# Patient Record
Sex: Male | Born: 1940
Health system: Southern US, Community
[De-identification: ages and names within clinical notes are randomized; demographics above are authoritative.]

## PROBLEM LIST (undated history)

## (undated) DIAGNOSIS — I4891 Unspecified atrial fibrillation: Secondary | ICD-10-CM

## (undated) DIAGNOSIS — I779 Disorder of arteries and arterioles, unspecified: Secondary | ICD-10-CM

## (undated) DIAGNOSIS — E785 Hyperlipidemia, unspecified: Secondary | ICD-10-CM

## (undated) DIAGNOSIS — I255 Ischemic cardiomyopathy: Secondary | ICD-10-CM

## (undated) DIAGNOSIS — I2510 Atherosclerotic heart disease of native coronary artery without angina pectoris: Secondary | ICD-10-CM

## (undated) DIAGNOSIS — Z87442 Personal history of urinary calculi: Secondary | ICD-10-CM

## (undated) DIAGNOSIS — I10 Essential (primary) hypertension: Secondary | ICD-10-CM

## (undated) DIAGNOSIS — I351 Nonrheumatic aortic (valve) insufficiency: Secondary | ICD-10-CM

## (undated) DIAGNOSIS — E119 Type 2 diabetes mellitus without complications: Secondary | ICD-10-CM

## (undated) DIAGNOSIS — I9789 Other postprocedural complications and disorders of the circulatory system, not elsewhere classified: Secondary | ICD-10-CM

## (undated) DIAGNOSIS — I493 Ventricular premature depolarization: Secondary | ICD-10-CM

## (undated) DIAGNOSIS — K219 Gastro-esophageal reflux disease without esophagitis: Secondary | ICD-10-CM

## (undated) DIAGNOSIS — IMO0001 Reserved for inherently not codable concepts without codable children: Secondary | ICD-10-CM

## (undated) DIAGNOSIS — K589 Irritable bowel syndrome without diarrhea: Secondary | ICD-10-CM

## (undated) DIAGNOSIS — I340 Nonrheumatic mitral (valve) insufficiency: Secondary | ICD-10-CM

## (undated) DIAGNOSIS — N183 Chronic kidney disease, stage 3 unspecified: Secondary | ICD-10-CM

## (undated) DIAGNOSIS — Z9289 Personal history of other medical treatment: Secondary | ICD-10-CM

## (undated) DIAGNOSIS — C801 Malignant (primary) neoplasm, unspecified: Secondary | ICD-10-CM

## (undated) DIAGNOSIS — I350 Nonrheumatic aortic (valve) stenosis: Secondary | ICD-10-CM

## (undated) DIAGNOSIS — I5032 Chronic diastolic (congestive) heart failure: Secondary | ICD-10-CM

## (undated) HISTORY — DX: Unspecified atrial fibrillation: I97.89

## (undated) HISTORY — DX: Ischemic cardiomyopathy: I25.5

## (undated) HISTORY — DX: Personal history of other medical treatment: Z92.89

## (undated) HISTORY — DX: Nonrheumatic mitral (valve) insufficiency: I34.0

## (undated) HISTORY — DX: Irritable bowel syndrome, unspecified: K58.9

## (undated) HISTORY — DX: Disorder of arteries and arterioles, unspecified: I77.9

## (undated) HISTORY — DX: Unspecified atrial fibrillation: I48.91

## (undated) HISTORY — DX: Ventricular premature depolarization: I49.3

## (undated) HISTORY — DX: Nonrheumatic aortic (valve) insufficiency: I35.1

## (undated) HISTORY — DX: Essential (primary) hypertension: I10

## (undated) HISTORY — PX: NASAL FRACTURE SURGERY: SHX718

## (undated) HISTORY — PX: CARDIOVERSION: SHX1299

## (undated) HISTORY — DX: Reserved for inherently not codable concepts without codable children: IMO0001

## (undated) HISTORY — DX: Gastro-esophageal reflux disease without esophagitis: K21.9

---

## 2001-03-22 ENCOUNTER — Ambulatory Visit (HOSPITAL_COMMUNITY): Admission: RE | Admit: 2001-03-22 | Discharge: 2001-03-22 | Payer: Self-pay | Admitting: Internal Medicine

## 2001-03-22 ENCOUNTER — Encounter: Payer: Self-pay | Admitting: Internal Medicine

## 2001-09-03 ENCOUNTER — Encounter: Payer: Self-pay | Admitting: Family Medicine

## 2001-09-03 ENCOUNTER — Ambulatory Visit (HOSPITAL_COMMUNITY): Admission: RE | Admit: 2001-09-03 | Discharge: 2001-09-03 | Payer: Self-pay | Admitting: Family Medicine

## 2002-03-24 ENCOUNTER — Encounter: Payer: Self-pay | Admitting: Family Medicine

## 2002-03-24 ENCOUNTER — Ambulatory Visit (HOSPITAL_COMMUNITY): Admission: RE | Admit: 2002-03-24 | Discharge: 2002-03-24 | Payer: Self-pay | Admitting: Family Medicine

## 2002-10-26 ENCOUNTER — Encounter: Payer: Self-pay | Admitting: Family Medicine

## 2002-10-26 ENCOUNTER — Ambulatory Visit (HOSPITAL_COMMUNITY): Admission: RE | Admit: 2002-10-26 | Discharge: 2002-10-26 | Payer: Self-pay | Admitting: Family Medicine

## 2002-12-01 DIAGNOSIS — Z87442 Personal history of urinary calculi: Secondary | ICD-10-CM

## 2002-12-01 HISTORY — DX: Personal history of urinary calculi: Z87.442

## 2004-03-25 ENCOUNTER — Ambulatory Visit (HOSPITAL_COMMUNITY): Admission: RE | Admit: 2004-03-25 | Discharge: 2004-03-25 | Payer: Self-pay | Admitting: Family Medicine

## 2004-03-28 ENCOUNTER — Ambulatory Visit (HOSPITAL_COMMUNITY): Admission: RE | Admit: 2004-03-28 | Discharge: 2004-03-28 | Payer: Self-pay | Admitting: Family Medicine

## 2007-12-28 ENCOUNTER — Ambulatory Visit (HOSPITAL_COMMUNITY): Admission: RE | Admit: 2007-12-28 | Discharge: 2007-12-28 | Payer: Self-pay | Admitting: Family Medicine

## 2008-04-10 ENCOUNTER — Ambulatory Visit (HOSPITAL_COMMUNITY): Admission: RE | Admit: 2008-04-10 | Discharge: 2008-04-10 | Payer: Self-pay | Admitting: Family Medicine

## 2008-07-05 ENCOUNTER — Ambulatory Visit (HOSPITAL_COMMUNITY): Admission: RE | Admit: 2008-07-05 | Discharge: 2008-07-05 | Payer: Self-pay | Admitting: Family Medicine

## 2010-12-22 ENCOUNTER — Encounter: Payer: Self-pay | Admitting: Family Medicine

## 2011-02-06 ENCOUNTER — Ambulatory Visit (INDEPENDENT_AMBULATORY_CARE_PROVIDER_SITE_OTHER): Payer: Medicare Other | Admitting: Internal Medicine

## 2011-02-06 DIAGNOSIS — R1032 Left lower quadrant pain: Secondary | ICD-10-CM

## 2011-02-06 DIAGNOSIS — R1031 Right lower quadrant pain: Secondary | ICD-10-CM

## 2011-02-06 DIAGNOSIS — Z7982 Long term (current) use of aspirin: Secondary | ICD-10-CM

## 2011-02-26 ENCOUNTER — Other Ambulatory Visit (INDEPENDENT_AMBULATORY_CARE_PROVIDER_SITE_OTHER): Payer: Self-pay | Admitting: Internal Medicine

## 2011-02-26 ENCOUNTER — Encounter (HOSPITAL_BASED_OUTPATIENT_CLINIC_OR_DEPARTMENT_OTHER): Payer: Medicare Other | Admitting: Internal Medicine

## 2011-02-26 ENCOUNTER — Ambulatory Visit (HOSPITAL_COMMUNITY)
Admission: RE | Admit: 2011-02-26 | Discharge: 2011-02-26 | Disposition: A | Discharge status: Home / Self Care | Payer: Medicare Other | Source: Ambulatory Visit | Attending: Internal Medicine | Admitting: Internal Medicine

## 2011-02-26 DIAGNOSIS — Z1211 Encounter for screening for malignant neoplasm of colon: Secondary | ICD-10-CM

## 2011-02-26 DIAGNOSIS — Z7982 Long term (current) use of aspirin: Secondary | ICD-10-CM | POA: Insufficient documentation

## 2011-02-26 DIAGNOSIS — D126 Benign neoplasm of colon, unspecified: Secondary | ICD-10-CM | POA: Insufficient documentation

## 2011-02-26 DIAGNOSIS — K5730 Diverticulosis of large intestine without perforation or abscess without bleeding: Secondary | ICD-10-CM

## 2011-02-26 DIAGNOSIS — E785 Hyperlipidemia, unspecified: Secondary | ICD-10-CM | POA: Insufficient documentation

## 2011-02-26 DIAGNOSIS — R109 Unspecified abdominal pain: Secondary | ICD-10-CM

## 2011-02-26 DIAGNOSIS — Z79899 Other long term (current) drug therapy: Secondary | ICD-10-CM | POA: Insufficient documentation

## 2011-02-26 DIAGNOSIS — R1031 Right lower quadrant pain: Secondary | ICD-10-CM | POA: Insufficient documentation

## 2011-02-26 DIAGNOSIS — I10 Essential (primary) hypertension: Secondary | ICD-10-CM | POA: Insufficient documentation

## 2011-02-26 DIAGNOSIS — E119 Type 2 diabetes mellitus without complications: Secondary | ICD-10-CM | POA: Insufficient documentation

## 2011-03-09 NOTE — Op Note (Signed)
  [name], [name]                ACCOUNT NO.:  [id number]  MEDICAL RECORD NO.:  [id number]           PATIENT TYPE:  O  LOCATION:  DAYP                          FACILITY:  [facility]  PHYSICIAN:  Lionel December, M.D.    DATE OF BIRTH:  24-Jan-1941  DATE OF PROCEDURE:  02/26/2011 DATE OF DISCHARGE:                              OPERATIVE REPORT   PROCEDURE:  Colonoscopy.  INDICATION:  Alejandro [name] is a 71 year old Caucasian male who has been experiencing lower abdominal pain over the last 3 months.  He also gives history of tarry stools but recently when he was seen in the office, stool was guaiac negative.  He also had stool studies which were negative except for a positive lactoferrin.  The patient's last colonoscopy was over 10 years ago.  He is undergoing colonoscopy for diagnostic and screening purposes.  Procedures were reviewed with the patient and informed consent was obtained.  MEDS FOR CONSCIOUS SEDATION:  Demerol 50 mg IV, Versed 4 mg IV.  FINDINGS:  Procedure performed in endoscopy suite.  The patient's vital signs and O2 sat were monitored during the procedure and remained stable.  The patient was placed in left lateral recumbent position and rectal examination performed.  No abnormality noted on external or digital exam.  Pentax videoscope was placed through the rectum and advanced under vision into sigmoid colon beyond.  Preparation was excellent.  He had two tiny diverticula at sigmoid colon.  Scope was passed into the cecum which was identified by appendiceal orifice and ileocecal valve.  Pictures taken for the record.  There were three small polyps at cecum, 2 were ablated via cold biopsy and submitted one container.  The other polyp was somewhat larger sessile ablated via cold biopsy and submitted in separate container.  As the scope was withdrawn, colonic mucosa was once again carefully examined.  There two more polyps at hepatic flexure, which was ablated via cold biopsy  and submitted in one container.  Mucosa, rest of the colon was normal.  Rectal mucosa similarly was normal.  Scope was retroflexed to examine, anorectal junction which was unremarkable.  Endoscope was then withdrawn. Withdrawal time was 7 minutes.  FINAL DIAGNOSIS: 1. Examination performed to cecum. 2. Five small polyps ablated via cold biopsy, three of these were at     cecum, rest at hepatic flexure.  Two small cecal polyps were     submitted one container and similarly polyps at hepatic flexure     submitted in one container.  RECOMMENDATIONS:  A standard instructions given.  Dicyclomine 10 mg before breakfast and lunch.  Prescription given for 60 with one refill.  I will be contacting the patient's results of biopsy and further recommendations.     Lionel December, M.D.     NR/MEDQ  D:  02/26/2011  T:  02/26/2011  Job:  [id number]  cc:   Catalina Pizza, M.D. Fax: [phone]  Electronically Signed by Lionel December M.D. on 03/09/2011 02:03:48 PM

## 2011-03-09 NOTE — Consult Note (Signed)
NAMEBRAXSTON, Alejandro [name]                ACCOUNT NO.:  [id number]  MEDICAL RECORD NO.:  [id number]           PATIENT TYPE:  LOCATION:                                 FACILITY:  PHYSICIAN:  Lionel December, M.D.    DATE OF BIRTH:  [birth year]  DATE OF CONSULTATION: DATE OF DISCHARGE:                                CONSULTATION   REASON FOR CONSULT:  Abdominal pain.  HISTORY OF PRESENT ILLNESS:  Alejandro [name] is a 70 year old white male presenting today with complaints that in December, he ate fish in [place], and began to have lower abdominal pain.  He denied having any diarrhea in December.  He states his stools 2 weeks ago were black.  He says he has been having black stools for about 2 weeks.  He also complained of burping which actually relieved some of his lower abdominal pain.  He says if he eats chicken noodle soup, he has no problems.  He does complain of left lower abdominal pain and pain at the umbilicus.  He feels better when he takes Metamucil.  His appetite has been good.  He has lost weight, and this has been intentional.  He usually has a bowel movement one a day or every 2 days.  His stools are normal, normal size. His lower abdominal pain is also relieved when he has a bowel movement. His acid reflux is controlled with Nexium.  He says he can eat anything. Alejandro [name] denied diarrhea. He did have stool studies in December, his O's and P's, C and S, and C. difficile were all negative.  He did have a positive lactoferritin.  There are no known allergies.  SURGERY:  He had a nasal fracture repaired years ago.  He has a history of IBS, claustrophobia, hypertension, high cholesterol, he has been a diabetic for 3 years.  FAMILY HISTORY:  His mother is deceased from complications of diabetes. His father is deceased from MI at age 62, two sisters, their health is unknown, but they are alive; two brothers both has had heart attacks. He is married.  He is self-employed in a [employer].  He does not smoke, drink, or do drugs.  He has one child who has had an MI twice.  HOME MEDICATIONS: 1. Diovan 321 a day. 2. Trilipix 135 mg a day. 3. Bystolic 5 mg a day. 4. Aspirin 81 mg a day. 5. Janumet 1 twice a day. 6. Nexium 40 mg a day. 7. Diazepam 5 mg as needed. 8. Aleve as needed.  OBJECTIVE:  VITAL SIGNS:  His weight is 284, his height is 5 feet 11 inches, his temp is 97.8, his blood pressure is 182/100, and his pulse is 80. HEENT:  His conjunctivae is pink.  His sclerae anicteric.  His thyroid is normal.  There is no cervical lymphadenopathy. LUNGS:  Clear. HEART:  Regular rate and rhythm. ABDOMEN:  Obese, soft.  Bowel sounds are positive.  No tenderness at this time.  No mass is felt.  His stool is very dark, but it was guaiac negative.  ASSESSMENT:  Alejandro [name] is a 70 year old male  presenting today with lower abdominal pain.  He states he has had black stools for the past 2 weeks, but he actually has had no upper GI symptoms.  He says his acid reflux is controlled with Nexium and he can eat anything he wants.  He does have lower abdominal pain particularly on the left.  An inflammatory disease needs to be ruled out.  There is no diarrhea at this time that he has told me.  RECOMMENDATIONS:  We will schedule a colonoscopy with Dr. Karilyn Cota in the near future and the risk and benefits were reviewed with the patient and he is agreeable and he says his last colonoscopy was over 10 years ago and I will try to find that report.    ______________________________ Alejandro Ar, NP   ______________________________ Lionel December, M.D.    TS/MEDQ  D:  02/07/2011  T:  02/08/2011  Job:  [id number]  Electronically Signed by Alejandro Ar PA on 03/04/2011 08:39:04 AM Electronically Signed by Lionel December M.D. on 03/09/2011 02:03:43 PM

## 2011-06-18 ENCOUNTER — Ambulatory Visit (INDEPENDENT_AMBULATORY_CARE_PROVIDER_SITE_OTHER): Payer: Medicare Other | Admitting: Internal Medicine

## 2011-07-03 ENCOUNTER — Ambulatory Visit (INDEPENDENT_AMBULATORY_CARE_PROVIDER_SITE_OTHER): Payer: Medicare Other | Admitting: Internal Medicine

## 2011-07-03 VITALS — BP 162/80 | HR 68 | Temp 97.1°F | Ht 67.0 in | Wt 279.0 lb

## 2011-07-03 DIAGNOSIS — K219 Gastro-esophageal reflux disease without esophagitis: Secondary | ICD-10-CM

## 2011-07-03 NOTE — Progress Notes (Signed)
Subjective:     Patient ID: Alejandro [name], male   DOB: [birth year], 70 y.o.   MRN: [id number]  HPIHe presents today with c/o. He says he cannot any red meat.  He will become nauseated and he will cramps in his stomach.  If he eats a salad his mouth will break out and will cause nausea.  Symptoms x 1 month. If he eats breakfast in about 2 hrs he will become nauseated.  15 pound weight loss since his last visit. He does tell me he is trying to loose weight. No acid reflux.  BM x  4 a day. No melena or bright red bleeding.    Colonoscopy 02/26/2011 Dr. Karilyn Cota lower abdominal pain: Biopsy 5 small tubular adenomas. TCS in 5 yrs. Review of Systems     Objective:   Physical Exam Alert and oriented. Skin warm and dry. Oral mucosa is moist. Natural teeth in good condition. Sclera anicteric, conjunctivae is pink. Thyroid not enlarged. No cervical lymphadenopathy. Lungs clear. Heart regular rate and rhythm.  Abdomen is soft. Bowel sounds are positive Tenderness to epigastric region.. No hepatomegaly Very obese.. No abdominal masses felt. Patient is alert and oriented.     Assessment:    GERD.  Symptoms may also suggest gastroparesis given hx of diabetes    Plan:     Will give samples of Dexilant and GERD diet.  Progress report in 2 weeks. If not better will proceed with an EGD.

## 2012-01-08 ENCOUNTER — Encounter (INDEPENDENT_AMBULATORY_CARE_PROVIDER_SITE_OTHER): Payer: Self-pay | Admitting: Internal Medicine

## 2012-01-08 ENCOUNTER — Encounter (INDEPENDENT_AMBULATORY_CARE_PROVIDER_SITE_OTHER): Payer: Self-pay | Admitting: *Deleted

## 2012-01-08 ENCOUNTER — Ambulatory Visit (INDEPENDENT_AMBULATORY_CARE_PROVIDER_SITE_OTHER): Payer: Medicare Other | Admitting: Internal Medicine

## 2012-01-08 ENCOUNTER — Other Ambulatory Visit (INDEPENDENT_AMBULATORY_CARE_PROVIDER_SITE_OTHER): Payer: Self-pay | Admitting: *Deleted

## 2012-01-08 VITALS — BP 154/86 | HR 80 | Temp 97.9°F | Ht 73.0 in | Wt 282.0 lb

## 2012-01-08 DIAGNOSIS — K219 Gastro-esophageal reflux disease without esophagitis: Secondary | ICD-10-CM | POA: Diagnosis not present

## 2012-01-08 DIAGNOSIS — E119 Type 2 diabetes mellitus without complications: Secondary | ICD-10-CM | POA: Insufficient documentation

## 2012-01-08 DIAGNOSIS — R1013 Epigastric pain: Secondary | ICD-10-CM | POA: Diagnosis not present

## 2012-01-08 DIAGNOSIS — I10 Essential (primary) hypertension: Secondary | ICD-10-CM | POA: Insufficient documentation

## 2012-01-08 DIAGNOSIS — K921 Melena: Secondary | ICD-10-CM | POA: Diagnosis not present

## 2012-01-08 DIAGNOSIS — E7800 Pure hypercholesterolemia, unspecified: Secondary | ICD-10-CM | POA: Insufficient documentation

## 2012-01-08 NOTE — Patient Instructions (Signed)
Will schedule an EGD with Dr Rehman.  

## 2012-01-08 NOTE — Progress Notes (Signed)
Subjective:     Patient ID: Alejandro [name], male   DOB: 12-18-1940, 71 y.o.   MRN: [id number]  HPI  Alejandro [name]  Is here today for f/u. He tells me today his stools are black. He says his stools were black x 2 days about 2 weeks ago.  He said he ate fried apple pies and that upset his stomach. He says he had epigastric pain and pain left upper abdomen. His mid abdomen feels like it is burning.  He is taking Dexilant which helps. Appetite good. No weight loss. Usually has a BM about once a day. Stools are green now in color. He does have epigastric and left mid abdominal pain.   He denies dysphagia  Colonosocpy 01/2011: Tubular adenoma.  Negative for high agrade dysplasia. ( For hx of lower abdominal pain. He gave a hx of tarry stools but this resolved)  Review of Systems See hpi Current Outpatient Prescriptions  Medication Sig Dispense Refill  . aspirin 81 MG tablet Take 81 mg by mouth daily.        Marland Kitchen dexlansoprazole (DEXILANT) 60 MG capsule Take 60 mg by mouth daily.      . naproxen sodium (ANAPROX) 220 MG tablet Take 220 mg by mouth as needed.        . nebivolol (BYSTOLIC) 5 MG tablet Take 5 mg by mouth daily.        . sitaGLIPtan-metformin (JANUMET) 50-1000 MG per tablet Take 1 tablet by mouth 2 (two) times daily with a meal.        . valsartan (DIOVAN) 320 MG tablet Take 320 mg by mouth daily.        . diazepam (VALIUM) 5 MG tablet Take 5 mg by mouth every 6 (six) hours as needed.        Marland Kitchen esomeprazole (NEXIUM) 40 MG capsule Take 40 mg by mouth daily before breakfast.         No family status information on file.       Objective:   Physical Exam Filed Vitals:   01/08/12 1132  Height: 6\' 1"  (1.854 m)  Weight: 282 lb (127.914 kg)    Alert and oriented. Skin warm and dry. Oral mucosa is moist.   . Sclera anicteric, conjunctivae is pink. Thyroid not enlarged. No cervical lymphadenopathy. Lungs clear. Heart regular rate and rhythm.  Abdomen is soft. Bowel sounds are positive. Abdomen is obese.  Unable to palpate liver. No abdominal masses felt. No tenderness.  No edema to lower extremities. Patient is alert and oriented.      Assessment:   PUD needs to be ruled out.     Plan:    Will schedule and EGD with Dr. Karilyn Cota. Gerd diet reviewed with patient.

## 2012-02-02 ENCOUNTER — Encounter (HOSPITAL_COMMUNITY): Payer: Self-pay | Admitting: Pharmacy Technician

## 2012-02-03 MED ORDER — SODIUM CHLORIDE 0.45 % IV SOLN
Freq: Once | INTRAVENOUS | Status: AC
  Administered 2012-02-04: 1000 mL via INTRAVENOUS

## 2012-02-04 ENCOUNTER — Encounter (HOSPITAL_COMMUNITY): Payer: Self-pay | Admitting: *Deleted

## 2012-02-04 ENCOUNTER — Encounter (HOSPITAL_COMMUNITY)
Admission: RE | Disposition: A | Discharge status: Home / Self Care | Payer: Self-pay | Source: Ambulatory Visit | Attending: Internal Medicine

## 2012-02-04 ENCOUNTER — Ambulatory Visit (HOSPITAL_COMMUNITY)
Admission: RE | Admit: 2012-02-04 | Discharge: 2012-02-04 | Disposition: A | Discharge status: Home / Self Care | Payer: Medicare Other | Source: Ambulatory Visit | Attending: Internal Medicine | Admitting: Internal Medicine

## 2012-02-04 DIAGNOSIS — E7800 Pure hypercholesterolemia, unspecified: Secondary | ICD-10-CM | POA: Diagnosis not present

## 2012-02-04 DIAGNOSIS — K219 Gastro-esophageal reflux disease without esophagitis: Secondary | ICD-10-CM | POA: Diagnosis not present

## 2012-02-04 DIAGNOSIS — E119 Type 2 diabetes mellitus without complications: Secondary | ICD-10-CM | POA: Insufficient documentation

## 2012-02-04 DIAGNOSIS — K228 Other specified diseases of esophagus: Secondary | ICD-10-CM

## 2012-02-04 DIAGNOSIS — R1013 Epigastric pain: Secondary | ICD-10-CM

## 2012-02-04 DIAGNOSIS — K208 Other esophagitis: Secondary | ICD-10-CM

## 2012-02-04 DIAGNOSIS — Z01812 Encounter for preprocedural laboratory examination: Secondary | ICD-10-CM | POA: Insufficient documentation

## 2012-02-04 DIAGNOSIS — K921 Melena: Secondary | ICD-10-CM

## 2012-02-04 DIAGNOSIS — I10 Essential (primary) hypertension: Secondary | ICD-10-CM | POA: Insufficient documentation

## 2012-02-04 DIAGNOSIS — K6389 Other specified diseases of intestine: Secondary | ICD-10-CM

## 2012-02-04 DIAGNOSIS — K2940 Chronic atrophic gastritis without bleeding: Secondary | ICD-10-CM | POA: Diagnosis not present

## 2012-02-04 HISTORY — PX: ESOPHAGOGASTRODUODENOSCOPY: SHX5428

## 2012-02-04 LAB — GLUCOSE, CAPILLARY: Glucose-Capillary: 110 mg/dL — ABNORMAL HIGH (ref 70–99)

## 2012-02-04 SURGERY — EGD (ESOPHAGOGASTRODUODENOSCOPY)
Anesthesia: Moderate Sedation

## 2012-02-04 MED ORDER — MIDAZOLAM HCL 5 MG/5ML IJ SOLN
INTRAMUSCULAR | Status: AC
  Filled 2012-02-04: qty 10

## 2012-02-04 MED ORDER — MIDAZOLAM HCL 5 MG/5ML IJ SOLN
INTRAMUSCULAR | Status: DC | PRN
  Administered 2012-02-04: 1 mg via INTRAVENOUS
  Administered 2012-02-04 (×3): 2 mg via INTRAVENOUS

## 2012-02-04 MED ORDER — MEPERIDINE HCL 50 MG/ML IJ SOLN
INTRAMUSCULAR | Status: AC
  Filled 2012-02-04: qty 1

## 2012-02-04 MED ORDER — BUTAMBEN-TETRACAINE-BENZOCAINE 2-2-14 % EX AERO
INHALATION_SPRAY | CUTANEOUS | Status: DC | PRN
  Administered 2012-02-04: 1 via TOPICAL

## 2012-02-04 MED ORDER — MEPERIDINE HCL 25 MG/ML IJ SOLN
INTRAMUSCULAR | Status: DC | PRN
  Administered 2012-02-04 (×2): 25 mg via INTRAVENOUS

## 2012-02-04 MED ORDER — STERILE WATER FOR IRRIGATION IR SOLN
Status: DC | PRN
  Administered 2012-02-04: 13:00:00

## 2012-02-04 NOTE — Discharge Instructions (Signed)
Resume usual medications and diet. No over-the-counter Arthritis medications. No driving for [id number]. Physician will contact you with results of biopsy and blood work. Office will arrange for upper abdominal ultrasound.

## 2012-02-04 NOTE — Op Note (Signed)
EGD PROCEDURE REPORT  PATIENT:  Alejandro [name]  MR#:  [id number] Birthdate:  Jul 16, 1941, 71 y.o., male Endoscopist:  Dr. Malissa Hippo, MD Referred By:  Dr. Dwana Melena, MD. Procedure Date: 02/04/2012  Procedure:   EGD.  Indications:  Patient is 71 year old Caucasian male to experience 2 episodes of melena recently and also has intermittent epigastric pain. He is undergoing diagnostic EGD.            Informed Consent:  Procedure and risks were reviewed with the patient and informed consent was obtained.  Medications:  Demerol 50 mg IV Versed 7 mg IV Cetacaine spray topically for oropharyngeal anesthesia  Description of procedure:  The endoscope was introduced through the mouth and advanced to the second portion of the duodenum without difficulty or limitations. The mucosal surfaces were surveyed very carefully during advancement of the scope and upon withdrawal.  Findings:  Esophagus:  Mucosa of the esophagus was normal. Wavy GE junction but no erosions or stricture noted. GEJ:  42 cm Stomach:  Stomach was empty and distended very well with insufflation. Folds in the proximal stomach were normal. Examination of the mucosa revealed patchy area edema and was a pattern in gastric body. Multiple antral erosions were noted in the background of raised,edematous mucosa but no ulcer crater identified. Angularis, fundus and cardia examined by retroflexing the scope and were unremarkable. No gastric varices identified. Duodenum:  Bulbar mucosa was normal. There appeared to be areas with flat mucosa involving second part of the duodenum. Biopsy was taken from these areas for routine histology.  Therapeutic/Diagnostic Maneuvers Performed:  See above  Complications:  None  Impression: Mucosal changes involving gastric body suspicious for portal gastropathy. Erosive antral gastritis. Patchy flattening to post bulbar mucosa. See taken for routine histology.  Recommendations:  Continue PPI. No OTC  NSAIDs. H. pylori serology. Hepatobiliary ultrasound.  Niemah Schwebke U  02/04/2012  1:16 PM  CC: Dr. Dwana Melena, MD, MD & Dr. Bonnetta Barry ref. provider found

## 2012-02-04 NOTE — H&P (Signed)
This is an update to history and physical from 01/08/2012. Patient has not experienced any more episodes of melena. He does complain of intermittent soreness in both iliac fossae as well as midabdomen when he has this symptom he does not have any nausea vomiting diarrhea fever or chills. His last colonoscopy was in March 2012 with removal of multiple small tubular adenomas(5). He is undergoing diagnostic EGD.

## 2012-02-05 LAB — H. PYLORI ANTIBODY, IGG: H Pylori IgG: >8 {ISR} — ABNORMAL HIGH

## 2012-02-06 ENCOUNTER — Other Ambulatory Visit (INDEPENDENT_AMBULATORY_CARE_PROVIDER_SITE_OTHER): Payer: Self-pay | Admitting: *Deleted

## 2012-02-06 DIAGNOSIS — R1013 Epigastric pain: Secondary | ICD-10-CM

## 2012-02-09 ENCOUNTER — Ambulatory Visit (HOSPITAL_COMMUNITY): Payer: Medicare Other

## 2012-02-09 ENCOUNTER — Encounter (HOSPITAL_COMMUNITY): Payer: Self-pay | Admitting: Internal Medicine

## 2012-02-09 ENCOUNTER — Other Ambulatory Visit (INDEPENDENT_AMBULATORY_CARE_PROVIDER_SITE_OTHER): Payer: Self-pay | Admitting: Internal Medicine

## 2012-02-09 DIAGNOSIS — B9681 Helicobacter pylori [H. pylori] as the cause of diseases classified elsewhere: Secondary | ICD-10-CM

## 2012-02-09 MED ORDER — BIS SUBCIT-METRONID-TETRACYC 140-125-125 MG PO CAPS: 3.0000 | ORAL_CAPSULE | Freq: Three times a day (TID) | ORAL | Status: AC

## 2012-02-12 ENCOUNTER — Ambulatory Visit (HOSPITAL_COMMUNITY)
Admission: RE | Admit: 2012-02-12 | Discharge: 2012-02-12 | Disposition: A | Discharge status: Home / Self Care | Payer: Medicare Other | Source: Ambulatory Visit | Attending: Internal Medicine | Admitting: Internal Medicine

## 2012-02-12 DIAGNOSIS — R1013 Epigastric pain: Secondary | ICD-10-CM | POA: Diagnosis not present

## 2012-02-12 DIAGNOSIS — K7689 Other specified diseases of liver: Secondary | ICD-10-CM | POA: Insufficient documentation

## 2012-02-26 DIAGNOSIS — J069 Acute upper respiratory infection, unspecified: Secondary | ICD-10-CM | POA: Diagnosis not present

## 2012-04-02 DIAGNOSIS — IMO0001 Reserved for inherently not codable concepts without codable children: Secondary | ICD-10-CM | POA: Diagnosis not present

## 2012-04-02 DIAGNOSIS — E291 Testicular hypofunction: Secondary | ICD-10-CM | POA: Diagnosis not present

## 2012-04-27 DIAGNOSIS — E291 Testicular hypofunction: Secondary | ICD-10-CM | POA: Diagnosis not present

## 2012-05-18 DIAGNOSIS — E291 Testicular hypofunction: Secondary | ICD-10-CM | POA: Diagnosis not present

## 2012-06-08 DIAGNOSIS — E291 Testicular hypofunction: Secondary | ICD-10-CM | POA: Diagnosis not present

## 2012-06-21 DIAGNOSIS — M549 Dorsalgia, unspecified: Secondary | ICD-10-CM | POA: Diagnosis not present

## 2012-06-21 DIAGNOSIS — N419 Inflammatory disease of prostate, unspecified: Secondary | ICD-10-CM | POA: Diagnosis not present

## 2012-06-22 DIAGNOSIS — M549 Dorsalgia, unspecified: Secondary | ICD-10-CM | POA: Diagnosis not present

## 2012-06-29 DIAGNOSIS — E291 Testicular hypofunction: Secondary | ICD-10-CM | POA: Diagnosis not present

## 2012-07-20 DIAGNOSIS — E291 Testicular hypofunction: Secondary | ICD-10-CM | POA: Diagnosis not present

## 2012-08-03 DIAGNOSIS — L259 Unspecified contact dermatitis, unspecified cause: Secondary | ICD-10-CM | POA: Diagnosis not present

## 2012-08-03 DIAGNOSIS — IMO0001 Reserved for inherently not codable concepts without codable children: Secondary | ICD-10-CM | POA: Diagnosis not present

## 2012-08-12 DIAGNOSIS — E291 Testicular hypofunction: Secondary | ICD-10-CM | POA: Diagnosis not present

## 2012-08-17 DIAGNOSIS — Z23 Encounter for immunization: Secondary | ICD-10-CM | POA: Diagnosis not present

## 2012-08-30 DIAGNOSIS — M779 Enthesopathy, unspecified: Secondary | ICD-10-CM | POA: Diagnosis not present

## 2012-08-30 DIAGNOSIS — I10 Essential (primary) hypertension: Secondary | ICD-10-CM | POA: Diagnosis not present

## 2012-09-02 ENCOUNTER — Encounter (INDEPENDENT_AMBULATORY_CARE_PROVIDER_SITE_OTHER): Payer: Self-pay | Admitting: Internal Medicine

## 2012-09-02 ENCOUNTER — Ambulatory Visit (INDEPENDENT_AMBULATORY_CARE_PROVIDER_SITE_OTHER): Payer: Medicare Other | Admitting: Internal Medicine

## 2012-09-02 VITALS — BP 160/80 | HR 64 | Temp 98.0°F | Ht 71.0 in | Wt 275.6 lb

## 2012-09-02 DIAGNOSIS — A048 Other specified bacterial intestinal infections: Secondary | ICD-10-CM | POA: Insufficient documentation

## 2012-09-02 DIAGNOSIS — K219 Gastro-esophageal reflux disease without esophagitis: Secondary | ICD-10-CM | POA: Diagnosis not present

## 2012-09-02 NOTE — Patient Instructions (Addendum)
OV in 1 yr;Marland Kitchen Continue Dexilant. If any problems, please call our office.

## 2012-09-02 NOTE — Progress Notes (Signed)
Subjective:     Patient ID: Alejandro [name], male   DOB: March 19, 1941, 71 y.o.   MRN: [id number]  HPIHere today for f/u of GERD.Feels pretty good. Appetite is good. No weight loss. Hx of H. Pylori positive. No epigastric pain. Usually has a BM about 2-3 a day. No melena or bright red rectal bleeding. Sometimes his stools are green.  02/04/2012 [id number]:[id number]:  Mucosal changes involving gastric body suspicious for portal gastropathy.  Erosive antral gastritis.  Patchy flattening to post bulbar mucosa. See taken for routine histology.  Biopsy:  Normal small bowel mucosa. H. pylori serology positive. Ultrasound is pending. Will start patient Pylera 3 capsules 4 times a day for 10 days.  US abdomen 02/06/2012 for epigastric pain: Fatty infiltration of the liver (hepatic steatosis).  Normal gallbladder. Negative for gallstones or biliary dilatation.     Review of Systems see hpi Current Outpatient Prescriptions  Medication Sig Dispense Refill  . aspirin EC 81 MG tablet Take 81 mg by mouth daily.      . cloNIDine (CATAPRES) 0.1 MG tablet Take 0.1 mg by mouth daily.      Marland Kitchen dexlansoprazole (DEXILANT) 60 MG capsule Take 60 mg by mouth daily.      . diazepam (VALIUM) 5 MG tablet Take 5 mg by mouth every 6 (six) hours as needed. For anxiety      . hydrALAZINE (APRESOLINE) 25 MG tablet Take 25 mg by mouth daily.      . nebivolol (BYSTOLIC) 5 MG tablet Take 5 mg by mouth daily.        . sitaGLIPtan-metformin (JANUMET) 50-1000 MG per tablet Take 1 tablet by mouth 2 (two) times daily with a meal.        . valsartan (DIOVAN) 320 MG tablet Take 320 mg by mouth daily.         Past Medical History  Diagnosis Date  . Diabetes mellitus   . Hypertension   . IBS (irritable colon syndrome)   . High cholesterol   . Hypertension   . Chronic kidney disease 2004    kidney stones   Past Surgical History  Procedure Date  . Nasal fx   . Esophagogastroduodenoscopy 02/04/2012    Procedure:  ESOPHAGOGASTRODUODENOSCOPY (EGD);  Surgeon: Malissa Hippo, MD;  Location: [facility];  Service: Endoscopy;  Laterality: N/A;  100   History   Social History  . Marital Status: Married    Spouse Name: N/A    Number of Children: N/A  . Years of Education: N/A   Occupational History  . Not on file.   Social History Main Topics  . Smoking status: Never Smoker   . Smokeless tobacco: Not on file  . Alcohol Use: No  . Drug Use: No  . Sexually Active: Yes   Other Topics Concern  . Not on file   Social History Narrative  . No narrative on file   Family Status  Relation Status Death Age  . Mother Deceased     Diabetes  . Father Deceased     MI  . Sister Alive     health unknown  . Brother Alive     Both have had MIs   No Known Allergies      Objective:   Physical Exam Filed Vitals:   09/02/12 0952  BP: 160/80  Pulse: 64  Temp: 98 F (36.7 C)  Height: 5\' 11"  (1.803 m)  Weight: 275 lb 9.6 oz (125.011 kg)   Alert and oriented.  Skin warm and dry. Oral mucosa is moist.   . Sclera anicteric, conjunctivae is pink. Thyroid not enlarged. No cervical lymphadenopathy. Lungs clear. Heart regular rate and rhythm.  Abdomen is soft. Bowel sounds are positive. No hepatomegaly. No abdominal masses felt. No tenderness.  No edema to lower extremities.       Assessment:    GERD. H pylori positive. Patient is asymptomatic . Feels good.    Plan:    f/u one year

## 2012-09-17 DIAGNOSIS — J069 Acute upper respiratory infection, unspecified: Secondary | ICD-10-CM | POA: Diagnosis not present

## 2012-09-22 DIAGNOSIS — E291 Testicular hypofunction: Secondary | ICD-10-CM | POA: Diagnosis not present

## 2012-09-22 DIAGNOSIS — J069 Acute upper respiratory infection, unspecified: Secondary | ICD-10-CM | POA: Diagnosis not present

## 2012-10-15 DIAGNOSIS — M62838 Other muscle spasm: Secondary | ICD-10-CM | POA: Diagnosis not present

## 2012-10-15 DIAGNOSIS — R51 Headache: Secondary | ICD-10-CM | POA: Diagnosis not present

## 2012-10-21 DIAGNOSIS — E291 Testicular hypofunction: Secondary | ICD-10-CM | POA: Diagnosis not present

## 2012-11-15 DIAGNOSIS — E291 Testicular hypofunction: Secondary | ICD-10-CM | POA: Diagnosis not present

## 2012-12-02 DIAGNOSIS — H9209 Otalgia, unspecified ear: Secondary | ICD-10-CM | POA: Diagnosis not present

## 2012-12-02 DIAGNOSIS — J019 Acute sinusitis, unspecified: Secondary | ICD-10-CM | POA: Diagnosis not present

## 2012-12-10 ENCOUNTER — Telehealth (INDEPENDENT_AMBULATORY_CARE_PROVIDER_SITE_OTHER): Payer: Self-pay | Admitting: Internal Medicine

## 2012-12-10 DIAGNOSIS — K219 Gastro-esophageal reflux disease without esophagitis: Secondary | ICD-10-CM

## 2012-12-10 MED ORDER — DEXLANSOPRAZOLE 60 MG PO CPDR: 60.0000 mg | DELAYED_RELEASE_CAPSULE | Freq: Every day | ORAL | Status: DC

## 2012-12-10 NOTE — Telephone Encounter (Signed)
Rx for dexilant eprescribed

## 2012-12-23 DIAGNOSIS — E291 Testicular hypofunction: Secondary | ICD-10-CM | POA: Diagnosis not present

## 2013-01-12 DIAGNOSIS — E291 Testicular hypofunction: Secondary | ICD-10-CM | POA: Diagnosis not present

## 2013-01-20 ENCOUNTER — Telehealth (INDEPENDENT_AMBULATORY_CARE_PROVIDER_SITE_OTHER): Payer: Self-pay | Admitting: *Deleted

## 2013-01-20 NOTE — Telephone Encounter (Signed)
Patient needed Dexilant to be approved. [employer] [facility] and they approved Dexilant 60 mg - Take 1 by mouth daily #30  From today, date until 11-30-13. [facility] called and made aware/Linda.

## 2013-02-01 DIAGNOSIS — E291 Testicular hypofunction: Secondary | ICD-10-CM | POA: Diagnosis not present

## 2013-02-02 ENCOUNTER — Telehealth (INDEPENDENT_AMBULATORY_CARE_PROVIDER_SITE_OTHER): Payer: Self-pay | Admitting: Internal Medicine

## 2013-02-02 DIAGNOSIS — K625 Hemorrhage of anus and rectum: Secondary | ICD-10-CM

## 2013-02-02 NOTE — Telephone Encounter (Signed)
Having a small amt of rectal bleeding per wife. I did not speak with patient. Last colonoscopy in 2012. Will check a CBC on him today

## 2013-02-07 DIAGNOSIS — K625 Hemorrhage of anus and rectum: Secondary | ICD-10-CM | POA: Diagnosis not present

## 2013-02-07 LAB — CBC WITH DIFFERENTIAL/PLATELET
Basophils Absolute: 0 10*3/uL (ref 0.0–0.1)
Basophils Relative: 0 % (ref 0–1)
HCT: 45.5 % (ref 39.0–52.0)
Lymphocytes Relative: 29 % (ref 12–46)
MCHC: 35.2 g/dL (ref 30.0–36.0)
Monocytes Absolute: 1 10*3/uL (ref 0.1–1.0)
Neutro Abs: 6 10*3/uL (ref 1.7–7.7)
Neutrophils Relative %: 60 % (ref 43–77)
Platelets: 248 10*3/uL (ref 150–400)
RDW: 13.8 % (ref 11.5–15.5)
WBC: 9.9 10*3/uL (ref 4.0–10.5)

## 2013-02-08 ENCOUNTER — Telehealth (INDEPENDENT_AMBULATORY_CARE_PROVIDER_SITE_OTHER): Payer: Self-pay | Admitting: Internal Medicine

## 2013-02-08 NOTE — Telephone Encounter (Signed)
Results of lab given to patient. No further bleeding. If any problems call our office.

## 2013-02-11 DIAGNOSIS — J019 Acute sinusitis, unspecified: Secondary | ICD-10-CM | POA: Diagnosis not present

## 2013-02-14 DIAGNOSIS — R070 Pain in throat: Secondary | ICD-10-CM | POA: Diagnosis not present

## 2013-02-14 DIAGNOSIS — N419 Inflammatory disease of prostate, unspecified: Secondary | ICD-10-CM | POA: Diagnosis not present

## 2013-02-23 DIAGNOSIS — E291 Testicular hypofunction: Secondary | ICD-10-CM | POA: Diagnosis not present

## 2013-02-25 DIAGNOSIS — R300 Dysuria: Secondary | ICD-10-CM | POA: Diagnosis not present

## 2013-03-01 ENCOUNTER — Ambulatory Visit (INDEPENDENT_AMBULATORY_CARE_PROVIDER_SITE_OTHER): Payer: Medicare Other | Admitting: Orthopedic Surgery

## 2013-03-01 ENCOUNTER — Encounter: Payer: Self-pay | Admitting: Orthopedic Surgery

## 2013-03-01 ENCOUNTER — Ambulatory Visit (INDEPENDENT_AMBULATORY_CARE_PROVIDER_SITE_OTHER): Payer: Medicare Other

## 2013-03-01 VITALS — BP 160/92 | Ht 71.0 in | Wt 280.0 lb

## 2013-03-01 DIAGNOSIS — M79609 Pain in unspecified limb: Secondary | ICD-10-CM

## 2013-03-01 DIAGNOSIS — M79604 Pain in right leg: Secondary | ICD-10-CM

## 2013-03-01 DIAGNOSIS — M48062 Spinal stenosis, lumbar region with neurogenic claudication: Secondary | ICD-10-CM | POA: Diagnosis not present

## 2013-03-01 DIAGNOSIS — M79605 Pain in left leg: Secondary | ICD-10-CM

## 2013-03-01 MED ORDER — GABAPENTIN 100 MG PO CAPS: 100.0000 mg | ORAL_CAPSULE | Freq: Three times a day (TID) | ORAL | Status: DC

## 2013-03-01 NOTE — Progress Notes (Signed)
Patient ID: Alejandro [name], male   DOB: 08/29/1941, 72 y.o.   MRN: [id number] Chief Complaint  Patient presents with  . Leg Pain    Bilateral leg pain, no injury.    History  This is a 72 year old male with a history of diabetes and hypertension mild acid reflux who presents with bilateral leg and knee pain behind his legs gradual onset started after he did some shoveling no previous history of back pain describes the pain as throbbing 9/10 constant getting better since it initially started and it is associated with some numbness worse with walking improved with recumbency pain occurs all the time  Only surgery was for broken nose  Pharmacy is [facility]  Primary care physician Dr. 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 BP 160/92  Ht 5\' 11"  (1.803 m)  Wt 280 lb (127.007 kg)  BMI 39.07 kg/m2  Mild obesity  Ambulates normally  His legs look normal other than some mild varus has a slight flexion contracture on the left full range of motion of that hip and knee bilaterally no instability in the knee joints motor exam normal skin intact good pulses normal sensation reflexes intact mild pain and tenderness right lower lumbar area  XRAYS: ORDERED AND I HAVE READ THEM AS FOLLOWS:   X-rays show spondylolisthesis grade 2  Degenerative disease  Spinal stenosis  Recommend Neurontin 100 mg 3 times a day followup one month

## 2013-03-02 ENCOUNTER — Encounter (INDEPENDENT_AMBULATORY_CARE_PROVIDER_SITE_OTHER): Payer: Self-pay | Admitting: Internal Medicine

## 2013-03-02 ENCOUNTER — Ambulatory Visit (INDEPENDENT_AMBULATORY_CARE_PROVIDER_SITE_OTHER): Payer: Medicare Other | Admitting: Internal Medicine

## 2013-03-02 VITALS — BP 140/86 | HR 64 | Ht 71.0 in | Wt 284.2 lb

## 2013-03-02 DIAGNOSIS — K921 Melena: Secondary | ICD-10-CM | POA: Diagnosis not present

## 2013-03-02 NOTE — Patient Instructions (Addendum)
F/u as needed. If any further problems, call our office. Will check a CBC today.

## 2013-03-02 NOTE — Progress Notes (Signed)
Subjective:     Patient ID: Alejandro [name], male   DOB: 24-Jun-1941, 72 y.o.   MRN: [id number]  HPI Says he took a cold. Treated for sinusitis. Started on an antibiotic by his PCP Dr. Margo Aye.  He says it turned his stool green, then black after the antibiotic. This occurred 3 weeks ago. Stools are brown now.  Thinks the Rx was Levaquin. Appetite is good. No problems now. Soreness in lower abdomen.  Colonoscopy 02/26/2011;  Five small polyps ablated via cold biopsy. Biopsy: Colon, hepatic flexure: tubular adenomas x 2 . Negative for high grade dysplasia or malignancy. Colon, cecal: tubular adenoma. Negative for high grade dysplasia or malignancy. Colon, cecal: tubular adenoma. Negative for high grade dysplasia or malignancy.   Hx of H. Pylori positive.  Usually has a BM about 2-3 a day. No melena or bright red rectal bleeding. Sometimes his stools are green.  02/04/2012 JXB:[id number]:  Mucosal changes involving gastric body suspicious for portal gastropathy.  Erosive antral gastritis.  Patchy flattening to post bulbar mucosa. See taken for routine histology.  Biopsy:  Normal small bowel mucosa. H. pylori serology positive. Ultrasound is pending. Will start patient Pylera 3 capsules 4 times a day for 10 days.  US abdomen 02/06/2012 for epigastric pain: Fatty infiltration of the liver (hepatic steatosis).  Normal gallbladder. Negative for gallstones or biliary dilatation.   CBC    Component Value Date/Time   WBC 9.9 02/07/2013 0716   RBC 5.32 02/07/2013 0716   HGB 16.0 02/07/2013 0716   HCT 45.5 02/07/2013 0716   PLT 248 02/07/2013 0716   MCV 85.5 02/07/2013 0716   MCH 30.1 02/07/2013 0716   MCHC 35.2 02/07/2013 0716   RDW 13.8 02/07/2013 0716   LYMPHSABS 2.9 02/07/2013 0716   MONOABS 1.0 02/07/2013 0716   EOSABS 0.1 02/07/2013 0716   BASOSABS 0.0 02/07/2013 0716       Review of Systems     Current Outpatient Prescriptions  Medication Sig Dispense Refill  . aspirin EC 81 MG tablet Take  81 mg by mouth daily.      . cloNIDine (CATAPRES) 0.1 MG tablet Take 0.1 mg by mouth daily.      Marland Kitchen dexlansoprazole (DEXILANT) 60 MG capsule Take 1 capsule (60 mg total) by mouth daily.  30 capsule  4  . diazepam (VALIUM) 5 MG tablet Take 5 mg by mouth every 6 (six) hours as needed. For anxiety      . gabapentin (NEURONTIN) 100 MG capsule Take 1 capsule (100 mg total) by mouth 3 (three) times daily.  90 capsule  2  . hydrALAZINE (APRESOLINE) 25 MG tablet Take 25 mg by mouth daily.      . nebivolol (BYSTOLIC) 5 MG tablet Take 5 mg by mouth daily.        . sitaGLIPtan-metformin (JANUMET) 50-1000 MG per tablet Take 1 tablet by mouth 2 (two) times daily with a meal.        . valsartan (DIOVAN) 320 MG tablet Take 320 mg by mouth daily.         No current facility-administered medications for this visit.   Past Medical History  Diagnosis Date  . Diabetes mellitus   . Hypertension   . IBS (irritable colon syndrome)   . High cholesterol   . Hypertension   . Chronic kidney disease 2004    kidney stones  . Reflux    Past Surgical History  Procedure Laterality Date  . Nasal fx    .  Esophagogastroduodenoscopy  02/04/2012    Procedure: ESOPHAGOGASTRODUODENOSCOPY (EGD);  Surgeon: Malissa Hippo, MD;  Location: [facility];  Service: Endoscopy;  Laterality: N/A;  100   No Known Allergies  Objective:   Physical Exam There were no vitals filed for this visit. Filed Vitals:   03/02/13 1602  BP: 140/86  Pulse: 64  Height: 5\' 11"  (1.803 m)  Weight: 284 lb 3.2 oz (128.912 kg)  Alert and oriented. Skin warm and dry. Oral mucosa is moist.   . Sclera anicteric, conjunctivae is pink. Thyroid not enlarged. No cervical lymphadenopathy. Lungs clear. Heart regular rate and rhythm.  Abdomen is soft. Bowel sounds are positive. No hepatomegaly. No abdominal masses felt. No tenderness.  No edema to lower extremities.   Stool brown and guaiac negative today.     Assessment:    Possible melena. Stools are  normal now. Occurred after taking antibiotics.    Plan:     CBC today. Continue the Dexilant. Will call tomorrow with results.

## 2013-03-15 DIAGNOSIS — M5430 Sciatica, unspecified side: Secondary | ICD-10-CM | POA: Diagnosis not present

## 2013-03-15 DIAGNOSIS — M999 Biomechanical lesion, unspecified: Secondary | ICD-10-CM | POA: Diagnosis not present

## 2013-03-15 DIAGNOSIS — M545 Low back pain: Secondary | ICD-10-CM | POA: Diagnosis not present

## 2013-03-16 DIAGNOSIS — M5430 Sciatica, unspecified side: Secondary | ICD-10-CM | POA: Diagnosis not present

## 2013-03-16 DIAGNOSIS — M9981 Other biomechanical lesions of cervical region: Secondary | ICD-10-CM | POA: Diagnosis not present

## 2013-03-16 DIAGNOSIS — E291 Testicular hypofunction: Secondary | ICD-10-CM | POA: Diagnosis not present

## 2013-03-16 DIAGNOSIS — M999 Biomechanical lesion, unspecified: Secondary | ICD-10-CM | POA: Diagnosis not present

## 2013-03-17 DIAGNOSIS — M9981 Other biomechanical lesions of cervical region: Secondary | ICD-10-CM | POA: Diagnosis not present

## 2013-03-17 DIAGNOSIS — M999 Biomechanical lesion, unspecified: Secondary | ICD-10-CM | POA: Diagnosis not present

## 2013-03-17 DIAGNOSIS — M5430 Sciatica, unspecified side: Secondary | ICD-10-CM | POA: Diagnosis not present

## 2013-03-21 DIAGNOSIS — M9981 Other biomechanical lesions of cervical region: Secondary | ICD-10-CM | POA: Diagnosis not present

## 2013-03-21 DIAGNOSIS — M999 Biomechanical lesion, unspecified: Secondary | ICD-10-CM | POA: Diagnosis not present

## 2013-03-21 DIAGNOSIS — M5430 Sciatica, unspecified side: Secondary | ICD-10-CM | POA: Diagnosis not present

## 2013-03-23 DIAGNOSIS — M5430 Sciatica, unspecified side: Secondary | ICD-10-CM | POA: Diagnosis not present

## 2013-03-23 DIAGNOSIS — M9981 Other biomechanical lesions of cervical region: Secondary | ICD-10-CM | POA: Diagnosis not present

## 2013-03-23 DIAGNOSIS — M999 Biomechanical lesion, unspecified: Secondary | ICD-10-CM | POA: Diagnosis not present

## 2013-03-25 DIAGNOSIS — M9981 Other biomechanical lesions of cervical region: Secondary | ICD-10-CM | POA: Diagnosis not present

## 2013-03-25 DIAGNOSIS — M999 Biomechanical lesion, unspecified: Secondary | ICD-10-CM | POA: Diagnosis not present

## 2013-03-25 DIAGNOSIS — M5430 Sciatica, unspecified side: Secondary | ICD-10-CM | POA: Diagnosis not present

## 2013-03-28 DIAGNOSIS — M9981 Other biomechanical lesions of cervical region: Secondary | ICD-10-CM | POA: Diagnosis not present

## 2013-03-28 DIAGNOSIS — M5430 Sciatica, unspecified side: Secondary | ICD-10-CM | POA: Diagnosis not present

## 2013-03-28 DIAGNOSIS — M999 Biomechanical lesion, unspecified: Secondary | ICD-10-CM | POA: Diagnosis not present

## 2013-03-29 ENCOUNTER — Ambulatory Visit (INDEPENDENT_AMBULATORY_CARE_PROVIDER_SITE_OTHER): Payer: Medicare Other | Admitting: Orthopedic Surgery

## 2013-03-29 ENCOUNTER — Encounter: Payer: Self-pay | Admitting: Orthopedic Surgery

## 2013-03-29 VITALS — Ht 71.0 in | Wt 280.0 lb

## 2013-03-29 DIAGNOSIS — E1149 Type 2 diabetes mellitus with other diabetic neurological complication: Secondary | ICD-10-CM

## 2013-03-29 DIAGNOSIS — M48061 Spinal stenosis, lumbar region without neurogenic claudication: Secondary | ICD-10-CM | POA: Diagnosis not present

## 2013-03-29 DIAGNOSIS — E1142 Type 2 diabetes mellitus with diabetic polyneuropathy: Secondary | ICD-10-CM

## 2013-03-29 DIAGNOSIS — E1140 Type 2 diabetes mellitus with diabetic neuropathy, unspecified: Secondary | ICD-10-CM | POA: Insufficient documentation

## 2013-03-29 MED ORDER — AMITRIPTYLINE HCL 150 MG PO TABS: 75.0000 mg | ORAL_TABLET | Freq: Every day | ORAL | Status: DC

## 2013-03-29 NOTE — Patient Instructions (Addendum)
Start amitriptyline 1 at night  If any side effects let us know

## 2013-03-29 NOTE — Progress Notes (Signed)
Patient ID: Alejandro [name], male   DOB: [birth year], 72 y.o.   MRN: [id number] Paraspinal stenosis  Recheck status post initiation of gabapentin therapy  Patient could not tolerate medication  It made him feel funny he couldn't sleep  Continues to complain of bilateral foot pain and bilateral leg pain  Chiropractor manipulation seems to help his back pain with Dr. Ladona Ridgel as the chiropractor  He has some peripheral edema bilaterally with tenderness he also has some anterior compartment tenderness on the left which I believe is neurogenic  He is awake alert and oriented x3 his mood and affect is normal exam is deferred with no assistive devices overall appearance is normal  His skin on his legs exhibits changes of venous stasis he does have peripheral edema with intact pulses  Recommend chiropractic manipulation as needed  Try amitriptyline 75 mg at night for one month and followup for reevaluation

## 2013-03-30 DIAGNOSIS — M5430 Sciatica, unspecified side: Secondary | ICD-10-CM | POA: Diagnosis not present

## 2013-03-30 DIAGNOSIS — M9981 Other biomechanical lesions of cervical region: Secondary | ICD-10-CM | POA: Diagnosis not present

## 2013-03-30 DIAGNOSIS — M999 Biomechanical lesion, unspecified: Secondary | ICD-10-CM | POA: Diagnosis not present

## 2013-04-01 DIAGNOSIS — M5430 Sciatica, unspecified side: Secondary | ICD-10-CM | POA: Diagnosis not present

## 2013-04-01 DIAGNOSIS — M9981 Other biomechanical lesions of cervical region: Secondary | ICD-10-CM | POA: Diagnosis not present

## 2013-04-01 DIAGNOSIS — M999 Biomechanical lesion, unspecified: Secondary | ICD-10-CM | POA: Diagnosis not present

## 2013-04-06 DIAGNOSIS — E291 Testicular hypofunction: Secondary | ICD-10-CM | POA: Diagnosis not present

## 2013-04-11 DIAGNOSIS — M9981 Other biomechanical lesions of cervical region: Secondary | ICD-10-CM | POA: Diagnosis not present

## 2013-04-11 DIAGNOSIS — M5430 Sciatica, unspecified side: Secondary | ICD-10-CM | POA: Diagnosis not present

## 2013-04-11 DIAGNOSIS — M999 Biomechanical lesion, unspecified: Secondary | ICD-10-CM | POA: Diagnosis not present

## 2013-04-15 DIAGNOSIS — M9981 Other biomechanical lesions of cervical region: Secondary | ICD-10-CM | POA: Diagnosis not present

## 2013-04-15 DIAGNOSIS — M999 Biomechanical lesion, unspecified: Secondary | ICD-10-CM | POA: Diagnosis not present

## 2013-04-15 DIAGNOSIS — M5430 Sciatica, unspecified side: Secondary | ICD-10-CM | POA: Diagnosis not present

## 2013-04-22 DIAGNOSIS — M9981 Other biomechanical lesions of cervical region: Secondary | ICD-10-CM | POA: Diagnosis not present

## 2013-04-22 DIAGNOSIS — M999 Biomechanical lesion, unspecified: Secondary | ICD-10-CM | POA: Diagnosis not present

## 2013-04-22 DIAGNOSIS — M5430 Sciatica, unspecified side: Secondary | ICD-10-CM | POA: Diagnosis not present

## 2013-04-27 DIAGNOSIS — E291 Testicular hypofunction: Secondary | ICD-10-CM | POA: Diagnosis not present

## 2013-04-28 ENCOUNTER — Encounter: Payer: Self-pay | Admitting: Orthopedic Surgery

## 2013-04-28 ENCOUNTER — Ambulatory Visit: Payer: Medicare Other | Admitting: Orthopedic Surgery

## 2013-04-29 DIAGNOSIS — M9981 Other biomechanical lesions of cervical region: Secondary | ICD-10-CM | POA: Diagnosis not present

## 2013-04-29 DIAGNOSIS — M5430 Sciatica, unspecified side: Secondary | ICD-10-CM | POA: Diagnosis not present

## 2013-04-29 DIAGNOSIS — M999 Biomechanical lesion, unspecified: Secondary | ICD-10-CM | POA: Diagnosis not present

## 2013-05-11 DIAGNOSIS — E291 Testicular hypofunction: Secondary | ICD-10-CM | POA: Diagnosis not present

## 2013-05-18 ENCOUNTER — Other Ambulatory Visit (INDEPENDENT_AMBULATORY_CARE_PROVIDER_SITE_OTHER): Payer: Self-pay | Admitting: Internal Medicine

## 2013-05-25 DIAGNOSIS — M5430 Sciatica, unspecified side: Secondary | ICD-10-CM | POA: Diagnosis not present

## 2013-05-25 DIAGNOSIS — M9981 Other biomechanical lesions of cervical region: Secondary | ICD-10-CM | POA: Diagnosis not present

## 2013-05-25 DIAGNOSIS — M999 Biomechanical lesion, unspecified: Secondary | ICD-10-CM | POA: Diagnosis not present

## 2013-06-01 DIAGNOSIS — E291 Testicular hypofunction: Secondary | ICD-10-CM | POA: Diagnosis not present

## 2013-06-14 ENCOUNTER — Encounter: Payer: Self-pay | Admitting: Orthopedic Surgery

## 2013-06-14 ENCOUNTER — Other Ambulatory Visit: Payer: Self-pay | Admitting: *Deleted

## 2013-06-14 ENCOUNTER — Ambulatory Visit (INDEPENDENT_AMBULATORY_CARE_PROVIDER_SITE_OTHER): Payer: Medicare Other | Admitting: Orthopedic Surgery

## 2013-06-14 VITALS — BP 146/100 | Ht 71.0 in | Wt 280.0 lb

## 2013-06-14 DIAGNOSIS — E1142 Type 2 diabetes mellitus with diabetic polyneuropathy: Secondary | ICD-10-CM | POA: Diagnosis not present

## 2013-06-14 DIAGNOSIS — M48061 Spinal stenosis, lumbar region without neurogenic claudication: Secondary | ICD-10-CM | POA: Diagnosis not present

## 2013-06-14 DIAGNOSIS — E1149 Type 2 diabetes mellitus with other diabetic neurological complication: Secondary | ICD-10-CM | POA: Diagnosis not present

## 2013-06-14 DIAGNOSIS — E1140 Type 2 diabetes mellitus with diabetic neuropathy, unspecified: Secondary | ICD-10-CM

## 2013-06-14 MED ORDER — AMITRIPTYLINE HCL 150 MG PO TABS: 75.0000 mg | ORAL_TABLET | Freq: Every day | ORAL | Status: DC

## 2013-06-14 NOTE — Patient Instructions (Addendum)
Nerve conduction study   Start amytriptiline 150 mg at night

## 2013-06-14 NOTE — Progress Notes (Signed)
Patient ID: LUCIEN BUDNEY, male   DOB: 04-14-1941, 72 y.o.   MRN: [id number] Chief Complaint  Patient presents with  . Follow-up    Recheck legs and feet    History: Mr. Korol has had a course of Neurontin and has not improved continues with foot pain numbness and tingling from his knee down to his foot, his back pain is essentially gone. The numbness and tingling seem to occur after he is sitting with his knee flexed. Is also having difficulty with shoe wear and pain on the bottom or plantar aspect of his feet despite multiple changes in shoes.  Review of systems negative  A recommend amitriptyline 150 mg each bedtime. I will like to get I nerve study done to assess his neurologic function looking for diabetic peripheral neuropathy demyelinating neuropathy B12 B6 deficiencies

## 2013-06-15 ENCOUNTER — Telehealth: Payer: Self-pay | Admitting: *Deleted

## 2013-06-15 ENCOUNTER — Other Ambulatory Visit: Payer: Self-pay | Admitting: *Deleted

## 2013-06-15 DIAGNOSIS — E1140 Type 2 diabetes mellitus with diabetic neuropathy, unspecified: Secondary | ICD-10-CM

## 2013-06-15 NOTE — Telephone Encounter (Signed)
Faxed referral and office notes to Dr. Ronal Fear office for Nerve Conduction Study. Awaiting appointment.

## 2013-06-20 DIAGNOSIS — M999 Biomechanical lesion, unspecified: Secondary | ICD-10-CM | POA: Diagnosis not present

## 2013-06-20 DIAGNOSIS — M5430 Sciatica, unspecified side: Secondary | ICD-10-CM | POA: Diagnosis not present

## 2013-06-20 DIAGNOSIS — M9981 Other biomechanical lesions of cervical region: Secondary | ICD-10-CM | POA: Diagnosis not present

## 2013-06-20 NOTE — Telephone Encounter (Signed)
Appointment with Dr.[place]oonquah 06/21/13 at 12:00 pm. Patient aware.

## 2013-06-21 DIAGNOSIS — E1149 Type 2 diabetes mellitus with other diabetic neurological complication: Secondary | ICD-10-CM | POA: Diagnosis not present

## 2013-06-21 DIAGNOSIS — E1142 Type 2 diabetes mellitus with diabetic polyneuropathy: Secondary | ICD-10-CM | POA: Diagnosis not present

## 2013-06-21 DIAGNOSIS — M79609 Pain in unspecified limb: Secondary | ICD-10-CM | POA: Diagnosis not present

## 2013-06-22 DIAGNOSIS — E291 Testicular hypofunction: Secondary | ICD-10-CM | POA: Diagnosis not present

## 2013-06-24 DIAGNOSIS — M999 Biomechanical lesion, unspecified: Secondary | ICD-10-CM | POA: Diagnosis not present

## 2013-06-24 DIAGNOSIS — M5430 Sciatica, unspecified side: Secondary | ICD-10-CM | POA: Diagnosis not present

## 2013-06-24 DIAGNOSIS — M9981 Other biomechanical lesions of cervical region: Secondary | ICD-10-CM | POA: Diagnosis not present

## 2013-07-01 DIAGNOSIS — M9981 Other biomechanical lesions of cervical region: Secondary | ICD-10-CM | POA: Diagnosis not present

## 2013-07-01 DIAGNOSIS — M999 Biomechanical lesion, unspecified: Secondary | ICD-10-CM | POA: Diagnosis not present

## 2013-07-01 DIAGNOSIS — M5430 Sciatica, unspecified side: Secondary | ICD-10-CM | POA: Diagnosis not present

## 2013-07-08 DIAGNOSIS — M999 Biomechanical lesion, unspecified: Secondary | ICD-10-CM | POA: Diagnosis not present

## 2013-07-08 DIAGNOSIS — M9981 Other biomechanical lesions of cervical region: Secondary | ICD-10-CM | POA: Diagnosis not present

## 2013-07-08 DIAGNOSIS — M5430 Sciatica, unspecified side: Secondary | ICD-10-CM | POA: Diagnosis not present

## 2013-07-12 DIAGNOSIS — E291 Testicular hypofunction: Secondary | ICD-10-CM | POA: Diagnosis not present

## 2013-07-14 ENCOUNTER — Encounter: Payer: Self-pay | Admitting: Orthopedic Surgery

## 2013-07-14 ENCOUNTER — Ambulatory Visit: Payer: Medicare Other | Admitting: Orthopedic Surgery

## 2013-07-15 DIAGNOSIS — M9981 Other biomechanical lesions of cervical region: Secondary | ICD-10-CM | POA: Diagnosis not present

## 2013-07-15 DIAGNOSIS — M5430 Sciatica, unspecified side: Secondary | ICD-10-CM | POA: Diagnosis not present

## 2013-07-15 DIAGNOSIS — M999 Biomechanical lesion, unspecified: Secondary | ICD-10-CM | POA: Diagnosis not present

## 2013-07-29 DIAGNOSIS — M5430 Sciatica, unspecified side: Secondary | ICD-10-CM | POA: Diagnosis not present

## 2013-07-29 DIAGNOSIS — M999 Biomechanical lesion, unspecified: Secondary | ICD-10-CM | POA: Diagnosis not present

## 2013-07-29 DIAGNOSIS — M545 Low back pain: Secondary | ICD-10-CM | POA: Diagnosis not present

## 2013-08-02 DIAGNOSIS — Z23 Encounter for immunization: Secondary | ICD-10-CM | POA: Diagnosis not present

## 2013-08-02 DIAGNOSIS — E785 Hyperlipidemia, unspecified: Secondary | ICD-10-CM | POA: Diagnosis not present

## 2013-08-02 DIAGNOSIS — E119 Type 2 diabetes mellitus without complications: Secondary | ICD-10-CM | POA: Diagnosis not present

## 2013-08-02 DIAGNOSIS — I10 Essential (primary) hypertension: Secondary | ICD-10-CM | POA: Diagnosis not present

## 2013-08-02 DIAGNOSIS — E291 Testicular hypofunction: Secondary | ICD-10-CM | POA: Diagnosis not present

## 2013-08-02 DIAGNOSIS — Z125 Encounter for screening for malignant neoplasm of prostate: Secondary | ICD-10-CM | POA: Diagnosis not present

## 2013-08-12 DIAGNOSIS — M9981 Other biomechanical lesions of cervical region: Secondary | ICD-10-CM | POA: Diagnosis not present

## 2013-08-12 DIAGNOSIS — M5430 Sciatica, unspecified side: Secondary | ICD-10-CM | POA: Diagnosis not present

## 2013-08-12 DIAGNOSIS — M999 Biomechanical lesion, unspecified: Secondary | ICD-10-CM | POA: Diagnosis not present

## 2013-08-22 DIAGNOSIS — E291 Testicular hypofunction: Secondary | ICD-10-CM | POA: Diagnosis not present

## 2013-09-12 DIAGNOSIS — E291 Testicular hypofunction: Secondary | ICD-10-CM | POA: Diagnosis not present

## 2013-09-13 ENCOUNTER — Encounter (INDEPENDENT_AMBULATORY_CARE_PROVIDER_SITE_OTHER): Payer: Self-pay | Admitting: *Deleted

## 2013-10-03 DIAGNOSIS — E291 Testicular hypofunction: Secondary | ICD-10-CM | POA: Diagnosis not present

## 2013-11-11 DIAGNOSIS — E291 Testicular hypofunction: Secondary | ICD-10-CM | POA: Diagnosis not present

## 2013-11-21 ENCOUNTER — Other Ambulatory Visit (INDEPENDENT_AMBULATORY_CARE_PROVIDER_SITE_OTHER): Payer: Self-pay | Admitting: Internal Medicine

## 2013-11-21 DIAGNOSIS — M5430 Sciatica, unspecified side: Secondary | ICD-10-CM | POA: Diagnosis not present

## 2013-11-21 DIAGNOSIS — M999 Biomechanical lesion, unspecified: Secondary | ICD-10-CM | POA: Diagnosis not present

## 2013-11-21 DIAGNOSIS — M545 Low back pain: Secondary | ICD-10-CM | POA: Diagnosis not present

## 2013-11-23 DIAGNOSIS — M9981 Other biomechanical lesions of cervical region: Secondary | ICD-10-CM | POA: Diagnosis not present

## 2013-11-23 DIAGNOSIS — M5430 Sciatica, unspecified side: Secondary | ICD-10-CM | POA: Diagnosis not present

## 2013-11-23 DIAGNOSIS — M999 Biomechanical lesion, unspecified: Secondary | ICD-10-CM | POA: Diagnosis not present

## 2013-12-09 DIAGNOSIS — E291 Testicular hypofunction: Secondary | ICD-10-CM | POA: Diagnosis not present

## 2013-12-13 DIAGNOSIS — M999 Biomechanical lesion, unspecified: Secondary | ICD-10-CM | POA: Diagnosis not present

## 2013-12-13 DIAGNOSIS — M9981 Other biomechanical lesions of cervical region: Secondary | ICD-10-CM | POA: Diagnosis not present

## 2013-12-13 DIAGNOSIS — M5430 Sciatica, unspecified side: Secondary | ICD-10-CM | POA: Diagnosis not present

## 2013-12-16 DIAGNOSIS — M9981 Other biomechanical lesions of cervical region: Secondary | ICD-10-CM | POA: Diagnosis not present

## 2013-12-16 DIAGNOSIS — M999 Biomechanical lesion, unspecified: Secondary | ICD-10-CM | POA: Diagnosis not present

## 2013-12-16 DIAGNOSIS — M5430 Sciatica, unspecified side: Secondary | ICD-10-CM | POA: Diagnosis not present

## 2013-12-20 ENCOUNTER — Encounter (INDEPENDENT_AMBULATORY_CARE_PROVIDER_SITE_OTHER): Payer: Self-pay | Admitting: Internal Medicine

## 2013-12-20 ENCOUNTER — Ambulatory Visit (INDEPENDENT_AMBULATORY_CARE_PROVIDER_SITE_OTHER): Payer: Medicare Other | Admitting: Internal Medicine

## 2013-12-20 VITALS — BP 158/90 | HR 72 | Temp 97.8°F | Ht 71.0 in | Wt 287.8 lb

## 2013-12-20 DIAGNOSIS — K219 Gastro-esophageal reflux disease without esophagitis: Secondary | ICD-10-CM | POA: Diagnosis not present
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
## 2013-12-20 NOTE — Patient Instructions (Addendum)
carafate samples. OV 1 yr with Dr. Gracy Racer. PR in a couple of weeks.

## 2013-12-26 DIAGNOSIS — M5430 Sciatica, unspecified side: Secondary | ICD-10-CM | POA: Diagnosis not present

## 2013-12-26 DIAGNOSIS — M999 Biomechanical lesion, unspecified: Secondary | ICD-10-CM | POA: Diagnosis not present

## 2013-12-26 DIAGNOSIS — M9981 Other biomechanical lesions of cervical region: Secondary | ICD-10-CM | POA: Diagnosis not present

## 2014-01-02 DIAGNOSIS — E291 Testicular hypofunction: Secondary | ICD-10-CM | POA: Diagnosis not present

## 2014-01-23 DIAGNOSIS — M999 Biomechanical lesion, unspecified: Secondary | ICD-10-CM | POA: Diagnosis not present

## 2014-01-23 DIAGNOSIS — E291 Testicular hypofunction: Secondary | ICD-10-CM | POA: Diagnosis not present

## 2014-01-23 DIAGNOSIS — M9981 Other biomechanical lesions of cervical region: Secondary | ICD-10-CM | POA: Diagnosis not present

## 2014-01-23 DIAGNOSIS — M5430 Sciatica, unspecified side: Secondary | ICD-10-CM | POA: Diagnosis not present

## 2014-02-07 DIAGNOSIS — J069 Acute upper respiratory infection, unspecified: Secondary | ICD-10-CM | POA: Diagnosis not present

## 2014-02-13 DIAGNOSIS — E291 Testicular hypofunction: Secondary | ICD-10-CM | POA: Diagnosis not present

## 2014-03-01 DIAGNOSIS — J209 Acute bronchitis, unspecified: Secondary | ICD-10-CM | POA: Diagnosis not present

## 2014-03-06 ENCOUNTER — Ambulatory Visit (INDEPENDENT_AMBULATORY_CARE_PROVIDER_SITE_OTHER): Payer: Medicare Other | Admitting: Internal Medicine

## 2014-03-07 DIAGNOSIS — E291 Testicular hypofunction: Secondary | ICD-10-CM | POA: Diagnosis not present

## 2014-03-10 DIAGNOSIS — IMO0001 Reserved for inherently not codable concepts without codable children: Secondary | ICD-10-CM | POA: Diagnosis not present

## 2014-03-24 DIAGNOSIS — B372 Candidiasis of skin and nail: Secondary | ICD-10-CM | POA: Diagnosis not present

## 2014-03-30 DIAGNOSIS — E291 Testicular hypofunction: Secondary | ICD-10-CM | POA: Diagnosis not present

## 2014-04-03 DIAGNOSIS — M545 Low back pain, unspecified: Secondary | ICD-10-CM | POA: Diagnosis not present

## 2014-04-03 DIAGNOSIS — M5430 Sciatica, unspecified side: Secondary | ICD-10-CM | POA: Diagnosis not present

## 2014-04-03 DIAGNOSIS — M999 Biomechanical lesion, unspecified: Secondary | ICD-10-CM | POA: Diagnosis not present

## 2014-04-07 DIAGNOSIS — M9981 Other biomechanical lesions of cervical region: Secondary | ICD-10-CM | POA: Diagnosis not present

## 2014-04-07 DIAGNOSIS — M5430 Sciatica, unspecified side: Secondary | ICD-10-CM | POA: Diagnosis not present

## 2014-04-07 DIAGNOSIS — M999 Biomechanical lesion, unspecified: Secondary | ICD-10-CM | POA: Diagnosis not present

## 2014-04-10 DIAGNOSIS — M999 Biomechanical lesion, unspecified: Secondary | ICD-10-CM | POA: Diagnosis not present

## 2014-04-10 DIAGNOSIS — M5430 Sciatica, unspecified side: Secondary | ICD-10-CM | POA: Diagnosis not present

## 2014-04-10 DIAGNOSIS — M9981 Other biomechanical lesions of cervical region: Secondary | ICD-10-CM | POA: Diagnosis not present

## 2014-04-20 DIAGNOSIS — E291 Testicular hypofunction: Secondary | ICD-10-CM | POA: Diagnosis not present

## 2014-05-01 DIAGNOSIS — M9981 Other biomechanical lesions of cervical region: Secondary | ICD-10-CM | POA: Diagnosis not present

## 2014-05-01 DIAGNOSIS — M5430 Sciatica, unspecified side: Secondary | ICD-10-CM | POA: Diagnosis not present

## 2014-05-01 DIAGNOSIS — M999 Biomechanical lesion, unspecified: Secondary | ICD-10-CM | POA: Diagnosis not present

## 2014-05-11 DIAGNOSIS — E291 Testicular hypofunction: Secondary | ICD-10-CM | POA: Diagnosis not present

## 2014-05-13 ENCOUNTER — Encounter (HOSPITAL_COMMUNITY): Payer: Self-pay | Admitting: Emergency Medicine

## 2014-05-13 ENCOUNTER — Emergency Department (HOSPITAL_COMMUNITY)
Admission: EM | Admit: 2014-05-13 | Discharge: 2014-05-13 | Disposition: A | Discharge status: Home / Self Care | Payer: Medicare Other | Attending: Emergency Medicine | Admitting: Emergency Medicine

## 2014-05-13 ENCOUNTER — Emergency Department (HOSPITAL_COMMUNITY): Payer: Medicare Other

## 2014-05-13 DIAGNOSIS — S2249XA Multiple fractures of ribs, unspecified side, initial encounter for closed fracture: Secondary | ICD-10-CM | POA: Insufficient documentation

## 2014-05-13 DIAGNOSIS — S0003XA Contusion of scalp, initial encounter: Secondary | ICD-10-CM | POA: Insufficient documentation

## 2014-05-13 DIAGNOSIS — I10 Essential (primary) hypertension: Secondary | ICD-10-CM | POA: Insufficient documentation

## 2014-05-13 DIAGNOSIS — S0100XA Unspecified open wound of scalp, initial encounter: Secondary | ICD-10-CM | POA: Diagnosis not present

## 2014-05-13 DIAGNOSIS — S2239XA Fracture of one rib, unspecified side, initial encounter for closed fracture: Secondary | ICD-10-CM | POA: Diagnosis not present

## 2014-05-13 DIAGNOSIS — J9383 Other pneumothorax: Secondary | ICD-10-CM | POA: Diagnosis not present

## 2014-05-13 DIAGNOSIS — IMO0002 Reserved for concepts with insufficient information to code with codable children: Secondary | ICD-10-CM | POA: Insufficient documentation

## 2014-05-13 DIAGNOSIS — Z79899 Other long term (current) drug therapy: Secondary | ICD-10-CM | POA: Diagnosis not present

## 2014-05-13 DIAGNOSIS — Z7982 Long term (current) use of aspirin: Secondary | ICD-10-CM | POA: Insufficient documentation

## 2014-05-13 DIAGNOSIS — T148XXA Other injury of unspecified body region, initial encounter: Secondary | ICD-10-CM | POA: Insufficient documentation

## 2014-05-13 DIAGNOSIS — E119 Type 2 diabetes mellitus without complications: Secondary | ICD-10-CM | POA: Diagnosis not present

## 2014-05-13 DIAGNOSIS — Z87442 Personal history of urinary calculi: Secondary | ICD-10-CM | POA: Insufficient documentation

## 2014-05-13 DIAGNOSIS — S298XXA Other specified injuries of thorax, initial encounter: Secondary | ICD-10-CM | POA: Diagnosis not present

## 2014-05-13 DIAGNOSIS — S0083XA Contusion of other part of head, initial encounter: Secondary | ICD-10-CM | POA: Insufficient documentation

## 2014-05-13 DIAGNOSIS — N189 Chronic kidney disease, unspecified: Secondary | ICD-10-CM | POA: Insufficient documentation

## 2014-05-13 DIAGNOSIS — S1093XA Contusion of unspecified part of neck, initial encounter: Secondary | ICD-10-CM | POA: Diagnosis not present

## 2014-05-13 MED ORDER — DOUBLE ANTIBIOTIC 500-10000 UNIT/GM EX OINT
TOPICAL_OINTMENT | Freq: Once | CUTANEOUS | Status: AC
  Administered 2014-05-13: 15:00:00 via TOPICAL
  Filled 2014-05-13: qty 1

## 2014-05-13 MED ORDER — OXYCODONE-ACETAMINOPHEN 5-325 MG PO TABS: 1.0000 | ORAL_TABLET | Freq: Four times a day (QID) | ORAL | Status: DC | PRN

## 2014-05-13 MED ORDER — OXYCODONE-ACETAMINOPHEN 5-325 MG PO TABS
2.0000 | ORAL_TABLET | Freq: Once | ORAL | Status: AC
  Administered 2014-05-13: 2 via ORAL
  Filled 2014-05-13: qty 2
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
## 2014-05-13 NOTE — ED Notes (Signed)
[facility] in to talk with the Pt, as requested by the EDP.

## 2014-05-13 NOTE — ED Notes (Signed)
Wound care given to abrasion to forehead, arm and knee. Bandage applied. Pt tolerated without difficulty.
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
## 2014-05-15 DIAGNOSIS — J9383 Other pneumothorax: Secondary | ICD-10-CM | POA: Diagnosis not present

## 2014-05-15 DIAGNOSIS — L0390 Cellulitis, unspecified: Secondary | ICD-10-CM | POA: Diagnosis not present

## 2014-05-15 DIAGNOSIS — L0291 Cutaneous abscess, unspecified: Secondary | ICD-10-CM | POA: Diagnosis not present

## 2014-05-15 DIAGNOSIS — S2239XA Fracture of one rib, unspecified side, initial encounter for closed fracture: Secondary | ICD-10-CM | POA: Diagnosis not present

## 2014-05-18 ENCOUNTER — Ambulatory Visit (HOSPITAL_COMMUNITY)
Admission: RE | Admit: 2014-05-18 | Discharge: 2014-05-18 | Disposition: A | Discharge status: Home / Self Care | Payer: Medicare Other | Source: Ambulatory Visit | Attending: Internal Medicine | Admitting: Internal Medicine

## 2014-05-18 ENCOUNTER — Other Ambulatory Visit (HOSPITAL_COMMUNITY): Payer: Self-pay | Admitting: Internal Medicine

## 2014-05-18 DIAGNOSIS — Y929 Unspecified place or not applicable: Secondary | ICD-10-CM | POA: Insufficient documentation

## 2014-05-18 DIAGNOSIS — S298XXA Other specified injuries of thorax, initial encounter: Secondary | ICD-10-CM | POA: Diagnosis not present

## 2014-05-18 DIAGNOSIS — S2239XA Fracture of one rib, unspecified side, initial encounter for closed fracture: Secondary | ICD-10-CM | POA: Insufficient documentation

## 2014-05-18 DIAGNOSIS — J939 Pneumothorax, unspecified: Secondary | ICD-10-CM

## 2014-05-18 DIAGNOSIS — X58XXXA Exposure to other specified factors, initial encounter: Secondary | ICD-10-CM | POA: Insufficient documentation

## 2014-05-25 ENCOUNTER — Other Ambulatory Visit (INDEPENDENT_AMBULATORY_CARE_PROVIDER_SITE_OTHER): Payer: Self-pay | Admitting: Internal Medicine

## 2014-05-25 NOTE — Telephone Encounter (Signed)
Per Dr.Rehman may fill with 5 refills 

## 2014-06-01 DIAGNOSIS — E291 Testicular hypofunction: Secondary | ICD-10-CM | POA: Diagnosis not present

## 2014-06-15 DIAGNOSIS — E291 Testicular hypofunction: Secondary | ICD-10-CM | POA: Diagnosis not present

## 2014-07-06 DIAGNOSIS — E291 Testicular hypofunction: Secondary | ICD-10-CM | POA: Diagnosis not present

## 2014-07-12 DIAGNOSIS — Z125 Encounter for screening for malignant neoplasm of prostate: Secondary | ICD-10-CM | POA: Diagnosis not present

## 2014-07-12 DIAGNOSIS — I10 Essential (primary) hypertension: Secondary | ICD-10-CM | POA: Diagnosis not present

## 2014-07-12 DIAGNOSIS — E119 Type 2 diabetes mellitus without complications: Secondary | ICD-10-CM | POA: Diagnosis not present

## 2014-07-12 DIAGNOSIS — E291 Testicular hypofunction: Secondary | ICD-10-CM | POA: Diagnosis not present

## 2014-07-12 DIAGNOSIS — E785 Hyperlipidemia, unspecified: Secondary | ICD-10-CM | POA: Diagnosis not present

## 2014-07-27 DIAGNOSIS — R7301 Impaired fasting glucose: Secondary | ICD-10-CM | POA: Diagnosis not present

## 2014-07-27 DIAGNOSIS — E291 Testicular hypofunction: Secondary | ICD-10-CM | POA: Diagnosis not present

## 2014-08-17 DIAGNOSIS — Z23 Encounter for immunization: Secondary | ICD-10-CM | POA: Diagnosis not present

## 2014-08-17 DIAGNOSIS — E291 Testicular hypofunction: Secondary | ICD-10-CM | POA: Diagnosis not present

## 2014-08-25 DIAGNOSIS — M545 Low back pain, unspecified: Secondary | ICD-10-CM | POA: Diagnosis not present

## 2014-08-25 DIAGNOSIS — M999 Biomechanical lesion, unspecified: Secondary | ICD-10-CM | POA: Diagnosis not present

## 2014-08-25 DIAGNOSIS — M5430 Sciatica, unspecified side: Secondary | ICD-10-CM | POA: Diagnosis not present

## 2014-09-07 DIAGNOSIS — E291 Testicular hypofunction: Secondary | ICD-10-CM | POA: Diagnosis not present

## 2014-09-29 DIAGNOSIS — H9209 Otalgia, unspecified ear: Secondary | ICD-10-CM | POA: Diagnosis not present

## 2014-09-29 DIAGNOSIS — E291 Testicular hypofunction: Secondary | ICD-10-CM | POA: Diagnosis not present

## 2014-10-19 ENCOUNTER — Encounter (INDEPENDENT_AMBULATORY_CARE_PROVIDER_SITE_OTHER): Payer: Self-pay | Admitting: *Deleted

## 2014-10-25 DIAGNOSIS — E1165 Type 2 diabetes mellitus with hyperglycemia: Secondary | ICD-10-CM | POA: Diagnosis not present

## 2014-10-25 DIAGNOSIS — I10 Essential (primary) hypertension: Secondary | ICD-10-CM | POA: Diagnosis not present

## 2014-10-25 DIAGNOSIS — E119 Type 2 diabetes mellitus without complications: Secondary | ICD-10-CM | POA: Diagnosis not present

## 2014-10-25 DIAGNOSIS — Z125 Encounter for screening for malignant neoplasm of prostate: Secondary | ICD-10-CM | POA: Diagnosis not present

## 2014-10-25 DIAGNOSIS — R350 Frequency of micturition: Secondary | ICD-10-CM | POA: Diagnosis not present

## 2014-10-30 DIAGNOSIS — M9905 Segmental and somatic dysfunction of pelvic region: Secondary | ICD-10-CM | POA: Diagnosis not present

## 2014-10-30 DIAGNOSIS — M9903 Segmental and somatic dysfunction of lumbar region: Secondary | ICD-10-CM | POA: Diagnosis not present

## 2014-10-30 DIAGNOSIS — M5441 Lumbago with sciatica, right side: Secondary | ICD-10-CM | POA: Diagnosis not present

## 2014-10-30 DIAGNOSIS — M5136 Other intervertebral disc degeneration, lumbar region: Secondary | ICD-10-CM | POA: Diagnosis not present

## 2014-11-15 DIAGNOSIS — E291 Testicular hypofunction: Secondary | ICD-10-CM | POA: Diagnosis not present

## 2014-11-21 DIAGNOSIS — M5136 Other intervertebral disc degeneration, lumbar region: Secondary | ICD-10-CM | POA: Diagnosis not present

## 2014-11-21 DIAGNOSIS — M9905 Segmental and somatic dysfunction of pelvic region: Secondary | ICD-10-CM | POA: Diagnosis not present

## 2014-11-21 DIAGNOSIS — M9903 Segmental and somatic dysfunction of lumbar region: Secondary | ICD-10-CM | POA: Diagnosis not present

## 2014-11-21 DIAGNOSIS — M5441 Lumbago with sciatica, right side: Secondary | ICD-10-CM | POA: Diagnosis not present

## 2014-11-22 DIAGNOSIS — M9905 Segmental and somatic dysfunction of pelvic region: Secondary | ICD-10-CM | POA: Diagnosis not present

## 2014-11-22 DIAGNOSIS — M9903 Segmental and somatic dysfunction of lumbar region: Secondary | ICD-10-CM | POA: Diagnosis not present

## 2014-11-22 DIAGNOSIS — M5136 Other intervertebral disc degeneration, lumbar region: Secondary | ICD-10-CM | POA: Diagnosis not present

## 2014-11-22 DIAGNOSIS — M5441 Lumbago with sciatica, right side: Secondary | ICD-10-CM | POA: Diagnosis not present

## 2014-12-06 DIAGNOSIS — E291 Testicular hypofunction: Secondary | ICD-10-CM | POA: Diagnosis not present

## 2014-12-25 ENCOUNTER — Other Ambulatory Visit (INDEPENDENT_AMBULATORY_CARE_PROVIDER_SITE_OTHER): Payer: Self-pay | Admitting: Internal Medicine

## 2014-12-27 DIAGNOSIS — E291 Testicular hypofunction: Secondary | ICD-10-CM | POA: Diagnosis not present

## 2015-01-04 DIAGNOSIS — M9903 Segmental and somatic dysfunction of lumbar region: Secondary | ICD-10-CM | POA: Diagnosis not present

## 2015-01-04 DIAGNOSIS — M5441 Lumbago with sciatica, right side: Secondary | ICD-10-CM | POA: Diagnosis not present

## 2015-01-04 DIAGNOSIS — M9905 Segmental and somatic dysfunction of pelvic region: Secondary | ICD-10-CM | POA: Diagnosis not present

## 2015-01-04 DIAGNOSIS — M5136 Other intervertebral disc degeneration, lumbar region: Secondary | ICD-10-CM | POA: Diagnosis not present

## 2015-01-16 DIAGNOSIS — E291 Testicular hypofunction: Secondary | ICD-10-CM | POA: Diagnosis not present

## 2015-01-23 ENCOUNTER — Ambulatory Visit (INDEPENDENT_AMBULATORY_CARE_PROVIDER_SITE_OTHER): Payer: Medicare Other | Admitting: Internal Medicine

## 2015-01-23 ENCOUNTER — Encounter (INDEPENDENT_AMBULATORY_CARE_PROVIDER_SITE_OTHER): Payer: Self-pay | Admitting: Internal Medicine

## 2015-01-23 VITALS — BP 140/92 | HR 78 | Temp 96.6°F | Resp 18 | Ht 71.0 in | Wt 273.6 lb

## 2015-01-23 DIAGNOSIS — K219 Gastro-esophageal reflux disease without esophagitis: Secondary | ICD-10-CM

## 2015-01-23 DIAGNOSIS — Z86010 Personal history of colonic polyps: Secondary | ICD-10-CM

## 2015-01-23 NOTE — Progress Notes (Signed)
Presenting complaint;  Follow-up for GERD. History of colonic polyps.  Subjective:  Patient is 74 year old Caucasian male who presents for yearly visit. He states he is doing well. He states this he's been treated for H. pylori gastritis which was about 3 years ago he has not been able to eat hamburgers. He develops abdominal pain and diarrhea. He states heartburn is well controlled with therapy but he did wake up in the middle of night one week ago with heartburn which he believes was secondary to food that he eats and the night before. He denies dysphagia hoarseness cough or sore throat. He is not having any side effects with PPI. He states he change his eating habits. He eats much less bread than he used to. He denies melena or rectal bleeding. His bowels move daily. He has lost 14 pounds since his last visit of 12/20/2013. He states he has lost more than 50 pounds over the last 3 years. He has chronic low back pain. He still working at least 45 hours a week but he does not do Vertell Limber is physical work.  Current Medications: Outpatient Encounter Prescriptions as of 01/23/2015  Medication Sig  . ALPRAZolam (XANAX) 0.5 MG tablet Take 0.5 mg by mouth at bedtime as needed for sleep.  Marland Kitchen aspirin EC 81 MG tablet Take 81 mg by mouth daily.  . cloNIDine (CATAPRES) 0.1 MG tablet Take 0.1 mg by mouth 2 (two) times daily.   Marland Kitchen DEXILANT 60 MG capsule TAKE ONE CAPSULE BY MOUTH EVERY DAY.  . hydrALAZINE (APRESOLINE) 25 MG tablet Take 25 mg by mouth daily.  . nebivolol (BYSTOLIC) 5 MG tablet Take 5 mg by mouth daily.   Marland Kitchen oxyCODONE-acetaminophen (PERCOCET) 5-325 MG per tablet Take 1 tablet by mouth every 6 (six) hours as needed for severe pain.  . sitaGLIPtan-metformin (JANUMET) 50-1000 MG per tablet Take 1 tablet by mouth 2 (two) times daily with a meal.    . telmisartan (MICARDIS) 80 MG tablet Take 80 mg by mouth daily.   . valsartan (DIOVAN) 320 MG tablet Take 320 mg by mouth daily.    . [DISCONTINUED]  diazepam (VALIUM) 5 MG tablet Take 5 mg by mouth 2 (two) times daily. For anxiety     Objective: Blood pressure 140/92, pulse 78, temperature 96.6 F (35.9 C), temperature source Oral, resp. rate 18, height 5\' 11"  (1.803 m), weight 273 lb 9.6 oz (124.104 kg). Patient is alert and in no acute distress. Conjunctiva is pink. Sclera is nonicteric Oropharyngeal mucosa is normal. No neck masses or thyromegaly noted. Cardiac exam with regular rhythm normal S1 and S2. Grade 2/6 systolic ejection murmur best heard at aortic area. Lungs are clear to auscultation. Abdomen is protuberant but soft and nontender without organomegaly or masses.  No LE edema or clubbing noted.    Assessment:  #1. GERD. Symptoms well controlled with therapy. We'll consider dropping dose next year if he continues to lose weight. #2. History of colonic adenomas. Last colonoscopy was in March 2012. #3. H pylori treated in March 2013. #4. New finding of systolic murmur.   Plan:  Continue anti-reflux measures. Continue Dexilant at 60 mg by mouth every morning. If he continues to lose weight will consider dropping dose next year. Further workup regarding systolic murmur per Dr. Wende Neighbors. Surveillance colonoscopy in March 2017. Office visit in 1 year.

## 2015-01-23 NOTE — Patient Instructions (Addendum)
Notify if Dexilant quits working. Check with Dr. Delphina Cahill if you need further evaluation for heart murmur

## 2015-01-24 DIAGNOSIS — Z6841 Body Mass Index (BMI) 40.0 and over, adult: Secondary | ICD-10-CM | POA: Diagnosis not present

## 2015-01-24 DIAGNOSIS — I10 Essential (primary) hypertension: Secondary | ICD-10-CM | POA: Diagnosis not present

## 2015-01-24 DIAGNOSIS — R011 Cardiac murmur, unspecified: Secondary | ICD-10-CM | POA: Diagnosis not present

## 2015-01-30 ENCOUNTER — Ambulatory Visit (HOSPITAL_COMMUNITY)
Admission: RE | Admit: 2015-01-30 | Discharge: 2015-01-30 | Disposition: A | Discharge status: Home / Self Care | Payer: Medicare Other | Source: Ambulatory Visit | Attending: Pulmonary Disease | Admitting: Pulmonary Disease

## 2015-01-30 DIAGNOSIS — E785 Hyperlipidemia, unspecified: Secondary | ICD-10-CM | POA: Diagnosis not present

## 2015-01-30 DIAGNOSIS — R011 Cardiac murmur, unspecified: Secondary | ICD-10-CM | POA: Insufficient documentation

## 2015-01-30 DIAGNOSIS — E119 Type 2 diabetes mellitus without complications: Secondary | ICD-10-CM | POA: Insufficient documentation

## 2015-01-30 DIAGNOSIS — I10 Essential (primary) hypertension: Secondary | ICD-10-CM | POA: Diagnosis not present

## 2015-01-30 NOTE — Progress Notes (Signed)
*  PRELIMINARY RESULTS* Echocardiogram 2D Echocardiogram has been performed.  Leavy Cella 01/30/2015, 12:35 PM

## 2015-02-12 DIAGNOSIS — E291 Testicular hypofunction: Secondary | ICD-10-CM | POA: Diagnosis not present

## 2015-02-26 DIAGNOSIS — I10 Essential (primary) hypertension: Secondary | ICD-10-CM | POA: Diagnosis not present

## 2015-02-26 DIAGNOSIS — E1165 Type 2 diabetes mellitus with hyperglycemia: Secondary | ICD-10-CM | POA: Diagnosis not present

## 2015-03-02 DIAGNOSIS — I10 Essential (primary) hypertension: Secondary | ICD-10-CM | POA: Diagnosis not present

## 2015-03-02 DIAGNOSIS — E782 Mixed hyperlipidemia: Secondary | ICD-10-CM | POA: Diagnosis not present

## 2015-03-02 DIAGNOSIS — F419 Anxiety disorder, unspecified: Secondary | ICD-10-CM | POA: Diagnosis not present

## 2015-03-02 DIAGNOSIS — E1165 Type 2 diabetes mellitus with hyperglycemia: Secondary | ICD-10-CM | POA: Diagnosis not present

## 2015-03-26 DIAGNOSIS — E291 Testicular hypofunction: Secondary | ICD-10-CM | POA: Diagnosis not present

## 2015-04-03 DIAGNOSIS — M5136 Other intervertebral disc degeneration, lumbar region: Secondary | ICD-10-CM | POA: Diagnosis not present

## 2015-04-03 DIAGNOSIS — M9903 Segmental and somatic dysfunction of lumbar region: Secondary | ICD-10-CM | POA: Diagnosis not present

## 2015-04-03 DIAGNOSIS — M9905 Segmental and somatic dysfunction of pelvic region: Secondary | ICD-10-CM | POA: Diagnosis not present

## 2015-04-03 DIAGNOSIS — M5441 Lumbago with sciatica, right side: Secondary | ICD-10-CM | POA: Diagnosis not present

## 2015-04-16 DIAGNOSIS — E290 Testicular hyperfunction: Secondary | ICD-10-CM | POA: Diagnosis not present

## 2015-04-17 DIAGNOSIS — J069 Acute upper respiratory infection, unspecified: Secondary | ICD-10-CM | POA: Diagnosis not present

## 2015-05-04 DIAGNOSIS — E290 Testicular hyperfunction: Secondary | ICD-10-CM | POA: Diagnosis not present

## 2015-05-18 DIAGNOSIS — E291 Testicular hypofunction: Secondary | ICD-10-CM | POA: Diagnosis not present

## 2015-06-06 DIAGNOSIS — I10 Essential (primary) hypertension: Secondary | ICD-10-CM | POA: Diagnosis not present

## 2015-06-06 DIAGNOSIS — E1165 Type 2 diabetes mellitus with hyperglycemia: Secondary | ICD-10-CM | POA: Diagnosis not present

## 2015-06-06 DIAGNOSIS — E782 Mixed hyperlipidemia: Secondary | ICD-10-CM | POA: Diagnosis not present

## 2015-06-08 DIAGNOSIS — E291 Testicular hypofunction: Secondary | ICD-10-CM | POA: Diagnosis not present

## 2015-07-06 DIAGNOSIS — M9903 Segmental and somatic dysfunction of lumbar region: Secondary | ICD-10-CM | POA: Diagnosis not present

## 2015-07-06 DIAGNOSIS — M9905 Segmental and somatic dysfunction of pelvic region: Secondary | ICD-10-CM | POA: Diagnosis not present

## 2015-07-06 DIAGNOSIS — M5441 Lumbago with sciatica, right side: Secondary | ICD-10-CM | POA: Diagnosis not present

## 2015-07-06 DIAGNOSIS — M5136 Other intervertebral disc degeneration, lumbar region: Secondary | ICD-10-CM | POA: Diagnosis not present

## 2015-07-11 DIAGNOSIS — J019 Acute sinusitis, unspecified: Secondary | ICD-10-CM | POA: Diagnosis not present

## 2015-07-11 DIAGNOSIS — H9209 Otalgia, unspecified ear: Secondary | ICD-10-CM | POA: Diagnosis not present

## 2015-07-18 DIAGNOSIS — M5136 Other intervertebral disc degeneration, lumbar region: Secondary | ICD-10-CM | POA: Diagnosis not present

## 2015-07-18 DIAGNOSIS — M9903 Segmental and somatic dysfunction of lumbar region: Secondary | ICD-10-CM | POA: Diagnosis not present

## 2015-07-18 DIAGNOSIS — M9905 Segmental and somatic dysfunction of pelvic region: Secondary | ICD-10-CM | POA: Diagnosis not present

## 2015-07-18 DIAGNOSIS — M5441 Lumbago with sciatica, right side: Secondary | ICD-10-CM | POA: Diagnosis not present

## 2015-07-19 DIAGNOSIS — E291 Testicular hypofunction: Secondary | ICD-10-CM | POA: Diagnosis not present

## 2015-08-09 DIAGNOSIS — Z23 Encounter for immunization: Secondary | ICD-10-CM | POA: Diagnosis not present

## 2015-08-09 DIAGNOSIS — E291 Testicular hypofunction: Secondary | ICD-10-CM | POA: Diagnosis not present

## 2015-08-28 DIAGNOSIS — M9905 Segmental and somatic dysfunction of pelvic region: Secondary | ICD-10-CM | POA: Diagnosis not present

## 2015-08-28 DIAGNOSIS — M5441 Lumbago with sciatica, right side: Secondary | ICD-10-CM | POA: Diagnosis not present

## 2015-08-28 DIAGNOSIS — M5136 Other intervertebral disc degeneration, lumbar region: Secondary | ICD-10-CM | POA: Diagnosis not present

## 2015-08-28 DIAGNOSIS — M9903 Segmental and somatic dysfunction of lumbar region: Secondary | ICD-10-CM | POA: Diagnosis not present

## 2015-09-25 DIAGNOSIS — E291 Testicular hypofunction: Secondary | ICD-10-CM | POA: Diagnosis not present

## 2015-10-10 ENCOUNTER — Encounter (INDEPENDENT_AMBULATORY_CARE_PROVIDER_SITE_OTHER): Payer: Self-pay | Admitting: *Deleted

## 2015-10-11 DIAGNOSIS — E291 Testicular hypofunction: Secondary | ICD-10-CM | POA: Diagnosis not present

## 2015-10-30 DIAGNOSIS — M9905 Segmental and somatic dysfunction of pelvic region: Secondary | ICD-10-CM | POA: Diagnosis not present

## 2015-10-30 DIAGNOSIS — M5136 Other intervertebral disc degeneration, lumbar region: Secondary | ICD-10-CM | POA: Diagnosis not present

## 2015-10-30 DIAGNOSIS — M9903 Segmental and somatic dysfunction of lumbar region: Secondary | ICD-10-CM | POA: Diagnosis not present

## 2015-10-30 DIAGNOSIS — M5441 Lumbago with sciatica, right side: Secondary | ICD-10-CM | POA: Diagnosis not present

## 2015-11-01 DIAGNOSIS — E291 Testicular hypofunction: Secondary | ICD-10-CM | POA: Diagnosis not present

## 2015-11-14 DIAGNOSIS — K589 Irritable bowel syndrome without diarrhea: Secondary | ICD-10-CM | POA: Diagnosis not present

## 2015-11-15 DIAGNOSIS — E782 Mixed hyperlipidemia: Secondary | ICD-10-CM | POA: Diagnosis not present

## 2015-11-15 DIAGNOSIS — E291 Testicular hypofunction: Secondary | ICD-10-CM | POA: Diagnosis not present

## 2015-11-15 DIAGNOSIS — K219 Gastro-esophageal reflux disease without esophagitis: Secondary | ICD-10-CM | POA: Diagnosis not present

## 2015-11-15 DIAGNOSIS — E1165 Type 2 diabetes mellitus with hyperglycemia: Secondary | ICD-10-CM | POA: Diagnosis not present

## 2015-11-20 DIAGNOSIS — E782 Mixed hyperlipidemia: Secondary | ICD-10-CM | POA: Diagnosis not present

## 2015-11-20 DIAGNOSIS — E291 Testicular hypofunction: Secondary | ICD-10-CM | POA: Diagnosis not present

## 2015-11-20 DIAGNOSIS — I10 Essential (primary) hypertension: Secondary | ICD-10-CM | POA: Diagnosis not present

## 2015-11-20 DIAGNOSIS — K589 Irritable bowel syndrome without diarrhea: Secondary | ICD-10-CM | POA: Diagnosis not present

## 2015-11-20 DIAGNOSIS — M5432 Sciatica, left side: Secondary | ICD-10-CM | POA: Diagnosis not present

## 2015-11-20 DIAGNOSIS — E119 Type 2 diabetes mellitus without complications: Secondary | ICD-10-CM | POA: Diagnosis not present

## 2015-11-20 DIAGNOSIS — F411 Generalized anxiety disorder: Secondary | ICD-10-CM | POA: Diagnosis not present

## 2015-11-22 DIAGNOSIS — E291 Testicular hypofunction: Secondary | ICD-10-CM | POA: Diagnosis not present

## 2015-11-27 DIAGNOSIS — L0291 Cutaneous abscess, unspecified: Secondary | ICD-10-CM | POA: Diagnosis not present

## 2015-12-13 DIAGNOSIS — E291 Testicular hypofunction: Secondary | ICD-10-CM | POA: Diagnosis not present

## 2015-12-20 DIAGNOSIS — M5441 Lumbago with sciatica, right side: Secondary | ICD-10-CM | POA: Diagnosis not present

## 2015-12-20 DIAGNOSIS — M9905 Segmental and somatic dysfunction of pelvic region: Secondary | ICD-10-CM | POA: Diagnosis not present

## 2015-12-20 DIAGNOSIS — M9903 Segmental and somatic dysfunction of lumbar region: Secondary | ICD-10-CM | POA: Diagnosis not present

## 2015-12-20 DIAGNOSIS — M9902 Segmental and somatic dysfunction of thoracic region: Secondary | ICD-10-CM | POA: Diagnosis not present

## 2015-12-24 DIAGNOSIS — M9903 Segmental and somatic dysfunction of lumbar region: Secondary | ICD-10-CM | POA: Diagnosis not present

## 2015-12-24 DIAGNOSIS — M9902 Segmental and somatic dysfunction of thoracic region: Secondary | ICD-10-CM | POA: Diagnosis not present

## 2015-12-24 DIAGNOSIS — M9905 Segmental and somatic dysfunction of pelvic region: Secondary | ICD-10-CM | POA: Diagnosis not present

## 2015-12-24 DIAGNOSIS — M5441 Lumbago with sciatica, right side: Secondary | ICD-10-CM | POA: Diagnosis not present

## 2015-12-26 DIAGNOSIS — M9905 Segmental and somatic dysfunction of pelvic region: Secondary | ICD-10-CM | POA: Diagnosis not present

## 2015-12-26 DIAGNOSIS — M9902 Segmental and somatic dysfunction of thoracic region: Secondary | ICD-10-CM | POA: Diagnosis not present

## 2015-12-26 DIAGNOSIS — M5441 Lumbago with sciatica, right side: Secondary | ICD-10-CM | POA: Diagnosis not present

## 2015-12-26 DIAGNOSIS — M9903 Segmental and somatic dysfunction of lumbar region: Secondary | ICD-10-CM | POA: Diagnosis not present

## 2015-12-28 DIAGNOSIS — M9903 Segmental and somatic dysfunction of lumbar region: Secondary | ICD-10-CM | POA: Diagnosis not present

## 2015-12-28 DIAGNOSIS — M9902 Segmental and somatic dysfunction of thoracic region: Secondary | ICD-10-CM | POA: Diagnosis not present

## 2015-12-28 DIAGNOSIS — M5441 Lumbago with sciatica, right side: Secondary | ICD-10-CM | POA: Diagnosis not present

## 2015-12-28 DIAGNOSIS — M9905 Segmental and somatic dysfunction of pelvic region: Secondary | ICD-10-CM | POA: Diagnosis not present

## 2016-01-02 DIAGNOSIS — E291 Testicular hypofunction: Secondary | ICD-10-CM | POA: Diagnosis not present

## 2016-01-07 DIAGNOSIS — M9905 Segmental and somatic dysfunction of pelvic region: Secondary | ICD-10-CM | POA: Diagnosis not present

## 2016-01-07 DIAGNOSIS — M9903 Segmental and somatic dysfunction of lumbar region: Secondary | ICD-10-CM | POA: Diagnosis not present

## 2016-01-07 DIAGNOSIS — M5441 Lumbago with sciatica, right side: Secondary | ICD-10-CM | POA: Diagnosis not present

## 2016-01-07 DIAGNOSIS — M9902 Segmental and somatic dysfunction of thoracic region: Secondary | ICD-10-CM | POA: Diagnosis not present

## 2016-01-09 DIAGNOSIS — M9905 Segmental and somatic dysfunction of pelvic region: Secondary | ICD-10-CM | POA: Diagnosis not present

## 2016-01-09 DIAGNOSIS — M5441 Lumbago with sciatica, right side: Secondary | ICD-10-CM | POA: Diagnosis not present

## 2016-01-09 DIAGNOSIS — M9902 Segmental and somatic dysfunction of thoracic region: Secondary | ICD-10-CM | POA: Diagnosis not present

## 2016-01-09 DIAGNOSIS — M9903 Segmental and somatic dysfunction of lumbar region: Secondary | ICD-10-CM | POA: Diagnosis not present

## 2016-01-21 DIAGNOSIS — M9903 Segmental and somatic dysfunction of lumbar region: Secondary | ICD-10-CM | POA: Diagnosis not present

## 2016-01-21 DIAGNOSIS — M5441 Lumbago with sciatica, right side: Secondary | ICD-10-CM | POA: Diagnosis not present

## 2016-01-21 DIAGNOSIS — M9905 Segmental and somatic dysfunction of pelvic region: Secondary | ICD-10-CM | POA: Diagnosis not present

## 2016-01-21 DIAGNOSIS — M9902 Segmental and somatic dysfunction of thoracic region: Secondary | ICD-10-CM | POA: Diagnosis not present

## 2016-01-23 DIAGNOSIS — E291 Testicular hypofunction: Secondary | ICD-10-CM | POA: Diagnosis not present

## 2016-01-25 DIAGNOSIS — M5441 Lumbago with sciatica, right side: Secondary | ICD-10-CM | POA: Diagnosis not present

## 2016-01-25 DIAGNOSIS — M9905 Segmental and somatic dysfunction of pelvic region: Secondary | ICD-10-CM | POA: Diagnosis not present

## 2016-01-25 DIAGNOSIS — M9903 Segmental and somatic dysfunction of lumbar region: Secondary | ICD-10-CM | POA: Diagnosis not present

## 2016-01-25 DIAGNOSIS — M9902 Segmental and somatic dysfunction of thoracic region: Secondary | ICD-10-CM | POA: Diagnosis not present

## 2016-01-28 ENCOUNTER — Ambulatory Visit (INDEPENDENT_AMBULATORY_CARE_PROVIDER_SITE_OTHER): Payer: Medicare Other | Admitting: Internal Medicine

## 2016-02-07 DIAGNOSIS — Z125 Encounter for screening for malignant neoplasm of prostate: Secondary | ICD-10-CM | POA: Diagnosis not present

## 2016-02-07 DIAGNOSIS — E291 Testicular hypofunction: Secondary | ICD-10-CM | POA: Diagnosis not present

## 2016-02-07 DIAGNOSIS — E1165 Type 2 diabetes mellitus with hyperglycemia: Secondary | ICD-10-CM | POA: Diagnosis not present

## 2016-02-07 DIAGNOSIS — E782 Mixed hyperlipidemia: Secondary | ICD-10-CM | POA: Diagnosis not present

## 2016-02-08 DIAGNOSIS — M5441 Lumbago with sciatica, right side: Secondary | ICD-10-CM | POA: Diagnosis not present

## 2016-02-08 DIAGNOSIS — M9905 Segmental and somatic dysfunction of pelvic region: Secondary | ICD-10-CM | POA: Diagnosis not present

## 2016-02-08 DIAGNOSIS — M9903 Segmental and somatic dysfunction of lumbar region: Secondary | ICD-10-CM | POA: Diagnosis not present

## 2016-02-08 DIAGNOSIS — M9902 Segmental and somatic dysfunction of thoracic region: Secondary | ICD-10-CM | POA: Diagnosis not present

## 2016-02-27 DIAGNOSIS — E291 Testicular hypofunction: Secondary | ICD-10-CM | POA: Diagnosis not present

## 2016-02-29 DIAGNOSIS — M9903 Segmental and somatic dysfunction of lumbar region: Secondary | ICD-10-CM | POA: Diagnosis not present

## 2016-02-29 DIAGNOSIS — M9905 Segmental and somatic dysfunction of pelvic region: Secondary | ICD-10-CM | POA: Diagnosis not present

## 2016-02-29 DIAGNOSIS — M5441 Lumbago with sciatica, right side: Secondary | ICD-10-CM | POA: Diagnosis not present

## 2016-02-29 DIAGNOSIS — M9902 Segmental and somatic dysfunction of thoracic region: Secondary | ICD-10-CM | POA: Diagnosis not present

## 2016-03-10 DIAGNOSIS — M9905 Segmental and somatic dysfunction of pelvic region: Secondary | ICD-10-CM | POA: Diagnosis not present

## 2016-03-10 DIAGNOSIS — M9902 Segmental and somatic dysfunction of thoracic region: Secondary | ICD-10-CM | POA: Diagnosis not present

## 2016-03-10 DIAGNOSIS — M9903 Segmental and somatic dysfunction of lumbar region: Secondary | ICD-10-CM | POA: Diagnosis not present

## 2016-03-10 DIAGNOSIS — M5441 Lumbago with sciatica, right side: Secondary | ICD-10-CM | POA: Diagnosis not present

## 2016-03-12 ENCOUNTER — Encounter (INDEPENDENT_AMBULATORY_CARE_PROVIDER_SITE_OTHER): Payer: Self-pay | Admitting: *Deleted

## 2016-03-13 DIAGNOSIS — K589 Irritable bowel syndrome without diarrhea: Secondary | ICD-10-CM | POA: Diagnosis not present

## 2016-03-13 DIAGNOSIS — E782 Mixed hyperlipidemia: Secondary | ICD-10-CM | POA: Diagnosis not present

## 2016-03-13 DIAGNOSIS — M9905 Segmental and somatic dysfunction of pelvic region: Secondary | ICD-10-CM | POA: Diagnosis not present

## 2016-03-13 DIAGNOSIS — M5441 Lumbago with sciatica, right side: Secondary | ICD-10-CM | POA: Diagnosis not present

## 2016-03-13 DIAGNOSIS — M9903 Segmental and somatic dysfunction of lumbar region: Secondary | ICD-10-CM | POA: Diagnosis not present

## 2016-03-13 DIAGNOSIS — E291 Testicular hypofunction: Secondary | ICD-10-CM | POA: Diagnosis not present

## 2016-03-13 DIAGNOSIS — M9902 Segmental and somatic dysfunction of thoracic region: Secondary | ICD-10-CM | POA: Diagnosis not present

## 2016-03-13 DIAGNOSIS — E1165 Type 2 diabetes mellitus with hyperglycemia: Secondary | ICD-10-CM | POA: Diagnosis not present

## 2016-03-20 DIAGNOSIS — E291 Testicular hypofunction: Secondary | ICD-10-CM | POA: Diagnosis not present

## 2016-03-24 DIAGNOSIS — J00 Acute nasopharyngitis [common cold]: Secondary | ICD-10-CM | POA: Diagnosis not present

## 2016-03-24 DIAGNOSIS — S0500XA Injury of conjunctiva and corneal abrasion without foreign body, unspecified eye, initial encounter: Secondary | ICD-10-CM | POA: Diagnosis not present

## 2016-03-24 DIAGNOSIS — M9902 Segmental and somatic dysfunction of thoracic region: Secondary | ICD-10-CM | POA: Diagnosis not present

## 2016-03-24 DIAGNOSIS — M5441 Lumbago with sciatica, right side: Secondary | ICD-10-CM | POA: Diagnosis not present

## 2016-03-24 DIAGNOSIS — M9903 Segmental and somatic dysfunction of lumbar region: Secondary | ICD-10-CM | POA: Diagnosis not present

## 2016-03-24 DIAGNOSIS — M9905 Segmental and somatic dysfunction of pelvic region: Secondary | ICD-10-CM | POA: Diagnosis not present

## 2016-04-03 DIAGNOSIS — M9905 Segmental and somatic dysfunction of pelvic region: Secondary | ICD-10-CM | POA: Diagnosis not present

## 2016-04-03 DIAGNOSIS — M5441 Lumbago with sciatica, right side: Secondary | ICD-10-CM | POA: Diagnosis not present

## 2016-04-03 DIAGNOSIS — M9902 Segmental and somatic dysfunction of thoracic region: Secondary | ICD-10-CM | POA: Diagnosis not present

## 2016-04-03 DIAGNOSIS — M9903 Segmental and somatic dysfunction of lumbar region: Secondary | ICD-10-CM | POA: Diagnosis not present

## 2016-04-04 DIAGNOSIS — M9905 Segmental and somatic dysfunction of pelvic region: Secondary | ICD-10-CM | POA: Diagnosis not present

## 2016-04-04 DIAGNOSIS — M5441 Lumbago with sciatica, right side: Secondary | ICD-10-CM | POA: Diagnosis not present

## 2016-04-04 DIAGNOSIS — M9903 Segmental and somatic dysfunction of lumbar region: Secondary | ICD-10-CM | POA: Diagnosis not present

## 2016-04-04 DIAGNOSIS — M9902 Segmental and somatic dysfunction of thoracic region: Secondary | ICD-10-CM | POA: Diagnosis not present

## 2016-04-09 DIAGNOSIS — H25013 Cortical age-related cataract, bilateral: Secondary | ICD-10-CM | POA: Diagnosis not present

## 2016-04-09 DIAGNOSIS — T1501XA Foreign body in cornea, right eye, initial encounter: Secondary | ICD-10-CM | POA: Diagnosis not present

## 2016-04-09 DIAGNOSIS — H01003 Unspecified blepharitis right eye, unspecified eyelid: Secondary | ICD-10-CM | POA: Diagnosis not present

## 2016-04-09 DIAGNOSIS — T1502XA Foreign body in cornea, left eye, initial encounter: Secondary | ICD-10-CM | POA: Diagnosis not present

## 2016-04-09 DIAGNOSIS — H2513 Age-related nuclear cataract, bilateral: Secondary | ICD-10-CM | POA: Diagnosis not present

## 2016-04-21 DIAGNOSIS — M9905 Segmental and somatic dysfunction of pelvic region: Secondary | ICD-10-CM | POA: Diagnosis not present

## 2016-04-21 DIAGNOSIS — M9902 Segmental and somatic dysfunction of thoracic region: Secondary | ICD-10-CM | POA: Diagnosis not present

## 2016-04-21 DIAGNOSIS — M9903 Segmental and somatic dysfunction of lumbar region: Secondary | ICD-10-CM | POA: Diagnosis not present

## 2016-04-21 DIAGNOSIS — M5441 Lumbago with sciatica, right side: Secondary | ICD-10-CM | POA: Diagnosis not present

## 2016-05-23 DIAGNOSIS — E291 Testicular hypofunction: Secondary | ICD-10-CM | POA: Diagnosis not present

## 2016-06-02 DIAGNOSIS — M9905 Segmental and somatic dysfunction of pelvic region: Secondary | ICD-10-CM | POA: Diagnosis not present

## 2016-06-02 DIAGNOSIS — M5441 Lumbago with sciatica, right side: Secondary | ICD-10-CM | POA: Diagnosis not present

## 2016-06-02 DIAGNOSIS — M9903 Segmental and somatic dysfunction of lumbar region: Secondary | ICD-10-CM | POA: Diagnosis not present

## 2016-06-02 DIAGNOSIS — M9902 Segmental and somatic dysfunction of thoracic region: Secondary | ICD-10-CM | POA: Diagnosis not present

## 2016-06-17 DIAGNOSIS — E291 Testicular hypofunction: Secondary | ICD-10-CM | POA: Diagnosis not present

## 2016-06-27 DIAGNOSIS — E291 Testicular hypofunction: Secondary | ICD-10-CM | POA: Diagnosis not present

## 2016-06-30 DIAGNOSIS — M9905 Segmental and somatic dysfunction of pelvic region: Secondary | ICD-10-CM | POA: Diagnosis not present

## 2016-06-30 DIAGNOSIS — M546 Pain in thoracic spine: Secondary | ICD-10-CM | POA: Diagnosis not present

## 2016-06-30 DIAGNOSIS — M9903 Segmental and somatic dysfunction of lumbar region: Secondary | ICD-10-CM | POA: Diagnosis not present

## 2016-06-30 DIAGNOSIS — M5441 Lumbago with sciatica, right side: Secondary | ICD-10-CM | POA: Diagnosis not present

## 2016-06-30 DIAGNOSIS — M9902 Segmental and somatic dysfunction of thoracic region: Secondary | ICD-10-CM | POA: Diagnosis not present

## 2016-07-04 DIAGNOSIS — H34211 Partial retinal artery occlusion, right eye: Secondary | ICD-10-CM | POA: Diagnosis not present

## 2016-07-04 DIAGNOSIS — H35033 Hypertensive retinopathy, bilateral: Secondary | ICD-10-CM | POA: Diagnosis not present

## 2016-07-04 DIAGNOSIS — E119 Type 2 diabetes mellitus without complications: Secondary | ICD-10-CM | POA: Diagnosis not present

## 2016-07-04 DIAGNOSIS — H2513 Age-related nuclear cataract, bilateral: Secondary | ICD-10-CM | POA: Diagnosis not present

## 2016-07-04 DIAGNOSIS — H25013 Cortical age-related cataract, bilateral: Secondary | ICD-10-CM | POA: Diagnosis not present

## 2016-07-07 DIAGNOSIS — M9903 Segmental and somatic dysfunction of lumbar region: Secondary | ICD-10-CM | POA: Diagnosis not present

## 2016-07-07 DIAGNOSIS — M5441 Lumbago with sciatica, right side: Secondary | ICD-10-CM | POA: Diagnosis not present

## 2016-07-07 DIAGNOSIS — M546 Pain in thoracic spine: Secondary | ICD-10-CM | POA: Diagnosis not present

## 2016-07-07 DIAGNOSIS — M9905 Segmental and somatic dysfunction of pelvic region: Secondary | ICD-10-CM | POA: Diagnosis not present

## 2016-07-07 DIAGNOSIS — M9902 Segmental and somatic dysfunction of thoracic region: Secondary | ICD-10-CM | POA: Diagnosis not present

## 2016-07-14 DIAGNOSIS — M9905 Segmental and somatic dysfunction of pelvic region: Secondary | ICD-10-CM | POA: Diagnosis not present

## 2016-07-14 DIAGNOSIS — M546 Pain in thoracic spine: Secondary | ICD-10-CM | POA: Diagnosis not present

## 2016-07-14 DIAGNOSIS — M9903 Segmental and somatic dysfunction of lumbar region: Secondary | ICD-10-CM | POA: Diagnosis not present

## 2016-07-14 DIAGNOSIS — M5441 Lumbago with sciatica, right side: Secondary | ICD-10-CM | POA: Diagnosis not present

## 2016-07-14 DIAGNOSIS — M9902 Segmental and somatic dysfunction of thoracic region: Secondary | ICD-10-CM | POA: Diagnosis not present

## 2016-07-17 DIAGNOSIS — E291 Testicular hypofunction: Secondary | ICD-10-CM | POA: Diagnosis not present

## 2016-07-21 DIAGNOSIS — E1165 Type 2 diabetes mellitus with hyperglycemia: Secondary | ICD-10-CM | POA: Diagnosis not present

## 2016-07-21 DIAGNOSIS — E291 Testicular hypofunction: Secondary | ICD-10-CM | POA: Diagnosis not present

## 2016-07-23 DIAGNOSIS — E1165 Type 2 diabetes mellitus with hyperglycemia: Secondary | ICD-10-CM | POA: Diagnosis not present

## 2016-07-23 DIAGNOSIS — E782 Mixed hyperlipidemia: Secondary | ICD-10-CM | POA: Diagnosis not present

## 2016-07-23 DIAGNOSIS — I10 Essential (primary) hypertension: Secondary | ICD-10-CM | POA: Diagnosis not present

## 2016-07-23 DIAGNOSIS — K219 Gastro-esophageal reflux disease without esophagitis: Secondary | ICD-10-CM | POA: Diagnosis not present

## 2016-07-23 DIAGNOSIS — F411 Generalized anxiety disorder: Secondary | ICD-10-CM | POA: Diagnosis not present

## 2016-07-31 DIAGNOSIS — H11002 Unspecified pterygium of left eye: Secondary | ICD-10-CM | POA: Diagnosis not present

## 2016-08-22 DIAGNOSIS — E291 Testicular hypofunction: Secondary | ICD-10-CM | POA: Diagnosis not present

## 2016-09-08 DIAGNOSIS — M9905 Segmental and somatic dysfunction of pelvic region: Secondary | ICD-10-CM | POA: Diagnosis not present

## 2016-09-08 DIAGNOSIS — M5441 Lumbago with sciatica, right side: Secondary | ICD-10-CM | POA: Diagnosis not present

## 2016-09-08 DIAGNOSIS — M546 Pain in thoracic spine: Secondary | ICD-10-CM | POA: Diagnosis not present

## 2016-09-08 DIAGNOSIS — M9902 Segmental and somatic dysfunction of thoracic region: Secondary | ICD-10-CM | POA: Diagnosis not present

## 2016-09-08 DIAGNOSIS — M9903 Segmental and somatic dysfunction of lumbar region: Secondary | ICD-10-CM | POA: Diagnosis not present

## 2016-09-10 DIAGNOSIS — M9902 Segmental and somatic dysfunction of thoracic region: Secondary | ICD-10-CM | POA: Diagnosis not present

## 2016-09-10 DIAGNOSIS — M9903 Segmental and somatic dysfunction of lumbar region: Secondary | ICD-10-CM | POA: Diagnosis not present

## 2016-09-10 DIAGNOSIS — M5441 Lumbago with sciatica, right side: Secondary | ICD-10-CM | POA: Diagnosis not present

## 2016-09-10 DIAGNOSIS — M9905 Segmental and somatic dysfunction of pelvic region: Secondary | ICD-10-CM | POA: Diagnosis not present

## 2016-09-10 DIAGNOSIS — M546 Pain in thoracic spine: Secondary | ICD-10-CM | POA: Diagnosis not present

## 2016-09-12 DIAGNOSIS — E291 Testicular hypofunction: Secondary | ICD-10-CM | POA: Diagnosis not present

## 2016-10-06 DIAGNOSIS — M79669 Pain in unspecified lower leg: Secondary | ICD-10-CM | POA: Diagnosis not present

## 2016-10-06 DIAGNOSIS — F5101 Primary insomnia: Secondary | ICD-10-CM | POA: Diagnosis not present

## 2016-10-06 DIAGNOSIS — L918 Other hypertrophic disorders of the skin: Secondary | ICD-10-CM | POA: Diagnosis not present

## 2016-10-06 DIAGNOSIS — I10 Essential (primary) hypertension: Secondary | ICD-10-CM | POA: Diagnosis not present

## 2016-10-20 DIAGNOSIS — M546 Pain in thoracic spine: Secondary | ICD-10-CM | POA: Diagnosis not present

## 2016-10-20 DIAGNOSIS — M9902 Segmental and somatic dysfunction of thoracic region: Secondary | ICD-10-CM | POA: Diagnosis not present

## 2016-10-20 DIAGNOSIS — M9905 Segmental and somatic dysfunction of pelvic region: Secondary | ICD-10-CM | POA: Diagnosis not present

## 2016-10-20 DIAGNOSIS — M9903 Segmental and somatic dysfunction of lumbar region: Secondary | ICD-10-CM | POA: Diagnosis not present

## 2016-10-20 DIAGNOSIS — M5441 Lumbago with sciatica, right side: Secondary | ICD-10-CM | POA: Diagnosis not present

## 2016-10-21 DIAGNOSIS — M9903 Segmental and somatic dysfunction of lumbar region: Secondary | ICD-10-CM | POA: Diagnosis not present

## 2016-10-21 DIAGNOSIS — M5441 Lumbago with sciatica, right side: Secondary | ICD-10-CM | POA: Diagnosis not present

## 2016-10-21 DIAGNOSIS — M546 Pain in thoracic spine: Secondary | ICD-10-CM | POA: Diagnosis not present

## 2016-10-21 DIAGNOSIS — M9905 Segmental and somatic dysfunction of pelvic region: Secondary | ICD-10-CM | POA: Diagnosis not present

## 2016-10-21 DIAGNOSIS — M9902 Segmental and somatic dysfunction of thoracic region: Secondary | ICD-10-CM | POA: Diagnosis not present

## 2016-10-22 DIAGNOSIS — M546 Pain in thoracic spine: Secondary | ICD-10-CM | POA: Diagnosis not present

## 2016-10-22 DIAGNOSIS — M9905 Segmental and somatic dysfunction of pelvic region: Secondary | ICD-10-CM | POA: Diagnosis not present

## 2016-10-22 DIAGNOSIS — M9902 Segmental and somatic dysfunction of thoracic region: Secondary | ICD-10-CM | POA: Diagnosis not present

## 2016-10-22 DIAGNOSIS — M5441 Lumbago with sciatica, right side: Secondary | ICD-10-CM | POA: Diagnosis not present

## 2016-10-22 DIAGNOSIS — M9903 Segmental and somatic dysfunction of lumbar region: Secondary | ICD-10-CM | POA: Diagnosis not present

## 2016-10-29 DIAGNOSIS — E119 Type 2 diabetes mellitus without complications: Secondary | ICD-10-CM | POA: Diagnosis not present

## 2016-10-29 DIAGNOSIS — I10 Essential (primary) hypertension: Secondary | ICD-10-CM | POA: Diagnosis not present

## 2016-10-29 DIAGNOSIS — E291 Testicular hypofunction: Secondary | ICD-10-CM | POA: Diagnosis not present

## 2016-10-29 DIAGNOSIS — E1165 Type 2 diabetes mellitus with hyperglycemia: Secondary | ICD-10-CM | POA: Diagnosis not present

## 2016-10-29 DIAGNOSIS — R7301 Impaired fasting glucose: Secondary | ICD-10-CM | POA: Diagnosis not present

## 2016-10-29 DIAGNOSIS — E782 Mixed hyperlipidemia: Secondary | ICD-10-CM | POA: Diagnosis not present

## 2016-10-29 DIAGNOSIS — E039 Hypothyroidism, unspecified: Secondary | ICD-10-CM | POA: Diagnosis not present

## 2016-10-29 DIAGNOSIS — E785 Hyperlipidemia, unspecified: Secondary | ICD-10-CM | POA: Diagnosis not present

## 2016-10-29 DIAGNOSIS — N529 Male erectile dysfunction, unspecified: Secondary | ICD-10-CM | POA: Diagnosis not present

## 2016-11-05 DIAGNOSIS — M9905 Segmental and somatic dysfunction of pelvic region: Secondary | ICD-10-CM | POA: Diagnosis not present

## 2016-11-05 DIAGNOSIS — M9902 Segmental and somatic dysfunction of thoracic region: Secondary | ICD-10-CM | POA: Diagnosis not present

## 2016-11-05 DIAGNOSIS — M546 Pain in thoracic spine: Secondary | ICD-10-CM | POA: Diagnosis not present

## 2016-11-05 DIAGNOSIS — E782 Mixed hyperlipidemia: Secondary | ICD-10-CM | POA: Diagnosis not present

## 2016-11-05 DIAGNOSIS — K219 Gastro-esophageal reflux disease without esophagitis: Secondary | ICD-10-CM | POA: Diagnosis not present

## 2016-11-05 DIAGNOSIS — I10 Essential (primary) hypertension: Secondary | ICD-10-CM | POA: Diagnosis not present

## 2016-11-05 DIAGNOSIS — M5441 Lumbago with sciatica, right side: Secondary | ICD-10-CM | POA: Diagnosis not present

## 2016-11-05 DIAGNOSIS — F411 Generalized anxiety disorder: Secondary | ICD-10-CM | POA: Diagnosis not present

## 2016-11-05 DIAGNOSIS — E1165 Type 2 diabetes mellitus with hyperglycemia: Secondary | ICD-10-CM | POA: Diagnosis not present

## 2016-11-05 DIAGNOSIS — M9903 Segmental and somatic dysfunction of lumbar region: Secondary | ICD-10-CM | POA: Diagnosis not present

## 2016-11-28 DIAGNOSIS — M9902 Segmental and somatic dysfunction of thoracic region: Secondary | ICD-10-CM | POA: Diagnosis not present

## 2016-11-28 DIAGNOSIS — M9905 Segmental and somatic dysfunction of pelvic region: Secondary | ICD-10-CM | POA: Diagnosis not present

## 2016-11-28 DIAGNOSIS — M546 Pain in thoracic spine: Secondary | ICD-10-CM | POA: Diagnosis not present

## 2016-11-28 DIAGNOSIS — M9903 Segmental and somatic dysfunction of lumbar region: Secondary | ICD-10-CM | POA: Diagnosis not present

## 2016-11-28 DIAGNOSIS — M5441 Lumbago with sciatica, right side: Secondary | ICD-10-CM | POA: Diagnosis not present

## 2016-12-09 DIAGNOSIS — M546 Pain in thoracic spine: Secondary | ICD-10-CM | POA: Diagnosis not present

## 2016-12-09 DIAGNOSIS — M5441 Lumbago with sciatica, right side: Secondary | ICD-10-CM | POA: Diagnosis not present

## 2016-12-09 DIAGNOSIS — M9905 Segmental and somatic dysfunction of pelvic region: Secondary | ICD-10-CM | POA: Diagnosis not present

## 2016-12-09 DIAGNOSIS — M9903 Segmental and somatic dysfunction of lumbar region: Secondary | ICD-10-CM | POA: Diagnosis not present

## 2016-12-09 DIAGNOSIS — M9902 Segmental and somatic dysfunction of thoracic region: Secondary | ICD-10-CM | POA: Diagnosis not present

## 2016-12-10 DIAGNOSIS — E291 Testicular hypofunction: Secondary | ICD-10-CM | POA: Diagnosis not present

## 2016-12-29 DIAGNOSIS — M546 Pain in thoracic spine: Secondary | ICD-10-CM | POA: Diagnosis not present

## 2016-12-29 DIAGNOSIS — M9902 Segmental and somatic dysfunction of thoracic region: Secondary | ICD-10-CM | POA: Diagnosis not present

## 2016-12-29 DIAGNOSIS — M9905 Segmental and somatic dysfunction of pelvic region: Secondary | ICD-10-CM | POA: Diagnosis not present

## 2016-12-29 DIAGNOSIS — M9903 Segmental and somatic dysfunction of lumbar region: Secondary | ICD-10-CM | POA: Diagnosis not present

## 2016-12-29 DIAGNOSIS — M5441 Lumbago with sciatica, right side: Secondary | ICD-10-CM | POA: Diagnosis not present

## 2016-12-31 DIAGNOSIS — M9902 Segmental and somatic dysfunction of thoracic region: Secondary | ICD-10-CM | POA: Diagnosis not present

## 2016-12-31 DIAGNOSIS — E291 Testicular hypofunction: Secondary | ICD-10-CM | POA: Diagnosis not present

## 2016-12-31 DIAGNOSIS — M5441 Lumbago with sciatica, right side: Secondary | ICD-10-CM | POA: Diagnosis not present

## 2016-12-31 DIAGNOSIS — M9903 Segmental and somatic dysfunction of lumbar region: Secondary | ICD-10-CM | POA: Diagnosis not present

## 2016-12-31 DIAGNOSIS — M9905 Segmental and somatic dysfunction of pelvic region: Secondary | ICD-10-CM | POA: Diagnosis not present

## 2016-12-31 DIAGNOSIS — M546 Pain in thoracic spine: Secondary | ICD-10-CM | POA: Diagnosis not present

## 2017-01-07 DIAGNOSIS — M9905 Segmental and somatic dysfunction of pelvic region: Secondary | ICD-10-CM | POA: Diagnosis not present

## 2017-01-07 DIAGNOSIS — M9903 Segmental and somatic dysfunction of lumbar region: Secondary | ICD-10-CM | POA: Diagnosis not present

## 2017-01-07 DIAGNOSIS — M5441 Lumbago with sciatica, right side: Secondary | ICD-10-CM | POA: Diagnosis not present

## 2017-01-07 DIAGNOSIS — M9902 Segmental and somatic dysfunction of thoracic region: Secondary | ICD-10-CM | POA: Diagnosis not present

## 2017-01-07 DIAGNOSIS — M546 Pain in thoracic spine: Secondary | ICD-10-CM | POA: Diagnosis not present

## 2017-01-21 DIAGNOSIS — K219 Gastro-esophageal reflux disease without esophagitis: Secondary | ICD-10-CM | POA: Diagnosis not present

## 2017-01-21 DIAGNOSIS — E6609 Other obesity due to excess calories: Secondary | ICD-10-CM | POA: Diagnosis not present

## 2017-01-21 DIAGNOSIS — E291 Testicular hypofunction: Secondary | ICD-10-CM | POA: Diagnosis not present

## 2017-01-21 DIAGNOSIS — R011 Cardiac murmur, unspecified: Secondary | ICD-10-CM | POA: Diagnosis not present

## 2017-01-21 DIAGNOSIS — E782 Mixed hyperlipidemia: Secondary | ICD-10-CM | POA: Diagnosis not present

## 2017-01-21 DIAGNOSIS — E1165 Type 2 diabetes mellitus with hyperglycemia: Secondary | ICD-10-CM | POA: Diagnosis not present

## 2017-01-21 DIAGNOSIS — K589 Irritable bowel syndrome without diarrhea: Secondary | ICD-10-CM | POA: Diagnosis not present

## 2017-01-21 DIAGNOSIS — F411 Generalized anxiety disorder: Secondary | ICD-10-CM | POA: Diagnosis not present

## 2017-01-21 DIAGNOSIS — F5101 Primary insomnia: Secondary | ICD-10-CM | POA: Diagnosis not present

## 2017-01-21 DIAGNOSIS — M5432 Sciatica, left side: Secondary | ICD-10-CM | POA: Diagnosis not present

## 2017-01-21 DIAGNOSIS — I10 Essential (primary) hypertension: Secondary | ICD-10-CM | POA: Diagnosis not present

## 2017-01-23 DIAGNOSIS — E1165 Type 2 diabetes mellitus with hyperglycemia: Secondary | ICD-10-CM | POA: Diagnosis not present

## 2017-01-23 DIAGNOSIS — E299 Testicular dysfunction, unspecified: Secondary | ICD-10-CM | POA: Diagnosis not present

## 2017-01-23 DIAGNOSIS — E782 Mixed hyperlipidemia: Secondary | ICD-10-CM | POA: Diagnosis not present

## 2017-01-28 DIAGNOSIS — Z6841 Body Mass Index (BMI) 40.0 and over, adult: Secondary | ICD-10-CM | POA: Diagnosis not present

## 2017-01-28 DIAGNOSIS — E1165 Type 2 diabetes mellitus with hyperglycemia: Secondary | ICD-10-CM | POA: Diagnosis not present

## 2017-01-28 DIAGNOSIS — I10 Essential (primary) hypertension: Secondary | ICD-10-CM | POA: Diagnosis not present

## 2017-01-28 DIAGNOSIS — K219 Gastro-esophageal reflux disease without esophagitis: Secondary | ICD-10-CM | POA: Diagnosis not present

## 2017-01-28 DIAGNOSIS — E782 Mixed hyperlipidemia: Secondary | ICD-10-CM | POA: Diagnosis not present

## 2017-01-28 DIAGNOSIS — E291 Testicular hypofunction: Secondary | ICD-10-CM | POA: Diagnosis not present

## 2017-01-28 DIAGNOSIS — F411 Generalized anxiety disorder: Secondary | ICD-10-CM | POA: Diagnosis not present

## 2017-02-11 ENCOUNTER — Ambulatory Visit (HOSPITAL_COMMUNITY)
Admission: RE | Admit: 2017-02-11 | Discharge: 2017-02-11 | Disposition: A | Discharge status: Home / Self Care | Payer: Medicare Other | Source: Ambulatory Visit | Attending: Internal Medicine | Admitting: Internal Medicine

## 2017-02-11 ENCOUNTER — Other Ambulatory Visit (HOSPITAL_COMMUNITY): Payer: Self-pay | Admitting: Internal Medicine

## 2017-02-11 DIAGNOSIS — M79604 Pain in right leg: Secondary | ICD-10-CM

## 2017-02-11 DIAGNOSIS — Z6839 Body mass index (BMI) 39.0-39.9, adult: Secondary | ICD-10-CM | POA: Diagnosis not present

## 2017-02-11 DIAGNOSIS — M79661 Pain in right lower leg: Secondary | ICD-10-CM | POA: Diagnosis not present

## 2017-02-26 DIAGNOSIS — E291 Testicular hypofunction: Secondary | ICD-10-CM | POA: Diagnosis not present

## 2017-03-13 DIAGNOSIS — M9902 Segmental and somatic dysfunction of thoracic region: Secondary | ICD-10-CM | POA: Diagnosis not present

## 2017-03-13 DIAGNOSIS — M5441 Lumbago with sciatica, right side: Secondary | ICD-10-CM | POA: Diagnosis not present

## 2017-03-13 DIAGNOSIS — M9905 Segmental and somatic dysfunction of pelvic region: Secondary | ICD-10-CM | POA: Diagnosis not present

## 2017-03-13 DIAGNOSIS — M546 Pain in thoracic spine: Secondary | ICD-10-CM | POA: Diagnosis not present

## 2017-03-13 DIAGNOSIS — M9903 Segmental and somatic dysfunction of lumbar region: Secondary | ICD-10-CM | POA: Diagnosis not present

## 2017-03-19 DIAGNOSIS — E291 Testicular hypofunction: Secondary | ICD-10-CM | POA: Diagnosis not present

## 2017-04-16 DIAGNOSIS — E291 Testicular hypofunction: Secondary | ICD-10-CM | POA: Diagnosis not present

## 2017-05-07 DIAGNOSIS — E291 Testicular hypofunction: Secondary | ICD-10-CM | POA: Diagnosis not present

## 2017-05-28 DIAGNOSIS — E291 Testicular hypofunction: Secondary | ICD-10-CM | POA: Diagnosis not present

## 2017-06-18 DIAGNOSIS — E291 Testicular hypofunction: Secondary | ICD-10-CM | POA: Diagnosis not present

## 2017-07-10 DIAGNOSIS — H2513 Age-related nuclear cataract, bilateral: Secondary | ICD-10-CM | POA: Diagnosis not present

## 2017-07-10 DIAGNOSIS — H34211 Partial retinal artery occlusion, right eye: Secondary | ICD-10-CM | POA: Diagnosis not present

## 2017-07-10 DIAGNOSIS — H35033 Hypertensive retinopathy, bilateral: Secondary | ICD-10-CM | POA: Diagnosis not present

## 2017-07-10 DIAGNOSIS — E119 Type 2 diabetes mellitus without complications: Secondary | ICD-10-CM | POA: Diagnosis not present

## 2017-07-10 DIAGNOSIS — H25013 Cortical age-related cataract, bilateral: Secondary | ICD-10-CM | POA: Diagnosis not present

## 2017-07-18 DIAGNOSIS — E291 Testicular hypofunction: Secondary | ICD-10-CM | POA: Diagnosis not present

## 2017-08-04 DIAGNOSIS — E1165 Type 2 diabetes mellitus with hyperglycemia: Secondary | ICD-10-CM | POA: Diagnosis not present

## 2017-08-04 DIAGNOSIS — E291 Testicular hypofunction: Secondary | ICD-10-CM | POA: Diagnosis not present

## 2017-08-04 DIAGNOSIS — I10 Essential (primary) hypertension: Secondary | ICD-10-CM | POA: Diagnosis not present

## 2017-08-14 DIAGNOSIS — E291 Testicular hypofunction: Secondary | ICD-10-CM | POA: Diagnosis not present

## 2017-08-14 DIAGNOSIS — F411 Generalized anxiety disorder: Secondary | ICD-10-CM | POA: Diagnosis not present

## 2017-08-14 DIAGNOSIS — Z6836 Body mass index (BMI) 36.0-36.9, adult: Secondary | ICD-10-CM | POA: Diagnosis not present

## 2017-08-14 DIAGNOSIS — I10 Essential (primary) hypertension: Secondary | ICD-10-CM | POA: Diagnosis not present

## 2017-08-14 DIAGNOSIS — E1165 Type 2 diabetes mellitus with hyperglycemia: Secondary | ICD-10-CM | POA: Diagnosis not present

## 2017-08-14 DIAGNOSIS — E782 Mixed hyperlipidemia: Secondary | ICD-10-CM | POA: Diagnosis not present

## 2017-08-14 DIAGNOSIS — K219 Gastro-esophageal reflux disease without esophagitis: Secondary | ICD-10-CM | POA: Diagnosis not present

## 2017-08-17 DIAGNOSIS — L918 Other hypertrophic disorders of the skin: Secondary | ICD-10-CM | POA: Diagnosis not present

## 2017-08-17 DIAGNOSIS — Z6836 Body mass index (BMI) 36.0-36.9, adult: Secondary | ICD-10-CM | POA: Diagnosis not present

## 2017-08-28 DIAGNOSIS — Z23 Encounter for immunization: Secondary | ICD-10-CM | POA: Diagnosis not present

## 2017-08-28 DIAGNOSIS — L918 Other hypertrophic disorders of the skin: Secondary | ICD-10-CM | POA: Diagnosis not present

## 2017-08-28 DIAGNOSIS — Z6836 Body mass index (BMI) 36.0-36.9, adult: Secondary | ICD-10-CM | POA: Diagnosis not present

## 2017-09-18 DIAGNOSIS — E291 Testicular hypofunction: Secondary | ICD-10-CM | POA: Diagnosis not present

## 2017-10-02 DIAGNOSIS — E291 Testicular hypofunction: Secondary | ICD-10-CM | POA: Diagnosis not present

## 2017-10-13 DIAGNOSIS — M9903 Segmental and somatic dysfunction of lumbar region: Secondary | ICD-10-CM | POA: Diagnosis not present

## 2017-10-13 DIAGNOSIS — M9902 Segmental and somatic dysfunction of thoracic region: Secondary | ICD-10-CM | POA: Diagnosis not present

## 2017-10-13 DIAGNOSIS — M5441 Lumbago with sciatica, right side: Secondary | ICD-10-CM | POA: Diagnosis not present

## 2017-10-13 DIAGNOSIS — M9905 Segmental and somatic dysfunction of pelvic region: Secondary | ICD-10-CM | POA: Diagnosis not present

## 2017-10-13 DIAGNOSIS — M546 Pain in thoracic spine: Secondary | ICD-10-CM | POA: Diagnosis not present

## 2017-10-26 DIAGNOSIS — E291 Testicular hypofunction: Secondary | ICD-10-CM | POA: Diagnosis not present

## 2017-11-13 DIAGNOSIS — S50311A Abrasion of right elbow, initial encounter: Secondary | ICD-10-CM | POA: Diagnosis not present

## 2017-11-13 DIAGNOSIS — E291 Testicular hypofunction: Secondary | ICD-10-CM | POA: Diagnosis not present

## 2017-12-04 DIAGNOSIS — E291 Testicular hypofunction: Secondary | ICD-10-CM | POA: Diagnosis not present

## 2017-12-25 DIAGNOSIS — E291 Testicular hypofunction: Secondary | ICD-10-CM | POA: Diagnosis not present

## 2018-01-15 DIAGNOSIS — E291 Testicular hypofunction: Secondary | ICD-10-CM | POA: Diagnosis not present

## 2018-02-05 DIAGNOSIS — E291 Testicular hypofunction: Secondary | ICD-10-CM | POA: Diagnosis not present

## 2018-02-09 DIAGNOSIS — Z125 Encounter for screening for malignant neoplasm of prostate: Secondary | ICD-10-CM | POA: Diagnosis not present

## 2018-02-09 DIAGNOSIS — E291 Testicular hypofunction: Secondary | ICD-10-CM | POA: Diagnosis not present

## 2018-02-09 DIAGNOSIS — E1165 Type 2 diabetes mellitus with hyperglycemia: Secondary | ICD-10-CM | POA: Diagnosis not present

## 2018-02-09 DIAGNOSIS — Z23 Encounter for immunization: Secondary | ICD-10-CM | POA: Diagnosis not present

## 2018-02-09 DIAGNOSIS — L918 Other hypertrophic disorders of the skin: Secondary | ICD-10-CM | POA: Diagnosis not present

## 2018-02-09 DIAGNOSIS — E782 Mixed hyperlipidemia: Secondary | ICD-10-CM | POA: Diagnosis not present

## 2018-02-09 DIAGNOSIS — I10 Essential (primary) hypertension: Secondary | ICD-10-CM | POA: Diagnosis not present

## 2018-02-09 DIAGNOSIS — S50311A Abrasion of right elbow, initial encounter: Secondary | ICD-10-CM | POA: Diagnosis not present

## 2018-02-12 DIAGNOSIS — I10 Essential (primary) hypertension: Secondary | ICD-10-CM | POA: Diagnosis not present

## 2018-02-12 DIAGNOSIS — E782 Mixed hyperlipidemia: Secondary | ICD-10-CM | POA: Diagnosis not present

## 2018-02-12 DIAGNOSIS — Z0000 Encounter for general adult medical examination without abnormal findings: Secondary | ICD-10-CM | POA: Diagnosis not present

## 2018-02-12 DIAGNOSIS — K219 Gastro-esophageal reflux disease without esophagitis: Secondary | ICD-10-CM | POA: Diagnosis not present

## 2018-02-12 DIAGNOSIS — E291 Testicular hypofunction: Secondary | ICD-10-CM | POA: Diagnosis not present

## 2018-02-12 DIAGNOSIS — E119 Type 2 diabetes mellitus without complications: Secondary | ICD-10-CM | POA: Diagnosis not present

## 2018-02-12 DIAGNOSIS — G4700 Insomnia, unspecified: Secondary | ICD-10-CM | POA: Diagnosis not present

## 2018-02-12 DIAGNOSIS — F419 Anxiety disorder, unspecified: Secondary | ICD-10-CM | POA: Diagnosis not present

## 2018-03-01 DIAGNOSIS — E291 Testicular hypofunction: Secondary | ICD-10-CM | POA: Diagnosis not present

## 2018-03-15 DIAGNOSIS — M9903 Segmental and somatic dysfunction of lumbar region: Secondary | ICD-10-CM | POA: Diagnosis not present

## 2018-03-15 DIAGNOSIS — M546 Pain in thoracic spine: Secondary | ICD-10-CM | POA: Diagnosis not present

## 2018-03-15 DIAGNOSIS — M5441 Lumbago with sciatica, right side: Secondary | ICD-10-CM | POA: Diagnosis not present

## 2018-03-15 DIAGNOSIS — M9905 Segmental and somatic dysfunction of pelvic region: Secondary | ICD-10-CM | POA: Diagnosis not present

## 2018-03-15 DIAGNOSIS — M9902 Segmental and somatic dysfunction of thoracic region: Secondary | ICD-10-CM | POA: Diagnosis not present

## 2018-03-25 DIAGNOSIS — H119 Unspecified disorder of conjunctiva: Secondary | ICD-10-CM | POA: Diagnosis not present

## 2018-03-25 DIAGNOSIS — E119 Type 2 diabetes mellitus without complications: Secondary | ICD-10-CM | POA: Diagnosis not present

## 2018-03-26 DIAGNOSIS — E782 Mixed hyperlipidemia: Secondary | ICD-10-CM | POA: Diagnosis not present

## 2018-03-26 DIAGNOSIS — Z0000 Encounter for general adult medical examination without abnormal findings: Secondary | ICD-10-CM | POA: Diagnosis not present

## 2018-03-26 DIAGNOSIS — Z23 Encounter for immunization: Secondary | ICD-10-CM | POA: Diagnosis not present

## 2018-03-26 DIAGNOSIS — E119 Type 2 diabetes mellitus without complications: Secondary | ICD-10-CM | POA: Diagnosis not present

## 2018-03-26 DIAGNOSIS — S50311A Abrasion of right elbow, initial encounter: Secondary | ICD-10-CM | POA: Diagnosis not present

## 2018-03-26 DIAGNOSIS — G4700 Insomnia, unspecified: Secondary | ICD-10-CM | POA: Diagnosis not present

## 2018-03-26 DIAGNOSIS — E291 Testicular hypofunction: Secondary | ICD-10-CM | POA: Diagnosis not present

## 2018-03-26 DIAGNOSIS — K219 Gastro-esophageal reflux disease without esophagitis: Secondary | ICD-10-CM | POA: Diagnosis not present

## 2018-03-26 DIAGNOSIS — L918 Other hypertrophic disorders of the skin: Secondary | ICD-10-CM | POA: Diagnosis not present

## 2018-03-26 DIAGNOSIS — I10 Essential (primary) hypertension: Secondary | ICD-10-CM | POA: Diagnosis not present

## 2018-03-26 DIAGNOSIS — F419 Anxiety disorder, unspecified: Secondary | ICD-10-CM | POA: Diagnosis not present

## 2018-04-05 DIAGNOSIS — M9902 Segmental and somatic dysfunction of thoracic region: Secondary | ICD-10-CM | POA: Diagnosis not present

## 2018-04-05 DIAGNOSIS — M5441 Lumbago with sciatica, right side: Secondary | ICD-10-CM | POA: Diagnosis not present

## 2018-04-05 DIAGNOSIS — M9903 Segmental and somatic dysfunction of lumbar region: Secondary | ICD-10-CM | POA: Diagnosis not present

## 2018-04-05 DIAGNOSIS — M546 Pain in thoracic spine: Secondary | ICD-10-CM | POA: Diagnosis not present

## 2018-04-05 DIAGNOSIS — M9905 Segmental and somatic dysfunction of pelvic region: Secondary | ICD-10-CM | POA: Diagnosis not present

## 2018-04-16 DIAGNOSIS — Z0000 Encounter for general adult medical examination without abnormal findings: Secondary | ICD-10-CM | POA: Diagnosis not present

## 2018-04-16 DIAGNOSIS — E782 Mixed hyperlipidemia: Secondary | ICD-10-CM | POA: Diagnosis not present

## 2018-04-16 DIAGNOSIS — F419 Anxiety disorder, unspecified: Secondary | ICD-10-CM | POA: Diagnosis not present

## 2018-04-16 DIAGNOSIS — K219 Gastro-esophageal reflux disease without esophagitis: Secondary | ICD-10-CM | POA: Diagnosis not present

## 2018-04-16 DIAGNOSIS — L918 Other hypertrophic disorders of the skin: Secondary | ICD-10-CM | POA: Diagnosis not present

## 2018-04-16 DIAGNOSIS — E119 Type 2 diabetes mellitus without complications: Secondary | ICD-10-CM | POA: Diagnosis not present

## 2018-04-16 DIAGNOSIS — G4700 Insomnia, unspecified: Secondary | ICD-10-CM | POA: Diagnosis not present

## 2018-04-16 DIAGNOSIS — S50311A Abrasion of right elbow, initial encounter: Secondary | ICD-10-CM | POA: Diagnosis not present

## 2018-04-16 DIAGNOSIS — I10 Essential (primary) hypertension: Secondary | ICD-10-CM | POA: Diagnosis not present

## 2018-04-16 DIAGNOSIS — E291 Testicular hypofunction: Secondary | ICD-10-CM | POA: Diagnosis not present

## 2018-04-16 DIAGNOSIS — Z23 Encounter for immunization: Secondary | ICD-10-CM | POA: Diagnosis not present

## 2018-04-27 DIAGNOSIS — S30860A Insect bite (nonvenomous) of lower back and pelvis, initial encounter: Secondary | ICD-10-CM | POA: Diagnosis not present

## 2018-04-27 DIAGNOSIS — E119 Type 2 diabetes mellitus without complications: Secondary | ICD-10-CM | POA: Diagnosis not present

## 2018-04-27 DIAGNOSIS — S50311A Abrasion of right elbow, initial encounter: Secondary | ICD-10-CM | POA: Diagnosis not present

## 2018-04-27 DIAGNOSIS — Z0000 Encounter for general adult medical examination without abnormal findings: Secondary | ICD-10-CM | POA: Diagnosis not present

## 2018-04-27 DIAGNOSIS — L918 Other hypertrophic disorders of the skin: Secondary | ICD-10-CM | POA: Diagnosis not present

## 2018-04-27 DIAGNOSIS — F419 Anxiety disorder, unspecified: Secondary | ICD-10-CM | POA: Diagnosis not present

## 2018-04-27 DIAGNOSIS — E291 Testicular hypofunction: Secondary | ICD-10-CM | POA: Diagnosis not present

## 2018-04-27 DIAGNOSIS — I10 Essential (primary) hypertension: Secondary | ICD-10-CM | POA: Diagnosis not present

## 2018-04-27 DIAGNOSIS — Z23 Encounter for immunization: Secondary | ICD-10-CM | POA: Diagnosis not present

## 2018-04-27 DIAGNOSIS — K219 Gastro-esophageal reflux disease without esophagitis: Secondary | ICD-10-CM | POA: Diagnosis not present

## 2018-04-27 DIAGNOSIS — G4700 Insomnia, unspecified: Secondary | ICD-10-CM | POA: Diagnosis not present

## 2018-04-27 DIAGNOSIS — E782 Mixed hyperlipidemia: Secondary | ICD-10-CM | POA: Diagnosis not present

## 2018-05-02 ENCOUNTER — Inpatient Hospital Stay (HOSPITAL_COMMUNITY)
Admission: EM | Admit: 2018-05-02 | Discharge: 2018-05-17 | DRG: 233 | Disposition: A | Discharge status: Home / Self Care | Payer: Medicare Other | Attending: Cardiothoracic Surgery | Admitting: Cardiothoracic Surgery

## 2018-05-02 ENCOUNTER — Emergency Department (HOSPITAL_COMMUNITY): Payer: Medicare Other

## 2018-05-02 ENCOUNTER — Encounter (HOSPITAL_COMMUNITY): Payer: Self-pay | Admitting: *Deleted

## 2018-05-02 ENCOUNTER — Other Ambulatory Visit: Payer: Self-pay

## 2018-05-02 DIAGNOSIS — Z6835 Body mass index (BMI) 35.0-35.9, adult: Secondary | ICD-10-CM | POA: Diagnosis not present

## 2018-05-02 DIAGNOSIS — I340 Nonrheumatic mitral (valve) insufficiency: Secondary | ICD-10-CM | POA: Diagnosis not present

## 2018-05-02 DIAGNOSIS — E7800 Pure hypercholesterolemia, unspecified: Secondary | ICD-10-CM | POA: Diagnosis present

## 2018-05-02 DIAGNOSIS — I10 Essential (primary) hypertension: Secondary | ICD-10-CM | POA: Diagnosis present

## 2018-05-02 DIAGNOSIS — I517 Cardiomegaly: Secondary | ICD-10-CM | POA: Diagnosis not present

## 2018-05-02 DIAGNOSIS — E119 Type 2 diabetes mellitus without complications: Secondary | ICD-10-CM

## 2018-05-02 DIAGNOSIS — I493 Ventricular premature depolarization: Secondary | ICD-10-CM | POA: Diagnosis not present

## 2018-05-02 DIAGNOSIS — I080 Rheumatic disorders of both mitral and aortic valves: Secondary | ICD-10-CM | POA: Diagnosis not present

## 2018-05-02 DIAGNOSIS — J810 Acute pulmonary edema: Secondary | ICD-10-CM | POA: Diagnosis not present

## 2018-05-02 DIAGNOSIS — I214 Non-ST elevation (NSTEMI) myocardial infarction: Secondary | ICD-10-CM | POA: Diagnosis not present

## 2018-05-02 DIAGNOSIS — I350 Nonrheumatic aortic (valve) stenosis: Secondary | ICD-10-CM | POA: Diagnosis not present

## 2018-05-02 DIAGNOSIS — I700 Atherosclerosis of aorta: Secondary | ICD-10-CM | POA: Diagnosis not present

## 2018-05-02 DIAGNOSIS — E785 Hyperlipidemia, unspecified: Secondary | ICD-10-CM | POA: Diagnosis not present

## 2018-05-02 DIAGNOSIS — J90 Pleural effusion, not elsewhere classified: Secondary | ICD-10-CM | POA: Diagnosis not present

## 2018-05-02 DIAGNOSIS — I5031 Acute diastolic (congestive) heart failure: Secondary | ICD-10-CM | POA: Diagnosis not present

## 2018-05-02 DIAGNOSIS — Z87442 Personal history of urinary calculi: Secondary | ICD-10-CM | POA: Diagnosis not present

## 2018-05-02 DIAGNOSIS — A770 Spotted fever due to Rickettsia rickettsii: Secondary | ICD-10-CM | POA: Diagnosis not present

## 2018-05-02 DIAGNOSIS — K219 Gastro-esophageal reflux disease without esophagitis: Secondary | ICD-10-CM | POA: Diagnosis not present

## 2018-05-02 DIAGNOSIS — Z7982 Long term (current) use of aspirin: Secondary | ICD-10-CM | POA: Diagnosis not present

## 2018-05-02 DIAGNOSIS — N183 Chronic kidney disease, stage 3 unspecified: Secondary | ICD-10-CM

## 2018-05-02 DIAGNOSIS — J9811 Atelectasis: Secondary | ICD-10-CM | POA: Diagnosis not present

## 2018-05-02 DIAGNOSIS — E1122 Type 2 diabetes mellitus with diabetic chronic kidney disease: Secondary | ICD-10-CM | POA: Diagnosis not present

## 2018-05-02 DIAGNOSIS — I2510 Atherosclerotic heart disease of native coronary artery without angina pectoris: Secondary | ICD-10-CM | POA: Diagnosis not present

## 2018-05-02 DIAGNOSIS — I110 Hypertensive heart disease with heart failure: Secondary | ICD-10-CM | POA: Diagnosis not present

## 2018-05-02 DIAGNOSIS — Z8249 Family history of ischemic heart disease and other diseases of the circulatory system: Secondary | ICD-10-CM

## 2018-05-02 DIAGNOSIS — I130 Hypertensive heart and chronic kidney disease with heart failure and stage 1 through stage 4 chronic kidney disease, or unspecified chronic kidney disease: Secondary | ICD-10-CM | POA: Diagnosis present

## 2018-05-02 DIAGNOSIS — E1165 Type 2 diabetes mellitus with hyperglycemia: Secondary | ICD-10-CM | POA: Diagnosis not present

## 2018-05-02 DIAGNOSIS — N289 Disorder of kidney and ureter, unspecified: Secondary | ICD-10-CM | POA: Diagnosis not present

## 2018-05-02 DIAGNOSIS — E1140 Type 2 diabetes mellitus with diabetic neuropathy, unspecified: Secondary | ICD-10-CM | POA: Diagnosis not present

## 2018-05-02 DIAGNOSIS — Z01810 Encounter for preprocedural cardiovascular examination: Secondary | ICD-10-CM | POA: Diagnosis not present

## 2018-05-02 DIAGNOSIS — Z7984 Long term (current) use of oral hypoglycemic drugs: Secondary | ICD-10-CM

## 2018-05-02 DIAGNOSIS — Z833 Family history of diabetes mellitus: Secondary | ICD-10-CM

## 2018-05-02 DIAGNOSIS — I509 Heart failure, unspecified: Secondary | ICD-10-CM | POA: Diagnosis not present

## 2018-05-02 DIAGNOSIS — Z951 Presence of aortocoronary bypass graft: Secondary | ICD-10-CM

## 2018-05-02 DIAGNOSIS — E1149 Type 2 diabetes mellitus with other diabetic neurological complication: Secondary | ICD-10-CM

## 2018-05-02 DIAGNOSIS — Q245 Malformation of coronary vessels: Secondary | ICD-10-CM | POA: Diagnosis not present

## 2018-05-02 DIAGNOSIS — R0602 Shortness of breath: Secondary | ICD-10-CM | POA: Diagnosis not present

## 2018-05-02 DIAGNOSIS — E669 Obesity, unspecified: Secondary | ICD-10-CM | POA: Diagnosis not present

## 2018-05-02 DIAGNOSIS — R05 Cough: Secondary | ICD-10-CM | POA: Diagnosis not present

## 2018-05-02 DIAGNOSIS — I4891 Unspecified atrial fibrillation: Secondary | ICD-10-CM | POA: Diagnosis not present

## 2018-05-02 DIAGNOSIS — R0609 Other forms of dyspnea: Secondary | ICD-10-CM

## 2018-05-02 DIAGNOSIS — I5033 Acute on chronic diastolic (congestive) heart failure: Secondary | ICD-10-CM | POA: Diagnosis not present

## 2018-05-02 DIAGNOSIS — D72829 Elevated white blood cell count, unspecified: Secondary | ICD-10-CM | POA: Diagnosis not present

## 2018-05-02 DIAGNOSIS — Z4589 Encounter for adjustment and management of other implanted devices: Secondary | ICD-10-CM | POA: Diagnosis not present

## 2018-05-02 DIAGNOSIS — Z794 Long term (current) use of insulin: Secondary | ICD-10-CM | POA: Diagnosis not present

## 2018-05-02 DIAGNOSIS — I5021 Acute systolic (congestive) heart failure: Secondary | ICD-10-CM | POA: Diagnosis not present

## 2018-05-02 DIAGNOSIS — E1159 Type 2 diabetes mellitus with other circulatory complications: Secondary | ICD-10-CM | POA: Diagnosis not present

## 2018-05-02 HISTORY — DX: Atherosclerotic heart disease of native coronary artery without angina pectoris: I25.10

## 2018-05-02 HISTORY — DX: Chronic kidney disease, stage 3 unspecified: N18.30

## 2018-05-02 HISTORY — DX: Nonrheumatic aortic (valve) stenosis: I35.0

## 2018-05-02 HISTORY — DX: Hyperlipidemia, unspecified: E78.5

## 2018-05-02 HISTORY — DX: Type 2 diabetes mellitus without complications: E11.9

## 2018-05-02 HISTORY — DX: Personal history of urinary calculi: Z87.442

## 2018-05-02 HISTORY — DX: Chronic kidney disease, stage 3 (moderate): N18.3

## 2018-05-02 HISTORY — DX: Chronic diastolic (congestive) heart failure: I50.32

## 2018-05-02 LAB — COMPREHENSIVE METABOLIC PANEL
ALK PHOS: 62 U/L (ref 38–126)
ALT: 16 U/L — AB (ref 17–63)
AST: 25 U/L (ref 15–41)
Albumin: 3.8 g/dL (ref 3.5–5.0)
Anion gap: 19 — ABNORMAL HIGH (ref 5–15)
BUN: 20 mg/dL (ref 6–20)
CHLORIDE: 103 mmol/L (ref 101–111)
CO2: 16 mmol/L — AB (ref 22–32)
Calcium: 8.6 mg/dL — ABNORMAL LOW (ref 8.9–10.3)
Creatinine, Ser: 1.51 mg/dL — ABNORMAL HIGH (ref 0.61–1.24)
GFR calc Af Amer: 50 mL/min — ABNORMAL LOW (ref >60)
GFR, EST NON AFRICAN AMERICAN: 43 mL/min — AB (ref >60)
Glucose, Bld: 297 mg/dL — ABNORMAL HIGH (ref 65–99)
Potassium: 3.3 mmol/L — ABNORMAL LOW (ref 3.5–5.1)
Sodium: 138 mmol/L (ref 135–145)
Total Bilirubin: 0.6 mg/dL (ref 0.3–1.2)
Total Protein: 7.3 g/dL (ref 6.5–8.1)

## 2018-05-02 LAB — BRAIN NATRIURETIC PEPTIDE: B Natriuretic Peptide: 632 pg/mL — ABNORMAL HIGH (ref 0.0–100.0)

## 2018-05-02 LAB — CBC WITH DIFFERENTIAL/PLATELET
Basophils Absolute: 0 10*3/uL (ref 0.0–0.1)
Basophils Relative: 0 %
EOS ABS: 0.1 10*3/uL (ref 0.0–0.7)
Eosinophils Relative: 1 %
HCT: 44.8 % (ref 39.0–52.0)
HEMOGLOBIN: 14.3 g/dL (ref 13.0–17.0)
LYMPHS ABS: 7.8 10*3/uL — AB (ref 0.7–4.0)
Lymphocytes Relative: 40 %
MCH: 29.5 pg (ref 26.0–34.0)
MCHC: 31.9 g/dL (ref 30.0–36.0)
MCV: 92.4 fL (ref 78.0–100.0)
Monocytes Absolute: 2.5 10*3/uL — ABNORMAL HIGH (ref 0.1–1.0)
Monocytes Relative: 13 %
NEUTROS ABS: 9.2 10*3/uL — AB (ref 1.7–7.7)
NEUTROS PCT: 46 %
PLATELETS: 325 10*3/uL (ref 150–400)
RBC: 4.85 MIL/uL (ref 4.22–5.81)
RDW: 16 % — ABNORMAL HIGH (ref 11.5–15.5)
WBC: 19.6 10*3/uL — AB (ref 4.0–10.5)

## 2018-05-02 LAB — TROPONIN I
TROPONIN I: 0.03 ng/mL — AB (ref <0.03)
Troponin I: 0.41 ng/mL (ref <0.03)

## 2018-05-02 LAB — D-DIMER, QUANTITATIVE: D-Dimer, Quant: 1.13 ug/mL-FEU — ABNORMAL HIGH (ref 0.00–0.50)

## 2018-05-02 LAB — GLUCOSE, CAPILLARY: Glucose-Capillary: 244 mg/dL — ABNORMAL HIGH (ref 65–99)

## 2018-05-02 MED ORDER — ALBUTEROL (5 MG/ML) CONTINUOUS INHALATION SOLN
10.0000 mg/h | INHALATION_SOLUTION | RESPIRATORY_TRACT | Status: DC
Start: 2018-05-02 — End: 2018-05-11
  Administered 2018-05-02: 10 mg/h via RESPIRATORY_TRACT
  Filled 2018-05-02: qty 20

## 2018-05-02 MED ORDER — CLONIDINE HCL 0.1 MG PO TABS
0.1000 mg | ORAL_TABLET | Freq: Once | ORAL | Status: AC
  Administered 2018-05-02: 0.1 mg via ORAL
  Filled 2018-05-02: qty 1

## 2018-05-02 MED ORDER — IRBESARTAN 300 MG PO TABS
300.0000 mg | ORAL_TABLET | Freq: Once | ORAL | Status: AC
  Administered 2018-05-02: 300 mg via ORAL
  Filled 2018-05-02: qty 1

## 2018-05-02 MED ORDER — NEBIVOLOL HCL 2.5 MG PO TABS
ORAL_TABLET | ORAL | Status: AC
  Filled 2018-05-02: qty 2

## 2018-05-02 MED ORDER — NITROGLYCERIN IN D5W 200-5 MCG/ML-% IV SOLN
5.0000 ug/min | Freq: Once | INTRAVENOUS | Status: AC
  Administered 2018-05-02: 10 ug/min via INTRAVENOUS

## 2018-05-02 MED ORDER — ASPIRIN EC 81 MG PO TBEC
81.0000 mg | DELAYED_RELEASE_TABLET | Freq: Every day | ORAL | Status: DC
  Administered 2018-05-03: 81 mg via ORAL
  Filled 2018-05-02: qty 1

## 2018-05-02 MED ORDER — INSULIN ASPART 100 UNIT/ML ~~LOC~~ SOLN
0.0000 [IU] | Freq: Three times a day (TID) | SUBCUTANEOUS | Status: DC
  Administered 2018-05-03: 3 [IU] via SUBCUTANEOUS
  Administered 2018-05-03: 5 [IU] via SUBCUTANEOUS
  Administered 2018-05-03: 2 [IU] via SUBCUTANEOUS
  Administered 2018-05-04: 5 [IU] via SUBCUTANEOUS
  Administered 2018-05-05 – 2018-05-06 (×4): 2 [IU] via SUBCUTANEOUS
  Administered 2018-05-06 – 2018-05-07 (×2): 3 [IU] via SUBCUTANEOUS
  Administered 2018-05-07: 2 [IU] via SUBCUTANEOUS
  Administered 2018-05-07: 3 [IU] via SUBCUTANEOUS
  Administered 2018-05-08: 5 [IU] via SUBCUTANEOUS
  Administered 2018-05-08: 2 [IU] via SUBCUTANEOUS
  Administered 2018-05-09 – 2018-05-10 (×5): 3 [IU] via SUBCUTANEOUS

## 2018-05-02 MED ORDER — HYDRALAZINE HCL 25 MG PO TABS
25.0000 mg | ORAL_TABLET | Freq: Once | ORAL | Status: AC
  Administered 2018-05-02: 25 mg via ORAL
  Filled 2018-05-02: qty 1

## 2018-05-02 MED ORDER — ALBUTEROL SULFATE (2.5 MG/3ML) 0.083% IN NEBU: 5.0000 mg | INHALATION_SOLUTION | Freq: Once | RESPIRATORY_TRACT | Status: DC

## 2018-05-02 MED ORDER — FUROSEMIDE 40 MG PO TABS
40.0000 mg | ORAL_TABLET | Freq: Two times a day (BID) | ORAL | Status: DC
  Administered 2018-05-03 – 2018-05-10 (×16): 40 mg via ORAL
  Filled 2018-05-02 (×16): qty 1

## 2018-05-02 MED ORDER — HYDRALAZINE HCL 25 MG PO TABS
25.0000 mg | ORAL_TABLET | Freq: Every day | ORAL | Status: DC
  Filled 2018-05-02: qty 1

## 2018-05-02 MED ORDER — PANTOPRAZOLE SODIUM 40 MG PO TBEC
40.0000 mg | DELAYED_RELEASE_TABLET | Freq: Every day | ORAL | Status: DC
  Administered 2018-05-03 – 2018-05-10 (×8): 40 mg via ORAL
  Filled 2018-05-02 (×8): qty 1

## 2018-05-02 MED ORDER — ONDANSETRON HCL 4 MG/2ML IJ SOLN: 4.0000 mg | Freq: Four times a day (QID) | INTRAMUSCULAR | Status: DC | PRN

## 2018-05-02 MED ORDER — ALPRAZOLAM 0.5 MG PO TABS
0.5000 mg | ORAL_TABLET | Freq: Every evening | ORAL | Status: DC | PRN
  Administered 2018-05-02 – 2018-05-04 (×3): 0.5 mg via ORAL
  Filled 2018-05-02 (×3): qty 1

## 2018-05-02 MED ORDER — IOPAMIDOL (ISOVUE-370) INJECTION 76%
80.0000 mL | Freq: Once | INTRAVENOUS | Status: AC | PRN
  Administered 2018-05-02: 80 mL via INTRAVENOUS

## 2018-05-02 MED ORDER — SODIUM CHLORIDE 0.9% FLUSH: 3.0000 mL | INTRAVENOUS | Status: DC | PRN

## 2018-05-02 MED ORDER — FUROSEMIDE 10 MG/ML IJ SOLN
80.0000 mg | Freq: Once | INTRAMUSCULAR | Status: AC
  Administered 2018-05-02: 80 mg via INTRAVENOUS
  Filled 2018-05-02: qty 8

## 2018-05-02 MED ORDER — CLONIDINE HCL 0.1 MG PO TABS
0.1000 mg | ORAL_TABLET | Freq: Two times a day (BID) | ORAL | Status: DC
  Administered 2018-05-03 – 2018-05-05 (×6): 0.1 mg via ORAL
  Filled 2018-05-02 (×7): qty 1

## 2018-05-02 MED ORDER — SODIUM CHLORIDE 0.9 % IV SOLN: 250.0000 mL | INTRAVENOUS | Status: DC | PRN

## 2018-05-02 MED ORDER — SODIUM CHLORIDE 0.9% FLUSH
3.0000 mL | Freq: Two times a day (BID) | INTRAVENOUS | Status: DC
  Administered 2018-05-02 – 2018-05-03 (×2): 3 mL via INTRAVENOUS

## 2018-05-02 MED ORDER — ACETAMINOPHEN 325 MG PO TABS: 650.0000 mg | ORAL_TABLET | ORAL | Status: DC | PRN

## 2018-05-02 MED ORDER — INSULIN ASPART 100 UNIT/ML ~~LOC~~ SOLN
0.0000 [IU] | Freq: Every day | SUBCUTANEOUS | Status: DC
  Administered 2018-05-02: 2 [IU] via SUBCUTANEOUS

## 2018-05-02 MED ORDER — METHYLPREDNISOLONE SODIUM SUCC 125 MG IJ SOLR
125.0000 mg | Freq: Once | INTRAMUSCULAR | Status: AC
  Administered 2018-05-02: 125 mg via INTRAVENOUS
  Filled 2018-05-02: qty 2

## 2018-05-02 MED ORDER — NITROGLYCERIN IN D5W 200-5 MCG/ML-% IV SOLN
5.0000 ug/min | Freq: Once | INTRAVENOUS | Status: AC
  Administered 2018-05-02: 5 ug/min via INTRAVENOUS
  Filled 2018-05-02: qty 250

## 2018-05-02 MED ORDER — NEBIVOLOL HCL 5 MG PO TABS
5.0000 mg | ORAL_TABLET | Freq: Every day | ORAL | Status: DC
  Administered 2018-05-03 – 2018-05-05 (×3): 5 mg via ORAL
  Filled 2018-05-02 (×4): qty 1

## 2018-05-02 MED ORDER — ENOXAPARIN SODIUM 40 MG/0.4ML ~~LOC~~ SOLN
40.0000 mg | SUBCUTANEOUS | Status: DC
  Administered 2018-05-02 – 2018-05-04 (×3): 40 mg via SUBCUTANEOUS
  Filled 2018-05-02 (×3): qty 0.4

## 2018-05-02 MED ORDER — POTASSIUM CHLORIDE CRYS ER 20 MEQ PO TBCR
20.0000 meq | EXTENDED_RELEASE_TABLET | Freq: Two times a day (BID) | ORAL | Status: DC
  Administered 2018-05-02 – 2018-05-10 (×17): 20 meq via ORAL
  Filled 2018-05-02 (×17): qty 1

## 2018-05-02 MED ORDER — NEBIVOLOL HCL 5 MG PO TABS
5.0000 mg | ORAL_TABLET | Freq: Once | ORAL | Status: AC
  Administered 2018-05-02: 5 mg via ORAL
  Filled 2018-05-02: qty 1

## 2018-05-02 NOTE — ED Triage Notes (Signed)
Pt with sob at rest since yesterday, pt denies pain. Pt drove self here in this state.

## 2018-05-02 NOTE — ED Provider Notes (Addendum)
[facility] EMERGENCY DEPARTMENT Provider Note   CSN: [id number] Arrival date & time: 05/02/18  1803     History   Chief Complaint Chief Complaint  Patient presents with  . Shortness of Breath    HPI Alejandro [name] is a 77 y.o. male.  Complaint is sudden onset shortness of breath  HPI: 77 year old male.  No reported heart or lung disease.  History of CKD, DM-2, high cholesterol, HTN, IBS, GERD.  He attributes his symptoms to a "I got tick bit".  She states he was bitten by a tick on his left side.  Was seen at urgent care.  Apparently had lead testing.  He went back today for results and was told "everything was fine".  He states his been little short of breath for a week.  He attributed this to the tick bite.  He was sitting at home proximal 3:00's afternoon had a sudden onset of a feeling of severe shortness of breath.  No chest pain.  No fever.  No recent cough.  No leg swelling or edema.  Is not had rash since he developed the tick bite.  He drove himself here.  He states he "did not think it was good to make it".  He was near syncopal and lightheaded.  He arrives with a saturation of 60%, pallor, diaphoresis.  Past Medical History:  Diagnosis Date  . Chronic kidney disease 2004   kidney stones  . Diabetes mellitus   . High cholesterol   . Hypertension   . Hypertension   . IBS (irritable colon syndrome)   . Reflux     Patient Active Problem List   Diagnosis Date Noted  . Diabetic neuropathy with neurologic complication ([place]) 43/32/9518  . Spinal stenosis of lumbar region 03/29/2013  . Melena 03/02/2013  . GERD (gastroesophageal reflux disease) 09/02/2012  . Helicobacter pylori (H. pylori) 09/02/2012  . Hypertension 01/08/2012  . Diabetes mellitus 01/08/2012  . High cholesterol     Past Surgical History:  Procedure Laterality Date  . ESOPHAGOGASTRODUODENOSCOPY  02/04/2012   Procedure: ESOPHAGOGASTRODUODENOSCOPY (EGD);  Surgeon: Rogene Houston, MD;  Location: [facility]  ENDO SUITE;  Service: Endoscopy;  Laterality: N/A;  100  . nasal fx          Home Medications    Prior to Admission medications   Medication Sig Start Date End Date Taking? Authorizing Provider  ALPRAZolam Duanne Moron) 0.5 MG tablet Take 0.5 mg by mouth at bedtime as needed for sleep.   Yes [provider]  aspirin EC 81 MG tablet Take 81 mg by mouth daily.   Yes [provider]  azithromycin (ZITHROMAX) 250 MG tablet Take 250 mg by mouth daily. Take 2 tablets on 1st day and 1 tablet daily until finished   Yes [provider]  cloNIDine (CATAPRES) 0.1 MG tablet Take 0.1 mg by mouth 2 (two) times daily.    Yes [provider]  DEXILANT 60 MG capsule TAKE ONE CAPSULE BY MOUTH EVERY DAY. 12/25/14  Yes Rehman, Mechele Dawley, MD  hydrALAZINE (APRESOLINE) 25 MG tablet Take 25 mg by mouth daily.   Yes [provider]  nebivolol (BYSTOLIC) 5 MG tablet Take 5 mg by mouth daily.    Yes [provider]  sitaGLIPtan-metformin (JANUMET) 50-1000 MG per tablet Take 1 tablet by mouth 2 (two) times daily with a meal.     Yes [provider]    Family History Family History  Problem Relation Age of Onset  .  Heart disease Unknown   . Arthritis Unknown   . Asthma Unknown   . Diabetes Unknown     Social History Social History   Tobacco Use  . Smoking status: Never Smoker  . Smokeless tobacco: Never Used  Substance Use Topics  . Alcohol use: No    Alcohol/week: 0.0 oz  . Drug use: No     Allergies   Patient has no known allergies.   Review of Systems Review of Systems  Constitutional: Negative for appetite change, chills, diaphoresis, fatigue and fever.  HENT: Negative for mouth sores, sore throat and trouble swallowing.   Eyes: Negative for visual disturbance.  Respiratory: Positive for cough and shortness of breath. Negative for chest tightness and wheezing.   Cardiovascular: Negative for chest pain.  Gastrointestinal: Negative for  abdominal distention, abdominal pain, diarrhea, nausea and vomiting.  Endocrine: Negative for polydipsia, polyphagia and polyuria.  Genitourinary: Negative for dysuria, frequency and hematuria.  Musculoskeletal: Negative for gait problem.  Skin: Negative for color change, pallor and rash.  Neurological: Negative for dizziness, syncope, light-headedness and headaches.  Hematological: Does not bruise/bleed easily.  Psychiatric/Behavioral: Negative for behavioral problems and confusion.     Physical Exam Updated Vital Signs BP 135/74   Pulse (!) 106   Resp (!) 24   Ht 5\' 11"  (1.803 m)   Wt 120.7 kg (266 lb)   SpO2 94%   BMI 37.10 kg/m   Physical Exam  Constitutional: He is oriented to person, place, and time. No distress.  77 year old male.  Dyspneic.  Has been on O2 mask for approximately 5 minutes when I evaluate him.  His saturations are 90 on 15 L.  He is diaphoretic.  HENT:  Head: Normocephalic.  Conjunctive are not pale.  Oropharynx pink and moist.  No JVD  Eyes: Pupils are equal, round, and reactive to light. Conjunctivae are normal. No scleral icterus.  Neck: Normal range of motion. Neck supple. No thyromegaly present.  Cardiovascular: Regular rhythm. Exam reveals no gallop and no friction rub.  No murmur heard. Tachycardic.  Regular.  Sinus tach on the monitor  Pulmonary/Chest: Effort normal and breath sounds normal. No respiratory distress. He has no wheezes. He has no rales.  Diffuse rhonchi.  Basilar crackles.  Tachypneic.  Abdominal: Soft. Bowel sounds are normal. He exhibits no distension. There is no tenderness. There is no rebound.  Musculoskeletal: Normal range of motion.  Neurological: He is alert and oriented to person, place, and time.  Skin: Skin is warm and dry. No rash noted.  No lower extremity edema  Psychiatric: He has a normal mood and affect. His behavior is normal.     ED Treatments / Results  Labs (all labs ordered are listed, but only abnormal  results are displayed) Labs Reviewed  CBC WITH DIFFERENTIAL/PLATELET - Abnormal; Notable for the following components:      Result Value   WBC 19.6 (*)    RDW 16.0 (*)    Neutro Abs 9.2 (*)    Lymphs Abs 7.8 (*)    Monocytes Absolute 2.5 (*)    All other components within normal limits  COMPREHENSIVE METABOLIC PANEL - Abnormal; Notable for the following components:   Potassium 3.3 (*)    CO2 16 (*)    Glucose, Bld 297 (*)    Creatinine, Ser 1.51 (*)    Calcium 8.6 (*)    ALT 16 (*)    GFR calc non Af Amer 43 (*)    GFR calc Af  Amer 50 (*)    Anion gap 19 (*)    All other components within normal limits  TROPONIN I - Abnormal; Notable for the following components:   Troponin I 0.03 (*)    All other components within normal limits  D-DIMER, QUANTITATIVE (NOT AT [facility]) - Abnormal; Notable for the following components:   D-Dimer, Quant 1.13 (*)    All other components within normal limits  BRAIN NATRIURETIC PEPTIDE - Abnormal; Notable for the following components:   B Natriuretic Peptide 632.0 (*)    All other components within normal limits    EKG EKG Interpretation  Date/Time:  Sunday May 02 2018 20:08:11 EDT Ventricular Rate:  112 PR Interval:    QRS Duration: 110 QT Interval:  345 QTC Calculation: 471 R Axis:   47 Text Interpretation:  Sinus tachycardia Minimal ST depression, lateral leads IMproved vs Earlier tracing. Confirmed by Tanna Furry [phone]) on 05/02/2018 8:12:01 PM   Radiology Ct Angio Chest Pe W And/or Wo Contrast  Result Date: 05/02/2018 CLINICAL DATA:  Shortness of breath. PE suspected, intermediate prob, positive D-dimer EXAM: CT ANGIOGRAPHY CHEST WITH CONTRAST TECHNIQUE: Multidetector CT imaging of the chest was performed using the standard protocol during bolus administration of intravenous contrast. Multiplanar CT image reconstructions and MIPs were obtained to evaluate the vascular anatomy. CONTRAST:  31mL ISOVUE-370 IOPAMIDOL (ISOVUE-370) INJECTION 76%  COMPARISON:  Chest radiograph earlier this day. FINDINGS: Cardiovascular: Suboptimal contrast bolus for evaluation of the pulmonary arteries distal to the lobar level, no filling defects within the main or lobar pulmonary arteries. Respiratory motion further degrades evaluation. Mild cardiomegaly. There are coronary artery calcifications. Normal caliber thoracic aorta without dissection. Mild atherosclerosis and tortuosity. No pericardial effusion. Mediastinum/Nodes: Prominent mediastinal nodes at multiple stations, including 14 mm left paratracheal, 15 mm lower right paratracheal, 11 mm prevascular and 13 mm subcarinal nodes. Few prominent bilateral hilar nodes. No thyroid nodule. The esophagus is decompressed. Lungs/Pleura: Small to moderate bilateral pleural effusions. Multifocal patchy ground-glass opacities with smooth septal thickening, likely pulmonary edema. Compressive atelectasis adjacent to pleural effusions. No evident pulmonary mass. Trachea mainstem bronchi are patent. Upper Abdomen: No acute findings. Musculoskeletal: There are no acute or suspicious osseous abnormalities. Remote right anterior rib fractures. Review of the MIP images confirms the above findings. IMPRESSION: 1. No central pulmonary embolus, evaluation distal to the lobar level is limited due to contrast bolus timing and soft tissue attenuation from habitus. 2. Small to moderate bilateral pleural effusions. Ground-glass opacities and smooth septal thickening likely pulmonary edema. Findings consistent with CHF. 3. Shoddy mediastinal adenopathy is likely secondary to CHF. 4. Coronary artery calcifications. Aortic Atherosclerosis (ICD10-I70.0). Electronically Signed   By: Jeb Levering M.D.   On: 05/02/2018 20:59   Dg Chest Portable 1 View  Result Date: 05/02/2018 CLINICAL DATA:  Shortness of breath EXAM: PORTABLE CHEST 1 VIEW COMPARISON:  05/18/2014 FINDINGS: Cardiac shadow is enlarged. Vascular congestion is noted consistent with  mild CHF. No significant edema is seen. Old rib fractures are noted on the right with healing. No other focal abnormality is seen. IMPRESSION: Changes of CHF. Electronically Signed   By: Inez Catalina M.D.   On: 05/02/2018 18:39    Procedures Procedures (including critical care time)  Medications Ordered in ED Medications  albuterol (PROVENTIL) (2.5 MG/3ML) 0.083% nebulizer solution 5 mg (5 mg Nebulization Not Given 05/02/18 1830)  albuterol (PROVENTIL,VENTOLIN) solution continuous neb (10 mg/hr Nebulization New Bag/Given 05/02/18 1837)  methylPREDNISolone sodium succinate (SOLU-MEDROL) 125 mg/2 mL injection 125 mg (  125 mg Intravenous Given 05/02/18 1834)  furosemide (LASIX) injection 80 mg (80 mg Intravenous Given 05/02/18 1935)  nitroGLYCERIN 50 mg in dextrose 5 % 250 mL (0.2 mg/mL) infusion (5 mcg/min Intravenous New Bag/Given 05/02/18 1941)  irbesartan (AVAPRO) tablet 300 mg (300 mg Oral Given 05/02/18 1934)  nebivolol (BYSTOLIC) tablet 5 mg (5 mg Oral Given 05/02/18 1935)  hydrALAZINE (APRESOLINE) tablet 25 mg (25 mg Oral Given 05/02/18 1935)  cloNIDine (CATAPRES) tablet 0.1 mg (0.1 mg Oral Given 05/02/18 1935)  iopamidol (ISOVUE-370) 76 % injection 80 mL (80 mLs Intravenous Contrast Given 05/02/18 2020)     Initial Impression / Assessment and Plan / ED Course  I have reviewed the triage vital signs and the nursing notes.  Pertinent labs & imaging results that were available during my care of the patient were reviewed by me and considered in my medical decision making (see chart for details).   EKG shows sinus tach.  No ST elevations.  He does have some diffuse dural ST depression.  No ST elevation.  PVCs-multifocal.  Tachycardia, sudden onset hypoxemia.  Hypoxemia out of proportion to pulmonary exam.  Would have to consider PE.  May be CHF, pneumonia.  EKG changes ACS will have to be ruled out.  No new murmur.  No rhythm change.  Hypertensive but not markedly so to suggest malignant hypertension.  On  Lasix.  Was given nebulized albuterol upon arrival.  Had minimal relief from this.  Was given his p.o. evening blood pressure medications.  He typically takes them at night.  Nitroglycerin drip started for blood pressure.  Will treat for CHF his chest x-ray does suggest CHF.  Elevated d-dimer.  Plan CT rule out PE.  Diuresis, blood pressure control.  Probable admission for CHF exacerbation.  2100: CTA negative for PE.  Blood pressure on 5 mics nitro plus p.o. meds down to 196 systolic.  Has urinated over 1 L.  Feels "much better".  Requiring 2 L nasal cannula.  Repeat EKG shows normalization of ST segments.  Will need admission for diuresis, rule out, ultimate cardiac evaluation.  CRITICAL CARE Performed by: Lolita Patella   Total critical care time: 30 minutes  Critical care time was exclusive of separately billable procedures and treating other patients.  Critical care was necessary to treat or prevent imminent or life-threatening deterioration.  Critical care was time spent personally by me on the following activities: development of treatment plan with patient and/or surrogate as well as nursing, discussions with consultants, evaluation of patient's response to treatment, examination of patient, obtaining history from patient or surrogate, ordering and performing treatments and interventions, ordering and review of laboratory studies, ordering and review of radiographic studies, pulse oximetry and re-evaluation of patient's condition.  Final Clinical Impressions(s) / ED Diagnoses   Final diagnoses:  SOB (shortness of breath)  Acute congestive heart failure, unspecified heart failure type [facility])    ED Discharge Orders    None       Tanna Furry, MD 05/02/18 2009    Tanna Furry, MD 05/02/18 2107

## 2018-05-02 NOTE — ED Notes (Signed)
Pt initially had O2 sat in 60s per nurse first, Alejandro [name].  Obviously labored resp with cyanosis and mottling to torso and extremities.  Pt placed on NRB at 15l on arrival to room.  After sats improved to 100%, transitioned to [place] at 4L.  sats now 92%.

## 2018-05-02 NOTE — ED Notes (Signed)
Date and time results received: 05/02/18 7:10 PM  (use smartphrase ".now" to insert current time)  Test: Troponin Critical Value: 0.03  Name of Provider Notified: Jeneen Rinks  Orders Received? Or Actions Taken?: Orders Received - See Orders for details

## 2018-05-02 NOTE — H&P (Addendum)
History and Physical  Alejandro [name] NIO:[id number] DOB: 27-May-1941 DOA: 05/02/2018  Referring physician: Dr Jeneen Rinks, ED physician PCP: Celene Squibb, MD  Outpatient Specialists:   Patient Coming From: home  Chief Complaint: SOB  HPI: Alejandro [name] is a 77 y.o. male with a history of hypertension, diabetes, chronic kidney disease IBS patient seen for acute onset of shortness of breath started 3 days ago but acutely worse today.  Patient has had some increased dyspnea on exertion that improved with rest.  Earlier today, the patient was walking back to the truck with his grandson when he became very short of breath where he felt exhausted and drowsy.  He was brought to the emergency department for evaluation.  He denies increased orthopnea, wheezing, fevers.  He did get seen earlier today at urgent care and was given a prescription for Zithromax, which he started.  He did not get his dose of steroids there.  History of 3 tick bites or other this week.  Emergency Department Course: Labs show BMP to 632 and minimally elevated troponin at 0.03.  Creatinine 1.15.  D-dimer was also elevated 1.13, however CT angios showed pleural effusion with pulmonary edema negative for PE.  Patient got 80 mg of Lasix IV and has diuresed 1.8 L.  He was also started on nitro drip for elevated blood pressure   Review of Systems:   Pt denies any fevers, chills, nausea, vomiting, diarrhea, constipation, abdominal pain, palpitations, headache, vision changes, lightheadedness, dizziness, melena, rectal bleeding.  Review of systems are otherwise negative  Past Medical History:  Diagnosis Date  . Chronic kidney disease 2004   kidney stones  . Diabetes mellitus   . High cholesterol   . Hypertension   . Hypertension   . IBS (irritable colon syndrome)   . Reflux    Past Surgical History:  Procedure Laterality Date  . ESOPHAGOGASTRODUODENOSCOPY  02/04/2012   Procedure: ESOPHAGOGASTRODUODENOSCOPY (EGD);  Surgeon:  Rogene Houston, MD;  Location: [facility];  Service: Endoscopy;  Laterality: N/A;  100  . nasal fx     Social History:  reports that he has never smoked. He has never used smokeless tobacco. He reports that he does not drink alcohol or use drugs. Patient lives at home  No Known Allergies  Family History  Problem Relation Age of Onset  . Heart disease Unknown   . Arthritis Unknown   . Asthma Unknown   . Diabetes Unknown       Prior to Admission medications   Medication Sig Start Date End Date Taking? Authorizing Provider  ALPRAZolam Duanne Moron) 0.5 MG tablet Take 0.5 mg by mouth at bedtime as needed for sleep.   Yes [provider]  aspirin EC 81 MG tablet Take 81 mg by mouth daily.   Yes [provider]  azithromycin (ZITHROMAX) 250 MG tablet Take 250 mg by mouth daily. Take 2 tablets on 1st day and 1 tablet daily until finished   Yes [provider]  cloNIDine (CATAPRES) 0.1 MG tablet Take 0.1 mg by mouth 2 (two) times daily.    Yes [provider]  DEXILANT 60 MG capsule TAKE ONE CAPSULE BY MOUTH EVERY DAY. 12/25/14  Yes Rehman, Mechele Dawley, MD  hydrALAZINE (APRESOLINE) 25 MG tablet Take 25 mg by mouth daily.   Yes [provider]  nebivolol (BYSTOLIC) 5 MG tablet Take 5 mg by mouth daily.    Yes [provider]  sitaGLIPtan-metformin (JANUMET) 50-1000 MG per tablet Take  1 tablet by mouth 2 (two) times daily with a meal.     Yes [provider]    Physical Exam: BP 135/80   Pulse (!) 101   Resp (!) 23   Ht 5\' 11"  (1.803 m)   Wt 120.7 kg (266 lb)   SpO2 95%   BMI 37.10 kg/m   . General: Elderly Caucasian male. Awake and alert and oriented x3. No acute cardiopulmonary distress.  Marland Kitchen HEENT: Normocephalic atraumatic.  Right and left ears normal in appearance.  Pupils equal, round, reactive to light. Extraocular muscles are intact. Sclerae anicteric and noninjected.  Moist mucosal membranes. No mucosal lesions.  . Neck:  Neck supple without lymphadenopathy. No carotid bruits. No masses palpated.  . Cardiovascular: Regular rate with normal S1-S2 sounds. No murmurs, rubs, gallops auscultated.  Increased JVD to 8 cm.  Marland Kitchen Respiratory: Good respiratory effort without wheezes.  Mild rales in the bases bilaterally.  No accessory muscle use. . Abdomen: Soft, nontender, nondistended. Active bowel sounds. No masses or hepatosplenomegaly  . Skin: No rashes, lesions, or ulcerations.  Dry, warm to touch. 2+ dorsalis pedis and radial pulses. . Musculoskeletal: No calf or leg pain. All major joints not erythematous nontender.  No upper or lower joint deformation.  Good ROM.  No contractures  . Psychiatric: Intact judgment and insight. Pleasant and cooperative. . Neurologic: No focal neurological deficits. Strength is 5/5 and symmetric in upper and lower extremities.  Cranial nerves II through XII are grossly intact.           Labs on Admission: I have personally reviewed following labs and imaging studies  CBC: Recent Labs  Lab 05/02/18 1825  WBC 19.6*  NEUTROABS 9.2*  HGB 14.3  HCT 44.8  MCV 92.4  PLT 701   Basic Metabolic Panel: Recent Labs  Lab 05/02/18 1825  NA 138  K 3.3*  CL 103  CO2 16*  GLUCOSE 297*  BUN 20  CREATININE 1.51*  CALCIUM 8.6*   GFR: Estimated Creatinine Clearance: 55.9 mL/min (A) (by C-G formula based on SCr of 1.51 mg/dL (H)). Liver Function Tests: Recent Labs  Lab 05/02/18 1825  AST 25  ALT 16*  ALKPHOS 62  BILITOT 0.6  PROT 7.3  ALBUMIN 3.8   No results for input(s): LIPASE, AMYLASE in the last 168 hours. No results for input(s): AMMONIA in the last 168 hours. Coagulation Profile: No results for input(s): INR, PROTIME in the last 168 hours. Cardiac Enzymes: Recent Labs  Lab 05/02/18 1825  TROPONINI 0.03*   BNP (last 3 results) No results for input(s): PROBNP in the last 8760 hours. HbA1C: No results for input(s): HGBA1C in the last 72 hours. CBG: No results  for input(s): GLUCAP in the last 168 hours. Lipid Profile: No results for input(s): CHOL, HDL, LDLCALC, TRIG, CHOLHDL, LDLDIRECT in the last 72 hours. Thyroid Function Tests: No results for input(s): TSH, T4TOTAL, FREET4, T3FREE, THYROIDAB in the last 72 hours. Anemia Panel: No results for input(s): VITAMINB12, FOLATE, FERRITIN, TIBC, IRON, RETICCTPCT in the last 72 hours. Urine analysis: No results found for: COLORURINE, APPEARANCEUR, LABSPEC, PHURINE, GLUCOSEU, HGBUR, BILIRUBINUR, KETONESUR, PROTEINUR, UROBILINOGEN, NITRITE, LEUKOCYTESUR Sepsis Labs: @LABRCNTIP (procalcitonin:4,lacticidven:4) )No results found for this or any previous visit (from the past 240 hour(s)).   Radiological Exams on Admission: Ct Angio Chest Pe W And/or Wo Contrast  Result Date: 05/02/2018 CLINICAL DATA:  Shortness of breath. PE suspected, intermediate prob, positive D-dimer EXAM: CT ANGIOGRAPHY CHEST WITH CONTRAST TECHNIQUE: Multidetector CT imaging of the  chest was performed using the standard protocol during bolus administration of intravenous contrast. Multiplanar CT image reconstructions and MIPs were obtained to evaluate the vascular anatomy. CONTRAST:  25mL ISOVUE-370 IOPAMIDOL (ISOVUE-370) INJECTION 76% COMPARISON:  Chest radiograph earlier this day. FINDINGS: Cardiovascular: Suboptimal contrast bolus for evaluation of the pulmonary arteries distal to the lobar level, no filling defects within the main or lobar pulmonary arteries. Respiratory motion further degrades evaluation. Mild cardiomegaly. There are coronary artery calcifications. Normal caliber thoracic aorta without dissection. Mild atherosclerosis and tortuosity. No pericardial effusion. Mediastinum/Nodes: Prominent mediastinal nodes at multiple stations, including 14 mm left paratracheal, 15 mm lower right paratracheal, 11 mm prevascular and 13 mm subcarinal nodes. Few prominent bilateral hilar nodes. No thyroid nodule. The esophagus is decompressed.  Lungs/Pleura: Small to moderate bilateral pleural effusions. Multifocal patchy ground-glass opacities with smooth septal thickening, likely pulmonary edema. Compressive atelectasis adjacent to pleural effusions. No evident pulmonary mass. Trachea mainstem bronchi are patent. Upper Abdomen: No acute findings. Musculoskeletal: There are no acute or suspicious osseous abnormalities. Remote right anterior rib fractures. Review of the MIP images confirms the above findings. IMPRESSION: 1. No central pulmonary embolus, evaluation distal to the lobar level is limited due to contrast bolus timing and soft tissue attenuation from habitus. 2. Small to moderate bilateral pleural effusions. Ground-glass opacities and smooth septal thickening likely pulmonary edema. Findings consistent with CHF. 3. Shoddy mediastinal adenopathy is likely secondary to CHF. 4. Coronary artery calcifications. Aortic Atherosclerosis (ICD10-I70.0). Electronically Signed   By: Jeb Levering M.D.   On: 05/02/2018 20:59   Dg Chest Portable 1 View  Result Date: 05/02/2018 CLINICAL DATA:  Shortness of breath EXAM: PORTABLE CHEST 1 VIEW COMPARISON:  05/18/2014 FINDINGS: Cardiac shadow is enlarged. Vascular congestion is noted consistent with mild CHF. No significant edema is seen. Old rib fractures are noted on the right with healing. No other focal abnormality is seen. IMPRESSION: Changes of CHF. Electronically Signed   By: Inez Catalina M.D.   On: 05/02/2018 18:39    EKG: Independently reviewed.  Sinus tachycardia.  Ischemic changes in the lateral leads.  This was improved on second EKG after patient began diuresing.  Assessment/Plan: Principal Problem:   Acute systolic heart failure (HCC) Active Problems:   Hypertension   GERD (gastroesophageal reflux disease)   Diabetic neuropathy with neurologic complication (HCC)   Leukocytosis   Flash pulmonary edema ([place])    This patient was discussed with the ED physician, including pertinent  vitals, physical exam findings, labs, and imaging.  We also discussed care given by the ED provider.  1. Acute systolic heart failure Telemetry monitoring Strict I/O Daily Weights Diuresis: Lasix 40 mg twice daily Potassium: 20 mEq twice a day by mouth Echo cardiac exam tomorrow Repeat BMP tomorrow 2. Flash pulmonary edema a. Improved 3. Leukocytosis a. Question etiology - ? stress response b. No obvious signs of infection.  No fevers currently.  I do not feel that the patient needs blood cultures. c. Will repeat CBC tomorrow.  If patient spikes a fever or becomes unstable, will start antibiotics d. Patient did have Lyme titers done at PCPs office.  We may need to call his PCPs office for results 4. Diabetes a. Hold metformin b. Sliding scale insulin c. CBGs before meals and nightly 5. GERD a. Continue PPI  DVT prophylaxis: Lovenox Consultants: None Code Status: Full code Family Communication: Grandson present during interview and exam Disposition Plan: Patient should be able to return home following improvement in work-up  Truett Mainland, DO [facility] Pager [phone]  If 7PM-7AM, please contact night-coverage [email or web address] Password [email or web address]

## 2018-05-02 NOTE — ED Notes (Signed)
Pt's color and O2 sat much improved.  Pt continues to be diaphoretic.

## 2018-05-03 ENCOUNTER — Encounter (HOSPITAL_COMMUNITY): Payer: Self-pay | Admitting: Cardiology

## 2018-05-03 ENCOUNTER — Inpatient Hospital Stay (HOSPITAL_COMMUNITY): Payer: Medicare Other

## 2018-05-03 DIAGNOSIS — E1165 Type 2 diabetes mellitus with hyperglycemia: Secondary | ICD-10-CM

## 2018-05-03 DIAGNOSIS — I350 Nonrheumatic aortic (valve) stenosis: Secondary | ICD-10-CM

## 2018-05-03 DIAGNOSIS — I509 Heart failure, unspecified: Secondary | ICD-10-CM

## 2018-05-03 DIAGNOSIS — E1140 Type 2 diabetes mellitus with diabetic neuropathy, unspecified: Secondary | ICD-10-CM

## 2018-05-03 DIAGNOSIS — I214 Non-ST elevation (NSTEMI) myocardial infarction: Principal | ICD-10-CM

## 2018-05-03 DIAGNOSIS — E1149 Type 2 diabetes mellitus with other diabetic neurological complication: Secondary | ICD-10-CM

## 2018-05-03 DIAGNOSIS — I10 Essential (primary) hypertension: Secondary | ICD-10-CM

## 2018-05-03 DIAGNOSIS — K219 Gastro-esophageal reflux disease without esophagitis: Secondary | ICD-10-CM

## 2018-05-03 DIAGNOSIS — A770 Spotted fever due to Rickettsia rickettsii: Secondary | ICD-10-CM

## 2018-05-03 DIAGNOSIS — D72829 Elevated white blood cell count, unspecified: Secondary | ICD-10-CM

## 2018-05-03 DIAGNOSIS — I340 Nonrheumatic mitral (valve) insufficiency: Secondary | ICD-10-CM

## 2018-05-03 DIAGNOSIS — I5033 Acute on chronic diastolic (congestive) heart failure: Secondary | ICD-10-CM

## 2018-05-03 DIAGNOSIS — N289 Disorder of kidney and ureter, unspecified: Secondary | ICD-10-CM

## 2018-05-03 DIAGNOSIS — E669 Obesity, unspecified: Secondary | ICD-10-CM

## 2018-05-03 LAB — CBC WITH DIFFERENTIAL/PLATELET
Basophils Absolute: 0 10*3/uL (ref 0.0–0.1)
Basophils Relative: 0 %
Eosinophils Absolute: 0 10*3/uL (ref 0.0–0.7)
Eosinophils Relative: 0 %
HEMATOCRIT: 41.8 % (ref 39.0–52.0)
HEMOGLOBIN: 13.5 g/dL (ref 13.0–17.0)
LYMPHS ABS: 0.9 10*3/uL (ref 0.7–4.0)
LYMPHS PCT: 8 %
MCH: 28.8 pg (ref 26.0–34.0)
MCHC: 32.3 g/dL (ref 30.0–36.0)
MCV: 89.1 fL (ref 78.0–100.0)
Monocytes Absolute: 0.3 10*3/uL (ref 0.1–1.0)
Monocytes Relative: 3 %
NEUTROS ABS: 9.2 10*3/uL — AB (ref 1.7–7.7)
NEUTROS PCT: 89 %
Platelets: 263 10*3/uL (ref 150–400)
RBC: 4.69 MIL/uL (ref 4.22–5.81)
RDW: 16.1 % — ABNORMAL HIGH (ref 11.5–15.5)
WBC: 10.4 10*3/uL (ref 4.0–10.5)

## 2018-05-03 LAB — BASIC METABOLIC PANEL
Anion gap: 14 (ref 5–15)
BUN: 23 mg/dL — AB (ref 6–20)
CHLORIDE: 105 mmol/L (ref 101–111)
CO2: 23 mmol/L (ref 22–32)
CREATININE: 1.32 mg/dL — AB (ref 0.61–1.24)
Calcium: 8.9 mg/dL (ref 8.9–10.3)
GFR calc Af Amer: 59 mL/min — ABNORMAL LOW (ref >60)
GFR calc non Af Amer: 51 mL/min — ABNORMAL LOW (ref >60)
GLUCOSE: 219 mg/dL — AB (ref 65–99)
Potassium: 4.3 mmol/L (ref 3.5–5.1)
SODIUM: 142 mmol/L (ref 135–145)

## 2018-05-03 LAB — GLUCOSE, CAPILLARY
GLUCOSE-CAPILLARY: 197 mg/dL — AB (ref 65–99)
GLUCOSE-CAPILLARY: 131 mg/dL — AB (ref 65–99)
GLUCOSE-CAPILLARY: 234 mg/dL — AB (ref 65–99)
GLUCOSE-CAPILLARY: 123 mg/dL — AB (ref 65–99)
Glucose-Capillary: 147 mg/dL — ABNORMAL HIGH (ref 65–99)

## 2018-05-03 LAB — ECHOCARDIOGRAM COMPLETE
Height: 71 in
Weight: 4063.52 oz

## 2018-05-03 LAB — TROPONIN I
Troponin I: 1.04 ng/mL (ref <0.03)
Troponin I: 1.19 ng/mL (ref <0.03)

## 2018-05-03 LAB — HEMOGLOBIN A1C
Hgb A1c MFr Bld: 6 % — ABNORMAL HIGH (ref 4.8–5.6)
Mean Plasma Glucose: 125.5 mg/dL

## 2018-05-03 LAB — MRSA PCR SCREENING: MRSA BY PCR: NEGATIVE

## 2018-05-03 MED ORDER — SODIUM CHLORIDE 0.9% FLUSH: 3.0000 mL | INTRAVENOUS | Status: DC | PRN

## 2018-05-03 MED ORDER — ISOSORB DINITRATE-HYDRALAZINE 20-37.5 MG PO TABS
1.0000 | ORAL_TABLET | Freq: Two times a day (BID) | ORAL | Status: DC
  Administered 2018-05-03 – 2018-05-10 (×16): 1 via ORAL
  Filled 2018-05-03 (×20): qty 1

## 2018-05-03 MED ORDER — NITROGLYCERIN IN D5W 200-5 MCG/ML-% IV SOLN
0.0000 ug/min | INTRAVENOUS | Status: DC
Start: 2018-05-03 — End: 2018-05-03

## 2018-05-03 MED ORDER — DOXYCYCLINE HYCLATE 100 MG PO TABS
100.0000 mg | ORAL_TABLET | Freq: Two times a day (BID) | ORAL | Status: DC
  Administered 2018-05-03 – 2018-05-07 (×9): 100 mg via ORAL
  Filled 2018-05-03 (×9): qty 1

## 2018-05-03 MED ORDER — SODIUM CHLORIDE 0.9 % IV SOLN
INTRAVENOUS | Status: DC
  Administered 2018-05-04: 06:00:00 via INTRAVENOUS

## 2018-05-03 MED ORDER — ASPIRIN 81 MG PO CHEW
81.0000 mg | CHEWABLE_TABLET | ORAL | Status: AC
  Administered 2018-05-04: 81 mg via ORAL
  Filled 2018-05-03: qty 1

## 2018-05-03 MED ORDER — ATORVASTATIN CALCIUM 80 MG PO TABS
80.0000 mg | ORAL_TABLET | Freq: Every day | ORAL | Status: DC
  Administered 2018-05-03 – 2018-05-10 (×8): 80 mg via ORAL
  Filled 2018-05-03 (×5): qty 1
  Filled 2018-05-03: qty 2
  Filled 2018-05-03 (×2): qty 1

## 2018-05-03 MED ORDER — SODIUM CHLORIDE 0.9% FLUSH: 3.0000 mL | Freq: Two times a day (BID) | INTRAVENOUS | Status: DC

## 2018-05-03 MED ORDER — SODIUM CHLORIDE 0.9 % IV SOLN: 250.0000 mL | INTRAVENOUS | Status: DC | PRN

## 2018-05-03 NOTE — Progress Notes (Signed)
CRITICAL VALUE ALERT  Critical Value:  Troponin 1.04  Date & Time Notied:  05/03/2018 0700  Provider Notified: [place]  Orders Received/Actions taken: No new orders at this time. Will continue to monitor

## 2018-05-03 NOTE — Progress Notes (Signed)
*  PRELIMINARY RESULTS* Echocardiogram 2D Echocardiogram has been performed.  Alejandro [name] 05/03/2018, 9:48 AM

## 2018-05-03 NOTE — Progress Notes (Signed)
At 1600: Nurse to nurse report called and given to [facility] at [facility] on [address]. Care link was called and made aware of transportation needed and patient is awaiting transport. Care link was called at 1800 to verify that they still had patient on list to be transported to [facility] to which writer was told he was next on their list. Patient and family aware of room ready at [facility] and updated on transport. Patient currently has no complaints of pain, shortness of breath, chest pain, dizziness, nausea or vomiting.

## 2018-05-03 NOTE — Consult Note (Signed)
Cardiology Consultation:   Patient ID: Alejandro [name]; [id number]; 1941/02/08   Admit date: 05/02/2018 Date of Consult: 05/03/2018  Primary Care Provider: Celene Squibb, MD Consulting Cardiologist: Dr. Satira Sark   Patient Profile:   Alejandro [name] is a 77 y.o. male with a history of hypertension, hyperlipidemia, type 2 diabetes mellitus, and family history of premature CAD in first-degree male relatives who is being seen today for the evaluation of recent onset shortness of breath and abnormal troponin levels at the request of Dr. Dyann Kief.  History of Present Illness:   Mr. [name] presents to the hospital with 3-day history of recent onset dyspnea on exertion culminating and more severe shortness of breath at rest, diaphoresis, and fatigue.  Reports no other recent major change in health, did have some recent tick bites.  No fever, cough, orthopnea or PND.  On evaluation in the hospital he was found to have bilateral pleural effusions with evidence of pulmonary edema as well, climbing troponin I now up to 1.04.  ECG shows sinus rhythm with nonspecific ST-T wave abnormalities, possibly ischemic although no old tracings are available for comparison.  D-dimer also elevated but chest CTA negative for pulmonary embolus.  BNP 632.  He follows with Dr. Nevada Crane for PCP.  Reports compliance with his medications.  Outpatient regimen includes aspirin, Dexilant, hydralazine, Bystolic, clonidine, and Janumet.  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  Reports no other family members currently living with history of premature CAD in his father and brothers.  Past Medical History:  Diagnosis Date  . History of nephrolithiasis 2004  . Hyperlipidemia   . Hypertension   . IBS (irritable colon syndrome)   . Reflux   . Type 2 diabetes mellitus ([place])     Past Surgical History:  Procedure Laterality Date  . ESOPHAGOGASTRODUODENOSCOPY  02/04/2012   Procedure: ESOPHAGOGASTRODUODENOSCOPY (EGD);   Surgeon: Rogene Houston, MD;  Location: [facility];  Service: Endoscopy;  Laterality: N/A;  100  . NASAL FRACTURE SURGERY       Inpatient Medications: Scheduled Meds: . albuterol  5 mg Nebulization Once  . aspirin EC  81 mg Oral Daily  . cloNIDine  0.1 mg Oral BID  . enoxaparin (LOVENOX) injection  40 mg Subcutaneous Q24H  . furosemide  40 mg Oral BID  . hydrALAZINE  25 mg Oral Daily  . insulin aspart  0-15 Units Subcutaneous TID WC  . insulin aspart  0-5 Units Subcutaneous QHS  . nebivolol  5 mg Oral Daily  . pantoprazole  40 mg Oral Daily  . potassium chloride  20 mEq Oral BID  . sodium chloride flush  3 mL Intravenous Q12H   Continuous Infusions: . sodium chloride    . albuterol 10 mg/hr (05/02/18 1837)  . nitroGLYCERIN 20 mcg/min (05/03/18 0104)   PRN Meds: sodium chloride, acetaminophen, ALPRAZolam, ondansetron (ZOFRAN) IV, sodium chloride flush  Allergies:   No Known Allergies  Social History:   Social History   Socioeconomic History  . Marital status: Married    Spouse name: Not on file  . Number of children: Not on file  . Years of education: Not on file  . Highest education level: Not on file  Occupational History  . Not on file  Social Needs  . Financial resource strain: Not on file  . Food insecurity:    Worry: Not on file    Inability: Not on file  . Transportation needs:    Medical: Not  on file    Non-medical: Not on file  Tobacco Use  . Smoking status: Never Smoker  . Smokeless tobacco: Never Used  Substance and Sexual Activity  . Alcohol use: No    Alcohol/week: 0.0 oz  . Drug use: No  . Sexual activity: Yes  Lifestyle  . Physical activity:    Days per week: Not on file    Minutes per session: Not on file  . Stress: Not on file  Relationships  . Social connections:    Talks on phone: Not on file    Gets together: Not on file    Attends religious service: Not on file    Active member of club or organization: Not on file    Attends  meetings of clubs or organizations: Not on file    Relationship status: Not on file  . Intimate partner violence:    Fear of current or ex partner: Not on file    Emotionally abused: Not on file    Physically abused: Not on file    Forced sexual activity: Not on file  Other Topics Concern  . Not on file  Social History Narrative  . Not on file    Family History:   The patient's family history includes Arthritis in his unknown relative; Asthma in his unknown relative; Diabetes in his unknown relative; Diabetes Mellitus II in his mother; Heart attack in his brother and father; Heart disease in his unknown relative.  ROS:  Please see the history of present illness.  Hearing loss, reflux.  All other ROS reviewed and negative.     Physical Exam/Data:   Vitals:   05/03/18 0545 05/03/18 0700 05/03/18 0757 05/03/18 0810  BP: (!) 111/50 113/62  (!) 142/76  Pulse: 75 70    Resp: (!) 21 18  18   Temp:   98 F (36.7 C)   TempSrc:      SpO2: 92% 93%    Weight:      Height:        Intake/Output Summary (Last 24 hours) at 05/03/2018 0904 Last data filed at 05/03/2018 0815 Gross per 24 hour  Intake 247.13 ml  Output 3550 ml  Net -3302.87 ml   Filed Weights   05/02/18 1813 05/02/18 2239 05/03/18 0500  Weight: 266 lb (120.7 kg) 253 lb 15.5 oz (115.2 kg) 253 lb 15.5 oz (115.2 kg)   Body mass index is 35.42 kg/m.   Gen: Obese male, no acute distress. HEENT: Conjunctiva and lids normal, oropharynx clear. Neck: Supple, no elevated JVP or carotid bruits, no thyromegaly. Lungs: Decreased breath sounds at the bases with few scattered crackles, no wheezing, nonlabored breathing at rest. Cardiac: Regular rate and rhythm, no S3, 3/6 systolic murmur in aortic position, no pericardial rub. Abdomen: Soft, nontender, bowel sounds present, no guarding or rebound. Extremities: No pitting edema, distal pulses 2+. Skin: Warm and dry. Musculoskeletal: No kyphosis. Neuropsychiatric: Alert and oriented  x3, affect grossly appropriate.  EKG:  I personally reviewed the tracing from 05/02/2018 which showed sinus tachycardia with nonspecific ST-T changes.  Telemetry:  I personally reviewed telemetry which shows sinus rhythm, brief NSVT, 3-5 beats.  Relevant CV Studies:  Echocardiogram 01/30/2015: Study Conclusions  - Left ventricle: The cavity size was normal. There was moderate concentric hypertrophy. Systolic function was low normal. The estimated ejection fraction was approximately 50%. Regional wall motion abnormalities cannot be excluded. Doppler parameters are consistent with abnormal left ventricular relaxation (grade 1 diastolic dysfunction). Doppler parameters are consistent with  high ventricular filling pressure. - Aortic valve: Mildly calcified annulus. Trileaflet; mildly thickened, mildly calcified leaflets. Mild stenosis. Peak velocity 2.08 m/s. Mean gradient (S): 10 mm Hg. - Aorta: Mild dilatation at the level of the sinotubular junction. Maximal diameter 3.8 cm. - Mitral valve: Mildly thickened leaflets . There was trivial regurgitation. - Left atrium: The atrium was mildly dilated. Volume/bsa, ES, (1-plane Simpson&'s, A2C): 34.2 ml/m^2.  Laboratory Data:  Chemistry Recent Labs  Lab 05/02/18 1825 05/03/18 0444  NA 138 142  K 3.3* 4.3  CL 103 105  CO2 16* 23  GLUCOSE 297* 219*  BUN 20 23*  CREATININE 1.51* 1.32*  CALCIUM 8.6* 8.9  GFRNONAA 43* 51*  GFRAA 50* 59*  ANIONGAP 19* 14    Recent Labs  Lab 05/02/18 1825  PROT 7.3  ALBUMIN 3.8  AST 25  ALT 16*  ALKPHOS 62  BILITOT 0.6   Hematology Recent Labs  Lab 05/02/18 1825 05/03/18 0444  WBC 19.6* 10.4  RBC 4.85 4.69  HGB 14.3 13.5  HCT 44.8 41.8  MCV 92.4 89.1  MCH 29.5 28.8  MCHC 31.9 32.3  RDW 16.0* 16.1*  PLT 325 263   Cardiac Enzymes Recent Labs  Lab 05/02/18 1825 05/02/18 2302 05/03/18 0444  TROPONINI 0.03* 0.41* 1.04*   No results for input(s): TROPIPOC in  the last 168 hours.  BNP Recent Labs  Lab 05/02/18 1825  BNP 632.0*    DDimer  Recent Labs  Lab 05/02/18 1825  DDIMER 1.13*    Radiology/Studies:  Ct Angio Chest Pe W And/or Wo Contrast  Result Date: 05/02/2018 CLINICAL DATA:  Shortness of breath. PE suspected, intermediate prob, positive D-dimer EXAM: CT ANGIOGRAPHY CHEST WITH CONTRAST TECHNIQUE: Multidetector CT imaging of the chest was performed using the standard protocol during bolus administration of intravenous contrast. Multiplanar CT image reconstructions and MIPs were obtained to evaluate the vascular anatomy. CONTRAST:  45mL ISOVUE-370 IOPAMIDOL (ISOVUE-370) INJECTION 76% COMPARISON:  Chest radiograph earlier this day. FINDINGS: Cardiovascular: Suboptimal contrast bolus for evaluation of the pulmonary arteries distal to the lobar level, no filling defects within the main or lobar pulmonary arteries. Respiratory motion further degrades evaluation. Mild cardiomegaly. There are coronary artery calcifications. Normal caliber thoracic aorta without dissection. Mild atherosclerosis and tortuosity. No pericardial effusion. Mediastinum/Nodes: Prominent mediastinal nodes at multiple stations, including 14 mm left paratracheal, 15 mm lower right paratracheal, 11 mm prevascular and 13 mm subcarinal nodes. Few prominent bilateral hilar nodes. No thyroid nodule. The esophagus is decompressed. Lungs/Pleura: Small to moderate bilateral pleural effusions. Multifocal patchy ground-glass opacities with smooth septal thickening, likely pulmonary edema. Compressive atelectasis adjacent to pleural effusions. No evident pulmonary mass. Trachea mainstem bronchi are patent. Upper Abdomen: No acute findings. Musculoskeletal: There are no acute or suspicious osseous abnormalities. Remote right anterior rib fractures. Review of the MIP images confirms the above findings. IMPRESSION: 1. No central pulmonary embolus, evaluation distal to the lobar level is limited due  to contrast bolus timing and soft tissue attenuation from habitus. 2. Small to moderate bilateral pleural effusions. Ground-glass opacities and smooth septal thickening likely pulmonary edema. Findings consistent with CHF. 3. Shoddy mediastinal adenopathy is likely secondary to CHF. 4. Coronary artery calcifications. Aortic Atherosclerosis (ICD10-I70.0). Electronically Signed   By: Jeb Levering M.D.   On: 05/02/2018 20:59   Dg Chest Portable 1 View  Result Date: 05/02/2018 CLINICAL DATA:  Shortness of breath EXAM: PORTABLE CHEST 1 VIEW COMPARISON:  05/18/2014 FINDINGS: Cardiac shadow is enlarged. Vascular congestion is noted consistent with  mild CHF. No significant edema is seen. Old rib fractures are noted on the right with healing. No other focal abnormality is seen. IMPRESSION: Changes of CHF. Electronically Signed   By: Inez Catalina M.D.   On: 05/02/2018 18:39    Assessment and Plan:   1. NSTEMI, patient presenting with worsening shortness of breath and congestive heart failure, systolic versus diastolic not yet determined.  Troponin I up to 1.04 and ECG with nonspecific ST-T wave abnormalities.  He has no prior documented history of ischemic heart disease but multiple cardiac risk factors including age and gender, hypertension, type 2 diabetes mellitus, hyperlipidemia, and family history of premature CAD in first-degree male relatives.  2.  Mild aortic stenosis as of 2016 echocardiogram, cardiac murmur suggests progression.  Follow-up echocardiogram is pending.  3.  Renal insufficiency, acute versus chronic not yet determined, do not have access to outpatient lab work at this time.  Creatinine 1.5 down to 1.3.  4.  Type 2 diabetes mellitus on Janumet as an outpatient, held at this time.  Random glucose 200-300 range.  Currently on insulin per primary team.  5.  Reported history of hyperlipidemia.  Not on statin as outpatient.  6.  Essential hypertension, on hydralazine, Bystolic, and  clonidine as an outpatient.  Situation discussed with the patient.  Recommendation is for him to be transferred to our cardiology service at [facility] in anticipation of a diagnostic cardiac catheterization.  He needs to have an echocardiogram first to quantify LVEF and degree of aortic stenosis.  Continue aspirin and Lovenox as well as Lasix with potassium supplements.  Depending on further quantification of LVEF and coronary anatomy, would anticipate changes in his medical regimen in terms of beta-blocker type and also potentially addition of ARB (after angiography) instead of hydralazine and clonidine. Starting empiric statin at this time, check FLP.  Signed, Rozann Lesches, MD  05/03/2018 9:04 AM

## 2018-05-03 NOTE — Progress Notes (Signed)
[facility] arrived at 2015 to transport patient to [facility]. Report given to [facility] prior to arrival. Patient taken with all belongings with no complaints.

## 2018-05-03 NOTE — H&P (View-Only) (Signed)
Cardiology Consultation:   Patient ID: Alejandro [name]; [id number]; 1941/01/20   Admit date: 05/02/2018 Date of Consult: 05/03/2018  Primary Care Provider: Celene Squibb, MD Consulting Cardiologist: Dr. Satira Sark   Patient Profile:   [name] is a 77 y.o. male with a history of hypertension, hyperlipidemia, type 2 diabetes mellitus, and family history of premature CAD in first-degree male relatives who is being seen today for the evaluation of recent onset shortness of breath and abnormal troponin levels at the request of Dr. Dyann Kief.  History of Present Illness:   [name] presents to the hospital with 3-day history of recent onset dyspnea on exertion culminating and more severe shortness of breath at rest, diaphoresis, and fatigue.  Reports no other recent major change in health, did have some recent tick bites.  No fever, cough, orthopnea or PND.  On evaluation in the hospital he was found to have bilateral pleural effusions with evidence of pulmonary edema as well, climbing troponin I now up to 1.04.  ECG shows sinus rhythm with nonspecific ST-T wave abnormalities, possibly ischemic although no old tracings are available for comparison.  D-dimer also elevated but chest CTA negative for pulmonary embolus.  BNP 632.  He follows with Dr. Nevada Crane for PCP.  Reports compliance with his medications.  Outpatient regimen includes aspirin, Dexilant, hydralazine, Bystolic, clonidine, and Janumet.  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  Reports no other family members currently living with history of premature CAD in his father and brothers.  Past Medical History:  Diagnosis Date  . History of nephrolithiasis 2004  . Hyperlipidemia   . Hypertension   . IBS (irritable colon syndrome)   . Reflux   . Type 2 diabetes mellitus ([place])     Past Surgical History:  Procedure Laterality Date  . ESOPHAGOGASTRODUODENOSCOPY  02/04/2012   Procedure: ESOPHAGOGASTRODUODENOSCOPY (EGD);   Surgeon: Rogene Houston, MD;  Location: [facility];  Service: Endoscopy;  Laterality: N/A;  100  . NASAL FRACTURE SURGERY       Inpatient Medications: Scheduled Meds: . albuterol  5 mg Nebulization Once  . aspirin EC  81 mg Oral Daily  . cloNIDine  0.1 mg Oral BID  . enoxaparin (LOVENOX) injection  40 mg Subcutaneous Q24H  . furosemide  40 mg Oral BID  . hydrALAZINE  25 mg Oral Daily  . insulin aspart  0-15 Units Subcutaneous TID WC  . insulin aspart  0-5 Units Subcutaneous QHS  . nebivolol  5 mg Oral Daily  . pantoprazole  40 mg Oral Daily  . potassium chloride  20 mEq Oral BID  . sodium chloride flush  3 mL Intravenous Q12H   Continuous Infusions: . sodium chloride    . albuterol 10 mg/hr (05/02/18 1837)  . nitroGLYCERIN 20 mcg/min (05/03/18 0104)   PRN Meds: sodium chloride, acetaminophen, ALPRAZolam, ondansetron (ZOFRAN) IV, sodium chloride flush  Allergies:   No Known Allergies  Social History:   Social History   Socioeconomic History  . Marital status: Married    Spouse name: Not on file  . Number of children: Not on file  . Years of education: Not on file  . Highest education level: Not on file  Occupational History  . Not on file  Social Needs  . Financial resource strain: Not on file  . Food insecurity:    Worry: Not on file    Inability: Not on file  . Transportation needs:    Medical: Not  on file    Non-medical: Not on file  Tobacco Use  . Smoking status: Never Smoker  . Smokeless tobacco: Never Used  Substance and Sexual Activity  . Alcohol use: No    Alcohol/week: 0.0 oz  . Drug use: No  . Sexual activity: Yes  Lifestyle  . Physical activity:    Days per week: Not on file    Minutes per session: Not on file  . Stress: Not on file  Relationships  . Social connections:    Talks on phone: Not on file    Gets together: Not on file    Attends religious service: Not on file    Active member of club or organization: Not on file    Attends  meetings of clubs or organizations: Not on file    Relationship status: Not on file  . Intimate partner violence:    Fear of current or ex partner: Not on file    Emotionally abused: Not on file    Physically abused: Not on file    Forced sexual activity: Not on file  Other Topics Concern  . Not on file  Social History Narrative  . Not on file    Family History:   The patient's family history includes Arthritis in his unknown relative; Asthma in his unknown relative; Diabetes in his unknown relative; Diabetes Mellitus II in his mother; Heart attack in his brother and father; Heart disease in his unknown relative.  ROS:  Please see the history of present illness.  Hearing loss, reflux.  All other ROS reviewed and negative.     Physical Exam/Data:   Vitals:   05/03/18 0545 05/03/18 0700 05/03/18 0757 05/03/18 0810  BP: (!) 111/50 113/62  (!) 142/76  Pulse: 75 70    Resp: (!) 21 18  18   Temp:   98 F (36.7 C)   TempSrc:      SpO2: 92% 93%    Weight:      Height:        Intake/Output Summary (Last 24 hours) at 05/03/2018 0904 Last data filed at 05/03/2018 0815 Gross per 24 hour  Intake 247.13 ml  Output 3550 ml  Net -3302.87 ml   Filed Weights   05/02/18 1813 05/02/18 2239 05/03/18 0500  Weight: 266 lb (120.7 kg) 253 lb 15.5 oz (115.2 kg) 253 lb 15.5 oz (115.2 kg)   Body mass index is 35.42 kg/m.   Gen: Obese male, no acute distress. HEENT: Conjunctiva and lids normal, oropharynx clear. Neck: Supple, no elevated JVP or carotid bruits, no thyromegaly. Lungs: Decreased breath sounds at the bases with few scattered crackles, no wheezing, nonlabored breathing at rest. Cardiac: Regular rate and rhythm, no S3, 3/6 systolic murmur in aortic position, no pericardial rub. Abdomen: Soft, nontender, bowel sounds present, no guarding or rebound. Extremities: No pitting edema, distal pulses 2+. Skin: Warm and dry. Musculoskeletal: No kyphosis. Neuropsychiatric: Alert and oriented  x3, affect grossly appropriate.  EKG:  I personally reviewed the tracing from 05/02/2018 which showed sinus tachycardia with nonspecific ST-T changes.  Telemetry:  I personally reviewed telemetry which shows sinus rhythm, brief NSVT, 3-5 beats.  Relevant CV Studies:  Echocardiogram 01/30/2015: Study Conclusions  - Left ventricle: The cavity size was normal. There was moderate concentric hypertrophy. Systolic function was low normal. The estimated ejection fraction was approximately 50%. Regional wall motion abnormalities cannot be excluded. Doppler parameters are consistent with abnormal left ventricular relaxation (grade 1 diastolic dysfunction). Doppler parameters are consistent with  high ventricular filling pressure. - Aortic valve: Mildly calcified annulus. Trileaflet; mildly thickened, mildly calcified leaflets. Mild stenosis. Peak velocity 2.08 m/s. Mean gradient (S): 10 mm Hg. - Aorta: Mild dilatation at the level of the sinotubular junction. Maximal diameter 3.8 cm. - Mitral valve: Mildly thickened leaflets . There was trivial regurgitation. - Left atrium: The atrium was mildly dilated. Volume/bsa, ES, (1-plane Simpson&'s, A2C): 34.2 ml/m^2.  Laboratory Data:  Chemistry Recent Labs  Lab 05/02/18 1825 05/03/18 0444  NA 138 142  K 3.3* 4.3  CL 103 105  CO2 16* 23  GLUCOSE 297* 219*  BUN 20 23*  CREATININE 1.51* 1.32*  CALCIUM 8.6* 8.9  GFRNONAA 43* 51*  GFRAA 50* 59*  ANIONGAP 19* 14    Recent Labs  Lab 05/02/18 1825  PROT 7.3  ALBUMIN 3.8  AST 25  ALT 16*  ALKPHOS 62  BILITOT 0.6   Hematology Recent Labs  Lab 05/02/18 1825 05/03/18 0444  WBC 19.6* 10.4  RBC 4.85 4.69  HGB 14.3 13.5  HCT 44.8 41.8  MCV 92.4 89.1  MCH 29.5 28.8  MCHC 31.9 32.3  RDW 16.0* 16.1*  PLT 325 263   Cardiac Enzymes Recent Labs  Lab 05/02/18 1825 05/02/18 2302 05/03/18 0444  TROPONINI 0.03* 0.41* 1.04*   No results for input(s): TROPIPOC in  the last 168 hours.  BNP Recent Labs  Lab 05/02/18 1825  BNP 632.0*    DDimer  Recent Labs  Lab 05/02/18 1825  DDIMER 1.13*    Radiology/Studies:  Ct Angio Chest Pe W And/or Wo Contrast  Result Date: 05/02/2018 CLINICAL DATA:  Shortness of breath. PE suspected, intermediate prob, positive D-dimer EXAM: CT ANGIOGRAPHY CHEST WITH CONTRAST TECHNIQUE: Multidetector CT imaging of the chest was performed using the standard protocol during bolus administration of intravenous contrast. Multiplanar CT image reconstructions and MIPs were obtained to evaluate the vascular anatomy. CONTRAST:  34mL ISOVUE-370 IOPAMIDOL (ISOVUE-370) INJECTION 76% COMPARISON:  Chest radiograph earlier this day. FINDINGS: Cardiovascular: Suboptimal contrast bolus for evaluation of the pulmonary arteries distal to the lobar level, no filling defects within the main or lobar pulmonary arteries. Respiratory motion further degrades evaluation. Mild cardiomegaly. There are coronary artery calcifications. Normal caliber thoracic aorta without dissection. Mild atherosclerosis and tortuosity. No pericardial effusion. Mediastinum/Nodes: Prominent mediastinal nodes at multiple stations, including 14 mm left paratracheal, 15 mm lower right paratracheal, 11 mm prevascular and 13 mm subcarinal nodes. Few prominent bilateral hilar nodes. No thyroid nodule. The esophagus is decompressed. Lungs/Pleura: Small to moderate bilateral pleural effusions. Multifocal patchy ground-glass opacities with smooth septal thickening, likely pulmonary edema. Compressive atelectasis adjacent to pleural effusions. No evident pulmonary mass. Trachea mainstem bronchi are patent. Upper Abdomen: No acute findings. Musculoskeletal: There are no acute or suspicious osseous abnormalities. Remote right anterior rib fractures. Review of the MIP images confirms the above findings. IMPRESSION: 1. No central pulmonary embolus, evaluation distal to the lobar level is limited due  to contrast bolus timing and soft tissue attenuation from habitus. 2. Small to moderate bilateral pleural effusions. Ground-glass opacities and smooth septal thickening likely pulmonary edema. Findings consistent with CHF. 3. Shoddy mediastinal adenopathy is likely secondary to CHF. 4. Coronary artery calcifications. Aortic Atherosclerosis (ICD10-I70.0). Electronically Signed   By: Jeb Levering M.D.   On: 05/02/2018 20:59   Dg Chest Portable 1 View  Result Date: 05/02/2018 CLINICAL DATA:  Shortness of breath EXAM: PORTABLE CHEST 1 VIEW COMPARISON:  05/18/2014 FINDINGS: Cardiac shadow is enlarged. Vascular congestion is noted consistent with  mild CHF. No significant edema is seen. Old rib fractures are noted on the right with healing. No other focal abnormality is seen. IMPRESSION: Changes of CHF. Electronically Signed   By: Inez Catalina M.D.   On: 05/02/2018 18:39    Assessment and Plan:   1. NSTEMI, patient presenting with worsening shortness of breath and congestive heart failure, systolic versus diastolic not yet determined.  Troponin I up to 1.04 and ECG with nonspecific ST-T wave abnormalities.  He has no prior documented history of ischemic heart disease but multiple cardiac risk factors including age and gender, hypertension, type 2 diabetes mellitus, hyperlipidemia, and family history of premature CAD in first-degree male relatives.  2.  Mild aortic stenosis as of 2016 echocardiogram, cardiac murmur suggests progression.  Follow-up echocardiogram is pending.  3.  Renal insufficiency, acute versus chronic not yet determined, do not have access to outpatient lab work at this time.  Creatinine 1.5 down to 1.3.  4.  Type 2 diabetes mellitus on Janumet as an outpatient, held at this time.  Random glucose 200-300 range.  Currently on insulin per primary team.  5.  Reported history of hyperlipidemia.  Not on statin as outpatient.  6.  Essential hypertension, on hydralazine, Bystolic, and  clonidine as an outpatient.  Situation discussed with the patient.  Recommendation is for him to be transferred to our cardiology service at [facility] in anticipation of a diagnostic cardiac catheterization.  He needs to have an echocardiogram first to quantify LVEF and degree of aortic stenosis.  Continue aspirin and Lovenox as well as Lasix with potassium supplements.  Depending on further quantification of LVEF and coronary anatomy, would anticipate changes in his medical regimen in terms of beta-blocker type and also potentially addition of ARB (after angiography) instead of hydralazine and clonidine. Starting empiric statin at this time, check FLP.  Signed, Rozann Lesches, MD  05/03/2018 9:04 AM

## 2018-05-03 NOTE — Progress Notes (Signed)
Critical lab result of Troponin 1.19. Dr Dyann Kief made aware via text page. Patient currently has no complaints of chest pain or discomfort or any complaints of shortness of breath or pain.

## 2018-05-03 NOTE — Progress Notes (Addendum)
Alejandro [name]  SEG:[id number] DOB: [birth year] DOA: 05/02/2018 PCP: Celene Squibb, MD   Brief Narrative:  77 year old male with past medical history significant for hypertension, hyperlipidemia, GERD and type 2 diabetes mellitus; who presented to the ED with SOB and dyspnea on exertion. Patient symptoms with concerns for angina equivalent. His troponin became elevated and rising, suggesting NSTEMI. Plan is to transfer him to [facility] for heart cath.    Assessment & Plan: 1-shortness of breath and dyspnea on exertion: Most likely in the setting of NSTEMI and acute on chronic diastolic heart failure. -Troponin has continue trending up in comparison to initial admission level -Cardiology has been consulted and is looking to have patient transferred to [facility] for heart cath -Continue beta-blocker, statins, nitrates/hydralazine -Will also continue IV Lasix -Overall symptoms of shortness of breath significantly improved with initial diuresis -2D echo with mild hypokinesis and preserved ejection fraction; grade 2 diastolic dysfunction. -No acute ischemic changes appreciated on telemetry or EKG. -continue ASA -Follow daily weights, low-sodium diet and a strict intake and output.  2-Hypertension -Initially elevated but improved with the use of nitroglycerin -Resumption of his antihypertensive regimen with the addition of BiDil has paid off on blood pressure is fairly well-controlled currently. -Follow vital signs and further adjust his medication regimen as needed -Cardiology on board and is looking to make some changes long-term ones catheterization is completed.  3-GERD (gastroesophageal reflux disease) -Continue PPI  4-class II obesity: -Low calorie diet, portion control and increase physical activity discussed with patient -Body mass index is 35.42 kg/m.  5-type 2 diabetes mellitus with neuropathy  -continue SSI and continue holding oral hypoglycemic regimen while  inpatient -will check A1C  6-Leukocytosis: -resolved on repeat CBC -most likely demargination -no signs of infection appreciated.   7-tick bite illness -tested at PCP office for RMS and results came back positive -will treat with doxycycline  -needs 14 days of treatment   DVT prophylaxis: lovenox. Code Status: Full code Family Communication: Sister-in-law at bedside Disposition Plan: After discussing with cardiology service the plan is to transfer patient to [facility] on the third service to pursued heart catheterization and provide further treatment.  Patient has a heart score of 5 and his presentation of dyspnea on exertion with elevated troponin may be an equivalent of angina with NSTEMI.  Consultants:   Cardiology service.  Procedures:   See below for x-ray reports  2D echo: With preserved ejection fraction (50-55%), mild hypokinesis of the basal-mid inferior myocardium.  Grade 2 diastolic dysfunction.    Antimicrobials:  Anti-infectives (From admission, onward)   None     Subjective: Feeling better, denying chest pain, no nausea, no vomiting, no abdominal pain.  Still slightly short of breath while laying down flat.  Objective: Vitals:   05/03/18 0700 05/03/18 0757 05/03/18 0810 05/03/18 1033  BP: 113/62  (!) 142/76 133/86  Pulse: 70     Resp: 18  18   Temp:  98 F (36.7 C)    TempSrc:      SpO2: 93%     Weight:      Height:        Intake/Output Summary (Last 24 hours) at 05/03/2018 1107 Last data filed at 05/03/2018 1035 Gross per 24 hour  Intake 250.13 ml  Output 3550 ml  Net -3299.87 ml   Filed Weights   05/02/18 1813 05/02/18 2239 05/03/18 0500  Weight: 120.7 kg (266 lb) 115.2 kg (253 lb 15.5 oz) 115.2  kg (253 lb 15.5 oz)    Examination:  General exam: Alert, awake, oriented x 3; patient currently denies chest pain and reports improvement in his breathing.  No nausea, no vomiting, no fever.  Vital signs stable. Respiratory system: Fine  crackles at the bases, positive rhonchi, no wheezing, no using accessory muscles, no tachypnea.  Cardiovascular system: Rate controlled, positive systolic murmur, no rubs, no gallops.  Mild JVD appreciated on exam. Gastrointestinal system: Obese, soft, nontender and w/o guarding. No organomegaly or masses felt. Normal bowel sounds heard. Central nervous system: Alert and oriented. No focal neurological deficits. Extremities: Trace edema bilaterally, no cyanosis, no clubbing, no erythematous changes appreciated. Skin: No rashes, lesions or ulcers Psychiatry: Judgement and insight appear normal. Mood & affect appropriate.   Data Reviewed: I have personally reviewed following labs and imaging studies  CBC: Recent Labs  Lab 05/02/18 1825 05/03/18 0444  WBC 19.6* 10.4  NEUTROABS 9.2* 9.2*  HGB 14.3 13.5  HCT 44.8 41.8  MCV 92.4 89.1  PLT 325 376   Basic Metabolic Panel: Recent Labs  Lab 05/02/18 1825 05/03/18 0444  NA 138 142  K 3.3* 4.3  CL 103 105  CO2 16* 23  GLUCOSE 297* 219*  BUN 20 23*  CREATININE 1.51* 1.32*  CALCIUM 8.6* 8.9   GFR: Estimated Creatinine Clearance: 62.4 mL/min (A) (by C-G formula based on SCr of 1.32 mg/dL (H)).   Liver Function Tests: Recent Labs  Lab 05/02/18 1825  AST 25  ALT 16*  ALKPHOS 62  BILITOT 0.6  PROT 7.3  ALBUMIN 3.8   Cardiac Enzymes: Recent Labs  Lab 05/02/18 1825 05/02/18 2302 05/03/18 0444 05/03/18 1014  TROPONINI 0.03* 0.41* 1.04* 1.19*   CBG: Recent Labs  Lab 05/02/18 2254 05/03/18 0737  GLUCAP 244* 197*    Recent Results (from the past 240 hour(s))  MRSA PCR Screening     Status: None   Collection Time: 05/02/18 10:34 PM  Result Value Ref Range Status   MRSA by PCR NEGATIVE NEGATIVE Final    Comment:        The GeneXpert MRSA Assay (FDA approved for NASAL specimens only), is one component of a comprehensive MRSA colonization surveillance program. It is not intended to diagnose MRSA infection nor to  guide or monitor treatment for MRSA infections. Performed at [facility], [address] [place], [place] [address]      Radiology Studies: Ct Angio Chest Pe W And/or Wo Contrast  Result Date: 05/02/2018 CLINICAL DATA:  Shortness of breath. PE suspected, intermediate prob, positive D-dimer EXAM: CT ANGIOGRAPHY CHEST WITH CONTRAST TECHNIQUE: Multidetector CT imaging of the chest was performed using the standard protocol during bolus administration of intravenous contrast. Multiplanar CT image reconstructions and MIPs were obtained to evaluate the vascular anatomy. CONTRAST:  38mL ISOVUE-370 IOPAMIDOL (ISOVUE-370) INJECTION 76% COMPARISON:  Chest radiograph earlier this day. FINDINGS: Cardiovascular: Suboptimal contrast bolus for evaluation of the pulmonary arteries distal to the lobar level, no filling defects within the main or lobar pulmonary arteries. Respiratory motion further degrades evaluation. Mild cardiomegaly. There are coronary artery calcifications. Normal caliber thoracic aorta without dissection. Mild atherosclerosis and tortuosity. No pericardial effusion. Mediastinum/Nodes: Prominent mediastinal nodes at multiple stations, including 14 mm left paratracheal, 15 mm lower right paratracheal, 11 mm prevascular and 13 mm subcarinal nodes. Few prominent bilateral hilar nodes. No thyroid nodule. The esophagus is decompressed. Lungs/Pleura: Small to moderate bilateral pleural effusions. Multifocal patchy ground-glass opacities with smooth septal thickening, likely pulmonary edema. Compressive atelectasis  adjacent to pleural effusions. No evident pulmonary mass. Trachea mainstem bronchi are patent. Upper Abdomen: No acute findings. Musculoskeletal: There are no acute or suspicious osseous abnormalities. Remote right anterior rib fractures. Review of the MIP images confirms the above findings. IMPRESSION: 1. No central pulmonary embolus, evaluation distal to the lobar level is limited due to contrast  bolus timing and soft tissue attenuation from habitus. 2. Small to moderate bilateral pleural effusions. Ground-glass opacities and smooth septal thickening likely pulmonary edema. Findings consistent with CHF. 3. Shoddy mediastinal adenopathy is likely secondary to CHF. 4. Coronary artery calcifications. Aortic Atherosclerosis (ICD10-I70.0). Electronically Signed   By: Jeb Levering M.D.   On: 05/02/2018 20:59   Dg Chest Portable 1 View  Result Date: 05/02/2018 CLINICAL DATA:  Shortness of breath EXAM: PORTABLE CHEST 1 VIEW COMPARISON:  05/18/2014 FINDINGS: Cardiac shadow is enlarged. Vascular congestion is noted consistent with mild CHF. No significant edema is seen. Old rib fractures are noted on the right with healing. No other focal abnormality is seen. IMPRESSION: Changes of CHF. Electronically Signed   By: Inez Catalina M.D.   On: 05/02/2018 18:39    Scheduled Meds: . albuterol  5 mg Nebulization Once  . aspirin EC  81 mg Oral Daily  . atorvastatin  80 mg Oral q1800  . cloNIDine  0.1 mg Oral BID  . enoxaparin (LOVENOX) injection  40 mg Subcutaneous Q24H  . furosemide  40 mg Oral BID  . insulin aspart  0-15 Units Subcutaneous TID WC  . insulin aspart  0-5 Units Subcutaneous QHS  . isosorbide-hydrALAZINE  1 tablet Oral BID  . nebivolol  5 mg Oral Daily  . pantoprazole  40 mg Oral Daily  . potassium chloride  20 mEq Oral BID  . sodium chloride flush  3 mL Intravenous Q12H   Continuous Infusions: . sodium chloride    . albuterol 10 mg/hr (05/02/18 1837)     LOS: 1 day    Time spent: 30 minutes   Barton Dubois, MD [facility] Pager [phone]  If 7PM-7AM, please contact night-coverage [email or web address] Password [email or web address] 05/03/2018, 11:07 AM

## 2018-05-03 NOTE — Care Management Note (Signed)
Case Management Note  Patient Details  Name: Alejandro [name] MRN: [id number] Date of Birth: 1941/03/28  Subjective/Objective:   Adm with CHF. From home, independent with ADL's. Recently widowed in January. 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 No home health or DME pta. Has PCP-Dr. Nevada Crane.                Action/Plan: Patient to transfer to [facility] for heart cath.   Expected Discharge Date:  05/04/18               Expected Discharge Plan:  Home/Self Care  In-House Referral:     Discharge planning Services  CM Consult  Post Acute Care Choice:  NA Choice offered to:  NA  DME Arranged:    DME Agency:     HH Arranged:    HH Agency:     Status of Service:  Completed, signed off  If discussed at [employer] of Stay Meetings, dates discussed:    Additional Comments:  [name], [name], RN 05/03/2018, 1:56 PM

## 2018-05-03 NOTE — Progress Notes (Signed)
CRITICAL VALUE ALERT  Critical Value:  Troponin 0.41  Date & Time Notied:  05/02/2018 @1155   Provider Notified: Hal Hope  Orders Received/Actions taken: per Dr Hal Hope, give patient his usual Aspirin dose if he has not already taken it. Patient reports he did take his Aspirin at home. Pt denies chest pain or dyspnea at this time.  Will continue to monitor.

## 2018-05-03 NOTE — Progress Notes (Signed)
Dr. Juel Burrow office called to inform us of patient's positive [facility] Spotted Fever results that were recently done in their office. Dr. Dyann Kief notified.

## 2018-05-04 ENCOUNTER — Encounter (HOSPITAL_COMMUNITY)
Admission: EM | Disposition: A | Discharge status: Home / Self Care | Payer: Self-pay | Source: Home / Self Care | Attending: Cardiovascular Disease

## 2018-05-04 ENCOUNTER — Encounter (HOSPITAL_COMMUNITY): Payer: Self-pay | Admitting: Interventional Cardiology

## 2018-05-04 DIAGNOSIS — J810 Acute pulmonary edema: Secondary | ICD-10-CM

## 2018-05-04 DIAGNOSIS — R0609 Other forms of dyspnea: Secondary | ICD-10-CM

## 2018-05-04 DIAGNOSIS — R0602 Shortness of breath: Secondary | ICD-10-CM

## 2018-05-04 DIAGNOSIS — I5021 Acute systolic (congestive) heart failure: Secondary | ICD-10-CM

## 2018-05-04 HISTORY — PX: LEFT HEART CATH AND CORONARY ANGIOGRAPHY: CATH118249

## 2018-05-04 LAB — PROTIME-INR
INR: 1.06
PROTHROMBIN TIME: 13.7 s (ref 11.4–15.2)

## 2018-05-04 LAB — BASIC METABOLIC PANEL
ANION GAP: 9 (ref 5–15)
BUN: 22 mg/dL — ABNORMAL HIGH (ref 6–20)
CALCIUM: 8.4 mg/dL — AB (ref 8.9–10.3)
CO2: 26 mmol/L (ref 22–32)
Chloride: 102 mmol/L (ref 101–111)
Creatinine, Ser: 1.21 mg/dL (ref 0.61–1.24)
GFR, EST NON AFRICAN AMERICAN: 57 mL/min — AB (ref >60)
GLUCOSE: 151 mg/dL — AB (ref 65–99)
Potassium: 4.4 mmol/L (ref 3.5–5.1)
SODIUM: 137 mmol/L (ref 135–145)

## 2018-05-04 LAB — GLUCOSE, CAPILLARY
GLUCOSE-CAPILLARY: 146 mg/dL — AB (ref 65–99)
GLUCOSE-CAPILLARY: 214 mg/dL — AB (ref 65–99)
GLUCOSE-CAPILLARY: 102 mg/dL — AB (ref 65–99)
Glucose-Capillary: 164 mg/dL — ABNORMAL HIGH (ref 65–99)

## 2018-05-04 SURGERY — LEFT HEART CATH AND CORONARY ANGIOGRAPHY
Anesthesia: LOCAL

## 2018-05-04 MED ORDER — NITROGLYCERIN 1 MG/10 ML FOR IR/CATH LAB
INTRA_ARTERIAL | Status: AC
  Filled 2018-05-04: qty 10

## 2018-05-04 MED ORDER — IOHEXOL 350 MG/ML SOLN
INTRAVENOUS | Status: DC | PRN
  Administered 2018-05-04: 170 mL via INTRA_ARTERIAL

## 2018-05-04 MED ORDER — HEPARIN (PORCINE) IN NACL 1000-0.9 UT/500ML-% IV SOLN
INTRAVENOUS | Status: AC
  Filled 2018-05-04: qty 1000

## 2018-05-04 MED ORDER — LIDOCAINE HCL (PF) 1 % IJ SOLN
INTRAMUSCULAR | Status: AC
  Filled 2018-05-04: qty 30

## 2018-05-04 MED ORDER — VERAPAMIL HCL 2.5 MG/ML IV SOLN
INTRAVENOUS | Status: DC | PRN
  Administered 2018-05-04: 10 mL via INTRA_ARTERIAL

## 2018-05-04 MED ORDER — ALPRAZOLAM 0.5 MG PO TABS
0.5000 mg | ORAL_TABLET | Freq: Two times a day (BID) | ORAL | Status: DC | PRN
  Administered 2018-05-05 – 2018-05-10 (×7): 0.5 mg via ORAL
  Filled 2018-05-04 (×7): qty 1

## 2018-05-04 MED ORDER — SODIUM CHLORIDE 0.9 % IV SOLN: 250.0000 mL | INTRAVENOUS | Status: DC | PRN

## 2018-05-04 MED ORDER — FENTANYL CITRATE (PF) 100 MCG/2ML IJ SOLN
INTRAMUSCULAR | Status: DC | PRN
  Administered 2018-05-04: 50 ug via INTRAVENOUS

## 2018-05-04 MED ORDER — NITROGLYCERIN 1 MG/10 ML FOR IR/CATH LAB
INTRA_ARTERIAL | Status: DC | PRN
  Administered 2018-05-04: 200 ug via INTRACORONARY

## 2018-05-04 MED ORDER — VERAPAMIL HCL 2.5 MG/ML IV SOLN
INTRAVENOUS | Status: AC
  Filled 2018-05-04: qty 2

## 2018-05-04 MED ORDER — SODIUM CHLORIDE 0.9% FLUSH: 3.0000 mL | INTRAVENOUS | Status: DC | PRN

## 2018-05-04 MED ORDER — HEPARIN (PORCINE) IN NACL 100-0.45 UNIT/ML-% IJ SOLN
1800.0000 [IU]/h | INTRAMUSCULAR | Status: DC
  Administered 2018-05-04: 1200 [IU]/h via INTRAVENOUS
  Administered 2018-05-05 – 2018-05-09 (×6): 1800 [IU]/h via INTRAVENOUS
  Filled 2018-05-04 (×12): qty 250

## 2018-05-04 MED ORDER — OXYCODONE HCL 5 MG PO TABS: 5.0000 mg | ORAL_TABLET | ORAL | Status: DC | PRN

## 2018-05-04 MED ORDER — SODIUM CHLORIDE 0.9 % IV SOLN
INTRAVENOUS | Status: AC
  Administered 2018-05-04: 11:00:00 via INTRAVENOUS

## 2018-05-04 MED ORDER — LIDOCAINE HCL (PF) 1 % IJ SOLN
INTRAMUSCULAR | Status: DC | PRN
  Administered 2018-05-04: 2 mL

## 2018-05-04 MED ORDER — ACETAMINOPHEN 325 MG PO TABS
650.0000 mg | ORAL_TABLET | ORAL | Status: DC | PRN
  Administered 2018-05-04 – 2018-05-08 (×7): 650 mg via ORAL
  Filled 2018-05-04 (×7): qty 2

## 2018-05-04 MED ORDER — FENTANYL CITRATE (PF) 100 MCG/2ML IJ SOLN
INTRAMUSCULAR | Status: AC
Start: 2018-05-04 — End: ?
  Filled 2018-05-04: qty 2

## 2018-05-04 MED ORDER — MIDAZOLAM HCL 2 MG/2ML IJ SOLN
INTRAMUSCULAR | Status: AC
  Filled 2018-05-04: qty 2

## 2018-05-04 MED ORDER — ASPIRIN 81 MG PO CHEW
81.0000 mg | CHEWABLE_TABLET | Freq: Every day | ORAL | Status: DC
  Administered 2018-05-05 – 2018-05-10 (×6): 81 mg via ORAL
  Filled 2018-05-04 (×6): qty 1

## 2018-05-04 MED ORDER — MIDAZOLAM HCL 2 MG/2ML IJ SOLN
INTRAMUSCULAR | Status: DC | PRN
  Administered 2018-05-04: 1 mg via INTRAVENOUS

## 2018-05-04 MED ORDER — ONDANSETRON HCL 4 MG/2ML IJ SOLN: 4.0000 mg | Freq: Four times a day (QID) | INTRAMUSCULAR | Status: DC | PRN

## 2018-05-04 MED ORDER — HEPARIN SODIUM (PORCINE) 1000 UNIT/ML IJ SOLN
INTRAMUSCULAR | Status: AC
  Filled 2018-05-04: qty 1

## 2018-05-04 MED ORDER — SODIUM CHLORIDE 0.9% FLUSH
3.0000 mL | Freq: Two times a day (BID) | INTRAVENOUS | Status: DC
  Administered 2018-05-04 – 2018-05-10 (×12): 3 mL via INTRAVENOUS

## 2018-05-04 MED ORDER — HEPARIN (PORCINE) IN NACL 2-0.9 UNITS/ML
INTRAMUSCULAR | Status: AC | PRN
  Administered 2018-05-04 (×2): 500 mL

## 2018-05-04 MED ORDER — HEPARIN SODIUM (PORCINE) 1000 UNIT/ML IJ SOLN
INTRAMUSCULAR | Status: DC | PRN
  Administered 2018-05-04: 5000 [IU] via INTRAVENOUS

## 2018-05-04 SURGICAL SUPPLY — 16 items
CATH INFINITI 5 FR JL3.5 (CATHETERS) ×1 IMPLANT
CATH INFINITI 5FR AL1 (CATHETERS) ×1 IMPLANT
CATH INFINITI 5FR JL4 (CATHETERS) ×1 IMPLANT
CATH INFINITI JR4 5F (CATHETERS) ×1 IMPLANT
CATH LAUNCHER 5F RADR (CATHETERS) IMPLANT
CATH LAUNCHER 6FR IMA (CATHETERS) ×1 IMPLANT
CATHETER LAUNCHER 5F RADR (CATHETERS) ×2
DEVICE RAD COMP TR BAND LRG (VASCULAR PRODUCTS) ×1 IMPLANT
GLIDESHEATH SLEND A-KIT 6F 22G (SHEATH) ×1 IMPLANT
GUIDEWIRE INQWIRE 1.5J.035X260 (WIRE) IMPLANT
INQWIRE 1.5J .035X260CM (WIRE) ×2
KIT HEART LEFT (KITS) ×2 IMPLANT
PACK CARDIAC CATHETERIZATION (CUSTOM PROCEDURE TRAY) ×2 IMPLANT
TRANSDUCER W/STOPCOCK (MISCELLANEOUS) ×2 IMPLANT
TUBING CIL FLEX 10 FLL-RA (TUBING) ×2 IMPLANT
WIRE EMERALD ST .035X150CM (WIRE) ×1 IMPLANT

## 2018-05-04 NOTE — Care Management Note (Signed)
Case Management Note Previous Note Created by [facility]  Patient Details  Name: Alejandro [name] MRN: [id number] Date of Birth: [birth year]  Subjective/Objective:   Adm with CHF. From home, independent with ADL's. Recently widowed in January. 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 No home health or DME pta. Has PCP-Dr. Nevada Crane.                Action/Plan: Patient to transfer to [facility] for heart cath.   Expected Discharge Date:  05/04/18               Expected Discharge Plan:  Home/Self Care  In-House Referral:     Discharge planning Services  CM Consult  Post Acute Care Choice:  NA Choice offered to:  NA  DME Arranged:    DME Agency:     HH Arranged:    HH Agency:     Status of Service:  Completed, signed off  If discussed at [employer] of Stay Meetings, dates discussed:    Additional Comments:  05/04/2018 Pt independent prior to discharge.  Pt informed CM that he has independently ambulated in room while hospitalized - PT/OT order not deemed neccessary. Pts family will be available to pt post discharge if a need arises.  Pt educated on daily weights and low sodium diet Maryclare Labrador, RN 05/04/2018, 2:16 PM

## 2018-05-04 NOTE — Interval H&P Note (Signed)
Cath Lab Visit (complete for each Cath Lab visit)  Clinical Evaluation Leading to the Procedure:   ACS: Yes.    Non-ACS:    Anginal Classification: CCS III  Anti-ischemic medical therapy: Maximal Therapy (2 or more classes of medications)  Non-Invasive Test Results: High-risk stress test findings: cardiac mortality >3%/year  Prior CABG: No previous CABG      History and Physical Interval Note:  05/04/2018 8:53 AM  Alejandro [name]  has presented today for surgery, with the diagnosis of n stemi  The various methods of treatment have been discussed with the patient and family. After consideration of risks, benefits and other options for treatment, the patient has consented to  Procedure(s): LEFT HEART CATH AND CORONARY ANGIOGRAPHY (N/A) as a surgical intervention .  The patient's history has been reviewed, patient examined, no change in status, stable for surgery.  I have reviewed the patient's chart and labs.  Questions were answered to the patient's satisfaction.     Belva Crome III

## 2018-05-04 NOTE — Progress Notes (Signed)
ANTICOAGULATION CONSULT NOTE - Initial Consult  Pharmacy Consult for Heparin Indication: chest pain/ACS  No Known Allergies  Patient Measurements: Height: 5\' 11"  (180.3 cm) Weight: 251 lb 5.2 oz (114 kg) IBW/kg (Calculated) : 75.3 Heparin Dosing Weight: 100.4 kg  Vital Signs: Temp: 97.6 F (36.4 C) (06/04 0725) Temp Source: Oral (06/04 0725) BP: 143/83 (06/04 1026) Pulse Rate: 64 (06/04 1026)  Labs: Recent Labs    05/02/18 1825 05/02/18 2302 05/03/18 0444 05/03/18 1014 05/04/18 0246  HGB 14.3  --  13.5  --   --   HCT 44.8  --  41.8  --   --   PLT 325  --  263  --   --   LABPROT  --   --   --   --  13.7  INR  --   --   --   --  1.06  CREATININE 1.51*  --  1.32*  --  1.21  TROPONINI 0.03* 0.41* 1.04* 1.19*  --     Estimated Creatinine Clearance: 67.7 mL/min (by C-G formula based on SCr of 1.21 mg/dL).   Medical History: Past Medical History:  Diagnosis Date  . History of nephrolithiasis 2004  . Hyperlipidemia   . Hypertension   . IBS (irritable colon syndrome)   . Reflux   . Type 2 diabetes mellitus [facility])     Assessment: 77 yo male here for SOB and abnormal troponins, now s/p cath. Starting Heparin drip 8 hourts post sheath removal  Goal of Therapy:  Heparin level 0.3-0.7 units/ml Monitor platelets by anticoagulation protocol: Yes   Plan:  At 1830, Start heparin infusion at 1200 units/hr Check anti-Xa level in 6 hours and daily while on heparin Continue to monitor H&H and platelets  Corinda Gubler, PharmD, [facility] Clinical Pharmacist 05/04/2018,11:42 AM

## 2018-05-04 NOTE — Progress Notes (Signed)
Pt. transferred via [facility] from [facility] to [facility]; alert and oriented x4; ambulatory; oriented to call button and room; no c/o pain.

## 2018-05-04 NOTE — Progress Notes (Addendum)
Progress Note  Patient Name: Alejandro [name] Date of Encounter: 05/04/2018  Primary Cardiologist: No primary care provider on file.   Subjective   Feeling much better.  Thinks his breathing is back to baseline after diuresis.  Denies chest pain.   Inpatient Medications    Scheduled Meds: . albuterol  5 mg Nebulization Once  . aspirin EC  81 mg Oral Daily  . atorvastatin  80 mg Oral q1800  . cloNIDine  0.1 mg Oral BID  . doxycycline  100 mg Oral Q12H  . enoxaparin (LOVENOX) injection  40 mg Subcutaneous Q24H  . furosemide  40 mg Oral BID  . insulin aspart  0-15 Units Subcutaneous TID WC  . insulin aspart  0-5 Units Subcutaneous QHS  . isosorbide-hydrALAZINE  1 tablet Oral BID  . nebivolol  5 mg Oral Daily  . pantoprazole  40 mg Oral Daily  . potassium chloride  20 mEq Oral BID  . sodium chloride flush  3 mL Intravenous Q12H  . sodium chloride flush  3 mL Intravenous Q12H   Continuous Infusions: . sodium chloride    . sodium chloride    . sodium chloride 10 mL/hr at 05/04/18 0558  . albuterol 10 mg/hr (05/02/18 1837)   PRN Meds: sodium chloride, sodium chloride, acetaminophen, ALPRAZolam, ondansetron (ZOFRAN) IV, sodium chloride flush, sodium chloride flush   Vital Signs    Vitals:   05/03/18 2015 05/04/18 0010 05/04/18 0343 05/04/18 0725  BP: (!) 173/92 (!) 111/53 (!) 146/87 (!) 178/109  Pulse: 79 74 65 68  Resp: 17 18 (!) 21 16  Temp:  97.7 F (36.5 C) 97.6 F (36.4 C)   TempSrc:  Oral Oral   SpO2: 97% 97% 98% 98%  Weight:   251 lb 5.2 oz (114 kg)   Height:        Intake/Output Summary (Last 24 hours) at 05/04/2018 0831 Last data filed at 05/03/2018 1600 Gross per 24 hour  Intake 3 ml  Output 400 ml  Net -397 ml   Filed Weights   05/02/18 2239 05/03/18 0500 05/04/18 0343  Weight: 253 lb 15.5 oz (115.2 kg) 253 lb 15.5 oz (115.2 kg) 251 lb 5.2 oz (114 kg)    Telemetry    Sinus rhythm.  No events.  - Personally Reviewed  ECG    Sinus tachycardia.   Rate 111 bpm.  Frequent PVCs.  Inferolateral ST depression.  - Personally Reviewed  Physical Exam   GEN: No acute distress.   Neck: No JVD Cardiac: RRR, no murmurs, rubs, or gallops.  Respiratory: Clear to auscultation bilaterally. GI: Soft, nontender, non-distended  MS: No edema; No deformity. Neuro:  Nonfocal  Psych: Normal affect   Labs    Chemistry Recent Labs  Lab 05/02/18 1825 05/03/18 0444 05/04/18 0246  NA 138 142 137  K 3.3* 4.3 4.4  CL 103 105 102  CO2 16* 23 26  GLUCOSE 297* 219* 151*  BUN 20 23* 22*  CREATININE 1.51* 1.32* 1.21  CALCIUM 8.6* 8.9 8.4*  PROT 7.3  --   --   ALBUMIN 3.8  --   --   AST 25  --   --   ALT 16*  --   --   ALKPHOS 62  --   --   BILITOT 0.6  --   --   GFRNONAA 43* 51* 57*  GFRAA 50* 59* >60  ANIONGAP 19* 14 9     Hematology Recent Labs  Lab 05/02/18 1825  05/03/18 0444  WBC 19.6* 10.4  RBC 4.85 4.69  HGB 14.3 13.5  HCT 44.8 41.8  MCV 92.4 89.1  MCH 29.5 28.8  MCHC 31.9 32.3  RDW 16.0* 16.1*  PLT 325 263    Cardiac Enzymes Recent Labs  Lab 05/02/18 1825 05/02/18 2302 05/03/18 0444 05/03/18 1014  TROPONINI 0.03* 0.41* 1.04* 1.19*   No results for input(s): TROPIPOC in the last 168 hours.   BNP Recent Labs  Lab 05/02/18 1825  BNP 632.0*     DDimer  Recent Labs  Lab 05/02/18 1825  DDIMER 1.13*     Radiology    Ct Angio Chest Pe W And/or Wo Contrast  Result Date: 05/02/2018 CLINICAL DATA:  Shortness of breath. PE suspected, intermediate prob, positive D-dimer EXAM: CT ANGIOGRAPHY CHEST WITH CONTRAST TECHNIQUE: Multidetector CT imaging of the chest was performed using the standard protocol during bolus administration of intravenous contrast. Multiplanar CT image reconstructions and MIPs were obtained to evaluate the vascular anatomy. CONTRAST:  68mL ISOVUE-370 IOPAMIDOL (ISOVUE-370) INJECTION 76% COMPARISON:  Chest radiograph earlier this day. FINDINGS: Cardiovascular: Suboptimal contrast bolus for  evaluation of the pulmonary arteries distal to the lobar level, no filling defects within the main or lobar pulmonary arteries. Respiratory motion further degrades evaluation. Mild cardiomegaly. There are coronary artery calcifications. Normal caliber thoracic aorta without dissection. Mild atherosclerosis and tortuosity. No pericardial effusion. Mediastinum/Nodes: Prominent mediastinal nodes at multiple stations, including 14 mm left paratracheal, 15 mm lower right paratracheal, 11 mm prevascular and 13 mm subcarinal nodes. Few prominent bilateral hilar nodes. No thyroid nodule. The esophagus is decompressed. Lungs/Pleura: Small to moderate bilateral pleural effusions. Multifocal patchy ground-glass opacities with smooth septal thickening, likely pulmonary edema. Compressive atelectasis adjacent to pleural effusions. No evident pulmonary mass. Trachea mainstem bronchi are patent. Upper Abdomen: No acute findings. Musculoskeletal: There are no acute or suspicious osseous abnormalities. Remote right anterior rib fractures. Review of the MIP images confirms the above findings. IMPRESSION: 1. No central pulmonary embolus, evaluation distal to the lobar level is limited due to contrast bolus timing and soft tissue attenuation from habitus. 2. Small to moderate bilateral pleural effusions. Ground-glass opacities and smooth septal thickening likely pulmonary edema. Findings consistent with CHF. 3. Shoddy mediastinal adenopathy is likely secondary to CHF. 4. Coronary artery calcifications. Aortic Atherosclerosis (ICD10-I70.0). Electronically Signed   By: Jeb Levering M.D.   On: 05/02/2018 20:59   Dg Chest Portable 1 View  Result Date: 05/02/2018 CLINICAL DATA:  Shortness of breath EXAM: PORTABLE CHEST 1 VIEW COMPARISON:  05/18/2014 FINDINGS: Cardiac shadow is enlarged. Vascular congestion is noted consistent with mild CHF. No significant edema is seen. Old rib fractures are noted on the right with healing. No other  focal abnormality is seen. IMPRESSION: Changes of CHF. Electronically Signed   By: Inez Catalina M.D.   On: 05/02/2018 18:39    Cardiac Studies   Echo 05/03/18: Study Conclusions  - Left ventricle: The cavity size was moderately dilated. Wall   thickness was increased in a pattern of mild LVH. Systolic   function was normal. The estimated ejection fraction was in the   range of 50% to 55%. There is mild hypokinesis of the   basal-midinferior myocardium. Features are consistent with a   pseudonormal left ventricular filling pattern, with concomitant   abnormal relaxation and increased filling pressure (grade 2   diastolic dysfunction). - Aortic valve: Mildly to moderately calcified annulus. Trileaflet;   moderately calcified leaflets. Noncoronary cusp mobility was   restricted.  There was mild stenosis. There was trivial   regurgitation. Mean gradient (S): 22 mm Hg. Peak gradient (S): 39   mm Hg. VTI ratio of LVOT to aortic valve: 0.42. Valve area (VTI):   2.08 cm^2. Valve area (Vmax): 1.9 cm^2. Valve area (Vmean): 1.86   cm^2. - Mitral valve: Moderately calcified annulus. There was mild   regurgitation. - Left atrium: The atrium was moderately dilated. - Right atrium: Central venous pressure (est): 8 mm Hg. - Atrial septum: No defect or patent foramen ovale was identified. - Tricuspid valve: There was trivial regurgitation. - Pulmonary arteries: Systolic pressure was moderately increased.   PA peak pressure: 50 mm Hg (S). - Coronary sinus: The vessel was dilated. - Pericardium, extracardiac: There was no pericardial effusion.  Patient Profile     77 y.o. male with hypertension, hyperlipidemia, diabetes, and mild aortic stenosis here with NSTEMI and acute diastolic heart failure.    Assessment & Plan    # NSTEMI: Troponin mildly elevated to 1.19.  He has not had any chest pain or pressure but has had progressive exertional dyspnea.  Echo revealed focal wall motion abnormalities in  the basal inferior myocardium.  EKG also had ST depressions.  He will go for left heart catheterization today.  He has an extensive family history of CAD.  Continue aspirin, atorvastatin, and nebivolol.  # Moderate aortic stenosis: Mean gradient 22 mmHg on echo this admission.  # Acute diastolic heart failure: Feeling better with diuresis.  Continue lasix 40mg  po bid. RA pressure 8 mmHg on echo.    # Hypertension: On nebivolol, Bidil and clonidine.  Given his diabetes he should be on an ACE-I or ARB.  Renal function improved today.  Will start tomorrow given that he is going for cath today.  Reduce clonidine to 0.5mg  bid then titrate off.  # Hyperlipidemia:  Atorvastatin started this admission.  Check lipids/CMP in 6-8 weeks.     For questions or updates, please contact [facility] Please consult [email or web address] for contact info under Cardiology/STEMI.      Signed, Skeet Latch, MD  05/04/2018, 8:31 AM

## 2018-05-04 NOTE — Plan of Care (Signed)
  Problem: Education: Goal: Knowledge of General Education information will improve Outcome: Progressing Note:  POC and orders reviewed with pt.; informed that he's NPO at 59mn tonight for cardiac cath in am.

## 2018-05-05 ENCOUNTER — Inpatient Hospital Stay (HOSPITAL_COMMUNITY): Payer: Medicare Other

## 2018-05-05 DIAGNOSIS — Q245 Malformation of coronary vessels: Secondary | ICD-10-CM

## 2018-05-05 DIAGNOSIS — I2510 Atherosclerotic heart disease of native coronary artery without angina pectoris: Secondary | ICD-10-CM | POA: Diagnosis not present

## 2018-05-05 DIAGNOSIS — I700 Atherosclerosis of aorta: Secondary | ICD-10-CM | POA: Diagnosis not present

## 2018-05-05 DIAGNOSIS — I214 Non-ST elevation (NSTEMI) myocardial infarction: Secondary | ICD-10-CM | POA: Diagnosis not present

## 2018-05-05 LAB — BASIC METABOLIC PANEL
Anion gap: 9 (ref 5–15)
BUN: 18 mg/dL (ref 6–20)
CHLORIDE: 103 mmol/L (ref 101–111)
CO2: 23 mmol/L (ref 22–32)
CREATININE: 1.23 mg/dL (ref 0.61–1.24)
Calcium: 8.8 mg/dL — ABNORMAL LOW (ref 8.9–10.3)
GFR calc Af Amer: >60 mL/min (ref >60)
GFR calc non Af Amer: 56 mL/min — ABNORMAL LOW (ref >60)
Glucose, Bld: 228 mg/dL — ABNORMAL HIGH (ref 65–99)
Potassium: 4.1 mmol/L (ref 3.5–5.1)
SODIUM: 135 mmol/L (ref 135–145)

## 2018-05-05 LAB — GLUCOSE, CAPILLARY
GLUCOSE-CAPILLARY: 130 mg/dL — AB (ref 65–99)
Glucose-Capillary: 133 mg/dL — ABNORMAL HIGH (ref 65–99)
Glucose-Capillary: 125 mg/dL — ABNORMAL HIGH (ref 65–99)
Glucose-Capillary: 133 mg/dL — ABNORMAL HIGH (ref 65–99)

## 2018-05-05 LAB — HEPARIN LEVEL (UNFRACTIONATED)
HEPARIN UNFRACTIONATED: 0.18 [IU]/mL — AB (ref 0.30–0.70)
HEPARIN UNFRACTIONATED: 0.34 [IU]/mL (ref 0.30–0.70)
Heparin Unfractionated: 0.11 IU/mL — ABNORMAL LOW (ref 0.30–0.70)

## 2018-05-05 MED ORDER — IOPAMIDOL (ISOVUE-370) INJECTION 76%
INTRAVENOUS | Status: AC
  Filled 2018-05-05: qty 100

## 2018-05-05 MED ORDER — IOPAMIDOL (ISOVUE-370) INJECTION 76%
100.0000 mL | Freq: Once | INTRAVENOUS | Status: AC | PRN
  Administered 2018-05-05: 80 mL via INTRAVENOUS

## 2018-05-05 MED ORDER — NITROGLYCERIN 0.4 MG SL SUBL
0.8000 mg | SUBLINGUAL_TABLET | Freq: Once | SUBLINGUAL | Status: AC
  Administered 2018-05-05: 0.8 mg via SUBLINGUAL

## 2018-05-05 MED ORDER — NITROGLYCERIN 0.4 MG SL SUBL
SUBLINGUAL_TABLET | SUBLINGUAL | Status: AC
  Filled 2018-05-05: qty 2

## 2018-05-05 MED FILL — Heparin Sod (Porcine)-NaCl IV Soln 1000 Unit/500ML-0.9%: INTRAVENOUS | Qty: 1000 | Status: AC

## 2018-05-05 NOTE — Progress Notes (Signed)
ANTICOAGULATION CONSULT NOTE - Follow Up Consult  Pharmacy Consult for heparin Indication: CAD awaiting surgical consult  Labs: Recent Labs    05/02/18 1825 05/02/18 2302 05/03/18 0444 05/03/18 1014 05/04/18 0246 05/05/18 0048  HGB 14.3  --  13.5  --   --   --   HCT 44.8  --  41.8  --   --   --   PLT 325  --  263  --   --   --   LABPROT  --   --   --   --  13.7  --   INR  --   --   --   --  1.06  --   HEPARINUNFRC  --   --   --   --   --  0.11*  CREATININE 1.51*  --  1.32*  --  1.21  --   TROPONINI 0.03* 0.41* 1.04* 1.19*  --   --     Assessment: 77yo male subtherapeutic on heparin with initial dosing post-cath; RN reports no gtt issues or signs of bleeding.  Goal of Therapy:  Heparin level 0.3-0.7 units/ml   Plan:  Will increase heparin gtt by 3 units/kg/hr to 1500 units/hr and check level in 8 hours.    Wynona Neat, PharmD, BCPS  05/05/2018,1:29 AM

## 2018-05-05 NOTE — Progress Notes (Addendum)
ANTICOAGULATION CONSULT NOTE - Follow Up Consult  Pharmacy Consult for heparin Indication: CAD awaiting surgical consult  Labs: Recent Labs    05/02/18 1825 05/02/18 2302 05/03/18 0444 05/03/18 1014 05/04/18 0246 05/05/18 0048  HGB 14.3  --  13.5  --   --   --   HCT 44.8  --  41.8  --   --   --   PLT 325  --  263  --   --   --   LABPROT  --   --   --   --  13.7  --   INR  --   --   --   --  1.06  --   HEPARINUNFRC  --   --   --   --   --  0.11*  CREATININE 1.51*  --  1.32*  --  1.21  --   TROPONINI 0.03* 0.41* 1.04* 1.19*  --   --     Assessment: 76yo male s/p cath. Difficult procedure, no intervention performed. Heparin restarted post cath, CTA pending to determine possible cath vs surgery.   Goal of Therapy:  Heparin level 0.3-0.7 units/ml   Plan:  Heparin currently running at 1500 units/hr Heparin level this morning  Alejandro [name] PharmD., BCPS Clinical Pharmacist 05/05/2018 10:10 AM  Addendum: Heparin level low this morning on repeat. Increase rate to 1800 units/hr and recheck this evening.

## 2018-05-05 NOTE — Progress Notes (Signed)
Progress Note  Patient Name: Alejandro [name] Date of Encounter: 05/05/2018  Primary Cardiologist: Rozann Lesches, MD   Subjective   Feeling well.  No chest pain or shortness of breath.  Wants to go home.   Inpatient Medications    Scheduled Meds: . albuterol  5 mg Nebulization Once  . aspirin  81 mg Oral Daily  . atorvastatin  80 mg Oral q1800  . cloNIDine  0.1 mg Oral BID  . doxycycline  100 mg Oral Q12H  . furosemide  40 mg Oral BID  . insulin aspart  0-15 Units Subcutaneous TID WC  . insulin aspart  0-5 Units Subcutaneous QHS  . isosorbide-hydrALAZINE  1 tablet Oral BID  . nebivolol  5 mg Oral Daily  . pantoprazole  40 mg Oral Daily  . potassium chloride  20 mEq Oral BID  . sodium chloride flush  3 mL Intravenous Q12H   Continuous Infusions: . sodium chloride    . albuterol 10 mg/hr (05/02/18 1837)  . heparin 1,500 Units/hr (05/05/18 0131)   PRN Meds: sodium chloride, acetaminophen, ALPRAZolam, ondansetron (ZOFRAN) IV, oxyCODONE, sodium chloride flush   Vital Signs    Vitals:   05/04/18 1900 05/04/18 2300 05/05/18 0300 05/05/18 0756  BP: (!) 166/82 137/76 (!) 150/89 (!) 155/81  Pulse: 70 74 65 65  Resp: 16 19 20 17   Temp: 98.1 F (36.7 C) 97.9 F (36.6 C) 98.2 F (36.8 C) 98 F (36.7 C)  TempSrc: Oral Oral Oral Oral  SpO2: 99% 92% 97% 96%  Weight:   243 lb 9.7 oz (110.5 kg)   Height:   5\' 11"  (1.803 m)     Intake/Output Summary (Last 24 hours) at 05/05/2018 0841 Last data filed at 05/05/2018 0811 Gross per 24 hour  Intake 1497 ml  Output 3100 ml  Net -1603 ml   Filed Weights   05/03/18 0500 05/04/18 0343 05/05/18 0300  Weight: 253 lb 15.5 oz (115.2 kg) 251 lb 5.2 oz (114 kg) 243 lb 9.7 oz (110.5 kg)    Telemetry    Sinus rhythm, sinus bradycardia.  No events.  - Personally Reviewed  ECG    Sinus tachycardia.  Rate 111 bpm.  Frequent PVCs.  Inferolateral ST depression.  - Personally Reviewed  Physical Exam   VS:  BP (!) 155/81 (BP Location:  Left Arm)   Pulse 65   Temp 98 F (36.7 C) (Oral)   Resp 17   Ht 5\' 11"  (1.803 m)   Wt 243 lb 9.7 oz (110.5 kg)   SpO2 96%   BMI 33.98 kg/m  , BMI Body mass index is 33.98 kg/m. GENERAL:  Well appearing HEENT: Pupils equal round and reactive, fundi not visualized, oral mucosa unremarkable NECK:  No jugular venous distention, waveform within normal limits, carotid upstroke brisk and symmetric, no bruits LUNGS:  Clear to auscultation bilaterally HEART:  RRR.  PMI not displaced or sustained,S1 and S2 within normal limits, no S3, no S4, no clicks, no rubs, no murmurs ABD:  Flat, positive bowel sounds normal in frequency in pitch, no bruits, no rebound, no guarding, no midline pulsatile mass, no hepatomegaly, no splenomegaly EXT:  2 plus pulses throughout, no edema, no cyanosis no clubbing SKIN:  No rashes no nodules NEURO:  Cranial nerves II through XII grossly intact, motor grossly intact throughout [facility]:  Cognitively intact, oriented to person place and time   Labs    Chemistry Recent Labs  Lab 05/02/18 1825 05/03/18 0444 05/04/18 0246  NA 138 142 137  K 3.3* 4.3 4.4  CL 103 105 102  CO2 16* 23 26  GLUCOSE 297* 219* 151*  BUN 20 23* 22*  CREATININE 1.51* 1.32* 1.21  CALCIUM 8.6* 8.9 8.4*  PROT 7.3  --   --   ALBUMIN 3.8  --   --   AST 25  --   --   ALT 16*  --   --   ALKPHOS 62  --   --   BILITOT 0.6  --   --   GFRNONAA 43* 51* 57*  GFRAA 50* 59* >60  ANIONGAP 19* 14 9     Hematology Recent Labs  Lab 05/02/18 1825 05/03/18 0444  WBC 19.6* 10.4  RBC 4.85 4.69  HGB 14.3 13.5  HCT 44.8 41.8  MCV 92.4 89.1  MCH 29.5 28.8  MCHC 31.9 32.3  RDW 16.0* 16.1*  PLT 325 263    Cardiac Enzymes Recent Labs  Lab 05/02/18 1825 05/02/18 2302 05/03/18 0444 05/03/18 1014  TROPONINI 0.03* 0.41* 1.04* 1.19*   No results for input(s): TROPIPOC in the last 168 hours.   BNP Recent Labs  Lab 05/02/18 1825  BNP 632.0*     DDimer  Recent Labs  Lab  05/02/18 1825  DDIMER 1.13*     Radiology    No results found.  Cardiac Studies   Echo 05/03/18: Study Conclusions  - Left ventricle: The cavity size was moderately dilated. Wall   thickness was increased in a pattern of mild LVH. Systolic   function was normal. The estimated ejection fraction was in the   range of 50% to 55%. There is mild hypokinesis of the   basal-midinferior myocardium. Features are consistent with a   pseudonormal left ventricular filling pattern, with concomitant   abnormal relaxation and increased filling pressure (grade 2   diastolic dysfunction). - Aortic valve: Mildly to moderately calcified annulus. Trileaflet;   moderately calcified leaflets. Noncoronary cusp mobility was   restricted. There was mild stenosis. There was trivial   regurgitation. Mean gradient (S): 22 mm Hg. Peak gradient (S): 39   mm Hg. VTI ratio of LVOT to aortic valve: 0.42. Valve area (VTI):   2.08 cm^2. Valve area (Vmax): 1.9 cm^2. Valve area (Vmean): 1.86   cm^2. - Mitral valve: Moderately calcified annulus. There was mild   regurgitation. - Left atrium: The atrium was moderately dilated. - Right atrium: Central venous pressure (est): 8 mm Hg. - Atrial septum: No defect or patent foramen ovale was identified. - Tricuspid valve: There was trivial regurgitation. - Pulmonary arteries: Systolic pressure was moderately increased.   PA peak pressure: 50 mm Hg (S). - Coronary sinus: The vessel was dilated. - Pericardium, extracardiac: There was no pericardial effusion.  LHC 05/04/18:   Technically difficult procedure to complete due to subclavian and innominate artery tortuosity.  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  The vessel contains diffuse luminal irregularities from the proximal to the distal third of the vessel.  No focal high-grade obstruction is noted.  Need to exclude  course between pulmonary artery and aorta as well as slitlike orifice/intramural course.  First diagonal, which arises from the left main, contains calcified segmental 95% stenosis beyond the septal perforator.  There is also a large septal perforator that arises from the diagonal.  Circumflex coronary artery gives origin to a trifurcating obtuse marginal #1 with the first  branch of the trifurcation containing 95 to 99% stenosis.  This branch is small to moderate in size.  Continuation of the circumflex beyond the first obtuse marginal contains 70% stenosis before ending on a very tiny second obtuse marginal branch.  The left main is widely patent.  The right coronary artery is dominant and contains 50 to 60% distal stenosis, PDA contains 75% ostial stenosis, first LV branch contains segmental 80% stenosis, and large second left ventricular branch contains segmental 60 to 70% proximal narrowing.  Overall LV function could not be assessed by the study.  EDP is markedly elevated at 26 mmHg suggesting an acute on chronic diastolic heart failure process given echo EF of 55%.  RECOMMENDATIONS:   Coronary CT angio to evaluate the course and ostium of the LAD.  If there are high risk features, should consider surgical consult and heart team approach concerning revascularization options. The AAOCA-LAD supplies the mid and distal third of the anterior wall.  If LAD is not potentially ischemia inducing, diagonal could be intervened upon.  The branch of the obtuse marginal would be more difficult.  For the time being, medical management of heart failure, monitor kidney function, risk factor modification, and and decision concerning revascularization after CT.   Coronary CT-A 05/05/18: pending  Patient Profile     77 y.o. male with obstructive CAD, anomalous LAD, hypertension, hyperlipidemia, diabetes, and mild aortic stenosis here with NSTEMI and acute diastolic heart failure.    Assessment & Plan     # NSTEMI:  # Anomalous coronary: Troponin mildly elevated to 1.19.  He has not had any chest pain or pressure but has had progressive exertional dyspnea.  Echo revealed focal wall motion abnormalities in the basal inferior myocardium.  EKG also had ST depressions.  He  underwent left heart catheterization 05/04/2018 and was found to have an anomalous LAD arising from the right sinus of Valsalva superior to the origin of the RCA.  No high-grade obstruction was noted in this vessel, but it was unclear if the course went between the pulmonary artery and the aorta.  He will have a coronary CT-a today to better assess.  He also had high-grade stenosis of first diagonal which arises from the left main.  There was 95 to 99% stenosis of a trifurcating obtuse marginal that was small to moderate in size.  There is also 70% left circumflex stenosis and 50 to 60% RCA stenosis with 75% ostial PDA stenosis.  The diagonal could potentially be intervened upon if a malignant course of the LAD is not the cause of his symptoms.  Continue aspirin, atorvastatin, heparin, and nebivolol.  # Moderate aortic stenosis: Mean gradient 22 mmHg on echo this admission.  # Acute diastolic heart failure: Feeling better with diuresis.  LVEDP was 26 mmHg on cath.  He was net -1.6L yesterday and -5.3L for the hospitalization.  He is feeling better no longer has crackles on exam.  BNP pending.  Continue lasix 40mg  po bid. RA pressure 8 mmHg on echo.     # Hypertension: On nebivolol, Bidil and clonidine.  Given his diabetes he should be on an ACE-I or ARB.  Renal function improved since admission but BMP is pending for today.  Given that he received contrast dye yesterday and will receive more for his CTA today, we will not start an ARB  or ACE inhibitor today.  He may also need PCI tomorrow.  Will start prior to discharge and wean clonidine if renal function is  stable.   # Hyperlipidemia:  Atorvastatin started this admission.  Check  lipids/CMP in 6-8 weeks.     For questions or updates, please contact [employer] Please consult [email or web address] for contact info under Cardiology/STEMI.      Signed, Skeet Latch, MD  05/05/2018, 8:41 AM

## 2018-05-05 NOTE — Progress Notes (Signed)
ANTICOAGULATION CONSULT NOTE - Follow Up Consult  Pharmacy Consult for heparin Indication: CAD awaiting surgical consult  Labs: Recent Labs    05/02/18 2302 05/03/18 0444 05/03/18 1014 05/04/18 0246 05/05/18 0048 05/05/18 0956 05/05/18 1956  HGB  --  13.5  --   --   --   --   --   HCT  --  41.8  --   --   --   --   --   PLT  --  263  --   --   --   --   --   LABPROT  --   --   --  13.7  --   --   --   INR  --   --   --  1.06  --   --   --   HEPARINUNFRC  --   --   --   --  0.11* 0.18* 0.34  CREATININE  --  1.32*  --  1.21  --  1.23  --   TROPONINI 0.41* 1.04* 1.19*  --   --   --   --     Assessment: 77 yo male s/p cath. Difficult procedure, no intervention performed. Heparin restarted post cath, pending decision of PCI vs surgery.   Heparin level is therapeutic on 1800 units/hr.  Goal of Therapy:  Heparin level 0.3-0.7 units/ml   Plan:  Continue heparin at 1800 units/hr. Next heparin level with AM labs.  [employer], Pharm.D., BCPS Clinical Pharmacist 05/05/2018 9:05 PM

## 2018-05-06 ENCOUNTER — Other Ambulatory Visit: Payer: Self-pay | Admitting: *Deleted

## 2018-05-06 DIAGNOSIS — I2510 Atherosclerotic heart disease of native coronary artery without angina pectoris: Secondary | ICD-10-CM

## 2018-05-06 DIAGNOSIS — I110 Hypertensive heart disease with heart failure: Secondary | ICD-10-CM

## 2018-05-06 DIAGNOSIS — E119 Type 2 diabetes mellitus without complications: Secondary | ICD-10-CM

## 2018-05-06 DIAGNOSIS — I10 Essential (primary) hypertension: Secondary | ICD-10-CM

## 2018-05-06 LAB — CBC
HCT: 42.5 % (ref 39.0–52.0)
Hemoglobin: 14 g/dL (ref 13.0–17.0)
MCH: 28.7 pg (ref 26.0–34.0)
MCHC: 32.9 g/dL (ref 30.0–36.0)
MCV: 87.3 fL (ref 78.0–100.0)
PLATELETS: 266 10*3/uL (ref 150–400)
RBC: 4.87 MIL/uL (ref 4.22–5.81)
RDW: 15.2 % (ref 11.5–15.5)
WBC: 10.4 10*3/uL (ref 4.0–10.5)

## 2018-05-06 LAB — BASIC METABOLIC PANEL
Anion gap: 9 (ref 5–15)
BUN: 17 mg/dL (ref 6–20)
CALCIUM: 9.1 mg/dL (ref 8.9–10.3)
CO2: 27 mmol/L (ref 22–32)
CREATININE: 1.29 mg/dL — AB (ref 0.61–1.24)
Chloride: 101 mmol/L (ref 101–111)
GFR, EST NON AFRICAN AMERICAN: 53 mL/min — AB (ref >60)
Glucose, Bld: 148 mg/dL — ABNORMAL HIGH (ref 65–99)
Potassium: 4.3 mmol/L (ref 3.5–5.1)
SODIUM: 137 mmol/L (ref 135–145)

## 2018-05-06 LAB — GLUCOSE, CAPILLARY
GLUCOSE-CAPILLARY: 130 mg/dL — AB (ref 65–99)
GLUCOSE-CAPILLARY: 117 mg/dL — AB (ref 65–99)
GLUCOSE-CAPILLARY: 123 mg/dL — AB (ref 65–99)
Glucose-Capillary: 166 mg/dL — ABNORMAL HIGH (ref 65–99)

## 2018-05-06 LAB — HEPARIN LEVEL (UNFRACTIONATED): HEPARIN UNFRACTIONATED: 0.44 [IU]/mL (ref 0.30–0.70)

## 2018-05-06 MED ORDER — LOSARTAN POTASSIUM 25 MG PO TABS
25.0000 mg | ORAL_TABLET | Freq: Every day | ORAL | Status: DC
  Administered 2018-05-06: 25 mg via ORAL
  Filled 2018-05-06 (×2): qty 1

## 2018-05-06 MED ORDER — CLONIDINE HCL 0.1 MG PO TABS
0.0500 mg | ORAL_TABLET | Freq: Every day | ORAL | Status: AC
  Administered 2018-05-06 – 2018-05-07 (×2): 0.05 mg via ORAL
  Filled 2018-05-06: qty 1

## 2018-05-06 MED ORDER — CLONIDINE HCL 0.1 MG PO TABS: 0.1000 mg | ORAL_TABLET | Freq: Every day | ORAL | Status: DC

## 2018-05-06 MED ORDER — NEBIVOLOL HCL 5 MG PO TABS: 10.0000 mg | ORAL_TABLET | Freq: Every day | ORAL | Status: DC

## 2018-05-06 MED ORDER — NEBIVOLOL HCL 5 MG PO TABS
5.0000 mg | ORAL_TABLET | Freq: Every day | ORAL | Status: DC
  Administered 2018-05-06 – 2018-05-10 (×5): 5 mg via ORAL
  Filled 2018-05-06 (×4): qty 1

## 2018-05-06 NOTE — Progress Notes (Signed)
Progress Note  Patient Name: Alejandro [name] Date of Encounter: 05/06/2018  Primary Cardiologist: Rozann Lesches, MD   Subjective   Feeling well.  No chest pain or shortness of breath.    Inpatient Medications    Scheduled Meds: . albuterol  5 mg Nebulization Once  . aspirin  81 mg Oral Daily  . atorvastatin  80 mg Oral q1800  . cloNIDine  0.1 mg Oral Daily  . doxycycline  100 mg Oral Q12H  . furosemide  40 mg Oral BID  . insulin aspart  0-15 Units Subcutaneous TID WC  . insulin aspart  0-5 Units Subcutaneous QHS  . isosorbide-hydrALAZINE  1 tablet Oral BID  . losartan  25 mg Oral Daily  . [START ON 05/07/2018] nebivolol  5 mg Oral Daily  . pantoprazole  40 mg Oral Daily  . potassium chloride  20 mEq Oral BID  . sodium chloride flush  3 mL Intravenous Q12H   Continuous Infusions: . sodium chloride    . albuterol 10 mg/hr (05/02/18 1837)  . heparin 1,800 Units/hr (05/06/18 0300)   PRN Meds: sodium chloride, acetaminophen, ALPRAZolam, ondansetron (ZOFRAN) IV, oxyCODONE, sodium chloride flush   Vital Signs    Vitals:   05/05/18 2030 05/05/18 2300 05/06/18 0300 05/06/18 0700  BP: (!) 148/89 118/65 (!) 146/78 (!) 155/83  Pulse:  (!) 55 65 69  Resp: (!) 25 18 16  (!) 23  Temp:  97.7 F (36.5 C) 98 F (36.7 C) 97.8 F (36.6 C)  TempSrc:  Oral Oral Oral  SpO2:  100% 98% 95%  Weight:   242 lb 1 oz (109.8 kg)   Height:   5\' 11"  (1.803 m)     Intake/Output Summary (Last 24 hours) at 05/06/2018 1036 Last data filed at 05/06/2018 1015 Gross per 24 hour  Intake 2103.7 ml  Output 1000 ml  Net 1103.7 ml   Filed Weights   05/04/18 0343 05/05/18 0300 05/06/18 0300  Weight: 251 lb 5.2 oz (114 kg) 243 lb 9.7 oz (110.5 kg) 242 lb 1 oz (109.8 kg)    Telemetry    Sinus rhythm, sinus bradycardia.  No events.  - Personally Reviewed  ECG    Sinus tachycardia.  Rate 111 bpm.  Frequent PVCs.  Inferolateral ST depression.  - Personally Reviewed  Physical Exam   VS:  BP (!)  155/83 (BP Location: Right Arm)   Pulse 69   Temp 97.8 F (36.6 C) (Oral)   Resp (!) 23   Ht 5\' 11"  (1.803 m)   Wt 242 lb 1 oz (109.8 kg)   SpO2 95%   BMI 33.76 kg/m  , BMI Body mass index is 33.76 kg/m. GENERAL:  Well appearing HEENT: Pupils equal round and reactive, fundi not visualized, oral mucosa unremarkable NECK:  No jugular venous distention, waveform within normal limits, carotid upstroke brisk and symmetric, no bruits LUNGS:  Clear to auscultation bilaterally HEART:  RRR.  PMI not displaced or sustained,S1 and S2 within normal limits, no S3, no S4, no clicks, no rubs, II/VI systolic murmur at the LUSB ABD:  Flat, positive bowel sounds normal in frequency in pitch, no bruits, no rebound, no guarding, no midline pulsatile mass, no hepatomegaly, no splenomegaly EXT:  2 plus pulses throughout, no edema, no cyanosis no clubbing SKIN:  No rashes no nodules NEURO:  Cranial nerves II through XII grossly intact, motor grossly intact throughout PSYCH:  Cognitively intact, oriented to person place and time   Labs  Chemistry Recent Labs  Lab 05/02/18 1825  05/04/18 0246 05/05/18 0956 05/06/18 0242  NA 138   < > 137 135 137  K 3.3*   < > 4.4 4.1 4.3  CL 103   < > 102 103 101  CO2 16*   < > 26 23 27   GLUCOSE 297*   < > 151* 228* 148*  BUN 20   < > 22* 18 17  CREATININE 1.51*   < > 1.21 1.23 1.29*  CALCIUM 8.6*   < > 8.4* 8.8* 9.1  PROT 7.3  --   --   --   --   ALBUMIN 3.8  --   --   --   --   AST 25  --   --   --   --   ALT 16*  --   --   --   --   ALKPHOS 62  --   --   --   --   BILITOT 0.6  --   --   --   --   GFRNONAA 43*   < > 57* 56* 53*  GFRAA 50*   < > >60 >60 >60  ANIONGAP 19*   < > 9 9 9    < > = values in this interval not displayed.     Hematology Recent Labs  Lab 05/02/18 1825 05/03/18 0444 05/06/18 0242  WBC 19.6* 10.4 10.4  RBC 4.85 4.69 4.87  HGB 14.3 13.5 14.0  HCT 44.8 41.8 42.5  MCV 92.4 89.1 87.3  MCH 29.5 28.8 28.7  MCHC 31.9 32.3 32.9    RDW 16.0* 16.1* 15.2  PLT 325 263 266    Cardiac Enzymes Recent Labs  Lab 05/02/18 1825 05/02/18 2302 05/03/18 0444 05/03/18 1014  TROPONINI 0.03* 0.41* 1.04* 1.19*   No results for input(s): TROPIPOC in the last 168 hours.   BNP Recent Labs  Lab 05/02/18 1825  BNP 632.0*     DDimer  Recent Labs  Lab 05/02/18 1825  DDIMER 1.13*     Radiology    Ct Coronary Morph W/cta Cor W/score W/ca W/cm &/or Wo/cm  Addendum Date: 05/06/2018   ADDENDUM REPORT: 05/06/2018 08:02 EXAM: OVER-READ INTERPRETATION  CT CHEST The following report is an over-read performed by radiologist Dr. Collene Leyden [facility] on 05/06/2018. This over-read does not include interpretation of cardiac or coronary anatomy or pathology. The coronary CTA interpretation by the cardiologist is attached. COMPARISON:  05/02/2018 FINDINGS: Mild cardiomegaly. Visualized aorta is normal caliber. Scattered calcifications in the visualized descending thoracic aorta. Scattered borderline sized mediastinal lymph nodes are stable since prior study, likely reactive. No confluent airspace opacities or effusions. No acute abnormality in the upper abdomen. Chest wall soft tissues are unremarkable. No acute bony abnormality. IMPRESSION: Mild cardiomegaly. Shotty mediastinal lymph nodes are stable, likely reactive. No acute findings. Electronically Signed   By: Rolm Baptise M.D.   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 CTA to exclude course between pulmonary artery and aorta as well as slit-like orifice/intramural course. EXAM: Cardiac/Coronary  CT TECHNIQUE: The patient was scanned on a [employer]. FINDINGS: A 120 kV prospective scan was triggered in the descending thoracic aorta at 111 HU's. Axial  non-contrast 3 mm slices were carried out through the  heart. The data set was analyzed on a dedicated work station and scored using the [place]. Gantry rotation speed was 250 msecs and collimation was .6 mm. No beta blockade and 0.8 mg of sl NTG was given. The 3D data set was reconstructed in 5% intervals of the 67-82 % of the R-R cycle. Diastolic phases were analyzed on a dedicated work station using MPR, MIP and VRT modes. The patient received 80 cc of contrast. Aorta:  Normal size.  Mild diffuse calcifications.  No dissection. Aortic Valve:  Trileaflet.  Thickened with moderate calcifications. Coronary Arteries:  Normal coronary origin.  Right dominance. RCA is a large dominant artery that gives rise to PDA and PLVB. There severe diffuse mixed plaque with > 70% stenosis in the distal segment. PDA has severe diffuse plaque with > 70% stenosis. Adjacent to RCA origin is a high RV marginal origin. This RV marginal branch is medium size artery and has minimal plaque. Left main has a very high origin just above the left coronary sinus, then continues in the normal course and gives rise to LAD and LCX arteries. There is minimal plaque. LAD is a medium size vessel that has severe diffuse predominantly calcified plaque. There is > 70% stenosis in its mid segment. LCX is a non-dominant artery that gives rise to one small OM1 branch. There is > 70% stenosis in the mid LCx artery post OM1 takeoff. OM1 is small. Other findings: Normal pulmonary vein drainage into the left atrium. Normal let atrial appendage without a thrombus. Normal size of the pulmonary artery. IMPRESSION: 1. Coronary calcium score of 3133. This was 47 percentile for age and sex matched control. 2. Normal coronary origin with left main originating above the left coronary sinus with normal course and no interarterial course. Right coronary sinus gives rise to RCA and a medium size high RV marginal branch (considered LAD on cardiac catheterization).  3. Severe three vessel CAD with diffuse plaque and > 70% in all three coronary arteries, surgical consultation is recommended. Electronically Signed: By: Ena Dawley On: 05/05/2018 16:55    Cardiac Studies   Echo 05/03/18: Study Conclusions  - Left ventricle: The cavity size was moderately dilated. Wall   thickness was increased in a pattern of mild LVH. Systolic   function was normal. The estimated ejection fraction was in the   range of 50% to 55%. There is mild hypokinesis of the   basal-midinferior myocardium. Features are consistent with a   pseudonormal left ventricular filling pattern, with concomitant   abnormal relaxation and increased filling pressure (grade 2   diastolic dysfunction). - Aortic valve: Mildly to moderately calcified annulus. Trileaflet;   moderately calcified leaflets. Noncoronary cusp mobility was   restricted. There was mild stenosis. There was trivial   regurgitation. Mean gradient (S): 22 mm Hg. Peak gradient (S): 39   mm Hg. VTI ratio of LVOT to aortic valve: 0.42. Valve area (VTI):   2.08 cm^2. Valve area (Vmax): 1.9 cm^2. Valve area (Vmean): 1.86   cm^2. - Mitral valve: Moderately calcified annulus. There was mild   regurgitation. - Left atrium: The atrium was moderately dilated. - Right atrium: Central venous pressure (est): 8 mm Hg. - Atrial septum: No defect or patent foramen ovale was identified. - Tricuspid valve: There was trivial regurgitation. - Pulmonary arteries: Systolic pressure was moderately increased.   PA peak pressure: 50 mm Hg (S). - Coronary sinus: The vessel was dilated. - Pericardium, extracardiac: There was no pericardial effusion.  LHC 05/04/18:   Technically difficult procedure to complete due to subclavian and innominate artery tortuosity.  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  The vessel contains diffuse luminal  irregularities from the proximal to the distal third of the vessel.  No focal high-grade obstruction is noted.  Need to exclude course between pulmonary artery and aorta as well as slitlike orifice/intramural course.  First diagonal, which arises from the left main, contains calcified segmental 95% stenosis beyond the septal perforator.  There is also a large septal perforator that arises from the diagonal.  Circumflex coronary artery gives origin to a trifurcating obtuse marginal #1 with the first branch of the trifurcation containing 95 to 99% stenosis.  This branch is small to moderate in size.  Continuation of the circumflex beyond the first obtuse marginal contains 70% stenosis before ending on a very tiny second obtuse marginal branch.  The left main is widely patent.  The right coronary artery is dominant and contains 50 to 60% distal stenosis, PDA contains 75% ostial stenosis, first LV branch contains segmental 80% stenosis, and large second left ventricular branch contains segmental 60 to 70% proximal narrowing.  Overall LV function could not be assessed by the study.  EDP is markedly elevated at 26 mmHg suggesting an acute on chronic diastolic heart failure process given echo EF of 55%.  RECOMMENDATIONS:   Coronary CT angio to evaluate the course and ostium of the LAD.  If there are high risk features, should consider surgical consult and heart team approach concerning revascularization options. The AAOCA-LAD supplies the mid and distal third of the anterior wall.  If LAD is not potentially ischemia inducing, diagonal could be intervened upon.  The branch of the obtuse marginal would be more difficult.  For the time being, medical management of heart failure, monitor kidney function, risk factor modification, and and decision concerning revascularization after CT.   Coronary CT-A 05/05/18:  IMPRESSION: 1. Coronary calcium score of 3133. This was 1 percentile for age and sex  matched control.  2. Normal coronary origin with left main originating above the left coronary sinus with normal course and no interarterial course. Right coronary sinus gives rise to RCA and a medium size high RV marginal branch (considered LAD on cardiac catheterization).  3. Severe three vessel CAD with diffuse plaque and > 70% in all three coronary arteries, surgical consultation is recommended.  Patient Profile     77 y.o. male with obstructive CAD, anomalous LAD, hypertension, hyperlipidemia, diabetes, and mild aortic stenosis here with NSTEMI and acute diastolic heart failure.    Assessment & Plan    # NSTEMI:  # Anomalous coronary: Troponin mildly elevated to 1.19.  He has not had any chest pain or pressure but has had progressive exertional dyspnea.  Echo revealed focal wall motion abnormalities in the basal inferior myocardium.  EKG also had ST depressions.  He  underwent left heart catheterization 05/04/2018 and there was concern for an anomalous LAD arising from the right sinus of Valsalva superior to the origin of the RCA.  No high-grade obstruction was noted in this vessel, but it was unclear if the course went between the pulmonary artery and the aorta.  He had a coronary CT-A that revealed a normal coronary origin with the left main originating above the left coronary sinus.  There is no intra-arterial course.  He has severe three-vessel disease with diffuse  greater than 70% stenosis.  Surgery has been contacted and will see him today.  Continue aspirin, atorvastatin, heparin, and nebivolol.  # Moderate aortic stenosis: Mean gradient 22 mmHg on echo this admission.  # Acute diastolic heart failure: # Hypertension:  Feeling better with diuresis.  LVEDP was 26 mmHg on cath.  He is -4 L this admission.  By ends and outs he was reportedly +1 L yesterday.  However his weight continues to go down to 242 pounds and he appears euvolemic.  Continue Lasix 40 mg twice daily.  Blood  pressure was poorly controlled.  We cannot increase his nebivolol due to bradycardia.  We will reduce clonidine to 0.05 mg twice daily with plans to stop it.  Start losartan 25 mg daily.  Continue Bidil.   # Hyperlipidemia:  Atorvastatin started this admission.  Check lipids/CMP in 6-8 weeks.     For questions or updates, please contact [facility] Please consult [email or web address] for contact info under Cardiology/STEMI.      Signed, Skeet Latch, MD  05/06/2018, 10:36 AM

## 2018-05-06 NOTE — Consult Note (Addendum)
[name]  Medical Record #[id number] Date of Birth: 07-26-1941  Referring: [place] Primary Care: Celene Squibb, MD Primary Cardiologist:Samuel [name], MD  Chief Complaint:    Chief Complaint  Patient presents with  . Shortness of Breath   History of Present Illness:       Alejandro [name] is a 77 yo obese white male with known history of Hypertension, diabetes, CKD, IBS, and Lyme Disease.  The patient developed acute onset shortness of breath.  It started 3 days prior to admission but became acutely worse on day of presentation.  The patient was walking back to his truck when he became very short of breath, exhausted, and felt lousy overall.  He was transported to the ED for evaluation.  On evaluation he denied orthopnea, wheezing, and fevers.  Workup in the ED showed a mildly elevated Troponin level.  EKG was unremarkable.  CT scan of the chest showed pleural effusions, but no evidence of PE.  He was treated with IV lasix in the ED with 1.8L of fluid output.  He was ruled in for NSTEMI and transported to [facility] for further care.  He continued to get diuretics for acute diastolic CHF.  He underwent cardiac catheterization which showed multivessel CAD.  It was felt coronary bypass grafting would be indicated and TCTS consult was requested.  The patient is currently chest pain free.  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  He thought his shortness of breath was related to his new diagnosis of lyme disease.  He is agreeable to having surgery, but he would like to go home prior to surgery.  Current Activity/ Functional Status: Patient is independent with mobility/ambulation, transfers, ADL's, IADL's.   Zubrod Score: At the time of surgery this patient's most appropriate activity status/level should be described as: []     0    Normal activity, no  symptoms [x]     1    Restricted in physical strenuous activity but ambulatory, able to do out light work []     2    Ambulatory and capable of self care, unable to do work activities, up and about                 more than 50%  Of the time                            []     3    Only limited self care, in bed greater than 50% of waking hours []     4    Completely disabled, no self care, confined to bed or chair []     5    Moribund  Past Medical History:  Diagnosis Date  . History of nephrolithiasis 2004  . Hyperlipidemia   . Hypertension   . IBS (irritable colon syndrome)   . Reflux   . Type 2 diabetes mellitus ([place])     Past Surgical History:  Procedure Laterality Date  . ESOPHAGOGASTRODUODENOSCOPY  02/04/2012   Procedure: ESOPHAGOGASTRODUODENOSCOPY (EGD);  Surgeon: Rogene Houston, MD;  Location: [facility];  Service: Endoscopy;  Laterality: N/A;  100  . LEFT HEART CATH AND CORONARY ANGIOGRAPHY N/A 05/04/2018   Procedure: LEFT HEART  CATH AND CORONARY ANGIOGRAPHY;  Surgeon: Belva Crome, MD;  Location: [facility];  Service: Cardiovascular;  Laterality: N/A;  . NASAL FRACTURE SURGERY      Social History   Tobacco Use  Smoking Status Never Smoker  Smokeless Tobacco Never Used    Social History   Substance and Sexual Activity  Alcohol Use No  . Alcohol/week: 0.0 oz     No Known Allergies  Current Facility-Administered Medications  Medication Dose Route Frequency Provider Last Rate Last Dose  . 0.9 %  sodium chloride infusion  250 mL Intravenous PRN Belva Crome, MD      . acetaminophen (TYLENOL) tablet 650 mg  650 mg Oral Q4H PRN Belva Crome, MD   650 mg at 05/06/18 1023  . albuterol (PROVENTIL) (2.5 MG/3ML) 0.083% nebulizer solution 5 mg  5 mg Nebulization Once Tanna Furry, MD      . albuterol (PROVENTIL,VENTOLIN) solution continuous neb  10 mg/hr Nebulization Continuous Tanna Furry, MD 2 mL/hr at 05/02/18 1837 10 mg/hr at 05/02/18 1837  . ALPRAZolam Duanne Moron)  tablet 0.5 mg  0.5 mg Oral BID PRN Skeet Latch, MD   0.5 mg at 05/05/18 1901  . aspirin chewable tablet 81 mg  81 mg Oral Daily Belva Crome, MD   81 mg at 05/06/18 1000  . atorvastatin (LIPITOR) tablet 80 mg  80 mg Oral q1800 Satira Sark, MD   80 mg at 05/05/18 1755  . cloNIDine (CATAPRES) tablet 0.05 mg  0.05 mg Oral Daily Skeet Latch, MD   0.05 mg at 05/06/18 1109  . doxycycline (VIBRA-TABS) tablet 100 mg  100 mg Oral Q12H Barton Dubois, MD   100 mg at 05/06/18 1001  . furosemide (LASIX) tablet 40 mg  40 mg Oral BID Truett Mainland, DO   40 mg at 05/06/18 7253  . heparin ADULT infusion 100 units/mL (25000 units/2100mL sodium chloride 0.45%)  1,800 Units/hr Intravenous Continuous Lyndee Leo, RPH 18 mL/hr at 05/06/18 0300 1,800 Units/hr at 05/06/18 0300  . insulin aspart (novoLOG) injection 0-15 Units  0-15 Units Subcutaneous TID WC Truett Mainland, DO   2 Units at 05/06/18 1322  . insulin aspart (novoLOG) injection 0-5 Units  0-5 Units Subcutaneous QHS Truett Mainland, DO   2 Units at 05/02/18 2309  . isosorbide-hydrALAZINE (BIDIL) 20-37.5 MG per tablet 1 tablet  1 tablet Oral BID Barton Dubois, MD   1 tablet at 05/06/18 1000  . losartan (COZAAR) tablet 25 mg  25 mg Oral Daily Velna Ochs, MD   25 mg at 05/06/18 1109  . [START ON 05/07/2018] nebivolol (BYSTOLIC) tablet 5 mg  5 mg Oral Daily Velna Ochs, MD   5 mg at 05/06/18 1020  . ondansetron (ZOFRAN) injection 4 mg  4 mg Intravenous Q6H PRN Truett Mainland, DO      . oxyCODONE (Oxy IR/ROXICODONE) immediate release tablet 5-10 mg  5-10 mg Oral Q4H PRN Belva Crome, MD      . pantoprazole (PROTONIX) EC tablet 40 mg  40 mg Oral Daily Truett Mainland, DO   40 mg at 05/06/18 1001  . potassium chloride SA (K-DUR,KLOR-CON) CR tablet 20 mEq  20 mEq Oral BID Truett Mainland, DO   20 mEq at 05/06/18 1001  . sodium chloride flush (NS) 0.9 % injection 3 mL  3 mL Intravenous Q12H Belva Crome, MD   3 mL at  05/06/18 0830  . sodium chloride flush (NS)  0.9 % injection 3 mL  3 mL Intravenous PRN Belva Crome, MD        Medications Prior to Admission  Medication Sig Dispense Refill Last Dose  . ALPRAZolam (XANAX) 0.5 MG tablet Take 0.5 mg by mouth at bedtime as needed for sleep.   05/01/2018 at Unknown time  . aspirin EC 81 MG tablet Take 81 mg by mouth daily.   05/02/2018 at Unknown time  . azithromycin (ZITHROMAX) 250 MG tablet Take 250 mg by mouth daily. Take 2 tablets on 1st day and 1 tablet daily until finished   05/02/2018 at Unknown time  . cloNIDine (CATAPRES) 0.1 MG tablet Take 0.1 mg by mouth 2 (two) times daily.    05/02/2018 at Unknown time  . DEXILANT 60 MG capsule TAKE ONE CAPSULE BY MOUTH EVERY DAY. 30 capsule 5 05/02/2018 at Unknown time  . hydrALAZINE (APRESOLINE) 25 MG tablet Take 25 mg by mouth daily.   05/02/2018 at Unknown time  . nebivolol (BYSTOLIC) 5 MG tablet Take 5 mg by mouth daily.    05/02/2018 at Unknown time  . sitaGLIPtan-metformin (JANUMET) 50-1000 MG per tablet Take 1 tablet by mouth 2 (two) times daily with a meal.     05/02/2018 at Unknown time    Family History  Problem Relation Age of Onset  . Diabetes Mellitus II Mother   . Heart attack Father   . Heart attack Brother   . Heart disease Unknown   . Arthritis Unknown   . Asthma Unknown   . Diabetes Unknown    Review of Systems:   ROS pertinent items are below     Cardiac Review of Systems: Y or  [    ]= no  Chest Pain [ N   ]  Resting SOB [   ] Exertional SOB  [Y ]  Orthopnea [  ]   Pedal Edema [   ]    Palpitations [  ] Syncope  [  ]   Presyncope [   ]  General Review of Systems: [Y] = yes [  ]=no Constitional: recent weight change [  ]; anorexia [  ]; fatigue [ Y ]; nausea [  ]; night sweats [  ]; fever [  ]; or chills [  ]                                                               Dental: Last Dentist visit:   Eye : blurred vision [  ]; diplopia [   ]; vision changes [  ];  Amaurosis fugax[  ]; Resp: cough  [  ];  wheezing[  ];  hemoptysis[  ]; shortness of breath[Y  ]; paroxysmal nocturnal dyspnea[  ]; dyspnea on exertion[ Y ]; or orthopnea[  ];  GI:  gallstones[  ], vomiting[  ];  dysphagia[  ]; melena[  ];  hematochezia [  ]; heartburn[  ];   Hx of  Colonoscopy[  ]; GU: kidney stones [ Y ]; hematuria[  ];   dysuria [  ];  nocturia[  ];  history of     obstruction [  ]; urinary frequency [  ]             Skin: rash, swelling[  ];, hair  loss[  ];  peripheral edema[  ];  or itching[  ]; varicose veins/spider veins BLE Musculosketetal: myalgias[  ];  joint swelling[  ];  joint erythema[  ];  joint pain[  ];  back pain[  ];  Heme/Lymph: bruising[  ];  bleeding[  ];  anemia[  ];  Neuro: TIA[  ];  headaches[  ];  stroke[  ];  vertigo[  ];  seizures[  ];   paresthesias[  ];  difficulty walking[  ];  Psych:depression[  ]; anxiety[  ];  Endocrine: diabetes[Y  ];  thyroid dysfunction[  ];   Physical Exam: BP 123/78 (BP Location: Right Arm)   Pulse 80   Temp 98 F (36.7 C) (Oral)   Resp (!) 24   Ht 5\' 11"  (1.803 m)   Wt 242 lb 1 oz (109.8 kg)   SpO2 95%   BMI 33.76 kg/m    General appearance: alert and cooperative Head: Normocephalic, without obvious abnormality, atraumatic Neck: no adenopathy, no carotid bruit, no JVD, supple, symmetrical, trachea midline and thyroid not enlarged, symmetric, no tenderness/mass/nodules Resp: clear to auscultation bilaterally Cardio: regular rate and rhythm GI: soft, non-tender; bowel sounds normal; no masses,  no organomegaly Extremities: extremities normal, atraumatic, no cyanosis or edema and varicose veins noted Neurologic: Grossly normal  Diagnostic Studies & Laboratory data:     Recent Radiology Findings:   Ct Coronary Morph W/cta Cor W/score W/ca W/cm &/or Wo/cm  Addendum Date: 05/06/2018   ADDENDUM REPORT: 05/06/2018 08:02 EXAM: OVER-READ INTERPRETATION  CT CHEST The following report is an over-read performed by radiologist Dr. Collene Leyden  [facility] on 05/06/2018. This over-read does not include interpretation of cardiac or coronary anatomy or pathology. The coronary CTA interpretation by the cardiologist is attached. COMPARISON:  05/02/2018 FINDINGS: Mild cardiomegaly. Visualized aorta is normal caliber. Scattered calcifications in the visualized descending thoracic aorta. Scattered borderline sized mediastinal lymph nodes are stable since prior study, likely reactive. No confluent airspace opacities or effusions. No acute abnormality in the upper abdomen. Chest wall soft tissues are unremarkable. No acute bony abnormality. IMPRESSION: Mild cardiomegaly. Shotty mediastinal lymph nodes are stable, likely reactive. No acute findings. Electronically Signed   By: Rolm Baptise M.D.   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 CTA to exclude course between pulmonary artery and aorta as well as slit-like orifice/intramural course. EXAM: Cardiac/Coronary  CT TECHNIQUE: The patient was scanned on a [employer]. FINDINGS: A 120 kV prospective scan was triggered in the descending thoracic aorta at 111 HU's. Axial non-contrast 3 mm slices were carried out through the heart. The data set was analyzed on a dedicated work station and scored using the [place]. Gantry rotation speed was 250 msecs and collimation was .6 mm. No beta blockade and 0.8 mg of sl NTG was given. The 3D data set was reconstructed in 5% intervals of the 67-82 % of the R-R cycle. Diastolic phases were analyzed on a dedicated work station using MPR, MIP and VRT modes. The patient received 80 cc of contrast. Aorta:  Normal size.  Mild diffuse calcifications.  No dissection. Aortic Valve:  Trileaflet.  Thickened with moderate calcifications. Coronary  Arteries:  Normal coronary origin.  Right dominance. RCA is a large dominant artery that gives rise to PDA  and PLVB. There severe diffuse mixed plaque with > 70% stenosis in the distal segment. PDA has severe diffuse plaque with > 70% stenosis. Adjacent to RCA origin is a high RV marginal origin. This RV marginal branch is medium size artery and has minimal plaque. Left main has a very high origin just above the left coronary sinus, then continues in the normal course and gives rise to LAD and LCX arteries. There is minimal plaque. LAD is a medium size vessel that has severe diffuse predominantly calcified plaque. There is > 70% stenosis in its mid segment. LCX is a non-dominant artery that gives rise to one small OM1 branch. There is > 70% stenosis in the mid LCx artery post OM1 takeoff. OM1 is small. Other findings: Normal pulmonary vein drainage into the left atrium. Normal let atrial appendage without a thrombus. Normal size of the pulmonary artery. IMPRESSION: 1. Coronary calcium score of 3133. This was 50 percentile for age and sex matched control. 2. Normal coronary origin with left main originating above the left coronary sinus with normal course and no interarterial course. Right coronary sinus gives rise to RCA and a medium size high RV marginal branch (considered LAD on cardiac catheterization). 3. Severe three vessel CAD with diffuse plaque and > 70% in all three coronary arteries, surgical consultation is recommended. Electronically Signed: By: Ena Dawley On: 05/05/2018 16:55     I have independently reviewed the above radiologic studies and discussed with the patient   Recent Lab Findings: Lab Results  Component Value Date   WBC 10.4 05/06/2018   HGB 14.0 05/06/2018   HCT 42.5 05/06/2018   PLT 266 05/06/2018   GLUCOSE 148 (H) 05/06/2018   ALT 16 (L) 05/02/2018   AST 25 05/02/2018   NA 137 05/06/2018   K 4.3 05/06/2018   CL 101 05/06/2018   CREATININE 1.29 (H) 05/06/2018   BUN  17 05/06/2018   CO2 27 05/06/2018   INR 1.06 05/04/2018   HGBA1C 6.0 (H) 05/03/2018    Assessment / Plan:      1. CAD- needs CABG 2. HTN- continue current antihypertensive agents 3. Hyperlipidemia- continue statin 4. DM- well controlled, A1c is 6.0 5. Dispo- patient stable, chest pain free, for coronary bypass.. Patient wishes to be discharged prior to surgery.. Will defer to Cardiology, Dr. Prescott Gum to follow up with further recommendations  I  spent 55 minutes counseling the patient face to face.   Junie Panning Barrett PA-C 05/06/2018 1:29 PM  Patient examined and cath, echocardiogram images personally reviewed 77 yo obese non smoker diabetic admitted with NONSTEMI and CHF Cath shows EF .35 with 3 v CAD. LVEDP 24 No valve disease on Echo He has had 3 iv contrast loads with cath, cardiac CT and CTA R/O PE He has varicose veins in lower legs but prob adequqte conduit in thighs The Diag., OM and distal RCA are adequate targets,  Plan CABG next avail OR date- Tuesday 6-11 Cont with diuresis and monitor renal fx  patient examined and medical record reviewed,agree with above note. Tharon Aquas Trigt III 05/07/2018

## 2018-05-06 NOTE — Progress Notes (Signed)
ANTICOAGULATION CONSULT NOTE - Follow Up Consult  Pharmacy Consult for heparin Indication: CAD awaiting surgical consult  Labs: Recent Labs    05/04/18 0246  05/05/18 0956 05/05/18 1956 05/06/18 0242  HGB  --   --   --   --  14.0  HCT  --   --   --   --  42.5  PLT  --   --   --   --  266  LABPROT 13.7  --   --   --   --   INR 1.06  --   --   --   --   HEPARINUNFRC  --    < > 0.18* 0.34 0.44  CREATININE 1.21  --  1.23  --  1.29*   < > = values in this interval not displayed.    Assessment: 77 yo male s/p cath. Difficult procedure, no intervention performed. Heparin restarted post cath, pending decision of PCI vs surgery.   Heparin level is therapeutic (HL= 0.44) on 1800 units/hr.  Goal of Therapy:  Heparin level 0.3-0.7 units/ml   Plan:  Continue heparin at 1800 units/hr. Next heparin level with AM labs.  Hildred Laser, PharmD Clinical Pharmacist Clinical phone from 8:30-4:00 is [phone] After 4pm, please call Main Rx [phone]) for assistance. 05/06/2018 10:33 AM

## 2018-05-06 NOTE — Care Management Important Message (Signed)
Important Message  Patient Details  Name: Alejandro [name] MRN: [id number] Date of Birth: Feb 09, 1941   Medicare Important Message Given:  Yes    Jeimy Bickert P [place] 05/06/2018, 3:13 PM

## 2018-05-07 ENCOUNTER — Encounter (HOSPITAL_COMMUNITY): Payer: Self-pay | Admitting: Physician Assistant

## 2018-05-07 ENCOUNTER — Inpatient Hospital Stay (HOSPITAL_COMMUNITY): Payer: Medicare Other

## 2018-05-07 DIAGNOSIS — Z01810 Encounter for preprocedural cardiovascular examination: Secondary | ICD-10-CM

## 2018-05-07 DIAGNOSIS — I350 Nonrheumatic aortic (valve) stenosis: Secondary | ICD-10-CM

## 2018-05-07 DIAGNOSIS — N183 Chronic kidney disease, stage 3 unspecified: Secondary | ICD-10-CM

## 2018-05-07 DIAGNOSIS — E669 Obesity, unspecified: Secondary | ICD-10-CM

## 2018-05-07 DIAGNOSIS — E1159 Type 2 diabetes mellitus with other circulatory complications: Secondary | ICD-10-CM

## 2018-05-07 DIAGNOSIS — I2510 Atherosclerotic heart disease of native coronary artery without angina pectoris: Secondary | ICD-10-CM

## 2018-05-07 DIAGNOSIS — I5031 Acute diastolic (congestive) heart failure: Secondary | ICD-10-CM | POA: Insufficient documentation

## 2018-05-07 DIAGNOSIS — I214 Non-ST elevation (NSTEMI) myocardial infarction: Secondary | ICD-10-CM

## 2018-05-07 DIAGNOSIS — Z794 Long term (current) use of insulin: Secondary | ICD-10-CM

## 2018-05-07 LAB — PULMONARY FUNCTION TEST
DL/VA % pred: 86 %
DL/VA: 4 ml/min/mmHg/L
DLCO unc % pred: 67 %
DLCO unc: 22.78 ml/min/mmHg
FEF 25-75 Post: 2.76 L/sec
FEF 25-75 Pre: 3.69 L/sec
FEF2575-%Change-Post: -25 %
FEF2575-%Pred-Post: 119 %
FEF2575-%Pred-Pre: 159 %
FEV1-%Change-Post: -5 %
FEV1-%Pred-Post: 80 %
FEV1-%Pred-Pre: 85 %
FEV1-Post: 2.58 L
FEV1-Pre: 2.72 L
FEV1FVC-%Change-Post: 2 %
FEV1FVC-%Pred-Pre: 116 %
FEV6-%Change-Post: -7 %
FEV6-%Pred-Post: 71 %
FEV6-%Pred-Pre: 77 %
FEV6-Post: 2.95 L
FEV6-Pre: 3.2 L
FEV6FVC-%Pred-Post: 106 %
FEV6FVC-%Pred-Pre: 106 %
FVC-%Change-Post: -7 %
FVC-%Pred-Post: 67 %
FVC-%Pred-Pre: 72 %
FVC-Post: 2.95 L
FVC-Pre: 3.2 L
Post FEV1/FVC ratio: 87 %
Post FEV6/FVC ratio: 100 %
Pre FEV1/FVC ratio: 85 %
Pre FEV6/FVC Ratio: 100 %
RV % pred: 60 %
RV: 1.58 L
TLC % pred: 62 %
TLC: 4.56 L

## 2018-05-07 LAB — CBC
HCT: 44.1 % (ref 39.0–52.0)
Hemoglobin: 14.6 g/dL (ref 13.0–17.0)
MCH: 29 pg (ref 26.0–34.0)
MCHC: 33.1 g/dL (ref 30.0–36.0)
MCV: 87.5 fL (ref 78.0–100.0)
PLATELETS: 315 10*3/uL (ref 150–400)
RBC: 5.04 MIL/uL (ref 4.22–5.81)
RDW: 15.3 % (ref 11.5–15.5)
WBC: 11.6 10*3/uL — ABNORMAL HIGH (ref 4.0–10.5)

## 2018-05-07 LAB — BASIC METABOLIC PANEL
ANION GAP: 9 (ref 5–15)
BUN: 20 mg/dL (ref 6–20)
CO2: 23 mmol/L (ref 22–32)
Calcium: 9 mg/dL (ref 8.9–10.3)
Chloride: 103 mmol/L (ref 101–111)
Creatinine, Ser: 1.28 mg/dL — ABNORMAL HIGH (ref 0.61–1.24)
GFR calc Af Amer: >60 mL/min (ref >60)
GFR calc non Af Amer: 53 mL/min — ABNORMAL LOW (ref >60)
GLUCOSE: 160 mg/dL — AB (ref 65–99)
Potassium: 4 mmol/L (ref 3.5–5.1)
Sodium: 135 mmol/L (ref 135–145)

## 2018-05-07 LAB — GLUCOSE, CAPILLARY
GLUCOSE-CAPILLARY: 155 mg/dL — AB (ref 65–99)
GLUCOSE-CAPILLARY: 133 mg/dL — AB (ref 65–99)
GLUCOSE-CAPILLARY: 140 mg/dL — AB (ref 65–99)
Glucose-Capillary: 157 mg/dL — ABNORMAL HIGH (ref 65–99)

## 2018-05-07 LAB — HEPARIN LEVEL (UNFRACTIONATED): Heparin Unfractionated: 0.47 IU/mL (ref 0.30–0.70)

## 2018-05-07 MED ORDER — LOSARTAN POTASSIUM 50 MG PO TABS
50.0000 mg | ORAL_TABLET | Freq: Every day | ORAL | Status: DC
  Administered 2018-05-07 – 2018-05-10 (×4): 50 mg via ORAL
  Filled 2018-05-07 (×4): qty 1

## 2018-05-07 MED ORDER — DOXYCYCLINE HYCLATE 100 MG PO TABS
100.0000 mg | ORAL_TABLET | Freq: Two times a day (BID) | ORAL | Status: DC
  Administered 2018-05-07 – 2018-05-10 (×8): 100 mg via ORAL
  Filled 2018-05-07 (×8): qty 1

## 2018-05-07 MED ORDER — ALBUTEROL SULFATE (2.5 MG/3ML) 0.083% IN NEBU
2.5000 mg | INHALATION_SOLUTION | Freq: Once | RESPIRATORY_TRACT | Status: AC
  Administered 2018-05-07: 2.5 mg via RESPIRATORY_TRACT

## 2018-05-07 NOTE — Progress Notes (Signed)
Progress Note  Patient Name: Alejandro [name] Date of Encounter: 05/07/2018  Primary Cardiologist: Rozann Lesches, MD   Subjective   Feeling well.  No chest pain or shortness of breath.    Inpatient Medications    Scheduled Meds: . albuterol  5 mg Nebulization Once  . aspirin  81 mg Oral Daily  . atorvastatin  80 mg Oral q1800  . cloNIDine  0.05 mg Oral Daily  . doxycycline  100 mg Oral Q12H  . furosemide  40 mg Oral BID  . insulin aspart  0-15 Units Subcutaneous TID WC  . insulin aspart  0-5 Units Subcutaneous QHS  . isosorbide-hydrALAZINE  1 tablet Oral BID  . losartan  50 mg Oral Daily  . nebivolol  5 mg Oral Daily  . pantoprazole  40 mg Oral Daily  . potassium chloride  20 mEq Oral BID  . sodium chloride flush  3 mL Intravenous Q12H   Continuous Infusions: . sodium chloride    . albuterol 10 mg/hr (05/02/18 1837)  . heparin 1,800 Units/hr (05/07/18 0728)   PRN Meds: sodium chloride, acetaminophen, ALPRAZolam, ondansetron (ZOFRAN) IV, oxyCODONE, sodium chloride flush   Vital Signs    Vitals:   05/06/18 1933 05/06/18 2300 05/07/18 0300 05/07/18 0759  BP: (!) 158/86 (!) 126/42 130/86 (!) 151/90  Pulse:  62 76 74  Resp: 16 20 17 17   Temp: 98 F (36.7 C) 97.9 F (36.6 C) 98.2 F (36.8 C) 97.7 F (36.5 C)  TempSrc: Oral Oral Oral Oral  SpO2:  96% 98% 98%  Weight:   241 lb 2.9 oz (109.4 kg)   Height:   5\' 11"  (1.803 m)     Intake/Output Summary (Last 24 hours) at 05/07/2018 0856 Last data filed at 05/07/2018 0800 Gross per 24 hour  Intake 1248 ml  Output 400 ml  Net 848 ml   Filed Weights   05/05/18 0300 05/06/18 0300 05/07/18 0300  Weight: 243 lb 9.7 oz (110.5 kg) 242 lb 1 oz (109.8 kg) 241 lb 2.9 oz (109.4 kg)    Telemetry    Sinus rhythm, PVCs.  No events.  - Personally Reviewed  ECG    05/03/18: Sinus tachycardia.  Rate 111 bpm.  Frequent PVCs.  Inferolateral ST depression.  - Personally Reviewed  Physical Exam   VS:  BP (!) 151/90 (BP  Location: Right Arm)   Pulse 74   Temp 97.7 F (36.5 C) (Oral)   Resp 17   Ht 5\' 11"  (1.803 m)   Wt 241 lb 2.9 oz (109.4 kg)   SpO2 98%   BMI 33.64 kg/m  , BMI Body mass index is 33.64 kg/m. GENERAL:  Well appearing HEENT: Pupils equal round and reactive, fundi not visualized, oral mucosa unremarkable NECK:  No jugular venous distention, waveform within normal limits, carotid upstroke brisk and symmetric, no bruits LUNGS:  Clear to auscultation bilaterally HEART:  RRR.  PMI not displaced or sustained,S1 and S2 within normal limits, no S3, no S4, no clicks, no rubs, II/VI systolic murmur at the LUSB ABD:  Flat, positive bowel sounds normal in frequency in pitch, no bruits, no rebound, no guarding, no midline pulsatile mass, no hepatomegaly, no splenomegaly EXT:  2 plus pulses throughout, no edema, no cyanosis no clubbing SKIN:  No rashes no nodules NEURO:  Cranial nerves II through XII grossly intact, motor grossly intact throughout PSYCH:  Cognitively intact, oriented to person place and time   [employer]  Lab 05/02/18 1825  05/05/18 0956 05/06/18 0242 05/07/18 0314  NA 138   < > 135 137 135  K 3.3*   < > 4.1 4.3 4.0  CL 103   < > 103 101 103  CO2 16*   < > 23 27 23   GLUCOSE 297*   < > 228* 148* 160*  BUN 20   < > 18 17 20   CREATININE 1.51*   < > 1.23 1.29* 1.28*  CALCIUM 8.6*   < > 8.8* 9.1 9.0  PROT 7.3  --   --   --   --   ALBUMIN 3.8  --   --   --   --   AST 25  --   --   --   --   ALT 16*  --   --   --   --   ALKPHOS 62  --   --   --   --   BILITOT 0.6  --   --   --   --   GFRNONAA 43*   < > 56* 53* 53*  GFRAA 50*   < > >60 >60 >60  ANIONGAP 19*   < > 9 9 9    < > = values in this interval not displayed.     Hematology Recent Labs  Lab 05/03/18 0444 05/06/18 0242 05/07/18 0314  WBC 10.4 10.4 11.6*  RBC 4.69 4.87 5.04  HGB 13.5 14.0 14.6  HCT 41.8 42.5 44.1  MCV 89.1 87.3 87.5  MCH 28.8 28.7 29.0  MCHC 32.3 32.9 33.1  RDW 16.1*  15.2 15.3  PLT 263 266 315    Cardiac Enzymes Recent Labs  Lab 05/02/18 1825 05/02/18 2302 05/03/18 0444 05/03/18 1014  TROPONINI 0.03* 0.41* 1.04* 1.19*   No results for input(s): TROPIPOC in the last 168 hours.   BNP Recent Labs  Lab 05/02/18 1825  BNP 632.0*     DDimer  Recent Labs  Lab 05/02/18 1825  DDIMER 1.13*     Radiology    Ct Coronary Morph W/cta Cor W/score W/ca W/cm &/or Wo/cm  Addendum Date: 05/06/2018   ADDENDUM REPORT: 05/06/2018 08:02 EXAM: OVER-READ INTERPRETATION  CT CHEST The following report is an over-read performed by radiologist Dr. Collene Leyden [facility] on 05/06/2018. This over-read does not include interpretation of cardiac or coronary anatomy or pathology. The coronary CTA interpretation by the cardiologist is attached. COMPARISON:  05/02/2018 FINDINGS: Mild cardiomegaly. Visualized aorta is normal caliber. Scattered calcifications in the visualized descending thoracic aorta. Scattered borderline sized mediastinal lymph nodes are stable since prior study, likely reactive. No confluent airspace opacities or effusions. No acute abnormality in the upper abdomen. Chest wall soft tissues are unremarkable. No acute bony abnormality. IMPRESSION: Mild cardiomegaly. Shotty mediastinal lymph nodes are stable, likely reactive. No acute findings. Electronically Signed   By: Rolm Baptise M.D.   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 CTA to exclude course between pulmonary artery and aorta as well as slit-like orifice/intramural course. EXAM: Cardiac/Coronary  CT TECHNIQUE: The patient was scanned on a [employer]. FINDINGS: A 120 kV prospective scan was triggered in the descending thoracic aorta at 111 HU's. Axial non-contrast 3  mm slices were carried out through the heart. The data set was  analyzed on a dedicated work station and scored using the [employer]. Gantry rotation speed was 250 msecs and collimation was .6 mm. No beta blockade and 0.8 mg of sl NTG was given. The 3D data set was reconstructed in 5% intervals of the 67-82 % of the R-R cycle. Diastolic phases were analyzed on a dedicated work station using MPR, MIP and VRT modes. The patient received 80 cc of contrast. Aorta:  Normal size.  Mild diffuse calcifications.  No dissection. Aortic Valve:  Trileaflet.  Thickened with moderate calcifications. Coronary Arteries:  Normal coronary origin.  Right dominance. RCA is a large dominant artery that gives rise to PDA and PLVB. There severe diffuse mixed plaque with > 70% stenosis in the distal segment. PDA has severe diffuse plaque with > 70% stenosis. Adjacent to RCA origin is a high RV marginal origin. This RV marginal branch is medium size artery and has minimal plaque. Left main has a very high origin just above the left coronary sinus, then continues in the normal course and gives rise to LAD and LCX arteries. There is minimal plaque. LAD is a medium size vessel that has severe diffuse predominantly calcified plaque. There is > 70% stenosis in its mid segment. LCX is a non-dominant artery that gives rise to one small OM1 branch. There is > 70% stenosis in the mid LCx artery post OM1 takeoff. OM1 is small. Other findings: Normal pulmonary vein drainage into the left atrium. Normal let atrial appendage without a thrombus. Normal size of the pulmonary artery. IMPRESSION: 1. Coronary calcium score of 3133. This was 21 percentile for age and sex matched control. 2. Normal coronary origin with left main originating above the left coronary sinus with normal course and no interarterial course. Right coronary sinus gives rise to RCA and a medium size high RV marginal branch (considered LAD on cardiac catheterization). 3. Severe three  vessel CAD with diffuse plaque and > 70% in all three coronary arteries, surgical consultation is recommended. Electronically Signed: By: Ena Dawley On: 05/05/2018 16:55    Cardiac Studies   Echo 05/03/18: Study Conclusions  - Left ventricle: The cavity size was moderately dilated. Wall   thickness was increased in a pattern of mild LVH. Systolic   function was normal. The estimated ejection fraction was in the   range of 50% to 55%. There is mild hypokinesis of the   basal-midinferior myocardium. Features are consistent with a   pseudonormal left ventricular filling pattern, with concomitant   abnormal relaxation and increased filling pressure (grade 2   diastolic dysfunction). - Aortic valve: Mildly to moderately calcified annulus. Trileaflet;   moderately calcified leaflets. Noncoronary cusp mobility was   restricted. There was mild stenosis. There was trivial   regurgitation. Mean gradient (S): 22 mm Hg. Peak gradient (S): 39   mm Hg. VTI ratio of LVOT to aortic valve: 0.42. Valve area (VTI):   2.08 cm^2. Valve area (Vmax): 1.9 cm^2. Valve area (Vmean): 1.86   cm^2. - Mitral valve: Moderately calcified annulus. There was mild   regurgitation. - Left atrium: The atrium was moderately dilated. - Right atrium: Central venous pressure (est): 8 mm Hg. - Atrial septum: No defect or patent foramen ovale was identified. - Tricuspid valve: There was trivial regurgitation. - Pulmonary arteries: Systolic pressure was moderately increased.   PA peak pressure: 50 mm Hg (S). - Coronary sinus: The vessel was dilated. - Pericardium, extracardiac: There was no pericardial effusion.  LHC 05/04/18:   Technically  difficult procedure to complete due to subclavian and innominate artery tortuosity.  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  The vessel contains diffuse luminal irregularities from the  proximal to the distal third of the vessel.  No focal high-grade obstruction is noted.  Need to exclude course between pulmonary artery and aorta as well as slitlike orifice/intramural course.  First diagonal, which arises from the left main, contains calcified segmental 95% stenosis beyond the septal perforator.  There is also a large septal perforator that arises from the diagonal.  Circumflex coronary artery gives origin to a trifurcating obtuse marginal #1 with the first branch of the trifurcation containing 95 to 99% stenosis.  This branch is small to moderate in size.  Continuation of the circumflex beyond the first obtuse marginal contains 70% stenosis before ending on a very tiny second obtuse marginal branch.  The left main is widely patent.  The right coronary artery is dominant and contains 50 to 60% distal stenosis, PDA contains 75% ostial stenosis, first LV branch contains segmental 80% stenosis, and large second left ventricular branch contains segmental 60 to 70% proximal narrowing.  Overall LV function could not be assessed by the study.  EDP is markedly elevated at 26 mmHg suggesting an acute on chronic diastolic heart failure process given echo EF of 55%.  RECOMMENDATIONS:   Coronary CT angio to evaluate the course and ostium of the LAD.  If there are high risk features, should consider surgical consult and heart team approach concerning revascularization options. The AAOCA-LAD supplies the mid and distal third of the anterior wall.  If LAD is not potentially ischemia inducing, diagonal could be intervened upon.  The branch of the obtuse marginal would be more difficult.  For the time being, medical management of heart failure, monitor kidney function, risk factor modification, and and decision concerning revascularization after CT.   Coronary CT-A 05/05/18:  IMPRESSION: 1. Coronary calcium score of 3133. This was 69 percentile for age and sex matched control.  2. Normal  coronary origin with left main originating above the left coronary sinus with normal course and no interarterial course. Right coronary sinus gives rise to RCA and a medium size high RV marginal branch (considered LAD on cardiac catheterization).  3. Severe three vessel CAD with diffuse plaque and > 70% in all three coronary arteries, surgical consultation is recommended.  Patient Profile     77 y.o. male with obstructive CAD, anomalous LAD, hypertension, hyperlipidemia, diabetes, and mild aortic stenosis here with NSTEMI and acute diastolic heart failure.    Assessment & Plan    # NSTEMI:  # Anomalous coronary: Troponin mildly elevated to 1.19.  He has not had any chest pain or pressure but has had progressive exertional dyspnea.  Echo revealed focal wall motion abnormalities in the basal inferior myocardium.  EKG also had ST depressions.  He  underwent left heart catheterization 05/04/2018 and there was concern for an anomalous LAD arising from the right sinus of Valsalva superior to the origin of the RCA.  No high-grade obstruction was noted in this vessel, but it was unclear if the course went between the pulmonary artery and the aorta.  He had a coronary CT-A that revealed a normal coronary origin with the left main originating above the left coronary sinus.  There is no intra-arterial course.  He has severe three-vessel disease with diffuse greater than 70% stenosis.  Dr. Prescott Gum will see him today with tentative plans for CABG Tuesday.  Mr. Wettstein wants to go home first and this is reasonable.  He is chest pain free with ambulation.  He will need to wait for his vascular studies today.  Continue aspirin, atorvastatin, heparin, and nebivolol.  Increase losartan.  # Moderate aortic stenosis: Mean gradient 22 mmHg on echo this admission.  # Acute diastolic heart failure: # Hypertension: Feeling better with diuresis.  I/O not accurate.  Weight continues to go down and renal function is  stable.  Continue Lasix 40 mg twice daily.  Blood pressure was poorly controlled.  We cannot increase his nebivolol due to bradycardia. Clonidine has been weaned.  He will get his last dose this AM. Increase losartan to 50mg  (new this admission).  Continue home Bidil.   # Hyperlipidemia:  Atorvastatin started this admission.  Check lipids/CMP in 6-8 weeks.     For questions or updates, please contact [place] Please consult [email or web address] for contact info under Cardiology/STEMI.      Signed, Skeet Latch, MD  05/07/2018, 8:56 AM

## 2018-05-07 NOTE — Plan of Care (Signed)
Continue current care plan 

## 2018-05-07 NOTE — Progress Notes (Signed)
        Landry Mellow, RDMS, RVT

## 2018-05-07 NOTE — Progress Notes (Signed)
Dr. Prescott Gum feels patient would be best served staying, patient amenable, Dr. Oval Linsey made aware. I changed doxy RX to reflect the recommended 14 day course intended by IM when they admitted him (last day 6/16). Srinidhi Landers PA-C

## 2018-05-07 NOTE — Progress Notes (Signed)
CARDIAC REHAB PHASE I   PRE:  Rate/Rhythm: 86 SR  BP:  Supine:   Sitting: 109/68  Standing:    SaO2: 97%RA  MODE:  Ambulation: 340 ft   POST:  Rate/Rhythm: 90 SR  BP:  Supine:   Sitting: 118/61  Standing:    SaO2: 93%RA 0920-1000 Pt was glad to walk. Walked 340 ft on RA with steady gait. No CP. Gave pt OHS booklet and care guide. Discussed sternal precautions and gave in the tube handout. Discussed importance of IS and walking after surgery. Put on pre op video for him to view. Gave IS and he could demonstrate [phone] ml correctly. Pt lives alone. Stated he would get his sister to stay with him after discharge or hire someone. Not interested in Rehab. Encouraged walks with staff.   Graylon Good, RN BSN  05/07/2018 9:54 AM

## 2018-05-07 NOTE — Progress Notes (Signed)
PRELIM   Right Carotid:Velocities in the right ICA are consistent with a 1-39% stenosis.  Left Carotid: Velocities in the left ICA are consistent with a 1-39% stenosis.    Right ABI: Resting right ankle-brachial index is within normal range, based on PT pressure. AT pressure indicates moderate disease.  Left ABI: Resting left ankle-brachial index is within normal range, based on PT pressure. AT pressure indicates moderate disease.     Right Upper Extremity: Radial Doppler waveform obliterate with compression. Ulnar Doppler waveforms remain within normal limits with compression.  Left Upper Extremity: Radial and Ulnar Doppler waveforms decrease >50% with compression.   Landry Mellow, RDMS, RVT

## 2018-05-07 NOTE — Progress Notes (Signed)
ANTICOAGULATION CONSULT NOTE - Follow Up Consult  Pharmacy Consult for heparin Indication: CAD awaiting surgical consult  Labs: Recent Labs    05/05/18 0956 05/05/18 1956 05/06/18 0242 05/07/18 0314  HGB  --   --  14.0 14.6  HCT  --   --  42.5 44.1  PLT  --   --  266 315  HEPARINUNFRC 0.18* 0.34 0.44 0.47  CREATININE 1.23  --  1.29* 1.28*    Assessment: 77 yo male s/p cath. Difficult procedure, no intervention performed. Heparin restarted post cath, pending decision of PCI vs surgery.   Heparin level is therapeutic (HL= 0.47) on 1800 units/hr.  Goal of Therapy:  Heparin level 0.3-0.7 units/ml   Plan:  Continue heparin at 1800 units/hr. Next heparin level with AM labs.  Erin Hearing PharmD., BCPS Clinical Pharmacist 05/07/2018 8:44 AM

## 2018-05-08 ENCOUNTER — Inpatient Hospital Stay (HOSPITAL_COMMUNITY): Payer: Medicare Other

## 2018-05-08 LAB — GLUCOSE, CAPILLARY
GLUCOSE-CAPILLARY: 131 mg/dL — AB (ref 65–99)
GLUCOSE-CAPILLARY: 167 mg/dL — AB (ref 65–99)
GLUCOSE-CAPILLARY: 109 mg/dL — AB (ref 65–99)
Glucose-Capillary: 159 mg/dL — ABNORMAL HIGH (ref 65–99)

## 2018-05-08 LAB — CBC
HCT: 42 % (ref 39.0–52.0)
Hemoglobin: 13.7 g/dL (ref 13.0–17.0)
MCH: 28.9 pg (ref 26.0–34.0)
MCHC: 32.6 g/dL (ref 30.0–36.0)
MCV: 88.6 fL (ref 78.0–100.0)
Platelets: 283 10*3/uL (ref 150–400)
RBC: 4.74 MIL/uL (ref 4.22–5.81)
RDW: 15.4 % (ref 11.5–15.5)
WBC: 12.2 10*3/uL — ABNORMAL HIGH (ref 4.0–10.5)

## 2018-05-08 LAB — COMPREHENSIVE METABOLIC PANEL
ALT: 25 U/L (ref 17–63)
AST: 21 U/L (ref 15–41)
Albumin: 3.4 g/dL — ABNORMAL LOW (ref 3.5–5.0)
Alkaline Phosphatase: 80 U/L (ref 38–126)
Anion gap: 9 (ref 5–15)
BUN: 19 mg/dL (ref 6–20)
CO2: 21 mmol/L — ABNORMAL LOW (ref 22–32)
Calcium: 8.5 mg/dL — ABNORMAL LOW (ref 8.9–10.3)
Chloride: 107 mmol/L (ref 101–111)
Creatinine, Ser: 1.2 mg/dL (ref 0.61–1.24)
GFR calc Af Amer: >60 mL/min (ref >60)
GFR calc non Af Amer: 57 mL/min — ABNORMAL LOW (ref >60)
Glucose, Bld: 137 mg/dL — ABNORMAL HIGH (ref 65–99)
Potassium: 3.7 mmol/L (ref 3.5–5.1)
Sodium: 137 mmol/L (ref 135–145)
Total Bilirubin: 0.9 mg/dL (ref 0.3–1.2)
Total Protein: 6.6 g/dL (ref 6.5–8.1)

## 2018-05-08 LAB — HEPARIN LEVEL (UNFRACTIONATED): HEPARIN UNFRACTIONATED: 0.41 [IU]/mL (ref 0.30–0.70)

## 2018-05-08 NOTE — Progress Notes (Signed)
4 Days Post-Op Procedure(s) (LRB): LEFT HEART CATH AND CORONARY ANGIOGRAPHY (N/A) Subjective: Remains stable without recurrent CHF symptoms Creatinine remained stable after multiple IV contrast exposures Sinus rhythm Dopplers unremarkable except for bilateral positive Allen test Plan multivessel CABG Tuesday, June 11  Objective: Vital signs in last 24 hours: Temp:  [97.6 F (36.4 C)-98 F (36.7 C)] 97.6 F (36.4 C) (06/08 1126) Pulse Rate:  [66-78] 78 (06/08 1106) Cardiac Rhythm: Normal sinus rhythm (06/08 1106) Resp:  [18-20] 20 (06/08 1106) BP: (106-177)/(64-91) 106/76 (06/08 1106) SpO2:  [94 %-98 %] 98 % (06/08 1106) Weight:  [239 lb 11.2 oz (108.7 kg)] 239 lb 11.2 oz (108.7 kg) (06/08 0500)  Hemodynamic parameters for last 24 hours:    Intake/Output from previous day: 06/07 0701 - 06/08 0700 In: 1170 [P.O.:840; I.V.:330] Out: 850 [Urine:850] Intake/Output this shift: Total I/O In: 577.8 [P.O.:360; I.V.:217.8] Out: 575 [Urine:575]  Alert and comfortable Heart rate regular Lungs clear No hematoma at cath site  Lab Results: Recent Labs    05/07/18 0314 05/08/18 0309  WBC 11.6* 12.2*  HGB 14.6 13.7  HCT 44.1 42.0  PLT 315 283   BMET:  Recent Labs    05/07/18 0314 05/08/18 0309  NA 135 137  K 4.0 3.7  CL 103 107  CO2 23 21*  GLUCOSE 160* 137*  BUN 20 19  CREATININE 1.28* 1.20  CALCIUM 9.0 8.5*    PT/INR: No results for input(s): LABPROT, INR in the last 72 hours. ABG No results found for: PHART, HCO3, TCO2, ACIDBASEDEF, O2SAT CBG (last 3)  Recent Labs    05/07/18 2134 05/08/18 0730 05/08/18 1107  GLUCAP 140* 131* 167*    Assessment/Plan: S/P Procedure(s) (LRB): LEFT HEART CATH AND CORONARY ANGIOGRAPHY (N/A) CABG 6-11 am   LOS: 6 days    Alejandro [name] 05/08/2018

## 2018-05-08 NOTE — Progress Notes (Signed)
Progress Note  Patient Name: Alejandro [name] Date of Encounter: 05/08/2018  Primary Cardiologist: Rozann Lesches, MD   Subjective   Feeling well.  No chest pain or shortness of breath.    Inpatient Medications    Scheduled Meds: . albuterol  5 mg Nebulization Once  . aspirin  81 mg Oral Daily  . atorvastatin  80 mg Oral q1800  . doxycycline  100 mg Oral Q12H  . furosemide  40 mg Oral BID  . insulin aspart  0-15 Units Subcutaneous TID WC  . insulin aspart  0-5 Units Subcutaneous QHS  . isosorbide-hydrALAZINE  1 tablet Oral BID  . losartan  50 mg Oral Daily  . nebivolol  5 mg Oral Daily  . pantoprazole  40 mg Oral Daily  . potassium chloride  20 mEq Oral BID  . sodium chloride flush  3 mL Intravenous Q12H   Continuous Infusions: . sodium chloride    . albuterol 10 mg/hr (05/02/18 1837)  . heparin 1,800 Units/hr (05/07/18 2320)   PRN Meds: sodium chloride, acetaminophen, ALPRAZolam, ondansetron (ZOFRAN) IV, oxyCODONE, sodium chloride flush   Vital Signs    Vitals:   05/07/18 2014 05/07/18 2328 05/08/18 0500 05/08/18 0804  BP: (!) 177/91 131/64 137/79 (!) 156/88  Pulse: 66 67 71 76  Resp: 20 18 20 20   Temp: 97.6 F (36.4 C) 98 F (36.7 C) 97.9 F (36.6 C) 98 F (36.7 C)  TempSrc: Oral Oral Oral Oral  SpO2: 98% 94% 94% 98%  Weight:   108.7 kg (239 lb 11.2 oz)   Height:        Intake/Output Summary (Last 24 hours) at 05/08/2018 0955 Last data filed at 05/08/2018 0845 Gross per 24 hour  Intake 876 ml  Output 950 ml  Net -74 ml   Neg 3.17 L   Filed Weights   05/06/18 0300 05/07/18 0300 05/08/18 0500  Weight: 109.8 kg (242 lb 1 oz) 109.4 kg (241 lb 2.9 oz) 108.7 kg (239 lb 11.2 oz)    Telemetry    Sinus rhythm, PVCs.  No events.  - Personally Reviewed  ECG    05/03/18: Sinus tachycardia.  Rate 111 bpm.  Frequent PVCs.  Inferolateral ST depression.  - Personally Reviewed  Physical Exam   VS:  BP (!) 156/88 (BP Location: Right Arm)   Pulse 76   Temp  98 F (36.7 C) (Oral)   Resp 20   Ht 5\' 11"  (1.803 m)   Wt 108.7 kg (239 lb 11.2 oz)   SpO2 98%   BMI 33.43 kg/m  , BMI Body mass index is 33.43 kg/m. GENERAL:  Well appearing  NECK:  No jugular venous distention, LUNGS:  Clear to auscultation bilaterally HEART:  RRR.  PMI not displaced or sustained,S1 and S2 within normal limits, no S3, no S4, no clicks, no rubs ABD:  Flat, positive bowel sounds normal in frequency in pitch, no bruits, no rebound,  EXT:  2 plus pulses throughout, no edema, no cyanosis no clubbing  Labs    Chemistry Recent Labs  Lab 05/02/18 1825  05/06/18 0242 05/07/18 0314 05/08/18 0309  NA 138   < > 137 135 137  K 3.3*   < > 4.3 4.0 3.7  CL 103   < > 101 103 107  CO2 16*   < > 27 23 21*  GLUCOSE 297*   < > 148* 160* 137*  BUN 20   < > 17 20 19   CREATININE 1.51*   < >  1.29* 1.28* 1.20  CALCIUM 8.6*   < > 9.1 9.0 8.5*  PROT 7.3  --   --   --  6.6  ALBUMIN 3.8  --   --   --  3.4*  AST 25  --   --   --  21  ALT 16*  --   --   --  25  ALKPHOS 62  --   --   --  80  BILITOT 0.6  --   --   --  0.9  GFRNONAA 43*   < > 53* 53* 57*  GFRAA 50*   < > >60 >60 >60  ANIONGAP 19*   < > 9 9 9    < > = values in this interval not displayed.     Hematology Recent Labs  Lab 05/06/18 0242 05/07/18 0314 05/08/18 0309  WBC 10.4 11.6* 12.2*  RBC 4.87 5.04 4.74  HGB 14.0 14.6 13.7  HCT 42.5 44.1 42.0  MCV 87.3 87.5 88.6  MCH 28.7 29.0 28.9  MCHC 32.9 33.1 32.6  RDW 15.2 15.3 15.4  PLT 266 315 283    Cardiac Enzymes Recent Labs  Lab 05/02/18 1825 05/02/18 2302 05/03/18 0444 05/03/18 1014  TROPONINI 0.03* 0.41* 1.04* 1.19*   No results for input(s): TROPIPOC in the last 168 hours.   BNP Recent Labs  Lab 05/02/18 1825  BNP 632.0*     DDimer  Recent Labs  Lab 05/02/18 1825  DDIMER 1.13*     Radiology    Dg Chest 2 View  Result Date: 05/08/2018 CLINICAL DATA:  Congestive heart failure EXAM: CHEST - 2 VIEW COMPARISON:  05/05/2018 FINDINGS:  Cardiomegaly with stable aortic contours. There is no edema, consolidation, effusion, or pneumothorax. IMPRESSION: Cardiomegaly without failure. Electronically Signed   By: Monte Fantasia M.D.   On: 05/08/2018 09:32    Cardiac Studies   Echo 05/03/18: Study Conclusions  - Left ventricle: The cavity size was moderately dilated. Wall   thickness was increased in a pattern of mild LVH. Systolic   function was normal. The estimated ejection fraction was in the   range of 50% to 55%. There is mild hypokinesis of the   basal-midinferior myocardium. Features are consistent with a   pseudonormal left ventricular filling pattern, with concomitant   abnormal relaxation and increased filling pressure (grade 2   diastolic dysfunction). - Aortic valve: Mildly to moderately calcified annulus. Trileaflet;   moderately calcified leaflets. Noncoronary cusp mobility was   restricted. There was mild stenosis. There was trivial   regurgitation. Mean gradient (S): 22 mm Hg. Peak gradient (S): 39   mm Hg. VTI ratio of LVOT to aortic valve: 0.42. Valve area (VTI):   2.08 cm^2. Valve area (Vmax): 1.9 cm^2. Valve area (Vmean): 1.86   cm^2. - Mitral valve: Moderately calcified annulus. There was mild   regurgitation. - Left atrium: The atrium was moderately dilated. - Right atrium: Central venous pressure (est): 8 mm Hg. - Atrial septum: No defect or patent foramen ovale was identified. - Tricuspid valve: There was trivial regurgitation. - Pulmonary arteries: Systolic pressure was moderately increased.   PA peak pressure: 50 mm Hg (S). - Coronary sinus: The vessel was dilated. - Pericardium, extracardiac: There was no pericardial effusion.  LHC 05/04/18:   Technically difficult procedure to complete due to subclavian and innominate artery tortuosity.  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  The vessel contains  diffuse luminal irregularities from the proximal to the distal third of the vessel.  No focal high-grade obstruction is noted.  Need to exclude course between pulmonary artery and aorta as well as slitlike orifice/intramural course.  First diagonal, which arises from the left main, contains calcified segmental 95% stenosis beyond the septal perforator.  There is also a large septal perforator that arises from the diagonal.  Circumflex coronary artery gives origin to a trifurcating obtuse marginal #1 with the first branch of the trifurcation containing 95 to 99% stenosis.  This branch is small to moderate in size.  Continuation of the circumflex beyond the first obtuse marginal contains 70% stenosis before ending on a very tiny second obtuse marginal branch.  The left main is widely patent.  The right coronary artery is dominant and contains 50 to 60% distal stenosis, PDA contains 75% ostial stenosis, first LV branch contains segmental 80% stenosis, and large second left ventricular branch contains segmental 60 to 70% proximal narrowing.  Overall LV function could not be assessed by the study.  EDP is markedly elevated at 26 mmHg suggesting an acute on chronic diastolic heart failure process given echo EF of 55%.  RECOMMENDATIONS:   Coronary CT angio to evaluate the course and ostium of the LAD.  If there are high risk features, should consider surgical consult and heart team approach concerning revascularization options. The AAOCA-LAD supplies the mid and distal third of the anterior wall.  If LAD is not potentially ischemia inducing, diagonal could be intervened upon.  The branch of the obtuse marginal would be more difficult.  For the time being, medical management of heart failure, monitor kidney function, risk factor modification, and and decision concerning revascularization after CT.   Coronary CT-A 05/05/18:  IMPRESSION: 1. Coronary calcium score of 3133. This was 71 percentile for  age and sex matched control.  2. Normal coronary origin with left main originating above the left coronary sinus with normal course and no interarterial course. Right coronary sinus gives rise to RCA and a medium size high RV marginal branch (considered LAD on cardiac catheterization).  3. Severe three vessel CAD with diffuse plaque and > 70% in all three coronary arteries, surgical consultation is recommended.  Patient Profile     77 y.o. male with CAD, hypertension, hyperlipidemia, diabetes, and mild aortic stenosis here with NSTEMI and acute diastolic heart failure.    Assessment & Plan    # NSTEMI:  # Anomalous coronary: Troponin mildly elevated to 1.13.  left heart catheterization 05/04/2018 and there was concern for an anomalous LAD arising from the right sinus of Valsalva superior to the origin of the RCA.   He had a coronary CT-A that revealed a normal coronary origin with the left main originating above the left coronary sinus.  He has severe three-vessel disease with diffuse greater than 70% stenosis  Plan for CABG Tuesday   Continue aspirin, atorvastatin, heparin, and nebivolol.  Increase losartan.  # Moderate aortic stenosis: Mean gradient 22 mmHg on echo this admission.  # Acute diastolic heart failure:  Volume status is not bad  WIll continue to follow I/O  # Hypertension:  # Hyperlipidemia:  Atorvastatin started this admission.  Check lipids/CMP in 6-8 weeks.     For questions or updates, please contact [place] Please consult [email or web address] for contact info under Cardiology/STEMI.      Signed, Dorris Carnes, MD  05/08/2018, 9:55 AM

## 2018-05-08 NOTE — Progress Notes (Signed)
CARDIAC REHAB PHASE I   PRE:  Rate/Rhythm: 88 SR  BP:  Supine:   Sitting: 100/63  Standing:    SaO2: 96%RA  MODE:  Ambulation: 680 ft   POST:  Rate/Rhythm: 93 SR  BP:  Supine:   Sitting: 133/70  Standing:    SaO2: 97%RA 1518-3437 Pt ready to walk. Pt walked 680 ft on RA with steady gait and tolerated well. No CP. Pt encouraged to ask staff to walk with him later as he really likes to walk. Legs tired by end of walk.    Graylon Good, RN BSN  05/08/2018 9:44 AM

## 2018-05-08 NOTE — Progress Notes (Signed)
Visited with patient and 2 friends.  Went over AD form with patient and he hopes to fill it out and have completed by Monday before surgery on Tuesday early a.m. Shared he can have nurse request chaplain to come Monday and this can be done before 3. Conard Novak, Chaplain   05/08/18 1300  Clinical Encounter Type  Visited With Patient;Other (Comment) (family friend and coworker also)  Visit Type Initial;Spiritual support;Pre-op;Other (Comment) (AD material shared so he can fill it out by Monday)  Referral From Physician  Consult/Referral To Chaplain  Spiritual Encounters  Spiritual Needs Literature;Prayer  Stress Factors  Patient Stress Factors None identified  Family Stress Factors None identified

## 2018-05-08 NOTE — Plan of Care (Signed)
Continue current care plan 

## 2018-05-08 NOTE — Progress Notes (Signed)
ANTICOAGULATION CONSULT NOTE - Follow Up Consult  Pharmacy Consult for IV heparin Indication: CAD awaiting CABG  No Known Allergies  Patient Measurements: Height: 5\' 11"  (180.3 cm) Weight: 239 lb 11.2 oz (108.7 kg) IBW/kg (Calculated) : 75.3 Heparin Dosing Weight: 99  Vital Signs: Temp: 98 F (36.7 C) (06/08 0804) Temp Source: Oral (06/08 0804) BP: 156/88 (06/08 0804) Pulse Rate: 76 (06/08 0804)  Labs: Recent Labs    05/06/18 0242 05/07/18 0314 05/08/18 0309  HGB 14.0 14.6 13.7  HCT 42.5 44.1 42.0  PLT 266 315 283  HEPARINUNFRC 0.44 0.47 0.41  CREATININE 1.29* 1.28* 1.20    Estimated Creatinine Clearance: 66.7 mL/min (by C-G formula based on SCr of 1.2 mg/dL).   Medications:  Infusions:  . sodium chloride    . albuterol 10 mg/hr (05/02/18 1837)  . heparin 1,800 Units/hr (05/07/18 2320)    Assessment: 77 yo male s/p cath, awaiting CABG on Tuesday.  No overt bleeding or complications noted.  CBC stable.  Goal of Therapy:  Heparin level 0.3-0.7 units/ml Monitor platelets by anticoagulation protocol: Yes   Plan:  1. Continue IV Heparin at current rate. 2. Daily heparin level and CBC.  Marguerite Olea, [facility] Clinical Pharmacist Pager [phone]  05/08/2018 11:20 AM

## 2018-05-09 LAB — GLUCOSE, CAPILLARY
GLUCOSE-CAPILLARY: 163 mg/dL — AB (ref 65–99)
GLUCOSE-CAPILLARY: 112 mg/dL — AB (ref 65–99)
GLUCOSE-CAPILLARY: 126 mg/dL — AB (ref 65–99)
Glucose-Capillary: 182 mg/dL — ABNORMAL HIGH (ref 65–99)

## 2018-05-09 LAB — CBC
HCT: 44.5 % (ref 39.0–52.0)
Hemoglobin: 14.3 g/dL (ref 13.0–17.0)
MCH: 29.4 pg (ref 26.0–34.0)
MCHC: 32.1 g/dL (ref 30.0–36.0)
MCV: 91.4 fL (ref 78.0–100.0)
PLATELETS: 224 10*3/uL (ref 150–400)
RBC: 4.87 MIL/uL (ref 4.22–5.81)
RDW: 15.5 % (ref 11.5–15.5)
WBC: 13 10*3/uL — ABNORMAL HIGH (ref 4.0–10.5)

## 2018-05-09 LAB — HEPARIN LEVEL (UNFRACTIONATED): Heparin Unfractionated: 0.53 IU/mL (ref 0.30–0.70)

## 2018-05-09 MED ORDER — CHLORHEXIDINE GLUCONATE 4 % EX LIQD
Freq: Two times a day (BID) | CUTANEOUS | Status: DC
  Administered 2018-05-09 – 2018-05-10 (×4): via TOPICAL
  Filled 2018-05-09: qty 60
  Filled 2018-05-09: qty 15
  Filled 2018-05-09: qty 90

## 2018-05-09 MED ORDER — CHLORHEXIDINE GLUCONATE 4 % EX LIQD
CUTANEOUS | Status: AC
  Filled 2018-05-09: qty 15

## 2018-05-09 MED ORDER — NYSTATIN-TRIAMCINOLONE 100000-0.1 UNIT/GM-% EX CREA
TOPICAL_CREAM | Freq: Two times a day (BID) | CUTANEOUS | Status: DC
  Administered 2018-05-09 – 2018-05-10 (×4): via TOPICAL
  Filled 2018-05-09: qty 30
  Filled 2018-05-09: qty 15

## 2018-05-09 NOTE — Progress Notes (Signed)
Pt. Wants his toe nails cut and done. Informed patient that staff can not cut toe nails. Will inform MD.

## 2018-05-09 NOTE — Progress Notes (Signed)
Progress Note  Patient Name: Alejandro [name] Date of Encounter: 05/09/2018  Primary Cardiologist: Rozann Lesches, MD   Subjective   No chest pain or shortness of breath.    Inpatient Medications    Scheduled Meds: . albuterol  5 mg Nebulization Once  . aspirin  81 mg Oral Daily  . atorvastatin  80 mg Oral q1800  . doxycycline  100 mg Oral Q12H  . furosemide  40 mg Oral BID  . insulin aspart  0-15 Units Subcutaneous TID WC  . insulin aspart  0-5 Units Subcutaneous QHS  . isosorbide-hydrALAZINE  1 tablet Oral BID  . losartan  50 mg Oral Daily  . nebivolol  5 mg Oral Daily  . pantoprazole  40 mg Oral Daily  . potassium chloride  20 mEq Oral BID  . sodium chloride flush  3 mL Intravenous Q12H   Continuous Infusions: . sodium chloride    . albuterol 10 mg/hr (05/02/18 1837)  . heparin 1,800 Units/hr (05/09/18 0224)   PRN Meds: sodium chloride, acetaminophen, ALPRAZolam, ondansetron (ZOFRAN) IV, oxyCODONE, sodium chloride flush   Vital Signs    Vitals:   05/08/18 2100 05/08/18 2342 05/09/18 0700 05/09/18 0738  BP: (!) 170/82 118/72  (!) 154/89  Pulse:    77  Resp:  17  15  Temp:  97.6 F (36.4 C)  98 F (36.7 C)  TempSrc:  Oral  Oral  SpO2:    96%  Weight:   107.5 kg (236 lb 14.4 oz)   Height:        Intake/Output Summary (Last 24 hours) at 05/09/2018 1018 Last data filed at 05/09/2018 0800 Gross per 24 hour  Intake 1320 ml  Output 2125 ml  Net -805 ml   Neg 3.17 L   Filed Weights   05/07/18 0300 05/08/18 0500 05/09/18 0700  Weight: 109.4 kg (241 lb 2.9 oz) 108.7 kg (239 lb 11.2 oz) 107.5 kg (236 lb 14.4 oz)    Telemetry    Sinus rhythm,  - Personally Reviewed  ECG    05/03/18: Sinus tachycardia.  Rate 111 bpm.  Frequent PVCs.  Inferolateral ST depression.  - Personally Reviewed  Physical Exam   VS:  BP (!) 154/89 (BP Location: Right Arm)   Pulse 77   Temp 98 F (36.7 C) (Oral)   Resp 15   Ht 5\' 11"  (1.803 m)   Wt 107.5 kg (236 lb 14.4 oz)    SpO2 96%   BMI 33.04 kg/m  , BMI Body mass index is 33.04 kg/m. GENERAL:  Well appearing  In NAD  NECK:  JVP normal   LUNGS:  Clear to auscultation bilaterally HEART:  RRR.  PMI not displaced or sustained,S1 and S2 within normal limits, no S3, no S4, no clicks, no rubs ABD:  Flat, positive bowel sounds normal in frequency in pitch, no bruits, no rebound,  EXT:  2 plus pulses throughout, no edema, no cyanosis no clubbing  Labs    Chemistry Recent Labs  Lab 05/02/18 1825  05/06/18 0242 05/07/18 0314 05/08/18 0309  NA 138   < > 137 135 137  K 3.3*   < > 4.3 4.0 3.7  CL 103   < > 101 103 107  CO2 16*   < > 27 23 21*  GLUCOSE 297*   < > 148* 160* 137*  BUN 20   < > 17 20 19   CREATININE 1.51*   < > 1.29* 1.28* 1.20  CALCIUM 8.6*   < >  9.1 9.0 8.5*  PROT 7.3  --   --   --  6.6  ALBUMIN 3.8  --   --   --  3.4*  AST 25  --   --   --  21  ALT 16*  --   --   --  25  ALKPHOS 62  --   --   --  80  BILITOT 0.6  --   --   --  0.9  GFRNONAA 43*   < > 53* 53* 57*  GFRAA 50*   < > >60 >60 >60  ANIONGAP 19*   < > 9 9 9    < > = values in this interval not displayed.     Hematology Recent Labs  Lab 05/07/18 0314 05/08/18 0309 05/09/18 0148  WBC 11.6* 12.2* 13.0*  RBC 5.04 4.74 4.87  HGB 14.6 13.7 14.3  HCT 44.1 42.0 44.5  MCV 87.5 88.6 91.4  MCH 29.0 28.9 29.4  MCHC 33.1 32.6 32.1  RDW 15.3 15.4 15.5  PLT 315 283 224    Cardiac Enzymes Recent Labs  Lab 05/02/18 1825 05/02/18 2302 05/03/18 0444 05/03/18 1014  TROPONINI 0.03* 0.41* 1.04* 1.19*   No results for input(s): TROPIPOC in the last 168 hours.   BNP Recent Labs  Lab 05/02/18 1825  BNP 632.0*     DDimer  Recent Labs  Lab 05/02/18 1825  DDIMER 1.13*     Radiology    Dg Chest 2 View  Result Date: 05/08/2018 CLINICAL DATA:  Congestive heart failure EXAM: CHEST - 2 VIEW COMPARISON:  05/05/2018 FINDINGS: Cardiomegaly with stable aortic contours. There is no edema, consolidation, effusion, or  pneumothorax. IMPRESSION: Cardiomegaly without failure. Electronically Signed   By: Monte Fantasia M.D.   On: 05/08/2018 09:32    Cardiac Studies   Echo 05/03/18: Study Conclusions  - Left ventricle: The cavity size was moderately dilated. Wall   thickness was increased in a pattern of mild LVH. Systolic   function was normal. The estimated ejection fraction was in the   range of 50% to 55%. There is mild hypokinesis of the   basal-midinferior myocardium. Features are consistent with a   pseudonormal left ventricular filling pattern, with concomitant   abnormal relaxation and increased filling pressure (grade 2   diastolic dysfunction). - Aortic valve: Mildly to moderately calcified annulus. Trileaflet;   moderately calcified leaflets. Noncoronary cusp mobility was   restricted. There was mild stenosis. There was trivial   regurgitation. Mean gradient (S): 22 mm Hg. Peak gradient (S): 39   mm Hg. VTI ratio of LVOT to aortic valve: 0.42. Valve area (VTI):   2.08 cm^2. Valve area (Vmax): 1.9 cm^2. Valve area (Vmean): 1.86   cm^2. - Mitral valve: Moderately calcified annulus. There was mild   regurgitation. - Left atrium: The atrium was moderately dilated. - Right atrium: Central venous pressure (est): 8 mm Hg. - Atrial septum: No defect or patent foramen ovale was identified. - Tricuspid valve: There was trivial regurgitation. - Pulmonary arteries: Systolic pressure was moderately increased.   PA peak pressure: 50 mm Hg (S). - Coronary sinus: The vessel was dilated. - Pericardium, extracardiac: There was no pericardial effusion.  LHC 05/04/18:   Technically difficult procedure to complete due to subclavian and innominate artery tortuosity.  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  The vessel contains diffuse luminal irregularities from the  proximal to the distal third of the vessel.  No  focal high-grade obstruction is noted.  Need to exclude course between pulmonary artery and aorta as well as slitlike orifice/intramural course.  First diagonal, which arises from the left main, contains calcified segmental 95% stenosis beyond the septal perforator.  There is also a large septal perforator that arises from the diagonal.  Circumflex coronary artery gives origin to a trifurcating obtuse marginal #1 with the first branch of the trifurcation containing 95 to 99% stenosis.  This branch is small to moderate in size.  Continuation of the circumflex beyond the first obtuse marginal contains 70% stenosis before ending on a very tiny second obtuse marginal branch.  The left main is widely patent.  The right coronary artery is dominant and contains 50 to 60% distal stenosis, PDA contains 75% ostial stenosis, first LV branch contains segmental 80% stenosis, and large second left ventricular branch contains segmental 60 to 70% proximal narrowing.  Overall LV function could not be assessed by the study.  EDP is markedly elevated at 26 mmHg suggesting an acute on chronic diastolic heart failure process given echo EF of 55%.  RECOMMENDATIONS:   Coronary CT angio to evaluate the course and ostium of the LAD.  If there are high risk features, should consider surgical consult and heart team approach concerning revascularization options. The AAOCA-LAD supplies the mid and distal third of the anterior wall.  If LAD is not potentially ischemia inducing, diagonal could be intervened upon.  The branch of the obtuse marginal would be more difficult.  For the time being, medical management of heart failure, monitor kidney function, risk factor modification, and and decision concerning revascularization after CT.   Coronary CT-A 05/05/18:  IMPRESSION: 1. Coronary calcium score of 3133. This was 7 percentile for age and sex matched control.  2. Normal coronary origin with left main originating  above the left coronary sinus with normal course and no interarterial course. Right coronary sinus gives rise to RCA and a medium size high RV marginal branch (considered LAD on cardiac catheterization).  3. Severe three vessel CAD with diffuse plaque and > 70% in all three coronary arteries, surgical consultation is recommended.  Patient Profile     77 y.o. male with CAD, hypertension, hyperlipidemia, diabetes, and mild aortic stenosis here with NSTEMI and acute diastolic heart failure.    Assessment & Plan     NSTEMI:  Troponin mildly elevated to 1.19.  left heart catheterization 05/04/2018 as noted above.   He had a coronary CT-A that revealed a normal coronary origin with the left main originating above the left coronary sinus.   Plan for CABG Tuesday   COntinue meds incluiding heparin   # Moderate aortic stenosis: Mean gradient 22 mmHg on echo this admission.  # Acute diastolic heart failure:  Volume status is not bad  WIll continue to follow I/O  # Hypertension:  BP is labile   Follow post op   # Hyperlipidemia:  Atorvastatin started this admission.  Check lipids/CMP in 6-8 weeks.     For questions or updates, please contact [place] Please consult [email or web address] for contact info under Cardiology/STEMI.      Signed, Dorris Carnes, MD  05/09/2018, 10:18 AM

## 2018-05-09 NOTE — Progress Notes (Signed)
Pt. Feet soaked in solution.

## 2018-05-09 NOTE — Progress Notes (Signed)
5 Days Post-Op Procedure(s) (LRB): LEFT HEART CATH AND CORONARY ANGIOGRAPHY (N/A) Subjective: Stable on IV heparin pending CABG Tuesday, June 11 Creatinine stable after multiple closures to IV contrast Plan bypass graft to LAD diagonal OM and distal RCA Tuesday.  Patient has bilateral varicosities both lower leg vein but vein mapping shows probable adequate thigh vein.  Objective: Vital signs in last 24 hours: Temp:  [97.5 F (36.4 C)-98.4 F (36.9 C)] 97.6 F (36.4 C) (06/09 1504) Pulse Rate:  [60-102] 73 (06/09 1504) Cardiac Rhythm: Normal sinus rhythm (06/09 1130) Resp:  [15-21] 21 (06/09 1504) BP: (118-176)/(72-89) 120/73 (06/09 1504) SpO2:  [91 %-100 %] 100 % (06/09 1504) Weight:  [236 lb 14.4 oz (107.5 kg)] 236 lb 14.4 oz (107.5 kg) (06/09 0700)  Hemodynamic parameters for last 24 hours:  Stable  Intake/Output from previous day: 06/08 0701 - 06/09 0700 In: 1080 [P.O.:600; I.V.:480] Out: 1950 [Urine:1950] Intake/Output this shift: Total I/O In: 840 [P.O.:840] Out: 875 [Urine:875]  Alert and comfortable Poor pedal hygiene with fungal nail infections Varicosities of both lower legs Sinus rhythm  Lab Results: Recent Labs    05/08/18 0309 05/09/18 0148  WBC 12.2* 13.0*  HGB 13.7 14.3  HCT 42.0 44.5  PLT 283 224   BMET:  Recent Labs    05/07/18 0314 05/08/18 0309  NA 135 137  K 4.0 3.7  CL 103 107  CO2 23 21*  GLUCOSE 160* 137*  BUN 20 19  CREATININE 1.28* 1.20  CALCIUM 9.0 8.5*    PT/INR: No results for input(s): LABPROT, INR in the last 72 hours. ABG No results found for: PHART, HCO3, TCO2, ACIDBASEDEF, O2SAT CBG (last 3)  Recent Labs    05/08/18 2122 05/09/18 0838 05/09/18 1248  GLUCAP 159* 163* 182*    Assessment/Plan: S/P Procedure(s) (LRB): LEFT HEART CATH AND CORONARY ANGIOGRAPHY (N/A) CABG on June 11.   LOS: 7 days    Tharon Aquas Trigt III 05/09/2018

## 2018-05-09 NOTE — Progress Notes (Signed)
ANTICOAGULATION CONSULT NOTE - Follow Up Consult  Pharmacy Consult for IV heparin Indication: CAD awaiting CABG  No Known Allergies  Patient Measurements: Height: 5\' 11"  (180.3 cm) Weight: 236 lb 14.4 oz (107.5 kg) IBW/kg (Calculated) : 75.3 Heparin Dosing Weight: 99  Vital Signs: Temp: 98 F (36.7 C) (06/09 0738) Temp Source: Oral (06/09 0738) BP: 154/89 (06/09 0738) Pulse Rate: 77 (06/09 0738)  Labs: Recent Labs    05/07/18 0314 05/08/18 0309 05/09/18 0148  HGB 14.6 13.7 14.3  HCT 44.1 42.0 44.5  PLT 315 283 224  HEPARINUNFRC 0.47 0.41 0.53  CREATININE 1.28* 1.20  --     Estimated Creatinine Clearance: 66.4 mL/min (by C-G formula based on SCr of 1.2 mg/dL).   Medications:  Infusions:  . sodium chloride    . albuterol 10 mg/hr (05/02/18 1837)  . heparin 1,800 Units/hr (05/09/18 0224)    Assessment: 77 yo male s/p cath, awaiting CABG on Tuesday. No overt bleeding or complications noted. CBC stable.  Heparin levels continue to be at goal. No issues noted.  Goal of Therapy:  Heparin level 0.3-0.7 units/ml Monitor platelets by anticoagulation protocol: Yes   Plan:  1. Continue IV Heparin at current rate. 2. Daily heparin level and CBC.  Erin Hearing PharmD., BCPS Clinical Pharmacist 05/09/2018 10:18 AM

## 2018-05-10 LAB — BLOOD GAS, ARTERIAL
Acid-base deficit: 1.4 mmol/L (ref 0.0–2.0)
Bicarbonate: 22.3 mmol/L (ref 20.0–28.0)
Drawn by: 517021
FIO2: 0.21
O2 Saturation: 97.1 %
Patient temperature: 98.6
pCO2 arterial: 34.2 mmHg (ref 32.0–48.0)
pH, Arterial: 7.429 (ref 7.350–7.450)
pO2, Arterial: 95.6 mmHg (ref 83.0–108.0)

## 2018-05-10 LAB — HEPARIN LEVEL (UNFRACTIONATED): Heparin Unfractionated: 0.52 IU/mL (ref 0.30–0.70)

## 2018-05-10 LAB — CBC
HCT: 43.4 % (ref 39.0–52.0)
Hemoglobin: 14.2 g/dL (ref 13.0–17.0)
MCH: 28.5 pg (ref 26.0–34.0)
MCHC: 32.7 g/dL (ref 30.0–36.0)
MCV: 87.1 fL (ref 78.0–100.0)
PLATELETS: 272 10*3/uL (ref 150–400)
RBC: 4.98 MIL/uL (ref 4.22–5.81)
RDW: 15.1 % (ref 11.5–15.5)
WBC: 11.4 10*3/uL — ABNORMAL HIGH (ref 4.0–10.5)

## 2018-05-10 LAB — COMPREHENSIVE METABOLIC PANEL
ALT: 22 U/L (ref 17–63)
AST: 19 U/L (ref 15–41)
Albumin: 3.7 g/dL (ref 3.5–5.0)
Alkaline Phosphatase: 86 U/L (ref 38–126)
Anion gap: 9 (ref 5–15)
BUN: 19 mg/dL (ref 6–20)
CO2: 23 mmol/L (ref 22–32)
Calcium: 9.1 mg/dL (ref 8.9–10.3)
Chloride: 104 mmol/L (ref 101–111)
Creatinine, Ser: 1.22 mg/dL (ref 0.61–1.24)
GFR calc Af Amer: >60 mL/min (ref >60)
GFR calc non Af Amer: 56 mL/min — ABNORMAL LOW (ref >60)
Glucose, Bld: 130 mg/dL — ABNORMAL HIGH (ref 65–99)
Potassium: 4 mmol/L (ref 3.5–5.1)
Sodium: 136 mmol/L (ref 135–145)
Total Bilirubin: 1.1 mg/dL (ref 0.3–1.2)
Total Protein: 6.9 g/dL (ref 6.5–8.1)

## 2018-05-10 LAB — GLUCOSE, CAPILLARY
GLUCOSE-CAPILLARY: 156 mg/dL — AB (ref 65–99)
GLUCOSE-CAPILLARY: 157 mg/dL — AB (ref 65–99)
GLUCOSE-CAPILLARY: 125 mg/dL — AB (ref 65–99)
Glucose-Capillary: 167 mg/dL — ABNORMAL HIGH (ref 65–99)

## 2018-05-10 LAB — TYPE AND SCREEN
ABO/RH(D): A POS
Antibody Screen: NEGATIVE

## 2018-05-10 LAB — ABO/RH: ABO/RH(D): A POS

## 2018-05-10 MED ORDER — METOPROLOL TARTRATE 12.5 MG HALF TABLET
12.5000 mg | ORAL_TABLET | Freq: Once | ORAL | Status: AC
  Administered 2018-05-11: 12.5 mg via ORAL
  Filled 2018-05-10: qty 1

## 2018-05-10 MED ORDER — VANCOMYCIN HCL 10 G IV SOLR: 1500.0000 mg | INTRAVENOUS | Status: DC

## 2018-05-10 MED ORDER — TRANEXAMIC ACID (OHS) BOLUS VIA INFUSION
15.0000 mg/kg | INTRAVENOUS | Status: DC
  Filled 2018-05-10: qty 1613

## 2018-05-10 MED ORDER — SODIUM CHLORIDE 0.9 % IV SOLN: INTRAVENOUS | Status: DC

## 2018-05-10 MED ORDER — DEXTROSE 5 % IV SOLN
0.0000 ug/min | INTRAVENOUS | Status: DC
  Filled 2018-05-10: qty 4

## 2018-05-10 MED ORDER — CHLORHEXIDINE GLUCONATE 0.12 % MT SOLN
15.0000 mL | Freq: Once | OROMUCOSAL | Status: DC
  Filled 2018-05-10: qty 15

## 2018-05-10 MED ORDER — SODIUM CHLORIDE 0.9 % IV SOLN
1.5000 g | INTRAVENOUS | Status: AC
  Administered 2018-05-11: 1.5 g via INTRAVENOUS
  Filled 2018-05-10: qty 1.5

## 2018-05-10 MED ORDER — DOPAMINE-DEXTROSE 3.2-5 MG/ML-% IV SOLN: 0.0000 ug/kg/min | INTRAVENOUS | Status: DC

## 2018-05-10 MED ORDER — DEXMEDETOMIDINE HCL IN NACL 400 MCG/100ML IV SOLN: 0.1000 ug/kg/h | INTRAVENOUS | Status: DC

## 2018-05-10 MED ORDER — CHLORHEXIDINE GLUCONATE 4 % EX LIQD
60.0000 mL | Freq: Once | CUTANEOUS | Status: AC
  Administered 2018-05-11: 4 via TOPICAL

## 2018-05-10 MED ORDER — SODIUM CHLORIDE 0.9 % IV SOLN
INTRAVENOUS | Status: DC
  Filled 2018-05-10: qty 30

## 2018-05-10 MED ORDER — DEXMEDETOMIDINE HCL IN NACL 400 MCG/100ML IV SOLN
0.1000 ug/kg/h | INTRAVENOUS | Status: DC
  Filled 2018-05-10: qty 100

## 2018-05-10 MED ORDER — POTASSIUM CHLORIDE 2 MEQ/ML IV SOLN
80.0000 meq | INTRAVENOUS | Status: DC
  Filled 2018-05-10: qty 40

## 2018-05-10 MED ORDER — POTASSIUM CHLORIDE 2 MEQ/ML IV SOLN: 80.0000 meq | INTRAVENOUS | Status: DC

## 2018-05-10 MED ORDER — CHLORHEXIDINE GLUCONATE 4 % EX LIQD
60.0000 mL | Freq: Once | CUTANEOUS | Status: AC
  Administered 2018-05-10: 4 via TOPICAL

## 2018-05-10 MED ORDER — SODIUM CHLORIDE 0.9 % IV SOLN
30.0000 ug/min | INTRAVENOUS | Status: DC
  Filled 2018-05-10: qty 2

## 2018-05-10 MED ORDER — TRANEXAMIC ACID (OHS) PUMP PRIME SOLUTION: 2.0000 mg/kg | INTRAVENOUS | Status: DC

## 2018-05-10 MED ORDER — TRANEXAMIC ACID 1000 MG/10ML IV SOLN: 1.5000 mg/kg/h | INTRAVENOUS | Status: DC

## 2018-05-10 MED ORDER — DOCUSATE SODIUM 100 MG PO CAPS
100.0000 mg | ORAL_CAPSULE | Freq: Every day | ORAL | Status: DC
  Administered 2018-05-10: 100 mg via ORAL

## 2018-05-10 MED ORDER — MAGNESIUM SULFATE 50 % IJ SOLN
40.0000 meq | INTRAMUSCULAR | Status: DC
Start: 2018-05-11 — End: 2018-05-10

## 2018-05-10 MED ORDER — TEMAZEPAM 15 MG PO CAPS: 15.0000 mg | ORAL_CAPSULE | Freq: Once | ORAL | Status: DC | PRN

## 2018-05-10 MED ORDER — SODIUM CHLORIDE 0.9 % IV SOLN
750.0000 mg | INTRAVENOUS | Status: DC
  Filled 2018-05-10: qty 750

## 2018-05-10 MED ORDER — PLASMA-LYTE 148 IV SOLN: INTRAVENOUS | Status: DC

## 2018-05-10 MED ORDER — SODIUM CHLORIDE 0.9 % IV SOLN
INTRAVENOUS | Status: DC
  Filled 2018-05-10: qty 1

## 2018-05-10 MED ORDER — PAPAVERINE HCL 30 MG/ML IJ SOLN
INTRAMUSCULAR | Status: AC
  Administered 2018-05-11: 12:00:00
  Filled 2018-05-10 (×2): qty 2.5

## 2018-05-10 MED ORDER — SODIUM CHLORIDE 0.9 % IV SOLN: 30.0000 ug/min | INTRAVENOUS | Status: DC

## 2018-05-10 MED ORDER — SODIUM CHLORIDE 0.9 % IV SOLN: 1.5000 g | INTRAVENOUS | Status: DC

## 2018-05-10 MED ORDER — DOPAMINE-DEXTROSE 3.2-5 MG/ML-% IV SOLN
0.0000 ug/kg/min | INTRAVENOUS | Status: DC
  Filled 2018-05-10: qty 250

## 2018-05-10 MED ORDER — DIAZEPAM 5 MG PO TABS
5.0000 mg | ORAL_TABLET | Freq: Once | ORAL | Status: AC
  Administered 2018-05-11: 5 mg via ORAL
  Filled 2018-05-10: qty 1

## 2018-05-10 MED ORDER — EPINEPHRINE PF 1 MG/ML IJ SOLN: 0.0000 ug/min | INTRAVENOUS | Status: DC

## 2018-05-10 MED ORDER — MAGNESIUM SULFATE 50 % IJ SOLN
40.0000 meq | INTRAMUSCULAR | Status: DC
  Filled 2018-05-10: qty 9.85

## 2018-05-10 MED ORDER — SODIUM CHLORIDE 0.9 % IV SOLN: 750.0000 mg | INTRAVENOUS | Status: DC

## 2018-05-10 MED ORDER — VANCOMYCIN HCL 10 G IV SOLR
1500.0000 mg | INTRAVENOUS | Status: AC
  Administered 2018-05-11: 1500 mg via INTRAVENOUS
  Filled 2018-05-10: qty 1500

## 2018-05-10 MED ORDER — NITROGLYCERIN IN D5W 200-5 MCG/ML-% IV SOLN: 2.0000 ug/min | INTRAVENOUS | Status: DC

## 2018-05-10 MED ORDER — TRANEXAMIC ACID (OHS) PUMP PRIME SOLUTION
2.0000 mg/kg | INTRAVENOUS | Status: DC
  Filled 2018-05-10: qty 2.15

## 2018-05-10 MED ORDER — NITROGLYCERIN IN D5W 200-5 MCG/ML-% IV SOLN
2.0000 ug/min | INTRAVENOUS | Status: AC
  Administered 2018-05-11: 10 ug/min via INTRAVENOUS
  Filled 2018-05-10: qty 250

## 2018-05-10 MED ORDER — SODIUM CHLORIDE 0.9 % IV SOLN
1.5000 mg/kg/h | INTRAVENOUS | Status: DC
  Filled 2018-05-10: qty 25

## 2018-05-10 MED ORDER — MILRINONE LACTATE IN DEXTROSE 20-5 MG/100ML-% IV SOLN
0.1250 ug/kg/min | INTRAVENOUS | Status: DC
  Filled 2018-05-10: qty 100

## 2018-05-10 MED ORDER — BISACODYL 5 MG PO TBEC
5.0000 mg | DELAYED_RELEASE_TABLET | Freq: Once | ORAL | Status: AC
  Administered 2018-05-10: 5 mg via ORAL
  Filled 2018-05-10: qty 1

## 2018-05-10 NOTE — Progress Notes (Signed)
CARDIAC REHAB PHASE I   PRE:  Rate/Rhythm: 82 SR  BP:  Supine:   Sitting: 139/96  Standing:    SaO2: 97%RA  MODE:  Ambulation: 940 ft   POST:  Rate/Rhythm: 92 SR  BP:  Supine:   Sitting: 125/75  Standing:    SaO2: 98%RA 0925-0950 Pt walked once already. Pt walked 940 ft on RA with steady gait. He tolerated well. Demonstrated 1250 ml on IS. Encouraged to do IS ten times an hour.   Graylon Good, RN BSN  05/10/2018 9:46 AM

## 2018-05-10 NOTE — Progress Notes (Addendum)
Progress Note  Patient Name: Alejandro [name] Date of Encounter: 05/10/2018  Primary Cardiologist: Rozann Lesches, MD   Subjective    No chest pain or shortness of breath. Anticipating CABG tomorrow, 06/11/9.   Inpatient Medications    Scheduled Meds: . albuterol  5 mg Nebulization Once  . aspirin  81 mg Oral Daily  . atorvastatin  80 mg Oral q1800  . bisacodyl  5 mg Oral Once  . chlorhexidine  60 mL Topical Once   And  . [START ON 05/11/2018] chlorhexidine  60 mL Topical Once  . chlorhexidine   Topical BID  . [START ON 05/11/2018] chlorhexidine  15 mL Mouth/Throat Once  . [START ON 05/11/2018] diazepam  5 mg Oral Once  . doxycycline  100 mg Oral Q12H  . furosemide  40 mg Oral BID  . [START ON 05/11/2018] heparin-papaverine-plasmalyte irrigation   Irrigation To OR  . insulin aspart  0-15 Units Subcutaneous TID WC  . insulin aspart  0-5 Units Subcutaneous QHS  . isosorbide-hydrALAZINE  1 tablet Oral BID  . losartan  50 mg Oral Daily  . [START ON 05/11/2018] magnesium sulfate  40 mEq Other To OR  . [START ON 05/11/2018] metoprolol tartrate  12.5 mg Oral Once  . nebivolol  5 mg Oral Daily  . nystatin-triamcinolone   Topical BID  . pantoprazole  40 mg Oral Daily  . [START ON 05/11/2018] potassium chloride  80 mEq Other To OR  . potassium chloride  20 mEq Oral BID  . sodium chloride flush  3 mL Intravenous Q12H  . [START ON 05/11/2018] tranexamic acid  15 mg/kg Intravenous To OR  . [START ON 05/11/2018] tranexamic acid  2 mg/kg Intracatheter To OR   Continuous Infusions: . sodium chloride    . albuterol 10 mg/hr (05/02/18 1837)  . [START ON 05/11/2018] cefUROXime (ZINACEF)  IV    . [START ON 05/11/2018] dexmedetomidine    . [START ON 05/11/2018] DOPamine    . [START ON 05/11/2018] epinephrine    . [START ON 05/11/2018] heparin 30,000 units/NS 1000 mL solution for CELLSAVER    . heparin 1,800 Units/hr (05/09/18 0224)  . [START ON 05/11/2018] insulin (NOVOLIN-R) infusion    . [START  ON 05/11/2018] nitroGLYCERIN    . [START ON 05/11/2018] phenylephrine 20mg /288mL NS (0.08mg /ml) infusion    . [START ON 05/11/2018] tranexamic acid (CYKLOKAPRON) infusion ([facility])     PRN Meds: sodium chloride, acetaminophen, ALPRAZolam, ondansetron (ZOFRAN) IV, oxyCODONE, sodium chloride flush, temazepam   Vital Signs    Vitals:   05/09/18 1504 05/09/18 1938 05/09/18 2344 05/10/18 0722  BP: 120/73 (!) 149/73 126/60   Pulse: 73 76 63   Resp: (!) 21 (!) 21 18   Temp: 97.6 F (36.4 C) 97.8 F (36.6 C) (!) 97.5 F (36.4 C) 98.1 F (36.7 C)  TempSrc: Oral Oral Axillary Oral  SpO2: 100% 100% 99%   Weight:      Height:        Intake/Output Summary (Last 24 hours) at 05/10/2018 0842 Last data filed at 05/10/2018 0700 Gross per 24 hour  Intake 1320 ml  Output 1300 ml  Net 20 ml   Neg 3.17 L   Filed Weights   05/07/18 0300 05/08/18 0500 05/09/18 0700  Weight: 109.4 kg (241 lb 2.9 oz) 108.7 kg (239 lb 11.2 oz) 107.5 kg (236 lb 14.4 oz)    Telemetry    NSR HR 75 - Personally Reviewed  ECG    05/03/18: Sinus  tachycardia.  Rate 111 bpm.  Frequent PVCs.  Inferolateral ST depression - Personally Reviewed  Physical Exam   VS:  BP 126/60   Pulse 63   Temp 98.1 F (36.7 C) (Oral)   Resp 18   Ht 5\' 11"  (1.803 m)   Wt 107.5 kg (236 lb 14.4 oz)   SpO2 99%   BMI 33.04 kg/m  , BMI Body mass index is 33.04 kg/m.  General: Well developed, well nourished, NAD Head: Normocephalic, atraumatic, clear, moist mucus membranes. Neck: . No JVD Lungs:Clear to ausculation bilaterally. No wheezes, rales, or rhonchi. Breathing is unlabored. Cardiovascular: RRR with S1 S2. Gr II/VI systolic murmur  No, rubs, gallops, or LV heave appreciated. Abdomen: Soft, non-tender, non-distended with normoactive bowel sounds.  No obvious abdominal masses. MSK: Strength and tone appear normal for age. 5/5 in all extremities Extremities: No edema. No clubbing or cyanosis. DP/PT pulses 2+ bilaterally Neuro:  Alert and oriented. No focal deficits. No facial asymmetry. MAE spontaneously. Psych: Responds to questions appropriately with normal affect.    Labs    Chemistry Recent Labs  Lab 05/07/18 0314 05/08/18 0309 05/10/18 0217  NA 135 137 136  K 4.0 3.7 4.0  CL 103 107 104  CO2 23 21* 23  GLUCOSE 160* 137* 130*  BUN 20 19 19   CREATININE 1.28* 1.20 1.22  CALCIUM 9.0 8.5* 9.1  PROT  --  6.6 6.9  ALBUMIN  --  3.4* 3.7  AST  --  21 19  ALT  --  25 22  ALKPHOS  --  80 86  BILITOT  --  0.9 1.1  GFRNONAA 53* 57* 56*  GFRAA >60 >60 >60  ANIONGAP 9 9 9      Hematology Recent Labs  Lab 05/08/18 0309 05/09/18 0148 05/10/18 0217  WBC 12.2* 13.0* 11.4*  RBC 4.74 4.87 4.98  HGB 13.7 14.3 14.2  HCT 42.0 44.5 43.4  MCV 88.6 91.4 87.1  MCH 28.9 29.4 28.5  MCHC 32.6 32.1 32.7  RDW 15.4 15.5 15.1  PLT 283 224 272   Cardiac Enzymes Recent Labs  Lab 05/03/18 1014  TROPONINI 1.19*   No results for input(s): TROPIPOC in the last 168 hours.   BNP No results for input(s): BNP, PROBNP in the last 168 hours.   DDimer  No results for input(s): DDIMER in the last 168 hours.   Radiology    No results found.  Cardiac Studies   Echo 05/03/18: Study Conclusions  - Left ventricle: The cavity size was moderately dilated. Wall   thickness was increased in a pattern of mild LVH. Systolic   function was normal. The estimated ejection fraction was in the   range of 50% to 55%. There is mild hypokinesis of the   basal-midinferior myocardium. Features are consistent with a   pseudonormal left ventricular filling pattern, with concomitant   abnormal relaxation and increased filling pressure (grade 2   diastolic dysfunction). - Aortic valve: Mildly to moderately calcified annulus. Trileaflet;   moderately calcified leaflets. Noncoronary cusp mobility was   restricted. There was mild stenosis. There was trivial   regurgitation. Mean gradient (S): 22 mm Hg. Peak gradient (S): 39   mm Hg.  VTI ratio of LVOT to aortic valve: 0.42. Valve area (VTI):   2.08 cm^2. Valve area (Vmax): 1.9 cm^2. Valve area (Vmean): 1.86   cm^2. - Mitral valve: Moderately calcified annulus. There was mild   regurgitation. - Left atrium: The atrium was moderately dilated. - Right atrium:  Central venous pressure (est): 8 mm Hg. - Atrial septum: No defect or patent foramen ovale was identified. - Tricuspid valve: There was trivial regurgitation. - Pulmonary arteries: Systolic pressure was moderately increased.   PA peak pressure: 50 mm Hg (S). - Coronary sinus: The vessel was dilated. - Pericardium, extracardiac: There was no pericardial effusion.  LHC 05/04/18:   Technically difficult procedure to complete due to subclavian and innominate artery tortuosity.  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  The vessel contains diffuse luminal irregularities from the proximal to the distal third of the vessel.  No focal high-grade obstruction is noted.  Need to exclude course between pulmonary artery and aorta as well as slitlike orifice/intramural course.  First diagonal, which arises from the left main, contains calcified segmental 95% stenosis beyond the septal perforator.  There is also a large septal perforator that arises from the diagonal.  Circumflex coronary artery gives origin to a trifurcating obtuse marginal #1 with the first branch of the trifurcation containing 95 to 99% stenosis.  This branch is small to moderate in size.  Continuation of the circumflex beyond the first obtuse marginal contains 70% stenosis before ending on a very tiny second obtuse marginal branch.  The left main is widely patent.  The right coronary artery is dominant and contains 50 to 60% distal stenosis, PDA contains 75% ostial stenosis, first LV branch contains segmental 80% stenosis, and large second left ventricular branch  contains segmental 60 to 70% proximal narrowing.  Overall LV function could not be assessed by the study.  EDP is markedly elevated at 26 mmHg suggesting an acute on chronic diastolic heart failure process given echo EF of 55%.  RECOMMENDATIONS:   Coronary CT angio to evaluate the course and ostium of the LAD.  If there are high risk features, should consider surgical consult and heart team approach concerning revascularization options. The AAOCA-LAD supplies the mid and distal third of the anterior wall.  If LAD is not potentially ischemia inducing, diagonal could be intervened upon.  The branch of the obtuse marginal would be more difficult.  For the time being, medical management of heart failure, monitor kidney function, risk factor modification, and and decision concerning revascularization after CT.  Coronary CT-A 05/05/18:  IMPRESSION: 1. Coronary calcium score of 3133. This was 19 percentile for age and sex matched control.  2. Normal coronary origin with left main originating above the left coronary sinus with normal course and no interarterial course. Right coronary sinus gives rise to RCA and a medium size high RV marginal branch (considered LAD on cardiac catheterization).  3. Severe three vessel CAD with diffuse plaque and > 70% in all three coronary arteries, surgical consultation is recommended.  Patient Profile     77 y.o. male with CAD, hypertension, hyperlipidemia, diabetes, and mild aortic stenosis here with NSTEMI and acute diastolic heart failure.    Assessment & Plan     1. NSTEMI:  -Troponin mildly elevated to peak of 1.19.  LHC 05/04/2018 which revealed severe three vessel disease with subsequent TCTS consulted -He underwent a coronary CTA on 05/05/18 which revealed a normal coronary origin with the left main originating above the left coronary sinus.   -Plan for CABG Tuesday 05/11/18   -Denies chest pain  -Continue ASA, statin, BB,  heparin gtt  2.  Moderate aortic stenosis:  -Mean gradient 22 mmHg per echocardiogram -Will continue  to monitor    3. Acute diastolic heart failure:  -Per echocardiogram LVEF of 50% to 55% with mild hypokinesis of the basal-midinferior myocardium consistent with a pseudonormal left ventricular filling pattern, with concomitant abnormal relaxation and increased filling pressure (grade 2 diastolic dysfunction) -Weight, 236lb today, 266lb on admission  -I&O, net negative 4.7L since admission, 24H total output 1.6L -Continue PO Lasix 40, losartan   4. HTN: -Stable, 128/71>126/60>149/73>120/73 -Will follow post-operatively, continue current regimen >losartan, metoprolol   5. HLD: -Stable, continue atorvastatin>>started this admission   -Will need lipids and LFT's in 6 weeks   For questions or updates, please contact [place] Please consult [email or web address] for contact info under Cardiology/STEMI.    Pt seen and examined   Lungs are CTA  Cardiac RRR   II/VI systolic murmur   No S3  Ext are without edema  Plan as noted above for CABG in AM  Continue on heparin for now.      Signed, Dorris Carnes, MD  05/10/2018, 8:42 AM

## 2018-05-10 NOTE — Progress Notes (Signed)
ANTICOAGULATION CONSULT NOTE - Follow Up Consult  Pharmacy Consult for IV heparin Indication: CAD awaiting CABG  No Known Allergies  Patient Measurements: Height: 5\' 11"  (180.3 cm) Weight: 236 lb 14.4 oz (107.5 kg) IBW/kg (Calculated) : 75.3 Heparin Dosing Weight: 99  Vital Signs: Temp: 98.1 F (36.7 C) (06/10 0722) Temp Source: Oral (06/10 0722) BP: 126/60 (06/09 2344) Pulse Rate: 63 (06/09 2344)  Labs: Recent Labs    05/08/18 0309 05/09/18 0148 05/10/18 0217  HGB 13.7 14.3 14.2  HCT 42.0 44.5 43.4  PLT 283 224 272  HEPARINUNFRC 0.41 0.53 0.52  CREATININE 1.20  --  1.22    Estimated Creatinine Clearance: 65.3 mL/min (by C-G formula based on SCr of 1.22 mg/dL).  Assessment: 77 yo male s/p cath, awaiting CABG on Tuesday- heart pack orders have been entered. No overt bleeding or complications noted. CBC stable.  Heparin levels continue to be at goal. No issues noted.  Goal of Therapy:  Heparin level 0.3-0.7 units/ml Monitor platelets by anticoagulation protocol: Yes   Plan:  Continue IV Heparin at 1800 units/hr Daily heparin level and CBC. For CABG tomorrow  Ailin Rochford D. Ximenna Fonseca, PharmD, BCPS Clinical Pharmacist [phone] Please check AMION for all [place] numbers 05/10/2018 8:37 AM

## 2018-05-10 NOTE — Anesthesia Preprocedure Evaluation (Addendum)
Anesthesia Evaluation  Patient identified by MRN, date of birth, ID band Patient awake    Reviewed: Allergy & Precautions, H&P , NPO status , Patient's Chart, lab work & pertinent test results  Airway Mallampati: III  TM Distance: >3 FB Neck ROM: Full    Dental no notable dental hx. (+) Teeth Intact, Dental Advisory Given   Pulmonary neg pulmonary ROS,    Pulmonary exam normal breath sounds clear to auscultation       Cardiovascular Exercise Tolerance: Good hypertension, + CAD, + Past MI and +CHF  + Valvular Problems/Murmurs AS  Rhythm:Regular Rate:Normal     Neuro/Psych negative neurological ROS  negative psych ROS   GI/Hepatic Neg liver ROS, GERD  Controlled,  Endo/Other  negative endocrine ROSdiabetes, Type 2, Oral Hypoglycemic Agents  Renal/GU Renal disease  negative genitourinary   Musculoskeletal   Abdominal   Peds  Hematology negative hematology ROS (+)   Anesthesia Other Findings   Reproductive/Obstetrics negative OB ROS                            Anesthesia Physical Anesthesia Plan  ASA: IV  Anesthesia Plan: General   Post-op Pain Management:    Induction: Intravenous  PONV Risk Score and Plan: 2 and Midazolam and Treatment may vary due to age or medical condition  Airway Management Planned: Oral ETT  Additional Equipment: Arterial line, CVP, PA Cath, TEE and Ultrasound Guidance Line Placement  Intra-op Plan:   Post-operative Plan: Post-operative intubation/ventilation  Informed Consent: I have reviewed the patients History and Physical, chart, labs and discussed the procedure including the risks, benefits and alternatives for the proposed anesthesia with the patient or authorized representative who has indicated his/her understanding and acceptance.   Dental advisory given  Plan Discussed with: CRNA  Anesthesia Plan Comments:         Anesthesia Quick  Evaluation

## 2018-05-11 ENCOUNTER — Inpatient Hospital Stay (HOSPITAL_COMMUNITY): Payer: Medicare Other | Admitting: Certified Registered"

## 2018-05-11 ENCOUNTER — Inpatient Hospital Stay (HOSPITAL_COMMUNITY): Payer: Medicare Other

## 2018-05-11 ENCOUNTER — Encounter (HOSPITAL_COMMUNITY): Payer: Self-pay | Admitting: Urology

## 2018-05-11 ENCOUNTER — Encounter (HOSPITAL_COMMUNITY)
Admission: EM | Disposition: A | Discharge status: Home / Self Care | Payer: Self-pay | Source: Home / Self Care | Attending: Cardiovascular Disease

## 2018-05-11 DIAGNOSIS — Z951 Presence of aortocoronary bypass graft: Secondary | ICD-10-CM

## 2018-05-11 HISTORY — PX: TEE WITHOUT CARDIOVERSION: SHX5443

## 2018-05-11 HISTORY — PX: CORONARY ARTERY BYPASS GRAFT: SHX141

## 2018-05-11 LAB — POCT I-STAT, CHEM 8
BUN: 16 mg/dL (ref 6–20)
BUN: 14 mg/dL (ref 6–20)
BUN: 16 mg/dL (ref 6–20)
BUN: 15 mg/dL (ref 6–20)
BUN: 15 mg/dL (ref 6–20)
BUN: 16 mg/dL (ref 6–20)
CALCIUM ION: 1.15 mmol/L (ref 1.15–1.40)
CALCIUM ION: 1.22 mmol/L (ref 1.15–1.40)
CALCIUM ION: 1.21 mmol/L (ref 1.15–1.40)
CHLORIDE: 100 mmol/L — AB (ref 101–111)
CREATININE: 0.9 mg/dL (ref 0.61–1.24)
Calcium, Ion: 1.05 mmol/L — ABNORMAL LOW (ref 1.15–1.40)
Calcium, Ion: 1.14 mmol/L — ABNORMAL LOW (ref 1.15–1.40)
Calcium, Ion: 1.17 mmol/L (ref 1.15–1.40)
Chloride: 103 mmol/L (ref 101–111)
Chloride: 100 mmol/L — ABNORMAL LOW (ref 101–111)
Chloride: 103 mmol/L (ref 101–111)
Chloride: 100 mmol/L — ABNORMAL LOW (ref 101–111)
Chloride: 101 mmol/L (ref 101–111)
Creatinine, Ser: 0.9 mg/dL (ref 0.61–1.24)
Creatinine, Ser: 0.7 mg/dL (ref 0.61–1.24)
Creatinine, Ser: 0.9 mg/dL (ref 0.61–1.24)
Creatinine, Ser: 0.8 mg/dL (ref 0.61–1.24)
Creatinine, Ser: 0.8 mg/dL (ref 0.61–1.24)
GLUCOSE: 152 mg/dL — AB (ref 65–99)
GLUCOSE: 163 mg/dL — AB (ref 65–99)
Glucose, Bld: 119 mg/dL — ABNORMAL HIGH (ref 65–99)
Glucose, Bld: 135 mg/dL — ABNORMAL HIGH (ref 65–99)
Glucose, Bld: 203 mg/dL — ABNORMAL HIGH (ref 65–99)
Glucose, Bld: 200 mg/dL — ABNORMAL HIGH (ref 65–99)
HCT: 41 % (ref 39.0–52.0)
HCT: 37 % — ABNORMAL LOW (ref 39.0–52.0)
HEMATOCRIT: 33 % — AB (ref 39.0–52.0)
HEMATOCRIT: 34 % — AB (ref 39.0–52.0)
HEMATOCRIT: 35 % — AB (ref 39.0–52.0)
HEMATOCRIT: 34 % — AB (ref 39.0–52.0)
HEMOGLOBIN: 11.2 g/dL — AB (ref 13.0–17.0)
HEMOGLOBIN: 11.6 g/dL — AB (ref 13.0–17.0)
HEMOGLOBIN: 11.6 g/dL — AB (ref 13.0–17.0)
Hemoglobin: 13.9 g/dL (ref 13.0–17.0)
Hemoglobin: 12.6 g/dL — ABNORMAL LOW (ref 13.0–17.0)
Hemoglobin: 11.9 g/dL — ABNORMAL LOW (ref 13.0–17.0)
POTASSIUM: 4.5 mmol/L (ref 3.5–5.1)
Potassium: 4.2 mmol/L (ref 3.5–5.1)
Potassium: 4.3 mmol/L (ref 3.5–5.1)
Potassium: 4.3 mmol/L (ref 3.5–5.1)
Potassium: 4.7 mmol/L (ref 3.5–5.1)
Potassium: 4.3 mmol/L (ref 3.5–5.1)
SODIUM: 138 mmol/L (ref 135–145)
SODIUM: 137 mmol/L (ref 135–145)
SODIUM: 139 mmol/L (ref 135–145)
SODIUM: 137 mmol/L (ref 135–145)
Sodium: 137 mmol/L (ref 135–145)
Sodium: 137 mmol/L (ref 135–145)
TCO2: 23 mmol/L (ref 22–32)
TCO2: 25 mmol/L (ref 22–32)
TCO2: 26 mmol/L (ref 22–32)
TCO2: 25 mmol/L (ref 22–32)
TCO2: 22 mmol/L (ref 22–32)
TCO2: 24 mmol/L (ref 22–32)

## 2018-05-11 LAB — POCT I-STAT 3, ART BLOOD GAS (G3+)
Acid-base deficit: 3 mmol/L — ABNORMAL HIGH (ref 0.0–2.0)
Bicarbonate: 25.3 mmol/L (ref 20.0–28.0)
Bicarbonate: 24.1 mmol/L (ref 20.0–28.0)
O2 SAT: 100 %
O2 SAT: 100 %
PCO2 ART: 48.8 mmHg — AB (ref 32.0–48.0)
PO2 ART: 295 mmHg — AB (ref 83.0–108.0)
TCO2: 27 mmol/L (ref 22–32)
TCO2: 26 mmol/L (ref 22–32)
pCO2 arterial: 44.3 mmHg (ref 32.0–48.0)
pH, Arterial: 7.364 (ref 7.350–7.450)
pH, Arterial: 7.301 — ABNORMAL LOW (ref 7.350–7.450)
pO2, Arterial: 338 mmHg — ABNORMAL HIGH (ref 83.0–108.0)

## 2018-05-11 LAB — GLUCOSE, CAPILLARY
GLUCOSE-CAPILLARY: 140 mg/dL — AB (ref 65–99)
Glucose-Capillary: 152 mg/dL — ABNORMAL HIGH (ref 65–99)
Glucose-Capillary: 166 mg/dL — ABNORMAL HIGH (ref 65–99)
Glucose-Capillary: 166 mg/dL — ABNORMAL HIGH (ref 65–99)
Glucose-Capillary: 150 mg/dL — ABNORMAL HIGH (ref 65–99)

## 2018-05-11 LAB — CBC
HEMATOCRIT: 35 % — AB (ref 39.0–52.0)
Hemoglobin: 11.5 g/dL — ABNORMAL LOW (ref 13.0–17.0)
MCH: 28.8 pg (ref 26.0–34.0)
MCHC: 32.9 g/dL (ref 30.0–36.0)
MCV: 87.7 fL (ref 78.0–100.0)
Platelets: 176 10*3/uL (ref 150–400)
RBC: 3.99 MIL/uL — ABNORMAL LOW (ref 4.22–5.81)
RDW: 14.7 % (ref 11.5–15.5)
WBC: 23.6 10*3/uL — AB (ref 4.0–10.5)

## 2018-05-11 LAB — APTT: APTT: 31 s (ref 24–36)

## 2018-05-11 LAB — HEMOGLOBIN AND HEMATOCRIT, BLOOD
HCT: 33.9 % — ABNORMAL LOW (ref 39.0–52.0)
Hemoglobin: 11.3 g/dL — ABNORMAL LOW (ref 13.0–17.0)

## 2018-05-11 LAB — PLATELET COUNT: Platelets: 186 10*3/uL (ref 150–400)

## 2018-05-11 LAB — SURGICAL PCR SCREEN
MRSA, PCR: NEGATIVE
STAPHYLOCOCCUS AUREUS: NEGATIVE

## 2018-05-11 LAB — PROTIME-INR
INR: 1.31
Prothrombin Time: 16.2 seconds — ABNORMAL HIGH (ref 11.4–15.2)

## 2018-05-11 SURGERY — CORONARY ARTERY BYPASS GRAFTING (CABG)
Anesthesia: General | Site: Chest

## 2018-05-11 MED ORDER — ALBUMIN HUMAN 5 % IV SOLN: 250.0000 mL | INTRAVENOUS | Status: AC | PRN

## 2018-05-11 MED ORDER — SODIUM CHLORIDE 0.9 % IV SOLN
INTRAVENOUS | Status: DC | PRN
  Administered 2018-05-11: 750 mg via INTRAVENOUS

## 2018-05-11 MED ORDER — HEPARIN SODIUM (PORCINE) 1000 UNIT/ML IJ SOLN
INTRAMUSCULAR | Status: AC
  Filled 2018-05-11: qty 1

## 2018-05-11 MED ORDER — SODIUM BICARBONATE 8.4 % IV SOLN
25.0000 meq | Freq: Once | INTRAVENOUS | Status: AC
  Administered 2018-05-11: 25 meq via INTRAVENOUS

## 2018-05-11 MED ORDER — DOCUSATE SODIUM 100 MG PO CAPS
200.0000 mg | ORAL_CAPSULE | Freq: Every day | ORAL | Status: DC
  Administered 2018-05-12 – 2018-05-15 (×4): 200 mg via ORAL
  Filled 2018-05-11 (×5): qty 2

## 2018-05-11 MED ORDER — PROTAMINE SULFATE 10 MG/ML IV SOLN
INTRAVENOUS | Status: DC | PRN
  Administered 2018-05-11: 75 mg via INTRAVENOUS
  Administered 2018-05-11: 50 mg via INTRAVENOUS
  Administered 2018-05-11: 25 mg via INTRAVENOUS
  Administered 2018-05-11: 80 mg via INTRAVENOUS
  Administered 2018-05-11: 50 mg via INTRAVENOUS
  Administered 2018-05-11: 10 mg via INTRAVENOUS
  Administered 2018-05-11: 50 mg via INTRAVENOUS
  Administered 2018-05-11: 10 mg via INTRAVENOUS

## 2018-05-11 MED ORDER — LACTATED RINGERS IV SOLN: INTRAVENOUS | Status: DC

## 2018-05-11 MED ORDER — SODIUM CHLORIDE 0.9 % IV SOLN: INTRAVENOUS | Status: DC

## 2018-05-11 MED ORDER — PANTOPRAZOLE SODIUM 40 MG PO TBEC
40.0000 mg | DELAYED_RELEASE_TABLET | Freq: Every day | ORAL | Status: DC
  Administered 2018-05-13 – 2018-05-17 (×5): 40 mg via ORAL
  Filled 2018-05-11 (×5): qty 1

## 2018-05-11 MED ORDER — OXYCODONE HCL 5 MG PO TABS
5.0000 mg | ORAL_TABLET | ORAL | Status: DC | PRN
  Administered 2018-05-12: 5 mg via ORAL
  Administered 2018-05-12 – 2018-05-13 (×3): 10 mg via ORAL
  Filled 2018-05-11 (×2): qty 2
  Filled 2018-05-11: qty 1
  Filled 2018-05-11: qty 2

## 2018-05-11 MED ORDER — ASPIRIN 81 MG PO CHEW
324.0000 mg | CHEWABLE_TABLET | Freq: Every day | ORAL | Status: DC
  Administered 2018-05-15: 324 mg
  Filled 2018-05-11 (×2): qty 4

## 2018-05-11 MED ORDER — SUCCINYLCHOLINE CHLORIDE 200 MG/10ML IV SOSY
PREFILLED_SYRINGE | INTRAVENOUS | Status: AC
  Filled 2018-05-11: qty 10

## 2018-05-11 MED ORDER — VANCOMYCIN HCL IN DEXTROSE 1-5 GM/200ML-% IV SOLN
1000.0000 mg | Freq: Once | INTRAVENOUS | Status: DC
  Filled 2018-05-11: qty 200

## 2018-05-11 MED ORDER — METOPROLOL TARTRATE 25 MG/10 ML ORAL SUSPENSION: 12.5000 mg | Freq: Two times a day (BID) | ORAL | Status: DC

## 2018-05-11 MED ORDER — ACETAMINOPHEN 160 MG/5ML PO SOLN: 1000.0000 mg | Freq: Four times a day (QID) | ORAL | Status: AC

## 2018-05-11 MED ORDER — SODIUM CHLORIDE 0.9 % IV SOLN
0.0000 ug/min | INTRAVENOUS | Status: DC
  Filled 2018-05-11: qty 2

## 2018-05-11 MED ORDER — ACETAMINOPHEN 650 MG RE SUPP
650.0000 mg | Freq: Once | RECTAL | Status: AC
  Administered 2018-05-11: 650 mg via RECTAL

## 2018-05-11 MED ORDER — HEMOSTATIC AGENTS (NO CHARGE) OPTIME
TOPICAL | Status: DC | PRN
  Administered 2018-05-11: 1 via TOPICAL

## 2018-05-11 MED ORDER — PHENYLEPHRINE HCL 10 MG/ML IJ SOLN
INTRAVENOUS | Status: DC | PRN
  Administered 2018-05-11: 25 ug/min via INTRAVENOUS

## 2018-05-11 MED ORDER — ROCURONIUM BROMIDE 10 MG/ML (PF) SYRINGE
PREFILLED_SYRINGE | INTRAVENOUS | Status: AC
  Filled 2018-05-11: qty 10

## 2018-05-11 MED ORDER — MIDAZOLAM HCL 2 MG/2ML IJ SOLN: 2.0000 mg | INTRAMUSCULAR | Status: DC | PRN

## 2018-05-11 MED ORDER — FENTANYL CITRATE (PF) 100 MCG/2ML IJ SOLN: 50.0000 ug | INTRAMUSCULAR | Status: DC | PRN

## 2018-05-11 MED ORDER — ROCURONIUM BROMIDE 10 MG/ML (PF) SYRINGE
PREFILLED_SYRINGE | INTRAVENOUS | Status: AC
  Filled 2018-05-11: qty 5

## 2018-05-11 MED ORDER — LIDOCAINE 2% (20 MG/ML) 5 ML SYRINGE
INTRAMUSCULAR | Status: AC
  Filled 2018-05-11: qty 5

## 2018-05-11 MED ORDER — DEXMEDETOMIDINE HCL IN NACL 200 MCG/50ML IV SOLN
INTRAVENOUS | Status: AC
  Filled 2018-05-11: qty 50

## 2018-05-11 MED ORDER — MAGNESIUM SULFATE 4 GM/100ML IV SOLN
4.0000 g | Freq: Once | INTRAVENOUS | Status: AC
  Administered 2018-05-11: 4 g via INTRAVENOUS
  Filled 2018-05-11: qty 100

## 2018-05-11 MED ORDER — PHENYLEPHRINE 40 MCG/ML (10ML) SYRINGE FOR IV PUSH (FOR BLOOD PRESSURE SUPPORT)
PREFILLED_SYRINGE | INTRAVENOUS | Status: AC
  Filled 2018-05-11: qty 10

## 2018-05-11 MED ORDER — LACTATED RINGERS IV SOLN
INTRAVENOUS | Status: DC | PRN
  Administered 2018-05-11 (×3): via INTRAVENOUS

## 2018-05-11 MED ORDER — ONDANSETRON HCL 4 MG/2ML IJ SOLN: 4.0000 mg | Freq: Four times a day (QID) | INTRAMUSCULAR | Status: DC | PRN

## 2018-05-11 MED ORDER — HEMOSTATIC AGENTS (NO CHARGE) OPTIME
TOPICAL | Status: DC | PRN
Start: 2018-05-11 — End: 2018-05-11
  Administered 2018-05-11: 1 via TOPICAL

## 2018-05-11 MED ORDER — SODIUM CHLORIDE 0.45 % IV SOLN: INTRAVENOUS | Status: DC | PRN

## 2018-05-11 MED ORDER — FENTANYL CITRATE (PF) 250 MCG/5ML IJ SOLN
INTRAMUSCULAR | Status: AC
  Filled 2018-05-11: qty 20

## 2018-05-11 MED ORDER — ROCURONIUM BROMIDE 100 MG/10ML IV SOLN
INTRAVENOUS | Status: DC | PRN
  Administered 2018-05-11 (×2): 30 mg via INTRAVENOUS
  Administered 2018-05-11 (×3): 50 mg via INTRAVENOUS
  Administered 2018-05-11: 70 mg via INTRAVENOUS

## 2018-05-11 MED ORDER — METOCLOPRAMIDE HCL 5 MG/ML IJ SOLN
10.0000 mg | Freq: Four times a day (QID) | INTRAMUSCULAR | Status: AC
  Administered 2018-05-11 – 2018-05-16 (×17): 10 mg via INTRAVENOUS
  Filled 2018-05-11 (×15): qty 2

## 2018-05-11 MED ORDER — NITROGLYCERIN IN D5W 200-5 MCG/ML-% IV SOLN: 0.0000 ug/min | INTRAVENOUS | Status: DC

## 2018-05-11 MED ORDER — DEXMEDETOMIDINE HCL IN NACL 400 MCG/100ML IV SOLN
0.0000 ug/kg/h | INTRAVENOUS | Status: DC
  Administered 2018-05-12: 0.2 ug/kg/h via INTRAVENOUS
  Filled 2018-05-11: qty 100

## 2018-05-11 MED ORDER — ALBUMIN HUMAN 5 % IV SOLN
INTRAVENOUS | Status: DC | PRN
Start: 2018-05-11 — End: 2018-05-11
  Administered 2018-05-11 (×2): via INTRAVENOUS

## 2018-05-11 MED ORDER — ORAL CARE MOUTH RINSE
15.0000 mL | OROMUCOSAL | Status: DC
  Administered 2018-05-11 – 2018-05-12 (×2): 15 mL via OROMUCOSAL

## 2018-05-11 MED ORDER — MIDAZOLAM HCL 5 MG/5ML IJ SOLN
INTRAMUSCULAR | Status: DC | PRN
  Administered 2018-05-11: 3 mg via INTRAVENOUS
  Administered 2018-05-11: 2 mg via INTRAVENOUS
  Administered 2018-05-11 (×2): 1 mg via INTRAVENOUS
  Administered 2018-05-11: 5 mg via INTRAVENOUS

## 2018-05-11 MED ORDER — PROTAMINE SULFATE 10 MG/ML IV SOLN
INTRAVENOUS | Status: AC
  Filled 2018-05-11: qty 5

## 2018-05-11 MED ORDER — METOPROLOL TARTRATE 12.5 MG HALF TABLET
12.5000 mg | ORAL_TABLET | Freq: Two times a day (BID) | ORAL | Status: DC
  Administered 2018-05-12: 12.5 mg via ORAL
  Filled 2018-05-11: qty 1

## 2018-05-11 MED ORDER — MILRINONE LACTATE IN DEXTROSE 20-5 MG/100ML-% IV SOLN
0.2500 ug/kg/min | INTRAVENOUS | Status: DC
  Administered 2018-05-12: 0.25 ug/kg/min via INTRAVENOUS
  Administered 2018-05-12: 0.3 ug/kg/min via INTRAVENOUS
  Administered 2018-05-13: 0.25 ug/kg/min via INTRAVENOUS
  Filled 2018-05-11 (×3): qty 100

## 2018-05-11 MED ORDER — BISACODYL 10 MG RE SUPP: 10.0000 mg | Freq: Every day | RECTAL | Status: DC

## 2018-05-11 MED ORDER — SODIUM CHLORIDE 0.9 % IV SOLN
INTRAVENOUS | Status: DC
  Filled 2018-05-11: qty 1

## 2018-05-11 MED ORDER — TRANEXAMIC ACID 1000 MG/10ML IV SOLN
INTRAVENOUS | Status: DC | PRN
  Administered 2018-05-11: 1605 mg via INTRAVENOUS

## 2018-05-11 MED ORDER — LACTATED RINGERS IV SOLN: 500.0000 mL | Freq: Once | INTRAVENOUS | Status: DC | PRN

## 2018-05-11 MED ORDER — FAMOTIDINE IN NACL 20-0.9 MG/50ML-% IV SOLN
20.0000 mg | Freq: Two times a day (BID) | INTRAVENOUS | Status: AC
  Administered 2018-05-11 – 2018-05-12 (×2): 20 mg via INTRAVENOUS
  Filled 2018-05-11: qty 50

## 2018-05-11 MED ORDER — MIDAZOLAM HCL 2 MG/2ML IJ SOLN
INTRAMUSCULAR | Status: AC
  Filled 2018-05-11: qty 2

## 2018-05-11 MED ORDER — VANCOMYCIN HCL IN DEXTROSE 1-5 GM/200ML-% IV SOLN
1000.0000 mg | Freq: Two times a day (BID) | INTRAVENOUS | Status: AC
  Administered 2018-05-12 (×2): 1000 mg via INTRAVENOUS
  Filled 2018-05-11 (×2): qty 200

## 2018-05-11 MED ORDER — SODIUM CHLORIDE 0.9 % IV SOLN
INTRAVENOUS | Status: DC | PRN
  Administered 2018-05-11: 1 [IU]/h via INTRAVENOUS

## 2018-05-11 MED ORDER — TRAMADOL HCL 50 MG PO TABS: 50.0000 mg | ORAL_TABLET | ORAL | Status: DC | PRN

## 2018-05-11 MED ORDER — ARTIFICIAL TEARS OPHTHALMIC OINT
TOPICAL_OINTMENT | OPHTHALMIC | Status: DC | PRN
  Administered 2018-05-11: 1 via OPHTHALMIC

## 2018-05-11 MED ORDER — ASPIRIN EC 325 MG PO TBEC
325.0000 mg | DELAYED_RELEASE_TABLET | Freq: Every day | ORAL | Status: DC
  Administered 2018-05-12 – 2018-05-17 (×5): 325 mg via ORAL
  Filled 2018-05-11 (×6): qty 1

## 2018-05-11 MED ORDER — HEPARIN SODIUM (PORCINE) 1000 UNIT/ML IJ SOLN
INTRAMUSCULAR | Status: DC | PRN
  Administered 2018-05-11: 36000 [IU] via INTRAVENOUS
  Administered 2018-05-11: 2000 [IU] via INTRAVENOUS

## 2018-05-11 MED ORDER — PROPOFOL 10 MG/ML IV BOLUS
INTRAVENOUS | Status: AC
  Filled 2018-05-11: qty 20

## 2018-05-11 MED ORDER — POTASSIUM CHLORIDE 10 MEQ/50ML IV SOLN
10.0000 meq | INTRAVENOUS | Status: AC
  Administered 2018-05-11 (×3): 10 meq via INTRAVENOUS

## 2018-05-11 MED ORDER — HEMOSTATIC AGENTS (NO CHARGE) OPTIME
TOPICAL | Status: DC | PRN
  Administered 2018-05-11 (×2): 1 via TOPICAL

## 2018-05-11 MED ORDER — MORPHINE SULFATE (PF) 2 MG/ML IV SOLN: 1.0000 mg | INTRAVENOUS | Status: DC | PRN

## 2018-05-11 MED ORDER — SUCCINYLCHOLINE CHLORIDE 20 MG/ML IJ SOLN
INTRAMUSCULAR | Status: DC | PRN
  Administered 2018-05-11: 100 mg via INTRAVENOUS

## 2018-05-11 MED ORDER — 0.9 % SODIUM CHLORIDE (POUR BTL) OPTIME
TOPICAL | Status: DC | PRN
  Administered 2018-05-11: 1000 mL

## 2018-05-11 MED ORDER — SODIUM CHLORIDE 0.9 % IV SOLN: Freq: Once | INTRAVENOUS | Status: DC

## 2018-05-11 MED ORDER — EPHEDRINE SULFATE 50 MG/ML IJ SOLN
INTRAMUSCULAR | Status: AC
  Filled 2018-05-11: qty 1

## 2018-05-11 MED ORDER — BISACODYL 5 MG PO TBEC
10.0000 mg | DELAYED_RELEASE_TABLET | Freq: Every day | ORAL | Status: DC
  Administered 2018-05-12 – 2018-05-15 (×4): 10 mg via ORAL
  Filled 2018-05-11 (×5): qty 2

## 2018-05-11 MED ORDER — NITROGLYCERIN 0.2 MG/ML ON CALL CATH LAB
INTRAVENOUS | Status: DC | PRN
  Administered 2018-05-11 (×2): 40 ug via INTRAVENOUS

## 2018-05-11 MED ORDER — INSULIN REGULAR BOLUS VIA INFUSION
0.0000 [IU] | Freq: Three times a day (TID) | INTRAVENOUS | Status: DC
  Administered 2018-05-12: 2.9 [IU] via INTRAVENOUS
  Filled 2018-05-11: qty 10

## 2018-05-11 MED ORDER — PROPOFOL 10 MG/ML IV BOLUS
INTRAVENOUS | Status: DC | PRN
  Administered 2018-05-11: 50 mg via INTRAVENOUS

## 2018-05-11 MED ORDER — DOPAMINE-DEXTROSE 3.2-5 MG/ML-% IV SOLN: 3.0000 ug/kg/min | INTRAVENOUS | Status: DC

## 2018-05-11 MED ORDER — FENTANYL CITRATE (PF) 250 MCG/5ML IJ SOLN
INTRAMUSCULAR | Status: AC
  Filled 2018-05-11: qty 5

## 2018-05-11 MED ORDER — ACETAMINOPHEN 500 MG PO TABS
1000.0000 mg | ORAL_TABLET | Freq: Four times a day (QID) | ORAL | Status: AC
  Administered 2018-05-12 – 2018-05-16 (×19): 1000 mg via ORAL
  Filled 2018-05-11 (×19): qty 2

## 2018-05-11 MED ORDER — DOPAMINE-DEXTROSE 3.2-5 MG/ML-% IV SOLN
INTRAVENOUS | Status: DC | PRN
  Administered 2018-05-11: 2 ug/kg/min via INTRAVENOUS

## 2018-05-11 MED ORDER — ACETAMINOPHEN 160 MG/5ML PO SOLN: 650.0000 mg | Freq: Once | ORAL | Status: AC

## 2018-05-11 MED ORDER — DEXMEDETOMIDINE HCL IN NACL 200 MCG/50ML IV SOLN
0.0000 ug/kg/h | INTRAVENOUS | Status: DC
  Administered 2018-05-11: 0.6 ug/kg/h via INTRAVENOUS
  Filled 2018-05-11 (×2): qty 50

## 2018-05-11 MED ORDER — MILRINONE LACTATE IN DEXTROSE 20-5 MG/100ML-% IV SOLN
INTRAVENOUS | Status: DC | PRN
  Administered 2018-05-11: 0.25 ug/kg/min via INTRAVENOUS

## 2018-05-11 MED ORDER — SODIUM CHLORIDE 0.9% FLUSH: 3.0000 mL | INTRAVENOUS | Status: DC | PRN

## 2018-05-11 MED ORDER — DEXMEDETOMIDINE HCL IN NACL 400 MCG/100ML IV SOLN
INTRAVENOUS | Status: DC | PRN
  Administered 2018-05-11: .3 ug/kg/h via INTRAVENOUS

## 2018-05-11 MED ORDER — CEFUROXIME SODIUM 1.5 G IV SOLR
1.5000 g | Freq: Two times a day (BID) | INTRAVENOUS | Status: AC
  Administered 2018-05-12 – 2018-05-13 (×4): 1.5 g via INTRAVENOUS
  Filled 2018-05-11 (×4): qty 1.5

## 2018-05-11 MED ORDER — CHLORHEXIDINE GLUCONATE 0.12 % MT SOLN
15.0000 mL | OROMUCOSAL | Status: AC
  Administered 2018-05-11: 15 mL via OROMUCOSAL

## 2018-05-11 MED ORDER — MIDAZOLAM HCL 10 MG/2ML IJ SOLN
INTRAMUSCULAR | Status: AC
  Filled 2018-05-11: qty 2

## 2018-05-11 MED ORDER — SODIUM CHLORIDE 0.9 % IV SOLN: 250.0000 mL | INTRAVENOUS | Status: DC

## 2018-05-11 MED ORDER — ATORVASTATIN CALCIUM 80 MG PO TABS
80.0000 mg | ORAL_TABLET | Freq: Every day | ORAL | Status: DC
  Administered 2018-05-12 – 2018-05-16 (×5): 80 mg via ORAL
  Filled 2018-05-11 (×5): qty 1

## 2018-05-11 MED ORDER — METOPROLOL TARTRATE 5 MG/5ML IV SOLN
2.5000 mg | INTRAVENOUS | Status: DC | PRN
  Administered 2018-05-12: 2.5 mg via INTRAVENOUS
  Administered 2018-05-12: 5 mg via INTRAVENOUS
  Administered 2018-05-12: 2.5 mg via INTRAVENOUS
  Administered 2018-05-12: 5 mg via INTRAVENOUS
  Filled 2018-05-11 (×3): qty 5

## 2018-05-11 MED ORDER — FENTANYL CITRATE (PF) 100 MCG/2ML IJ SOLN
INTRAMUSCULAR | Status: DC | PRN
  Administered 2018-05-11: 50 ug via INTRAVENOUS
  Administered 2018-05-11: 100 ug via INTRAVENOUS
  Administered 2018-05-11 (×2): 150 ug via INTRAVENOUS
  Administered 2018-05-11 (×3): 100 ug via INTRAVENOUS
  Administered 2018-05-11: 450 ug via INTRAVENOUS

## 2018-05-11 MED ORDER — SODIUM CHLORIDE 0.9% FLUSH
3.0000 mL | Freq: Two times a day (BID) | INTRAVENOUS | Status: DC
  Administered 2018-05-12 – 2018-05-14 (×4): 3 mL via INTRAVENOUS
  Administered 2018-05-14: 09:00:00 via INTRAVENOUS
  Administered 2018-05-14 – 2018-05-16 (×3): 3 mL via INTRAVENOUS

## 2018-05-11 MED ORDER — PLASMA-LYTE 148 IV SOLN
INTRAVENOUS | Status: DC | PRN
  Administered 2018-05-11: 15:00:00 via INTRAVASCULAR

## 2018-05-11 MED ORDER — PROTAMINE SULFATE 10 MG/ML IV SOLN
INTRAVENOUS | Status: AC
  Filled 2018-05-11: qty 25

## 2018-05-11 SURGICAL SUPPLY — 117 items
ADAPTER CARDIO PERF ANTE/RETRO (ADAPTER) ×5 IMPLANT
ADPR PRFSN 84XANTGRD RTRGD (ADAPTER) ×3
APPLIER CLIP 9.375 SM OPEN (CLIP) ×5
APR CLP SM 9.3 20 MLT OPN (CLIP) ×3
BAG DECANTER FOR FLEXI CONT (MISCELLANEOUS) ×8 IMPLANT
BANDAGE ACE 4X5 VEL STRL LF (GAUZE/BANDAGES/DRESSINGS) ×5 IMPLANT
BANDAGE ACE 6X5 VEL STRL LF (GAUZE/BANDAGES/DRESSINGS) ×5 IMPLANT
BASKET HEART  (ORDER IN 25'S) (MISCELLANEOUS) ×1
BASKET HEART (ORDER IN 25'S) (MISCELLANEOUS) ×1
BASKET HEART (ORDER IN 25S) (MISCELLANEOUS) ×3 IMPLANT
BLADE CLIPPER SURG (BLADE) IMPLANT
BLADE STERNUM SYSTEM 6 (BLADE) ×5 IMPLANT
BLADE SURG 11 STRL SS (BLADE) ×3 IMPLANT
BLADE SURG 12 STRL SS (BLADE) ×5 IMPLANT
BLADE SURG 15 STRL LF DISP TIS (BLADE) ×3 IMPLANT
BLADE SURG 15 STRL SS (BLADE) ×5
BNDG GAUZE ELAST 4 BULKY (GAUZE/BANDAGES/DRESSINGS) ×5 IMPLANT
CANISTER SUCT 3000ML PPV (MISCELLANEOUS) ×5 IMPLANT
CANNULA ARTERIAL NVNT 3/8 22FR (MISCELLANEOUS) ×3 IMPLANT
CANNULA GUNDRY RCSP 15FR (MISCELLANEOUS) ×8 IMPLANT
CATH CPB KIT VANTRIGT (MISCELLANEOUS) ×5 IMPLANT
CATH ROBINSON RED A/P 18FR (CATHETERS) ×15 IMPLANT
CATH THORACIC 36FR RT ANG (CATHETERS) ×5 IMPLANT
CLIP APPLIE 9.375 SM OPEN (CLIP) ×3 IMPLANT
CLIP FOGARTY SPRING 6M (CLIP) ×3 IMPLANT
CLIP RETRACTION 3.0MM CORONARY (MISCELLANEOUS) ×3 IMPLANT
CLIP VESOCCLUDE MED 24/CT (CLIP) ×3 IMPLANT
CLIP VESOCCLUDE SM WIDE 24/CT (CLIP) ×3 IMPLANT
COVER MAYO STAND STRL (DRAPES) ×5 IMPLANT
CRADLE DONUT ADULT HEAD (MISCELLANEOUS) ×5 IMPLANT
DRAIN CHANNEL 32F RND 10.7 FF (WOUND CARE) ×5 IMPLANT
DRAPE CARDIOVASCULAR INCISE (DRAPES) ×5
DRAPE HALF SHEET 40X57 (DRAPES) ×5 IMPLANT
DRAPE SLUSH/WARMER DISC (DRAPES) ×5 IMPLANT
DRAPE SRG 135X102X78XABS (DRAPES) ×3 IMPLANT
DRSG AQUACEL AG ADV 3.5X14 (GAUZE/BANDAGES/DRESSINGS) ×5 IMPLANT
ELECT BLADE 4.0 EZ CLEAN MEGAD (MISCELLANEOUS) ×5
ELECT BLADE 6.5 EXT (BLADE) ×5 IMPLANT
ELECT CAUTERY BLADE 6.4 (BLADE) ×5 IMPLANT
ELECT REM PT RETURN 9FT ADLT (ELECTROSURGICAL) ×10
ELECTRODE BLDE 4.0 EZ CLN MEGD (MISCELLANEOUS) ×3 IMPLANT
ELECTRODE REM PT RTRN 9FT ADLT (ELECTROSURGICAL) ×6 IMPLANT
FELT TEFLON 1X6 (MISCELLANEOUS) ×10 IMPLANT
GAUZE SPONGE 4X4 12PLY STRL (GAUZE/BANDAGES/DRESSINGS) ×7 IMPLANT
GAUZE SPONGE 4X4 12PLY STRL LF (GAUZE/BANDAGES/DRESSINGS) ×3 IMPLANT
GEL ULTRASOUND 20GR AQUASONIC (MISCELLANEOUS) ×5 IMPLANT
GLOVE BIO SURGEON STRL SZ 6.5 (GLOVE) ×10 IMPLANT
GLOVE BIO SURGEON STRL SZ7.5 (GLOVE) ×21 IMPLANT
GLOVE BIO SURGEONS STRL SZ 6.5 (GLOVE) ×5
GLOVE BIOGEL PI IND STRL 6.5 (GLOVE) ×3 IMPLANT
GLOVE BIOGEL PI INDICATOR 6.5 (GLOVE) ×6
GLOVE SURG SS PI 6.0 STRL IVOR (GLOVE) ×6 IMPLANT
GOWN STRL REUS W/ TWL LRG LVL3 (GOWN DISPOSABLE) ×13 IMPLANT
GOWN STRL REUS W/TWL LRG LVL3 (GOWN DISPOSABLE) ×25
HARMONIC SHEARS 14CM COAG (MISCELLANEOUS) ×5 IMPLANT
HEMOSTAT POWDER SURGIFOAM 1G (HEMOSTASIS) ×15 IMPLANT
HEMOSTAT SURGICEL 2X14 (HEMOSTASIS) ×5 IMPLANT
INSERT FOGARTY XLG (MISCELLANEOUS) IMPLANT
KIT BASIN OR (CUSTOM PROCEDURE TRAY) ×5 IMPLANT
KIT SUCTION CATH 14FR (SUCTIONS) ×5 IMPLANT
KIT TURNOVER KIT B (KITS) ×5 IMPLANT
KIT VASOVIEW HEMOPRO VH 3000 (KITS) ×5 IMPLANT
LEAD PACING MYOCARDI (MISCELLANEOUS) ×5 IMPLANT
LOOP VESSEL MAXI BLUE (MISCELLANEOUS) ×3 IMPLANT
MARKER GRAFT CORONARY BYPASS (MISCELLANEOUS) ×15 IMPLANT
NS IRRIG 1000ML POUR BTL (IV SOLUTION) ×25 IMPLANT
PACK E OPEN HEART (SUTURE) ×5 IMPLANT
PACK OPEN HEART (CUSTOM PROCEDURE TRAY) ×5 IMPLANT
PAD ARMBOARD 7.5X6 YLW CONV (MISCELLANEOUS) ×10 IMPLANT
PAD ELECT DEFIB RADIOL ZOLL (MISCELLANEOUS) ×5 IMPLANT
PENCIL BUTTON HOLSTER BLD 10FT (ELECTRODE) ×5 IMPLANT
POWDER SURGICEL 3.0 GRAM (HEMOSTASIS) ×3 IMPLANT
PUNCH AORTIC ROTATE  4.5MM 8IN (MISCELLANEOUS) ×3 IMPLANT
PUNCH AORTIC ROTATE 4.0MM (MISCELLANEOUS) IMPLANT
PUNCH AORTIC ROTATE 4.5MM 8IN (MISCELLANEOUS) IMPLANT
PUNCH AORTIC ROTATE 5MM 8IN (MISCELLANEOUS) IMPLANT
SET CARDIOPLEGIA MPS 5001102 (MISCELLANEOUS) ×3 IMPLANT
SHEARS HARMONIC 9CM CVD (BLADE) ×5 IMPLANT
SOLUTION ANTI FOG 6CC (MISCELLANEOUS) ×3 IMPLANT
SPONGE LAP 18X18 X RAY DECT (DISPOSABLE) ×6 IMPLANT
SPONGE LAP 4X18 RFD (DISPOSABLE) ×3 IMPLANT
SURGIFLO W/THROMBIN 8M KIT (HEMOSTASIS) ×5 IMPLANT
SUT BONE WAX W31G (SUTURE) ×5 IMPLANT
SUT MNCRL AB 4-0 PS2 18 (SUTURE) ×3 IMPLANT
SUT PROLENE 3 0 SH DA (SUTURE) IMPLANT
SUT PROLENE 3 0 SH1 36 (SUTURE) IMPLANT
SUT PROLENE 4 0 RB 1 (SUTURE) ×15
SUT PROLENE 4 0 SH DA (SUTURE) ×17 IMPLANT
SUT PROLENE 4-0 RB1 .5 CRCL 36 (SUTURE) ×5 IMPLANT
SUT PROLENE 5 0 C 1 36 (SUTURE) IMPLANT
SUT PROLENE 6 0 C 1 30 (SUTURE) ×9 IMPLANT
SUT PROLENE 6 0 CC (SUTURE) ×15 IMPLANT
SUT PROLENE 8 0 BV175 6 (SUTURE) ×3 IMPLANT
SUT PROLENE BLUE 7 0 (SUTURE) ×8 IMPLANT
SUT SILK  1 MH (SUTURE)
SUT SILK 1 MH (SUTURE) IMPLANT
SUT SILK 2 0 SH CR/8 (SUTURE) IMPLANT
SUT SILK 3 0 SH CR/8 (SUTURE) ×3 IMPLANT
SUT SILK 4 0 TIE 10X30 (SUTURE) ×3 IMPLANT
SUT STEEL 6MS V (SUTURE) ×7 IMPLANT
SUT STEEL SZ 6 DBL 3X14 BALL (SUTURE) ×8 IMPLANT
SUT VIC AB 1 CTX 36 (SUTURE) ×20
SUT VIC AB 1 CTX36XBRD ANBCTR (SUTURE) ×8 IMPLANT
SUT VIC AB 2-0 CT1 27 (SUTURE) ×5
SUT VIC AB 2-0 CT1 TAPERPNT 27 (SUTURE) ×1 IMPLANT
SUT VIC AB 2-0 CTX 27 (SUTURE) IMPLANT
SUT VIC AB 3-0 SH 27 (SUTURE)
SUT VIC AB 3-0 SH 27X BRD (SUTURE) IMPLANT
SUT VIC AB 3-0 X1 27 (SUTURE) IMPLANT
SYR 50ML SLIP (SYRINGE) IMPLANT
SYSTEM SAHARA CHEST DRAIN ATS (WOUND CARE) ×5 IMPLANT
TOWEL GREEN STERILE (TOWEL DISPOSABLE) ×5 IMPLANT
TOWEL GREEN STERILE FF (TOWEL DISPOSABLE) ×5 IMPLANT
TRAY FOLEY SLVR 16FR TEMP STAT (SET/KITS/TRAYS/PACK) ×5 IMPLANT
TUBING INSUFFLATION (TUBING) ×5 IMPLANT
UNDERPAD 30X30 (UNDERPADS AND DIAPERS) ×5 IMPLANT
WATER STERILE IRR 1000ML POUR (IV SOLUTION) ×10 IMPLANT

## 2018-05-11 NOTE — Progress Notes (Signed)
Pre Procedure note for inpatients:   BAYLIN GAMBLIN has been scheduled for Procedure(s): CORONARY ARTERY BYPASS GRAFTING (CABG) (N/A) TRANSESOPHAGEAL ECHOCARDIOGRAM (TEE) (N/A) possible RADIAL ARTERY HARVEST (Left) today. The various methods of treatment have been discussed with the patient. After consideration of the risks, benefits and treatment options the patient has consented to the planned procedure.   The patient has been seen and labs reviewed. There are no changes in the patient's condition to prevent proceeding with the planned procedure today.  Recent labs:  Lab Results  Component Value Date   WBC 11.4 (H) 05/10/2018   HGB 14.2 05/10/2018   HCT 43.4 05/10/2018   PLT 272 05/10/2018   GLUCOSE 130 (H) 05/10/2018   ALT 22 05/10/2018   AST 19 05/10/2018   NA 136 05/10/2018   K 4.0 05/10/2018   CL 104 05/10/2018   CREATININE 1.22 05/10/2018   BUN 19 05/10/2018   CO2 23 05/10/2018   INR 1.06 05/04/2018   HGBA1C 6.0 (H) 05/03/2018    Len Childs, MD 05/11/2018 8:03 AM

## 2018-05-11 NOTE — Transfer of Care (Signed)
Immediate Anesthesia Transfer of Care Note  Patient: Alejandro [name]  Procedure(s) Performed: CORONARY ARTERY BYPASS GRAFTING (CABG), on pump, times four, using left internal mammary artery and endoscopically harvested right greater saphenous leg vein. (N/A Chest) TRANSESOPHAGEAL ECHOCARDIOGRAM (TEE) (N/A )  Patient Location: SICU  Anesthesia Type:General  Level of Consciousness: Patient remains intubated per anesthesia plan  Airway & Oxygen Therapy: Patient remains intubated per anesthesia plan and Patient placed on Ventilator (see vital sign flow sheet for setting)  Post-op Assessment: Post -op Vital signs reviewed and stable  Post vital signs: stable  Last Vitals:  Vitals Value Taken Time  BP    Temp    Pulse    Resp 12 05/11/2018  6:53 PM  SpO2 97 % 05/11/2018  6:53 PM  Vitals shown include unvalidated device data.  Last Pain:  Vitals:   05/11/18 0603  TempSrc: Oral  PainSc:       Patients Stated Pain Goal: 0 (91/91/66 0600)  Complications: No apparent anesthesia complications

## 2018-05-11 NOTE — Progress Notes (Signed)
  Echocardiogram Echocardiogram Transesophageal has been performed.  Allyce Bochicchio L Androw 05/11/2018, 3:20 PM

## 2018-05-11 NOTE — Brief Op Note (Signed)
05/02/2018 - 05/11/2018  8:04 AM  PATIENT:  Alejandro [name]  77 y.o. male  PRE-OPERATIVE DIAGNOSIS:  CAD  POST-OPERATIVE DIAGNOSIS:  CAD  PROCEDURE:  Procedure(s): CORONARY ARTERY BYPASS GRAFTING (CABG), on pump, times four, using left internal mammary artery and endoscopically harvested right greater saphenous leg vein. (N/A) TRANSESOPHAGEAL ECHOCARDIOGRAM (TEE) (N/A)  LIMA to LAD SVG to OM1 SVG to Diag1 SVG to PDA  SURGEON:  Surgeon(s) and Role:    Ivin Poot, MD - Primary  PHYSICIAN ASSISTANT:  Nicholes Rough, PA-C Jadene Pierini, PA-C   ANESTHESIA:   general  EBL:  900 mL   BLOOD ADMINISTERED:none  DRAINS: routine  LOCAL MEDICATIONS USED:  NONE  SPECIMEN:  No Specimen  DISPOSITION OF SPECIMEN:  N/A  COUNTS:  YES  DICTATION: .Dragon Dictation  PLAN OF CARE: Admit to inpatient   PATIENT DISPOSITION:  ICU - intubated and hemodynamically stable.   Delay start of Pharmacological VTE agent (>24hrs) due to surgical blood loss or risk of bleeding: yes

## 2018-05-11 NOTE — Anesthesia Procedure Notes (Signed)
Central Venous Catheter Insertion Performed by: Roderic Palau, MD, anesthesiologist Start/End6/10/2018 7:00 AM, 05/11/2018 7:15 AM Patient location: Pre-op. Preanesthetic checklist: patient identified, IV checked, site marked, risks and benefits discussed, surgical consent, monitors and equipment checked, pre-op evaluation, timeout performed and anesthesia consent Position: Trendelenburg Lidocaine 1% used for infiltration and patient sedated Hand hygiene performed , maximum sterile barriers used  and Seldinger technique used Catheter size: 9 Fr Total catheter length 10. Central line was placed.MAC introducer Procedure performed using ultrasound guided technique. Ultrasound Notes:anatomy identified, needle tip was noted to be adjacent to the nerve/plexus identified, no ultrasound evidence of intravascular and/or intraneural injection and image(s) printed for medical record Attempts: 1 Following insertion, line sutured, dressing applied and Biopatch. Post procedure assessment: blood return through all ports, free fluid flow and no air  Patient tolerated the procedure well with no immediate complications.

## 2018-05-11 NOTE — OR Nursing (Signed)
Forty-five minute call to CVICU at 1735. Spoke to [employer].

## 2018-05-11 NOTE — Anesthesia Procedure Notes (Signed)
Central Venous Catheter Insertion Performed by: Roderic Palau, MD, anesthesiologist Start/End6/10/2018 7:00 AM, 05/11/2018 7:15 AM Patient location: Pre-op. Preanesthetic checklist: patient identified, IV checked, site marked, risks and benefits discussed, surgical consent, monitors and equipment checked, pre-op evaluation, timeout performed and anesthesia consent Hand hygiene performed  and maximum sterile barriers used  PA cath was placed.Swan type:thermodilution PA Cath depth:50 Procedure performed without using ultrasound guided technique. Attempts: 1 Patient tolerated the procedure well with no immediate complications.

## 2018-05-11 NOTE — Anesthesia Procedure Notes (Signed)
Arterial Line Insertion Start/End6/10/2018 7:52 AM, 05/11/2018 7:52 AM Performed by: Lavell Luster, CRNA, CRNA  Patient location: Pre-op. Preanesthetic checklist: patient identified, IV checked, site marked, risks and benefits discussed, surgical consent, monitors and equipment checked, pre-op evaluation and timeout performed Lidocaine 1% used for infiltration and patient sedated Right, radial was placed Catheter size: 20 G Hand hygiene performed , maximum sterile barriers used  and Seldinger technique used Allen's test indicative of satisfactory collateral circulation Attempts: 1 Procedure performed without using ultrasound guided technique. Following insertion, dressing applied and Biopatch. Post procedure assessment: normal  Patient tolerated the procedure well with no immediate complications.

## 2018-05-11 NOTE — OR Nursing (Signed)
Twenty minute call to CVICU charge nurse at 1811. Spoke to [place].

## 2018-05-11 NOTE — Consult Note (Signed)
            [facility] CM Primary Care Navigator  05/11/2018  Alejandro [name] [birth year] [id number]   Attemptto see patient at the bedside to identify possible discharge needs but he is in surgery at the moment. (CORONARY ARTERY BYPASS GRAFTING- CABG).   Will attempt to see patient at another time when available in the room.    Addendum:  Patient was transferred from [facility] to [place] (cardiac ICU) after surgery.  Will attempt to see patient at another time when out of ICU.    For additional questions please contact:  Edwena Felty A. Lulamae Skorupski, BSN, RN-BC [facility] PRIMARY CARE Navigator Cell: [phone]

## 2018-05-11 NOTE — Anesthesia Postprocedure Evaluation (Signed)
Anesthesia Post Note  Patient: MELL GUIA  Procedure(s) Performed: CORONARY ARTERY BYPASS GRAFTING (CABG), on pump, times four, using left internal mammary artery and endoscopically harvested right greater saphenous leg vein. (N/A Chest) TRANSESOPHAGEAL ECHOCARDIOGRAM (TEE) (N/A )     Patient location during evaluation: SICU Anesthesia Type: General Level of consciousness: sedated Pain management: pain level controlled Vital Signs Assessment: post-procedure vital signs reviewed and stable Respiratory status: patient remains intubated per anesthesia plan Cardiovascular status: stable Postop Assessment: no apparent nausea or vomiting Anesthetic complications: no    Last Vitals:  Vitals:   05/11/18 1244 05/11/18 1854  BP:    Pulse: 73   Resp:    Temp:    SpO2: 99% 97%    Last Pain:  Vitals:   05/11/18 0603  TempSrc: Oral  PainSc:                  Tiajuana Amass

## 2018-05-11 NOTE — Anesthesia Procedure Notes (Signed)
Procedure Name: Intubation Date/Time: 05/11/2018 12:58 PM Performed by: Lavell Luster, CRNA Pre-anesthesia Checklist: Patient identified, Emergency Drugs available, Suction available, Patient being monitored and Timeout performed Patient Re-evaluated:Patient Re-evaluated prior to induction Oxygen Delivery Method: Circle system utilized Preoxygenation: Pre-oxygenation with 100% oxygen Induction Type: IV induction Ventilation: Mask ventilation without difficulty Laryngoscope Size: Mac, 4 and Glidescope Grade View: Grade I Tube type: Oral Tube size: 7.5 mm Number of attempts: 1 Airway Equipment and Method: Stylet and Video-laryngoscopy Placement Confirmation: ETT inserted through vocal cords under direct vision,  positive ETCO2 and breath sounds checked- equal and bilateral Secured at: 22 cm Tube secured with: Tape Dental Injury: Teeth and Oropharynx as per pre-operative assessment  Difficulty Due To: Difficulty was anticipated, Difficult Airway- due to limited oral opening, Difficult Airway- due to dentition and Difficult Airway- due to anterior larynx

## 2018-05-12 ENCOUNTER — Encounter (HOSPITAL_COMMUNITY): Payer: Self-pay | Admitting: Cardiothoracic Surgery

## 2018-05-12 ENCOUNTER — Inpatient Hospital Stay (HOSPITAL_COMMUNITY): Payer: Medicare Other

## 2018-05-12 ENCOUNTER — Ambulatory Visit: Payer: Medicare Other | Admitting: Cardiothoracic Surgery

## 2018-05-12 LAB — BASIC METABOLIC PANEL
Anion gap: 5 (ref 5–15)
BUN: 13 mg/dL (ref 6–20)
CO2: 24 mmol/L (ref 22–32)
Calcium: 7.8 mg/dL — ABNORMAL LOW (ref 8.9–10.3)
Chloride: 107 mmol/L (ref 101–111)
Creatinine, Ser: 1.07 mg/dL (ref 0.61–1.24)
GFR calc Af Amer: >60 mL/min (ref >60)
GFR calc non Af Amer: >60 mL/min (ref >60)
Glucose, Bld: 149 mg/dL — ABNORMAL HIGH (ref 65–99)
Potassium: 4 mmol/L (ref 3.5–5.1)
Sodium: 136 mmol/L (ref 135–145)

## 2018-05-12 LAB — CREATININE, SERUM
CREATININE: 1.11 mg/dL (ref 0.61–1.24)
GFR calc Af Amer: >60 mL/min (ref >60)

## 2018-05-12 LAB — POCT I-STAT 3, ART BLOOD GAS (G3+)
Acid-base deficit: 2 mmol/L (ref 0.0–2.0)
Acid-base deficit: 3 mmol/L — ABNORMAL HIGH (ref 0.0–2.0)
Acid-base deficit: 5 mmol/L — ABNORMAL HIGH (ref 0.0–2.0)
Bicarbonate: 21.6 mmol/L (ref 20.0–28.0)
Bicarbonate: 21.8 mmol/L (ref 20.0–28.0)
Bicarbonate: 23.1 mmol/L (ref 20.0–28.0)
O2 Saturation: 97 %
O2 Saturation: 98 %
O2 Saturation: 98 %
Patient temperature: 35.9
Patient temperature: 37.3
Patient temperature: 37.3
TCO2: 23 mmol/L (ref 22–32)
TCO2: 23 mmol/L (ref 22–32)
TCO2: 24 mmol/L (ref 22–32)
pCO2 arterial: 38.1 mmHg (ref 32.0–48.0)
pCO2 arterial: 39.5 mmHg (ref 32.0–48.0)
pCO2 arterial: 40.8 mmHg (ref 32.0–48.0)
pH, Arterial: 7.326 — ABNORMAL LOW (ref 7.350–7.450)
pH, Arterial: 7.368 (ref 7.350–7.450)
pH, Arterial: 7.376 (ref 7.350–7.450)
pO2, Arterial: 109 mmHg — ABNORMAL HIGH (ref 83.0–108.0)
pO2, Arterial: 116 mmHg — ABNORMAL HIGH (ref 83.0–108.0)
pO2, Arterial: 96 mmHg (ref 83.0–108.0)

## 2018-05-12 LAB — POCT I-STAT, CHEM 8
BUN: 14 mg/dL (ref 6–20)
BUN: 13 mg/dL (ref 6–20)
Calcium, Ion: 1.18 mmol/L (ref 1.15–1.40)
Calcium, Ion: 1.15 mmol/L (ref 1.15–1.40)
Chloride: 100 mmol/L — ABNORMAL LOW (ref 101–111)
Chloride: 98 mmol/L — ABNORMAL LOW (ref 101–111)
Creatinine, Ser: 0.9 mg/dL (ref 0.61–1.24)
Creatinine, Ser: 0.9 mg/dL (ref 0.61–1.24)
Glucose, Bld: 154 mg/dL — ABNORMAL HIGH (ref 65–99)
Glucose, Bld: 206 mg/dL — ABNORMAL HIGH (ref 65–99)
HCT: 36 % — ABNORMAL LOW (ref 39.0–52.0)
HCT: 34 % — ABNORMAL LOW (ref 39.0–52.0)
Hemoglobin: 12.2 g/dL — ABNORMAL LOW (ref 13.0–17.0)
Hemoglobin: 11.6 g/dL — ABNORMAL LOW (ref 13.0–17.0)
Potassium: 4 mmol/L (ref 3.5–5.1)
Potassium: 3.6 mmol/L (ref 3.5–5.1)
Sodium: 138 mmol/L (ref 135–145)
Sodium: 134 mmol/L — ABNORMAL LOW (ref 135–145)
TCO2: 22 mmol/L (ref 22–32)
TCO2: 21 mmol/L — ABNORMAL LOW (ref 22–32)

## 2018-05-12 LAB — GLUCOSE, CAPILLARY
Glucose-Capillary: 102 mg/dL — ABNORMAL HIGH (ref 65–99)
Glucose-Capillary: 119 mg/dL — ABNORMAL HIGH (ref 65–99)
Glucose-Capillary: 125 mg/dL — ABNORMAL HIGH (ref 65–99)
Glucose-Capillary: 127 mg/dL — ABNORMAL HIGH (ref 65–99)
Glucose-Capillary: 128 mg/dL — ABNORMAL HIGH (ref 65–99)
Glucose-Capillary: 129 mg/dL — ABNORMAL HIGH (ref 65–99)
Glucose-Capillary: 129 mg/dL — ABNORMAL HIGH (ref 65–99)
Glucose-Capillary: 132 mg/dL — ABNORMAL HIGH (ref 65–99)
Glucose-Capillary: 138 mg/dL — ABNORMAL HIGH (ref 65–99)
Glucose-Capillary: 141 mg/dL — ABNORMAL HIGH (ref 65–99)
Glucose-Capillary: 144 mg/dL — ABNORMAL HIGH (ref 65–99)
Glucose-Capillary: 152 mg/dL — ABNORMAL HIGH (ref 65–99)
Glucose-Capillary: 154 mg/dL — ABNORMAL HIGH (ref 65–99)
Glucose-Capillary: 158 mg/dL — ABNORMAL HIGH (ref 65–99)
Glucose-Capillary: 189 mg/dL — ABNORMAL HIGH (ref 65–99)

## 2018-05-12 LAB — MAGNESIUM
MAGNESIUM: 2.4 mg/dL (ref 1.7–2.4)
Magnesium: 2.2 mg/dL (ref 1.7–2.4)

## 2018-05-12 LAB — CBC
HCT: 35.8 % — ABNORMAL LOW (ref 39.0–52.0)
HEMATOCRIT: 35.3 % — AB (ref 39.0–52.0)
HEMOGLOBIN: 12 g/dL — AB (ref 13.0–17.0)
HEMOGLOBIN: 11.9 g/dL — AB (ref 13.0–17.0)
MCH: 29.1 pg (ref 26.0–34.0)
MCH: 29.6 pg (ref 26.0–34.0)
MCHC: 33.5 g/dL (ref 30.0–36.0)
MCHC: 33.7 g/dL (ref 30.0–36.0)
MCV: 86.9 fL (ref 78.0–100.0)
MCV: 87.8 fL (ref 78.0–100.0)
PLATELETS: 192 10*3/uL (ref 150–400)
Platelets: 169 10*3/uL (ref 150–400)
RBC: 4.12 MIL/uL — ABNORMAL LOW (ref 4.22–5.81)
RBC: 4.02 MIL/uL — AB (ref 4.22–5.81)
RDW: 14.7 % (ref 11.5–15.5)
RDW: 15.1 % (ref 11.5–15.5)
WBC: 14.6 10*3/uL — ABNORMAL HIGH (ref 4.0–10.5)
WBC: 18 10*3/uL — ABNORMAL HIGH (ref 4.0–10.5)

## 2018-05-12 LAB — PREPARE FRESH FROZEN PLASMA: UNIT DIVISION: 0

## 2018-05-12 LAB — BPAM FFP
BLOOD PRODUCT EXPIRATION DATE: 201906162359
Blood Product Expiration Date: 201906162359
ISSUE DATE / TIME: 201906111649
ISSUE DATE / TIME: 201906111649
UNIT TYPE AND RH: 6200
Unit Type and Rh: 6200

## 2018-05-12 LAB — POCT I-STAT 4, (NA,K, GLUC, HGB,HCT)
Glucose, Bld: 186 mg/dL — ABNORMAL HIGH (ref 65–99)
HCT: 34 % — ABNORMAL LOW (ref 39.0–52.0)
Hemoglobin: 11.6 g/dL — ABNORMAL LOW (ref 13.0–17.0)
Potassium: 3.9 mmol/L (ref 3.5–5.1)
Sodium: 138 mmol/L (ref 135–145)

## 2018-05-12 MED ORDER — LABETALOL HCL 5 MG/ML IV SOLN
20.0000 mg | INTRAVENOUS | Status: DC | PRN
  Administered 2018-05-13: 20 mg via INTRAVENOUS
  Filled 2018-05-12: qty 4

## 2018-05-12 MED ORDER — CLONIDINE HCL 0.2 MG PO TABS
0.2000 mg | ORAL_TABLET | Freq: Two times a day (BID) | ORAL | Status: DC
  Administered 2018-05-12 – 2018-05-17 (×10): 0.2 mg via ORAL
  Filled 2018-05-12 (×10): qty 1

## 2018-05-12 MED ORDER — METOPROLOL TARTRATE 50 MG PO TABS
50.0000 mg | ORAL_TABLET | Freq: Two times a day (BID) | ORAL | Status: DC
  Administered 2018-05-12 – 2018-05-15 (×6): 50 mg via ORAL
  Filled 2018-05-12 (×6): qty 1

## 2018-05-12 MED ORDER — POTASSIUM CHLORIDE 10 MEQ/50ML IV SOLN
10.0000 meq | INTRAVENOUS | Status: AC
  Administered 2018-05-12 (×2): 10 meq via INTRAVENOUS
  Filled 2018-05-12 (×2): qty 50

## 2018-05-12 MED ORDER — POTASSIUM CHLORIDE 10 MEQ/50ML IV SOLN
10.0000 meq | INTRAVENOUS | Status: AC | PRN
  Administered 2018-05-12 (×3): 10 meq via INTRAVENOUS
  Filled 2018-05-12 (×2): qty 50

## 2018-05-12 MED ORDER — POTASSIUM CHLORIDE 10 MEQ/50ML IV SOLN
INTRAVENOUS | Status: AC
  Administered 2018-05-12: 10 meq via INTRAVENOUS
  Filled 2018-05-12: qty 50

## 2018-05-12 MED ORDER — HYDRALAZINE HCL 25 MG PO TABS
25.0000 mg | ORAL_TABLET | Freq: Three times a day (TID) | ORAL | Status: DC
  Administered 2018-05-12 – 2018-05-17 (×14): 25 mg via ORAL
  Filled 2018-05-12 (×14): qty 1

## 2018-05-12 MED ORDER — FUROSEMIDE 10 MG/ML IJ SOLN
40.0000 mg | Freq: Two times a day (BID) | INTRAMUSCULAR | Status: DC
  Administered 2018-05-12 – 2018-05-13 (×3): 40 mg via INTRAVENOUS
  Filled 2018-05-12 (×3): qty 4

## 2018-05-12 MED ORDER — METOPROLOL TARTRATE 25 MG PO TABS
25.0000 mg | ORAL_TABLET | Freq: Two times a day (BID) | ORAL | Status: DC
  Administered 2018-05-12: 25 mg via ORAL
  Filled 2018-05-12: qty 1

## 2018-05-12 MED ORDER — METOPROLOL TARTRATE 25 MG/10 ML ORAL SUSPENSION: 25.0000 mg | Freq: Two times a day (BID) | ORAL | Status: DC

## 2018-05-12 MED ORDER — INSULIN DETEMIR 100 UNIT/ML ~~LOC~~ SOLN
14.0000 [IU] | Freq: Two times a day (BID) | SUBCUTANEOUS | Status: DC
  Administered 2018-05-12 – 2018-05-17 (×11): 14 [IU] via SUBCUTANEOUS
  Filled 2018-05-12 (×12): qty 0.14

## 2018-05-12 MED ORDER — HYDRALAZINE HCL 25 MG PO TABS
25.0000 mg | ORAL_TABLET | Freq: Two times a day (BID) | ORAL | Status: DC
  Administered 2018-05-12: 25 mg via ORAL
  Filled 2018-05-12: qty 1

## 2018-05-12 MED ORDER — CLONIDINE HCL 0.1 MG/24HR TD PTWK
0.1000 mg | MEDICATED_PATCH | TRANSDERMAL | Status: DC
  Administered 2018-05-12: 0.1 mg via TRANSDERMAL
  Filled 2018-05-12: qty 1

## 2018-05-12 MED ORDER — ORAL CARE MOUTH RINSE: 15.0000 mL | Freq: Two times a day (BID) | OROMUCOSAL | Status: DC

## 2018-05-12 MED ORDER — INSULIN ASPART 100 UNIT/ML ~~LOC~~ SOLN
0.0000 [IU] | SUBCUTANEOUS | Status: DC
  Administered 2018-05-12 (×2): 2 [IU] via SUBCUTANEOUS
  Administered 2018-05-12: 8 [IU] via SUBCUTANEOUS
  Administered 2018-05-13: 2 [IU] via SUBCUTANEOUS

## 2018-05-12 MED FILL — Mannitol IV Soln 20%: INTRAVENOUS | Qty: 500 | Status: AC

## 2018-05-12 MED FILL — Electrolyte-R (PH 7.4) Solution: INTRAVENOUS | Qty: 3000 | Status: AC

## 2018-05-12 MED FILL — Sodium Chloride IV Soln 0.9%: INTRAVENOUS | Qty: 2000 | Status: AC

## 2018-05-12 MED FILL — Heparin Sodium (Porcine) Inj 1000 Unit/ML: INTRAMUSCULAR | Qty: 90 | Status: AC

## 2018-05-12 MED FILL — Lidocaine HCl Local Soln Prefilled Syringe 100 MG/5ML (2%): INTRAMUSCULAR | Qty: 5 | Status: AC

## 2018-05-12 MED FILL — Sodium Bicarbonate IV Soln 8.4%: INTRAVENOUS | Qty: 50 | Status: AC

## 2018-05-12 NOTE — Progress Notes (Signed)
Patient ID: Alejandro [name], male   DOB: September 17, 1941, 77 y.o.   MRN: [id number] EVENING ROUNDS NOTE :     [address]       [place] [address]             [phone]                 1 Day Post-Op Procedure(s) (LRB): CORONARY ARTERY BYPASS GRAFTING (CABG), on pump, times four, using left internal mammary artery and endoscopically harvested right greater saphenous leg vein. (N/A) TRANSESOPHAGEAL ECHOCARDIOGRAM (TEE) (N/A)  Total Length of Stay:  LOS: 10 days  BP 128/70   Pulse (!) 105   Temp 97.8 F (36.6 C) (Oral)   Resp (!) 21   Ht 5\' 11"  (1.803 m)   Wt 247 lb 5.7 oz (112.2 kg)   SpO2 93%   BMI 34.50 kg/m   .Intake/Output      06/11 0701 - 06/12 0700 06/12 0701 - 06/13 0700   P.O. 120 960   I.V. (mL/kg) 4118 (36.7) 430.2 (3.8)   Blood 824    IV Piggyback 1426.7 510   Total Intake(mL/kg) 6488.6 (57.8) 1900.2 (16.9)   Urine (mL/kg/hr) 3125 (1.2) 1205 (0.9)   Blood 900    Chest Tube 266 110   Total Output 4291 1315   Net +2197.6 +585.2          . sodium chloride Stopped (05/12/18 0928)  . sodium chloride    . sodium chloride Stopped (05/12/18 0750)  . sodium chloride    . cefUROXime (ZINACEF)  IV Stopped (05/12/18 1307)  . insulin (NOVOLIN-R) infusion Stopped (05/12/18 1223)  . lactated ringers 10 mL/hr at 05/12/18 1800  . lactated ringers Stopped (05/12/18 0804)  . milrinone 0.25 mcg/kg/min (05/12/18 1800)  . nitroGLYCERIN Stopped (05/12/18 1239)  . potassium chloride 10 mEq (05/12/18 1841)     Lab Results  Component Value Date   WBC 18.0 (H) 05/12/2018   HGB 11.6 (L) 05/12/2018   HCT 34.0 (L) 05/12/2018   PLT 169 05/12/2018   GLUCOSE 206 (H) 05/12/2018   ALT 22 05/10/2018   AST 19 05/10/2018   NA 134 (L) 05/12/2018   K 3.6 05/12/2018   CL 98 (L) 05/12/2018   CREATININE 0.90 05/12/2018   BUN 13 05/12/2018   CO2 24 05/12/2018   INR 1.31 05/11/2018   HGBA1C 6.0 (H) 05/03/2018   cabg yesterday Walked around unit On  milrinone   Grace Isaac MD  Beeper [phone] Office [phone] 05/12/2018 6:43 PM

## 2018-05-12 NOTE — Procedures (Addendum)
Extubation Procedure Note  Patient Details:   Name: Alejandro [name] DOB: 02/25/1941 MRN: [id number]   Airway Documentation:  Airway 7.5 mm (Active)  Secured at (cm) 22 cm 05/11/2018 11:20 PM  Measured From Lips 05/11/2018 11:20 PM  Secured Location Right 05/11/2018 11:20 PM  Secured By Pink Tape 05/11/2018 11:20 PM  Site Condition Dry 05/11/2018  7:30 PM   Vent end date: 05/12/2018  Vent end time 00:05   Evaluation  O2 sats: stable throughout Complications: No apparent complications Patient did tolerate procedure well. Bilateral Breath Sounds: Clear   Patient extubated to 4 lpm [place]. Tolerated well. Able to vocalize post extubation. NIF -40   VC 1.2L  Airway/Oral suction performed prior to extubation. Good Effort on IS post extubation >529ml  Positive cuff leak noted.  Catha Brow 05/12/2018, 12:19 AM

## 2018-05-12 NOTE — Progress Notes (Signed)
1 Day Post-Op Procedure(s) (LRB): CORONARY ARTERY BYPASS GRAFTING (CABG), on pump, times four, using left internal mammary artery and endoscopically harvested right greater saphenous leg vein. (N/A) TRANSESOPHAGEAL ECHOCARDIOGRAM (TEE) (N/A) Subjective: Hypertensive after cABGx 4 nsr  Objective: Vital signs in last 24 hours: Temp:  [96.6 F (35.9 C)-99.3 F (37.4 C)] 98.6 F (37 C) (06/12 0815) Pulse Rate:  [60-107] 105 (06/12 0745) Cardiac Rhythm: Sinus tachycardia (06/12 0745) Resp:  [12-25] 21 (06/12 0815) BP: (96-151)/(52-93) 136/80 (06/12 0815) SpO2:  [92 %-100 %] 92 % (06/12 0815) Arterial Line BP: (100-192)/(53-81) 153/69 (06/12 0815) FiO2 (%):  [40 %-50 %] 40 % (06/12 0000) Weight:  [247 lb 5.7 oz (112.2 kg)] 247 lb 5.7 oz (112.2 kg) (06/12 0500)  Hemodynamic parameters for last 24 hours: PAP: (12-38)/(-8-24) 27/13 CO:  [6 L/min-8 L/min] 6 L/min CI:  [2.6 L/min/m2-3.5 L/min/m2] 2.6 L/min/m2  Intake/Output from previous day: 06/11 0701 - 06/12 0700 In: 6488.6 [P.O.:120; I.V.:4118; Blood:824; IV Piggyback:1426.7] Out: 4827 [Urine:3125; Blood:900; Chest Tube:266] Intake/Output this shift: Total I/O In: 106.9 [I.V.:106.9] Out: 70 [Urine:70]       Exam    General- alert and comfortable    Neck- no JVD, no cervical adenopathy palpable, no carotid bruit   Lungs- clear without rales, wheezes   Cor- regular rate and rhythm, no murmur , gallop   Abdomen- soft, non-tender   Extremities - warm, non-tender, minimal edema   Neuro- oriented, appropriate, no focal weakness   Lab Results: Recent Labs    05/11/18 1856  05/12/18 0409 05/12/18 0410  WBC 23.6*  --   --  14.6*  HGB 11.5*   < > 12.2* 12.0*  HCT 35.0*   < > 36.0* 35.8*  PLT 176  --   --  192   < > = values in this interval not displayed.   BMET:  Recent Labs    05/10/18 0217  05/12/18 0409 05/12/18 0410  NA 136   < > 138 136  K 4.0   < > 4.0 4.0  CL 104   < > 100* 107  CO2 23  --   --  24   GLUCOSE 130*   < > 154* 149*  BUN 19   < > 14 13  CREATININE 1.22   < > 0.90 1.07  CALCIUM 9.1  --   --  7.8*   < > = values in this interval not displayed.    PT/INR:  Recent Labs    05/11/18 1856  LABPROT 16.2*  INR 1.31   ABG    Component Value Date/Time   PHART 7.376 05/12/2018 0117   HCO3 23.1 05/12/2018 0117   TCO2 22 05/12/2018 0409   ACIDBASEDEF 2.0 05/12/2018 0117   O2SAT 98.0 05/12/2018 0117   CBG (last 3)  Recent Labs    05/12/18 0500 05/12/18 0601 05/12/18 0655  GLUCAP 141* 119* 102*    Assessment/Plan: S/P Procedure(s) (LRB): CORONARY ARTERY BYPASS GRAFTING (CABG), on pump, times four, using left internal mammary artery and endoscopically harvested right greater saphenous leg vein. (N/A) TRANSESOPHAGEAL ECHOCARDIOGRAM (TEE) (N/A) Mobilize See progression orders Diuresis DM control   LOS: 10 days    Tharon Aquas Trigt III 05/12/2018

## 2018-05-12 NOTE — Addendum Note (Signed)
Addendum  created 05/12/18 0941 by Roderic Palau, MD   Diagnosis association updated

## 2018-05-12 NOTE — Progress Notes (Signed)
Dr Prescott Gum called re: elevated BP. Adjustments made to medications.

## 2018-05-12 NOTE — Progress Notes (Signed)
Dr.Van Trigt called, gave update, pt extubated, Levo off, pt alert, urine output avg 122ml/hr, MD stated to cut Dopamine in half and d/c at 6am.

## 2018-05-12 NOTE — Progress Notes (Signed)
Initial Nutrition Assessment  Nutrition Brief Note  Patient LOS day 10.   Wt Readings from Last 15 Encounters:  05/12/18 247 lb 5.7 oz (112.2 kg)  01/23/15 273 lb 9.6 oz (124.1 kg)  12/20/13 287 lb 12.8 oz (130.5 kg)  06/14/13 280 lb (127 kg)  03/29/13 280 lb (127 kg)  03/02/13 284 lb 3.2 oz (128.9 kg)  03/01/13 280 lb (127 kg)  09/02/12 275 lb 9.6 oz (125 kg)  02/04/12 282 lb (127.9 kg)  01/08/12 282 lb (127.9 kg)  07/03/11 279 lb (126.6 kg)    Body mass index is 34.5 kg/m. Patient meets criteria for Obesity based on current BMI.   Current diet order is clear liquids, patient is consuming approximately 50% of meals at this time. Pt eating 75-100% prior to surgery. Pt reports no problems with PO intake PTA or during admission. Pt reports no recent weight loss. Pt reports "I'm really mad right now, don't mess with me". Pt reports wanting to eat. Did not give detailed nutrition or weight hx. Per chart pt weight stable since admission with some weight fluctuations likely due to fluid changes. No weight in chart since 2016.  Labs and medications reviewed.  No nutrition interventions warranted at this time. If nutrition issues arise, please consult RD.   Hope Budds, Dietetic Intern

## 2018-05-13 ENCOUNTER — Inpatient Hospital Stay: Payer: Self-pay

## 2018-05-13 ENCOUNTER — Inpatient Hospital Stay (HOSPITAL_COMMUNITY): Payer: Medicare Other

## 2018-05-13 LAB — BASIC METABOLIC PANEL
Anion gap: 10 (ref 5–15)
BUN: 13 mg/dL (ref 6–20)
CO2: 24 mmol/L (ref 22–32)
Calcium: 8.3 mg/dL — ABNORMAL LOW (ref 8.9–10.3)
Chloride: 102 mmol/L (ref 101–111)
Creatinine, Ser: 1.13 mg/dL (ref 0.61–1.24)
GFR calc Af Amer: >60 mL/min (ref >60)
GFR calc non Af Amer: >60 mL/min (ref >60)
Glucose, Bld: 152 mg/dL — ABNORMAL HIGH (ref 65–99)
Potassium: 3.9 mmol/L (ref 3.5–5.1)
Sodium: 136 mmol/L (ref 135–145)

## 2018-05-13 LAB — GLUCOSE, CAPILLARY
Glucose-Capillary: 119 mg/dL — ABNORMAL HIGH (ref 65–99)
Glucose-Capillary: 138 mg/dL — ABNORMAL HIGH (ref 65–99)
Glucose-Capillary: 153 mg/dL — ABNORMAL HIGH (ref 65–99)
Glucose-Capillary: 162 mg/dL — ABNORMAL HIGH (ref 65–99)
Glucose-Capillary: 165 mg/dL — ABNORMAL HIGH (ref 65–99)
Glucose-Capillary: 179 mg/dL — ABNORMAL HIGH (ref 65–99)

## 2018-05-13 LAB — CBC
HCT: 34.9 % — ABNORMAL LOW (ref 39.0–52.0)
Hemoglobin: 11.5 g/dL — ABNORMAL LOW (ref 13.0–17.0)
MCH: 29.4 pg (ref 26.0–34.0)
MCHC: 33 g/dL (ref 30.0–36.0)
MCV: 89.3 fL (ref 78.0–100.0)
Platelets: 166 10*3/uL (ref 150–400)
RBC: 3.91 MIL/uL — ABNORMAL LOW (ref 4.22–5.81)
RDW: 15.4 % (ref 11.5–15.5)
WBC: 16.6 10*3/uL — ABNORMAL HIGH (ref 4.0–10.5)

## 2018-05-13 MED ORDER — AMIODARONE HCL IN DEXTROSE 360-4.14 MG/200ML-% IV SOLN
60.0000 mg/h | INTRAVENOUS | Status: AC
  Administered 2018-05-13: 60 mg/h via INTRAVENOUS
  Filled 2018-05-13: qty 200

## 2018-05-13 MED ORDER — ALPRAZOLAM 0.5 MG PO TABS
0.5000 mg | ORAL_TABLET | Freq: Every day | ORAL | Status: DC
  Administered 2018-05-13 – 2018-05-16 (×4): 0.5 mg via ORAL
  Filled 2018-05-13 (×4): qty 1

## 2018-05-13 MED ORDER — INSULIN ASPART 100 UNIT/ML ~~LOC~~ SOLN
0.0000 [IU] | Freq: Three times a day (TID) | SUBCUTANEOUS | Status: DC
  Administered 2018-05-13: 3 [IU] via SUBCUTANEOUS
  Administered 2018-05-13: 2 [IU] via SUBCUTANEOUS
  Administered 2018-05-13 – 2018-05-14 (×3): 3 [IU] via SUBCUTANEOUS
  Administered 2018-05-14 – 2018-05-16 (×3): 2 [IU] via SUBCUTANEOUS

## 2018-05-13 MED ORDER — FUROSEMIDE 10 MG/ML IJ SOLN: 40.0000 mg | Freq: Every day | INTRAMUSCULAR | Status: DC

## 2018-05-13 MED ORDER — SODIUM CHLORIDE 0.9% FLUSH
3.0000 mL | Freq: Two times a day (BID) | INTRAVENOUS | Status: DC
  Administered 2018-05-13 – 2018-05-16 (×6): 3 mL via INTRAVENOUS

## 2018-05-13 MED ORDER — INSULIN ASPART 100 UNIT/ML ~~LOC~~ SOLN: 0.0000 [IU] | Freq: Every day | SUBCUTANEOUS | Status: DC

## 2018-05-13 MED ORDER — POTASSIUM CHLORIDE CRYS ER 20 MEQ PO TBCR
20.0000 meq | EXTENDED_RELEASE_TABLET | Freq: Two times a day (BID) | ORAL | Status: DC
  Administered 2018-05-13 – 2018-05-17 (×9): 20 meq via ORAL
  Filled 2018-05-13 (×9): qty 1

## 2018-05-13 MED ORDER — AMIODARONE HCL IN DEXTROSE 360-4.14 MG/200ML-% IV SOLN
30.0000 mg/h | INTRAVENOUS | Status: DC
  Administered 2018-05-13: 30 mg/h via INTRAVENOUS
  Filled 2018-05-13: qty 200

## 2018-05-13 MED ORDER — SODIUM CHLORIDE 0.9% FLUSH
3.0000 mL | INTRAVENOUS | Status: DC | PRN
  Administered 2018-05-15: 3 mL via INTRAVENOUS
  Filled 2018-05-13: qty 3

## 2018-05-13 MED ORDER — SODIUM CHLORIDE 0.9 % IV SOLN: 250.0000 mL | INTRAVENOUS | Status: DC | PRN

## 2018-05-13 MED ORDER — MOVING RIGHT ALONG BOOK
Freq: Once | Status: AC
  Administered 2018-05-13: 12:00:00
  Filled 2018-05-13: qty 1

## 2018-05-13 MED FILL — Potassium Chloride Inj 2 mEq/ML: INTRAVENOUS | Qty: 40 | Status: AC

## 2018-05-13 MED FILL — Heparin Sodium (Porcine) Inj 1000 Unit/ML: INTRAMUSCULAR | Qty: 30 | Status: AC

## 2018-05-13 MED FILL — Magnesium Sulfate Inj 50%: INTRAMUSCULAR | Qty: 10 | Status: AC

## 2018-05-13 NOTE — Progress Notes (Signed)
Patient transported to 4E08 via wheelchair with NT Amber. Esmeralda Links at bedside. During transfer noted that patient converted into Afib with rates 110-130. Call placed to receiving RN and nurse made aware.   Dr Prescott Gum called and made aware. New orders to follow.

## 2018-05-13 NOTE — Care Management Note (Signed)
Case Management Note Marvetta Gibbons RN,BSN Unit [facility] 1-22 Case Manager  [phone]   Patient Details  Name: GAILEN VENNE MRN: [id number] Date of Birth: 05/22/1941  Subjective/Objective:  Pt admitted with NTEMI, MVD found- s/p CABGx4 on 6/11                  Action/Plan: PTA Pt lived at home, CM to follow for transition of care needs  Expected Discharge Date:  05/04/18               Expected Discharge Plan:  Home/Self Care  In-House Referral:     Discharge planning Services  CM Consult  Post Acute Care Choice:  NA Choice offered to:  NA  DME Arranged:    DME Agency:     HH Arranged:    [place] Agency:     Status of Service:  Completed, signed off  If discussed at [place] of Stay Meetings, dates discussed:    Discharge Disposition:   Additional Comments:  Dawayne Patricia, RN 05/13/2018, 10:50 AM

## 2018-05-13 NOTE — Progress Notes (Addendum)
TCTS DAILY ICU PROGRESS NOTE                   [employer]            [place], [address]          [phone]   2 Days Post-Op Procedure(s) (LRB): CORONARY ARTERY BYPASS GRAFTING (CABG), on pump, times four, using left internal mammary artery and endoscopically harvested right greater saphenous leg vein. (N/A) TRANSESOPHAGEAL ECHOCARDIOGRAM (TEE) (N/A)  Total Length of Stay:  LOS: 11 days   Subjective: No issues overnight. He would like to get back into the bed since he didn't get much sleep last night.   Objective: Vital signs in last 24 hours: Temp:  [97.8 F (36.6 C)-99 F (37.2 C)] 98.5 F (36.9 C) (06/13 0736) Pulse Rate:  [96] 96 (06/12 2208) Cardiac Rhythm: Normal sinus rhythm (06/13 0730) Resp:  [7-27] 19 (06/13 0800) BP: (109-168)/(69-120) 144/77 (06/13 0800) SpO2:  [91 %-97 %] 95 % (06/13 0800) Arterial Line BP: (108-167)/(60-72) 167/65 (06/12 1230) FiO2 (%):  [40 %] 40 % (06/13 0000) Weight:  [111.7 kg (246 lb 4.1 oz)] 111.7 kg (246 lb 4.1 oz) (06/13 0432)  Filed Weights   05/11/18 0349 05/12/18 0500 05/13/18 0432  Weight: 107.4 kg (236 lb 12.8 oz) 112.2 kg (247 lb 5.7 oz) 111.7 kg (246 lb 4.1 oz)    Weight change: -0.5 kg (-1 lb 1.6 oz)   Hemodynamic parameters for last 24 hours: PAP: (19-22)/(7-12) 22/9  Intake/Output from previous day: 06/12 0701 - 06/13 0700 In: 2338.5 [P.O.:960; I.V.:668.5; IV Piggyback:710] Out: 2330 [Urine:2220; Chest Tube:110]  Intake/Output this shift: Total I/O In: 18.1 [I.V.:18.1] Out: -   Current Meds: Scheduled Meds: . acetaminophen  1,000 mg Oral Q6H   Or  . acetaminophen (TYLENOL) oral liquid 160 mg/5 mL  1,000 mg Per Tube Q6H  . aspirin EC  325 mg Oral Daily   Or  . aspirin  324 mg Per Tube Daily  . atorvastatin  80 mg Oral q1800  . bisacodyl  10 mg Oral Daily   Or  . bisacodyl  10 mg Rectal Daily  . cloNIDine  0.2 mg Oral BID  . docusate sodium  200 mg Oral Daily  . furosemide  40 mg  Intravenous BID  . hydrALAZINE  25 mg Oral Q8H  . insulin aspart  0-15 Units Subcutaneous TID WC  . insulin aspart  0-5 Units Subcutaneous QHS  . insulin detemir  14 Units Subcutaneous BID  . metoCLOPramide (REGLAN) injection  10 mg Intravenous Q6H  . metoprolol tartrate  50 mg Oral BID  . pantoprazole  40 mg Oral Daily  . sodium chloride flush  3 mL Intravenous Q12H   Continuous Infusions: . sodium chloride Stopped (05/12/18 0928)  . sodium chloride    . sodium chloride Stopped (05/12/18 0750)  . sodium chloride    . cefUROXime (ZINACEF)  IV Stopped (05/13/18 0053)  . lactated ringers 10 mL/hr at 05/13/18 0800  . lactated ringers Stopped (05/12/18 0804)   PRN Meds:.sodium chloride, fentaNYL (SUBLIMAZE) injection, labetalol, ondansetron (ZOFRAN) IV, oxyCODONE, sodium chloride flush, traMADol  General appearance: alert, cooperative and no distress Heart: regular rate and rhythm, S1, S2 normal, no murmur, click, rub or gallop Lungs: clear to auscultation bilaterally Abdomen: soft, non-tender; bowel sounds normal; no masses,  no organomegaly Extremities: extremities normal, atraumatic, no cyanosis or edema Wound: clean and dry covered with a sterile dressing  Lab Results: CBC:  Recent Labs    05/12/18 1637 05/12/18 1639 05/13/18 0426  WBC 18.0*  --  16.6*  HGB 11.9* 11.6* 11.5*  HCT 35.3* 34.0* 34.9*  PLT 169  --  166   BMET:  Recent Labs    05/12/18 0410  05/12/18 1639 05/13/18 0426  NA 136  --  134* 136  K 4.0  --  3.6 3.9  CL 107  --  98* 102  CO2 24  --   --  24  GLUCOSE 149*  --  206* 152*  BUN 13  --  13 13  CREATININE 1.07   < > 0.90 1.13  CALCIUM 7.8*  --   --  8.3*   < > = values in this interval not displayed.    CMET: Lab Results  Component Value Date   WBC 16.6 (H) 05/13/2018   HGB 11.5 (L) 05/13/2018   HCT 34.9 (L) 05/13/2018   PLT 166 05/13/2018   GLUCOSE 152 (H) 05/13/2018   ALT 22 05/10/2018   AST 19 05/10/2018   NA 136 05/13/2018   K 3.9  05/13/2018   CL 102 05/13/2018   CREATININE 1.13 05/13/2018   BUN 13 05/13/2018   CO2 24 05/13/2018   INR 1.31 05/11/2018   HGBA1C 6.0 (H) 05/03/2018      PT/INR:  Recent Labs    05/11/18 1856  LABPROT 16.2*  INR 1.31   Radiology: Dg Chest Port 1 View  Result Date: 05/13/2018 CLINICAL DATA:  Shortness of Breath EXAM: PORTABLE CHEST 1 VIEW COMPARISON:  05/12/2018 FINDINGS: Interval removal of left chest tube and Swan-Ganz catheter. Cardiomegaly with vascular congestion. Bibasilar atelectasis. Probable small left effusion. No pneumothorax. IMPRESSION: Interval removal of left chest tube without pneumothorax. Cardiomegaly, vascular congestion. Bibasilar atelectasis and small left effusion. Electronically Signed   By: Rolm Baptise M.D.   On: 05/13/2018 07:25     Assessment/Plan: S/P Procedure(s) (LRB): CORONARY ARTERY BYPASS GRAFTING (CABG), on pump, times four, using left internal mammary artery and endoscopically harvested right greater saphenous leg vein. (N/A) TRANSESOPHAGEAL ECHOCARDIOGRAM (TEE) (N/A)  1. CV-BP a little better this morning. Continue Clonidine, hydralazine, and metoprolol. PRN labetalol. HR is NSR in the 90s but is having frequent PVCs.  2. Pulm-CXR showed no pneumothorax. Bibasilar atelectasis and small left effusion.  3. Renal-creatinine 1.13, electrolytes are okay. Weight continues to trend down. Good urine output.  4. H and H-11.5/34.9 5. Platelets 166k 6. Endo-blood glucose well controlled on the current regimen.   Plan: Could increase metoprolol if ectopy continues. Discontinue foley catheter and central line. Ambulate in the halls TID. Not on any drips. Could potentially move to the floor today.     [name] 05/13/2018 8:20 AM   Progressing after CABG for NONSTEMI tx to stepdown patient examined and medical record reviewed,agree with above note. Tharon Aquas Trigt III 05/13/2018

## 2018-05-13 NOTE — Discharge Summary (Addendum)
[address]       [place] [address]             [phone]      Physician Discharge Summary  Patient ID: Alejandro [name] MRN: [id number] DOB/AGE: 1941/09/16 77 y.o.  Admit date: 05/02/2018 Discharge date: 05/17/2018  Admission Diagnoses: Patient Active Problem List   Diagnosis Date Noted  . CKD (chronic kidney disease), stage III ([place]) 05/07/2018  . Obesity 05/07/2018  . Aortic stenosis 05/07/2018  . Non-ST elevation (NSTEMI) myocardial infarction ([place]) 05/07/2018  . CAD (coronary artery disease) 05/07/2018  . Acute diastolic CHF (congestive heart failure) ([place])   . SOB (shortness of breath)   . Leukocytosis 05/02/2018  . Flash pulmonary edema ([place]) 05/02/2018  . Diabetic neuropathy with neurologic complication ([place]) 88/91/6945  . Spinal stenosis of lumbar region 03/29/2013  . Melena 03/02/2013  . GERD (gastroesophageal reflux disease) 09/02/2012  . Helicobacter pylori (H. pylori) 09/02/2012  . Hypertension 01/08/2012  . Diabetes mellitus ([place]) 01/08/2012  . High cholesterol     Discharge Diagnoses:  Principal Problem:   Non-ST elevation (NSTEMI) myocardial infarction [facility]) Active Problems:   Hypertension   Diabetes mellitus (HCC)   High cholesterol   GERD (gastroesophageal reflux disease)   Diabetic neuropathy with neurologic complication (HCC)   Leukocytosis   Flash pulmonary edema (HCC)   SOB (shortness of breath)   Acute diastolic CHF (congestive heart failure) (HCC)   CKD (chronic kidney disease), stage III (HCC)   Obesity   Aortic stenosis   CAD (coronary artery disease)   S/P CABG x 4   Discharged Condition: good  HPI:   Alejandro [name] is a 77 yo obese white male with known history of Hypertension, diabetes, CKD, IBS, and Lyme Disease.  The patient developed acute onset shortness of breath.  It started 3 days prior to admission but became acutely worse on day of presentation.  The patient was walking back to his truck when he became  very short of breath, exhausted, and felt lousy overall.  He was transported to the ED for evaluation.  On evaluation he denied orthopnea, wheezing, and fevers.  Workup in the ED showed a mildly elevated Troponin level.  EKG was unremarkable.  CT scan of the chest showed pleural effusions, but no evidence of PE.  He was treated with IV lasix in the ED with 1.8L of fluid output.  He was ruled in for NSTEMI and transported to [facility] for further care.  He continued to get diuretics for acute diastolic CHF.  He underwent cardiac catheterization which showed multivessel CAD.  It was felt coronary bypass grafting would be indicated and TCTS consult was requested.  The patient is currently chest pain free.  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  He thought his shortness of breath was related to his new diagnosis of lyme disease.  He is agreeable to having surgery, but he would like to go home prior to surgery.    Hospital Course:   Alejandro [name] underwent a coronary bypass grafting x4 on 05/11/2018.  He tolerated the procedure well and was transferred to the surgical ICU for continued care.  He was extubated timely manner.  Postop day 1 he was hypertensive.  We initiated his home hypertension medications.  We began to mobilize the patient.  We initiated a diuretic regimen for fluid overload.  Continue to strictly control the patient's blood glucose level.  Postop day  2 his blood pressure was improved on clonidine, hydralazine, metoprolol.  He remained in normal sinus rhythm but was having frequent PVCs.  His creatinine remained stable.  His weight continued to trend down.  His hemoglobin and hematocrit remained stable as well as his platelet count.  We discontinue his Foley catheter and central line today.  He was stable to transfer to the telemetry floor for continued care. The patient was weaned off supplemental oxygen and was ambulating on his own. He did have some post-op afib  and was started on Amio protocol. His beta blocker was increased. His epicardial wires were removed on 6/16 without issue. He remained stable and in NSR. He is stable for discharge today.   Consults: None  Significant Diagnostic Studies:   CLINICAL DATA:  Shortness of Breath  EXAM: PORTABLE CHEST 1 VIEW  COMPARISON:  05/12/2018  FINDINGS: Interval removal of left chest tube and Swan-Ganz catheter. Cardiomegaly with vascular congestion. Bibasilar atelectasis. Probable small left effusion. No pneumothorax.  IMPRESSION: Interval removal of left chest tube without pneumothorax.  Cardiomegaly, vascular congestion.  Bibasilar atelectasis and small left effusion.   Electronically Signed   By: Rolm Baptise M.D.   On: 05/13/2018 07:25   Treatments:  NAME: BLAYZE, HAEN MEDICAL RECORD WN:[id number] ACCOUNT [id number] DATE OF BIRTH:[birth year] FACILITY: [facility] LOCATION: [facility] PHYSICIAN:Czar Ysaguirre VAN TRIGT III, MD  OPERATIVE REPORT  DATE OF PROCEDURE:  05/11/2018  PROCEDURE PERFORMED: 1.  Coronary artery bypass grafting x4 (left internal mammary artery to left anterior descending, saphenous vein graft to diagonal, saphenous vein graft to the obtuse marginal, saphenous vein graft to posterior descending). 2.  Endoscopic harvest of right leg greater saphenous vein.  SURGEON:  Tharon Aquas Trigt III, MD  ASSISTANT:  Nicholes Rough, PA-C   ANESTHESIA:  General.  PREOPERATIVE DIAGNOSES:  Severe 3-vessel coronary artery disease, non-ST elevation with congestive heart failure, severe 3-vessel coronary artery disease and mild LV dysfunction, hypertension.  POSTOPERATIVE DIAGNOSES:  Severe 3-vessel coronary artery disease, non-ST elevation with congestive heart failure, severe 3-vessel coronary artery disease and mild LV dysfunction, hypertension.      Discharge Exam: Blood pressure (!) 149/79, pulse 72, temperature 98.2 F (36.8 C), temperature source Oral, resp.  rate 18, height 5\' 11"  (1.803 m), weight 110.3 kg (243 lb 1.6 oz), SpO2 96 %.   General appearance: alert, cooperative and no distress Heart: regular rate and rhythm, S1, S2 normal, no murmur, click, rub or gallop Lungs: clear to auscultation bilaterally Abdomen: soft, non-tender; bowel sounds normal; no masses,  no organomegaly Extremities: extremities normal, atraumatic, no cyanosis or edema Wound: clean and dry    Disposition: Discharge disposition: 01-Home or Self Care       Discharge Instructions    Amb Referral to Cardiac Rehabilitation   Complete by:  As directed    Diagnosis:   CABG NSTEMI     CABG X ___:  4     Allergies as of 05/17/2018   No Known Allergies     Medication List    STOP taking these medications   azithromycin 250 MG tablet Commonly known as:  ZITHROMAX   nebivolol 5 MG tablet Commonly known as:  BYSTOLIC     TAKE these medications   ALPRAZolam 0.5 MG tablet Commonly known as:  XANAX Take 0.5 mg by mouth at bedtime as needed for sleep.   amiodarone 200 MG tablet Commonly known as:  PACERONE Please take 2 tabs (400mg ) twice a day for three days then 1 tab (  200mg ) twice a day until we see you in the office.   aspirin 325 MG EC tablet Take 1 tablet (325 mg total) by mouth daily. What changed:    medication strength  how much to take   atorvastatin 80 MG tablet Commonly known as:  LIPITOR Take 1 tablet (80 mg total) by mouth daily at 6 PM.   cloNIDine 0.2 MG tablet Commonly known as:  CATAPRES Take 1 tablet (0.2 mg total) by mouth 2 (two) times daily. What changed:    medication strength  how much to take  when to take this   DEXILANT 60 MG capsule Generic drug:  dexlansoprazole TAKE ONE CAPSULE BY MOUTH EVERY DAY.   hydrALAZINE 25 MG tablet Commonly known as:  APRESOLINE Take 1 tablet (25 mg total) by mouth every 8 (eight) hours. What changed:  when to take this   metoprolol tartrate 50 MG tablet Commonly known as:   LOPRESSOR Take 1 tablet (50 mg total) by mouth 2 (two) times daily.   oxyCODONE 5 MG immediate release tablet Commonly known as:  Oxy IR/ROXICODONE Take 1 tablet (5 mg total) by mouth every 6 (six) hours as needed for severe pain.   sitaGLIPtin-metformin 50-1000 MG tablet Commonly known as:  JANUMET Take 1 tablet by mouth 2 (two) times daily with a meal.      Follow-up Information    Satira Sark, MD Follow up.   Specialty:  Cardiology Why:  After your bypass surgery you will need to follow up with Dr. [name]. The office should be in touch with you to arrange Contact information: [place] [address] [phone]        Ivin Poot, MD Follow up.   Specialty:  Cardiothoracic Surgery Why:  Your follow-up appointment is on 06/16/2018 at 12:30pm. Please arrive at 12:00pm for a follow-up chest xray located at [facility] which is located on the first floor of our building.  Contact information: [address] [place] [address] [phone]        Celene Squibb, MD. Call in 1 day(s).   Specialty:  Internal Medicine Contact information[place]arth[place] [id number]          The patient has been discharged on:   1.Beta Blocker:  Yes [ x  ]                              No   [   ]                              If No, reason:  2.Ace Inhibitor/ARB: Yes [   ]                                     No  [ x   ]                                     If No, reason: titration of other medication  3.Statin:   Yes [ x  ]                  No  [   ]  If No, reason:  4.Ecasa:  Yes  [ x  ]                  No   [   ]                  If No, reason:    Signed: [name] 05/17/2018, 10:07 AM   patient examined and medical record reviewed,agree with above note. Tharon Aquas Trigt III 06/04/2018

## 2018-05-13 NOTE — Discharge Instructions (Signed)

## 2018-05-13 NOTE — Op Note (Signed)
NAME: Alejandro [name], Alejandro [name] MEDICAL RECORD ZO:[id number] ACCOUNT [id number] DATE OF BIRTH:1941/06/05 FACILITY: [facility] LOCATION: [facility] PHYSICIAN:Gill Delrossi VAN TRIGT III, MD  OPERATIVE REPORT  DATE OF PROCEDURE:  05/11/2018  PROCEDURE PERFORMED: 1.  Coronary artery bypass grafting x4 (left internal mammary artery to left anterior descending, saphenous vein graft to diagonal, saphenous vein graft to the obtuse marginal, saphenous vein graft to posterior descending). 2.  Endoscopic harvest of right leg greater saphenous vein.  SURGEON:  Tharon Aquas Trigt III, MD  ASSISTANT:  Nicholes Rough, PA-C   ANESTHESIA:  General.  PREOPERATIVE DIAGNOSES:  Severe 3-vessel coronary artery disease, non-ST elevation with congestive heart failure, severe 3-vessel coronary artery disease and mild LV dysfunction, hypertension.  POSTOPERATIVE DIAGNOSES:  Severe 3-vessel coronary artery disease, non-ST elevation with congestive heart failure, severe 3-vessel coronary artery disease and mild LV dysfunction, hypertension.  CLINICAL NOTE:  The patient is a 77 year old nonsmoker who presented to the hospital with shortness of breath and pulmonary edema and positive cardiac enzymes.  He underwent cardiac catheterization which demonstrated severe 3-vessel coronary artery  disease with left ventricular hypertrophy.  Echo showed no significant valvular disease.  His cardiologist recommended surgical coronary revascularization, and I agreed with that recommendation.  I discussed the procedure of CABG in detail with the  patient including the expected benefits, alternatives, risks and expected recovery.  He understood the use of general anesthesia, the use of cardiopulmonary bypass, and the location of the surgical incisions.  He understood after our discussion of risks,  the potential risks of stroke, bleeding, blood transfusion requirement, infection, postoperative pulmonary problems including pleural effusion, postoperative organ  failure, and postoperative death.  After reviewing these issues, he demonstrated his  understanding and agreed to proceed with surgery and what I felt was an informed consent.  OPERATIVE FINDINGS: 1.  Significant LVH. 2.  Severe diffuse coronary artery disease; targets would be inadequate for redo surgery. 3.  Improved global LV function after coronary revascularization by TEE following separation from cardiopulmonary bypass.  DESCRIPTION OF PROCEDURE:  The patient was brought from preop holding to the operating room and placed supine on the operating table.  General anesthesia was induced under invasive hemodynamic monitoring.  The chest, abdomen and legs were prepped with  Betadine and draped as a sterile field.  A proper timeout was made.  A sternal incision was made.  The saphenous vein was harvested endoscopically from the right leg.  There were some superficial varicosities, but the deep vein in the thigh just below  the knee was adequate for use as conduit.  The left internal mammary artery was harvested as a pedicle graft from its origin at the subclavian vessels.  The sternal retractor was placed using the deep blades because of the patient's obese body habitus.   The pericardium was opened and suspended.  Pursestrings were placed in the ascending aorta and right atrium.  After the vein was harvested, the patient was heparinized, cannulated and placed on cardiopulmonary bypass.  The coronary arteries were then  identified for grafting, and the mammary artery and vein grafts were prepared for the distal anastomoses.  Cardioplegic cannulas were placed in both antegrade and retrograde cold blood cardioplegia.  The patient was cooled to 32 degrees, and the aortic  crossclamp was applied.  A liter of cold blood cardioplegia was delivered with good cardioplegic arrest and supple temperature dropped less than 14 degrees.  Cardioplegia was delivered every 20 minutes.  The distal coronary anastomoses  were performed.  The first  distal anastomosis was the posterior descending branch of the right coronary.  There is approximately 80% stenosis.  The reverse saphenous vein was sewn end-to-side with running 7-0 Prolene with  good flow through the graft.  Cardioplegia was redosed.  The second distal anastomosis was the OM branch of the left circumflex.  This was a 1.6 mm vessel, proximal 90% stenosis.  Reverse saphenous vein was sewn end-to-side with running 7-0 Prolene with good flow through the graft.  Cardioplegia was redosed.  The third distal anastomosis was to the diagonal branch of the LAD.  This was a 1.5 mm vessel, proximal 90% stenosis.  Reverse saphenous vein was sewn end-to-side with running 7-0 Prolene with good flow through the graft.  Cardioplegia was redosed.  The fourth distal anastomosis was the midportion of the LAD.  The LAD was somewhat small and had a short course.  It did have a proximal 90% stenosis.  The left IMA pedicle was brought through an opening in the left lateral pericardium.  It was brought  down onto the LAD and sewn end-to-side with a running 8-0 Prolene.  There was good flow through the anastomosis after briefly releasing the pedicle bulldog and the mammary artery.  The bulldog was reapplied, and the pedicle secured to the epicardium.   Cardioplegia was utilized.  While the crossclamp was in place, 3 proximal vein anastomoses were performed on the ascending aorta using a 4.5 mm punch and running 6-0 Prolene.  Prior to tying down the final proximal anastomosis, the air was vented from the coronaries with the usual  maneuvers on bypass.  The crossclamp was removed.  The vein grafts were deaired and opened and each had good flow, and hemostasis was documented at the proximal and distal sites.  The patient was rewarmed and reperfused, and temporary pacing wires were applied.  The lungs were expanded, and the  ventilator was resumed.  The patient was placed on low-dose  milrinone and weaned off cardiopulmonary bypass without difficulty.  Global LV function was improved by echo.  Vital signs were stable.  Protamine was administered without adverse reaction.   There was good coagulation function after protamine.  The superior pericardial fat was closed over the aorta.  Anterior mediastinal and left pleural chest tubes were placed and brought out through separate incisions.  The sternum was closed with a wire.   The patient remained stable.  The pectoralis fascia was closed with a running #1 Vicryl.  The leg incision was closed in a standard fashion.  The subcutaneous and skin layers were closed with running Vicryl as well.  Total cardiopulmonary bypass time  was 145 minutes, and the patient returned to ICU in stable condition.  LN/NUANCE  D:05/13/2018 T:05/13/2018 JOB:[id number]

## 2018-05-13 NOTE — Progress Notes (Signed)
Telemetry called to inform pt converted from A Fib to normal sinus rhythm around 1900. Heart rate at 77 and blood pressure at 144/78.  Dr. Prescott Gum was informed and ordered to continue the IV amiodarone drip tonight and pt may be transitioned to oral amio in the morning. Also, pt requested to have Xanax 0.5 mg tablet to help sleep like at home. Prescott Gum to place order.  Will continue to monitor pt.  Lupita Dawn, RN

## 2018-05-14 ENCOUNTER — Inpatient Hospital Stay (HOSPITAL_COMMUNITY): Payer: Medicare Other

## 2018-05-14 LAB — BASIC METABOLIC PANEL
Anion gap: 7 (ref 5–15)
BUN: 14 mg/dL (ref 6–20)
CO2: 25 mmol/L (ref 22–32)
Calcium: 8 mg/dL — ABNORMAL LOW (ref 8.9–10.3)
Chloride: 101 mmol/L (ref 101–111)
Creatinine, Ser: 1.09 mg/dL (ref 0.61–1.24)
GFR calc Af Amer: >60 mL/min (ref >60)
GFR calc non Af Amer: >60 mL/min (ref >60)
Glucose, Bld: 149 mg/dL — ABNORMAL HIGH (ref 65–99)
Potassium: 3.9 mmol/L (ref 3.5–5.1)
Sodium: 133 mmol/L — ABNORMAL LOW (ref 135–145)

## 2018-05-14 LAB — GLUCOSE, CAPILLARY
Glucose-Capillary: 144 mg/dL — ABNORMAL HIGH (ref 65–99)
Glucose-Capillary: 161 mg/dL — ABNORMAL HIGH (ref 65–99)
Glucose-Capillary: 170 mg/dL — ABNORMAL HIGH (ref 65–99)
Glucose-Capillary: 136 mg/dL — ABNORMAL HIGH (ref 65–99)
Glucose-Capillary: 134 mg/dL — ABNORMAL HIGH (ref 65–99)

## 2018-05-14 LAB — CBC
HCT: 33.2 % — ABNORMAL LOW (ref 39.0–52.0)
Hemoglobin: 11 g/dL — ABNORMAL LOW (ref 13.0–17.0)
MCH: 29.1 pg (ref 26.0–34.0)
MCHC: 33.1 g/dL (ref 30.0–36.0)
MCV: 87.8 fL (ref 78.0–100.0)
Platelets: 160 10*3/uL (ref 150–400)
RBC: 3.78 MIL/uL — ABNORMAL LOW (ref 4.22–5.81)
RDW: 15.6 % — ABNORMAL HIGH (ref 11.5–15.5)
WBC: 14.7 10*3/uL — ABNORMAL HIGH (ref 4.0–10.5)

## 2018-05-14 MED ORDER — SODIUM CHLORIDE 0.9% FLUSH
10.0000 mL | Freq: Two times a day (BID) | INTRAVENOUS | Status: DC
  Administered 2018-05-14 – 2018-05-15 (×3): 10 mL

## 2018-05-14 MED ORDER — POLYETHYLENE GLYCOL 3350 17 G PO PACK
17.0000 g | PACK | Freq: Every day | ORAL | Status: DC
  Administered 2018-05-14 – 2018-05-17 (×2): 17 g via ORAL
  Filled 2018-05-14 (×2): qty 1

## 2018-05-14 MED ORDER — FUROSEMIDE 40 MG PO TABS
40.0000 mg | ORAL_TABLET | Freq: Every day | ORAL | Status: DC
  Administered 2018-05-15 – 2018-05-17 (×3): 40 mg via ORAL
  Filled 2018-05-14 (×3): qty 1

## 2018-05-14 MED ORDER — ENOXAPARIN SODIUM 40 MG/0.4ML ~~LOC~~ SOLN
40.0000 mg | SUBCUTANEOUS | Status: DC
  Administered 2018-05-14 – 2018-05-17 (×4): 40 mg via SUBCUTANEOUS
  Filled 2018-05-14 (×4): qty 0.4

## 2018-05-14 MED ORDER — FUROSEMIDE 10 MG/ML IJ SOLN
40.0000 mg | Freq: Every day | INTRAMUSCULAR | Status: AC
  Administered 2018-05-14: 40 mg via INTRAVENOUS
  Filled 2018-05-14: qty 4

## 2018-05-14 MED ORDER — AMIODARONE HCL 200 MG PO TABS
400.0000 mg | ORAL_TABLET | Freq: Two times a day (BID) | ORAL | Status: DC
  Administered 2018-05-14 – 2018-05-17 (×7): 400 mg via ORAL
  Filled 2018-05-14 (×7): qty 2

## 2018-05-14 MED ORDER — SODIUM CHLORIDE 0.9% FLUSH: 10.0000 mL | INTRAVENOUS | Status: DC | PRN

## 2018-05-14 NOTE — Progress Notes (Signed)
      [address]       [place] [address]             [phone]      3 Days Post-Op Procedure(s) (LRB): CORONARY ARTERY BYPASS GRAFTING (CABG), on pump, times four, using left internal mammary artery and endoscopically harvested right greater saphenous leg vein. (N/A) TRANSESOPHAGEAL ECHOCARDIOGRAM (TEE) (N/A) Subjective: He feels okay this morning. His pain is well controlled.   Objective: Vital signs in last 24 hours: Temp:  [97.7 F (36.5 C)-98.6 F (37 C)] 97.9 F (36.6 C) (06/14 0751) Pulse Rate:  [72-110] 73 (06/14 0751) Cardiac Rhythm: Normal sinus rhythm (06/14 0700) Resp:  [15-28] 16 (06/14 0751) BP: (109-152)/(62-98) 134/79 (06/14 0751) SpO2:  [94 %-100 %] 95 % (06/14 0751) Weight:  [113.2 kg (249 lb 8 oz)] 113.2 kg (249 lb 8 oz) (06/14 0515)     Intake/Output from previous day: 06/13 0701 - 06/14 0700 In: 1117.1 [P.O.:830; I.V.:187.1; IV Piggyback:100] Out: 700 [Urine:700] Intake/Output this shift: No intake/output data recorded.  General appearance: alert, cooperative and no distress Heart: regular rate and rhythm, S1, S2 normal, no murmur, click, rub or gallop Lungs: clear to auscultation bilaterally Abdomen: soft, non-tender; bowel sounds normal; no masses,  no organomegaly Extremities: upper extremity edema Wound: clean and dry. Small amount of drainage on the dressing  Lab Results: Recent Labs    05/13/18 0426 05/14/18 0339  WBC 16.6* 14.7*  HGB 11.5* 11.0*  HCT 34.9* 33.2*  PLT 166 160   BMET:  Recent Labs    05/13/18 0426 05/14/18 0339  NA 136 133*  K 3.9 3.9  CL 102 101  CO2 24 25  GLUCOSE 152* 149*  BUN 13 14  CREATININE 1.13 1.09  CALCIUM 8.3* 8.0*    PT/INR:  Recent Labs    05/11/18 1856  LABPROT 16.2*  INR 1.31   ABG    Component Value Date/Time   PHART 7.376 05/12/2018 0117   HCO3 23.1 05/12/2018 0117   TCO2 21 (L) 05/12/2018 1639   ACIDBASEDEF 2.0 05/12/2018 0117   O2SAT 98.0 05/12/2018 0117    CBG (last 3)  Recent Labs    05/13/18 2117 05/14/18 0328 05/14/18 0627  GLUCAP 179* 144* 161*    Assessment/Plan: S/P Procedure(s) (LRB): CORONARY ARTERY BYPASS GRAFTING (CABG), on pump, times four, using left internal mammary artery and endoscopically harvested right greater saphenous leg vein. (N/A) TRANSESOPHAGEAL ECHOCARDIOGRAM (TEE) (N/A)  1. CV-Afib last night, now NSR in the 80s. IV Amio protocol. BP a little better this morning. Continue Clonidine, hydralazine, and metoprolol. PRN labetalol.  2. Pulm-CXR showed no pneumothorax. Small bilateral pleural effusions 3. Renal-creatinine 1.09, electrolytes are okay. Weight up 2 lbs today. Good urine output. IV lasix 40mg  today then switch to oral. 4. H and H-11.0/33.2 5. Platelets 160k 6. Endo-blood glucose well controlled on the current regimen.   Plan: Continue diuretics for fluid overload. Continue Amio protocol and change to oral when appropriate. Ambulate TID. Encouraged incentive spirometer. Likely home Sunday if he continued to progress.    LOS: 12 days    [name] 05/14/2018

## 2018-05-14 NOTE — Care Management Important Message (Signed)
Important Message  Patient Details  Name: Alejandro [name] MRN: [id number] Date of Birth: 04-21-1941   Medicare Important Message Given:  Yes    Barb Merino [name] 05/14/2018, 3:43 PM

## 2018-05-14 NOTE — Progress Notes (Signed)
CARDIAC REHAB PHASE I   PRE:  Rate/Rhythm: 81 SR    BP: sitting 136/67    SaO2: 97 RA  MODE:  Ambulation: 530 ft   POST:  Rate/Rhythm: 97 SR    BP: sitting 161/81     SaO2: 96 RA  Pt moving well. Able to stand and walk independently with supervision. Had one episode of SOB walking, rested and felt better. VSS, increased distance. He can walk independently once condom catheter off. Ed completed with pt, good reception. Will refer to [place]. D/c video not working right now, left instructions for later.  [place], ACSM 05/14/2018 9:44 AM

## 2018-05-14 NOTE — Progress Notes (Signed)
Peripherally Inserted Central Catheter/Midline Placement  The IV Nurse has discussed with the patient and/or persons authorized to consent for the patient, the purpose of this procedure and the potential benefits and risks involved with this procedure.  The benefits include less needle sticks, lab draws from the catheter, and the patient may be discharged home with the catheter. Risks include, but not limited to, infection, bleeding, blood clot (thrombus formation), and puncture of an artery; nerve damage and irregular heartbeat and possibility to perform a PICC exchange if needed/ordered by physician.  Alternatives to this procedure were also discussed.  Bard Power PICC patient education guide, fact sheet on infection prevention and patient information card has been provided to patient /or left at bedside.    PICC/Midline Placement Documentation  PICC Double Lumen 09/81/19 PICC Right Basilic 45 cm 0 cm (Active)  Indication for Insertion or Continuance of Line Prolonged intravenous therapies 05/14/2018 12:50 PM  Exposed Catheter (cm) 0 cm 05/14/2018 12:50 PM  Site Assessment Clean;Dry;Intact 05/14/2018 12:50 PM  Lumen #1 Status Flushed;Saline locked;Blood return noted 05/14/2018 12:50 PM  Lumen #2 Status Flushed;Saline locked;Blood return noted 05/14/2018 12:50 PM  Dressing Type Transparent;Securing device 05/14/2018 12:50 PM  Dressing Status Clean;Dry;Intact;Antimicrobial disc in place 05/14/2018 12:50 PM  Dressing Change Due 05/21/18 05/14/2018 12:50 PM       Alejandro [name] 05/14/2018, 1:03 PM

## 2018-05-15 LAB — BASIC METABOLIC PANEL
Anion gap: 8 (ref 5–15)
BUN: 18 mg/dL (ref 6–20)
CO2: 25 mmol/L (ref 22–32)
Calcium: 8.4 mg/dL — ABNORMAL LOW (ref 8.9–10.3)
Chloride: 103 mmol/L (ref 101–111)
Creatinine, Ser: 0.99 mg/dL (ref 0.61–1.24)
GFR calc Af Amer: >60 mL/min (ref >60)
GFR calc non Af Amer: >60 mL/min (ref >60)
Glucose, Bld: 143 mg/dL — ABNORMAL HIGH (ref 65–99)
Potassium: 3.9 mmol/L (ref 3.5–5.1)
Sodium: 136 mmol/L (ref 135–145)

## 2018-05-15 LAB — CBC
HCT: 33.5 % — ABNORMAL LOW (ref 39.0–52.0)
Hemoglobin: 11.1 g/dL — ABNORMAL LOW (ref 13.0–17.0)
MCH: 29.7 pg (ref 26.0–34.0)
MCHC: 33.1 g/dL (ref 30.0–36.0)
MCV: 89.6 fL (ref 78.0–100.0)
Platelets: 177 10*3/uL (ref 150–400)
RBC: 3.74 MIL/uL — ABNORMAL LOW (ref 4.22–5.81)
RDW: 15.5 % (ref 11.5–15.5)
WBC: 13.2 10*3/uL — ABNORMAL HIGH (ref 4.0–10.5)

## 2018-05-15 LAB — GLUCOSE, CAPILLARY
Glucose-Capillary: 127 mg/dL — ABNORMAL HIGH (ref 65–99)
Glucose-Capillary: 144 mg/dL — ABNORMAL HIGH (ref 65–99)
Glucose-Capillary: 96 mg/dL (ref 65–99)
Glucose-Capillary: 107 mg/dL — ABNORMAL HIGH (ref 65–99)

## 2018-05-15 MED ORDER — METOPROLOL TARTRATE 50 MG PO TABS
75.0000 mg | ORAL_TABLET | Freq: Two times a day (BID) | ORAL | Status: DC
  Administered 2018-05-15 – 2018-05-16 (×3): 75 mg via ORAL
  Filled 2018-05-15 (×3): qty 1

## 2018-05-15 MED ORDER — BENZONATATE 100 MG PO CAPS
100.0000 mg | ORAL_CAPSULE | Freq: Three times a day (TID) | ORAL | Status: DC | PRN
Start: 2018-05-15 — End: 2018-05-17
  Administered 2018-05-15 (×3): 100 mg via ORAL
  Filled 2018-05-15 (×4): qty 1

## 2018-05-15 NOTE — Progress Notes (Signed)
Pt reported waking up from a bad dream with "a room filed with water", he also reported having a bad cough with flame. Pt stated being scared to lye down on bed because of the "bad cough". MD notified new order received.

## 2018-05-15 NOTE — Progress Notes (Addendum)
      [address]       [place] [address]             [phone]      4 Days Post-Op Procedure(s) (LRB): CORONARY ARTERY BYPASS GRAFTING (CABG), on pump, times four, using left internal mammary artery and endoscopically harvested right greater saphenous leg vein. (N/A) TRANSESOPHAGEAL ECHOCARDIOGRAM (TEE) (N/A) Subjective: Doing well today.   Objective: Vital signs in last 24 hours: Temp:  [97.9 F (36.6 C)-98.4 F (36.9 C)] 98.1 F (36.7 C) (06/15 0916) Pulse Rate:  [68-94] 71 (06/15 0916) Cardiac Rhythm: Normal sinus rhythm (06/15 0834) Resp:  [17-23] 19 (06/15 0916) BP: (119-153)/(69-93) 130/69 (06/15 0916) SpO2:  [92 %-100 %] 96 % (06/15 0916) Weight:  [111.1 kg (245 lb)] 111.1 kg (245 lb) (06/15 0428)     Intake/Output from previous day: 06/14 0701 - 06/15 0700 In: 662 [P.O.:720; I.V.:23] Out: 700 [Urine:700] Intake/Output this shift: Total I/O In: 240 [P.O.:240] Out: -   General appearance: alert, cooperative and no distress Heart: regular rate and rhythm, S1, S2 normal, no murmur, click, rub or gallop Lungs: clear to auscultation bilaterally Abdomen: soft, non-tender; bowel sounds normal; no masses,  no organomegaly Extremities: extremities normal, atraumatic, no cyanosis or edema Wound: clean and dry  Lab Results: Recent Labs    05/14/18 0339 05/15/18 0309  WBC 14.7* 13.2*  HGB 11.0* 11.1*  HCT 33.2* 33.5*  PLT 160 177   BMET:  Recent Labs    05/14/18 0339 05/15/18 0309  NA 133* 136  K 3.9 3.9  CL 101 103  CO2 25 25  GLUCOSE 149* 143*  BUN 14 18  CREATININE 1.09 0.99  CALCIUM 8.0* 8.4*    PT/INR: No results for input(s): LABPROT, INR in the last 72 hours. ABG    Component Value Date/Time   PHART 7.376 05/12/2018 0117   HCO3 23.1 05/12/2018 0117   TCO2 21 (L) 05/12/2018 1639   ACIDBASEDEF 2.0 05/12/2018 0117   O2SAT 98.0 05/12/2018 0117   CBG (last 3)  Recent Labs    05/14/18 1619 05/14/18 2223  05/15/18 0643  GLUCAP 136* 134* 127*    Assessment/Plan: S/P Procedure(s) (LRB): CORONARY ARTERY BYPASS GRAFTING (CABG), on pump, times four, using left internal mammary artery and endoscopically harvested right greater saphenous leg vein. (N/A) TRANSESOPHAGEAL ECHOCARDIOGRAM (TEE) (N/A)   1. CV-Post-op afib, now NSR in the 70s however went into the 130s ST while I was in the room. PO Amio. BP a little better this morning. Continue Clonidine, hydralazine, and metoprolol. PRN labetalol.  2. Pulm-CXR showed no pneumothorax. Small bilateral pleural effusions. No new image to review.  3. Renal-creatinine 0.99, electrolytes are okay.Weight is down today. Good urine output. Continue oral lasix.  4. H and H-11.1/33.5 5. Platelets 177k 6. Endo-blood glucose well controlled on the current regimen.   Plan: Continues to have short bursts of afib. Continue PO Amio. Increase Lopressor. Will hold off on EPW removal until tomorrow.    LOS: 13 days    [name] 05/15/2018 Patient seen and examined, agree with above   Remo Lipps C. Roxan Hockey, MD [facility] [phone]

## 2018-05-15 NOTE — Progress Notes (Signed)
CARDIAC REHAB PHASE I   PRE:  Rate/Rhythm: 69  BP:  Sitting: 128/81     SaO2:   MODE:  Ambulation: 440 ft   POST:  Rate/Rhythm: 86  BP:  Sitting: 153/68     SaO2:  10:00a-10:20a Patient ambulated independently with no rest breaks. Mild SOB. This was his second walk of the day. Patient back in chair.  [place], MS 05/15/2018 10:17 AM

## 2018-05-16 LAB — GLUCOSE, CAPILLARY
Glucose-Capillary: 115 mg/dL — ABNORMAL HIGH (ref 65–99)
Glucose-Capillary: 120 mg/dL — ABNORMAL HIGH (ref 65–99)
Glucose-Capillary: 128 mg/dL — ABNORMAL HIGH (ref 65–99)
Glucose-Capillary: 83 mg/dL (ref 65–99)
Glucose-Capillary: 90 mg/dL (ref 65–99)

## 2018-05-16 NOTE — Progress Notes (Addendum)
      [address]       [place] [address]             [phone]      5 Days Post-Op Procedure(s) (LRB): CORONARY ARTERY BYPASS GRAFTING (CABG), on pump, times four, using left internal mammary artery and endoscopically harvested right greater saphenous leg vein. (N/A) TRANSESOPHAGEAL ECHOCARDIOGRAM (TEE) (N/A) Subjective: Feels okay this morning. Wants to go home. Did 4 walks in the halls yesterday.  Objective: Vital signs in last 24 hours: Temp:  [97.6 F (36.4 C)-98.2 F (36.8 C)] 98.2 F (36.8 C) (06/16 0516) Pulse Rate:  [60-73] 60 (06/16 0516) Cardiac Rhythm: Normal sinus rhythm (06/15 2000) Resp:  [19-24] 20 (06/16 0516) BP: (124-141)/(67-94) 127/67 (06/16 0516) SpO2:  [96 %] 96 % (06/16 0516) Weight:  [111.4 kg (245 lb 8 oz)] 111.4 kg (245 lb 8 oz) (06/16 0516)    Intake/Output from previous day: 06/15 0701 - 06/16 0700 In: 400 [P.O.:400] Out: -  Intake/Output this shift: No intake/output data recorded.  General appearance: alert, cooperative and no distress Heart: regular rate and rhythm, S1, S2 normal, no murmur, click, rub or gallop Lungs: clear to auscultation bilaterally Abdomen: soft, non-tender; bowel sounds normal; no masses,  no organomegaly Extremities: 1+ non pitting pedal edema R > L Wound: clean and dry  Lab Results: Recent Labs    05/14/18 0339 05/15/18 0309  WBC 14.7* 13.2*  HGB 11.0* 11.1*  HCT 33.2* 33.5*  PLT 160 177   BMET:  Recent Labs    05/14/18 0339 05/15/18 0309  NA 133* 136  K 3.9 3.9  CL 101 103  CO2 25 25  GLUCOSE 149* 143*  BUN 14 18  CREATININE 1.09 0.99  CALCIUM 8.0* 8.4*    PT/INR: No results for input(s): LABPROT, INR in the last 72 hours. ABG    Component Value Date/Time   PHART 7.376 05/12/2018 0117   HCO3 23.1 05/12/2018 0117   TCO2 21 (L) 05/12/2018 1639   ACIDBASEDEF 2.0 05/12/2018 0117   O2SAT 98.0 05/12/2018 0117   CBG (last 3)  Recent Labs    05/15/18 2055 05/16/18 0605  05/16/18 0803  GLUCAP 107* 115* 120*    Assessment/Plan: S/P Procedure(s) (LRB): CORONARY ARTERY BYPASS GRAFTING (CABG), on pump, times four, using left internal mammary artery and endoscopically harvested right greater saphenous leg vein. (N/A) TRANSESOPHAGEAL ECHOCARDIOGRAM (TEE) (N/A)  1. CV-Post-op afib, now NSR in the 60s however still has short burst of afib on the tele monitor. PO Amio 400mg  BID.BP is stable. Continue Clonidine, hydralazine, and metoprolol. PRN labetalol.  2. Pulm-CXR showed no pneumothorax.Small bilateral pleural effusions. No new image to review.  3. Renal-creatinine 0.99, electrolytes are okay.Weight is down today. Good urine output. Continue oral lasix.  4. H and H-11.1/33.5 5. Platelets 177k 6. Endo-blood glucose well controlled on the current regimen.  Plan: Will discuss removal of epicardial wires today with attending. Still having short episodes of afib.  Otherwise progressing nicely.    LOS: 14 days    [name] 05/16/2018 Patient seen and examined. He is still having brief episodes of AF. Will dc pacing wires. Probably home in AM if no sustained AF May need to go home on [place]. Roxan Hockey, MD [facility] [phone]

## 2018-05-16 NOTE — Progress Notes (Signed)
Pacing wires dc'ed pt tolerated well

## 2018-05-17 ENCOUNTER — Encounter (HOSPITAL_COMMUNITY): Payer: Self-pay

## 2018-05-17 LAB — GLUCOSE, CAPILLARY
Glucose-Capillary: 99 mg/dL (ref 65–99)
Glucose-Capillary: 127 mg/dL — ABNORMAL HIGH (ref 65–99)

## 2018-05-17 MED ORDER — CLONIDINE HCL 0.2 MG PO TABS: 0.2000 mg | ORAL_TABLET | Freq: Two times a day (BID) | ORAL | 1 refills | Status: DC

## 2018-05-17 MED ORDER — ATORVASTATIN CALCIUM 80 MG PO TABS: 80.0000 mg | ORAL_TABLET | Freq: Every day | ORAL | 1 refills | Status: DC

## 2018-05-17 MED ORDER — METOPROLOL TARTRATE 50 MG PO TABS: 50.0000 mg | ORAL_TABLET | Freq: Two times a day (BID) | ORAL | 1 refills | Status: DC

## 2018-05-17 MED ORDER — AMIODARONE HCL 200 MG PO TABS: ORAL_TABLET | ORAL | 0 refills | Status: DC

## 2018-05-17 MED ORDER — METOPROLOL TARTRATE 50 MG PO TABS
50.0000 mg | ORAL_TABLET | Freq: Two times a day (BID) | ORAL | Status: DC
  Administered 2018-05-17: 50 mg via ORAL
  Filled 2018-05-17: qty 1

## 2018-05-17 MED ORDER — OXYCODONE HCL 5 MG PO TABS: 5.0000 mg | ORAL_TABLET | Freq: Four times a day (QID) | ORAL | 0 refills | Status: DC | PRN

## 2018-05-17 MED ORDER — ASPIRIN 325 MG PO TBEC: 325.0000 mg | DELAYED_RELEASE_TABLET | Freq: Every day | ORAL | 0 refills | Status: DC

## 2018-05-17 MED ORDER — HYDRALAZINE HCL 25 MG PO TABS: 25.0000 mg | ORAL_TABLET | Freq: Three times a day (TID) | ORAL | 1 refills | Status: DC

## 2018-05-17 NOTE — Progress Notes (Signed)
CARDIAC REHAB PHASE I   PRE:  Rate/Rhythm: 72 SR  BP:  Supine:   Sitting: 153/76  Standing:    SaO2: 97%RA  MODE:  Ambulation: 800 ft   POST:  Rate/Rhythm: 86 SR  BP:  Supine:   Sitting: 157/67  Standing:    SaO2: 94%RA 0822-0900 Pt walked 800 ft on RA with hand held asst., gait steady. Stopped twice to catch his breath. Reviewed walking instructions with pt . He is concerned about bowels and would like laxative . Had BM yesterday. Will let RN know. No questions re ed done.   Graylon Good, RN BSN  05/17/2018 8:57 AM

## 2018-05-17 NOTE — Progress Notes (Signed)
Pt/family given discharge instructions, medication lists, follow up appointments, and when to call the doctor.  Pt/family verbalizes understanding. Payton Emerald, RN

## 2018-05-17 NOTE — Progress Notes (Addendum)
      [address]       [place], [address]             [phone]      6 Days Post-Op Procedure(s) (LRB): CORONARY ARTERY BYPASS GRAFTING (CABG), on pump, times four, using left internal mammary artery and endoscopically harvested right greater saphenous leg vein. (N/A) TRANSESOPHAGEAL ECHOCARDIOGRAM (TEE) (N/A) Subjective: No issues overnight. Ready to go home today.   Objective: Vital signs in last 24 hours: Temp:  [98 F (36.7 C)-98.2 F (36.8 C)] 98.2 F (36.8 C) (06/17 0502) Pulse Rate:  [61-69] 64 (06/17 0502) Cardiac Rhythm: Normal sinus rhythm (06/16 2000) Resp:  [16-23] 20 (06/17 0502) BP: (128-151)/(71-87) 132/83 (06/17 0502) SpO2:  [94 %-100 %] 100 % (06/17 0502) Weight:  [110.3 kg (243 lb 1.6 oz)] 110.3 kg (243 lb 1.6 oz) (06/17 0502)     Intake/Output from previous day: No intake/output data recorded. Intake/Output this shift: No intake/output data recorded.   General appearance: alert, cooperative and no distress Heart: regular rate and rhythm, S1, S2 normal, no murmur, click, rub or gallop Lungs: clear to auscultation bilaterally Abdomen: soft, non-tender; bowel sounds normal; no masses,  no organomegaly Extremities: extremities normal, atraumatic, no cyanosis or edema Wound: clean and dry  Lab Results: Recent Labs    05/15/18 0309  WBC 13.2*  HGB 11.1*  HCT 33.5*  PLT 177   BMET:  Recent Labs    05/15/18 0309  NA 136  K 3.9  CL 103  CO2 25  GLUCOSE 143*  BUN 18  CREATININE 0.99  CALCIUM 8.4*    PT/INR: No results for input(s): LABPROT, INR in the last 72 hours. ABG    Component Value Date/Time   PHART 7.376 05/12/2018 0117   HCO3 23.1 05/12/2018 0117   TCO2 21 (L) 05/12/2018 1639   ACIDBASEDEF 2.0 05/12/2018 0117   O2SAT 98.0 05/12/2018 0117   CBG (last 3)  Recent Labs    05/16/18 1648 05/16/18 2053 05/17/18 0557  GLUCAP 83 90 99    Assessment/Plan: S/P Procedure(s) (LRB): CORONARY ARTERY BYPASS  GRAFTING (CABG), on pump, times four, using left internal mammary artery and endoscopically harvested right greater saphenous leg vein. (N/A) TRANSESOPHAGEAL ECHOCARDIOGRAM (TEE) (N/A)  1. CV-Post-op afib, now NSR in the 60s. BP well controlled. Continue PO Amio BID, Clonidine, hydralazine, and metoprolol.  2. Pulm-No new CXR. On room air. 3. Renal-creatinine0.99, electrolytes are okay.Weightis down today. Good urine output. Continue oral lasix. 4. H and H-11.1/33.5 5. Platelets 177k 6. Endo-blood glucose well controlled on the current regimen.   Plan: home today. No afib in the last 24 hours. Will not start on anticoagulation.     LOS: 15 days    [name] 05/17/2018

## 2018-05-17 NOTE — Progress Notes (Signed)
Pt given signs and symptoms of infection. Payton Emerald, RN

## 2018-05-17 NOTE — Consult Note (Addendum)
[facility] CM Primary Care Navigator  05/17/2018  Alejandro [name] November 16, 1941 [id number]   Met with patient and grandson Alejandro [name]) at the bedside to identify possible discharge needs. Patient reports having "shortness of breath/ difficulty breathing"  thatresultedto this admission.  Patient endorsesDr.John Nevada Crane with Delphina Cahill, MD office as his primary care provider.   Patient reports using [facility] in [place] obtain medicationswithout any difficulty.   Patient mentioned that he manages his own medications at home with use of "pill box" system filled weekly.  Patient verbalized that he has been driving prior to admission but his grandson (lives nearby) will provide transportation to hisdoctors'appointments when needed after discharge.  Patientstates that he lives alone and physically independent with self care prior to admission. 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 Per patient, grandson will stay with him at home until the weekend. His grandson will be assisting with care needs and serve as his primary caregiver at home. He also has a friend (worked in Development worker, community) who will be coming to his house to help cook for him.  Anticipated plan for discharge is homeaccording to patient.   Patientand grandson expressedunderstanding to call primary care provider's officeafterdischargeto schedule a posthospitalfollow-up visit within 1- 2 weeksor sooner if needs arise.Patient letter(with PCP's contact number) wasgiven as a reminder.  Patienthas known history of Hypertension, diabetes, CKD, IBS, and Lyme Disease. Per MD note, patient was admitted for acute, worsening onset shortness of breath. His CT scan of the chest showed pleural effusions, but no evidence of PE. He was treated with IV lasix in the ED with 1.8L of fluid output. He was ruled in for NSTEMI and  transferred to [facility] for further care. He continued to get diuretics for acute diastolic congestive HF. 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  Patientmentioned havingaweighing scale at home but unsure if still functioning properly. His weight is not checked until he goes to his provider.  Patientadmits not knowing thesigns and symptomsofHFthatwill need medical assistance. Patient is non-compliant with diet restriction. He is unawareof reportable weight gains, HF zones/ tool and ways to manage heart failurebut aware to follow-up with provider when needed. He mentioned eating "hamburgers" for lunch which is the most convenient food he can get to eat at work.  Explained to patient and grandson regarding [facility] care management services/ resources available for himand was interested of it,buthestates preferring home visits than phone calls.He verbally agreed and [facility]coord[facility] after dischargeforfurther assessment of home needs, transition of careand provide ways to manage HF and other health conditions.  Referral made to [facility] managementcoordinator forfollow-up of home needs in managing acute HF, transition of care and provide further health management ofheart failure and chronic health needs at home.  THNcare managementinformation provided for future needs that he may have.   For additional questions please contact:  Edwena Felty A. Leone Putman, BSN, RN-BC [facility] PRIMARY CARE Navigator Cell: [phone]

## 2018-05-19 ENCOUNTER — Other Ambulatory Visit: Payer: Self-pay

## 2018-05-19 DIAGNOSIS — M7989 Other specified soft tissue disorders: Secondary | ICD-10-CM

## 2018-05-19 MED ORDER — POTASSIUM CHLORIDE CRYS ER 10 MEQ PO TBCR: 20.0000 meq | EXTENDED_RELEASE_TABLET | Freq: Every day | ORAL | 0 refills | Status: DC

## 2018-05-19 MED ORDER — FUROSEMIDE 40 MG PO TABS
40.0000 mg | ORAL_TABLET | Freq: Every day | ORAL | 0 refills | Status: DC
Start: 2018-05-19 — End: 2018-06-11

## 2018-05-20 ENCOUNTER — Other Ambulatory Visit: Payer: Self-pay

## 2018-05-20 NOTE — Patient Outreach (Signed)
[facility]) Care Management  05/20/2018  Alejandro [name] [birth year] [id number]   Mr. Treat is a 77 year old male.  [facility] from [facility]. Per chart, member's diagnosis include Non-ST elevated (NSTEMI) myocardial infarction, Hypertension, Diabetes, Chronic kidney disease and Lyme disease.  Mr. Lovelady reported that he felt well and denied complaints of shortness of breath or chest pain since discharge. RN CM discussed [facility] services. Mr. Gary is agreeable to initial home visit and weekly outreach.    PLAN RN CM will follow up next week for initial home visit.    [place] [phone]

## 2018-05-27 ENCOUNTER — Other Ambulatory Visit: Payer: Self-pay

## 2018-05-27 NOTE — Patient Outreach (Signed)
[facility]) Care Management   05/27/2018  CJ EDGELL 06-21-1941 [id number]  DERICK SEMINARA is an 77 y.o. male  Subjective:  Mr. Griep alert and oriented x 3.  Denied complaints of pain. In no acute distress at RNCM'S arrival.  Objective:   BP 120/78 (BP Location: Right Arm, Patient Position: Sitting, Cuff Size: Normal)   Pulse 64   Resp 20   Ht 1.803 m (5\' 11" )   Wt 223 lb (101.2 kg)   SpO2 98%   BMI 31.10 kg/m   Review of Systems  Constitutional: Positive for malaise/fatigue.  HENT: Positive for tinnitus.   Eyes: Negative.   Respiratory: Positive for cough.   Cardiovascular: Positive for leg swelling.       Occasional discomfort to surgical incision. Bilateral edema to lower extremities.  Gastrointestinal: Negative.   Genitourinary: Negative.   Musculoskeletal: Negative.     Physical Exam  Constitutional: He is oriented to person, place, and time. He appears well-developed and well-nourished.  HENT:  Head: Normocephalic.  Cardiovascular: Normal rate.  Respiratory: Effort normal and breath sounds normal.  GI: Soft. Bowel sounds are normal.  Neurological: He is alert and oriented to person, place, and time.  Skin: Skin is warm and dry.  Psychiatric: He has a normal mood and affect. His behavior is normal. Judgment and thought content normal.    Encounter Medications:   Outpatient Encounter Medications as of 05/27/2018  Medication Sig  . ALPRAZolam (XANAX) 0.5 MG tablet Take 0.5 mg by mouth at bedtime as needed for sleep.  Marland Kitchen amiodarone (PACERONE) 200 MG tablet Please take 2 tabs (400mg ) twice a day for three days then 1 tab (200mg ) twice a day until we see you in the office.  Marland Kitchen aspirin 325 MG EC tablet Take 1 tablet (325 mg total) by mouth daily.  Marland Kitchen atorvastatin (LIPITOR) 80 MG tablet Take 1 tablet (80 mg total) by mouth daily at 6 PM.  . cloNIDine (CATAPRES) 0.2 MG tablet Take 1 tablet (0.2 mg total) by mouth 2 (two) times daily.  Marland Kitchen DEXILANT  60 MG capsule TAKE ONE CAPSULE BY MOUTH EVERY DAY.  . furosemide (LASIX) 40 MG tablet Take 1 tablet (40 mg total) by mouth daily.  . hydrALAZINE (APRESOLINE) 25 MG tablet Take 1 tablet (25 mg total) by mouth every 8 (eight) hours.  . metoprolol tartrate (LOPRESSOR) 50 MG tablet Take 1 tablet (50 mg total) by mouth 2 (two) times daily.  Marland Kitchen oxyCODONE (OXY IR/ROXICODONE) 5 MG immediate release tablet Take 1 tablet (5 mg total) by mouth every 6 (six) hours as needed for severe pain.  . potassium chloride (K-DUR,KLOR-CON) 10 MEQ tablet Take 2 tablets (20 mEq total) by mouth daily for 10 days.  . sitaGLIPtan-metformin (JANUMET) 50-1000 MG per tablet Take 1 tablet by mouth 2 (two) times daily with a meal.     No facility-administered encounter medications on file as of 05/27/2018.     Functional Status:   In your present state of health, do you have any difficulty performing the following activities: 05/02/2018 05/02/2018  Hearing? - N  Vision? - N  Difficulty concentrating or making decisions? - N  Walking or climbing stairs? - N  Dressing or bathing? - N  Doing errands, shopping? Y -  Some recent data might be hidden     Assesessment:   Initial home visit complete. Mr. Creech reported "feeling very good" and doing well since hospital discharge. No complaints of shortness of breath or  chest pain. Reported occasional discomfort to surgical incision but otherwise doing well. Ambulating well and able to perform ADLs. Reported compliance with medications. Uses pill box to prepare meds. Denied concerns with medication management or affordability. He is not receiving home health but does not feel that services are required.  Denied need for transportation or assistance in the home. Reported that grandson Erlene Quan is available to assist as needed.   Discussed plan of care and [facility] services. Mr. Kroening was agreeable to ongoing outreach. Denied urgent concerns and agreed to contact RNCM with questions as  needed.  THN CM Care Plan Problem One     Most Recent Value  Care Plan Problem One  Risk for readmission related to heart failure complications.  Role Documenting the Problem One  Care Management Coordinator  Care Plan for Problem One  Active  THN Long Term Goal   Over the next 60 days patient will not have a hospitalization related to heart failure.  THN Long Term Goal Start Date  05/27/18  Interventions for Problem One Long Term Goal  Provided education regarding cardiac complications. Discussed s/sx that require MD follow up. Reviewed medications, weights and nutrition.  THN CM Short Term Goal #1   Over the next 30 days patient will attend all scheduled follow up appointments.  THN CM Short Term Goal #1 Start Date  05/27/18  Interventions for Short Term Goal #1  Reviewed pending MD follow ups and transportation needs. [reported grandson Erlene Quan is available if needed.]  THN CM Short Term Goal #2   Over the next 30 days patient will take medications as prescribed.  THN CM Short Term Goal #2 Start Date  05/27/18  Interventions for Short Term Goal #2  Reviewed medications and indications for use. Discussed medication management and affordability.      PLAN Will follow up on next week.   [place] [phone]

## 2018-05-28 ENCOUNTER — Encounter (HOSPITAL_COMMUNITY): Payer: Self-pay | Admitting: Cardiology

## 2018-05-28 DIAGNOSIS — K5900 Constipation, unspecified: Secondary | ICD-10-CM | POA: Diagnosis not present

## 2018-05-28 DIAGNOSIS — I2510 Atherosclerotic heart disease of native coronary artery without angina pectoris: Secondary | ICD-10-CM | POA: Diagnosis not present

## 2018-05-28 DIAGNOSIS — I10 Essential (primary) hypertension: Secondary | ICD-10-CM | POA: Diagnosis not present

## 2018-05-28 DIAGNOSIS — R600 Localized edema: Secondary | ICD-10-CM | POA: Diagnosis not present

## 2018-05-28 DIAGNOSIS — Z6832 Body mass index (BMI) 32.0-32.9, adult: Secondary | ICD-10-CM | POA: Diagnosis not present

## 2018-05-28 DIAGNOSIS — I480 Paroxysmal atrial fibrillation: Secondary | ICD-10-CM | POA: Diagnosis not present

## 2018-05-28 DIAGNOSIS — E782 Mixed hyperlipidemia: Secondary | ICD-10-CM | POA: Diagnosis not present

## 2018-05-28 DIAGNOSIS — E1169 Type 2 diabetes mellitus with other specified complication: Secondary | ICD-10-CM | POA: Diagnosis not present

## 2018-06-01 ENCOUNTER — Other Ambulatory Visit: Payer: Self-pay

## 2018-06-01 ENCOUNTER — Ambulatory Visit: Payer: Self-pay

## 2018-06-01 NOTE — Patient Outreach (Signed)
[facility] [name]) Care Management  06/01/2018  Alejandro [name] 05-10-1941 [id number]   Outreach complete. HIPAA verifiers obtained. Mr. Dejaynes reported doing well. No changes since home visit. Tolerates short walks around his home without fatigue or shortness of breath. No complaints of chest pain. Reported difficulty sleeping but felt it was due to the bed being uncomfortable. He is currently utilizing a rented hospital bed. No urgent concerns. Will follow up on next week.  [place] [phone]

## 2018-06-10 ENCOUNTER — Other Ambulatory Visit: Payer: Self-pay

## 2018-06-10 NOTE — Patient Outreach (Signed)
[facility]) Care Management   06/10/2018  Alejandro [name] 1941-09-21 [id number]  Alejandro [name] is an 77 y.o. male  Subjective:  Alejandro [name] alert and oriented x 3. No complaints of pain. Ambulating outdoors at RNCM's arrival.  Objective:   BP 118/68 (BP Location: Left Arm, Patient Position: Sitting, Cuff Size: Normal)   Pulse 64 Comment: Apical  Ht 1.803 m (5\' 11" )   Wt 229 lb (103.9 kg)   SpO2 97%   BMI 31.94 kg/m   Review of Systems  Constitutional: Negative.   HENT: Negative.   Eyes: Negative.   Respiratory: Negative.   Cardiovascular: Negative.   Gastrointestinal: Negative for abdominal pain, blood in stool, constipation, diarrhea, heartburn, nausea and vomiting.  Musculoskeletal: Negative.   Skin: Negative.   Neurological: Negative.   Psychiatric/Behavioral: Negative.     Physical Exam  Constitutional: He is oriented to person, place, and time. He appears well-developed and well-nourished.  Cardiovascular: Normal rate.  Heart Rate 64   Respiratory: Effort normal and breath sounds normal.  GI: Soft. Bowel sounds are normal.  Neurological: He is alert and oriented to person, place, and time.  Skin: Skin is warm and dry.  Psychiatric: He has a normal mood and affect. His behavior is normal. Judgment and thought content normal.    Encounter Medications:   Outpatient Encounter Medications as of 06/10/2018  Medication Sig  . ALPRAZolam (XANAX) 0.5 MG tablet Take 0.5 mg by mouth at bedtime as needed for sleep.  Marland Kitchen amiodarone (PACERONE) 200 MG tablet Please take 2 tabs (400mg ) twice a day for three days then 1 tab (200mg ) twice a day until we see you in the office.  Marland Kitchen aspirin 325 MG EC tablet Take 1 tablet (325 mg total) by mouth daily.  Marland Kitchen atorvastatin (LIPITOR) 80 MG tablet Take 1 tablet (80 mg total) by mouth daily at 6 PM.  . cloNIDine (CATAPRES) 0.2 MG tablet Take 1 tablet (0.2 mg total) by mouth 2 (two) times daily.  Marland Kitchen DEXILANT 60 MG capsule TAKE  ONE CAPSULE BY MOUTH EVERY DAY.  . furosemide (LASIX) 40 MG tablet Take 1 tablet (40 mg total) by mouth daily.  . hydrALAZINE (APRESOLINE) 25 MG tablet Take 1 tablet (25 mg total) by mouth every 8 (eight) hours.  . metoprolol tartrate (LOPRESSOR) 50 MG tablet Take 1 tablet (50 mg total) by mouth 2 (two) times daily.  Marland Kitchen oxyCODONE (OXY IR/ROXICODONE) 5 MG immediate release tablet Take 1 tablet (5 mg total) by mouth every 6 (six) hours as needed for severe pain.  . potassium chloride (K-DUR,KLOR-CON) 10 MEQ tablet Take 2 tablets (20 mEq total) by mouth daily for 10 days.  . sitaGLIPtan-metformin (JANUMET) 50-1000 MG per tablet Take 1 tablet by mouth 2 (two) times daily with a meal.     No facility-administered encounter medications on file as of 06/10/2018.     Functional Status:   In your present state of health, do you have any difficulty performing the following activities: 05/02/2018 05/02/2018  Hearing? - N  Vision? - N  Difficulty concentrating or making decisions? - N  Walking or climbing stairs? - N  Dressing or bathing? - N  Doing errands, shopping? Y -  Some recent data might be hidden    Fall/Depression Screening:    Fall Risk  05/27/2018  Falls in the past year? No   PHQ 2/9 Scores 05/27/2018  PHQ - 2 Score 1    Assessment:   Home visit complete.  Alejandro [name] remains compliant with medications and daily glucose monitoring. Reports attending MD appointments as scheduled. Will follow up with PCP and obtain medication refills this week. Ambulates well without use of assistive device. Tolerating 20-30-minute walks without shortness of breath or chest discomfort. He continues to progress well since discharge and reported no changes regarding care management needs.   THN CM Care Plan Problem One     Most Recent Value  Care Plan Problem One  Risk for readmission related to heart failure complications.  Role Documenting the Problem One  Care Management Coordinator  Care Plan for Problem  One  Active  THN Long Term Goal   Over the next 60 days patient will not have a hospitalization related to heart failure.  THN Long Term Goal Start Date  05/27/18  Interventions for Problem One Long Term Goal  Reviewed medications, nutrition and safety precautions. Reviewed worsening s/sx that require follow up. [Med refills needed.]  THN CM Short Term Goal #1   Over the next 30 days patient will attend all scheduled follow up appointments.  THN CM Short Term Goal #1 Start Date  05/27/18  Interventions for Short Term Goal #1  Reviewed pending appointments. Scheduled follow up with PCP.  THN CM Short Term Goal #2   Over the next 30 days patient will take medications as prescribed.  THN CM Short Term Goal #2 Start Date  05/27/18  Interventions for Short Term Goal #2  Discussed medication compliance. Medications to be refilled during PCP follow up. [Refills needed.]      PLAN Will continue routine outreach.    [place] [phone]

## 2018-06-10 NOTE — Progress Notes (Signed)
Cardiology Office Note    Date:  06/11/2018  ID:  Alejandro [name], DOB 1941-01-21, MRN [id number] PCP:  Celene Squibb, MD  Cardiologist:  Rozann Lesches, MD  Chief Complaint: f/u CABG  History of Present Illness:  Alejandro [name] is a 77 y.o. male with history of hypertension, diabetes, CKD stage III, IBS, Lyme Disease, and recently diagnosed CAD s/p CABG with diastolic CHF, mild-moderate AI who presents for post-hospital follow-up.  He recently was admitted 05/2018 with SOB. He thought it was due to recently diagnosed Lyme disease for which he was started on doxycycline. He was seen at urgent care earlier the day of admission and given a prescription for Zithromax for worsening SOB. This had begun 3 days prior to admission. Due to worsening symptoms, he was brought to the [facility] emergency department where workup showed pleural effusions and pulmonary edema with elevated troponin and BNP. D-dimer was elevated - chest CTA was negative for pulmonary embolism but did show coronary calcifications. He ruled in for NSTEMI with troponin peak 1.41. Cr was elevated suspected due to CKD but no prior to compare to. 2D echo 05/03/18 showed mild LVH, EF 50-55%, mild hypokinesis of thebasal-midinferior myocardium, grade 2 diastolic dysfunction, trileaflet AV with mild aortic stenosis (referenced as moderate in echo 05/2018), mild mitral regurgitation, moderately dilated left atrium, moderately increased PA systolic pressure. Cardiac cath 05/04/18 showed multivessel CAD. LVEDP was 63mmHg prompting continued diuresis with Lasix. Pre-op carotids showed 1-39% bilateral stenosis, resting R/L ABIs wnl. He underwent CABGx4 on 05/11/18 (left internal mammary artery to left anterior descending, saphenous vein graft to diagonal, saphenous vein graft to the obtuse marginal, saphenous vein graft to posterior descending). Intra-op TEE showed no aortic stenosis, but mild-moderate aortic insufficiency. Post-procedure course notable for  fluid overload with standard diuresis, frequent PVCs, and post-op afib requiring amiodarone initiation. He was not discharged on anticoagulation. Other pertinent labs this admission include A1C 6.0, normal LFTs, no recent lipids or TSH on file.  He returns for follow-up today with his grandson Alejandro [name]. In general he's been doing great since discharge. All in all since the initial presentation with volume overload he's down 36lb. They've noticed his abdomen does not protrude like it used to. He denies any orthopnea, PND, SOB, or lower extremity edema. He does note that when he saw primary care NP this AM, she poked at his right leg endoscopic vein harvest site and pus came out. There is mild circumferential erythema as well. They wonder if perhaps he had scratched it during sleep.  Past Medical History:  Diagnosis Date  . Aortic insufficiency    a. mild-mod by TEE 05/2018 (no aortic stenosis).  Marland Kitchen CAD (coronary artery disease)    a. NSTEMI 05/2018 with multivessel disease -> s/p CABGx4 05/11/18.  Marland Kitchen Chronic diastolic CHF (congestive heart failure) ([place])    a. dx 05/2018.  . CKD (chronic kidney disease), stage III ([place])   . History of nephrolithiasis 2004  . Hyperlipidemia   . Hypertension   . IBS (irritable colon syndrome)   . Postoperative atrial fibrillation ([place])   . PVC's (premature ventricular contractions)   . Reflux   . Type 2 diabetes mellitus ([place])     Past Surgical History:  Procedure Laterality Date  . CORONARY ARTERY BYPASS GRAFT N/A 05/11/2018   Procedure: CORONARY ARTERY BYPASS GRAFTING (CABG), on pump, times four, using left internal mammary artery and endoscopically harvested right greater saphenous leg vein.;  Surgeon: Ivin Poot, MD;  [facility];  Service: Open Heart Surgery;  Laterality: N/A;  . ESOPHAGOGASTRODUODENOSCOPY  02/04/2012   Procedure: ESOPHAGOGASTRODUODENOSCOPY (EGD);  Surgeon: Rogene Houston, MD;  [facility];  Service: Endoscopy;  Laterality:  N/A;  100  . LEFT HEART CATH AND CORONARY ANGIOGRAPHY N/A 05/04/2018   Procedure: LEFT HEART CATH AND CORONARY ANGIOGRAPHY;  Surgeon: Belva Crome, MD;  Location: [place] CV LAB;  Service: Cardiovascular;  Laterality: N/A;  . NASAL FRACTURE SURGERY    . TEE WITHOUT CARDIOVERSION N/A 05/11/2018   Procedure: TRANSESOPHAGEAL ECHOCARDIOGRAM (TEE);  Surgeon: Prescott Gum, Collier Salina, MD;  Location: [place];  Service: Open Heart Surgery;  Laterality: N/A;    Current Medications: Current Meds  Medication Sig  . ALPRAZolam (XANAX) 0.5 MG tablet Take 0.5 mg by mouth at bedtime as needed for sleep.  Marland Kitchen amiodarone (PACERONE) 200 MG tablet Please take 2 tabs (400mg ) twice a day for three days then 1 tab (200mg ) twice a day until we see you in the office.  Marland Kitchen aspirin 325 MG EC tablet Take 1 tablet (325 mg total) by mouth daily.  Marland Kitchen atorvastatin (LIPITOR) 80 MG tablet Take 1 tablet (80 mg total) by mouth daily at 6 PM.  . bisacodyl (DULCOLAX) 5 MG EC tablet Take 5 mg by mouth daily as needed for moderate constipation.  . cloNIDine (CATAPRES) 0.2 MG tablet Take 1 tablet (0.2 mg total) by mouth 2 (two) times daily.  Marland Kitchen DEXILANT 60 MG capsule TAKE ONE CAPSULE BY MOUTH EVERY DAY.  . furosemide (LASIX) 40 MG tablet Take 1 tablet (40 mg total) by mouth daily.  . hydrALAZINE (APRESOLINE) 25 MG tablet Take 1 tablet (25 mg total) by mouth every 8 (eight) hours.  . metoprolol tartrate (LOPRESSOR) 50 MG tablet Take 1 tablet (50 mg total) by mouth 2 (two) times daily.  Marland Kitchen oxyCODONE (OXY IR/ROXICODONE) 5 MG immediate release tablet Take 1 tablet (5 mg total) by mouth every 6 (six) hours as needed for severe pain.  . potassium chloride (K-DUR,KLOR-CON) 10 MEQ tablet Take 2 tablets (20 mEq total) by mouth daily for 10 days.  . sitaGLIPtan-metformin (JANUMET) 50-1000 MG per tablet Take 1 tablet by mouth 2 (two) times daily with a meal.     Allergies:   Patient has no known allergies.   Social History   Socioeconomic History  .  Marital status: Married    Spouse name: Not on file  . Number of children: Not on file  . Years of education: Not on file  . Highest education level: Not on file  Occupational History  . Not on file  Social Needs  . Financial resource strain: Not on file  . Food insecurity:    Worry: Not on file    Inability: Not on file  . Transportation needs:    Medical: Not on file    Non-medical: Not on file  Tobacco Use  . Smoking status: Never Smoker  . Smokeless tobacco: Never Used  Substance and Sexual Activity  . Alcohol use: No    Alcohol/week: 0.0 oz  . Drug use: No  . Sexual activity: Yes  Lifestyle  . Physical activity:    Days per week: Not on file    Minutes per session: Not on file  . Stress: Not on file  Relationships  . Social connections:    Talks on phone: Not on file    Gets together: Not on file    Attends religious service: Not on file  Active member of club or organization: Not on file    Attends meetings of clubs or organizations: Not on file    Relationship status: Not on file  Other Topics Concern  . Not on file  Social History Narrative  . Not on file     Family History:  The patient's family history includes Arthritis in his unknown relative; Asthma in his unknown relative; Diabetes in his unknown relative; Diabetes Mellitus II in his mother; Heart attack in his brother and father; Heart disease in his unknown relative.  ROS:   Please see the history of present illness.   All other systems are reviewed and otherwise negative.    PHYSICAL EXAM:   VS:  BP 126/76 (BP Location: Left Arm)   Pulse 68   Ht 5\' 11"  (1.803 m)   Wt 229 lb (103.9 kg)   SpO2 94%   BMI 31.94 kg/m   BMI: Body mass index is 31.94 kg/m. GEN: Well nourished, well developed obese WM, in no acute distress. Good color complexion  HEENT: normocephalic, atraumatic Neck: no JVD, carotid bruits, or masses Cardiac: RRR; no murmurs, rubs, or gallops, no edema  Respiratory:  clear to  auscultation bilaterally, normal work of breathing GI: soft, nontender, nondistended, + BS MS: no deformity or atrophy Skin: warm and dry, sternal scar well healing as well as abdominal endoscopy site well healing. His main R leg endovascular harvest site has 1/2 cm erythema below incision and 1cm erythema above site without active suppuration. Black eschar is present. Neuro:  Alert and Oriented x 3, Strength and sensation are intact, follows commands Psych: euthymic mood, full affect  Wt Readings from Last 3 Encounters:  06/11/18 229 lb (103.9 kg)  06/10/18 229 lb (103.9 kg)  05/17/18 243 lb 1.6 oz (110.3 kg)      Studies/Labs Reviewed:   EKG:  EKG was ordered today and personally reviewed by me and demonstrates sinus bradycardia 59bpm, nonspecific TW changes, QTc calculated at 436ms (NSIVCD QRS 133ms)   Recent Labs: 05/02/2018: B Natriuretic Peptide 632.0 05/10/2018: ALT 22 05/12/2018: Magnesium 2.2 05/15/2018: BUN 18; Creatinine, Ser 0.99; Hemoglobin 11.1; Platelets 177; Potassium 3.9; Sodium 136   Lipid Panel No results found for: CHOL, TRIG, HDL, CHOLHDL, VLDL, LDLCALC, LDLDIRECT  Additional studies/ records that were reviewed today include: Summarized above   ASSESSMENT & PLAN:   1. CAD - doing well s/p bypass. He was noted to have purulence from leg wound this AM at PCP who prescribed an oral antibiotic x10 days as well as antibiotic cream. It does not sound like they've reached out to the surgeon's office so I called over to the CVTS office and spoke with coordinator Levonne Spiller. I got permission from the patient to take a photo of his wound and send it to her so that Dr. Prescott Gum can review in clinic. She says they will also reach out to the patient to check on him. He was asked not to put any creams on it until he hears from their office. Will check CBC today for trending purposes. He is nontoxic appearing and denies fevers/chills but was advised to reach out if any systolic  signs of infection. Continue ASA, BB, statin. His PCP has planned f/u of cholesterol later this month. Would recommend goal LDL <70. Given NSTEMI during his admission I will reach out to his primary cardiologist to inquire whether he should be considered for addition of Plavix to his regimen. 2. Aortic insufficiency - follow clinically.  3. Post-op atrial fibrillation - maintaining NSR on amiodarone. Anticipate this will be continued 8-12 weeks post-operatively, so will arrange f/u in our office in about 6-8 weeks to review. His prescription for amiodarone currently states to continue 200mg  twice a day until he is seen by the surgeon's office - he follows up there on 7/17. I'll review dosing with Dr. [name] as well. 4. Chronic diastolic CHF - he was treated with 10 days of Lasix/KCl and appears euvolemic. Lungs are clear and legs are without any edema. He has no orthopnea, PND, or weight gain. OK to remain off for now, but reviewed 2g sodium restriction, 2L fluid restriction, daily weights with patient. Esmeralda Links is paying close attention to these things. 5. PVCs - quiescent. Recheck BMET with labs to ensure stable potassium following post-discharge diuresis, and also check baseline TSH.  Disposition: F/u with Dr. [name] in 6-8 weeks.  Medication Adjustments/Labs and Tests Ordered: Current medicines are reviewed at length with the patient today.  Concerns regarding medicines are outlined above. Medication changes, Labs and Tests ordered today are summarized above and listed in the Patient Instructions accessible in Encounters.   Signed, Charlie Pitter, PA-C  06/11/2018 1:41 PM    [facility] [place]. [place], [place] [address] Ph: [phone]; Fax [phone]

## 2018-06-11 ENCOUNTER — Encounter: Payer: Self-pay | Admitting: Physician Assistant

## 2018-06-11 ENCOUNTER — Ambulatory Visit (INDEPENDENT_AMBULATORY_CARE_PROVIDER_SITE_OTHER): Payer: Medicare Other | Admitting: Physician Assistant

## 2018-06-11 ENCOUNTER — Other Ambulatory Visit (HOSPITAL_COMMUNITY)
Admission: RE | Admit: 2018-06-11 | Discharge: 2018-06-11 | Disposition: A | Discharge status: Home / Self Care | Payer: Medicare Other | Source: Ambulatory Visit | Attending: Physician Assistant | Admitting: Physician Assistant

## 2018-06-11 VITALS — BP 126/76 | HR 68 | Ht 71.0 in | Wt 229.0 lb

## 2018-06-11 DIAGNOSIS — I493 Ventricular premature depolarization: Secondary | ICD-10-CM | POA: Diagnosis not present

## 2018-06-11 DIAGNOSIS — R600 Localized edema: Secondary | ICD-10-CM | POA: Diagnosis not present

## 2018-06-11 DIAGNOSIS — I5032 Chronic diastolic (congestive) heart failure: Secondary | ICD-10-CM

## 2018-06-11 DIAGNOSIS — E782 Mixed hyperlipidemia: Secondary | ICD-10-CM | POA: Diagnosis not present

## 2018-06-11 DIAGNOSIS — I4891 Unspecified atrial fibrillation: Secondary | ICD-10-CM | POA: Diagnosis not present

## 2018-06-11 DIAGNOSIS — G4700 Insomnia, unspecified: Secondary | ICD-10-CM | POA: Diagnosis not present

## 2018-06-11 DIAGNOSIS — I9789 Other postprocedural complications and disorders of the circulatory system, not elsewhere classified: Secondary | ICD-10-CM

## 2018-06-11 DIAGNOSIS — F419 Anxiety disorder, unspecified: Secondary | ICD-10-CM | POA: Diagnosis not present

## 2018-06-11 DIAGNOSIS — Z951 Presence of aortocoronary bypass graft: Secondary | ICD-10-CM | POA: Diagnosis not present

## 2018-06-11 DIAGNOSIS — E1169 Type 2 diabetes mellitus with other specified complication: Secondary | ICD-10-CM | POA: Diagnosis not present

## 2018-06-11 DIAGNOSIS — I480 Paroxysmal atrial fibrillation: Secondary | ICD-10-CM | POA: Diagnosis not present

## 2018-06-11 DIAGNOSIS — I2510 Atherosclerotic heart disease of native coronary artery without angina pectoris: Secondary | ICD-10-CM | POA: Diagnosis not present

## 2018-06-11 DIAGNOSIS — I351 Nonrheumatic aortic (valve) insufficiency: Secondary | ICD-10-CM

## 2018-06-11 DIAGNOSIS — E291 Testicular hypofunction: Secondary | ICD-10-CM | POA: Diagnosis not present

## 2018-06-11 DIAGNOSIS — K219 Gastro-esophageal reflux disease without esophagitis: Secondary | ICD-10-CM | POA: Diagnosis not present

## 2018-06-11 DIAGNOSIS — L97909 Non-pressure chronic ulcer of unspecified part of unspecified lower leg with unspecified severity: Secondary | ICD-10-CM | POA: Diagnosis not present

## 2018-06-11 DIAGNOSIS — I10 Essential (primary) hypertension: Secondary | ICD-10-CM | POA: Diagnosis not present

## 2018-06-11 DIAGNOSIS — R05 Cough: Secondary | ICD-10-CM | POA: Diagnosis not present

## 2018-06-11 DIAGNOSIS — E119 Type 2 diabetes mellitus without complications: Secondary | ICD-10-CM | POA: Diagnosis not present

## 2018-06-11 DIAGNOSIS — Z6831 Body mass index (BMI) 31.0-31.9, adult: Secondary | ICD-10-CM | POA: Diagnosis not present

## 2018-06-11 LAB — TSH: TSH: 4.103 u[IU]/mL (ref 0.350–4.500)

## 2018-06-11 LAB — CBC WITH DIFFERENTIAL/PLATELET
BASOS PCT: 0 %
Basophils Absolute: 0 10*3/uL (ref 0.0–0.1)
EOS ABS: 0.6 10*3/uL (ref 0.0–0.7)
Eosinophils Relative: 5 %
HCT: 38.7 % — ABNORMAL LOW (ref 39.0–52.0)
HEMOGLOBIN: 12.9 g/dL — AB (ref 13.0–17.0)
Lymphocytes Relative: 22 %
Lymphs Abs: 2.6 10*3/uL (ref 0.7–4.0)
MCH: 30 pg (ref 26.0–34.0)
MCHC: 33.3 g/dL (ref 30.0–36.0)
MCV: 90 fL (ref 78.0–100.0)
Monocytes Absolute: 1.3 10*3/uL — ABNORMAL HIGH (ref 0.1–1.0)
Monocytes Relative: 11 %
NEUTROS PCT: 62 %
Neutro Abs: 7.1 10*3/uL (ref 1.7–7.7)
Platelets: 254 10*3/uL (ref 150–400)
RBC: 4.3 MIL/uL (ref 4.22–5.81)
RDW: 15.3 % (ref 11.5–15.5)
WBC: 11.6 10*3/uL — AB (ref 4.0–10.5)

## 2018-06-11 LAB — BASIC METABOLIC PANEL
Anion gap: 9 (ref 5–15)
BUN: 24 mg/dL — ABNORMAL HIGH (ref 8–23)
CALCIUM: 9 mg/dL (ref 8.9–10.3)
CO2: 23 mmol/L (ref 22–32)
CREATININE: 1.49 mg/dL — AB (ref 0.61–1.24)
Chloride: 101 mmol/L (ref 98–111)
GFR calc non Af Amer: 44 mL/min — ABNORMAL LOW (ref >60)
GFR, EST AFRICAN AMERICAN: 51 mL/min — AB (ref >60)
Glucose, Bld: 110 mg/dL — ABNORMAL HIGH (ref 70–99)
Potassium: 4.7 mmol/L (ref 3.5–5.1)
SODIUM: 133 mmol/L — AB (ref 135–145)

## 2018-06-11 NOTE — Patient Instructions (Addendum)
  Medication Instructions:  DO NOT TAKE LASIX OR POTASSIUM   DO NOT USE THE CREAM ON THE PLACE ON YOUR LEG, UNTIL YOU SEE THE VASCULAR DOCTOR   Labwork: TODAY   Testing/Procedures: NONE  Follow-Up: Your physician recommends that you schedule a follow-up appointment in: 6-8 WEEKS    Any Other Special Instructions Will Be Listed Below (If Applicable).     If you need a refill on your cardiac medications before your next appointment, please call your pharmacy.   For patients with congestive heart failure, we give them these special instructions:  1. Follow a low-salt diet - you are allowed no more than 2,000mg  of sodium per day. Watch your fluid intake. In general, you should not be taking in more than 2 liters of fluid per day (no more than 8 glasses per day). This includes sources of water in foods like soup, coffee, tea, milk, etc. 2. Weigh yourself on the same scale at same time of day and keep a log. 3. Call your doctor: (Anytime you feel any of the following symptoms)  - 3lb weight gain overnight or 5lb within a few days - Shortness of breath, with or without a dry hacking cough  - Swelling in the hands, feet or stomach  - If you have to sleep on extra pillows at night in order to breathe   IT IS IMPORTANT TO LET YOUR DOCTOR KNOW EARLY ON IF YOU ARE HAVING SYMPTOMS SO WE CAN HELP YOU!

## 2018-06-15 ENCOUNTER — Telehealth: Payer: Self-pay

## 2018-06-15 ENCOUNTER — Other Ambulatory Visit: Payer: Self-pay | Admitting: Cardiothoracic Surgery

## 2018-06-15 DIAGNOSIS — Z79899 Other long term (current) drug therapy: Secondary | ICD-10-CM

## 2018-06-15 DIAGNOSIS — Z951 Presence of aortocoronary bypass graft: Secondary | ICD-10-CM

## 2018-06-15 NOTE — Telephone Encounter (Signed)
-----   Message from Charlie Pitter, PA-C sent at 06/11/2018  5:03 PM EDT ----- WBC decreasing from prior, Hgb stable. I called patient. Na a little low c/w prior but BUN/Cr appear increased from baseline. No longer on diuretics. I would suggest increasing fluid intake for the next few days (not going crazy but not as strict restriction as before). Spoke with patient, he hasn't been drinking a lot at all. Gave him permission to drink 64oz per day to build up stores Not on any other nephrotoxic meds. Discussed avoidance of NSAIDs. Will forward to nurse to try and get him in early next week for repeat BMET to trend Dayna Dunn PA-C

## 2018-06-15 NOTE — Telephone Encounter (Signed)
Called pt. Notified that he will need to come in early next week for repeat labs. Lab order placed. Mailed to pt.

## 2018-06-16 ENCOUNTER — Encounter: Payer: Self-pay | Admitting: Cardiothoracic Surgery

## 2018-06-16 ENCOUNTER — Ambulatory Visit (INDEPENDENT_AMBULATORY_CARE_PROVIDER_SITE_OTHER): Payer: Self-pay | Admitting: Cardiothoracic Surgery

## 2018-06-16 ENCOUNTER — Ambulatory Visit
Admission: RE | Admit: 2018-06-16 | Discharge: 2018-06-16 | Disposition: A | Discharge status: Home / Self Care | Payer: Medicare Other | Source: Ambulatory Visit | Attending: Cardiothoracic Surgery | Admitting: Cardiothoracic Surgery

## 2018-06-16 ENCOUNTER — Other Ambulatory Visit: Payer: Self-pay

## 2018-06-16 ENCOUNTER — Other Ambulatory Visit: Payer: Self-pay | Admitting: *Deleted

## 2018-06-16 VITALS — BP 120/71 | HR 61 | Resp 18 | Ht 71.0 in | Wt 224.0 lb

## 2018-06-16 DIAGNOSIS — I2510 Atherosclerotic heart disease of native coronary artery without angina pectoris: Secondary | ICD-10-CM

## 2018-06-16 DIAGNOSIS — Z951 Presence of aortocoronary bypass graft: Secondary | ICD-10-CM

## 2018-06-16 NOTE — Progress Notes (Signed)
PCP is Celene Squibb, MD Referring Provider is Belva Crome, MD  Chief Complaint  Patient presents with  . Routine Post Op    f/u from surgery with CXR s/p CABG x4, 05/11/18    HPI: 1 month postop office visit after urgent CABG x4 following non-STEMI Postop A. fib now sinus rhythm.  Amiodarone Postop superficial leg wound infection with drainage, culture positive for Enterobacter-now on oral Bactrim which covers organism Walking half mile a day. Sternal incision healing well. No recurrent angina.  Past Medical History:  Diagnosis Date  . Aortic insufficiency    a. mild-mod by TEE 05/2018 (no aortic stenosis).  Marland Kitchen CAD (coronary artery disease)    a. NSTEMI 05/2018 with multivessel disease -> s/p CABGx4 05/11/18.  Marland Kitchen Chronic diastolic CHF (congestive heart failure) ([place])    a. dx 05/2018.  . CKD (chronic kidney disease), stage III ([place])   . History of nephrolithiasis 2004  . Hyperlipidemia   . Hypertension   . IBS (irritable colon syndrome)   . Postoperative atrial fibrillation ([place])   . PVC's (premature ventricular contractions)   . Reflux   . Type 2 diabetes mellitus ([place])     Past Surgical History:  Procedure Laterality Date  . CORONARY ARTERY BYPASS GRAFT N/A 05/11/2018   Procedure: CORONARY ARTERY BYPASS GRAFTING (CABG), on pump, times four, using left internal mammary artery and endoscopically harvested right greater saphenous leg vein.;  Surgeon: Ivin Poot, MD;  Location: [place];  Service: Open Heart Surgery;  Laterality: N/A;  . ESOPHAGOGASTRODUODENOSCOPY  02/04/2012   Procedure: ESOPHAGOGASTRODUODENOSCOPY (EGD);  Surgeon: Rogene Houston, MD;  [facility];  Service: Endoscopy;  Laterality: N/A;  100  . LEFT HEART CATH AND CORONARY ANGIOGRAPHY N/A 05/04/2018   Procedure: LEFT HEART CATH AND CORONARY ANGIOGRAPHY;  Surgeon: Belva Crome, MD;  Location: [place] CV LAB;  Service: Cardiovascular;  Laterality: N/A;  . NASAL FRACTURE SURGERY    . TEE WITHOUT  CARDIOVERSION N/A 05/11/2018   Procedure: TRANSESOPHAGEAL ECHOCARDIOGRAM (TEE);  Surgeon: Prescott Gum, Collier Salina, MD;  Location: [facility] [facility];  Service: Open Heart Surgery;  Laterality: N/A;    Family History  Problem Relation Age of Onset  . Diabetes Mellitus II Mother   . Heart attack Father   . Heart attack Brother   . Heart disease Unknown   . Arthritis Unknown   . Asthma Unknown   . Diabetes Unknown     Social History Social History   Tobacco Use  . Smoking status: Never Smoker  . Smokeless tobacco: Never Used  Substance Use Topics  . Alcohol use: No    Alcohol/week: 0.0 oz  . Drug use: No    Current Outpatient Medications  Medication Sig Dispense Refill  . ALPRAZolam (XANAX) 0.5 MG tablet Take 0.5 mg by mouth at bedtime as needed for sleep.    Marland Kitchen amiodarone (PACERONE) 200 MG tablet Please take 2 tabs (400mg ) twice a day for three days then 1 tab (200mg ) twice a day until we see you in the office. 70 tablet 0  . aspirin 325 MG EC tablet Take 1 tablet (325 mg total) by mouth daily. 30 tablet 0  . atorvastatin (LIPITOR) 80 MG tablet Take 1 tablet (80 mg total) by mouth daily at 6 PM. 30 tablet 1  . bisacodyl (DULCOLAX) 5 MG EC tablet Take 5 mg by mouth daily as needed for moderate constipation.    . cloNIDine (CATAPRES) 0.2 MG tablet Take 1 tablet (  0.2 mg total) by mouth 2 (two) times daily. 60 tablet 1  . DEXILANT 60 MG capsule TAKE ONE CAPSULE BY MOUTH EVERY DAY. 30 capsule 5  . hydrALAZINE (APRESOLINE) 25 MG tablet Take 1 tablet (25 mg total) by mouth every 8 (eight) hours. 90 tablet 1  . metoprolol tartrate (LOPRESSOR) 50 MG tablet Take 1 tablet (50 mg total) by mouth 2 (two) times daily. 60 tablet 1  . oxyCODONE (OXY IR/ROXICODONE) 5 MG immediate release tablet Take 1 tablet (5 mg total) by mouth every 6 (six) hours as needed for severe pain. 30 tablet 0  . sitaGLIPtan-metformin (JANUMET) 50-1000 MG per tablet Take 1 tablet by mouth 2 (two) times daily with a meal.      .  sulfamethoxazole-trimethoprim (BACTRIM DS,SEPTRA DS) 800-160 MG tablet Take 1 tablet by mouth 2 (two) times daily.     No current facility-administered medications for this visit.     No Known Allergies  Review of Systems  No fever Sleeping and appetite improved Gaining strength  BP 120/71 (BP Location: Left Arm, Patient Position: Sitting, Cuff Size: Normal)   Pulse 61   Resp 18   Ht 5\' 11"  (1.803 m)   Wt 224 lb (101.6 kg)   SpO2 95% Comment: RA  BMI 31.24 kg/m  Physical Exam      Exam    General- alert and comfortable    Neck- no JVD, no cervical adenopathy palpable, no carotid bruit   Lungs- clear without rales, wheezes   Cor- regular rate and rhythm, no murmur , gallop   Abdomen- soft, non-tender   Extremities - warm, non-tender, minimal edema   Neuro- oriented, appropriate, no focal weakness     Diagnostic Tests: Sternal wires intact on chest x-ray, no pleural effusion  Impression: Excellent early recovery after urgent CABG x4 Ready to start driving and cardiac rehab.  Plan: Refer to  [facility] outpatient cardiac rehab.  Stop amiodarone.  Wound care with Neosporin dressing changes instructed patient and son.  Return in 10 days for wound check and in 4 to 6 weeks for final postop visit.  No lifting more than 20 pounds until return for final visit.   Len Childs, MD [facility] [phone]

## 2018-06-17 ENCOUNTER — Other Ambulatory Visit: Payer: Self-pay | Admitting: Physician Assistant

## 2018-06-18 ENCOUNTER — Other Ambulatory Visit: Payer: Self-pay | Admitting: Adult Health Nurse Practitioner

## 2018-06-18 ENCOUNTER — Other Ambulatory Visit: Payer: Self-pay

## 2018-06-18 NOTE — Patient Outreach (Signed)
[facility]) Care Management    06/18/2018    Alejandro [name] 04-04-1941 [id number]    Outreach call complete. Alejandro [name] reported that he experienced nausea and vomiting after eating out on yesterday. He reported decreased intake on today due to episodes of nausea. Alejandro [name] was certain that episodes were related to the meal and declined a need for acute visit with PCP.    Alejandro [name] reported he was able to tolerate fluids and took his medications as prescribed. He denied complaints of shortness of breath or chest discomfort. Also denied symptoms of hypoglycemia.   RN CM contacted PCP office to inform MD of member's symptoms and requested order for recommended antiemetic. MD was out of the office. Staf confirmed that medication was ordered by NP and available for pick at [facility]. RN CM informed Alejandro [name]. He confirmed that a family member was available to pick up medication. Member denied other urgent concerns and reported that he would notify RN CM if needed.     THN CM Care Plan Problem One     Most Recent Value  Care Plan Problem One  Risk for readmission related to heart failure complications.  Role Documenting the Problem One  Care Management Coordinator  Care Plan for Problem One  Active  THN Long Term Goal   Over the next 60 days patient will not have a hospitalization related to hear failure.  [facility]N Long Term Goal Start Date  05/27/18  Interventions for Problem One Long Term Goal  RN CM reviewed symptoms of heart failure complications.  THN CM Short Term Goal #1   Over the next 30 days patient will attend all scheduled follow up appointments.  THN CM Short Term Goal #1 Start Date  05/27/18  Interventions for Short Term Goal #1  Discussed and confirmed that member has transportation for scheduled follow up appointments.  THN CM Short Term Goal #2   Over the next 30 days patient will take medications as prescribed.  THN CM Short Term Goal #2 Start Date   05/27/18  Interventions for Short Term Goal #2  RN CM discussed importance of medication compliance. Contacted MD office for pending medication order.      PLAN RN CM will follow up for routine home visit on 06/22/18.   [place] [phone]

## 2018-06-19 DIAGNOSIS — A084 Viral intestinal infection, unspecified: Secondary | ICD-10-CM | POA: Diagnosis not present

## 2018-06-19 DIAGNOSIS — Z6831 Body mass index (BMI) 31.0-31.9, adult: Secondary | ICD-10-CM | POA: Diagnosis not present

## 2018-06-19 DIAGNOSIS — R112 Nausea with vomiting, unspecified: Secondary | ICD-10-CM | POA: Diagnosis not present

## 2018-06-20 DIAGNOSIS — Z6831 Body mass index (BMI) 31.0-31.9, adult: Secondary | ICD-10-CM | POA: Diagnosis not present

## 2018-06-20 DIAGNOSIS — K5900 Constipation, unspecified: Secondary | ICD-10-CM | POA: Diagnosis not present

## 2018-06-22 ENCOUNTER — Other Ambulatory Visit: Payer: Self-pay

## 2018-06-22 NOTE — Patient Outreach (Signed)
[facility] ([facility]) Care Management   06/22/2018  Alejandro [name] 03/15/1941 [id number]  Alejandro [name] is an 77 y.o. male  Subjective:  Home visit complete. Member alert and oriented x3. No complaints of discomfort at RNCM's arrival.  Objective:   BP 118/72 (BP Location: Left Arm, Patient Position: Sitting, Cuff Size: Normal)   Pulse 77   Resp 20   Ht 1.803 m (5' 11")   Wt 223 lb (101.2 kg)   SpO2 99%   BMI 31.10 kg/m   Review of Systems  Constitutional: Positive for malaise/fatigue.  HENT: Positive for tinnitus.        Patient reported occasional "ringing" in ears.  Eyes: Negative.   Respiratory: Positive for cough.   Cardiovascular: Negative.   Gastrointestinal: Positive for constipation and nausea.       Reported nausea and constipation over the weekend. No symptoms today.  Genitourinary: Negative.   Musculoskeletal: Negative.        Patient reported occasional pain in right foot over the weekend. No complaints of discomfort today.  Skin: Negative.   Neurological: Negative.   Psychiatric/Behavioral: Negative.     Physical Exam  Constitutional: He is oriented to person, place, and time. He appears well-developed.  Cardiovascular: Normal rate.  Respiratory: Effort normal.  GI: Soft. Bowel sounds are normal.  Neurological: He is alert and oriented to person, place, and time.  Skin: Skin is warm and dry.  Psychiatric: He has a normal mood and affect. His behavior is normal. Judgment and thought content normal.    Encounter Medications:   Outpatient Encounter Medications as of 06/22/2018  Medication Sig  . ondansetron (ZOFRAN) 4 MG tablet Take 4 mg by mouth every 8 (eight) hours as needed for nausea or vomiting. Take 1 Tablet by mouth every 6 hour as needed.  . ALPRAZolam (XANAX) 0.5 MG tablet Take 0.5 mg by mouth at bedtime as needed for sleep.  . amiodarone (PACERONE) 200 MG tablet Please take 2 tabs (400mg) twice a day for three days then 1 tab (200mg)  twice a day until we see you in the office.  . aspirin 325 MG EC tablet Take 1 tablet (325 mg total) by mouth daily.  . atorvastatin (LIPITOR) 80 MG tablet Take 1 tablet (80 mg total) by mouth daily at 6 PM.  . bisacodyl (DULCOLAX) 5 MG EC tablet Take 5 mg by mouth daily as needed for moderate constipation.  . cloNIDine (CATAPRES) 0.2 MG tablet Take 1 tablet (0.2 mg total) by mouth 2 (two) times daily.  . DEXILANT 60 MG capsule TAKE ONE CAPSULE BY MOUTH EVERY DAY.  . hydrALAZINE (APRESOLINE) 25 MG tablet Take 1 tablet (25 mg total) by mouth every 8 (eight) hours.  . metoprolol tartrate (LOPRESSOR) 50 MG tablet Take 1 tablet (50 mg total) by mouth 2 (two) times daily.  . oxyCODONE (OXY IR/ROXICODONE) 5 MG immediate release tablet Take 1 tablet (5 mg total) by mouth every 6 (six) hours as needed for severe pain.  . sitaGLIPtan-metformin (JANUMET) 50-1000 MG per tablet Take 1 tablet by mouth 2 (two) times daily with a meal.    . sulfamethoxazole-trimethoprim (BACTRIM DS,SEPTRA DS) 800-160 MG tablet Take 1 tablet by mouth 2 (two) times daily.   No facility-administered encounter medications on file as of 06/22/2018.     Functional Status:   In your present state of health, do you have any difficulty performing the following activities: 06/22/2018 05/02/2018  Hearing? N -  Vision? N -    Difficulty concentrating or making decisions? N -  Walking or climbing stairs? Y -  Comment Difficulty climbing stairs. -  Dressing or bathing? N -  Doing errands, shopping? Y Y  Preparing Food and eating ? N -  Using the Toilet? N -  In the past six months, have you accidently leaked urine? N -  Do you have problems with loss of bowel control? N -  Managing your Medications? Y -  Comment Patient reported that grandson  prepares management. -  Managing your Finances? N -  Housekeeping or managing your Housekeeping? Y -  Some recent data might be hidden    Fall/Depression Screening:    Fall Risk  06/22/2018  05/27/2018  Falls in the past year? No No  Risk for fall due to : Impaired balance/gait -   PHQ 2/9 Scores 06/22/2018 05/27/2018  PHQ - 2 Score 0 1    Assessment:   Home visit complete. Alejandro [name] reported episodes of nausea and constipation over the weekend. Symptoms resolved, and he reports feeling better today. No complaints of shortness of breath or chest discomfort. Reported medication compliance and attempting to follow recommended diet. Ambulating well and denied falls. Alejandro [name] is progressing well and reported no urgent needs or concerns. Agreed to contact RNCM if needed prior to next outreach.   [facility] CM Care Plan Problem One     Most Recent Value  Care Plan Problem One  Risk for readmission related to heart failure complications.  Role Documenting the Problem One  Care Management Coordinator  Care Plan for Problem One  Active  [facility] Long Term Goal   Over the next 60 days patient will not have a hospitalization related to heart failure.  [facility] Long Term Goal Start Date  05/27/18  Interventions for Problem One Long Term Goal  Discussed medications, nutrition and recommendations for daily activity. Reviewed s/sx of DM complications. Discussed s/sx of complications related to MI. Reviewed worsening symptoms that require urgent MD follow up.  [facility] CM Short Term Goal #1   Over the next 30 days patient will attend all scheduled follow up appointments.  [facility] CM Short Term Goal #1 Start Date  05/27/18  [facility] CM Short Term Goal #1 Met Date  06/22/18  [facility] CM Short Term Goal #2   Over the next 30 days patient will take medications as prescribed.  [facility] CM Short Term Goal #2 Start Date  05/27/18  [facility] CM Short Term Goal #2 Met Date  06/22/18      PLAN Will follow up on next week.   ,RN [facility] Community Care Manager [phone]     

## 2018-06-27 ENCOUNTER — Other Ambulatory Visit: Payer: Self-pay | Admitting: Physician Assistant

## 2018-06-28 ENCOUNTER — Other Ambulatory Visit: Payer: Self-pay

## 2018-06-28 ENCOUNTER — Ambulatory Visit: Payer: Self-pay | Admitting: Physician Assistant

## 2018-06-28 ENCOUNTER — Encounter: Payer: Self-pay | Admitting: Physician Assistant

## 2018-06-28 VITALS — BP 177/90 | HR 60 | Resp 18 | Ht 71.0 in | Wt 224.0 lb

## 2018-06-28 DIAGNOSIS — Z951 Presence of aortocoronary bypass graft: Secondary | ICD-10-CM

## 2018-06-28 NOTE — Progress Notes (Signed)
[address]       [place] [address]             [phone]      Alejandro [name] is a 77 y.o. male patient who underwent coronary bypass grafting x4 on 05/11/2018.  He has been followed in the office by Dr. Prescott Gum for his [facility] wound care.   1. S/P CABG x 4    Past Medical History:  Diagnosis Date  . Aortic insufficiency    a. mild-mod by TEE 05/2018 (no aortic stenosis).  Marland Kitchen CAD (coronary artery disease)    a. NSTEMI 05/2018 with multivessel disease -> s/p CABGx4 05/11/18.  Marland Kitchen Chronic diastolic CHF (congestive heart failure) ([place])    a. dx 05/2018.  . CKD (chronic kidney disease), stage III ([place])   . History of nephrolithiasis 2004  . Hyperlipidemia   . Hypertension   . IBS (irritable colon syndrome)   . Postoperative atrial fibrillation ([place])   . PVC's (premature ventricular contractions)   . Reflux   . Type 2 diabetes mellitus ([facility])    No past surgical history pertinent negatives on file.    Scheduled Meds: Current Outpatient Medications on File Prior to Visit  Medication Sig Dispense Refill  . ALPRAZolam (XANAX) 0.5 MG tablet Take 0.5 mg by mouth at bedtime as needed for sleep.    Marland Kitchen amiodarone (PACERONE) 200 MG tablet Please take 2 tabs (400mg ) twice a day for three days then 1 tab (200mg ) twice a day until we see you in the office. 70 tablet 0  . aspirin 325 MG EC tablet Take 1 tablet (325 mg total) by mouth daily. 30 tablet 0  . atorvastatin (LIPITOR) 80 MG tablet Take 1 tablet (80 mg total) by mouth daily at 6 PM. 30 tablet 1  . bisacodyl (DULCOLAX) 5 MG EC tablet Take 5 mg by mouth daily as needed for moderate constipation.    . cloNIDine (CATAPRES) 0.2 MG tablet Take 1 tablet (0.2 mg total) by mouth 2 (two) times daily. 60 tablet 1  . DEXILANT 60 MG capsule TAKE ONE CAPSULE BY MOUTH EVERY DAY. 30 capsule 5  . hydrALAZINE (APRESOLINE) 25 MG tablet Take 1 tablet (25 mg total) by mouth every 8 (eight) hours. 90 tablet 1  . metoprolol tartrate  (LOPRESSOR) 50 MG tablet Take 1 tablet (50 mg total) by mouth 2 (two) times daily. 60 tablet 1  . ondansetron (ZOFRAN) 4 MG tablet Take 4 mg by mouth every 8 (eight) hours as needed for nausea or vomiting. Take 1 Tablet by mouth every 6 hour as needed.    Marland Kitchen oxyCODONE (OXY IR/ROXICODONE) 5 MG immediate release tablet Take 1 tablet (5 mg total) by mouth every 6 (six) hours as needed for severe pain. 30 tablet 0  . sitaGLIPtan-metformin (JANUMET) 50-1000 MG per tablet Take 1 tablet by mouth 2 (two) times daily with a meal.       No current facility-administered medications on file prior to visit.     Blood pressure (!) 177/90, pulse 60, resp. rate 18, height 5\' 11"  (1.803 m), weight 101.6 kg (224 lb), SpO2 98 %.  Subjective   Over all he is doing well. Has a cold today. EVH incision is healing well.    Objective  Cor: RRR, holosystolic murmur Pulm: CTA bilaterally and in all fields Abd:no tenderness Wound: sternal wound is c/d/i, right EVH site is closed without drainage. Hardened tissue around the incision Ext:  no edema   Assessment & Plan   Wound has improved. No drainage. Continue to clean daily with soap and water. He is putting some alcohol on it at night which is fine. His BP was elevated today however, the patient admits to eating a lot of salt today and is salt sensitive. His BP 2 weeks ago was normal on the same medication regimen. I will not make any changes today. 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 He will return on 8/28 for his routine appointment unless the wound begins draining or become more red. He is welcome to call us with any questions or concerns. He does not need a chest xray at his next appointment since he has had one post-op.     [name] 06/28/2018

## 2018-06-29 ENCOUNTER — Other Ambulatory Visit: Payer: Self-pay

## 2018-06-29 DIAGNOSIS — E782 Mixed hyperlipidemia: Secondary | ICD-10-CM | POA: Diagnosis not present

## 2018-06-29 DIAGNOSIS — E1169 Type 2 diabetes mellitus with other specified complication: Secondary | ICD-10-CM | POA: Diagnosis not present

## 2018-06-29 DIAGNOSIS — Z0000 Encounter for general adult medical examination without abnormal findings: Secondary | ICD-10-CM | POA: Diagnosis not present

## 2018-06-29 DIAGNOSIS — I10 Essential (primary) hypertension: Secondary | ICD-10-CM | POA: Diagnosis not present

## 2018-06-29 NOTE — Patient Outreach (Signed)
[facility]) Care Management  06/29/2018  Alejandro [name] [birth year] [id number]    Routine outreach call placed to Alejandro [name]. RN CM spoke with Alejandro [name] and his grandson Alejandro [name]. HIPAA identifiers obtained. Member reported no urgent concerns and stated he was doing well today. He reported that previous symptoms of GI upset have resolved.  Alejandro [name] confirmed that he obtained all of the prescribed refills and member is compliant with medications.   PLAN RN CM will follow up in one week.   [place] [phone]

## 2018-07-02 ENCOUNTER — Other Ambulatory Visit: Payer: Self-pay

## 2018-07-04 ENCOUNTER — Other Ambulatory Visit: Payer: Self-pay | Admitting: Physician Assistant

## 2018-07-05 DIAGNOSIS — K219 Gastro-esophageal reflux disease without esophagitis: Secondary | ICD-10-CM | POA: Diagnosis not present

## 2018-07-05 DIAGNOSIS — R05 Cough: Secondary | ICD-10-CM | POA: Diagnosis not present

## 2018-07-05 DIAGNOSIS — F419 Anxiety disorder, unspecified: Secondary | ICD-10-CM | POA: Diagnosis not present

## 2018-07-05 DIAGNOSIS — E119 Type 2 diabetes mellitus without complications: Secondary | ICD-10-CM | POA: Diagnosis not present

## 2018-07-05 DIAGNOSIS — E782 Mixed hyperlipidemia: Secondary | ICD-10-CM | POA: Diagnosis not present

## 2018-07-05 DIAGNOSIS — I2510 Atherosclerotic heart disease of native coronary artery without angina pectoris: Secondary | ICD-10-CM | POA: Diagnosis not present

## 2018-07-05 DIAGNOSIS — E1169 Type 2 diabetes mellitus with other specified complication: Secondary | ICD-10-CM | POA: Diagnosis not present

## 2018-07-05 DIAGNOSIS — I10 Essential (primary) hypertension: Secondary | ICD-10-CM | POA: Diagnosis not present

## 2018-07-05 DIAGNOSIS — G4700 Insomnia, unspecified: Secondary | ICD-10-CM | POA: Diagnosis not present

## 2018-07-05 DIAGNOSIS — R109 Unspecified abdominal pain: Secondary | ICD-10-CM | POA: Diagnosis not present

## 2018-07-05 DIAGNOSIS — I480 Paroxysmal atrial fibrillation: Secondary | ICD-10-CM | POA: Diagnosis not present

## 2018-07-05 DIAGNOSIS — R600 Localized edema: Secondary | ICD-10-CM | POA: Diagnosis not present

## 2018-07-05 DIAGNOSIS — Z6831 Body mass index (BMI) 31.0-31.9, adult: Secondary | ICD-10-CM | POA: Diagnosis not present

## 2018-07-05 DIAGNOSIS — E291 Testicular hypofunction: Secondary | ICD-10-CM | POA: Diagnosis not present

## 2018-07-05 DIAGNOSIS — K921 Melena: Secondary | ICD-10-CM | POA: Diagnosis not present

## 2018-07-06 ENCOUNTER — Other Ambulatory Visit: Payer: Self-pay

## 2018-07-06 NOTE — Patient Outreach (Signed)
[facility]) Care Management  07/06/2018  Alejandro [name] Apr 27, 1941 [id number]   RN CM contacted [name] for routine follow up. Member unavailable. Unable to leave a voice message due to voice mail box being full.   PLAN RN CM will follow up next week.   [place] [phone]

## 2018-07-07 DIAGNOSIS — Z1211 Encounter for screening for malignant neoplasm of colon: Secondary | ICD-10-CM | POA: Diagnosis not present

## 2018-07-11 ENCOUNTER — Other Ambulatory Visit: Payer: Self-pay | Admitting: Physician Assistant

## 2018-07-12 ENCOUNTER — Other Ambulatory Visit: Payer: Self-pay | Admitting: Cardiology

## 2018-07-19 ENCOUNTER — Other Ambulatory Visit: Payer: Self-pay

## 2018-07-19 DIAGNOSIS — H25013 Cortical age-related cataract, bilateral: Secondary | ICD-10-CM | POA: Diagnosis not present

## 2018-07-19 DIAGNOSIS — H35033 Hypertensive retinopathy, bilateral: Secondary | ICD-10-CM | POA: Diagnosis not present

## 2018-07-19 DIAGNOSIS — H2513 Age-related nuclear cataract, bilateral: Secondary | ICD-10-CM | POA: Diagnosis not present

## 2018-07-19 DIAGNOSIS — E119 Type 2 diabetes mellitus without complications: Secondary | ICD-10-CM | POA: Diagnosis not present

## 2018-07-19 DIAGNOSIS — H34211 Partial retinal artery occlusion, right eye: Secondary | ICD-10-CM | POA: Diagnosis not present

## 2018-07-19 NOTE — Patient Outreach (Signed)
[facility]) Care Management  07/19/2018  KELSEY EDMAN 1941-08-15 [id number]    Routine outreach call placed. Outreach call unsuccessful. RN CM was unable to leave a voice message due to voice mailbox being full.   PLAN Will send unsuccessful outreach letter. Will attempt to contact Mr. Baer within 3-4 business days.   [place] [phone]

## 2018-07-22 ENCOUNTER — Other Ambulatory Visit: Payer: Self-pay

## 2018-07-22 NOTE — Patient Outreach (Signed)
[facility]) Care Management  07/22/2018  Alejandro [name] 06/13/1941 [id number]   Successful outreach with Alejandro [name]. Reports feeling "very good" today. Reported increased activity tolerance and going out more. Continues to ambulate well inside and outside of the home without shortness of breath or chest discomfort. Reported compliance with medications. His grandson Erlene Quan has been assisting with medication preparation and obtaining required refills.  Discussed goals and care management needs. Alejandro [name] reported that his grandson and company staff are available to provide transportation and assist as needed. Denied need for additional community services. No concerns regarding medication affordability. Agreeable to follow up assessment and possible community case closure on next month.  [facility] CM Care Plan Problem One     Most Recent Value  Care Plan Problem One  Risk for readmission related to heart failure complications.  Role Documenting the Problem One  Care Management Coordinator  Care Plan for Problem One  Active  [facility] Long Term Goal   Over the next 60 days patient will not have a hospitalization related to heart failure.  [facility] Long Term Goal Start Date  05/27/18  Interventions for Problem One Long Term Goal  Reviewed medications. Discussed worsening s/sx that require immediate medical attention.  [facility] CM Short Term Goal #1   Over the next 30 days patient will attend all scheduled follow up appointments.  [facility] CM Short Term Goal #1 Start Date  05/27/18  [facility] CM Short Term Goal #1 Met Date  06/22/18  [facility] CM Short Term Goal #2   Over the next 30 days patient will take medications as prescribed.  [facility] CM Short Term Goal #2 Start Date  05/27/18  [facility] CM Short Term Goal #2 Met Date  06/22/18     PLAN Will follow up on next month.   [place] [phone]

## 2018-07-28 ENCOUNTER — Ambulatory Visit (INDEPENDENT_AMBULATORY_CARE_PROVIDER_SITE_OTHER): Payer: Self-pay | Admitting: Cardiothoracic Surgery

## 2018-07-28 ENCOUNTER — Encounter: Payer: Self-pay | Admitting: Cardiothoracic Surgery

## 2018-07-28 ENCOUNTER — Other Ambulatory Visit: Payer: Self-pay

## 2018-07-28 VITALS — BP 146/83 | HR 59 | Resp 18 | Ht 71.0 in | Wt 233.8 lb

## 2018-07-28 DIAGNOSIS — Z951 Presence of aortocoronary bypass graft: Secondary | ICD-10-CM

## 2018-07-28 NOTE — Progress Notes (Signed)
PCP is Celene Squibb, MD Referring Provider is Skeet Latch, MD  Chief Complaint  Patient presents with  . Routine Post Op    f/u, s/p CABG x4 05/11/18  . Coronary Artery Disease    HPI: Final postop visit for this obese diabetic patient who had urgent multivessel CABG in mid June.  He is now 10 weeks postop.  He is scheduled to start cardiac rehab at [facility].  Surgical incisions are healed.  He has no recurrent angina.  I reviewed his medications and the importance of a heart healthy diet and heart healthy lifestyle.  Last postop chest x-ray reviewed and was clear.   Past Medical History:  Diagnosis Date  . Aortic insufficiency    a. mild-mod by TEE 05/2018 (no aortic stenosis).  Marland Kitchen CAD (coronary artery disease)    a. NSTEMI 05/2018 with multivessel disease -> s/p CABGx4 05/11/18.  Marland Kitchen Chronic diastolic CHF (congestive heart failure) ([place])    a. dx 05/2018.  . CKD (chronic kidney disease), stage III ([place])   . History of nephrolithiasis 2004  . Hyperlipidemia   . Hypertension   . IBS (irritable colon syndrome)   . Postoperative atrial fibrillation ([place])   . PVC's (premature ventricular contractions)   . Reflux   . Type 2 diabetes mellitus ([place])     Past Surgical History:  Procedure Laterality Date  . CORONARY ARTERY BYPASS GRAFT N/A 05/11/2018   Procedure: CORONARY ARTERY BYPASS GRAFTING (CABG), on pump, times four, using left internal mammary artery and endoscopically harvested right greater saphenous leg vein.;  Surgeon: Ivin Poot, MD;  Location: [facility];  Service: Open Heart Surgery;  Laterality: N/A;  . ESOPHAGOGASTRODUODENOSCOPY  02/04/2012   Procedure: ESOPHAGOGASTRODUODENOSCOPY (EGD);  Surgeon: Rogene Houston, MD;  [facility];  Service: Endoscopy;  Laterality: N/A;  100  . LEFT HEART CATH AND CORONARY ANGIOGRAPHY N/A 05/04/2018   Procedure: LEFT HEART CATH AND CORONARY ANGIOGRAPHY;  Surgeon: Belva Crome, MD;  Location: [facility];  Service:  Cardiovascular;  Laterality: N/A;  . NASAL FRACTURE SURGERY    . TEE WITHOUT CARDIOVERSION N/A 05/11/2018   Procedure: TRANSESOPHAGEAL ECHOCARDIOGRAM (TEE);  Surgeon: Prescott Gum, Collier Salina, MD;  Location: [facility];  Service: Open Heart Surgery;  Laterality: N/A;    Family History  Problem Relation Age of Onset  . Diabetes Mellitus II Mother   . Heart attack Father   . Heart attack Brother   . Heart disease Unknown   . Arthritis Unknown   . Asthma Unknown   . Diabetes Unknown     Social History Social History   Tobacco Use  . Smoking status: Never Smoker  . Smokeless tobacco: Never Used  Substance Use Topics  . Alcohol use: No    Alcohol/week: 0.0 standard drinks  . Drug use: No    Current Outpatient Medications  Medication Sig Dispense Refill  . ALPRAZolam (XANAX) 0.5 MG tablet Take 0.5 mg by mouth at bedtime as needed for sleep.    Marland Kitchen amiodarone (PACERONE) 200 MG tablet Please take 2 tabs (400mg ) twice a day for three days then 1 tab (200mg ) twice a day until we see you in the office. 70 tablet 0  . aspirin 325 MG EC tablet Take 1 tablet (325 mg total) by mouth daily. 30 tablet 0  . atorvastatin (LIPITOR) 80 MG tablet TAKE 1 TABLET BY MOUTH EVERY DAY AT 6 PM 30 tablet 6  . bisacodyl (DULCOLAX) 5 MG EC tablet  Take 5 mg by mouth daily as needed for moderate constipation.    . cloNIDine (CATAPRES) 0.2 MG tablet Take 1 tablet (0.2 mg total) by mouth 2 (two) times daily. 60 tablet 1  . DEXILANT 60 MG capsule TAKE ONE CAPSULE BY MOUTH EVERY DAY. 30 capsule 5  . hydrALAZINE (APRESOLINE) 25 MG tablet Take 1 tablet (25 mg total) by mouth every 8 (eight) hours. 90 tablet 1  . metoprolol tartrate (LOPRESSOR) 50 MG tablet TAKE 1 TABLET BY MOUTH TWICE DAILY 180 tablet 2  . ondansetron (ZOFRAN) 4 MG tablet Take 4 mg by mouth every 8 (eight) hours as needed for nausea or vomiting. Take 1 Tablet by mouth every 6 hour as needed.    Marland Kitchen oxyCODONE (OXY IR/ROXICODONE) 5 MG immediate release tablet Take 1  tablet (5 mg total) by mouth every 6 (six) hours as needed for severe pain. 30 tablet 0  . sitaGLIPtan-metformin (JANUMET) 50-1000 MG per tablet Take 1 tablet by mouth 2 (two) times daily with a meal.       No current facility-administered medications for this visit.     No Known Allergies  Review of Systems  Weight stable No edema No syncope  BP (!) 146/83 (BP Location: Right Arm, Patient Position: Sitting, Cuff Size: Normal)   Pulse (!) 59   Resp 18   Ht 5\' 11"  (1.803 m)   Wt 233 lb 12.8 oz (106.1 kg)   SpO2 98% Comment: RA  BMI 32.61 kg/m  Physical Exam      Exam    General- alert and comfortable    Neck- no JVD, no cervical adenopathy palpable, no carotid bruit   Lungs- clear without rales, wheezes   Cor- regular rate and rhythm, 2/6 systolic flow murmur , no gallop   Abdomen- soft, non-tender   Extremities - warm, non-tender, minimal edema   Neuro- oriented, appropriate, no focal weakness  Diagnostic Tests: None  Impression: Patient recovering well after urgent multivessel CABG  Plan: Continue current medications, do not lift more than 20 pounds until mid October, proceed with cardiac rehab to regain conditioning.  Return as needed.  Len Childs, MD [facility] [phone]

## 2018-07-30 ENCOUNTER — Ambulatory Visit (INDEPENDENT_AMBULATORY_CARE_PROVIDER_SITE_OTHER): Payer: Medicare Other | Admitting: Cardiology

## 2018-07-30 ENCOUNTER — Encounter: Payer: Self-pay | Admitting: Cardiology

## 2018-07-30 VITALS — BP 114/70 | HR 65 | Ht 71.0 in | Wt 230.0 lb

## 2018-07-30 DIAGNOSIS — I359 Nonrheumatic aortic valve disorder, unspecified: Secondary | ICD-10-CM | POA: Diagnosis not present

## 2018-07-30 DIAGNOSIS — I9789 Other postprocedural complications and disorders of the circulatory system, not elsewhere classified: Secondary | ICD-10-CM

## 2018-07-30 DIAGNOSIS — I25119 Atherosclerotic heart disease of native coronary artery with unspecified angina pectoris: Secondary | ICD-10-CM | POA: Diagnosis not present

## 2018-07-30 DIAGNOSIS — E782 Mixed hyperlipidemia: Secondary | ICD-10-CM

## 2018-07-30 DIAGNOSIS — I2510 Atherosclerotic heart disease of native coronary artery without angina pectoris: Secondary | ICD-10-CM | POA: Diagnosis not present

## 2018-07-30 DIAGNOSIS — I4891 Unspecified atrial fibrillation: Secondary | ICD-10-CM

## 2018-07-30 NOTE — Patient Instructions (Signed)
Your physician recommends that you schedule a follow-up appointment in:  3 months with [place]     Your physician recommends that you continue on your current medications as directed. Please refer to the Current Medication list given to you today.    If you need a refill on your cardiac medications before your next appointment, please call your pharmacy.     No labs or tests today       Thank you for choosing  !

## 2018-07-30 NOTE — Progress Notes (Signed)
Cardiology Office Note  Date: 07/30/2018   ID: Alejandro, [name] 1941/06/08, MRN [id number]  PCP: Celene Squibb, MD  Primary Cardiologist: Rozann Lesches, MD   Chief Complaint  Patient presents with  . Coronary Artery Disease    History of Present Illness: Alejandro [name] is a 77 y.o. male last seen by Ms. Dunn PA-C in July for a post hospital visit following NSTEMI and CABG.  I saw him in consultation back in June.  I reviewed extensive interval records.  He is here today with his grandson.  States that he has been doing very well overall, walks about 20 to 30 minutes each morning.  He plans to start cardiac rehabilitation within the next few weeks.  He does not report any angina or unusual shortness of breath with activity.  Patient did have postoperative atrial fibrillation which was managed with amiodarone, this has been discontinued in the interim by Dr. Prescott Gum.  Current lifting restriction is no more than 20 pounds until October.  He is to have a follow-up lipid panel with Dr. Nevada Crane.  Current cardiac medications are outlined below.  He reports no intolerances.  Blood pressure is well controlled today.  Past Medical History:  Diagnosis Date  . Aortic insufficiency    a. mild-mod by TEE 05/2018 (no aortic stenosis).  Marland Kitchen CAD (coronary artery disease)    a. NSTEMI 05/2018 with multivessel disease -> s/p CABGx4 05/11/18.  Marland Kitchen Chronic diastolic CHF (congestive heart failure) ([place])    a. dx 05/2018.  . CKD (chronic kidney disease), stage III ([place])   . History of nephrolithiasis 2004  . Hyperlipidemia   . Hypertension   . IBS (irritable colon syndrome)   . Postoperative atrial fibrillation ([place])   . PVC's (premature ventricular contractions)   . Reflux   . Type 2 diabetes mellitus ([place])     Past Surgical History:  Procedure Laterality Date  . CORONARY ARTERY BYPASS GRAFT N/A 05/11/2018   Procedure: CORONARY ARTERY BYPASS GRAFTING (CABG), on pump, times four, using left  internal mammary artery and endoscopically harvested right greater saphenous leg vein.;  Surgeon: Ivin Poot, MD;  Location: [place];  Service: Open Heart Surgery;  Laterality: N/A;  . ESOPHAGOGASTRODUODENOSCOPY  02/04/2012   Procedure: ESOPHAGOGASTRODUODENOSCOPY (EGD);  Surgeon: Rogene Houston, MD;  [facility];  Service: Endoscopy;  Laterality: N/A;  100  . LEFT HEART CATH AND CORONARY ANGIOGRAPHY N/A 05/04/2018   Procedure: LEFT HEART CATH AND CORONARY ANGIOGRAPHY;  Surgeon: Belva Crome, MD;  Location: [facility];  Service: Cardiovascular;  Laterality: N/A;  . NASAL FRACTURE SURGERY    . TEE WITHOUT CARDIOVERSION N/A 05/11/2018   Procedure: TRANSESOPHAGEAL ECHOCARDIOGRAM (TEE);  Surgeon: Prescott Gum, Collier Salina, MD;  Location: [place] [facility];  Service: Open Heart Surgery;  Laterality: N/A;    Current Outpatient Medications  Medication Sig Dispense Refill  . ALPRAZolam (XANAX) 0.5 MG tablet Take 0.5 mg by mouth at bedtime as needed for sleep.    Marland Kitchen aspirin 325 MG EC tablet Take 1 tablet (325 mg total) by mouth daily. 30 tablet 0  . atorvastatin (LIPITOR) 80 MG tablet TAKE 1 TABLET BY MOUTH EVERY DAY AT 6 PM 30 tablet 6  . DEXILANT 60 MG capsule TAKE ONE CAPSULE BY MOUTH EVERY DAY. 30 capsule 5  . hydrALAZINE (APRESOLINE) 25 MG tablet Take 1 tablet (25 mg total) by mouth every 8 (eight) hours. 90 tablet 1  . metoprolol tartrate (LOPRESSOR)  50 MG tablet TAKE 1 TABLET BY MOUTH TWICE DAILY 180 tablet 2  . sitaGLIPtan-metformin (JANUMET) 50-1000 MG per tablet Take 1 tablet by mouth 2 (two) times daily with a meal.       No current facility-administered medications for this visit.    Allergies:  Patient has no known allergies.   Social History: The patient  reports that he has never smoked. He has never used smokeless tobacco. He reports that he does not drink alcohol or use drugs.   ROS:  Please see the history of present illness. Otherwise, complete review of systems is positive for  none.  All other systems are reviewed and negative.   Physical Exam: VS:  BP 114/70   Pulse 65   Ht 5\' 11"  (1.803 m)   Wt 230 lb (104.3 kg)   SpO2 96%   BMI 32.08 kg/m , BMI Body mass index is 32.08 kg/m.  Wt Readings from Last 3 Encounters:  07/30/18 230 lb (104.3 kg)  07/28/18 233 lb 12.8 oz (106.1 kg)  06/28/18 224 lb (101.6 kg)    General: Overweight elderly male, appears comfortable at rest. HEENT: Conjunctiva and lids normal, oropharynx clear. Neck: Supple, no elevated JVP or carotid bruits, no thyromegaly. Lungs: Clear to auscultation, nonlabored breathing at rest. Thorax: Well-healed sternal incision. Cardiac: Regular rate and rhythm, no S3, soft systolic murmur. Abdomen: Soft, nontender, bowel sounds present. Extremities: Trace ankle edema, distal pulses 2+. Skin: Warm and dry. Musculoskeletal: No kyphosis. Neuropsychiatric: Alert and oriented x3, affect grossly appropriate.  ECG: I personally reviewed the tracing from 12/12/2017 which showed sinus bradycardia with nonspecific ST-T changes.  Recent Labwork: 05/02/2018: B Natriuretic Peptide 632.0 05/10/2018: ALT 22; AST 19 05/12/2018: Magnesium 2.2 06/11/2018: BUN 24; Creatinine, Ser 1.49; Hemoglobin 12.9; Platelets 254; Potassium 4.7; Sodium 133; TSH 4.103   Other Studies Reviewed Today:  Intraoperative TEE 05/11/2018:  Aortic valve: The valve is trileaflet. Mild valve thickening present. Mild valve calcification present. Mildly decreased leaflet separation. No stenosis. Mild to moderate regurgitation. No AV vegetation.  Mitral valve: No leaflet thickening and calcification present. Mild mitral annular calcification. Trace regurgitation.  Right ventricle: Normal cavity size, wall thickness and ejection fraction. No thrombus present. No mass present.  Echocardiogram 05/03/2018: Study Conclusions  - Left ventricle: The cavity size was moderately dilated. Wall   thickness was increased in a pattern of mild LVH.  Systolic   function was normal. The estimated ejection fraction was in the   range of 50% to 55%. There is mild hypokinesis of the   basal-midinferior myocardium. Features are consistent with a   pseudonormal left ventricular filling pattern, with concomitant   abnormal relaxation and increased filling pressure (grade 2   diastolic dysfunction). - Aortic valve: Mildly to moderately calcified annulus. Trileaflet;   moderately calcified leaflets. Noncoronary cusp mobility was   restricted. There was mild stenosis. There was trivial   regurgitation. Mean gradient (S): 22 mm Hg. Peak gradient (S): 39   mm Hg. VTI ratio of LVOT to aortic valve: 0.42. Valve area (VTI):   2.08 cm^2. Valve area (Vmax): 1.9 cm^2. Valve area (Vmean): 1.86   cm^2. - Mitral valve: Moderately calcified annulus. There was mild   regurgitation. - Left atrium: The atrium was moderately dilated. - Right atrium: Central venous pressure (est): 8 mm Hg. - Atrial septum: No defect or patent foramen ovale was identified. - Tricuspid valve: There was trivial regurgitation. - Pulmonary arteries: Systolic pressure was moderately increased.   PA peak  pressure: 50 mm Hg (S). - Coronary sinus: The vessel was dilated. - Pericardium, extracardiac: There was no pericardial effusion.  Chest x-ray 06/16/2018: FINDINGS: Sequelae of CABG are again identified. The cardiac silhouette is mildly enlarged. There is improved aeration of the lung bases, and the previously seen pleural effusions have resolved. No airspace consolidation, edema, or pneumothorax is identified. An old right fifth rib fracture is noted.  IMPRESSION: Improved lung aeration and resolved pleural effusions. No evidence of acute cardiopulmonary disease.  Assessment and Plan:  1.  Multivessel CAD status post CABG in June with LIMA to the LAD, SVG to the diagonal, SVG to the obtuse marginal, and SVG to the posterior descending.  LVEF low normal range.  He is doing  well postoperatively without angina symptoms and plans to start cardiac rehabilitation within the next few weeks.  2.  Mild aortic stenosis by echocardiogram in June.  Asymptomatic.  Reportedly had mild to moderate aortic regurgitation by intraoperative TEE.  We will continue to follow.  3.  Mixed hyperlipidemia, tolerating Lipitor.  He will follow-up with a fasting lipid panel with Dr. Nevada Crane.  4.  Postoperative atrial fibrillation, now off amiodarone.  No obvious recurrences.  Current medicines were reviewed with the patient today.  Disposition: Follow-up in 3 months.  Signed, Satira Sark, MD, [facility] 07/30/2018 2:15 PM    [facility] [address], [place], [place] [address] Phone: [phone]; Fax: [phone]

## 2018-08-04 ENCOUNTER — Other Ambulatory Visit: Payer: Self-pay

## 2018-08-04 MED ORDER — ATORVASTATIN CALCIUM 80 MG PO TABS: ORAL_TABLET | ORAL | 6 refills | Status: DC

## 2018-08-07 ENCOUNTER — Other Ambulatory Visit: Payer: Self-pay | Admitting: Physician Assistant

## 2018-08-13 ENCOUNTER — Encounter (HOSPITAL_COMMUNITY)
Admission: RE | Admit: 2018-08-13 | Discharge: 2018-08-13 | Disposition: A | Discharge status: Home / Self Care | Payer: Medicare Other | Source: Ambulatory Visit | Attending: Cardiology | Admitting: Cardiology

## 2018-08-13 ENCOUNTER — Encounter (HOSPITAL_COMMUNITY): Payer: Self-pay

## 2018-08-13 VITALS — BP 156/80 | HR 66 | Ht 71.0 in | Wt 230.9 lb

## 2018-08-13 DIAGNOSIS — Z951 Presence of aortocoronary bypass graft: Secondary | ICD-10-CM | POA: Diagnosis not present

## 2018-08-13 DIAGNOSIS — I214 Non-ST elevation (NSTEMI) myocardial infarction: Secondary | ICD-10-CM | POA: Insufficient documentation

## 2018-08-13 NOTE — Progress Notes (Signed)
Cardiac Individual Treatment Plan  Patient Details  Name: Alejandro [name] MRN: [id number] Date of Birth: 1941/01/29 Referring Provider:     [place] from 08/13/2018 in [place]  Referring Provider  [name]      Initial Encounter Date:    CARDIAC REHAB PHASE II ORIENTATION from 08/13/2018 in [place]  Date  08/13/18      Visit Diagnosis: NSTEMI (non-ST elevated myocardial infarction) ([place])  S/P CABG x 4  Patient's Home Medications on Admission:  Current Outpatient Medications:  .  ALPRAZolam (XANAX) 0.5 MG tablet, Take 0.5 mg by mouth at bedtime as needed for sleep., Disp: , Rfl:  .  aspirin 325 MG EC tablet, Take 1 tablet (325 mg total) by mouth daily., Disp: 30 tablet, Rfl: 0 .  atorvastatin (LIPITOR) 80 MG tablet, TAKE 1 TABLET BY MOUTH EVERY DAY AT 6 PM, Disp: 30 tablet, Rfl: 6 .  DEXILANT 60 MG capsule, TAKE ONE CAPSULE BY MOUTH EVERY DAY., Disp: 30 capsule, Rfl: 5 .  hydrALAZINE (APRESOLINE) 25 MG tablet, Take 1 tablet (25 mg total) by mouth every 8 (eight) hours., Disp: 90 tablet, Rfl: 1 .  metoprolol tartrate (LOPRESSOR) 50 MG tablet, TAKE 1 TABLET BY MOUTH TWICE DAILY, Disp: 180 tablet, Rfl: 2 .  sitaGLIPtan-metformin (JANUMET) 50-1000 MG per tablet, Take 1 tablet by mouth 2 (two) times daily with a meal.  , Disp: , Rfl:   Past Medical History: Past Medical History:  Diagnosis Date  . Aortic insufficiency    a. mild-mod by TEE 05/2018 (no aortic stenosis).  Marland Kitchen CAD (coronary artery disease)    a. NSTEMI 05/2018 with multivessel disease -> s/p CABGx4 05/11/18.  Marland Kitchen Chronic diastolic CHF (congestive heart failure) ([place])    a. dx 05/2018.  . CKD (chronic kidney disease), stage III ([place])   . History of nephrolithiasis 2004  . Hyperlipidemia   . Hypertension   . IBS (irritable colon syndrome)   . Postoperative atrial fibrillation ([place])   . PVC's (premature ventricular contractions)   . Reflux   . Type  2 diabetes mellitus ([facility])     Tobacco Use: Social History   Tobacco Use  Smoking Status Never Smoker  Smokeless Tobacco Never Used    Labs: Recent Review Flowsheet Data    Labs for ITP Cardiac and Pulmonary Rehab Latest Ref Rng & Units 05/11/2018 05/11/2018 05/12/2018 05/12/2018 05/12/2018   Hemoglobin A1c 4.8 - 5.6 % - - - - -   PHART 7.350 - 7.450 7.326(L) 7.368 7.376 - -   PCO2ART 32.0 - 48.0 mmHg 40.8 38.1 39.5 - -   HCO3 20.0 - 28.0 mmol/L 21.6 21.8 23.1 - -   TCO2 22 - 32 mmol/L 23 23 24 22  21(L)   ACIDBASEDEF 0.0 - 2.0 mmol/L 5.0(H) 3.0(H) 2.0 - -   O2SAT % 97.0 98.0 98.0 - -      Capillary Blood Glucose: Lab Results  Component Value Date   GLUCAP 127 (H) 05/17/2018   GLUCAP 99 05/17/2018   GLUCAP 90 05/16/2018   GLUCAP 83 05/16/2018   GLUCAP 128 (H) 05/16/2018     Exercise Target Goals: Exercise Program Goal: Individual exercise prescription set using results from initial 6 min walk test and THRR while considering  patient's activity barriers and safety.   Exercise Prescription Goal: Starting with aerobic activity 30 plus minutes a day, 3 days per week for initial exercise prescription. Provide home exercise prescription and guidelines that participant  acknowledges understanding prior to discharge.  Activity Barriers & Risk Stratification: Activity Barriers & Cardiac Risk Stratification - 08/13/18 1352      Activity Barriers & Cardiac Risk Stratification   Activity Barriers  Muscular Weakness;Other (comment)    Comments  calf pain     Cardiac Risk Stratification  High       6 Minute Walk: 6 Minute Walk    Row Name 08/13/18 1351         6 Minute Walk   Phase  Initial     Distance  1200 feet     Walk Time  6 minutes     # of Rest Breaks  0     MPH  2.27     METS  2.74     RPE  10     Perceived Dyspnea   9     VO2 Peak  8.68     Symptoms  Yes (comment)     Comments  pain in both calves 6/10, 2/10 after 2 minute rest      Resting HR  66 bpm      Resting BP  156/80     Resting Oxygen Saturation   98 %     Exercise Oxygen Saturation  during 6 min walk  96 %     Max Ex. HR  86 bpm     Max Ex. BP  194/82     2 Minute Post BP  168/78        Oxygen Initial Assessment:   Oxygen Re-Evaluation:   Oxygen Discharge (Final Oxygen Re-Evaluation):   Initial Exercise Prescription: Initial Exercise Prescription - 08/13/18 1400      Date of Initial Exercise RX and Referring Provider   Date  08/13/18    Referring Provider  [name]      NuStep   Level  1    SPM  54    Minutes  17    METs  1.8      Arm Ergometer   Level  1    Watts  12    RPM  55    Minutes  17    METs  1.9      Prescription Details   Frequency (times per week)  3    Duration  Progress to 30 minutes of continuous aerobic without signs/symptoms of physical distress      Intensity   THRR 40-80% of Max Heartrate  97-113-129    Ratings of Perceived Exertion  11-13    Perceived Dyspnea  0-4      Progression   Progression  Continue to progress workloads to maintain intensity without signs/symptoms of physical distress.      Resistance Training   Training Prescription  Yes    Weight  1    Reps  10-15       Perform Capillary Blood Glucose checks as needed.  Exercise Prescription Changes:   Exercise Comments:   Exercise Goals and Review:  Exercise Goals    Row Name 08/13/18 1357             Exercise Goals   Increase Physical Activity  Yes       Intervention  Provide advice, education, support and counseling about physical activity/exercise needs.;Develop an individualized exercise prescription for aerobic and resistive training based on initial evaluation findings, risk stratification, comorbidities and participant's personal goals.       Expected Outcomes  Short Term: Attend rehab on a regular basis to  increase amount of physical activity.       Increase Strength and Stamina  Yes       Intervention  Provide advice, education, support and  counseling about physical activity/exercise needs.;Develop an individualized exercise prescription for aerobic and resistive training based on initial evaluation findings, risk stratification, comorbidities and participant's personal goals.       Expected Outcomes  Short Term: Increase workloads from initial exercise prescription for resistance, speed, and METs.;Short Term: Perform resistance training exercises routinely during rehab and add in resistance training at home;Long Term: Improve cardiorespiratory fitness, muscular endurance and strength as measured by increased METs and functional capacity (6MWT)       Able to understand and use rate of perceived exertion (RPE) scale  Yes       Intervention  Provide education and explanation on how to use RPE scale       Expected Outcomes  Short Term: Able to use RPE daily in rehab to express subjective intensity level;Long Term:  Able to use RPE to guide intensity level when exercising independently       Knowledge and understanding of Target Heart Rate Range (THRR)  Yes       Intervention  Provide education and explanation of THRR including how the numbers were predicted and where they are located for reference       Expected Outcomes  Short Term: Able to state/look up THRR;Long Term: Able to use THRR to govern intensity when exercising independently;Short Term: Able to use daily as guideline for intensity in rehab       Able to check pulse independently  Yes       Intervention  Provide education and demonstration on how to check pulse in carotid and radial arteries.;Review the importance of being able to check your own pulse for safety during independent exercise       Expected Outcomes  Short Term: Able to explain why pulse checking is important during independent exercise;Long Term: Able to check pulse independently and accurately       Understanding of Exercise Prescription  Yes       Intervention  Provide education, explanation, and written materials  on patient's individual exercise prescription       Expected Outcomes  Short Term: Able to explain program exercise prescription;Long Term: Able to explain home exercise prescription to exercise independently          Exercise Goals Re-Evaluation :    Discharge Exercise Prescription (Final Exercise Prescription Changes):   Nutrition:  Target Goals: Understanding of nutrition guidelines, daily intake of sodium 1500mg , cholesterol 200mg , calories 30% from fat and 7% or less from saturated fats, daily to have 5 or more servings of fruits and vegetables.  Biometrics: Pre Biometrics - 08/13/18 1353      Pre Biometrics   Height  5\' 11"  (1.803 m)    Waist Circumference  42 inches    Hip Circumference  38.5 inches    Waist to Hip Ratio  1.09 %    Triceps Skinfold  6 mm    % Body Fat  26.4 %    Grip Strength  25.1 kg    Flexibility  14.3 in    Single Leg Stand  2 seconds        Nutrition Therapy Plan and Nutrition Goals: Nutrition Therapy & Goals - 08/13/18 1514      Personal Nutrition Goals   Personal Goal #2  Patient eats low fat, low sodium    Additional  Goals?  No       Nutrition Assessments: Nutrition Assessments - 08/13/18 1514      MEDFICTS Scores   Pre Score  12       Nutrition Goals Re-Evaluation:   Nutrition Goals Discharge (Final Nutrition Goals Re-Evaluation):   Psychosocial: Target Goals: Acknowledge presence or absence of significant depression and/or stress, maximize coping skills, provide positive support system. Participant is able to verbalize types and ability to use techniques and skills needed for reducing stress and depression.  Initial Review & Psychosocial Screening: Initial Psych Review & Screening - 08/13/18 1512      Initial Review   Current issues with  None Identified      Family Dynamics   Good Support System?  Yes      Barriers   Psychosocial barriers to participate in program  There are no identifiable barriers or  psychosocial needs.      Screening Interventions   Interventions  Encouraged to exercise    Expected Outcomes  Short Term goal: Identification and review with participant of any Quality of Life or Depression concerns found by scoring the questionnaire.;Long Term goal: The participant improves quality of Life and PHQ9 Scores as seen by post scores and/or verbalization of changes       Quality of Life Scores: Quality of Life - 08/13/18 1358      Quality of Life   Select  Quality of Life      Quality of Life Scores   Health/Function Pre  28.68 %    Socioeconomic Pre  30 %    Psych/Spiritual Pre  30 %    Family Pre  19.5 %    GLOBAL Pre  28.44 %      Scores of 19 and below usually indicate a poorer quality of life in these areas.  A difference of  2-3 points is a clinically meaningful difference.  A difference of 2-3 points in the total score of the Quality of Life Index has been associated with significant improvement in overall quality of life, self-image, physical symptoms, and general health in studies assessing change in quality of life.  PHQ-9: Recent Review Flowsheet Data    Depression screen [facility] 2/9 08/13/2018 06/22/2018 05/27/2018   Decreased Interest 0 0 0   Down, Depressed, Hopeless 0 0 1    PHQ - 2 Score 0 0 1   Altered sleeping 2 - -   Tired, decreased energy 0 - -   Change in appetite 0 - -   Feeling bad or failure about yourself  0 - -   Trouble concentrating 0 - -   Moving slowly or fidgety/restless 0 - -   Suicidal thoughts 0 - -   PHQ-9 Score 2 - -   Difficult doing work/chores Somewhat difficult - -     Interpretation of Total Score  Total Score Depression Severity:  1-4 = Minimal depression, 5-9 = Mild depression, 10-14 = Moderate depression, 15-19 = Moderately severe depression, 20-27 = Severe depression   Psychosocial Evaluation and Intervention: Psychosocial Evaluation - 08/13/18 1513      Psychosocial Evaluation & Interventions   Interventions   Encouraged to exercise with the program and follow exercise prescription    Continue Psychosocial Services   No Follow up required       Psychosocial Re-Evaluation:   Psychosocial Discharge (Final Psychosocial Re-Evaluation):   Vocational Rehabilitation: Provide vocational rehab assistance to qualifying candidates.   Vocational Rehab Evaluation & Intervention: Vocational Rehab -  08/13/18 1516      Initial Vocational Rehab Evaluation & Intervention   Assessment shows need for Vocational Rehabilitation  No       Education: Education Goals: Education classes will be provided on a weekly basis, covering required topics. Participant will state understanding/return demonstration of topics presented.  Learning Barriers/Preferences: Learning Barriers/Preferences - 08/13/18 1515      Learning Barriers/Preferences   Learning Barriers  None    Learning Preferences  Audio;Pictoral;Video       Education Topics: Hypertension, Hypertension Reduction -Define heart disease and high blood pressure. Discus how high blood pressure affects the body and ways to reduce high blood pressure.   Exercise and Your Heart -Discuss why it is important to exercise, the FITT principles of exercise, normal and abnormal responses to exercise, and how to exercise safely.   Angina -Discuss definition of angina, causes of angina, treatment of angina, and how to decrease risk of having angina.   Cardiac Medications -Review what the following cardiac medications are used for, how they affect the body, and side effects that may occur when taking the medications.  Medications include Aspirin, Beta blockers, calcium channel blockers, ACE Inhibitors, angiotensin receptor blockers, diuretics, digoxin, and antihyperlipidemics.   Congestive Heart Failure -Discuss the definition of CHF, how to live with CHF, the signs and symptoms of CHF, and how keep track of weight and sodium intake.   Heart Disease and  Intimacy -Discus the effect sexual activity has on the heart, how changes occur during intimacy as we age, and safety during sexual activity.   Smoking Cessation / COPD -Discuss different methods to quit smoking, the health benefits of quitting smoking, and the definition of COPD.   Nutrition I: Fats -Discuss the types of cholesterol, what cholesterol does to the heart, and how cholesterol levels can be controlled.   Nutrition II: Labels -Discuss the different components of food labels and how to read food label   Heart Parts/Heart Disease and PAD -Discuss the anatomy of the heart, the pathway of blood circulation through the heart, and these are affected by heart disease.   Stress I: Signs and Symptoms -Discuss the causes of stress, how stress may lead to anxiety and depression, and ways to limit stress.   Stress II: Relaxation -Discuss different types of relaxation techniques to limit stress.   Warning Signs of Stroke / TIA -Discuss definition of a stroke, what the signs and symptoms are of a stroke, and how to identify when someone is having stroke.   Knowledge Questionnaire Score: Knowledge Questionnaire Score - 08/13/18 1516      Knowledge Questionnaire Score   Pre Score  16/24       Core Components/Risk Factors/Patient Goals at Admission: Personal Goals and Risk Factors at Admission - 08/13/18 1516      Core Components/Risk Factors/Patient Goals on Admission    Weight Management  Weight Maintenance    Personal Goal Other  Yes    Personal Goal  Get stronger    Intervention  Attend CR 3xweek and supplement 2 x week with home exercise    Expected Outcomes  Reach personal goals.       Core Components/Risk Factors/Patient Goals Review:    Core Components/Risk Factors/Patient Goals at Discharge (Final Review):    ITP Comments:   Comments: Patient arrived for 1st visit/orientation/education at 1230. atient was referred to CR by Dr. [name] due to NSTEMI  (I21.4) and CABGx4 (Z95.5). During orientation advised patient on arrival and appointment times what  to wear, what to do before, during and after exercise. Reviewed attendance and class policy. Talked about inclement weather and class consultation policy. Pt is scheduled to return Cardiac Rehab on 08/16/18 at [place]. Pt was advised to come to class 15 minutes before class starts. Patient was also given instructions on meeting with the dietician and attending the Family Structure classes. Discussed RPE/Dpysnea scales. Discussed initial THR and how to find their radial and/or carotid pulse. Discussed the initial exercise prescription and how this effects their progress. Pt is eager to get started. Patient participated in warm up stretches followed by light weights and resistance bands. Patient was able to complete 6 minute walk test. Patient c/o bilateral claudication 6/10. Pain subsided to 2/10 at 2 minute rest. Pain was completely gone at the end of orientation. Patient was measured for the equipment. Discussed equipment safety with patient. Took patient pre-anthropometric measurements. Patient finished visit at 1500.

## 2018-08-13 NOTE — Progress Notes (Signed)
Cardiac/Pulmonary Rehab Medication Review by a Pharmacist  Does the patient  feel that his/her medications are working for him/her?  yes  Has the patient been experiencing any side effects to the medications prescribed?  no  Does the patient measure his/her own blood pressure or blood glucose at home?  no   Does the patient have any problems obtaining medications due to transportation or finances?   no  Understanding of regimen: good Understanding of indications: fair Potential of compliance: excellent  Questions asked to Determine Patient Understanding of Medication Regimen:  1. What is the name of the medication?  2. What is the medication used for?  3. When should it be taken?  4. How much should be taken?  5. How will you take it?  6. What side effects should you report?  Understanding Defined as: Excellent: All questions above are correct Good: Questions 1-4 are correct Fair: Questions 1-2 are correct  Poor: 1 or none of the above questions are correct   Pharmacist comments: Mr Guerrant presented to cardiac rehab today post CABG x 4. I reviewed his medications with him. He is tolerating medications. Unfortunately,  he is not monitoring his Blood pressure and. I recommended that he purchase a BP cuff. He is monitoring his Blood sugar. Says it usually runs around 90-105. No issues identified with regimen. Continue with current regimen.  Thanks for the opportunity to participate in the care of this patient,  Isac Sarna, BS Vena [place], [place] Pharmacist Pager [phone] 08/13/2018 2:01 PM

## 2018-08-13 NOTE — Progress Notes (Signed)
Daily Session Note  Patient Details  Name: Alejandro [name] MRN: [id number] Date of Birth: 04-20-1941 Referring Provider:     [place] from 08/13/2018 in [place]  Referring Provider  [name]      Encounter Date: 08/13/2018  Check In: Session Check In - 08/13/18 1230      Check-In   Supervising physician immediately available to respond to emergencies  See telemetry face sheet for immediately available MD    Location  [facility]    Staff Present  Aundra Dubin, RN, Cory Munch, Exercise Physiologist;Novalynn Branaman, MS, EP, [facility], Exercise Physiologist    Medication changes reported      No    Fall or balance concerns reported     No    Tobacco Cessation  --   Never Smoked   Warm-up and Cool-down  Performed as group-led instruction    Resistance Training Performed  Yes    VAD Patient?  No    PAD/SET Patient?  No      Pain Assessment   Currently in Pain?  No/denies    Pain Score  0-No pain    Multiple Pain Sites  No       Capillary Blood Glucose: No results found for this or any previous visit (from the past 24 hour(s)).    Social History   Tobacco Use  Smoking Status Never Smoker  Smokeless Tobacco Never Used    Goals Met:  Independence with exercise equipment Exercise tolerated well Personal goals reviewed No report of cardiac concerns or symptoms Strength training completed today  Goals Unmet:  Not Applicable  Comments: Check out: 1500   Dr. Kate Sable is Medical Director for [facility] and Pulmonary Rehab.

## 2018-08-16 ENCOUNTER — Encounter (HOSPITAL_COMMUNITY)
Admission: RE | Admit: 2018-08-16 | Discharge: 2018-08-16 | Disposition: A | Discharge status: Home / Self Care | Payer: Medicare Other | Source: Ambulatory Visit | Attending: Cardiology | Admitting: Cardiology

## 2018-08-16 DIAGNOSIS — I214 Non-ST elevation (NSTEMI) myocardial infarction: Secondary | ICD-10-CM | POA: Diagnosis not present

## 2018-08-16 DIAGNOSIS — Z951 Presence of aortocoronary bypass graft: Secondary | ICD-10-CM | POA: Diagnosis not present

## 2018-08-16 LAB — GLUCOSE, CAPILLARY: GLUCOSE-CAPILLARY: 213 mg/dL — AB (ref 70–99)

## 2018-08-16 NOTE — Progress Notes (Signed)
Daily Session Note  Patient Details  Name: Alejandro [name] MRN: [id number] Date of Birth: May 09, 1941 Referring Provider:     [place] from 08/13/2018 in [place]  Referring Provider  [name]      Encounter Date: 08/16/2018  Check In: Session Check In - 08/16/18 0815      Check-In   Supervising physician immediately available to respond to emergencies  See telemetry face sheet for immediately available MD    Location  [facility]    Staff Present  Russella Dar, MS, EP, [facility], Exercise Physiologist;Jamian Andujo Zachery Conch, Exercise Physiologist    Medication changes reported      No    Fall or balance concerns reported     No    Warm-up and Cool-down  Performed as group-led instruction    Resistance Training Performed  Yes    VAD Patient?  No    PAD/SET Patient?  No      Pain Assessment   Currently in Pain?  No/denies    Pain Score  0-No pain    Multiple Pain Sites  No       Capillary Blood Glucose: No results found for this or any previous visit (from the past 24 hour(s)).    Social History   Tobacco Use  Smoking Status Never Smoker  Smokeless Tobacco Never Used    Goals Met:  Exercise tolerated well Personal goals reviewed No report of cardiac concerns or symptoms Strength training completed today  Goals Unmet:  Not Applicable  Comments: Pt able to follow exercise prescription today without complaint.  Will continue to monitor for progression. Check ou t9:15.   Dr. Kate Sable is Medical Director for [facility] Cardiac and Pulmonary Rehab.

## 2018-08-18 ENCOUNTER — Encounter (HOSPITAL_COMMUNITY)
Admission: RE | Admit: 2018-08-18 | Discharge: 2018-08-18 | Disposition: A | Discharge status: Home / Self Care | Payer: Medicare Other | Source: Ambulatory Visit | Attending: Cardiology | Admitting: Cardiology

## 2018-08-18 DIAGNOSIS — I214 Non-ST elevation (NSTEMI) myocardial infarction: Secondary | ICD-10-CM | POA: Diagnosis not present

## 2018-08-18 DIAGNOSIS — Z951 Presence of aortocoronary bypass graft: Secondary | ICD-10-CM

## 2018-08-18 LAB — GLUCOSE, CAPILLARY: GLUCOSE-CAPILLARY: 231 mg/dL — AB (ref 70–99)

## 2018-08-18 NOTE — Progress Notes (Signed)
Daily Session Note  Patient Details  Name: Alejandro [name] MRN: [id number] Date of Birth: Jul 06, 1941 Referring Provider:     [place] from 08/13/2018 in [place]  Referring Provider  [name]      Encounter Date: 08/18/2018  Check In: Session Check In - 08/18/18 0815      Check-In   Supervising physician immediately available to respond to emergencies  See telemetry face sheet for immediately available MD    Location  [facility]    Staff Present  Russella Dar, MS, EP, [facility], Exercise Physiologist;Obdulio Mash Zachery Conch, Exercise Physiologist;Debra Wynetta Emery, RN, BSN    Medication changes reported      No    Fall or balance concerns reported     No    Warm-up and Cool-down  Performed as group-led instruction    Resistance Training Performed  Yes    VAD Patient?  No    PAD/SET Patient?  No      Pain Assessment   Currently in Pain?  No/denies    Pain Score  0-No pain    Multiple Pain Sites  No       Capillary Blood Glucose: No results found for this or any previous visit (from the past 24 hour(s)).    Social History   Tobacco Use  Smoking Status Never Smoker  Smokeless Tobacco Never Used    Goals Met:  Independence with exercise equipment Exercise tolerated well No report of cardiac concerns or symptoms Strength training completed today  Goals Unmet:  Not Applicable  Comments: Pt able to follow exercise prescription today without complaint.  Will continue to monitor for progression. Check out 9:15.    Dr. Kate Sable is Medical Director for [facility] Cardiac and Pulmonary Rehab.

## 2018-08-19 NOTE — Progress Notes (Addendum)
Cardiac Individual Treatment Plan  Patient Details  Name: Alejandro [name] MRN: [id number] Date of Birth: 07-18-1941 Referring Provider:     [place] from 08/13/2018 in [place]  Referring Provider  [name]      Initial Encounter Date:    CARDIAC REHAB PHASE II ORIENTATION from 08/13/2018 in [place]  Date  08/13/18      Visit Diagnosis: NSTEMI (non-ST elevated myocardial infarction) ([place])  S/P CABG x 4  Patient's Home Medications on Admission:  Current Outpatient Medications:  .  ALPRAZolam (XANAX) 0.5 MG tablet, Take 0.5 mg by mouth at bedtime as needed for sleep., Disp: , Rfl:  .  aspirin 325 MG EC tablet, Take 1 tablet (325 mg total) by mouth daily., Disp: 30 tablet, Rfl: 0 .  atorvastatin (LIPITOR) 80 MG tablet, TAKE 1 TABLET BY MOUTH EVERY DAY AT 6 PM, Disp: 30 tablet, Rfl: 6 .  DEXILANT 60 MG capsule, TAKE ONE CAPSULE BY MOUTH EVERY DAY., Disp: 30 capsule, Rfl: 5 .  hydrALAZINE (APRESOLINE) 25 MG tablet, Take 1 tablet (25 mg total) by mouth every 8 (eight) hours., Disp: 90 tablet, Rfl: 1 .  metoprolol tartrate (LOPRESSOR) 50 MG tablet, TAKE 1 TABLET BY MOUTH TWICE DAILY, Disp: 180 tablet, Rfl: 2 .  sitaGLIPtan-metformin (JANUMET) 50-1000 MG per tablet, Take 1 tablet by mouth 2 (two) times daily with a meal.  , Disp: , Rfl:   Past Medical History: Past Medical History:  Diagnosis Date  . Aortic insufficiency    a. mild-mod by TEE 05/2018 (no aortic stenosis).  Marland Kitchen CAD (coronary artery disease)    a. NSTEMI 05/2018 with multivessel disease -> s/p CABGx4 05/11/18.  Marland Kitchen Chronic diastolic CHF (congestive heart failure) ([place])    a. dx 05/2018.  . CKD (chronic kidney disease), stage III ([place])   . History of nephrolithiasis 2004  . Hyperlipidemia   . Hypertension   . IBS (irritable colon syndrome)   . Postoperative atrial fibrillation ([place])   . PVC's (premature ventricular contractions)   . Reflux   . Type  2 diabetes mellitus ([facility])     Tobacco Use: Social History   Tobacco Use  Smoking Status Never Smoker  Smokeless Tobacco Never Used    Labs: Recent Review Flowsheet Data    Labs for ITP Cardiac and Pulmonary Rehab Latest Ref Rng & Units 05/11/2018 05/11/2018 05/12/2018 05/12/2018 05/12/2018   Hemoglobin A1c 4.8 - 5.6 % - - - - -   PHART 7.350 - 7.450 7.326(L) 7.368 7.376 - -   PCO2ART 32.0 - 48.0 mmHg 40.8 38.1 39.5 - -   HCO3 20.0 - 28.0 mmol/L 21.6 21.8 23.1 - -   TCO2 22 - 32 mmol/L 23 23 24 22  21(L)   ACIDBASEDEF 0.0 - 2.0 mmol/L 5.0(H) 3.0(H) 2.0 - -   O2SAT % 97.0 98.0 98.0 - -      Capillary Blood Glucose: Lab Results  Component Value Date   GLUCAP 231 (H) 08/18/2018   GLUCAP 213 (H) 08/16/2018   GLUCAP 127 (H) 05/17/2018   GLUCAP 99 05/17/2018   GLUCAP 90 05/16/2018     Exercise Target Goals: Exercise Program Goal: Individual exercise prescription set using results from initial 6 min walk test and THRR while considering  patient's activity barriers and safety.   Exercise Prescription Goal: Starting with aerobic activity 30 plus minutes a day, 3 days per week for initial exercise prescription. Provide home exercise prescription and guidelines that  participant acknowledges understanding prior to discharge.  Activity Barriers & Risk Stratification: Activity Barriers & Cardiac Risk Stratification - 08/13/18 1352      Activity Barriers & Cardiac Risk Stratification   Activity Barriers  Muscular Weakness;Other (comment)    Comments  calf pain     Cardiac Risk Stratification  High       6 Minute Walk: 6 Minute Walk    Row Name 08/13/18 1351         6 Minute Walk   Phase  Initial     Distance  1200 feet     Walk Time  6 minutes     # of Rest Breaks  0     MPH  2.27     METS  2.74     RPE  10     Perceived Dyspnea   9     VO2 Peak  8.68     Symptoms  Yes (comment)     Comments  pain in both calves 6/10, 2/10 after 2 minute rest      Resting HR  66 bpm       Resting BP  156/80     Resting Oxygen Saturation   98 %     Exercise Oxygen Saturation  during 6 min walk  96 %     Max Ex. HR  86 bpm     Max Ex. BP  194/82     2 Minute Post BP  168/78        Oxygen Initial Assessment:   Oxygen Re-Evaluation:   Oxygen Discharge (Final Oxygen Re-Evaluation):   Initial Exercise Prescription: Initial Exercise Prescription - 08/13/18 1400      Date of Initial Exercise RX and Referring Provider   Date  08/13/18    Referring Provider  [name]      NuStep   Level  1    SPM  54    Minutes  17    METs  1.8      Arm Ergometer   Level  1    Watts  12    RPM  55    Minutes  17    METs  1.9      Prescription Details   Frequency (times per week)  3    Duration  Progress to 30 minutes of continuous aerobic without signs/symptoms of physical distress      Intensity   THRR 40-80% of Max Heartrate  97-113-129    Ratings of Perceived Exertion  11-13    Perceived Dyspnea  0-4      Progression   Progression  Continue to progress workloads to maintain intensity without signs/symptoms of physical distress.      Resistance Training   Training Prescription  Yes    Weight  1    Reps  10-15       Perform Capillary Blood Glucose checks as needed.  Exercise Prescription Changes:   Exercise Comments:  Exercise Comments    Row Name 08/16/18 1624           Exercise Comments  Patient wants to get stronger.           Exercise Goals and Review:  Exercise Goals    Row Name 08/13/18 1357             Exercise Goals   Increase Physical Activity  Yes       Intervention  Provide advice, education, support and counseling about physical activity/exercise needs.;Develop an  individualized exercise prescription for aerobic and resistive training based on initial evaluation findings, risk stratification, comorbidities and participant's personal goals.       Expected Outcomes  Short Term: Attend rehab on a regular basis to increase amount  of physical activity.       Increase Strength and Stamina  Yes       Intervention  Provide advice, education, support and counseling about physical activity/exercise needs.;Develop an individualized exercise prescription for aerobic and resistive training based on initial evaluation findings, risk stratification, comorbidities and participant's personal goals.       Expected Outcomes  Short Term: Increase workloads from initial exercise prescription for resistance, speed, and METs.;Short Term: Perform resistance training exercises routinely during rehab and add in resistance training at home;Long Term: Improve cardiorespiratory fitness, muscular endurance and strength as measured by increased METs and functional capacity (6MWT)       Able to understand and use rate of perceived exertion (RPE) scale  Yes       Intervention  Provide education and explanation on how to use RPE scale       Expected Outcomes  Short Term: Able to use RPE daily in rehab to express subjective intensity level;Long Term:  Able to use RPE to guide intensity level when exercising independently       Knowledge and understanding of Target Heart Rate Range (THRR)  Yes       Intervention  Provide education and explanation of THRR including how the numbers were predicted and where they are located for reference       Expected Outcomes  Short Term: Able to state/look up THRR;Long Term: Able to use THRR to govern intensity when exercising independently;Short Term: Able to use daily as guideline for intensity in rehab       Able to check pulse independently  Yes       Intervention  Provide education and demonstration on how to check pulse in carotid and radial arteries.;Review the importance of being able to check your own pulse for safety during independent exercise       Expected Outcomes  Short Term: Able to explain why pulse checking is important during independent exercise;Long Term: Able to check pulse independently and accurately        Understanding of Exercise Prescription  Yes       Intervention  Provide education, explanation, and written materials on patient's individual exercise prescription       Expected Outcomes  Short Term: Able to explain program exercise prescription;Long Term: Able to explain home exercise prescription to exercise independently          Exercise Goals Re-Evaluation : Exercise Goals Re-Evaluation    Row Name 08/16/18 1623             Exercise Goal Re-Evaluation   Exercise Goals Review  Increase Physical Activity;Increase Strength and Stamina;Able to understand and use rate of perceived exertion (RPE) scale;Knowledge and understanding of Target Heart Rate Range (THRR);Able to check pulse independently;Understanding of Exercise Prescription       Comments  Patient just started program. has completed 2 visits. Will continue to monitor progress.        Expected Outcomes  Gain strength           Discharge Exercise Prescription (Final Exercise Prescription Changes):   Nutrition:  Target Goals: Understanding of nutrition guidelines, daily intake of sodium 1500mg , cholesterol 200mg , calories 30% from fat and 7% or less from saturated fats, daily to have 5  or more servings of fruits and vegetables.  Biometrics: Pre Biometrics - 08/13/18 1353      Pre Biometrics   Height  5\' 11"  (1.803 m)    Waist Circumference  42 inches    Hip Circumference  38.5 inches    Waist to Hip Ratio  1.09 %    Triceps Skinfold  6 mm    % Body Fat  26.4 %    Grip Strength  25.1 kg    Flexibility  14.3 in    Single Leg Stand  2 seconds        Nutrition Therapy Plan and Nutrition Goals: Nutrition Therapy & Goals - 08/13/18 1514      Personal Nutrition Goals   Personal Goal #2  Patient eats low fat, low sodium    Additional Goals?  No       Nutrition Assessments: Nutrition Assessments - 08/13/18 1514      MEDFICTS Scores   Pre Score  12       Nutrition Goals Re-Evaluation:   Nutrition  Goals Discharge (Final Nutrition Goals Re-Evaluation):   Psychosocial: Target Goals: Acknowledge presence or absence of significant depression and/or stress, maximize coping skills, provide positive support system. Participant is able to verbalize types and ability to use techniques and skills needed for reducing stress and depression.  Initial Review & Psychosocial Screening: Initial Psych Review & Screening - 08/13/18 1512      Initial Review   Current issues with  None Identified      Family Dynamics   Good Support System?  Yes      Barriers   Psychosocial barriers to participate in program  There are no identifiable barriers or psychosocial needs.      Screening Interventions   Interventions  Encouraged to exercise    Expected Outcomes  Short Term goal: Identification and review with participant of any Quality of Life or Depression concerns found by scoring the questionnaire.;Long Term goal: The participant improves quality of Life and PHQ9 Scores as seen by post scores and/or verbalization of changes       Quality of Life Scores: Quality of Life - 08/13/18 1358      Quality of Life   Select  Quality of Life      Quality of Life Scores   Health/Function Pre  28.68 %    Socioeconomic Pre  30 %    Psych/Spiritual Pre  30 %    Family Pre  19.5 %    GLOBAL Pre  28.44 %      Scores of 19 and below usually indicate a poorer quality of life in these areas.  A difference of  2-3 points is a clinically meaningful difference.  A difference of 2-3 points in the total score of the Quality of Life Index has been associated with significant improvement in overall quality of life, self-image, physical symptoms, and general health in studies assessing change in quality of life.  PHQ-9: Recent Review Flowsheet Data    Depression screen [facility] 2/9 08/13/2018 06/22/2018 05/27/2018   Decreased Interest 0 0 0   Down, Depressed, Hopeless 0 0 1    PHQ - 2 Score 0 0 1   Altered sleeping 2 - -    Tired, decreased energy 0 - -   Change in appetite 0 - -   Feeling bad or failure about yourself  0 - -   Trouble concentrating 0 - -   Moving slowly or fidgety/restless 0 - -  Suicidal thoughts 0 - -   PHQ-9 Score 2 - -   Difficult doing work/chores Somewhat difficult - -     Interpretation of Total Score  Total Score Depression Severity:  1-4 = Minimal depression, 5-9 = Mild depression, 10-14 = Moderate depression, 15-19 = Moderately severe depression, 20-27 = Severe depression   Psychosocial Evaluation and Intervention: Psychosocial Evaluation - 08/13/18 1513      Psychosocial Evaluation & Interventions   Interventions  Encouraged to exercise with the program and follow exercise prescription    Continue Psychosocial Services   No Follow up required       Psychosocial Re-Evaluation:   Psychosocial Discharge (Final Psychosocial Re-Evaluation):   Vocational Rehabilitation: Provide vocational rehab assistance to qualifying candidates.   Vocational Rehab Evaluation & Intervention: Vocational Rehab - 08/13/18 1516      Initial Vocational Rehab Evaluation & Intervention   Assessment shows need for Vocational Rehabilitation  No       Education: Education Goals: Education classes will be provided on a weekly basis, covering required topics. Participant will state understanding/return demonstration of topics presented.  Learning Barriers/Preferences: Learning Barriers/Preferences - 08/13/18 1515      Learning Barriers/Preferences   Learning Barriers  None    Learning Preferences  Audio;Pictoral;Video       Education Topics: Hypertension, Hypertension Reduction -Define heart disease and high blood pressure. Discus how high blood pressure affects the body and ways to reduce high blood pressure.   Exercise and Your Heart -Discuss why it is important to exercise, the FITT principles of exercise, normal and abnormal responses to exercise, and how to exercise  safely.   Angina -Discuss definition of angina, causes of angina, treatment of angina, and how to decrease risk of having angina.   Cardiac Medications -Review what the following cardiac medications are used for, how they affect the body, and side effects that may occur when taking the medications.  Medications include Aspirin, Beta blockers, calcium channel blockers, ACE Inhibitors, angiotensin receptor blockers, diuretics, digoxin, and antihyperlipidemics.   Congestive Heart Failure -Discuss the definition of CHF, how to live with CHF, the signs and symptoms of CHF, and how keep track of weight and sodium intake.   Heart Disease and Intimacy -Discus the effect sexual activity has on the heart, how changes occur during intimacy as we age, and safety during sexual activity.   Smoking Cessation / COPD -Discuss different methods to quit smoking, the health benefits of quitting smoking, and the definition of COPD.   CARDIAC REHAB PHASE II EXERCISE from 08/18/2018 in [place]  Date  08/18/18  Educator  DWynetta Emery  Instruction Review Code  2- Demonstrated Understanding      Nutrition I: Fats -Discuss the types of cholesterol, what cholesterol does to the heart, and how cholesterol levels can be controlled.   Nutrition II: Labels -Discuss the different components of food labels and how to read food label   Heart Parts/Heart Disease and PAD -Discuss the anatomy of the heart, the pathway of blood circulation through the heart, and these are affected by heart disease.   Stress I: Signs and Symptoms -Discuss the causes of stress, how stress may lead to anxiety and depression, and ways to limit stress.   Stress II: Relaxation -Discuss different types of relaxation techniques to limit stress.   Warning Signs of Stroke / TIA -Discuss definition of a stroke, what the signs and symptoms are of a stroke, and how to identify when someone is  having  stroke.   Knowledge Questionnaire Score: Knowledge Questionnaire Score - 08/13/18 1516      Knowledge Questionnaire Score   Pre Score  16/24       Core Components/Risk Factors/Patient Goals at Admission: Personal Goals and Risk Factors at Admission - 08/13/18 1516      Core Components/Risk Factors/Patient Goals on Admission    Weight Management  Weight Maintenance    Personal Goal Other  Yes    Personal Goal  Get stronger    Intervention  Attend CR 3xweek and supplement 2 x week with home exercise    Expected Outcomes  Reach personal goals.       Core Components/Risk Factors/Patient Goals Review:    Core Components/Risk Factors/Patient Goals at Discharge (Final Review):    ITP Comments: ITP Comments    Row Name 08/19/18 1019           ITP Comments  Patient new to program completing 3 sessions. Will continue to monitor for progress.           Comments: ITP REVIEW Patient new to program completing 3 sessions. Will continue to monitor for progress.

## 2018-08-20 ENCOUNTER — Encounter (HOSPITAL_COMMUNITY): Payer: Medicare Other

## 2018-08-23 ENCOUNTER — Encounter (HOSPITAL_COMMUNITY)
Admission: RE | Admit: 2018-08-23 | Discharge: 2018-08-23 | Disposition: A | Discharge status: Home / Self Care | Payer: Medicare Other | Source: Ambulatory Visit | Attending: Cardiology | Admitting: Cardiology

## 2018-08-23 DIAGNOSIS — Z951 Presence of aortocoronary bypass graft: Secondary | ICD-10-CM

## 2018-08-23 DIAGNOSIS — I214 Non-ST elevation (NSTEMI) myocardial infarction: Secondary | ICD-10-CM

## 2018-08-23 DIAGNOSIS — Z23 Encounter for immunization: Secondary | ICD-10-CM | POA: Diagnosis not present

## 2018-08-23 LAB — GLUCOSE, CAPILLARY: Glucose-Capillary: 181 mg/dL — ABNORMAL HIGH (ref 70–99)

## 2018-08-23 NOTE — Progress Notes (Signed)
Daily Session Note  Patient Details  Name: Alejandro [name] MRN: [id number] Date of Birth: November 25, 1941 Referring Provider:     [place] from 08/13/2018 in [place]  Referring Provider  [name]      Encounter Date: 08/23/2018  Check In: Session Check In - 08/23/18 0815      Check-In   Supervising physician immediately available to respond to emergencies  See telemetry face sheet for immediately available MD    Location  [facility]    Staff Present  Russella Dar, MS, EP, [facility], Exercise Physiologist;Lorence Nagengast Zachery Conch, Exercise Physiologist;Debra Wynetta Emery, RN, BSN    Medication changes reported      No    Fall or balance concerns reported     No    Warm-up and Cool-down  Performed as group-led instruction    Resistance Training Performed  Yes    VAD Patient?  No    PAD/SET Patient?  No      Pain Assessment   Currently in Pain?  No/denies    Pain Score  0-No pain    Multiple Pain Sites  No       Capillary Blood Glucose: Results for orders placed or performed during the hospital encounter of 08/23/18 (from the past 24 hour(s))  Glucose, capillary     Status: Abnormal   Collection Time: 08/23/18  8:09 AM  Result Value Ref Range   Glucose-Capillary 181 (H) 70 - 99 mg/dL   Comment 1 Document in Chart       Social History   Tobacco Use  Smoking Status Never Smoker  Smokeless Tobacco Never Used    Goals Met:  Independence with exercise equipment Exercise tolerated well No report of cardiac concerns or symptoms Strength training completed today  Goals Unmet:  Not Applicable  Comments: Pt able to follow exercise prescription today without complaint.  Will continue to monitor for progression. Check out 9:15.   Dr. Kate Sable is Medical Director for [facility] Cardiac and Pulmonary Rehab.

## 2018-08-25 ENCOUNTER — Encounter (HOSPITAL_COMMUNITY)
Admission: RE | Admit: 2018-08-25 | Discharge: 2018-08-25 | Disposition: A | Discharge status: Home / Self Care | Payer: Medicare Other | Source: Ambulatory Visit | Attending: Cardiology | Admitting: Cardiology

## 2018-08-25 DIAGNOSIS — Z951 Presence of aortocoronary bypass graft: Secondary | ICD-10-CM

## 2018-08-25 DIAGNOSIS — I214 Non-ST elevation (NSTEMI) myocardial infarction: Secondary | ICD-10-CM

## 2018-08-25 NOTE — Progress Notes (Signed)
Daily Session Note  Patient Details  Name: Alejandro [name] MRN: [id number] Date of Birth: [birth year] Referring Provider:     [place] from 08/13/2018 in [place]  Referring Provider  [name]      Encounter Date: 08/25/2018  Check In: Session Check In - 08/25/18 0815      Check-In   Supervising physician immediately available to respond to emergencies  See telemetry face sheet for immediately available MD    Location  [facility]    Staff Present  Benay Pike, Exercise Physiologist;Debra Wynetta Emery, RN, BSN    Medication changes reported      No    Fall or balance concerns reported     No    Warm-up and Cool-down  Performed as group-led instruction    Resistance Training Performed  Yes    VAD Patient?  No    PAD/SET Patient?  No      Pain Assessment   Currently in Pain?  No/denies    Pain Score  0-No pain    Multiple Pain Sites  No       Capillary Blood Glucose: No results found for this or any previous visit (from the past 24 hour(s)).    Social History   Tobacco Use  Smoking Status Never Smoker  Smokeless Tobacco Never Used    Goals Met:  Exercise tolerated well No report of cardiac concerns or symptoms Strength training completed today  Goals Unmet:  Not Applicable  Comments: Pt able to follow exercise prescription today without complaint.  Will continue to monitor for progression. Check out 9:15.   Dr. Kate Sable is Medical Director for [facility] Cardiac and Pulmonary Rehab.

## 2018-08-26 ENCOUNTER — Ambulatory Visit: Payer: Self-pay

## 2018-08-27 ENCOUNTER — Encounter (HOSPITAL_COMMUNITY)
Admission: RE | Admit: 2018-08-27 | Discharge: 2018-08-27 | Disposition: A | Discharge status: Home / Self Care | Payer: Medicare Other | Source: Ambulatory Visit | Attending: Cardiology | Admitting: Cardiology

## 2018-08-27 ENCOUNTER — Other Ambulatory Visit: Payer: Self-pay

## 2018-08-27 DIAGNOSIS — Z951 Presence of aortocoronary bypass graft: Secondary | ICD-10-CM

## 2018-08-27 DIAGNOSIS — I214 Non-ST elevation (NSTEMI) myocardial infarction: Secondary | ICD-10-CM

## 2018-08-27 NOTE — Patient Outreach (Signed)
[facility] [facility]) Care Management  08/27/2018  GORGE ALMANZA 07-17-1941 [id number]   Successful outreach with Mr. Wacker. He reported feeling very well. He reported increased energy and activity tolerance. Attended Cardiac Rehab earlier today. No complaints of shortness of breath or chest discomfort. Reported compliance with medications, diet recommendations and attends Cardiac Rehab three times a week.  Denied worsening symptoms since last outreach. Discussed plan for discharge from community care management. Member agreeable to follow up assessment and discharge within the next few weeks.   [place] [phone]

## 2018-08-27 NOTE — Progress Notes (Signed)
Daily Session Note  Patient Details  Name: Alejandro [name] MRN: [id number] Date of Birth: 1941/04/21 Referring Provider:     [place] from 08/13/2018 in [place]  Referring Provider  [name]      Encounter Date: 08/27/2018  Check In: Session Check In - 08/27/18 0815      Check-In   Supervising physician immediately available to respond to emergencies  See telemetry face sheet for immediately available MD    Location  [facility]    Staff Present  Benay Pike, Exercise Physiologist;Debra Wynetta Emery, RN, BSN    Medication changes reported      No    Fall or balance concerns reported     No    Warm-up and Cool-down  Performed as group-led instruction    Resistance Training Performed  Yes    VAD Patient?  No    PAD/SET Patient?  No      Pain Assessment   Currently in Pain?  No/denies    Pain Score  0-No pain    Multiple Pain Sites  No       Capillary Blood Glucose: No results found for this or any previous visit (from the past 24 hour(s)).    Social History   Tobacco Use  Smoking Status Never Smoker  Smokeless Tobacco Never Used    Goals Met:  Independence with exercise equipment Exercise tolerated well No report of cardiac concerns or symptoms Strength training completed today  Goals Unmet:  Not Applicable  Comments: Pt able to follow exercise prescription today without complaint.  Will continue to monitor for progression. Check out 9:15.   Dr. Kate Sable is Medical Director for [facility] Cardiac and Pulmonary Rehab.

## 2018-08-30 ENCOUNTER — Encounter (HOSPITAL_COMMUNITY)
Admission: RE | Admit: 2018-08-30 | Discharge: 2018-08-30 | Disposition: A | Discharge status: Home / Self Care | Payer: Medicare Other | Source: Ambulatory Visit | Attending: Cardiology | Admitting: Cardiology

## 2018-08-30 DIAGNOSIS — Z951 Presence of aortocoronary bypass graft: Secondary | ICD-10-CM | POA: Diagnosis not present

## 2018-08-30 DIAGNOSIS — I214 Non-ST elevation (NSTEMI) myocardial infarction: Secondary | ICD-10-CM | POA: Diagnosis not present

## 2018-08-30 LAB — GLUCOSE, CAPILLARY: Glucose-Capillary: 126 mg/dL — ABNORMAL HIGH (ref 70–99)

## 2018-08-30 NOTE — Progress Notes (Signed)
Daily Session Note  Patient Details  Name: Alejandro [name] MRN: [id number] Date of Birth: February 12, 1941 Referring Provider:     [place] from 08/13/2018 in [place]  Referring Provider  [name]      Encounter Date: 08/30/2018  Check In: Session Check In - 08/30/18 0815      Check-In   Supervising physician immediately available to respond to emergencies  See telemetry face sheet for immediately available MD    Location  [facility]    Staff Present  Benay Pike, Exercise Physiologist;Debra Wynetta Emery, RN, BSN    Medication changes reported      No    Fall or balance concerns reported     No    Warm-up and Cool-down  Performed as group-led instruction    Resistance Training Performed  Yes    VAD Patient?  No    PAD/SET Patient?  No      Pain Assessment   Currently in Pain?  No/denies    Pain Score  0-No pain    Multiple Pain Sites  No       Capillary Blood Glucose: No results found for this or any previous visit (from the past 24 hour(s)).    Social History   Tobacco Use  Smoking Status Never Smoker  Smokeless Tobacco Never Used    Goals Met:  Independence with exercise equipment Exercise tolerated well No report of cardiac concerns or symptoms Strength training completed today  Goals Unmet:  Not Applicable  Comments: Pt able to follow exercise prescription today without complaint.  Will continue to monitor for progression. Check out 9:15.   Dr. Kate Sable is Medical Director for [facility] Cardiac and Pulmonary Rehab.

## 2018-09-01 ENCOUNTER — Encounter (HOSPITAL_COMMUNITY)
Admission: RE | Admit: 2018-09-01 | Discharge: 2018-09-01 | Disposition: A | Discharge status: Home / Self Care | Payer: Medicare Other | Source: Ambulatory Visit | Attending: Cardiology | Admitting: Cardiology

## 2018-09-01 DIAGNOSIS — Z951 Presence of aortocoronary bypass graft: Secondary | ICD-10-CM | POA: Diagnosis not present

## 2018-09-01 DIAGNOSIS — I214 Non-ST elevation (NSTEMI) myocardial infarction: Secondary | ICD-10-CM

## 2018-09-01 NOTE — Progress Notes (Signed)
Daily Session Note  Patient Details  Name: MATTIE NOVOSEL MRN: [id number] Date of Birth: May 20, 1941 Referring Provider:     [place] from 08/13/2018 in [place]  Referring Provider  [name]      Encounter Date: 09/01/2018  Check In: Session Check In - 09/01/18 0815      Check-In   Supervising physician immediately available to respond to emergencies  See telemetry face sheet for immediately available MD    Location  [facility]    Staff Present  Benay Pike, Exercise Physiologist;Karolyn Messing Wynetta Emery, RN, BSN    Medication changes reported      No    Fall or balance concerns reported     No    Warm-up and Cool-down  Performed as group-led instruction    Resistance Training Performed  Yes    VAD Patient?  No    PAD/SET Patient?  No      Pain Assessment   Currently in Pain?  No/denies    Pain Score  0-No pain    Multiple Pain Sites  No       Capillary Blood Glucose: No results found for this or any previous visit (from the past 24 hour(s)).    Social History   Tobacco Use  Smoking Status Never Smoker  Smokeless Tobacco Never Used    Goals Met:  Independence with exercise equipment Exercise tolerated well No report of cardiac concerns or symptoms Strength training completed today  Goals Unmet:  Not Applicable  Comments: Pt able to follow exercise prescription today without complaint.  Will continue to monitor for progression. Check out 915.   Dr. Kate Sable is Medical Director for [facility] and [facility].

## 2018-09-02 ENCOUNTER — Encounter (HOSPITAL_COMMUNITY): Payer: Medicare Other

## 2018-09-03 ENCOUNTER — Encounter (HOSPITAL_COMMUNITY)
Admission: RE | Admit: 2018-09-03 | Discharge: 2018-09-03 | Disposition: A | Discharge status: Home / Self Care | Payer: Medicare Other | Source: Ambulatory Visit | Attending: Cardiology | Admitting: Cardiology

## 2018-09-03 DIAGNOSIS — Z951 Presence of aortocoronary bypass graft: Secondary | ICD-10-CM

## 2018-09-03 DIAGNOSIS — I214 Non-ST elevation (NSTEMI) myocardial infarction: Secondary | ICD-10-CM | POA: Diagnosis not present

## 2018-09-03 NOTE — Progress Notes (Signed)
Daily Session Note  Patient Details  Name: Alejandro [name] MRN: [id number] Date of Birth: [birth year] Referring Provider:     [place] from 08/13/2018 in [place]  Referring Provider  Alejandro [name]      Encounter Date: 09/03/2018  Check In: Session Check In - 09/03/18 0815      Check-In   Supervising physician immediately available to respond to emergencies  See telemetry face sheet for immediately available MD    Location  [facility]    Staff Present  Benay Pike, Exercise Physiologist;Debra Wynetta Emery, RN, BSN    Medication changes reported      No    Fall or balance concerns reported     No    Warm-up and Cool-down  Performed as group-led instruction    Resistance Training Performed  Yes    VAD Patient?  No    PAD/SET Patient?  No      Pain Assessment   Currently in Pain?  No/denies    Pain Score  0-No pain    Multiple Pain Sites  No       Capillary Blood Glucose: No results found for this or any previous visit (from the past 24 hour(s)).    Social History   Tobacco Use  Smoking Status Never Smoker  Smokeless Tobacco Never Used    Goals Met:  Independence with exercise equipment Exercise tolerated well No report of cardiac concerns or symptoms Strength training completed today  Goals Unmet:  Not Applicable  Comments: Pt able to follow exercise prescription today without complaint.  Will continue to monitor for progression. Check out 9:15.   Dr. Kate Sable is Medical Director for [facility] Card[facility] and [facility]monary Rehab.

## 2018-09-06 ENCOUNTER — Encounter (HOSPITAL_COMMUNITY)
Admission: RE | Admit: 2018-09-06 | Discharge: 2018-09-06 | Disposition: A | Discharge status: Home / Self Care | Payer: Medicare Other | Source: Ambulatory Visit | Attending: Cardiology | Admitting: Cardiology

## 2018-09-06 DIAGNOSIS — Z951 Presence of aortocoronary bypass graft: Secondary | ICD-10-CM | POA: Diagnosis not present

## 2018-09-06 DIAGNOSIS — I214 Non-ST elevation (NSTEMI) myocardial infarction: Secondary | ICD-10-CM

## 2018-09-06 NOTE — Progress Notes (Signed)
Daily Session Note  Patient Details  Name: Alejandro [name] MRN: [id number] Date of Birth: October 10, 1941 Referring Provider:     [place] from 08/13/2018 in [place]  Referring Provider  [name]      Encounter Date: 09/06/2018  Check In: Session Check In - 09/06/18 0815      Check-In   Supervising physician immediately available to respond to emergencies  See telemetry face sheet for immediately available MD    Location  [facility]    Staff Present  Benay Pike, Exercise Physiologist;Debra Wynetta Emery, RN, BSN    Medication changes reported      No    Fall or balance concerns reported     No    Warm-up and Cool-down  Performed as group-led instruction    Resistance Training Performed  Yes    VAD Patient?  No    PAD/SET Patient?  No      Pain Assessment   Currently in Pain?  No/denies    Pain Score  0-No pain    Multiple Pain Sites  No       Capillary Blood Glucose: No results found for this or any previous visit (from the past 24 hour(s)).    Social History   Tobacco Use  Smoking Status Never Smoker  Smokeless Tobacco Never Used    Goals Met:  Independence with exercise equipment Exercise tolerated well No report of cardiac concerns or symptoms Strength training completed today  Goals Unmet:  Not Applicable  Comments: Pt able to follow exercise prescription today without complaint.  Will continue to monitor for progression. Check out 9:15.   Dr. Kate Sable is Medical Director for [facility] Card[facility] and [facility]monary Rehab.

## 2018-09-06 NOTE — Progress Notes (Signed)
Cardiac Individual Treatment Plan  Patient Details  Name: Alejandro [name] MRN: [id number] Date of Birth: 05/07/1941 Referring Provider:     CARDIAC REHAB PHASE II ORIENTATION from 08/13/2018 in [place] [facility]  Referring Provider  [place]      Initial Encounter Date:    CARDIAC REHAB PHASE II ORIENTATION from 08/13/2018 in [address] CARDIAC REHABILITATION  Date  08/13/18      Visit Diagnosis: NSTEMI (non-ST elevated myocardial infarction) (HCC)  S/P CABG x 4  Patient's Home Medications on Admission:  Current Outpatient Medications:  .  ALPRAZolam (XANAX) 0.5 MG tablet, Take 0.5 mg by mouth at bedtime as needed for sleep., Disp: , Rfl:  .  aspirin 325 MG EC tablet, Take 1 tablet (325 mg total) by mouth daily., Disp: 30 tablet, Rfl: 0 .  atorvastatin (LIPITOR) 80 MG tablet, TAKE 1 TABLET BY MOUTH EVERY DAY AT 6 PM, Disp: 30 tablet, Rfl: 6 .  DEXILANT 60 MG capsule, TAKE ONE CAPSULE BY MOUTH EVERY DAY., Disp: 30 capsule, Rfl: 5 .  hydrALAZINE (APRESOLINE) 25 MG tablet, Take 1 tablet (25 mg total) by mouth every 8 (eight) hours., Disp: 90 tablet, Rfl: 1 .  metoprolol tartrate (LOPRESSOR) 50 MG tablet, TAKE 1 TABLET BY MOUTH TWICE DAILY, Disp: 180 tablet, Rfl: 2 .  sitaGLIPtan-metformin (JANUMET) 50-1000 MG per tablet, Take 1 tablet by mouth 2 (two) times daily with a meal.  , Disp: , Rfl:   Past Medical History: Past Medical History:  Diagnosis Date  . Aortic insufficiency    a. mild-mod by TEE 05/2018 (no aortic stenosis).  . CAD (coronary artery disease)    a. NSTEMI 05/2018 with multivessel disease -> s/p CABGx4 05/11/18.  . Chronic diastolic CHF (congestive heart failure) (HCC)    a. dx 05/2018.  . CKD (chronic kidney disease), stage III (HCC)   . History of nephrolithiasis 2004  . Hyperlipidemia   . Hypertension   . IBS (irritable colon syndrome)   . Postoperative atrial fibrillation (HCC)   . PVC's (premature ventricular contractions)   . Reflux   . Type  2 diabetes mellitus (HCC)     Tobacco Use: Social History   Tobacco Use  Smoking Status Never Smoker  Smokeless Tobacco Never Used    Labs: Recent Review Flowsheet Data    Labs for ITP Cardiac and Pulmonary Rehab Latest Ref Rng & Units 05/11/2018 05/11/2018 05/12/2018 05/12/2018 05/12/2018   Hemoglobin A1c 4.8 - 5.6 % - - - - -   PHART 7.350 - 7.450 7.326(L) 7.368 7.376 - -   PCO2ART 32.0 - 48.0 mmHg 40.8 38.1 39.5 - -   HCO3 20.0 - 28.0 mmol/L 21.6 21.8 23.1 - -   TCO2 22 - 32 mmol/L 23 23 24 22 21(L)   ACIDBASEDEF 0.0 - 2.0 mmol/L 5.0(H) 3.0(H) 2.0 - -   O2SAT % 97.0 98.0 98.0 - -      Capillary Blood Glucose: Lab Results  Component Value Date   GLUCAP 126 (H) 08/30/2018   GLUCAP 181 (H) 08/23/2018   GLUCAP 231 (H) 08/18/2018   GLUCAP 213 (H) 08/16/2018   GLUCAP 127 (H) 05/17/2018     Exercise Target Goals: Exercise Program Goal: Individual exercise prescription set using results from initial 6 min walk test and THRR while considering  patient's activity barriers and safety.   Exercise Prescription Goal: Starting with aerobic activity 30 plus minutes a day, 3 days per week for initial exercise prescription. Provide home exercise prescription and   guidelines that participant acknowledges understanding prior to discharge.  Activity Barriers & Risk Stratification: Activity Barriers & Cardiac Risk Stratification - 08/13/18 1352      Activity Barriers & Cardiac Risk Stratification   Activity Barriers  Muscular Weakness;Other (comment)    Comments  calf pain     Cardiac Risk Stratification  High       6 Minute Walk: 6 Minute Walk    Row Name 08/13/18 1351         6 Minute Walk   Phase  Initial     Distance  1200 feet     Walk Time  6 minutes     # of Rest Breaks  0     MPH  2.27     METS  2.74     RPE  10     Perceived Dyspnea   9     VO2 Peak  8.68     Symptoms  Yes (comment)     Comments  pain in both calves 6/10, 2/10 after 2 minute rest      Resting HR   66 bpm     Resting BP  156/80     Resting Oxygen Saturation   98 %     Exercise Oxygen Saturation  during 6 min walk  96 %     Max Ex. HR  86 bpm     Max Ex. BP  194/82     2 Minute Post BP  168/78        Oxygen Initial Assessment:   Oxygen Re-Evaluation:   Oxygen Discharge (Final Oxygen Re-Evaluation):   Initial Exercise Prescription: Initial Exercise Prescription - 08/13/18 1400      Date of Initial Exercise RX and Referring Provider   Date  08/13/18    Referring Provider  [name]      NuStep   Level  1    SPM  54    Minutes  17    METs  1.8      Arm Ergometer   Level  1    Watts  12    RPM  55    Minutes  17    METs  1.9      Prescription Details   Frequency (times per week)  3    Duration  Progress to 30 minutes of continuous aerobic without signs/symptoms of physical distress      Intensity   THRR 40-80% of Max Heartrate  97-113-129    Ratings of Perceived Exertion  11-13    Perceived Dyspnea  0-4      Progression   Progression  Continue to progress workloads to maintain intensity without signs/symptoms of physical distress.      Resistance Training   Training Prescription  Yes    Weight  1    Reps  10-15       Perform Capillary Blood Glucose checks as needed.  Exercise Prescription Changes:  Exercise Prescription Changes    Row Name 08/30/18 1400             Response to Exercise   Blood Pressure (Admit)  134/70       Blood Pressure (Exercise)  158/66       Blood Pressure (Exit)  138/72       Heart Rate (Admit)  74 bpm       Heart Rate (Exercise)  96 bpm       Heart Rate (Exit)  81 bpm  Rating of Perceived Exertion (Exercise)  12       Comments  First full week of exercise        Duration  Progress to 30 minutes of  aerobic without signs/symptoms of physical distress       Intensity  THRR unchanged         Progression   Progression  Continue to progress workloads to maintain intensity without signs/symptoms of physical  distress.       Average METs  2.2         Resistance Training   Training Prescription  Yes       Weight  2       Reps  10-15         NuStep   Level  1       SPM  86       Minutes  17       METs  2.1         Arm Ergometer   Level  2       Watts  16       RPM  52       Minutes  22       METs  2.3         Home Exercise Plan   Plans to continue exercise at  Home (comment) walking       Frequency  Add 2 additional days to program exercise sessions.       Initial Home Exercises Provided  08/13/18          Exercise Comments:  Exercise Comments    Row Name 08/16/18 1624 09/06/18 1411         Exercise Comments  Patient wants to get stronger.   Patient wants to get stronger and has recently stated he would like to sleep better. He works hard everyday in rehab and reports that it is helping him to be able to do more around the house. He cooked himself dinner for the first time since his surgery!          Exercise Goals and Review:  Exercise Goals    Row Name 08/13/18 1357             Exercise Goals   Increase Physical Activity  Yes       Intervention  Provide advice, education, support and counseling about physical activity/exercise needs.;Develop an individualized exercise prescription for aerobic and resistive training based on initial evaluation findings, risk stratification, comorbidities and participant's personal goals.       Expected Outcomes  Short Term: Attend rehab on a regular basis to increase amount of physical activity.       Increase Strength and Stamina  Yes       Intervention  Provide advice, education, support and counseling about physical activity/exercise needs.;Develop an individualized exercise prescription for aerobic and resistive training based on initial evaluation findings, risk stratification, comorbidities and participant's personal goals.       Expected Outcomes  Short Term: Increase workloads from initial exercise prescription for resistance,  speed, and METs.;Short Term: Perform resistance training exercises routinely during rehab and add in resistance training at home;Long Term: Improve cardiorespiratory fitness, muscular endurance and strength as measured by increased METs and functional capacity (6MWT)       Able to understand and use rate of perceived exertion (RPE) scale  Yes       Intervention  Provide education and explanation on how to use RPE  scale       Expected Outcomes  Short Term: Able to use RPE daily in rehab to express subjective intensity level;Long Term:  Able to use RPE to guide intensity level when exercising independently       Knowledge and understanding of Target Heart Rate Range (THRR)  Yes       Intervention  Provide education and explanation of THRR including how the numbers were predicted and where they are located for reference       Expected Outcomes  Short Term: Able to state/look up THRR;Long Term: Able to use THRR to govern intensity when exercising independently;Short Term: Able to use daily as guideline for intensity in rehab       Able to check pulse independently  Yes       Intervention  Provide education and demonstration on how to check pulse in carotid and radial arteries.;Review the importance of being able to check your own pulse for safety during independent exercise       Expected Outcomes  Short Term: Able to explain why pulse checking is important during independent exercise;Long Term: Able to check pulse independently and accurately       Understanding of Exercise Prescription  Yes       Intervention  Provide education, explanation, and written materials on patient's individual exercise prescription       Expected Outcomes  Short Term: Able to explain program exercise prescription;Long Term: Able to explain home exercise prescription to exercise independently          Exercise Goals Re-Evaluation : Exercise Goals Re-Evaluation    Row Name 08/16/18 1623 09/06/18 1409           Exercise  Goal Re-Evaluation   Exercise Goals Review  Increase Physical Activity;Increase Strength and Stamina;Able to understand and use rate of perceived exertion (RPE) scale;Knowledge and understanding of Target Heart Rate Range (THRR);Able to check pulse independently;Understanding of Exercise Prescription  Increase Physical Activity;Increase Strength and Stamina;Able to understand and use rate of perceived exertion (RPE) scale;Knowledge and understanding of Target Heart Rate Range (THRR);Able to check pulse independently;Understanding of Exercise Prescription      Comments  Patient just started program. has completed 2 visits. Will continue to monitor progress.   Patient has done well in the program. He has stated that he feels stronger and is getting better. He has progressed to level 2 on both pieces of equipment (NuStep and Arm Ergometer). His R shoulder bothers him so he has not progressed on weights. We will continue to monitor his progress.       Expected Outcomes  Gain strength  Gain strength to get back to work.           Discharge Exercise Prescription (Final Exercise Prescription Changes): Exercise Prescription Changes - 08/30/18 1400      Response to Exercise   Blood Pressure (Admit)  134/70    Blood Pressure (Exercise)  158/66    Blood Pressure (Exit)  138/72    Heart Rate (Admit)  74 bpm    Heart Rate (Exercise)  96 bpm    Heart Rate (Exit)  81 bpm    Rating of Perceived Exertion (Exercise)  12    Comments  First full week of exercise     Duration  Progress to 30 minutes of  aerobic without signs/symptoms of physical distress    Intensity  THRR unchanged      Progression   Progression  Continue to progress workloads to maintain  intensity without signs/symptoms of physical distress.    Average METs  2.2      Resistance Training   Training Prescription  Yes    Weight  2    Reps  10-15      NuStep   Level  1    SPM  86    Minutes  17    METs  2.1      Arm Ergometer   Level   2    Watts  16    RPM  52    Minutes  22    METs  2.3      Home Exercise Plan   Plans to continue exercise at  Home (comment)   walking   Frequency  Add 2 additional days to program exercise sessions.    Initial Home Exercises Provided  08/13/18       Nutrition:  Target Goals: Understanding of nutrition guidelines, daily intake of sodium 1500mg , cholesterol 200mg , calories 30% from fat and 7% or less from saturated fats, daily to have 5 or more servings of fruits and vegetables.  Biometrics: Pre Biometrics - 08/13/18 1353      Pre Biometrics   Height  5\' 11"  (1.803 m)    Waist Circumference  42 inches    Hip Circumference  38.5 inches    Waist to Hip Ratio  1.09 %    Triceps Skinfold  6 mm    % Body Fat  26.4 %    Grip Strength  25.1 kg    Flexibility  14.3 in    Single Leg Stand  2 seconds        Nutrition Therapy Plan and Nutrition Goals: Nutrition Therapy & Goals - 09/06/18 1426      Nutrition Therapy   RD appointment deferred  Yes      Personal Nutrition Goals   Additional Goals?  No    Comments  Patient says he is trying to eat less carbs and sweets and heart healthy. Will continue to monitor for progress.        Nutrition Assessments: Nutrition Assessments - 08/13/18 1514      MEDFICTS Scores   Pre Score  12       Nutrition Goals Re-Evaluation:   Nutrition Goals Discharge (Final Nutrition Goals Re-Evaluation):   Psychosocial: Target Goals: Acknowledge presence or absence of significant depression and/or stress, maximize coping skills, provide positive support system. Participant is able to verbalize types and ability to use techniques and skills needed for reducing stress and depression.  Initial Review & Psychosocial Screening: Initial Psych Review & Screening - 08/13/18 1512      Initial Review   Current issues with  None Identified      Family Dynamics   Good Support System?  Yes      Barriers   Psychosocial barriers to participate  in program  There are no identifiable barriers or psychosocial needs.      Screening Interventions   Interventions  Encouraged to exercise    Expected Outcomes  Short Term goal: Identification and review with participant of any Quality of Life or Depression concerns found by scoring the questionnaire.;Long Term goal: The participant improves quality of Life and PHQ9 Scores as seen by post scores and/or verbalization of changes       Quality of Life Scores: Quality of Life - 08/13/18 1358      Quality of Life   Select  Quality of Life  Quality of Life Scores   Health/Function Pre  28.68 %    Socioeconomic Pre  30 %    Psych/Spiritual Pre  30 %    Family Pre  19.5 %    GLOBAL Pre  28.44 %      Scores of 19 and below usually indicate a poorer quality of life in these areas.  A difference of  2-3 points is a clinically meaningful difference.  A difference of 2-3 points in the total score of the Quality of Life Index has been associated with significant improvement in overall quality of life, self-image, physical symptoms, and general health in studies assessing change in quality of life.  PHQ-9: Recent Review Flowsheet Data    Depression screen [facility] 2/9 08/13/2018 06/22/2018 05/27/2018   Decreased Interest 0 0 0   Down, Depressed, Hopeless 0 0 1    PHQ - 2 Score 0 0 1   Altered sleeping 2 - -   Tired, decreased energy 0 - -   Change in appetite 0 - -   Feeling bad or failure about yourself  0 - -   Trouble concentrating 0 - -   Moving slowly or fidgety/restless 0 - -   Suicidal thoughts 0 - -   PHQ-9 Score 2 - -   Difficult doing work/chores Somewhat difficult - -     Interpretation of Total Score  Total Score Depression Severity:  1-4 = Minimal depression, 5-9 = Mild depression, 10-14 = Moderate depression, 15-19 = Moderately severe depression, 20-27 = Severe depression   Psychosocial Evaluation and Intervention: Psychosocial Evaluation - 08/13/18 1513      Psychosocial  Evaluation & Interventions   Interventions  Encouraged to exercise with the program and follow exercise prescription    Continue Psychosocial Services   No Follow up required       Psychosocial Re-Evaluation: Psychosocial Re-Evaluation    [place] Name 09/06/18 1431             Psychosocial Re-Evaluation   Current issues with  Current Sleep Concerns       Comments  Patient initial QOL score was 28.44 and his PHQ-9 score was 2 with no psychosocial issues identified. He says he has difficulty going to sleep at times and hopes this will resolve on his own.        Expected Outcomes  Patient will have no psychosocial issues identified at discharge.        Interventions  Stress management education;Encouraged to attend Cardiac Rehabilitation for the exercise;Relaxation education       Continue Psychosocial Services   No Follow up required          Psychosocial Discharge (Final Psychosocial Re-Evaluation): Psychosocial Re-Evaluation - 09/06/18 1431      Psychosocial Re-Evaluation   Current issues with  Current Sleep Concerns    Comments  Patient initial QOL score was 28.44 and his PHQ-9 score was 2 with no psychosocial issues identified. He says he has difficulty going to sleep at times and hopes this will resolve on his own.     Expected Outcomes  Patient will have no psychosocial issues identified at discharge.     Interventions  Stress management education;Encouraged to attend Cardiac Rehabilitation for the exercise;Relaxation education    Continue Psychosocial Services   No Follow up required       Vocational Rehabilitation: Provide vocational rehab assistance to qualifying candidates.   Vocational Rehab Evaluation & Intervention: Vocational Rehab - 08/13/18 4944  Initial Vocational Rehab Evaluation & Intervention   Assessment shows need for Vocational Rehabilitation  No       Education: Education Goals: Education classes will be provided on a weekly basis, covering  required topics. Participant will state understanding/return demonstration of topics presented.  Learning Barriers/Preferences: Learning Barriers/Preferences - 08/13/18 1515      Learning Barriers/Preferences   Learning Barriers  None    Learning Preferences  Audio;Pictoral;Video       Education Topics: Hypertension, Hypertension Reduction -Define heart disease and high blood pressure. Discus how high blood pressure affects the body and ways to reduce high blood pressure.   Exercise and Your Heart -Discuss why it is important to exercise, the FITT principles of exercise, normal and abnormal responses to exercise, and how to exercise safely.   Angina -Discuss definition of angina, causes of angina, treatment of angina, and how to decrease risk of having angina.   Cardiac Medications -Review what the following cardiac medications are used for, how they affect the body, and side effects that may occur when taking the medications.  Medications include Aspirin, Beta blockers, calcium channel blockers, ACE Inhibitors, angiotensin receptor blockers, diuretics, digoxin, and antihyperlipidemics.   Congestive Heart Failure -Discuss the definition of CHF, how to live with CHF, the signs and symptoms of CHF, and how keep track of weight and sodium intake.   Heart Disease and Intimacy -Discus the effect sexual activity has on the heart, how changes occur during intimacy as we age, and safety during sexual activity.   Smoking Cessation / COPD -Discuss different methods to quit smoking, the health benefits of quitting smoking, and the definition of COPD.   CARDIAC REHAB PHASE II EXERCISE from 09/01/2018 in [place]  Date  08/18/18  Educator  Etheleen Mayhew  Instruction Review Code  2- Demonstrated Understanding      Nutrition I: Fats -Discuss the types of cholesterol, what cholesterol does to the heart, and how cholesterol levels can be controlled.   CARDIAC REHAB  PHASE II EXERCISE from 09/01/2018 in [place]  Date  08/25/18  Educator  D. Coad  Instruction Review Code  2- Demonstrated Understanding      Nutrition II: Labels -Discuss the different components of food labels and how to read food label   [place] from 09/01/2018 in [place]  Date  09/01/18  Educator  Etheleen Mayhew  Instruction Review Code  2- Demonstrated Understanding      Heart Parts/Heart Disease and PAD -Discuss the anatomy of the heart, the pathway of blood circulation through the heart, and these are affected by heart disease.   Stress I: Signs and Symptoms -Discuss the causes of stress, how stress may lead to anxiety and depression, and ways to limit stress.   Stress II: Relaxation -Discuss different types of relaxation techniques to limit stress.   Warning Signs of Stroke / TIA -Discuss definition of a stroke, what the signs and symptoms are of a stroke, and how to identify when someone is having stroke.   Knowledge Questionnaire Score: Knowledge Questionnaire Score - 08/13/18 1516      Knowledge Questionnaire Score   Pre Score  16/24       Core Components/Risk Factors/Patient Goals at Admission: Personal Goals and Risk Factors at Admission - 08/13/18 1516      Core Components/Risk Factors/Patient Goals on Admission    Weight Management  Weight Maintenance    Personal Goal Other  Yes  Personal Goal  Get stronger    Intervention  Attend CR 3xweek and supplement 2 x week with home exercise    Expected Outcomes  Reach personal goals.       Core Components/Risk Factors/Patient Goals Review:  Goals and Risk Factor Review    Row Name 09/06/18 1427             Core Components/Risk Factors/Patient Goals Review   Personal Goals Review  Weight Management/Obesity;Diabetes Gain strength; continue to gain strength.        Review  Patient has completed 10 sessions losing 2 lbs since his  initial visit. He is doing well in the program with progression. He says he feels better overall and does feel stronger. His doctor will not release him to return to work until he complete the program. His reported glucose readings have improved since he started the program. His last Hgb A1C was on 05/30/18 at 6.0. He says this will be repeated this month. He is very anxious to go back to work. He says he gets bored around the house but is now feeling like doing more around the house. He cooked dinner recently for the first time since his event. Will continue to monitor for progress.        Expected Outcomes  Patient will continue to attend sessions and complete the program meeting his personal goals.           Core Components/Risk Factors/Patient Goals at Discharge (Final Review):  Goals and Risk Factor Review - 09/06/18 1427      Core Components/Risk Factors/Patient Goals Review   Personal Goals Review  Weight Management/Obesity;Diabetes   Gain strength; continue to gain strength.    Review  Patient has completed 10 sessions losing 2 lbs since his initial visit. He is doing well in the program with progression. He says he feels better overall and does feel stronger. His doctor will not release him to return to work until he complete the program. His reported glucose readings have improved since he started the program. His last Hgb A1C was on 05/30/18 at 6.0. He says this will be repeated this month. He is very anxious to go back to work. He says he gets bored around the house but is now feeling like doing more around the house. He cooked dinner recently for the first time since his event. Will continue to monitor for progress.     Expected Outcomes  Patient will continue to attend sessions and complete the program meeting his personal goals.        ITP Comments: ITP Comments    Row Name 08/19/18 1019           ITP Comments  Patient new to program completing 3 sessions. Will continue to monitor  for progress.           Comments: ITP REVIEW Patient doing well in the program. Will continue to monitor for progress.

## 2018-09-08 ENCOUNTER — Encounter (HOSPITAL_COMMUNITY)
Admission: RE | Admit: 2018-09-08 | Discharge: 2018-09-08 | Disposition: A | Discharge status: Home / Self Care | Payer: Medicare Other | Source: Ambulatory Visit | Attending: Cardiology | Admitting: Cardiology

## 2018-09-08 DIAGNOSIS — Z951 Presence of aortocoronary bypass graft: Secondary | ICD-10-CM

## 2018-09-08 DIAGNOSIS — I214 Non-ST elevation (NSTEMI) myocardial infarction: Secondary | ICD-10-CM | POA: Diagnosis not present

## 2018-09-08 NOTE — Progress Notes (Signed)
Daily Session Note  Patient Details  Name: ABDIMALIK MAYORQUIN MRN: [id number] Date of Birth: 05-19-1941 Referring Provider:     [place] from 08/13/2018 in [place]  Referring Provider  [name]      Encounter Date: 09/08/2018  Check In: Session Check In - 09/08/18 0815      Check-In   Supervising physician immediately available to respond to emergencies  See telemetry face sheet for immediately available MD    Location  [facility]    Staff Present  Benay Pike, Exercise Physiologist;Debra Wynetta Emery, RN, BSN    Medication changes reported      No    Fall or balance concerns reported     No    Warm-up and Cool-down  Performed as group-led instruction    Resistance Training Performed  Yes    VAD Patient?  No    PAD/SET Patient?  No      Pain Assessment   Currently in Pain?  No/denies    Pain Score  0-No pain    Multiple Pain Sites  No       Capillary Blood Glucose: No results found for this or any previous visit (from the past 24 hour(s)).    Social History   Tobacco Use  Smoking Status Never Smoker  Smokeless Tobacco Never Used    Goals Met:  Independence with exercise equipment Exercise tolerated well No report of cardiac concerns or symptoms Strength training completed today  Goals Unmet:  Not Applicable  Comments: Pt able to follow exercise prescription today without complaint.  Will continue to monitor for progression. Check out 9:15.   Dr. Kate Sable is Medical Director for [facility] Cardiac and Pulmonary Rehab.

## 2018-09-09 ENCOUNTER — Ambulatory Visit: Payer: Self-pay

## 2018-09-09 ENCOUNTER — Other Ambulatory Visit: Payer: Self-pay

## 2018-09-09 NOTE — Patient Outreach (Signed)
[facility] [facility]) Care Management   09/09/2018  Alejandro [name] 28-Feb-1941 [id number]  Alejandro [name] is an 77 y.o. male  Subjective:  Assessment prior to case closure. Member alert and oriented x 3. No complaints of pain or discomfort.   Objective:  BP 138/80 (BP Location: Right Arm, Patient Position: Sitting, Cuff Size: Normal)   Pulse 63   Resp 18   Ht 1.803 m (5' 11" )   Wt 226 lb (102.5 kg)   SpO2 95%   BMI 31.52 kg/m    Review of Systems  Constitutional: Negative.   HENT: Negative.   Eyes: Negative.   Respiratory: Negative.   Cardiovascular: Negative.   Gastrointestinal: Negative.   Musculoskeletal: Negative.   Skin: Negative.   Neurological: Negative.   Psychiatric/Behavioral: Negative.     Physical Exam  Constitutional: He is oriented to person, place, and time. He appears well-developed.  Cardiovascular: Normal rate.  Respiratory: Effort normal and breath sounds normal.  GI: Soft.  Neurological: He is alert and oriented to person, place, and time.  Skin: Skin is warm and dry.  Psychiatric: He has a normal mood and affect. His behavior is normal. Judgment and thought content normal.    Encounter Medications:   Outpatient Encounter Medications as of 09/09/2018  Medication Sig  . ALPRAZolam (XANAX) 0.5 MG tablet Take 0.5 mg by mouth at bedtime as needed for sleep.  Marland Kitchen aspirin 325 MG EC tablet Take 1 tablet (325 mg total) by mouth daily.  Marland Kitchen atorvastatin (LIPITOR) 80 MG tablet TAKE 1 TABLET BY MOUTH EVERY DAY AT 6 PM  . DEXILANT 60 MG capsule TAKE ONE CAPSULE BY MOUTH EVERY DAY.  . hydrALAZINE (APRESOLINE) 25 MG tablet Take 1 tablet (25 mg total) by mouth every 8 (eight) hours.  . metoprolol tartrate (LOPRESSOR) 50 MG tablet TAKE 1 TABLET BY MOUTH TWICE DAILY  . sitaGLIPtan-metformin (JANUMET) 50-1000 MG per tablet Take 1 tablet by mouth 2 (two) times daily with a meal.     No facility-administered encounter medications on file as of 09/09/2018.      Functional Status:   In your present state of health, do you have any difficulty performing the following activities: 06/22/2018 05/02/2018  Hearing? N -  Vision? N -  Difficulty concentrating or making decisions? N -  Walking or climbing stairs? Y -  Comment Difficulty climbing stairs. -  Dressing or bathing? N -  Doing errands, shopping? Tempie Donning  Preparing Food and eating ? N -  Using the Toilet? N -  In the past six months, have you accidently leaked urine? N -  Do you have problems with loss of bowel control? N -  Managing your Medications? Y -  Comment Patient reported that grandson  Secondary school teacher. -  Managing your Finances? N -  Housekeeping or managing your Housekeeping? Y -  Some recent data might be hidden    Fall/Depression Screening:    Fall Risk  09/09/2018 08/13/2018 07/02/2018  Falls in the past year? No No No  Comment - - [place]i Telephone Survey: data to providers prior to load  Risk for fall due to : - (No Data) -  Risk for fall due to: Comment - No falls history -   PHQ 2/9 Scores 09/09/2018 08/13/2018 06/22/2018 05/27/2018  PHQ - 2 Score 0 0 0 1  PHQ- 9 Score - 2 - -  Exception Documentation - (No Data) - -    Assessment:  Home visit complete. Alejandro [name]  has remained compliant with medications, glucose monitoring and treatment recommendations. No Emergency Department (ED) visits or hospitalizations since June. Member has progressed well and met care management goals. Discussed possible care management needs. He ambulates well, performs all ADLs and does not require assistance in the home. Reported that his grandson Erlene Quan is still readily available if needed. No current concerns regarding medication management or affordability. Declined need for outreach calls from [place]. Agreeable to case closure and agreed to notify PCP if health status changes and [facility] outreach is needed.   THN CM Care Plan Problem One     Most Recent Value  Care Plan Problem One   Risk for readmission related to heart failure complications.  Role Documenting the Problem One  Care Management Coordinator  Care Plan for Problem One  Active  THN Long Term Goal   Over the next 60 days patient will not have a hospitalization related to heart failure.  THN Long Term Goal Start Date  05/27/18  THN Long Term Goal Met Date  09/09/18  [facility] CM Short Term Goal #1   Over the next 30 days patient will attend all scheduled follow up appointments.  THN CM Short Term Goal #1 Start Date  05/27/18  [facility] CM Short Term Goal #1 Met Date  06/22/18  THN CM Short Term Goal #2   Over the next 30 days patient will take medications as prescribed.  THN CM Short Term Goal #2 Start Date  05/27/18  [facility] CM Short Term Goal #2 Met Date  06/22/18     PLAN Will complete case closure.  [place] [phone]

## 2018-09-10 ENCOUNTER — Encounter (HOSPITAL_COMMUNITY)
Admission: RE | Admit: 2018-09-10 | Discharge: 2018-09-10 | Disposition: A | Discharge status: Home / Self Care | Payer: Medicare Other | Source: Ambulatory Visit | Attending: Cardiology | Admitting: Cardiology

## 2018-09-10 DIAGNOSIS — I214 Non-ST elevation (NSTEMI) myocardial infarction: Secondary | ICD-10-CM | POA: Diagnosis not present

## 2018-09-10 DIAGNOSIS — Z951 Presence of aortocoronary bypass graft: Secondary | ICD-10-CM

## 2018-09-10 NOTE — Progress Notes (Signed)
Daily Session Note  Patient Details  Name: Alejandro [name] MRN: [id number] Date of Birth: Feb 04, 1941 Referring Provider:     [place] from 08/13/2018 in [place]  Referring Provider  [name]      Encounter Date: 09/10/2018  Check In: Session Check In - 09/10/18 0815      Check-In   Supervising physician immediately available to respond to emergencies  See telemetry face sheet for immediately available MD    Location  [facility]    Staff Present  Benay Pike, Exercise Physiologist;Nekita Pita Wynetta Emery, RN, BSN;Diane Coad, MS, EP, [facility], Exercise Physiologist    Medication changes reported      No    Fall or balance concerns reported     No    Warm-up and Cool-down  Performed as group-led instruction    Resistance Training Performed  Yes    VAD Patient?  No    PAD/SET Patient?  No      Pain Assessment   Currently in Pain?  No/denies    Pain Score  0-No pain    Multiple Pain Sites  No       Capillary Blood Glucose: No results found for this or any previous visit (from the past 24 hour(s)).    Social History   Tobacco Use  Smoking Status Never Smoker  Smokeless Tobacco Never Used    Goals Met:  Independence with exercise equipment Exercise tolerated well No report of cardiac concerns or symptoms Strength training completed today  Goals Unmet:  Not Applicable  Comments: Pt able to follow exercise prescription today without complaint.  Will continue to monitor for progression. Check out 915.   Dr. Kate Sable is Medical Director for [facility] Cardiac and Pulmonary Rehab.

## 2018-09-12 ENCOUNTER — Other Ambulatory Visit: Payer: Self-pay | Admitting: Physician Assistant

## 2018-09-13 ENCOUNTER — Encounter (HOSPITAL_COMMUNITY)
Admission: RE | Admit: 2018-09-13 | Discharge: 2018-09-13 | Disposition: A | Discharge status: Home / Self Care | Payer: Medicare Other | Source: Ambulatory Visit | Attending: Cardiology | Admitting: Cardiology

## 2018-09-13 DIAGNOSIS — Z951 Presence of aortocoronary bypass graft: Secondary | ICD-10-CM | POA: Diagnosis not present

## 2018-09-13 DIAGNOSIS — I214 Non-ST elevation (NSTEMI) myocardial infarction: Secondary | ICD-10-CM | POA: Diagnosis not present

## 2018-09-13 NOTE — Progress Notes (Signed)
Daily Session Note  Patient Details  Name: Alejandro [name] MRN: [id number] Date of Birth: 02-03-1941 Referring Provider:     [place] from 08/13/2018 in [place]  Referring Provider  [name]      Encounter Date: 09/13/2018  Check In: Session Check In - 09/13/18 0815      Check-In   Supervising physician immediately available to respond to emergencies  See telemetry face sheet for immediately available MD    Location  [facility]    Staff Present  Benay Pike, Exercise Physiologist;Azalia Neuberger Wynetta Emery, RN, BSN;Diane Coad, MS, EP, [facility], Exercise Physiologist    Medication changes reported      No    Fall or balance concerns reported     No    Warm-up and Cool-down  Performed as group-led instruction    Resistance Training Performed  Yes    VAD Patient?  No    PAD/SET Patient?  No      Pain Assessment   Currently in Pain?  No/denies    Pain Score  0-No pain    Multiple Pain Sites  No       Capillary Blood Glucose: No results found for this or any previous visit (from the past 24 hour(s)).    Social History   Tobacco Use  Smoking Status Never Smoker  Smokeless Tobacco Never Used    Goals Met:  Independence with exercise equipment Exercise tolerated well No report of cardiac concerns or symptoms Strength training completed today  Goals Unmet:  Not Applicable  Comments: Pt able to follow exercise prescription today without complaint.  Will continue to monitor for progression. Check out 915.   Dr. Kate Sable is Medical Director for [facility] Cardiac and Pulmonary Rehab.

## 2018-09-15 ENCOUNTER — Encounter (HOSPITAL_COMMUNITY)
Admission: RE | Admit: 2018-09-15 | Discharge: 2018-09-15 | Disposition: A | Discharge status: Home / Self Care | Payer: Medicare Other | Source: Ambulatory Visit | Attending: Cardiology | Admitting: Cardiology

## 2018-09-15 DIAGNOSIS — I214 Non-ST elevation (NSTEMI) myocardial infarction: Secondary | ICD-10-CM

## 2018-09-15 DIAGNOSIS — Z951 Presence of aortocoronary bypass graft: Secondary | ICD-10-CM

## 2018-09-15 NOTE — Progress Notes (Signed)
Daily Session Note  Patient Details  Name: Alejandro [name] MRN: [id number] Date of Birth: [birth year] Referring Provider:     [place] from 08/13/2018 in [place]  Referring Provider  [name]      Encounter Date: 09/15/2018  Check In: Session Check In - 09/15/18 0815      Check-In   Supervising physician immediately available to respond to emergencies  See telemetry face sheet for immediately available MD    Location  [facility]    Staff Present  Russella Dar, MS, EP, [facility], Exercise Physiologist;Carlyle Achenbach Zachery Conch, Exercise Physiologist    Medication changes reported      No    Fall or balance concerns reported     No    Warm-up and Cool-down  Performed as group-led instruction    Resistance Training Performed  Yes    VAD Patient?  No    PAD/SET Patient?  No      Pain Assessment   Currently in Pain?  No/denies    Pain Score  0-No pain    Multiple Pain Sites  No       Capillary Blood Glucose: No results found for this or any previous visit (from the past 24 hour(s)).    Social History   Tobacco Use  Smoking Status Never Smoker  Smokeless Tobacco Never Used    Goals Met:  Independence with exercise equipment Exercise tolerated well No report of cardiac concerns or symptoms Strength training completed today  Goals Unmet:  Not Applicable  Comments: Pt able to follow exercise prescription today without complaint.  Will continue to monitor for progression. Check out 9:15.   Dr. Kate Sable is Medical Director for [facility] Cardiac and Pulmonary Rehab.

## 2018-09-17 ENCOUNTER — Encounter (HOSPITAL_COMMUNITY)
Admission: RE | Admit: 2018-09-17 | Discharge: 2018-09-17 | Disposition: A | Discharge status: Home / Self Care | Payer: Medicare Other | Source: Ambulatory Visit | Attending: Cardiology | Admitting: Cardiology

## 2018-09-17 DIAGNOSIS — Z6832 Body mass index (BMI) 32.0-32.9, adult: Secondary | ICD-10-CM | POA: Diagnosis not present

## 2018-09-17 DIAGNOSIS — Z951 Presence of aortocoronary bypass graft: Secondary | ICD-10-CM

## 2018-09-17 DIAGNOSIS — I214 Non-ST elevation (NSTEMI) myocardial infarction: Secondary | ICD-10-CM

## 2018-09-17 DIAGNOSIS — G4700 Insomnia, unspecified: Secondary | ICD-10-CM | POA: Diagnosis not present

## 2018-09-17 DIAGNOSIS — Z0000 Encounter for general adult medical examination without abnormal findings: Secondary | ICD-10-CM | POA: Diagnosis not present

## 2018-09-17 NOTE — Progress Notes (Signed)
Daily Session Note  Patient Details  Name: SEENA FACE MRN: [id number] Date of Birth: 1941-10-26 Referring Provider:     [place] from 08/13/2018 in [place]  Referring Provider  [name]      Encounter Date: 09/17/2018  Check In: Session Check In - 09/17/18 0815      Check-In   Supervising physician immediately available to respond to emergencies  See telemetry face sheet for immediately available MD    Location  [facility]    Staff Present  Russella Dar, MS, EP, [facility], Exercise Physiologist;Luma Clopper Zachery Conch, Exercise Physiologist    Medication changes reported      No    Fall or balance concerns reported     No    Warm-up and Cool-down  Performed as group-led instruction    Resistance Training Performed  Yes    VAD Patient?  No    PAD/SET Patient?  No      Pain Assessment   Currently in Pain?  No/denies    Pain Score  0-No pain    Multiple Pain Sites  No       Capillary Blood Glucose: No results found for this or any previous visit (from the past 24 hour(s)).    Social History   Tobacco Use  Smoking Status Never Smoker  Smokeless Tobacco Never Used    Goals Met:  Proper associated with RPD/PD & O2 Sat Independence with exercise equipment Exercise tolerated well No report of cardiac concerns or symptoms Strength training completed today  Goals Unmet:  Not Applicable  Comments: Pt able to follow exercise prescription today without complaint.  Will continue to monitor for progression. Check out 9:15.   Dr. Kate Sable is Medical Director for [facility] Cardiac and Pulmonary Rehab.

## 2018-09-20 ENCOUNTER — Encounter (HOSPITAL_COMMUNITY)
Admission: RE | Admit: 2018-09-20 | Discharge: 2018-09-20 | Disposition: A | Discharge status: Home / Self Care | Payer: Medicare Other | Source: Ambulatory Visit | Attending: Cardiology | Admitting: Cardiology

## 2018-09-20 DIAGNOSIS — Z951 Presence of aortocoronary bypass graft: Secondary | ICD-10-CM

## 2018-09-20 DIAGNOSIS — I214 Non-ST elevation (NSTEMI) myocardial infarction: Secondary | ICD-10-CM | POA: Diagnosis not present

## 2018-09-20 NOTE — Progress Notes (Signed)
Daily Session Note  Patient Details  Name: Alejandro [name] MRN: [id number] Date of Birth: 02-28-1941 Referring Provider:     [place] from 08/13/2018 in [place]  Referring Provider  [name]      Encounter Date: 09/20/2018  Check In: Session Check In - 09/20/18 0815      Check-In   Supervising physician immediately available to respond to emergencies  See telemetry face sheet for immediately available MD    Location  [facility]    Staff Present  Russella Dar, MS, EP, [facility], Exercise Physiologist;Zaron Zwiefelhofer Zachery Conch, Exercise Physiologist    Medication changes reported      No    Fall or balance concerns reported     No    Warm-up and Cool-down  Performed as group-led instruction    Resistance Training Performed  Yes    VAD Patient?  No    PAD/SET Patient?  No      Pain Assessment   Currently in Pain?  No/denies    Pain Score  0-No pain    Multiple Pain Sites  No       Capillary Blood Glucose: No results found for this or any previous visit (from the past 24 hour(s)).    Social History   Tobacco Use  Smoking Status Never Smoker  Smokeless Tobacco Never Used    Goals Met:  Pro[place] associated with RPD/PD & O2 Sat Independence with exercise equipment Exercise tolerated well No report of cardiac concerns or symptoms Strength training completed today  Goals Unmet:  Not Applicable  Comments: Pt able to follow exercise prescription today without complaint.  Will continue to monitor for progression. Check out 9:15.   Dr. Kate Sable is Medical Director for [facility] Cardiac and Pulmonary Rehab.

## 2018-09-22 ENCOUNTER — Encounter (HOSPITAL_COMMUNITY)
Admission: RE | Admit: 2018-09-22 | Discharge: 2018-09-22 | Disposition: A | Discharge status: Home / Self Care | Payer: Medicare Other | Source: Ambulatory Visit | Attending: Cardiology | Admitting: Cardiology

## 2018-09-22 DIAGNOSIS — I214 Non-ST elevation (NSTEMI) myocardial infarction: Secondary | ICD-10-CM

## 2018-09-22 DIAGNOSIS — Z951 Presence of aortocoronary bypass graft: Secondary | ICD-10-CM | POA: Diagnosis not present

## 2018-09-22 NOTE — Progress Notes (Signed)
Daily Session Note  Patient Details  Name: Alejandro [name] MRN: [id number] Date of Birth: 05/12/1941 Referring Provider:     [place] from 08/13/2018 in [place]  Referring Provider  [name]      Encounter Date: 09/22/2018  Check In: Session Check In - 09/22/18 0815      Check-In   Supervising physician immediately available to respond to emergencies  See telemetry face sheet for immediately available MD    Location  [facility]    Staff Present  Russella Dar, MS, EP, [facility], Exercise Physiologist;Peighton Mehra Zachery Conch, Exercise Physiologist    Medication changes reported      No    Fall or balance concerns reported     No    Warm-up and Cool-down  Performed as group-led instruction    Resistance Training Performed  Yes    VAD Patient?  No    PAD/SET Patient?  No      Pain Assessment   Currently in Pain?  No/denies    Pain Score  0-No pain    Multiple Pain Sites  No       Capillary Blood Glucose: No results found for this or any previous visit (from the past 24 hour(s)).    Social History   Tobacco Use  Smoking Status Never Smoker  Smokeless Tobacco Never Used    Goals Met:  Pro[place] associated with RPD/PD & O2 Sat Independence with exercise equipment Exercise tolerated well No report of cardiac concerns or symptoms Strength training completed today  Goals Unmet:  Not Applicable  Comments: Pt able to follow exercise prescription today without complaint.  Will continue to monitor for progression. Check out 9:15.   Dr. Kate Sable is Medical Director for [facility] Cardiac and Pulmonary Rehab.

## 2018-09-24 ENCOUNTER — Encounter (HOSPITAL_COMMUNITY): Payer: Medicare Other

## 2018-09-27 ENCOUNTER — Encounter (HOSPITAL_COMMUNITY): Payer: Medicare Other

## 2018-09-29 ENCOUNTER — Encounter (HOSPITAL_COMMUNITY)
Admission: RE | Admit: 2018-09-29 | Discharge: 2018-09-29 | Disposition: A | Discharge status: Home / Self Care | Payer: Medicare Other | Source: Ambulatory Visit | Attending: Cardiology | Admitting: Cardiology

## 2018-09-29 DIAGNOSIS — Z951 Presence of aortocoronary bypass graft: Secondary | ICD-10-CM | POA: Diagnosis not present

## 2018-09-29 DIAGNOSIS — I214 Non-ST elevation (NSTEMI) myocardial infarction: Secondary | ICD-10-CM

## 2018-09-29 NOTE — Progress Notes (Signed)
Daily Session Note  Patient Details  Name: Alejandro [name] MRN: [id number] Date of Birth: Jan 10, 1941 Referring Provider:     [place] from 08/13/2018 in [place]  Referring Provider  [name]      Encounter Date: 09/29/2018  Check In: Session Check In - 09/29/18 0815      Check-In   Supervising physician immediately available to respond to emergencies  See telemetry face sheet for immediately available MD    Location  [facility]    Staff Present  Russella Dar, MS, EP, [facility], Exercise Physiologist;Amanda Zachery Conch, Exercise Physiologist;Jeanifer Halliday Wynetta Emery, RN, BSN    Medication changes reported      No    Fall or balance concerns reported     No    Warm-up and Cool-down  Performed as group-led instruction    Resistance Training Performed  Yes    VAD Patient?  No    PAD/SET Patient?  No      Pain Assessment   Currently in Pain?  No/denies    Pain Score  0-No pain    Multiple Pain Sites  No       Capillary Blood Glucose: No results found for this or any previous visit (from the past 24 hour(s)).    Social History   Tobacco Use  Smoking Status Never Smoker  Smokeless Tobacco Never Used    Goals Met:  Independence with exercise equipment Exercise tolerated well No report of cardiac concerns or symptoms Strength training completed today  Goals Unmet:  Not Applicable  Comments: Pt able to follow exercise prescription today without complaint.  Will continue to monitor for progression. Check out 915.   Dr. Kate Sable is Medical Director for [facility] Cardiac and Pulmonary Rehab.

## 2018-10-01 ENCOUNTER — Encounter (HOSPITAL_COMMUNITY)
Admission: RE | Admit: 2018-10-01 | Discharge: 2018-10-01 | Disposition: A | Discharge status: Home / Self Care | Payer: Medicare Other | Source: Ambulatory Visit | Attending: Cardiology | Admitting: Cardiology

## 2018-10-01 DIAGNOSIS — Z951 Presence of aortocoronary bypass graft: Secondary | ICD-10-CM | POA: Insufficient documentation

## 2018-10-01 DIAGNOSIS — I214 Non-ST elevation (NSTEMI) myocardial infarction: Secondary | ICD-10-CM | POA: Diagnosis not present

## 2018-10-01 NOTE — Progress Notes (Signed)
Daily Session Note  Patient Details  Name: Alejandro [name] MRN: [id number] Date of Birth: 03/23/1941 Referring Provider:     [place] from 08/13/2018 in [place]  Referring Provider  [name]      Encounter Date: 10/01/2018  Check In: Session Check In - 10/01/18 0815      Check-In   Supervising physician immediately available to respond to emergencies  See telemetry face sheet for immediately available MD    Location  [facility]    Staff Present  Russella Dar, MS, EP, [facility], Exercise Physiologist;Shilah Hefel Wynetta Emery, RN, BSN    Medication changes reported      No    Fall or balance concerns reported     No    Warm-up and Cool-down  Performed as group-led instruction    Resistance Training Performed  Yes    VAD Patient?  No    PAD/SET Patient?  No      Pain Assessment   Currently in Pain?  No/denies    Pain Score  0-No pain    Multiple Pain Sites  No       Capillary Blood Glucose: No results found for this or any previous visit (from the past 24 hour(s)).    Social History   Tobacco Use  Smoking Status Never Smoker  Smokeless Tobacco Never Used    Goals Met:  Independence with exercise equipment Exercise tolerated well No report of cardiac concerns or symptoms Strength training completed today  Goals Unmet:  Not Applicable  Comments: Pt able to follow exercise prescription today without complaint.  Will continue to monitor for progression. Check out 915.   Dr. Kate Sable is Medical Director for [facility] Cardiac and Pulmonary Rehab.

## 2018-10-04 ENCOUNTER — Encounter (HOSPITAL_COMMUNITY)
Admission: RE | Admit: 2018-10-04 | Discharge: 2018-10-04 | Disposition: A | Discharge status: Home / Self Care | Payer: Medicare Other | Source: Ambulatory Visit | Attending: Cardiology | Admitting: Cardiology

## 2018-10-04 DIAGNOSIS — Z951 Presence of aortocoronary bypass graft: Secondary | ICD-10-CM | POA: Diagnosis not present

## 2018-10-04 DIAGNOSIS — I214 Non-ST elevation (NSTEMI) myocardial infarction: Secondary | ICD-10-CM | POA: Diagnosis not present

## 2018-10-04 NOTE — Progress Notes (Signed)
Cardiac Individual Treatment Plan  Patient Details  Name: Alejandro [name] MRN: [id number] Date of Birth: 08/01/1941 Referring Provider:     CARDIAC REHAB PHASE II ORIENTATION from 08/13/2018 in [facility] [facility]  Referring Provider  McDowell      Initial Encounter Date:    CARDIAC REHAB PHASE II ORIENTATION from 08/13/2018 in [place] [facility]  Date  08/13/18      Visit Diagnosis: NSTEMI (non-ST elevated myocardial infarction) (HCC)  S/P CABG x 4  Patient's Home Medications on Admission:  Current Outpatient Medications:  .  ALPRAZolam (XANAX) 0.5 MG tablet, Take 0.5 mg by mouth at bedtime as needed for sleep., Disp: , Rfl:  .  aspirin 325 MG EC tablet, Take 1 tablet (325 mg total) by mouth daily., Disp: 30 tablet, Rfl: 0 .  atorvastatin (LIPITOR) 80 MG tablet, TAKE 1 TABLET BY MOUTH EVERY DAY AT 6 PM, Disp: 30 tablet, Rfl: 6 .  DEXILANT 60 MG capsule, TAKE ONE CAPSULE BY MOUTH EVERY DAY., Disp: 30 capsule, Rfl: 5 .  hydrALAZINE (APRESOLINE) 25 MG tablet, Take 1 tablet (25 mg total) by mouth every 8 (eight) hours., Disp: 90 tablet, Rfl: 1 .  metoprolol tartrate (LOPRESSOR) 50 MG tablet, TAKE 1 TABLET BY MOUTH TWICE DAILY, Disp: 180 tablet, Rfl: 2 .  sitaGLIPtan-metformin (JANUMET) 50-1000 MG per tablet, Take 1 tablet by mouth 2 (two) times daily with a meal.  , Disp: , Rfl:   Past Medical History: Past Medical History:  Diagnosis Date  . Aortic insufficiency    a. mild-mod by TEE 05/2018 (no aortic stenosis).  . CAD (coronary artery disease)    a. NSTEMI 05/2018 with multivessel disease -> s/p CABGx4 05/11/18.  . Chronic diastolic CHF (congestive heart failure) (HCC)    a. dx 05/2018.  . CKD (chronic kidney disease), stage III (HCC)   . History of nephrolithiasis 2004  . Hyperlipidemia   . Hypertension   . IBS (irritable colon syndrome)   . Postoperative atrial fibrillation (HCC)   . PVC's (premature ventricular contractions)   . Reflux   . Type  2 diabetes mellitus (HCC)     Tobacco Use: Social History   Tobacco Use  Smoking Status Never Smoker  Smokeless Tobacco Never Used    Labs: Recent Review Flowsheet Data    Labs for ITP Cardiac and Pulmonary Rehab Latest Ref Rng & Units 05/11/2018 05/11/2018 05/12/2018 05/12/2018 05/12/2018   Hemoglobin A1c 4.8 - 5.6 % - - - - -   PHART 7.350 - 7.450 7.326(L) 7.368 7.376 - -   PCO2ART 32.0 - 48.0 mmHg 40.8 38.1 39.5 - -   HCO3 20.0 - 28.0 mmol/L 21.6 21.8 23.1 - -   TCO2 22 - 32 mmol/L 23 23 24 22 21(L)   ACIDBASEDEF 0.0 - 2.0 mmol/L 5.0(H) 3.0(H) 2.0 - -   O2SAT % 97.0 98.0 98.0 - -      Capillary Blood Glucose: Lab Results  Component Value Date   GLUCAP 126 (H) 08/30/2018   GLUCAP 181 (H) 08/23/2018   GLUCAP 231 (H) 08/18/2018   GLUCAP 213 (H) 08/16/2018   GLUCAP 127 (H) 05/17/2018     Exercise Target Goals: Exercise Program Goal: Individual exercise prescription set using results from initial 6 min walk test and THRR while considering  patient's activity barriers and safety.   Exercise Prescription Goal: Starting with aerobic activity 30 plus minutes a day, 3 days per week for initial exercise prescription. Provide home exercise prescription and   guidelines that participant acknowledges understanding prior to discharge.  Activity Barriers & Risk Stratification: Activity Barriers & Cardiac Risk Stratification - 08/13/18 1352      Activity Barriers & Cardiac Risk Stratification   Activity Barriers  Muscular Weakness;Other (comment)    Comments  calf pain     Cardiac Risk Stratification  High       6 Minute Walk: 6 Minute Walk    Row Name 08/13/18 1351         6 Minute Walk   Phase  Initial     Distance  1200 feet     Walk Time  6 minutes     # of Rest Breaks  0     MPH  2.27     METS  2.74     RPE  10     Perceived Dyspnea   9     VO2 Peak  8.68     Symptoms  Yes (comment)     Comments  pain in both calves 6/10, 2/10 after 2 minute rest      Resting HR   66 bpm     Resting BP  156/80     Resting Oxygen Saturation   98 %     Exercise Oxygen Saturation  during 6 min walk  96 %     Max Ex. HR  86 bpm     Max Ex. BP  194/82     2 Minute Post BP  168/78        Oxygen Initial Assessment:   Oxygen Re-Evaluation:   Oxygen Discharge (Final Oxygen Re-Evaluation):   Initial Exercise Prescription: Initial Exercise Prescription - 08/13/18 1400      Date of Initial Exercise RX and Referring Provider   Date  08/13/18    Referring Provider  McDowell      NuStep   Level  1    SPM  54    Minutes  17    METs  1.8      Arm Ergometer   Level  1    Watts  12    RPM  55    Minutes  17    METs  1.9      Prescription Details   Frequency (times per week)  3    Duration  Progress to 30 minutes of continuous aerobic without signs/symptoms of physical distress      Intensity   THRR 40-80% of Max Heartrate  97-113-129    Ratings of Perceived Exertion  11-13    Perceived Dyspnea  0-4      Progression   Progression  Continue to progress workloads to maintain intensity without signs/symptoms of physical distress.      Resistance Training   Training Prescription  Yes    Weight  1    Reps  10-15       Perform Capillary Blood Glucose checks as needed.  Exercise Prescription Changes:  Exercise Prescription Changes    Row Name 08/30/18 1400 09/20/18 1500           Response to Exercise   Blood Pressure (Admit)  134/70  140/70      Blood Pressure (Exercise)  158/66  142/70      Blood Pressure (Exit)  138/72  130/70      Heart Rate (Admit)  74 bpm  61 bpm      Heart Rate (Exercise)  96 bpm  80 bpm      Heart Rate (Exit)    81 bpm  70 bpm      Rating of Perceived Exertion (Exercise)  12  12      Comments  First full week of exercise   increased overall MET level      Duration  Progress to 30 minutes of  aerobic without signs/symptoms of physical distress  Progress to 30 minutes of  aerobic without signs/symptoms of physical distress       Intensity  THRR unchanged  THRR unchanged        Progression   Progression  Continue to progress workloads to maintain intensity without signs/symptoms of physical distress.  Continue to progress workloads to maintain intensity without signs/symptoms of physical distress.      Average METs  2.2  2.75        Resistance Training   Training Prescription  Yes  Yes      Weight  2  2      Reps  10-15  10-15        NuStep   Level  1  2      SPM  86  99      Minutes  17  17      METs  2.1  2.5        Arm Ergometer   Level  2  2      Watts  16  25      RPM  52  69      Minutes  22  22      METs  2.3  3        Home Exercise Plan   Plans to continue exercise at  Home (comment) walking  Home (comment)      Frequency  Add 2 additional days to program exercise sessions.  Add 2 additional days to program exercise sessions.      Initial Home Exercises Provided  08/13/18  08/13/18         Exercise Comments:  Exercise Comments    Row Name 08/16/18 1624 09/06/18 1411 10/01/18 1441       Exercise Comments  Patient wants to get stronger.   Patient wants to get stronger and has recently stated he would like to sleep better. He works hard everyday in rehab and reports that it is helping him to be able to do more around the house. He cooked himself dinner for the first time since his surgery!   Patient has reported that he is feeling stronger overall. He is still having trouble with his R should which holds him back some, but not much. He has increased to level 3 on the Arm Ergometer. He is happy to be returning to work a few hours a week as tolerated. Patient reports he is still not sleeping well most nights.         Exercise Goals and Review:  Exercise Goals    Row Name 08/13/18 1357             Exercise Goals   Increase Physical Activity  Yes       Intervention  Provide advice, education, support and counseling about physical activity/exercise needs.;Develop an individualized  exercise prescription for aerobic and resistive training based on initial evaluation findings, risk stratification, comorbidities and participant's personal goals.       Expected Outcomes  Short Term: Attend rehab on a regular basis to increase amount of physical activity.       Increase Strength and Stamina  Yes         Intervention  Provide advice, education, support and counseling about physical activity/exercise needs.;Develop an individualized exercise prescription for aerobic and resistive training based on initial evaluation findings, risk stratification, comorbidities and participant's personal goals.       Expected Outcomes  Short Term: Increase workloads from initial exercise prescription for resistance, speed, and METs.;Short Term: Perform resistance training exercises routinely during rehab and add in resistance training at home;Long Term: Improve cardiorespiratory fitness, muscular endurance and strength as measured by increased METs and functional capacity (6MWT)       Able to understand and use rate of perceived exertion (RPE) scale  Yes       Intervention  Provide education and explanation on how to use RPE scale       Expected Outcomes  Short Term: Able to use RPE daily in rehab to express subjective intensity level;Long Term:  Able to use RPE to guide intensity level when exercising independently       Knowledge and understanding of Target Heart Rate Range (THRR)  Yes       Intervention  Provide education and explanation of THRR including how the numbers were predicted and where they are located for reference       Expected Outcomes  Short Term: Able to state/look up THRR;Long Term: Able to use THRR to govern intensity when exercising independently;Short Term: Able to use daily as guideline for intensity in rehab       Able to check pulse independently  Yes       Intervention  Provide education and demonstration on how to check pulse in carotid and radial arteries.;Review the importance  of being able to check your own pulse for safety during independent exercise       Expected Outcomes  Short Term: Able to explain why pulse checking is important during independent exercise;Long Term: Able to check pulse independently and accurately       Understanding of Exercise Prescription  Yes       Intervention  Provide education, explanation, and written materials on patient's individual exercise prescription       Expected Outcomes  Short Term: Able to explain program exercise prescription;Long Term: Able to explain home exercise prescription to exercise independently          Exercise Goals Re-Evaluation : Exercise Goals Re-Evaluation    Row Name 08/16/18 1623 09/06/18 1409 10/01/18 1433         Exercise Goal Re-Evaluation   Exercise Goals Review  Increase Physical Activity;Increase Strength and Stamina;Able to understand and use rate of perceived exertion (RPE) scale;Knowledge and understanding of Target Heart Rate Range (THRR);Able to check pulse independently;Understanding of Exercise Prescription  Increase Physical Activity;Increase Strength and Stamina;Able to understand and use rate of perceived exertion (RPE) scale;Knowledge and understanding of Target Heart Rate Range (THRR);Able to check pulse independently;Understanding of Exercise Prescription  Increase Physical Activity;Increase Strength and Stamina;Able to understand and use rate of perceived exertion (RPE) scale;Knowledge and understanding of Target Heart Rate Range (THRR);Able to check pulse independently;Understanding of Exercise Prescription     Comments  Patient just started program. has completed 2 visits. Will continue to monitor progress.   Patient has done well in the program. He has stated that he feels stronger and is getting better. He has progressed to level 2 on both pieces of equipment (NuStep and Arm Ergometer). His R shoulder bothers him so he has not progressed on weights. We will continue to monitor his  progress.   Patient continues to gain  strength overall. He has been able to return to work some but would still like to return full time after the program. Pateint still has pain in his R shoulder. He has increased his overall MET level. We will continue to progress him as tolerable.      Expected Outcomes  Gain strength  Gain strength to get back to work.   Increase strength and stamina to get back to work full time.          Discharge Exercise Prescription (Final Exercise Prescription Changes): Exercise Prescription Changes - 09/20/18 1500      Response to Exercise   Blood Pressure (Admit)  140/70    Blood Pressure (Exercise)  142/70    Blood Pressure (Exit)  130/70    Heart Rate (Admit)  61 bpm    Heart Rate (Exercise)  80 bpm    Heart Rate (Exit)  70 bpm    Rating of Perceived Exertion (Exercise)  12    Comments  increased overall MET level    Duration  Progress to 30 minutes of  aerobic without signs/symptoms of physical distress    Intensity  THRR unchanged      Progression   Progression  Continue to progress workloads to maintain intensity without signs/symptoms of physical distress.    Average METs  2.75      Resistance Training   Training Prescription  Yes    Weight  2    Reps  10-15      NuStep   Level  2    SPM  99    Minutes  17    METs  2.5      Arm Ergometer   Level  2    Watts  25    RPM  69    Minutes  22    METs  3      Home Exercise Plan   Plans to continue exercise at  Home (comment)    Frequency  Add 2 additional days to program exercise sessions.    Initial Home Exercises Provided  08/13/18       Nutrition:  Target Goals: Understanding of nutrition guidelines, daily intake of sodium <1500mg, cholesterol <200mg, calories 30% from fat and 7% or less from saturated fats, daily to have 5 or more servings of fruits and vegetables.  Biometrics: Pre Biometrics - 08/13/18 1353      Pre Biometrics   Height  5' 11" (1.803 m)    Waist Circumference   42 inches    Hip Circumference  38.5 inches    Waist to Hip Ratio  1.09 %    Triceps Skinfold  6 mm    % Body Fat  26.4 %    Grip Strength  25.1 kg    Flexibility  14.3 in    Single Leg Stand  2 seconds        Nutrition Therapy Plan and Nutrition Goals: Nutrition Therapy & Goals - 10/04/18 1428      Nutrition Therapy   RD appointment deferred  Yes      Personal Nutrition Goals   Comments  Patient continues to say he is trying to eat less carbs and sweets and heart healthy. Will continue to monitor for progress.        Nutrition Assessments: Nutrition Assessments - 08/13/18 1514      MEDFICTS Scores   Pre Score  12       Nutrition Goals Re-Evaluation:   Nutrition Goals Discharge (  Final Nutrition Goals Re-Evaluation):   Psychosocial: Target Goals: Acknowledge presence or absence of significant depression and/or stress, maximize coping skills, provide positive support system. Participant is able to verbalize types and ability to use techniques and skills needed for reducing stress and depression.  Initial Review & Psychosocial Screening: Initial Psych Review & Screening - 08/13/18 1512      Initial Review   Current issues with  None Identified      Family Dynamics   Good Support System?  Yes      Barriers   Psychosocial barriers to participate in program  There are no identifiable barriers or psychosocial needs.      Screening Interventions   Interventions  Encouraged to exercise    Expected Outcomes  Short Term goal: Identification and review with participant of any Quality of Life or Depression concerns found by scoring the questionnaire.;Long Term goal: The participant improves quality of Life and PHQ9 Scores as seen by post scores and/or verbalization of changes       Quality of Life Scores: Quality of Life - 08/13/18 1358      Quality of Life   Select  Quality of Life      Quality of Life Scores   Health/Function Pre  28.68 %    Socioeconomic Pre   30 %    Psych/Spiritual Pre  30 %    Family Pre  19.5 %    GLOBAL Pre  28.44 %      Scores of 19 and below usually indicate a poorer quality of life in these areas.  A difference of  2-3 points is a clinically meaningful difference.  A difference of 2-3 points in the total score of the Quality of Life Index has been associated with significant improvement in overall quality of life, self-image, physical symptoms, and general health in studies assessing change in quality of life.  PHQ-9: Recent Review Flowsheet Data    Depression screen PHQ 2/9 09/09/2018 08/13/2018 06/22/2018 05/27/2018   Decreased Interest 0 0 0 0   Down, Depressed, Hopeless 0 0 0 1    PHQ - 2 Score 0 0 0 1   Altered sleeping - 2 - -   Tired, decreased energy - 0 - -   Change in appetite - 0 - -   Feeling bad or failure about yourself  - 0 - -   Trouble concentrating - 0 - -   Moving slowly or fidgety/restless - 0 - -   Suicidal thoughts - 0 - -   PHQ-9 Score - 2 - -   Difficult doing work/chores - Somewhat difficult - -     Interpretation of Total Score  Total Score Depression Severity:  1-4 = Minimal depression, 5-9 = Mild depression, 10-14 = Moderate depression, 15-19 = Moderately severe depression, 20-27 = Severe depression   Psychosocial Evaluation and Intervention: Psychosocial Evaluation - 08/13/18 1513      Psychosocial Evaluation & Interventions   Interventions  Encouraged to exercise with the program and follow exercise prescription    Continue Psychosocial Services   No Follow up required       Psychosocial Re-Evaluation: Psychosocial Re-Evaluation    Row Name 09/06/18 1431 10/04/18 1431           Psychosocial Re-Evaluation   Current issues with  Current Sleep Concerns  Current Sleep Concerns      Comments  Patient initial QOL score was 28.44 and his PHQ-9 score was 2 with no psychosocial issues   identified. He says he has difficulty going to sleep at times and hopes this will resolve on his  own.   Patient initial QOL score was 28.44 and his PHQ-9 score was 2 with no psychosocial issues identified. His sleep has improved some since last 30 day review. Will continue to monitor.       Expected Outcomes  Patient will have no psychosocial issues identified at discharge.   Patient will have no psychosocial issues identified at discharge.       Interventions  Stress management education;Encouraged to attend Cardiac Rehabilitation for the exercise;Relaxation education  Stress management education;Encouraged to attend Cardiac Rehabilitation for the exercise;Relaxation education      Continue Psychosocial Services   No Follow up required  No Follow up required         Psychosocial Discharge (Final Psychosocial Re-Evaluation): Psychosocial Re-Evaluation - 10/04/18 1431      Psychosocial Re-Evaluation   Current issues with  Current Sleep Concerns    Comments  Patient initial QOL score was 28.44 and his PHQ-9 score was 2 with no psychosocial issues identified. His sleep has improved some since last 30 day review. Will continue to monitor.     Expected Outcomes  Patient will have no psychosocial issues identified at discharge.     Interventions  Stress management education;Encouraged to attend Cardiac Rehabilitation for the exercise;Relaxation education    Continue Psychosocial Services   No Follow up required       Vocational Rehabilitation: Provide vocational rehab assistance to qualifying candidates.   Vocational Rehab Evaluation & Intervention: Vocational Rehab - 08/13/18 1516      Initial Vocational Rehab Evaluation & Intervention   Assessment shows need for Vocational Rehabilitation  No       Education: Education Goals: Education classes will be provided on a weekly basis, covering required topics. Participant will state understanding/return demonstration of topics presented.  Learning Barriers/Preferences: Learning Barriers/Preferences - 08/13/18 1515      Learning  Barriers/Preferences   Learning Barriers  None    Learning Preferences  Audio;Pictoral;Video       Education Topics: Hypertension, Hypertension Reduction -Define heart disease and high blood pressure. Discus how high blood pressure affects the body and ways to reduce high blood pressure.   Exercise and Your Heart -Discuss why it is important to exercise, the FITT principles of exercise, normal and abnormal responses to exercise, and how to exercise safely.   Angina -Discuss definition of angina, causes of angina, treatment of angina, and how to decrease risk of having angina.   Cardiac Medications -Review what the following cardiac medications are used for, how they affect the body, and side effects that may occur when taking the medications.  Medications include Aspirin, Beta blockers, calcium channel blockers, ACE Inhibitors, angiotensin receptor blockers, diuretics, digoxin, and antihyperlipidemics.   Congestive Heart Failure -Discuss the definition of CHF, how to live with CHF, the signs and symptoms of CHF, and how keep track of weight and sodium intake.   Heart Disease and Intimacy -Discus the effect sexual activity has on the heart, how changes occur during intimacy as we age, and safety during sexual activity.   Smoking Cessation / COPD -Discuss different methods to quit smoking, the health benefits of quitting smoking, and the definition of COPD.   CARDIAC REHAB PHASE II EXERCISE from 09/29/2018 in [place]  Date  08/18/18  Educator  Etheleen Mayhew  Instruction Review Code  2- Demonstrated Understanding      Nutrition  I: Fats -Discuss the types of cholesterol, what cholesterol does to the heart, and how cholesterol levels can be controlled.   CARDIAC REHAB PHASE II EXERCISE from 09/29/2018 in [place] [facility]  Date  08/25/18  Educator  D. Coad  Instruction Review Code  2- Demonstrated Understanding      Nutrition II:  Labels -Discuss the different components of food labels and how to read food label   CARDIAC REHAB PHASE II EXERCISE from 09/29/2018 in [place] [facility]  Date  09/01/18  Educator  D. Johnson  Instruction Review Code  2- Demonstrated Understanding      Heart Parts/Heart Disease and PAD -Discuss the anatomy of the heart, the pathway of blood circulation through the heart, and these are affected by heart disease.   CARDIAC REHAB PHASE II EXERCISE from 09/29/2018 in [place] [facility]  Date  09/08/18  Educator  D. Johnson  Instruction Review Code  2- Demonstrated Understanding      Stress I: Signs and Symptoms -Discuss the causes of stress, how stress may lead to anxiety and depression, and ways to limit stress.   CARDIAC REHAB PHASE II EXERCISE from 09/29/2018 in [place] [facility]  Date  09/15/18  Educator  D.Coad  Instruction Review Code  2- Demonstrated Understanding      Stress II: Relaxation -Discuss different types of relaxation techniques to limit stress.   CARDIAC REHAB PHASE II EXERCISE from 09/29/2018 in [place] [facility]  Date  09/22/18  Educator  D. Coad  Instruction Review Code  2- Demonstrated Understanding      Warning Signs of Stroke / TIA -Discuss definition of a stroke, what the signs and symptoms are of a stroke, and how to identify when someone is having stroke.   CARDIAC REHAB PHASE II EXERCISE from 09/29/2018 in  CARDIAC REHABILITATION  Date  09/29/18  Educator  D. Johnson  Instruction Review Code  2- Demonstrated Understanding      Knowledge Questionnaire Score: Knowledge Questionnaire Score - 08/13/18 1516      Knowledge Questionnaire Score   Pre Score  16/24       Core Components/Risk Factors/Patient Goals at Admission: Personal Goals and Risk Factors at Admission - 08/13/18 1516      Core Components/Risk Factors/Patient Goals on Admission    Weight Management   Weight Maintenance    Personal Goal Other  Yes    Personal Goal  Get stronger    Intervention  Attend CR 3xweek and supplement 2 x week with home exercise    Expected Outcomes  Reach personal goals.       Core Components/Risk Factors/Patient Goals Review:  Goals and Risk Factor Review    Row Name 09/06/18 1427 10/04/18 1428           Core Components/Risk Factors/Patient Goals Review   Personal Goals Review  Weight Management/Obesity;Diabetes Gain strength; continue to gain strength.   Weight Management/Obesity;Diabetes Gain strength; continue to gain strength.       Review  Patient has completed 10 sessions losing 2 lbs since his initial visit. He is doing well in the program with progression. He says he feels better overall and does feel stronger. His doctor will not release him to return to work until he complete the program. His reported glucose readings have improved since he started the program. His last Hgb A1C was on 05/30/18 at 6.0. He says this will be repeated this month. He is very anxious to   go back to work. He says he gets bored around the house but is now feeling like doing more around the house. He cooked dinner recently for the first time since his event. Will continue to monitor for progress.   Patient has completed 20 sessions maintaining his weight since last 30 day review. He continues to do well in the program with progression. He says the program is helping him be more active and he feels stronger and better overall. 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 His fasting glucose readings have improved. No recent A1C's on file. He says he is continues to try and limit his carbs. Will continue to monitor for progress.       Expected Outcomes  Patient will continue to attend sessions and complete the program meeting his personal goals.   Patient will continue to attend sessions and complete the program meeting his personal goals.          Core  Components/Risk Factors/Patient Goals at Discharge (Final Review):  Goals and Risk Factor Review - 10/04/18 1428      Core Components/Risk Factors/Patient Goals Review   Personal Goals Review  Weight Management/Obesity;Diabetes   Gain strength; continue to gain strength.    Review  Patient has completed 20 sessions maintaining his weight since last 30 day review. He continues to do well in the program with progression. He says the program is helping him be more active and he feels stronger and better overall. 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 His fasting glucose readings have improved. No recent A1C's on file. He says he is continues to try and limit his carbs. Will continue to monitor for progress.     Expected Outcomes  Patient will continue to attend sessions and complete the program meeting his personal goals.        ITP Comments: ITP Comments    Row Name 08/19/18 1019           ITP Comments  Patient new to program completing 3 sessions. Will continue to monitor for progress.           Comments: ITP REVIEW Patient is doing well in the program. Will continue to monitor for progress.

## 2018-10-04 NOTE — Progress Notes (Signed)
Daily Session Note  Patient Details  Name: Alejandro [name] MRN: [id number] Date of Birth: June 11, 1941 Referring Provider:     [place] from 08/13/2018 in [place]  Referring Provider  [name]      Encounter Date: 10/04/2018  Check In: Session Check In - 10/04/18 0815      Check-In   Supervising physician immediately available to respond to emergencies  See telemetry face sheet for immediately available MD    Location  [facility]    Staff Present  Russella Dar, MS, EP, [facility], Exercise Physiologist;Kavaughn Faucett Wynetta Emery, RN, Cory Munch, Exercise Physiologist    Medication changes reported      No    Fall or balance concerns reported     No    Warm-up and Cool-down  Performed as group-led instruction    Resistance Training Performed  Yes    VAD Patient?  No    PAD/SET Patient?  No      Pain Assessment   Currently in Pain?  No/denies    Pain Score  0-No pain    Multiple Pain Sites  No       Capillary Blood Glucose: No results found for this or any previous visit (from the past 24 hour(s)).    Social History   Tobacco Use  Smoking Status Never Smoker  Smokeless Tobacco Never Used    Goals Met:  Independence with exercise equipment Exercise tolerated well No report of cardiac concerns or symptoms Strength training completed today  Goals Unmet:  Not Applicable  Comments: Pt able to follow exercise prescription today without complaint.  Will continue to monitor for progression. Check out 915.   Dr. Kate Sable is Medical Director for [facility] Cardiac and Pulmonary Rehab.

## 2018-10-06 ENCOUNTER — Encounter (HOSPITAL_COMMUNITY)
Admission: RE | Admit: 2018-10-06 | Discharge: 2018-10-06 | Disposition: A | Discharge status: Home / Self Care | Payer: Medicare Other | Source: Ambulatory Visit | Attending: Cardiology | Admitting: Cardiology

## 2018-10-06 DIAGNOSIS — Z951 Presence of aortocoronary bypass graft: Secondary | ICD-10-CM

## 2018-10-06 DIAGNOSIS — I214 Non-ST elevation (NSTEMI) myocardial infarction: Secondary | ICD-10-CM

## 2018-10-06 NOTE — Progress Notes (Signed)
Daily Session Note  Patient Details  Name: Alejandro [name] MRN: [id number] Date of Birth: 11/25/1941 Referring Provider:     [place] from 08/13/2018 in [place]  Referring Provider  [name]      Encounter Date: 10/06/2018  Check In: Session Check In - 10/06/18 0815      Check-In   Supervising physician immediately available to respond to emergencies  See telemetry face sheet for immediately available MD    Location  [facility]    Staff Present  Russella Dar, MS, EP, [facility], Exercise Physiologist;Monia Timmers Wynetta Emery, RN, Cory Munch, Exercise Physiologist    Medication changes reported      No    Fall or balance concerns reported     No    Warm-up and Cool-down  Performed as group-led instruction    Resistance Training Performed  Yes    VAD Patient?  No    PAD/SET Patient?  No      Pain Assessment   Currently in Pain?  No/denies    Pain Score  0-No pain    Multiple Pain Sites  No       Capillary Blood Glucose: No results found for this or any previous visit (from the past 24 hour(s)).    Social History   Tobacco Use  Smoking Status Never Smoker  Smokeless Tobacco Never Used    Goals Met:  Independence with exercise equipment Exercise tolerated well No report of cardiac concerns or symptoms Strength training completed today  Goals Unmet:  Not Applicable  Comments: Pt able to follow exercise prescription today without complaint.  Will continue to monitor for progression. Check out 915.   Dr. Kate Sable is Medical Director for [facility] Cardiac and Pulmonary Rehab.

## 2018-10-08 ENCOUNTER — Encounter (HOSPITAL_COMMUNITY)
Admission: RE | Admit: 2018-10-08 | Discharge: 2018-10-08 | Disposition: A | Discharge status: Home / Self Care | Payer: Medicare Other | Source: Ambulatory Visit | Attending: Cardiology | Admitting: Cardiology

## 2018-10-08 DIAGNOSIS — Z951 Presence of aortocoronary bypass graft: Secondary | ICD-10-CM | POA: Diagnosis not present

## 2018-10-08 DIAGNOSIS — I214 Non-ST elevation (NSTEMI) myocardial infarction: Secondary | ICD-10-CM | POA: Diagnosis not present

## 2018-10-08 NOTE — Progress Notes (Signed)
Daily Session Note  Patient Details  Name: Alejandro [name] MRN: [id number] Date of Birth: August 10, 1941 Referring Provider:     [place] from 08/13/2018 in [place]  Referring Provider  [name]      Encounter Date: 10/08/2018  Check In: Session Check In - 10/08/18 0815      Check-In   Supervising physician immediately available to respond to emergencies  See telemetry face sheet for immediately available MD    Location  [facility]    Staff Present  Russella Dar, MS, EP, [facility], Exercise Physiologist;Debra Wynetta Emery, RN, [facility] Munch, Exercise Physiologist    Medication changes reported      No    Fall or balance concerns reported     No    Warm-up and Cool-down  Performed as group-led instruction    Resistance Training Performed  Yes    VAD Patient?  No    PAD/SET Patient?  No      Pain Assessment   Currently in Pain?  No/denies    Pain Score  0-No pain    Multiple Pain Sites  No       Capillary Blood Glucose: No results found for this or any previous visit (from the past 24 hour(s)).    Social History   Tobacco Use  Smoking Status Never Smoker  Smokeless Tobacco Never Used    Goals Met:  Independence with exercise equipment Exercise tolerated well No report of cardiac concerns or symptoms Strength training completed today  Goals Unmet:  Not Applicable  Comments: Pt able to follow exercise prescription today without complaint.  Will continue to monitor for progression. Check out 9:30.   Dr. Kate Sable is Medical Director for [facility] Cardiac and Pulmonary Rehab.

## 2018-10-11 ENCOUNTER — Encounter (HOSPITAL_COMMUNITY)
Admission: RE | Admit: 2018-10-11 | Discharge: 2018-10-11 | Disposition: A | Discharge status: Home / Self Care | Payer: Medicare Other | Source: Ambulatory Visit | Attending: Cardiology | Admitting: Cardiology

## 2018-10-11 DIAGNOSIS — I214 Non-ST elevation (NSTEMI) myocardial infarction: Secondary | ICD-10-CM | POA: Diagnosis not present

## 2018-10-11 DIAGNOSIS — Z951 Presence of aortocoronary bypass graft: Secondary | ICD-10-CM

## 2018-10-11 NOTE — Progress Notes (Signed)
Daily Session Note  Patient Details  Name: Alejandro [name] MRN: [id number] Date of Birth: 07/31/1941 Referring Provider:     [place] from 08/13/2018 in [place]  Referring Provider  [name]      Encounter Date: 10/11/2018  Check In: Session Check In - 10/11/18 0815      Check-In   Supervising physician immediately available to respond to emergencies  See telemetry face sheet for immediately available MD    Location  [facility]    Staff Present  Russella Dar, MS, EP, [facility], Exercise Physiologist;Debra Wynetta Emery, RN, BSN    Medication changes reported      No    Fall or balance concerns reported     No    Warm-up and Cool-down  Performed as group-led instruction    Resistance Training Performed  Yes    VAD Patient?  No    PAD/SET Patient?  No      Pain Assessment   Currently in Pain?  No/denies    Pain Score  0-No pain    Multiple Pain Sites  No       Capillary Blood Glucose: No results found for this or any previous visit (from the past 24 hour(s)).    Social History   Tobacco Use  Smoking Status Never Smoker  Smokeless Tobacco Never Used    Goals Met:  Independence with exercise equipment Exercise tolerated well Personal goals reviewed No report of cardiac concerns or symptoms Strength training completed today  Goals Unmet:  Not Applicable  Comments: Check out: 0915    Dr. Kate Sable is Medical Director for [place] and Pulmonary Rehab.

## 2018-10-13 ENCOUNTER — Encounter (HOSPITAL_COMMUNITY)
Admission: RE | Admit: 2018-10-13 | Discharge: 2018-10-13 | Disposition: A | Discharge status: Home / Self Care | Payer: Medicare Other | Source: Ambulatory Visit | Attending: Cardiology | Admitting: Cardiology

## 2018-10-13 DIAGNOSIS — Z951 Presence of aortocoronary bypass graft: Secondary | ICD-10-CM | POA: Diagnosis not present

## 2018-10-13 DIAGNOSIS — I214 Non-ST elevation (NSTEMI) myocardial infarction: Secondary | ICD-10-CM | POA: Diagnosis not present

## 2018-10-13 NOTE — Progress Notes (Signed)
Daily Session Note  Patient Details  Name: OLUWATOBI VISSER MRN: [id number] Date of Birth: [birth year] Referring Provider:     [place] from 08/13/2018 in [place]  Referring Provider  [name]      Encounter Date: 10/13/2018  Check In: Session Check In - 10/13/18 0815      Check-In   Supervising physician immediately available to respond to emergencies  See telemetry face sheet for immediately available MD    Location  [facility]    Staff Present  Russella Dar, MS, EP, [facility], Exercise Physiologist;Xue Low Wynetta Emery, RN, BSN    Medication changes reported      No    Fall or balance concerns reported     No    Warm-up and Cool-down  Performed as group-led instruction    Resistance Training Performed  Yes    VAD Patient?  No    PAD/SET Patient?  No      Pain Assessment   Currently in Pain?  No/denies    Pain Score  0-No pain    Multiple Pain Sites  No       Capillary Blood Glucose: No results found for this or any previous visit (from the past 24 hour(s)).    Social History   Tobacco Use  Smoking Status Never Smoker  Smokeless Tobacco Never Used    Goals Met:  Independence with exercise equipment Exercise tolerated well No report of cardiac concerns or symptoms Strength training completed today  Goals Unmet:  Not Applicable  Comments: Pt able to follow exercise prescription today without complaint.  Will continue to monitor for progression. Check out 915.   Dr. Kate Sable is Medical Director for [facility] Cardiac and Pulmonary Rehab.

## 2018-10-14 DIAGNOSIS — E119 Type 2 diabetes mellitus without complications: Secondary | ICD-10-CM | POA: Diagnosis not present

## 2018-10-14 DIAGNOSIS — E782 Mixed hyperlipidemia: Secondary | ICD-10-CM | POA: Diagnosis not present

## 2018-10-14 DIAGNOSIS — E1169 Type 2 diabetes mellitus with other specified complication: Secondary | ICD-10-CM | POA: Diagnosis not present

## 2018-10-14 DIAGNOSIS — I10 Essential (primary) hypertension: Secondary | ICD-10-CM | POA: Diagnosis not present

## 2018-10-15 ENCOUNTER — Encounter (HOSPITAL_COMMUNITY)
Admission: RE | Admit: 2018-10-15 | Discharge: 2018-10-15 | Disposition: A | Discharge status: Home / Self Care | Payer: Medicare Other | Source: Ambulatory Visit | Attending: Cardiology | Admitting: Cardiology

## 2018-10-15 DIAGNOSIS — Z951 Presence of aortocoronary bypass graft: Secondary | ICD-10-CM | POA: Diagnosis not present

## 2018-10-15 DIAGNOSIS — I214 Non-ST elevation (NSTEMI) myocardial infarction: Secondary | ICD-10-CM | POA: Diagnosis not present

## 2018-10-15 NOTE — Progress Notes (Signed)
Daily Session Note  Patient Details  Name: Alejandro [name] MRN: [id number] Date of Birth: May 08, 1941 Referring Provider:     [place] from 08/13/2018 in [place]  Referring Provider  [name]      Encounter Date: 10/15/2018  Check In: Session Check In - 10/15/18 0815      Check-In   Supervising physician immediately available to respond to emergencies  See telemetry face sheet for immediately available MD    Location  [facility]    Staff Present  Russella Dar, MS, EP, [facility], Exercise Physiologist;Gertha Lichtenberg Zachery Conch, Exercise Physiologist;Debra Wynetta Emery, RN, BSN    Medication changes reported      No    Fall or balance concerns reported     No    Warm-up and Cool-down  Performed as group-led instruction    Resistance Training Performed  Yes    VAD Patient?  No    PAD/SET Patient?  No      Pain Assessment   Currently in Pain?  No/denies    Pain Score  0-No pain    Multiple Pain Sites  No       Capillary Blood Glucose: No results found for this or any previous visit (from the past 24 hour(s)).    Social History   Tobacco Use  Smoking Status Never Smoker  Smokeless Tobacco Never Used    Goals Met:  Independence with exercise equipment Exercise tolerated well No report of cardiac concerns or symptoms Strength training completed today  Goals Unmet:  Not Applicable  Comments: Pt able to follow exercise prescription today without complaint.  Will continue to monitor for progression. Check out 9:15.   Dr. Kate Sable is Medical Director for [facility] Cardiac and Pulmonary Rehab.

## 2018-10-18 ENCOUNTER — Encounter (HOSPITAL_COMMUNITY)
Admission: RE | Admit: 2018-10-18 | Discharge: 2018-10-18 | Disposition: A | Discharge status: Home / Self Care | Payer: Medicare Other | Source: Ambulatory Visit | Attending: Cardiology | Admitting: Cardiology

## 2018-10-18 DIAGNOSIS — I214 Non-ST elevation (NSTEMI) myocardial infarction: Secondary | ICD-10-CM

## 2018-10-18 DIAGNOSIS — Z951 Presence of aortocoronary bypass graft: Secondary | ICD-10-CM

## 2018-10-18 NOTE — Progress Notes (Signed)
Daily Session Note  Patient Details  Name: Alejandro [name] MRN: [id number] Date of Birth: 05/18/1941 Referring Provider:     [place] from 08/13/2018 in [place]  Referring Provider  [name]      Encounter Date: 10/18/2018  Check In: Session Check In - 10/18/18 0815      Check-In   Supervising physician immediately available to respond to emergencies  See telemetry face sheet for immediately available MD    Location  [facility]    Staff Present  Russella Dar, MS, EP, [facility], Exercise Physiologist;Sabiha Sura Zachery Conch, Exercise Physiologist;Debra Wynetta Emery, RN, BSN    Medication changes reported      No    Fall or balance concerns reported     No    Warm-up and Cool-down  Performed as group-led instruction    Resistance Training Performed  Yes    VAD Patient?  No    PAD/SET Patient?  No      Pain Assessment   Currently in Pain?  No/denies    Pain Score  0-No pain    Multiple Pain Sites  No       Capillary Blood Glucose: No results found for this or any previous visit (from the past 24 hour(s)).    Social History   Tobacco Use  Smoking Status Never Smoker  Smokeless Tobacco Never Used    Goals Met:  Independence with exercise equipment Exercise tolerated well No report of cardiac concerns or symptoms Strength training completed today  Goals Unmet:  Not Applicable  Comments: Pt able to follow exercise prescription today without complaint.  Will continue to monitor for progression. Check out 9:15.   Dr. Kate Sable is Medical Director for [facility] Cardiac and Pulmonary Rehab.

## 2018-10-19 DIAGNOSIS — K219 Gastro-esophageal reflux disease without esophagitis: Secondary | ICD-10-CM | POA: Diagnosis not present

## 2018-10-19 DIAGNOSIS — L239 Allergic contact dermatitis, unspecified cause: Secondary | ICD-10-CM | POA: Diagnosis not present

## 2018-10-19 DIAGNOSIS — G4709 Other insomnia: Secondary | ICD-10-CM | POA: Diagnosis not present

## 2018-10-19 DIAGNOSIS — J310 Chronic rhinitis: Secondary | ICD-10-CM | POA: Diagnosis not present

## 2018-10-19 DIAGNOSIS — F419 Anxiety disorder, unspecified: Secondary | ICD-10-CM | POA: Diagnosis not present

## 2018-10-19 DIAGNOSIS — I10 Essential (primary) hypertension: Secondary | ICD-10-CM | POA: Diagnosis not present

## 2018-10-19 DIAGNOSIS — E1169 Type 2 diabetes mellitus with other specified complication: Secondary | ICD-10-CM | POA: Diagnosis not present

## 2018-10-19 DIAGNOSIS — I2510 Atherosclerotic heart disease of native coronary artery without angina pectoris: Secondary | ICD-10-CM | POA: Diagnosis not present

## 2018-10-19 DIAGNOSIS — E782 Mixed hyperlipidemia: Secondary | ICD-10-CM | POA: Diagnosis not present

## 2018-10-20 ENCOUNTER — Encounter (HOSPITAL_COMMUNITY)
Admission: RE | Admit: 2018-10-20 | Discharge: 2018-10-20 | Disposition: A | Discharge status: Home / Self Care | Payer: Medicare Other | Source: Ambulatory Visit | Attending: Cardiology | Admitting: Cardiology

## 2018-10-20 DIAGNOSIS — I214 Non-ST elevation (NSTEMI) myocardial infarction: Secondary | ICD-10-CM | POA: Diagnosis not present

## 2018-10-20 DIAGNOSIS — Z951 Presence of aortocoronary bypass graft: Secondary | ICD-10-CM

## 2018-10-20 NOTE — Progress Notes (Signed)
Daily Session Note  Patient Details  Name: Alejandro [name] MRN: [id number] Date of Birth: [birth year] Referring Provider:     [place] from 08/13/2018 in [place]  Referring Provider  [name]      Encounter Date: 10/20/2018  Check In: Session Check In - 10/20/18 0815      Check-In   Supervising physician immediately available to respond to emergencies  See telemetry face sheet for immediately available MD    Location  [facility]    Staff Present  Benay Pike, Exercise Physiologist;Debra Wynetta Emery, RN, BSN;Other    Medication changes reported      No    Fall or balance concerns reported     No    Warm-up and Cool-down  Performed as group-led instruction    Resistance Training Performed  Yes    VAD Patient?  No    PAD/SET Patient?  No      Pain Assessment   Currently in Pain?  No/denies    Pain Score  0-No pain    Multiple Pain Sites  No       Capillary Blood Glucose: No results found for this or any previous visit (from the past 24 hour(s)).    Social History   Tobacco Use  Smoking Status Never Smoker  Smokeless Tobacco Never Used    Goals Met:  Independence with exercise equipment Exercise tolerated well No report of cardiac concerns or symptoms Strength training completed today  Goals Unmet:  Not Applicable  Comments: Pt able to follow exercise prescription today without complaint.  Will continue to monitor for progression. Check out 9:15.   Dr. Kate Sable is Medical Director for [facility] Cardiac and Pulmonary Rehab.

## 2018-10-22 ENCOUNTER — Encounter (HOSPITAL_COMMUNITY)
Admission: RE | Admit: 2018-10-22 | Discharge: 2018-10-22 | Disposition: A | Discharge status: Home / Self Care | Payer: Medicare Other | Source: Ambulatory Visit | Attending: Cardiology | Admitting: Cardiology

## 2018-10-22 DIAGNOSIS — I214 Non-ST elevation (NSTEMI) myocardial infarction: Secondary | ICD-10-CM

## 2018-10-22 DIAGNOSIS — Z951 Presence of aortocoronary bypass graft: Secondary | ICD-10-CM

## 2018-10-22 NOTE — Progress Notes (Signed)
Daily Session Note  Patient Details  Name: Alejandro [name] MRN: [id number] Date of Birth: 10/27/1941 Referring Provider:     [place] from 08/13/2018 in [place]  Referring Provider  [name]      Encounter Date: 10/22/2018  Check In: Session Check In - 10/22/18 0815      Check-In   Supervising physician immediately available to respond to emergencies  See telemetry face sheet for immediately available MD    Location  [facility]    Staff Present  Russella Dar, MS, EP, [facility], Exercise Physiologist;Samari Gorby Zachery Conch, Exercise Physiologist;Debra Wynetta Emery, RN, BSN    Medication changes reported      No    Fall or balance concerns reported     No    Warm-up and Cool-down  Performed as group-led instruction    Resistance Training Performed  Yes    VAD Patient?  No    PAD/SET Patient?  No      Pain Assessment   Currently in Pain?  No/denies    Pain Score  0-No pain    Multiple Pain Sites  No       Capillary Blood Glucose: No results found for this or any previous visit (from the past 24 hour(s)).    Social History   Tobacco Use  Smoking Status Never Smoker  Smokeless Tobacco Never Used    Goals Met:  Independence with exercise equipment Exercise tolerated well No report of cardiac concerns or symptoms Strength training completed today  Goals Unmet:  Not Applicable  Comments: Pt able to follow exercise prescription today without complaint.  Will continue to monitor for progression. Check out 9:15.   Dr. Kate Sable is Medical Director for [facility] Cardiac and Pulmonary Rehab.

## 2018-10-25 ENCOUNTER — Encounter (HOSPITAL_COMMUNITY)
Admission: RE | Admit: 2018-10-25 | Discharge: 2018-10-25 | Disposition: A | Discharge status: Home / Self Care | Payer: Medicare Other | Source: Ambulatory Visit | Attending: Cardiology | Admitting: Cardiology

## 2018-10-25 DIAGNOSIS — Z951 Presence of aortocoronary bypass graft: Secondary | ICD-10-CM

## 2018-10-25 DIAGNOSIS — I214 Non-ST elevation (NSTEMI) myocardial infarction: Secondary | ICD-10-CM

## 2018-10-25 NOTE — Progress Notes (Signed)
Daily Session Note  Patient Details  Name: NITIN MCKOWEN MRN: [id number] Date of Birth: [birth year] Referring Provider:     [place] from 08/13/2018 in [place]  Referring Provider  [name]      Encounter Date: 10/25/2018  Check In: Session Check In - 10/25/18 0815      Check-In   Supervising physician immediately available to respond to emergencies  See telemetry face sheet for immediately available MD    Location  [facility]    Staff Present  Benay Pike, Exercise Physiologist;Debra Wynetta Emery, RN, BSN    Medication changes reported      No    Fall or balance concerns reported     No    Warm-up and Cool-down  Performed as group-led instruction    Resistance Training Performed  Yes    VAD Patient?  No    PAD/SET Patient?  No      Pain Assessment   Currently in Pain?  No/denies    Pain Score  0-No pain    Multiple Pain Sites  No       Capillary Blood Glucose: No results found for this or any previous visit (from the past 24 hour(s)).    Social History   Tobacco Use  Smoking Status Never Smoker  Smokeless Tobacco Never Used    Goals Met:  Independence with exercise equipment Exercise tolerated well No report of cardiac concerns or symptoms Strength training completed today  Goals Unmet:  Not Applicable  Comments: Pt able to follow exercise prescription today without complaint.  Will continue to monitor for progression. Check out 9:15.   Dr. Kate Sable is Medical Director for [facility] Cardiac and Pulmonary Rehab.

## 2018-10-25 NOTE — Progress Notes (Signed)
Cardiac Individual Treatment Plan  Patient Details  Name: Alejandro [name] MRN: [id number] Date of Birth: 10/29/1941 Referring Provider:     CARDIAC REHAB PHASE II ORIENTATION from 08/13/2018 in [place]  Referring Provider  [place]      Initial Encounter Date:    CARDIAC REHAB PHASE II ORIENTATION from 08/13/2018 in [facility]  Date  08/13/18      Visit Diagnosis: NSTEMI (non-ST elevated myocardial infarction) (HCC)  S/P CABG x 4  Patient's Home Medications on Admission:  Current Outpatient Medications:  .  ALPRAZolam (XANAX) 0.5 MG tablet, Take 0.5 mg by mouth at bedtime as needed for sleep., Disp: , Rfl:  .  aspirin 325 MG EC tablet, Take 1 tablet (325 mg total) by mouth daily., Disp: 30 tablet, Rfl: 0 .  atorvastatin (LIPITOR) 80 MG tablet, TAKE 1 TABLET BY MOUTH EVERY DAY AT 6 PM, Disp: 30 tablet, Rfl: 6 .  DEXILANT 60 MG capsule, TAKE ONE CAPSULE BY MOUTH EVERY DAY., Disp: 30 capsule, Rfl: 5 .  hydrALAZINE (APRESOLINE) 25 MG tablet, Take 1 tablet (25 mg total) by mouth every 8 (eight) hours., Disp: 90 tablet, Rfl: 1 .  metoprolol tartrate (LOPRESSOR) 50 MG tablet, TAKE 1 TABLET BY MOUTH TWICE DAILY, Disp: 180 tablet, Rfl: 2 .  sitaGLIPtan-metformin (JANUMET) 50-1000 MG per tablet, Take 1 tablet by mouth 2 (two) times daily with a meal.  , Disp: , Rfl:   Past Medical History: Past Medical History:  Diagnosis Date  . Aortic insufficiency    a. mild-mod by TEE 05/2018 (no aortic stenosis).  . CAD (coronary artery disease)    a. NSTEMI 05/2018 with multivessel disease -> s/p CABGx4 05/11/18.  . Chronic diastolic CHF (congestive heart failure) (HCC)    a. dx 05/2018.  . CKD (chronic kidney disease), stage III (HCC)   . History of nephrolithiasis 2004  . Hyperlipidemia   . Hypertension   . IBS (irritable colon syndrome)   . Postoperative atrial fibrillation (HCC)   . PVC's (premature ventricular contractions)   . Reflux   . Type  2 diabetes mellitus (HCC)     Tobacco Use: Social History   Tobacco Use  Smoking Status Never Smoker  Smokeless Tobacco Never Used    Labs: Recent Review Flowsheet Data    Labs for ITP Cardiac and Pulmonary Rehab Latest Ref Rng & Units 05/11/2018 05/11/2018 05/12/2018 05/12/2018 05/12/2018   Hemoglobin A1c 4.8 - 5.6 % - - - - -   PHART 7.350 - 7.450 7.326(L) 7.368 7.376 - -   PCO2ART 32.0 - 48.0 mmHg 40.8 38.1 39.5 - -   HCO3 20.0 - 28.0 mmol/L 21.6 21.8 23.1 - -   TCO2 22 - 32 mmol/L 23 23 24 22 21(L)   ACIDBASEDEF 0.0 - 2.0 mmol/L 5.0(H) 3.0(H) 2.0 - -   O2SAT % 97.0 98.0 98.0 - -      Capillary Blood Glucose: Lab Results  Component Value Date   GLUCAP 126 (H) 08/30/2018   GLUCAP 181 (H) 08/23/2018   GLUCAP 231 (H) 08/18/2018   GLUCAP 213 (H) 08/16/2018   GLUCAP 127 (H) 05/17/2018     Exercise Target Goals: Exercise Program Goal: Individual exercise prescription set using results from initial 6 min walk test and THRR while considering  patient's activity barriers and safety.   Exercise Prescription Goal: Starting with aerobic activity 30 plus minutes a day, 3 days per week for initial exercise prescription. Provide home exercise prescription and   guidelines that participant acknowledges understanding prior to discharge.  Activity Barriers & Risk Stratification: Activity Barriers & Cardiac Risk Stratification - 08/13/18 1352      Activity Barriers & Cardiac Risk Stratification   Activity Barriers  Muscular Weakness;Other (comment)    Comments  calf pain     Cardiac Risk Stratification  High       6 Minute Walk: 6 Minute Walk    Row Name 08/13/18 1351         6 Minute Walk   Phase  Initial     Distance  1200 feet     Walk Time  6 minutes     # of Rest Breaks  0     MPH  2.27     METS  2.74     RPE  10     Perceived Dyspnea   9     VO2 Peak  8.68     Symptoms  Yes (comment)     Comments  pain in both calves 6/10, 2/10 after 2 minute rest      Resting HR   66 bpm     Resting BP  156/80     Resting Oxygen Saturation   98 %     Exercise Oxygen Saturation  during 6 min walk  96 %     Max Ex. HR  86 bpm     Max Ex. BP  194/82     2 Minute Post BP  168/78        Oxygen Initial Assessment:   Oxygen Re-Evaluation:   Oxygen Discharge (Final Oxygen Re-Evaluation):   Initial Exercise Prescription: Initial Exercise Prescription - 08/13/18 1400      Date of Initial Exercise RX and Referring Provider   Date  08/13/18    Referring Provider  [name]      NuStep   Level  1    SPM  54    Minutes  17    METs  1.8      Arm Ergometer   Level  1    Watts  12    RPM  55    Minutes  17    METs  1.9      Prescription Details   Frequency (times per week)  3    Duration  Progress to 30 minutes of continuous aerobic without signs/symptoms of physical distress      Intensity   THRR 40-80% of Max Heartrate  97-113-129    Ratings of Perceived Exertion  11-13    Perceived Dyspnea  0-4      Progression   Progression  Continue to progress workloads to maintain intensity without signs/symptoms of physical distress.      Resistance Training   Training Prescription  Yes    Weight  1    Reps  10-15       Perform Capillary Blood Glucose checks as needed.  Exercise Prescription Changes:  Exercise Prescription Changes    Row Name 08/30/18 1400 09/20/18 1500 10/08/18 1500 10/20/18 1400       Response to Exercise   Blood Pressure (Admit)  134/70  140/70  118/74  120/60    Blood Pressure (Exercise)  158/66  142/70  146/76  158/80    Blood Pressure (Exit)  138/72  130/70  124/66  118/68    Heart Rate (Admit)  74 bpm  61 bpm  63 bpm  68 bpm    Heart Rate (Exercise)  96 bpm  80 bpm  74 bpm  94 bpm    Heart Rate (Exit)  81 bpm  70 bpm  68 bpm  77 bpm    Rating of Perceived Exertion (Exercise)  '12  12  11  11    '$ Comments  First full week of exercise   increased overall MET level  -  increase in overall MET level     Duration  Progress to 30  minutes of  aerobic without signs/symptoms of physical distress  Progress to 30 minutes of  aerobic without signs/symptoms of physical distress  Progress to 30 minutes of  aerobic without signs/symptoms of physical distress  Progress to 30 minutes of  aerobic without signs/symptoms of physical distress    Intensity  THRR unchanged  THRR unchanged  THRR unchanged  THRR unchanged      Progression   Progression  Continue to progress workloads to maintain intensity without signs/symptoms of physical distress.  Continue to progress workloads to maintain intensity without signs/symptoms of physical distress.  Continue to progress workloads to maintain intensity without signs/symptoms of physical distress.  Continue to progress workloads to maintain intensity without signs/symptoms of physical distress.    Average METs  2.2  2.75  2.75  3.2      Resistance Training   Training Prescription  Yes  Yes  Yes  Yes    Weight  '2  2  2  2 '$ can't increase due to shoulder pain     Reps  10-15  10-15  10-15  10-15      NuStep   Level  '1  2  3  3    '$ SPM  86  99  97  94    Minutes  '17  17  17  17    '$ METs  2.1  2.5  2.9  2.2      Arm Ergometer   Level  '2  2  3  3    '$ Watts  '16  25  20  29    '$ RPM  52  69  45  58    Minutes  '22  22  22  22    '$ METs  2.3  3  2.6  3.2      Home Exercise Plan   Plans to continue exercise at  Home (comment) walking  Home (comment)  Home (comment)  Home (comment)    Frequency  Add 2 additional days to program exercise sessions.  Add 2 additional days to program exercise sessions.  Add 2 additional days to program exercise sessions.  Add 2 additional days to program exercise sessions.    Initial Home Exercises Provided  08/13/18  08/13/18  08/13/18  08/13/18       Exercise Comments:  Exercise Comments    Row Name 08/16/18 1624 09/06/18 1411 10/01/18 1441 10/25/18 1238     Exercise Comments  Patient wants to get stronger.   Patient wants to get stronger and has recently stated he  would like to sleep better. He works hard everyday in rehab and reports that it is helping him to be able to do more around the house. He cooked himself dinner for the first time since his surgery!   Patient has reported that he is feeling stronger overall. He is still having trouble with his R should which holds him back some, but not much. He has increased to level 3 on the Arm Ergometer. He is happy to be returning to work a  few hours a week as tolerated. Patient reports he is still not sleeping well most nights.   Patient feels as if his stamina has gotten better and he doesn't get as tired when out doing things. His shoulder still bothers him and his incision site has began to be sore but he still pushes to work hard and tolerate any increases with ease.        Exercise Goals and Review:  Exercise Goals    Row Name 08/13/18 1357             Exercise Goals   Increase Physical Activity  Yes       Intervention  Provide advice, education, support and counseling about physical activity/exercise needs.;Develop an individualized exercise prescription for aerobic and resistive training based on initial evaluation findings, risk stratification, comorbidities and participant's personal goals.       Expected Outcomes  Short Term: Attend rehab on a regular basis to increase amount of physical activity.       Increase Strength and Stamina  Yes       Intervention  Provide advice, education, support and counseling about physical activity/exercise needs.;Develop an individualized exercise prescription for aerobic and resistive training based on initial evaluation findings, risk stratification, comorbidities and participant's personal goals.       Expected Outcomes  Short Term: Increase workloads from initial exercise prescription for resistance, speed, and METs.;Short Term: Perform resistance training exercises routinely during rehab and add in resistance training at home;Long Term: Improve cardiorespiratory  fitness, muscular endurance and strength as measured by increased METs and functional capacity (6MWT)       Able to understand and use rate of perceived exertion (RPE) scale  Yes       Intervention  Provide education and explanation on how to use RPE scale       Expected Outcomes  Short Term: Able to use RPE daily in rehab to express subjective intensity level;Long Term:  Able to use RPE to guide intensity level when exercising independently       Knowledge and understanding of Target Heart Rate Range (THRR)  Yes       Intervention  Provide education and explanation of THRR including how the numbers were predicted and where they are located for reference       Expected Outcomes  Short Term: Able to state/look up THRR;Long Term: Able to use THRR to govern intensity when exercising independently;Short Term: Able to use daily as guideline for intensity in rehab       Able to check pulse independently  Yes       Intervention  Provide education and demonstration on how to check pulse in carotid and radial arteries.;Review the importance of being able to check your own pulse for safety during independent exercise       Expected Outcomes  Short Term: Able to explain why pulse checking is important during independent exercise;Long Term: Able to check pulse independently and accurately       Understanding of Exercise Prescription  Yes       Intervention  Provide education, explanation, and written materials on patient's individual exercise prescription       Expected Outcomes  Short Term: Able to explain program exercise prescription;Long Term: Able to explain home exercise prescription to exercise independently          Exercise Goals Re-Evaluation : Exercise Goals Re-Evaluation    Row Name 08/16/18 1623 09/06/18 1409 10/01/18 1433 10/25/18 1237  Exercise Goal Re-Evaluation   Exercise Goals Review  Increase Physical Activity;Increase Strength and Stamina;Able to understand and use rate of  perceived exertion (RPE) scale;Knowledge and understanding of Target Heart Rate Range (THRR);Able to check pulse independently;Understanding of Exercise Prescription  Increase Physical Activity;Increase Strength and Stamina;Able to understand and use rate of perceived exertion (RPE) scale;Knowledge and understanding of Target Heart Rate Range (THRR);Able to check pulse independently;Understanding of Exercise Prescription  Increase Physical Activity;Increase Strength and Stamina;Able to understand and use rate of perceived exertion (RPE) scale;Knowledge and understanding of Target Heart Rate Range (THRR);Able to check pulse independently;Understanding of Exercise Prescription  Increase Physical Activity;Increase Strength and Stamina;Able to understand and use rate of perceived exertion (RPE) scale;Knowledge and understanding of Target Heart Rate Range (THRR);Able to check pulse independently;Understanding of Exercise Prescription    Comments  Patient just started program. has completed 2 visits. Will continue to monitor progress.   Patient has done well in the program. He has stated that he feels stronger and is getting better. He has progressed to level 2 on both pieces of equipment (NuStep and Arm Ergometer). His R shoulder bothers him so he has not progressed on weights. We will continue to monitor his progress.   Patient continues to gain strength overall. He has been able to return to work some but would still like to return full time after the program. Pateint still has pain in his R shoulder. He has increased his overall MET level. We will continue to progress him as tolerable.   Patient is happy he has been able to at least go to work, even if he isn't able to lift or do manual work yet. He feels stronger but still has trouble with his R shoulder, and his incision site is now sore/..     Expected Outcomes  Gain strength  Gain strength to get back to work.   Increase strength and stamina to get back to work  full time.   Increase strength and stamina to get back to work full time.         Discharge Exercise Prescription (Final Exercise Prescription Changes): Exercise Prescription Changes - 10/20/18 1400      Response to Exercise   Blood Pressure (Admit)  120/60    Blood Pressure (Exercise)  158/80    Blood Pressure (Exit)  118/68    Heart Rate (Admit)  68 bpm    Heart Rate (Exercise)  94 bpm    Heart Rate (Exit)  77 bpm    Rating of Perceived Exertion (Exercise)  11    Comments  increase in overall MET level     Duration  Progress to 30 minutes of  aerobic without signs/symptoms of physical distress    Intensity  THRR unchanged      Progression   Progression  Continue to progress workloads to maintain intensity without signs/symptoms of physical distress.    Average METs  3.2      Resistance Training   Training Prescription  Yes    Weight  2   can't increase due to shoulder pain    Reps  10-15      NuStep   Level  3    SPM  94    Minutes  17    METs  2.2      Arm Ergometer   Level  3    Watts  29    RPM  58    Minutes  22    METs  3.2  Home Exercise Plan   Plans to continue exercise at  Home (comment)    Frequency  Add 2 additional days to program exercise sessions.    Initial Home Exercises Provided  08/13/18       Nutrition:  Target Goals: Understanding of nutrition guidelines, daily intake of sodium '1500mg'$ , cholesterol '200mg'$ , calories 30% from fat and 7% or less from saturated fats, daily to have 5 or more servings of fruits and vegetables.  Biometrics: Pre Biometrics - 08/13/18 1353      Pre Biometrics   Height  '5\' 11"'$  (1.803 m)    Waist Circumference  42 inches    Hip Circumference  38.5 inches    Waist to Hip Ratio  1.09 %    Triceps Skinfold  6 mm    % Body Fat  26.4 %    Grip Strength  25.1 kg    Flexibility  14.3 in    Single Leg Stand  2 seconds        Nutrition Therapy Plan and Nutrition Goals: Nutrition Therapy & Goals - 10/25/18  1437      Nutrition Therapy   RD appointment deferred  Yes      Personal Nutrition Goals   Comments  Patient continues to say he is trying to eat less carbs and sweets and heart healthy. Will continue to monitor for progress.       Intervention Plan   Intervention  Nutrition handout(s) given to patient.       Nutrition Assessments: Nutrition Assessments - 08/13/18 1514      MEDFICTS Scores   Pre Score  12       Nutrition Goals Re-Evaluation:   Nutrition Goals Discharge (Final Nutrition Goals Re-Evaluation):   Psychosocial: Target Goals: Acknowledge presence or absence of significant depression and/or stress, maximize coping skills, provide positive support system. Participant is able to verbalize types and ability to use techniques and skills needed for reducing stress and depression.  Initial Review & Psychosocial Screening: Initial Psych Review & Screening - 08/13/18 1512      Initial Review   Current issues with  None Identified      Family Dynamics   Good Support System?  Yes      Barriers   Psychosocial barriers to participate in program  There are no identifiable barriers or psychosocial needs.      Screening Interventions   Interventions  Encouraged to exercise    Expected Outcomes  Short Term goal: Identification and review with participant of any Quality of Life or Depression concerns found by scoring the questionnaire.;Long Term goal: The participant improves quality of Life and PHQ9 Scores as seen by post scores and/or verbalization of changes       Quality of Life Scores: Quality of Life - 08/13/18 1358      Quality of Life   Select  Quality of Life      Quality of Life Scores   Health/Function Pre  28.68 %    Socioeconomic Pre  30 %    Psych/Spiritual Pre  30 %    Family Pre  19.5 %    GLOBAL Pre  28.44 %      Scores of 19 and below usually indicate a poorer quality of life in these areas.  A difference of  2-3 points is a clinically  meaningful difference.  A difference of 2-3 points in the total score of the Quality of Life Index has been associated with significant improvement in overall  quality of life, self-image, physical symptoms, and general health in studies assessing change in quality of life.  PHQ-9: Recent Review Flowsheet Data    Depression screen [facility] 2/9 09/09/2018 08/13/2018 06/22/2018 05/27/2018   Decreased Interest 0 0 0 0   Down, Depressed, Hopeless 0 0 0 1    PHQ - 2 Score 0 0 0 1   Altered sleeping - 2 - -   Tired, decreased energy - 0 - -   Change in appetite - 0 - -   Feeling bad or failure about yourself  - 0 - -   Trouble concentrating - 0 - -   Moving slowly or fidgety/restless - 0 - -   Suicidal thoughts - 0 - -   PHQ-9 Score - 2 - -   Difficult doing work/chores - Somewhat difficult - -     Interpretation of Total Score  Total Score Depression Severity:  1-4 = Minimal depression, 5-9 = Mild depression, 10-14 = Moderate depression, 15-19 = Moderately severe depression, 20-27 = Severe depression   Psychosocial Evaluation and Intervention: Psychosocial Evaluation - 08/13/18 1513      Psychosocial Evaluation & Interventions   Interventions  Encouraged to exercise with the program and follow exercise prescription    Continue Psychosocial Services   No Follow up required       Psychosocial Re-Evaluation: Psychosocial Re-Evaluation    Row Name 09/06/18 1431 10/04/18 1431 10/25/18 1442         Psychosocial Re-Evaluation   Current issues with  Current Sleep Concerns  Current Sleep Concerns  Current Sleep Concerns     Comments  Patient initial QOL score was 28.44 and his PHQ-9 score was 2 with no psychosocial issues identified. He says he has difficulty going to sleep at times and hopes this will resolve on his own.   Patient initial QOL score was 28.44 and his PHQ-9 score was 2 with no psychosocial issues identified. His sleep has improved some since last 30 day review. Will continue to  monitor.   Patient initial QOL score was 28.44 and his PHQ-9 score was 2 with no psychosocial issues identified. His sleep continues to improve slowly. Will continue to monitor.      Expected Outcomes  Patient will have no psychosocial issues identified at discharge.   Patient will have no psychosocial issues identified at discharge.   Patient will have no psychosocial issues identified at discharge.      Interventions  Stress management education;Encouraged to attend Cardiac Rehabilitation for the exercise;Relaxation education  Stress management education;Encouraged to attend Cardiac Rehabilitation for the exercise;Relaxation education  Stress management education;Encouraged to attend Cardiac Rehabilitation for the exercise;Relaxation education     Continue Psychosocial Services   No Follow up required  No Follow up required  No Follow up required        Psychosocial Discharge (Final Psychosocial Re-Evaluation): Psychosocial Re-Evaluation - 10/25/18 1442      Psychosocial Re-Evaluation   Current issues with  Current Sleep Concerns    Comments  Patient initial QOL score was 28.44 and his PHQ-9 score was 2 with no psychosocial issues identified. His sleep continues to improve slowly. Will continue to monitor.     Expected Outcomes  Patient will have no psychosocial issues identified at discharge.     Interventions  Stress management education;Encouraged to attend Cardiac Rehabilitation for the exercise;Relaxation education    Continue Psychosocial Services   No Follow up required       Vocational Rehabilitation:  Provide vocational rehab assistance to qualifying candidates.   Vocational Rehab Evaluation & Intervention: Vocational Rehab - 08/13/18 1516      Initial Vocational Rehab Evaluation & Intervention   Assessment shows need for Vocational Rehabilitation  No       Education: Education Goals: Education classes will be provided on a weekly basis, covering required topics. Participant  will state understanding/return demonstration of topics presented.  Learning Barriers/Preferences: Learning Barriers/Preferences - 08/13/18 1515      Learning Barriers/Preferences   Learning Barriers  None    Learning Preferences  Audio;Pictoral;Video       Education Topics: Hypertension, Hypertension Reduction -Define heart disease and high blood pressure. Discus how high blood pressure affects the body and ways to reduce high blood pressure.   CARDIAC REHAB PHASE II EXERCISE from 10/20/2018 in [place]  Date  10/06/18  Educator  D. Coad  Instruction Review Code  2- Demonstrated Understanding      Exercise and Your Heart -Discuss why it is important to exercise, the FITT principles of exercise, normal and abnormal responses to exercise, and how to exercise safely.   CARDIAC REHAB PHASE II EXERCISE from 10/20/2018 in [place]  Date  10/13/18  Educator  Coad  Instruction Review Code  2- Demonstrated Understanding      Angina -Discuss definition of angina, causes of angina, treatment of angina, and how to decrease risk of having angina.   CARDIAC REHAB PHASE II EXERCISE from 10/20/2018 in [place]  Date  10/20/18  Educator  Etheleen Mayhew   Instruction Review Code  2- Demonstrated Understanding      Cardiac Medications -Review what the following cardiac medications are used for, how they affect the body, and side effects that may occur when taking the medications.  Medications include Aspirin, Beta blockers, calcium channel blockers, ACE Inhibitors, angiotensin receptor blockers, diuretics, digoxin, and antihyperlipidemics.   Congestive Heart Failure -Discuss the definition of CHF, how to live with CHF, the signs and symptoms of CHF, and how keep track of weight and sodium intake.   Heart Disease and Intimacy -Discus the effect sexual activity has on the heart, how changes occur during intimacy as we  age, and safety during sexual activity.   Smoking Cessation / COPD -Discuss different methods to quit smoking, the health benefits of quitting smoking, and the definition of COPD.   CARDIAC REHAB PHASE II EXERCISE from 10/20/2018 in [place]  Date  08/18/18  Educator  Etheleen Mayhew  Instruction Review Code  2- Demonstrated Understanding      Nutrition I: Fats -Discuss the types of cholesterol, what cholesterol does to the heart, and how cholesterol levels can be controlled.   CARDIAC REHAB PHASE II EXERCISE from 10/20/2018 in [place]  Date  08/25/18  Educator  D. Coad  Instruction Review Code  2- Demonstrated Understanding      Nutrition II: Labels -Discuss the different components of food labels and how to read food label   [place] from 10/20/2018 in [place]  Date  09/01/18  Educator  Etheleen Mayhew  Instruction Review Code  2- Demonstrated Understanding      Heart Parts/Heart Disease and PAD -Discuss the anatomy of the heart, the pathway of blood circulation through the heart, and these are affected by heart disease.   CARDIAC REHAB PHASE II EXERCISE from 10/20/2018 in [place]  Date  09/08/18  Educator  Etheleen Mayhew  Instruction Review Code  2- Demonstrated Understanding      Stress I: Signs and Symptoms -Discuss the causes of stress, how stress may lead to anxiety and depression, and ways to limit stress.   CARDIAC REHAB PHASE II EXERCISE from 10/20/2018 in [place]  Date  09/15/18  Educator  D.Coad  Instruction Review Code  2- Demonstrated Understanding      Stress II: Relaxation -Discuss different types of relaxation techniques to limit stress.   CARDIAC REHAB PHASE II EXERCISE from 10/20/2018 in [place]  Date  09/22/18  Educator  D. Coad  Instruction Review Code  2- Demonstrated Understanding       Warning Signs of Stroke / TIA -Discuss definition of a stroke, what the signs and symptoms are of a stroke, and how to identify when someone is having stroke.   CARDIAC REHAB PHASE II EXERCISE from 10/20/2018 in [place]  Date  09/29/18  Educator  Etheleen Mayhew  Instruction Review Code  2- Demonstrated Understanding      Knowledge Questionnaire Score: Knowledge Questionnaire Score - 08/13/18 1516      Knowledge Questionnaire Score   Pre Score  16/24       Core Components/Risk Factors/Patient Goals at Admission: Personal Goals and Risk Factors at Admission - 08/13/18 1516      Core Components/Risk Factors/Patient Goals on Admission    Weight Management  Weight Maintenance    Personal Goal Other  Yes    Personal Goal  Get stronger    Intervention  Attend CR 3xweek and supplement 2 x week with home exercise    Expected Outcomes  Reach personal goals.       Core Components/Risk Factors/Patient Goals Review:  Goals and Risk Factor Review    Row Name 09/06/18 1427 10/04/18 1428 10/25/18 1437         Core Components/Risk Factors/Patient Goals Review   Personal Goals Review  Weight Management/Obesity;Diabetes Gain strength; continue to gain strength.   Weight Management/Obesity;Diabetes Gain strength; continue to gain strength.   Weight Management/Obesity;Diabetes Gain strength; continue to gain strength.      Review  Patient has completed 10 sessions losing 2 lbs since his initial visit. He is doing well in the program with progression. He says he feels better overall and does feel stronger. His doctor will not release him to return to work until he complete the program. His reported glucose readings have improved since he started the program. His last Hgb A1C was on 05/30/18 at 6.0. He says this will be repeated this month. He is very anxious to go back to work. He says he gets bored around the house but is now feeling like doing more around the house. He  cooked dinner recently for the first time since his event. Will continue to monitor for progress.   Patient has completed 20 sessions maintaining his weight since last 30 day review. He continues to do well in the program with progression. He says the program is helping him be more active and he feels stronger and better overall. 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 His fasting glucose readings have improved. No recent A1C's on file. He says he is continues to try and limit his carbs. Will continue to monitor for progress.   Patient has completed 29 sessions gaining 1 lb since last 30 day review. He continues to do well in  the program with progression. He continues to say the program has helped him more than he ever thought it would. He feels stronger and has more stamina to do more activities. He is awaiting clearance from his cardiologist to go back to work full time. He says he feels strong enough and ready. His right shoulder continues to bother him some. His PCP discontinued his diabetic medication. His fasting glucose today was 123 mg/dl. Will continue to monitor for progress.      Expected Outcomes  Patient will continue to attend sessions and complete the program meeting his personal goals.   Patient will continue to attend sessions and complete the program meeting his personal goals.   Patient will continue to attend sessions and complete the program meeting his personal goals.         Core Components/Risk Factors/Patient Goals at Discharge (Final Review):  Goals and Risk Factor Review - 10/25/18 1437      Core Components/Risk Factors/Patient Goals Review   Personal Goals Review  Weight Management/Obesity;Diabetes   Gain strength; continue to gain strength.    Review  Patient has completed 29 sessions gaining 1 lb since last 30 day review. He continues to do well in the program with progression. He continues to say the program has helped him more than he ever  thought it would. He feels stronger and has more stamina to do more activities. He is awaiting clearance from his cardiologist to go back to work full time. He says he feels strong enough and ready. His right shoulder continues to bother him some. His PCP discontinued his diabetic medication. His fasting glucose today was 123 mg/dl. Will continue to monitor for progress.     Expected Outcomes  Patient will continue to attend sessions and complete the program meeting his personal goals.        ITP Comments: ITP Comments    Row Name 08/19/18 1019           ITP Comments  Patient new to program completing 3 sessions. Will continue to monitor for progress.           Comments: ITP 30 Day REVIEW Patient doing well in the program. Will continue to monitor for progress.

## 2018-10-27 ENCOUNTER — Encounter (HOSPITAL_COMMUNITY)
Admission: RE | Admit: 2018-10-27 | Discharge: 2018-10-27 | Disposition: A | Discharge status: Home / Self Care | Payer: Medicare Other | Source: Ambulatory Visit | Attending: Cardiology | Admitting: Cardiology

## 2018-10-27 DIAGNOSIS — I214 Non-ST elevation (NSTEMI) myocardial infarction: Secondary | ICD-10-CM

## 2018-10-27 DIAGNOSIS — Z951 Presence of aortocoronary bypass graft: Secondary | ICD-10-CM

## 2018-10-27 NOTE — Progress Notes (Signed)
Daily Session Note  Patient Details  Name: Alejandro [name] MRN: [id number] Date of Birth: 1940/12/11 Referring Provider:     [place] from 08/13/2018 in [place]  Referring Provider  [name]      Encounter Date: 10/27/2018  Check In: Session Check In - 10/27/18 0815      Check-In   Supervising physician immediately available to respond to emergencies  See telemetry face sheet for immediately available MD    Location  [facility]    Staff Present  Benay Pike, Exercise Physiologist;Debra Wynetta Emery, RN, BSN;Diane Coad, MS, EP, [facility], Exercise Physiologist    Medication changes reported      No    Fall or balance concerns reported     No    Warm-up and Cool-down  Performed as group-led instruction    Resistance Training Performed  Yes    VAD Patient?  No    PAD/SET Patient?  No      Pain Assessment   Currently in Pain?  No/denies    Pain Score  0-No pain    Multiple Pain Sites  No       Capillary Blood Glucose: No results found for this or any previous visit (from the past 24 hour(s)).    Social History   Tobacco Use  Smoking Status Never Smoker  Smokeless Tobacco Never Used    Goals Met:  Independence with exercise equipment Exercise tolerated well No report of cardiac concerns or symptoms Strength training completed today  Goals Unmet:  Not Applicable  Comments: Pt able to follow exercise prescription today without complaint.  Will continue to monitor for progression. Check out 9:15.   Dr. Kate Sable is Medical Director for [facility] Cardiac and Pulmonary Rehab.

## 2018-10-29 ENCOUNTER — Encounter (HOSPITAL_COMMUNITY): Payer: Medicare Other

## 2018-11-01 ENCOUNTER — Encounter (HOSPITAL_COMMUNITY)
Admission: RE | Admit: 2018-11-01 | Discharge: 2018-11-01 | Disposition: A | Discharge status: Home / Self Care | Payer: Medicare Other | Source: Ambulatory Visit | Attending: Cardiology | Admitting: Cardiology

## 2018-11-01 VITALS — Ht 71.0 in | Wt 235.5 lb

## 2018-11-01 DIAGNOSIS — Z951 Presence of aortocoronary bypass graft: Secondary | ICD-10-CM | POA: Insufficient documentation

## 2018-11-01 DIAGNOSIS — I214 Non-ST elevation (NSTEMI) myocardial infarction: Secondary | ICD-10-CM | POA: Diagnosis not present

## 2018-11-01 NOTE — Progress Notes (Signed)
Daily Session Note  Patient Details  Name: Alejandro [name] MRN: [id number] Date of Birth: July 15, 1941 Referring Provider:     [place] from 08/13/2018 in [place]  Referring Provider  [name]      Encounter Date: 11/01/2018  Check In: Session Check In - 11/01/18 0815      Check-In   Supervising physician immediately available to respond to emergencies  See telemetry face sheet for immediately available MD    Location  [facility]    Staff Present  Benay Pike, Exercise Physiologist;Debra Wynetta Emery, RN, BSN    Medication changes reported      No    Fall or balance concerns reported     No    Warm-up and Cool-down  Performed as group-led instruction    Resistance Training Performed  Yes    VAD Patient?  No    PAD/SET Patient?  No      Pain Assessment   Currently in Pain?  No/denies    Pain Score  0-No pain    Multiple Pain Sites  No       Capillary Blood Glucose: No results found for this or any previous visit (from the past 24 hour(s)).    Social History   Tobacco Use  Smoking Status Never Smoker  Smokeless Tobacco Never Used    Goals Met:  Independence with exercise equipment Exercise tolerated well No report of cardiac concerns or symptoms Strength training completed today  Goals Unmet:  Not Applicable  Comments: Pt able to follow exercise prescription today without complaint.  Will continue to monitor for progression. Check out 9:15.   Dr. Kate Sable is Medical Director for [facility] Cardiac and Pulmonary Rehab.

## 2018-11-03 ENCOUNTER — Encounter (HOSPITAL_COMMUNITY)
Admission: RE | Admit: 2018-11-03 | Discharge: 2018-11-03 | Disposition: A | Discharge status: Home / Self Care | Payer: Medicare Other | Source: Ambulatory Visit | Attending: Cardiology | Admitting: Cardiology

## 2018-11-03 DIAGNOSIS — Z951 Presence of aortocoronary bypass graft: Secondary | ICD-10-CM | POA: Diagnosis not present

## 2018-11-03 DIAGNOSIS — I214 Non-ST elevation (NSTEMI) myocardial infarction: Secondary | ICD-10-CM

## 2018-11-03 NOTE — Progress Notes (Signed)
Daily Session Note  Patient Details  Name: Alejandro [name] MRN: [id number] Date of Birth: [birth year] Referring Provider:     [place] from 08/13/2018 in [place]  Referring Provider  [name]      Encounter Date: 11/03/2018  Check In: Session Check In - 11/03/18 0815      Check-In   Supervising physician immediately available to respond to emergencies  See telemetry face sheet for immediately available MD    Location  [facility]    Staff Present  Benay Pike, Exercise Physiologist;Halsey Hammen Wynetta Emery, RN, BSN    Fall or balance concerns reported     No    Warm-up and Cool-down  Performed as group-led Higher education careers adviser Performed  Yes    VAD Patient?  No    PAD/SET Patient?  No      Pain Assessment   Currently in Pain?  No/denies    Pain Score  0-No pain    Multiple Pain Sites  No       Capillary Blood Glucose: No results found for this or any previous visit (from the past 24 hour(s)).    Social History   Tobacco Use  Smoking Status Never Smoker  Smokeless Tobacco Never Used    Goals Met:  Independence with exercise equipment Exercise tolerated well No report of cardiac concerns or symptoms Strength training completed today  Goals Unmet:  Not Applicable  Comments: Pt able to follow exercise prescription today without complaint.  Will continue to monitor for progression. Check out 915.   Dr. Kate Sable is Medical Director for [facility] Cardiac and Pulmonary Rehab.

## 2018-11-05 ENCOUNTER — Encounter (HOSPITAL_COMMUNITY)
Admission: RE | Admit: 2018-11-05 | Discharge: 2018-11-05 | Disposition: A | Discharge status: Home / Self Care | Payer: Medicare Other | Source: Ambulatory Visit | Attending: Cardiology | Admitting: Cardiology

## 2018-11-05 DIAGNOSIS — Z951 Presence of aortocoronary bypass graft: Secondary | ICD-10-CM

## 2018-11-05 DIAGNOSIS — I214 Non-ST elevation (NSTEMI) myocardial infarction: Secondary | ICD-10-CM

## 2018-11-05 NOTE — Progress Notes (Signed)
Daily Session Note  Patient Details  Name: Alejandro [name] MRN: [id number] Date of Birth: 1941-08-18 Referring Provider:     [place] from 08/13/2018 in [place]  Referring Provider  [name]      Encounter Date: 11/05/2018  Check In: Session Check In - 11/05/18 0815      Check-In   Supervising physician immediately available to respond to emergencies  See telemetry face sheet for immediately available MD    Location  [facility]    Staff Present  Benay Pike, Exercise Physiologist;Debra Wynetta Emery, RN, BSN    Medication changes reported      No    Fall or balance concerns reported     No    Warm-up and Cool-down  Performed as group-led instruction    Resistance Training Performed  Yes    VAD Patient?  No    PAD/SET Patient?  No      Pain Assessment   Currently in Pain?  No/denies    Pain Score  0-No pain    Multiple Pain Sites  No       Capillary Blood Glucose: No results found for this or any previous visit (from the past 24 hour(s)).    Social History   Tobacco Use  Smoking Status Never Smoker  Smokeless Tobacco Never Used    Goals Met:  Independence with exercise equipment Exercise tolerated well No report of cardiac concerns or symptoms Strength training completed today  Goals Unmet:  Not Applicable  Comments: Pt able to follow exercise prescription today without complaint.  Will continue to monitor for progression. Check out 9:15.   Dr. Kate Sable is Medical Director for [facility] Cardiac and Pulmonary Rehab.

## 2018-11-08 ENCOUNTER — Encounter (HOSPITAL_COMMUNITY)
Admission: RE | Admit: 2018-11-08 | Discharge: 2018-11-08 | Disposition: A | Discharge status: Home / Self Care | Payer: Medicare Other | Source: Ambulatory Visit | Attending: Cardiology | Admitting: Cardiology

## 2018-11-08 DIAGNOSIS — I214 Non-ST elevation (NSTEMI) myocardial infarction: Secondary | ICD-10-CM | POA: Diagnosis not present

## 2018-11-08 DIAGNOSIS — Z951 Presence of aortocoronary bypass graft: Secondary | ICD-10-CM | POA: Diagnosis not present

## 2018-11-08 NOTE — Progress Notes (Signed)
Daily Session Note  Patient Details  Name: Alejandro [name] MRN: [id number] Date of Birth: [birth year] Referring Provider:     [place] from 08/13/2018 in [place]  Referring Provider  [name]      Encounter Date: 11/08/2018  Check In: Session Check In - 11/08/18 0815      Check-In   Supervising physician immediately available to respond to emergencies  See telemetry face sheet for immediately available MD    Location  [facility]    Staff Present  Benay Pike, Exercise Physiologist;Tillman Kazmierski Wynetta Emery, RN, BSN    Medication changes reported      No    Fall or balance concerns reported     No    Warm-up and Cool-down  Performed as group-led instruction    Resistance Training Performed  Yes    VAD Patient?  No    PAD/SET Patient?  No      Pain Assessment   Currently in Pain?  Yes    Pain Score  8     Pain Location  Knee    Pain Orientation  Right    Pain Descriptors / Indicators  Aching    Pain Radiating Towards  No radiation.    Pain Onset  Today    Pain Frequency  Intermittent    Aggravating Factors   Knee only hurts with weight bearing.     Pain Relieving Factors  Sit down.     Effect of Pain on Daily Activities  None.     Multiple Pain Sites  No       Capillary Blood Glucose: No results found for this or any previous visit (from the past 24 hour(s)).    Social History   Tobacco Use  Smoking Status Never Smoker  Smokeless Tobacco Never Used    Goals Met:  Independence with exercise equipment Exercise tolerated well No report of cardiac concerns or symptoms Strength training completed today  Goals Unmet:  Not Applicable  Comments: Pt able to follow exercise prescription today without complaint.  Will continue to monitor for progression. Check out 915.   Dr. Kate Sable is Medical Director for [facility] Cardiac and Pulmonary Rehab.

## 2018-11-10 ENCOUNTER — Encounter: Payer: Self-pay | Admitting: Cardiology

## 2018-11-10 ENCOUNTER — Telehealth: Payer: Self-pay | Admitting: Cardiology

## 2018-11-10 ENCOUNTER — Encounter: Payer: Self-pay | Admitting: *Deleted

## 2018-11-10 ENCOUNTER — Ambulatory Visit (INDEPENDENT_AMBULATORY_CARE_PROVIDER_SITE_OTHER): Payer: Medicare Other | Admitting: Cardiology

## 2018-11-10 ENCOUNTER — Encounter (HOSPITAL_COMMUNITY)
Admission: RE | Admit: 2018-11-10 | Discharge: 2018-11-10 | Disposition: A | Discharge status: Home / Self Care | Payer: Medicare Other | Source: Ambulatory Visit | Attending: Cardiology | Admitting: Cardiology

## 2018-11-10 VITALS — BP 148/78 | HR 66 | Ht 71.0 in | Wt 236.0 lb

## 2018-11-10 DIAGNOSIS — Z951 Presence of aortocoronary bypass graft: Secondary | ICD-10-CM

## 2018-11-10 DIAGNOSIS — I214 Non-ST elevation (NSTEMI) myocardial infarction: Secondary | ICD-10-CM

## 2018-11-10 DIAGNOSIS — I25119 Atherosclerotic heart disease of native coronary artery with unspecified angina pectoris: Secondary | ICD-10-CM

## 2018-11-10 DIAGNOSIS — N183 Chronic kidney disease, stage 3 unspecified: Secondary | ICD-10-CM

## 2018-11-10 DIAGNOSIS — I10 Essential (primary) hypertension: Secondary | ICD-10-CM

## 2018-11-10 DIAGNOSIS — I2510 Atherosclerotic heart disease of native coronary artery without angina pectoris: Secondary | ICD-10-CM

## 2018-11-10 DIAGNOSIS — E782 Mixed hyperlipidemia: Secondary | ICD-10-CM | POA: Diagnosis not present

## 2018-11-10 NOTE — Patient Instructions (Signed)
Medication Instructions:  Your physician recommends that you continue on your current medications as directed. Please refer to the Current Medication list given to you today.  If you need a refill on your cardiac medications before your next appointment, please call your pharmacy.   Lab work: NONE   If you have labs (blood work) drawn today and your tests are completely normal, you will receive your results only by: . MyChart Message (if you have MyChart) OR . A paper copy in the mail If you have any lab test that is abnormal or we need to change your treatment, we will call you to review the results.  Testing/Procedures: NONE   Follow-Up: At [facility], you and your health needs are our priority.  As part of our continuing mission to provide you with exceptional heart care, we have created designated Provider Care Teams.  These Care Teams include your primary Cardiologist (physician) and Advanced Practice Providers (APPs -  Physician Assistants and Nurse Practitioners) who all work together to provide you with the care you need, when you need it. You will need a follow up appointment in 6 months.  Please call our office 2 months in advance to schedule this appointment.  You may see Samuel McDowell, MD or one of the following Advanced Practice Providers on your designated Care Team:   Brittany Strader, PA-C ([place] Office) . Michele Lenze, PA-C ([place] Office)  Any Other Special Instructions Will Be Listed Below (If Applicable). Thank you for choosing [facility]!     

## 2018-11-10 NOTE — Progress Notes (Signed)
Daily Session Note  Patient Details  Name: Alejandro [name] MRN: [id number] Date of Birth: 1941-09-10 Referring Provider:     [place] from 08/13/2018 in [place]  Referring Provider  [name]      Encounter Date: 11/10/2018  Check In: Session Check In - 11/10/18 0815      Check-In   Supervising physician immediately available to respond to emergencies  See telemetry face sheet for immediately available MD    Location  [facility]    Staff Present  Benay Pike, Exercise Physiologist;Debra Wynetta Emery, RN, BSN;Other    Medication changes reported      No    Fall or balance concerns reported     No    Warm-up and Cool-down  Performed as group-led instruction    Resistance Training Performed  Yes    VAD Patient?  No    PAD/SET Patient?  No      Pain Assessment   Currently in Pain?  No/denies    Pain Score  0-No pain    Multiple Pain Sites  No       Capillary Blood Glucose: No results found for this or any previous visit (from the past 24 hour(s)).    Social History   Tobacco Use  Smoking Status Never Smoker  Smokeless Tobacco Never Used    Goals Met:  Independence with exercise equipment Exercise tolerated well No report of cardiac concerns or symptoms Strength training completed today  Goals Unmet:  Not Applicable  Comments: Pt able to follow exercise prescription today without complaint.  Will continue to monitor for progression. Check out 915.   Dr. Kate Sable is Medical Director for [facility] Cardiac and Pulmonary Rehab.

## 2018-11-10 NOTE — Telephone Encounter (Signed)
Wants to know if it's ok to take Tylenol  Please call [phone]

## 2018-11-10 NOTE — Telephone Encounter (Signed)
Called pt. Advised that Tylenol was ok in moderation, but do not take it daily.

## 2018-11-10 NOTE — Progress Notes (Signed)
Cardiology Office Note  Date: 11/10/2018   ID: Alejandro [name], DOB 05/10/1941, MRN [id number]  PCP: Celene Squibb, MD  Primary Cardiologist: Rozann Lesches, MD   Chief Complaint  Patient presents with  . Coronary Artery Disease    History of Present Illness: Alejandro [name] is a 77 y.o. male last seen in August.  He is here with his daughter for a follow-up visit.  Reports no angina symptoms and NYHA class II dyspnea, no palpitations. He has been participating in cardiac rehabilitation and will complete the program later this week.  He does have some residual thoracic soreness after surgery, improving slowly.  Has also had a rash on his right lower leg, looks to be possibly fungal.  I reviewed his medications which are outlined below.  He reports compliance and no intolerances to current cardiac regimen.  Requesting last lipid panel from Dr. Nevada Crane.  He previously worked in Biomedical scientist, owns his own business.  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  He understands to still observe lifting restrictions.  Past Medical History:  Diagnosis Date  . Aortic insufficiency    a. mild-mod by TEE 05/2018 (no aortic stenosis).  Marland Kitchen CAD (coronary artery disease)    a. NSTEMI 05/2018 with multivessel disease -> s/p CABGx4 05/11/18.  Marland Kitchen Chronic diastolic CHF (congestive heart failure) ([place])    a. dx 05/2018.  . CKD (chronic kidney disease), stage III ([place])   . History of nephrolithiasis 2004  . Hyperlipidemia   . Hypertension   . IBS (irritable colon syndrome)   . Postoperative atrial fibrillation ([place])   . PVC's (premature ventricular contractions)   . Reflux   . Type 2 diabetes mellitus ([place])     Past Surgical History:  Procedure Laterality Date  . CORONARY ARTERY BYPASS GRAFT N/A 05/11/2018   Procedure: CORONARY ARTERY BYPASS GRAFTING (CABG), on pump, times four, using left internal mammary artery and endoscopically harvested right greater saphenous leg  vein.;  Surgeon: Ivin Poot, MD;  Location: [facility];  Service: Open Heart Surgery;  Laterality: N/A;  . ESOPHAGOGASTRODUODENOSCOPY  02/04/2012   Procedure: ESOPHAGOGASTRODUODENOSCOPY (EGD);  Surgeon: Rogene Houston, MD;  [facility];  Service: Endoscopy;  Laterality: N/A;  100  . LEFT HEART CATH AND CORONARY ANGIOGRAPHY N/A 05/04/2018   Procedure: LEFT HEART CATH AND CORONARY ANGIOGRAPHY;  Surgeon: Belva Crome, MD;  Location: [facility];  Service: Cardiovascular;  Laterality: N/A;  . NASAL FRACTURE SURGERY    . TEE WITHOUT CARDIOVERSION N/A 05/11/2018   Procedure: TRANSESOPHAGEAL ECHOCARDIOGRAM (TEE);  Surgeon: Prescott Gum, Collier Salina, MD;  Location: [facility];  Service: Open Heart Surgery;  Laterality: N/A;    Current Outpatient Medications  Medication Sig Dispense Refill  . ALPRAZolam (XANAX) 0.5 MG tablet Take 0.5 mg by mouth at bedtime as needed for sleep.    Marland Kitchen aspirin 325 MG EC tablet Take 1 tablet (325 mg total) by mouth daily. 30 tablet 0  . atorvastatin (LIPITOR) 80 MG tablet TAKE 1 TABLET BY MOUTH EVERY DAY AT 6 PM 30 tablet 6  . DEXILANT 60 MG capsule TAKE ONE CAPSULE BY MOUTH EVERY DAY. 30 capsule 5  . hydrALAZINE (APRESOLINE) 25 MG tablet Take 1 tablet (25 mg total) by mouth every 8 (eight) hours. 90 tablet 1  . metoprolol tartrate (LOPRESSOR) 50 MG tablet TAKE 1 TABLET BY MOUTH TWICE DAILY 180 tablet 2  . sitaGLIPtan-metformin (JANUMET) 50-1000 MG per  tablet Take 1 tablet by mouth 2 (two) times daily with a meal.       No current facility-administered medications for this visit.    Allergies:  Patient has no known allergies.   Social History: The patient  reports that he has never smoked. He has never used smokeless tobacco. He reports that he does not drink alcohol or use drugs.   ROS:  Please see the history of present illness. Otherwise, complete review of systems is positive for none.  All other systems are reviewed and negative.   Physical Exam: VS:  BP (!)  148/78 (BP Location: Right Arm)   Pulse 66   Ht 5\' 11"  (1.803 m)   Wt 236 lb (107 kg)   SpO2 94%   BMI 32.92 kg/m , BMI Body mass index is 32.92 kg/m.  Wt Readings from Last 3 Encounters:  11/10/18 236 lb (107 kg)  11/01/18 235 lb 7.2 oz (106.8 kg)  09/09/18 226 lb (102.5 kg)    General: Elderly male, appears comfortable at rest. HEENT: Conjunctiva and lids normal, oropharynx clear. Neck: Supple, no elevated JVP or carotid bruits, no thyromegaly. Lungs: Clear to auscultation, nonlabored breathing at rest. Thorax: Well-healed sternal incision. Cardiac: Regular rate and rhythm, no S3 or significant systolic murmur. Abdomen: Soft, nontender, bowel sounds present. Extremities: No pitting edema, distal pulses 2+. Skin: Warm and dry.  Possible fungal skin infection right lower leg. Musculoskeletal: No kyphosis. Neuropsychiatric: Alert and oriented x3, affect grossly appropriate.  ECG: I personally reviewed the tracing from 06/11/2018 which showed sinus bradycardia with nonspecific ST-T changes.  Recent Labwork: 05/02/2018: B Natriuretic Peptide 632.0 05/10/2018: ALT 22; AST 19 05/12/2018: Magnesium 2.2 06/11/2018: BUN 24; Creatinine, Ser 1.49; Hemoglobin 12.9; Platelets 254; Potassium 4.7; Sodium 133; TSH 4.103   Other Studies Reviewed Today:  Intraoperative TEE 05/11/2018:  Aortic valve: The valve is trileaflet. Mild valve thickening present. Mild valve calcification present. Mildly decreased leaflet separation. No stenosis. Mild to moderate regurgitation. No AV vegetation.  Mitral valve: No leaflet thickening and calcification present. Mild mitral annular calcification. Trace regurgitation.  Right ventricle: Normal cavity size, wall thickness and ejection fraction. No thrombus present. No mass present.  Echocardiogram 05/03/2018: Study Conclusions  - Left ventricle: The cavity size was moderately dilated. Wall thickness was increased in a pattern of mild LVH.  Systolic function was normal. The estimated ejection fraction was in the range of 50% to 55%. There is mild hypokinesis of the basal-midinferior myocardium. Features are consistent with a pseudonormal left ventricular filling pattern, with concomitant abnormal relaxation and increased filling pressure (grade 2 diastolic dysfunction). - Aortic valve: Mildly to moderately calcified annulus. Trileaflet; moderately calcified leaflets. Noncoronary cusp mobility was restricted. There was mild stenosis. There was trivial regurgitation. Mean gradient (S): 22 mm Hg. Peak gradient (S): 39 mm Hg. VTI ratio of LVOT to aortic valve: 0.42. Valve area (VTI): 2.08 cm^2. Valve area (Vmax): 1.9 cm^2. Valve area (Vmean): 1.86 cm^2. - Mitral valve: Moderately calcified annulus. There was mild regurgitation. - Left atrium: The atrium was moderately dilated. - Right atrium: Central venous pressure (est): 8 mm Hg. - Atrial septum: No defect or patent foramen ovale was identified. - Tricuspid valve: There was trivial regurgitation. - Pulmonary arteries: Systolic pressure was moderately increased. PA peak pressure: 50 mm Hg (S). - Coronary sinus: The vessel was dilated. - Pericardium, extracardiac: There was no pericardial effusion.  Assessment and Plan:  1.  Multivessel CAD status post CABG in June.  He completes  cardiac rehabilitation later this week.  Overall doing well, continue with medical therapy and observation.  2.  Hyperlipidemia, on Lipitor.  Requesting the last lab work from Dr. Nevada Crane.  May may need repeat levels.  3.  Essential hypertension, continue hydralazine and Lopressor.  4.  Possible tinea corporis right lower leg, to try over-the-counter antifungal cream.  5.  CKD stage III, last creatinine 1.49.  Current medicines were reviewed with the patient today.  Disposition: Follow-up in 6 months.  Signed, Satira Sark, MD, [facility] 11/10/2018 1:36 PM     [facility] at [name] [address], [place], [place] [address] Phone: [phone]; Fax: [phone]

## 2018-11-12 ENCOUNTER — Encounter (HOSPITAL_COMMUNITY)
Admission: RE | Admit: 2018-11-12 | Discharge: 2018-11-12 | Disposition: A | Discharge status: Home / Self Care | Payer: Medicare Other | Source: Ambulatory Visit | Attending: Cardiology | Admitting: Cardiology

## 2018-11-12 DIAGNOSIS — Z951 Presence of aortocoronary bypass graft: Secondary | ICD-10-CM

## 2018-11-12 DIAGNOSIS — I214 Non-ST elevation (NSTEMI) myocardial infarction: Secondary | ICD-10-CM

## 2018-11-12 NOTE — Progress Notes (Signed)
Discharge Progress Report  Patient Details  Name: Alejandro [name] MRN: [id number] Date of Birth: 1941-09-17 Referring Provider:     [facility] from 08/13/2018 in [place]  Referring Provider  [name]       Number of Visits: 64  Reason for Discharge:  Patient reached a stable level of exercise. Patient independent in their exercise. Patient has met program and personal goals.  Smoking History:  Social History   Tobacco Use  Smoking Status Never Smoker  Smokeless Tobacco Never Used    Diagnosis:  NSTEMI (non-ST elevated myocardial infarction) ([place])  S/P CABG x 4  ADL UCSD:   Initial Exercise Prescription: Initial Exercise Prescription - 08/13/18 1400      Date of Initial Exercise RX and Referring Provider   Date  08/13/18    Referring Provider  [name]      NuStep   Level  1    SPM  54    Minutes  17    METs  1.8      Arm Ergometer   Level  1    Watts  12    RPM  55    Minutes  17    METs  1.9      Prescription Details   Frequency (times per week)  3    Duration  Progress to 30 minutes of continuous aerobic without signs/symptoms of physical distress      Intensity   THRR 40-80% of Max Heartrate  97-113-129    Ratings of Perceived Exertion  11-13    Perceived Dyspnea  0-4      Progression   Progression  Continue to progress workloads to maintain intensity without signs/symptoms of physical distress.      Resistance Training   Training Prescription  Yes    Weight  1    Reps  10-15       Discharge Exercise Prescription (Final Exercise Prescription Changes): Exercise Prescription Changes - 11/02/18 0800      Response to Exercise   Blood Pressure (Admit)  126/72    Blood Pressure (Exercise)  136/74    Blood Pressure (Exit)  122/76    Heart Rate (Admit)  63 bpm    Heart Rate (Exercise)  89 bpm    Heart Rate (Exit)  67 bpm    Rating of Perceived Exertion (Exercise)  11    Comments  Performed  discharge 6MWT today     Duration  Progress to 30 minutes of  aerobic without signs/symptoms of physical distress    Intensity  THRR unchanged      Progression   Progression  Continue to progress workloads to maintain intensity without signs/symptoms of physical distress.    Average METs  2.61      Resistance Training   Training Prescription  Yes    Weight  2   can't increase due to shoulder pain    Reps  10-15      NuStep   Level  3    SPM  103    Minutes  17    METs  3.2      Arm Ergometer   Level  3.5    Watts  21    RPM  42    Minutes  22    METs  2.4      Home Exercise Plan   Plans to continue exercise at  Home (comment)    Frequency  Add 2 additional days  to program exercise sessions.    Initial Home Exercises Provided  08/13/18       Functional Capacity: 6 Minute Walk    Row Name 08/13/18 1351 11/01/18 0857       6 Minute Walk   Phase  Initial  Discharge    Distance  1200 feet  1250 feet    Distance % Change  -  4 %    Distance Feet Change  -  50 ft    Walk Time  6 minutes  6 minutes    # of Rest Breaks  0  0    MPH  2.27  2.37    METS  2.74  2.81    RPE  10  11    Perceived Dyspnea   9  8    VO2 Peak  8.68  7.61    Symptoms  Yes (comment)  No    Comments  pain in both calves 6/10, 2/10 after 2 minute rest   -    Resting HR  66 bpm  68 bpm    Resting BP  156/80  126/78    Resting Oxygen Saturation   98 %  96 %    Exercise Oxygen Saturation  during 6 min walk  96 %  96 %    Max Ex. HR  86 bpm  88 bpm    Max Ex. BP  194/82  136/74    2 Minute Post BP  168/78  124/72       Psychological, QOL, Others - Outcomes: PHQ 2/9: Depression screen [facility] 2/9 11/12/2018 09/09/2018 08/13/2018 06/22/2018 05/27/2018  Decreased Interest 0 0 0 0 0  Down, Depressed, Hopeless 0 0 0 0 1  PHQ - 2 Score 0 0 0 0 1  Altered sleeping 1 - 2 - -  Tired, decreased energy 0 - 0 - -  Change in appetite 0 - 0 - -  Feeling bad or failure about yourself  0 - 0 - -  Trouble  concentrating 0 - 0 - -  Moving slowly or fidgety/restless 0 - 0 - -  Suicidal thoughts 0 - 0 - -  PHQ-9 Score 1 - 2 - -  Difficult doing work/chores Not difficult at all - Somewhat difficult - -    Quality of Life: Quality of Life - 11/12/18 1334      Quality of Life   Select  Quality of Life      Quality of Life Scores   Health/Function Pre  28.68 %    Health/Function Post  27 %    Health/Function % Change  -5.86 %    Socioeconomic Pre  28.29 %    Psych/Spiritual Pre  30 %    Psych/Spiritual Post  26.57 %    Psych/Spiritual % Change  -11.43 %    Family Pre  19.5 %    Family Post  24 %    Family % Change  23.08 %    GLOBAL Pre  28.44 %    GLOBAL Post  27 %    GLOBAL % Change  -5.06 %       Personal Goals: Goals established at orientation with interventions provided to work toward goal. Personal Goals and Risk Factors at Admission - 08/13/18 1516      Core Components/Risk Factors/Patient Goals on Admission    Weight Management  Weight Maintenance    Personal Goal Other  Yes    Personal Goal  Get stronger  Intervention  Attend CR 3xweek and supplement 2 x week with home exercise    Expected Outcomes  Reach personal goals.        Personal Goals Discharge: Goals and Risk Factor Review    Row Name 09/06/18 1427 10/04/18 1428 10/25/18 1437 11/12/18 1415       Core Components/Risk Factors/Patient Goals Review   Personal Goals Review  Weight Management/Obesity;Diabetes Gain strength; continue to gain strength.   Weight Management/Obesity;Diabetes Gain strength; continue to gain strength.   Weight Management/Obesity;Diabetes Gain strength; continue to gain strength.   Weight Management/Obesity;Diabetes Gain strength; continue to gain strength.     Review  Patient has completed 10 sessions losing 2 lbs since his initial visit. He is doing well in the program with progression. He says he feels better overall and does feel stronger. His doctor will not release him to return  to work until he complete the program. His reported glucose readings have improved since he started the program. His last Hgb A1C was on 05/30/18 at 6.0. He says this will be repeated this month. He is very anxious to go back to work. He says he gets bored around the house but is now feeling like doing more around the house. He cooked dinner recently for the first time since his event. Will continue to monitor for progress.   Patient has completed 20 sessions maintaining his weight since last 30 day review. He continues to do well in the program with progression. He says the program is helping him be more active and he feels stronger and better overall. 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 His fasting glucose readings have improved. No recent A1C's on file. He says he is continues to try and limit his carbs. Will continue to monitor for progress.   Patient has completed 29 sessions gaining 1 lb since last 30 day review. He continues to do well in the program with progression. He continues to say the program has helped him more than he ever thought it would. He feels stronger and has more stamina to do more activities. He is awaiting clearance from his cardiologist to go back to work full time. He says he feels strong enough and ready. His right shoulder continues to bother him some. His PCP discontinued his diabetic medication. His fasting glucose today was 123 mg/dl. Will continue to monitor for progress.   Patient graduated with 36 sessions gaining 8 lbs overall. He did well in the program with exit measurements improving in balance and flexibility. His exit walk test increased by 4%. He says he feels a lot better overall and feels stronger and has more energy. He is not ready to go back to work in his lawn care business. He says he is able to do all the activities he wants to do. He plans to keep active doing his lawn care. CR will f/u for one year.     Expected Outcomes   Patient will continue to attend sessions and complete the program meeting his personal goals.   Patient will continue to attend sessions and complete the program meeting his personal goals.   Patient will continue to attend sessions and complete the program meeting his personal goals.   Patient will continue to stay active doing his lawn care business and continue to meet his personal goals.        Exercise Goals and Review: Exercise Goals    Row Name 08/13/18 1357  Exercise Goals   Increase Physical Activity  Yes       Intervention  Provide advice, education, support and counseling about physical activity/exercise needs.;Develop an individualized exercise prescription for aerobic and resistive training based on initial evaluation findings, risk stratification, comorbidities and participant's personal goals.       Expected Outcomes  Short Term: Attend rehab on a regular basis to increase amount of physical activity.       Increase Strength and Stamina  Yes       Intervention  Provide advice, education, support and counseling about physical activity/exercise needs.;Develop an individualized exercise prescription for aerobic and resistive training based on initial evaluation findings, risk stratification, comorbidities and participant's personal goals.       Expected Outcomes  Short Term: Increase workloads from initial exercise prescription for resistance, speed, and METs.;Short Term: Perform resistance training exercises routinely during rehab and add in resistance training at home;Long Term: Improve cardiorespiratory fitness, muscular endurance and strength as measured by increased METs and functional capacity (6MWT)       Able to understand and use rate of perceived exertion (RPE) scale  Yes       Intervention  Provide education and explanation on how to use RPE scale       Expected Outcomes  Short Term: Able to use RPE daily in rehab to express subjective intensity level;Long Term:   Able to use RPE to guide intensity level when exercising independently       Knowledge and understanding of Target Heart Rate Range (THRR)  Yes       Intervention  Provide education and explanation of THRR including how the numbers were predicted and where they are located for reference       Expected Outcomes  Short Term: Able to state/look up THRR;Long Term: Able to use THRR to govern intensity when exercising independently;Short Term: Able to use daily as guideline for intensity in rehab       Able to check pulse independently  Yes       Intervention  Provide education and demonstration on how to check pulse in carotid and radial arteries.;Review the importance of being able to check your own pulse for safety during independent exercise       Expected Outcomes  Short Term: Able to explain why pulse checking is important during independent exercise;Long Term: Able to check pulse independently and accurately       Understanding of Exercise Prescription  Yes       Intervention  Provide education, explanation, and written materials on patient's individual exercise prescription       Expected Outcomes  Short Term: Able to explain program exercise prescription;Long Term: Able to explain home exercise prescription to exercise independently          Exercise Goals Re-Evaluation: Exercise Goals Re-Evaluation    Row Name 08/16/18 1623 09/06/18 1409 10/01/18 1433 10/25/18 1237       Exercise Goal Re-Evaluation   Exercise Goals Review  Increase Physical Activity;Increase Strength and Stamina;Able to understand and use rate of perceived exertion (RPE) scale;Knowledge and understanding of Target Heart Rate Range (THRR);Able to check pulse independently;Understanding of Exercise Prescription  Increase Physical Activity;Increase Strength and Stamina;Able to understand and use rate of perceived exertion (RPE) scale;Knowledge and understanding of Target Heart Rate Range (THRR);Able to check pulse  independently;Understanding of Exercise Prescription  Increase Physical Activity;Increase Strength and Stamina;Able to understand and use rate of perceived exertion (RPE) scale;Knowledge and understanding of Target Heart Rate  Range (THRR);Able to check pulse independently;Understanding of Exercise Prescription  Increase Physical Activity;Increase Strength and Stamina;Able to understand and use rate of perceived exertion (RPE) scale;Knowledge and understanding of Target Heart Rate Range (THRR);Able to check pulse independently;Understanding of Exercise Prescription    Comments  Patient just started program. has completed 2 visits. Will continue to monitor progress.   Patient has done well in the program. He has stated that he feels stronger and is getting better. He has progressed to level 2 on both pieces of equipment (NuStep and Arm Ergometer). His R shoulder bothers him so he has not progressed on weights. We will continue to monitor his progress.   Patient continues to gain strength overall. He has been able to return to work some but would still like to return full time after the program. Pateint still has pain in his R shoulder. He has increased his overall MET level. We will continue to progress him as tolerable.   Patient is happy he has been able to at least go to work, even if he isn't able to lift or do manual work yet. He feels stronger but still has trouble with his R shoulder, and his incision site is now sore/..     Expected Outcomes  Gain strength  Gain strength to get back to work.   Increase strength and stamina to get back to work full time.   Increase strength and stamina to get back to work full time.        Nutrition & Weight - Outcomes: Pre Biometrics - 11/01/18 0900      Pre Biometrics   % Body Fat  27.1 %    Grip Strength  19.7 kg    Flexibility  14.8 in    Single Leg Stand  3.68 seconds      Post Biometrics - 11/01/18 0900       Post  Biometrics   Height  _0  (1.803 m)     Weight  106.8 kg    Waist Circumference  43 inches    Hip Circumference  39 inches    Waist to Hip Ratio  1.1 %    BMI (Calculated)  32.85    Triceps Skinfold  6 mm       Nutrition: Nutrition Therapy & Goals - 10/25/18 1437      Nutrition Therapy   RD appointment deferred  Yes      Personal Nutrition Goals   Comments  Patient continues to say he is trying to eat less carbs and sweets and heart healthy. Will continue to monitor for progress.       Intervention Plan   Intervention  Nutrition handout(s) given to patient.       Nutrition Discharge: Nutrition Assessments - 11/12/18 1413      MEDFICTS Scores   Pre Score  12    Post Score  12    Score Difference  0       Education Questionnaire Score: Knowledge Questionnaire Score - 11/12/18 1412      Knowledge Questionnaire Score   Pre Score  16/24    Post Score  19/24       Goals reviewed with patient; copy given to patient.

## 2018-11-12 NOTE — Progress Notes (Signed)
Daily Session Note  Patient Details  Name: Alejandro [name] MRN: [id number] Date of Birth: 1941/07/29 Referring Provider:     [place] from 08/13/2018 in [place]  Referring Provider  [name]      Encounter Date: 11/12/2018  Check In: Session Check In - 11/12/18 0815      Check-In   Supervising physician immediately available to respond to emergencies  See telemetry face sheet for immediately available MD    Location  [facility]    Staff Present  Benay Pike, Exercise Physiologist;Debra Wynetta Emery, RN, BSN;Diane Coad, MS, EP, [facility], Exercise Physiologist    Medication changes reported      No    Fall or balance concerns reported     No    Warm-up and Cool-down  Performed as group-led instruction    Resistance Training Performed  Yes    VAD Patient?  No    PAD/SET Patient?  No      Pain Assessment   Currently in Pain?  No/denies    Pain Score  0-No pain    Multiple Pain Sites  No       Capillary Blood Glucose: No results found for this or any previous visit (from the past 24 hour(s)).    Social History   Tobacco Use  Smoking Status Never Smoker  Smokeless Tobacco Never Used    Goals Met:  Independence with exercise equipment Exercise tolerated well No report of cardiac concerns or symptoms Strength training completed today  Goals Unmet:  Not Applicable  Comments: Pt able to follow exercise prescription today without complaint.  Will continue to monitor for progression. Check out 0915.   Dr. Kate Sable is Medical Director for [facility].

## 2018-11-12 NOTE — Progress Notes (Signed)
Cardiac Individual Treatment Plan  Patient Details  Name: Alejandro [name] MRN: [id number] Date of Birth: 05/07/1941 Referring Provider:     CARDIAC REHAB PHASE II ORIENTATION from 08/13/2018 in [place] [facility]  Referring Provider  [place]      Initial Encounter Date:    CARDIAC REHAB PHASE II ORIENTATION from 08/13/2018 in [address] CARDIAC REHABILITATION  Date  08/13/18      Visit Diagnosis: NSTEMI (non-ST elevated myocardial infarction) (HCC)  S/P CABG x 4  Patient's Home Medications on Admission:  Current Outpatient Medications:  .  ALPRAZolam (XANAX) 0.5 MG tablet, Take 0.5 mg by mouth at bedtime as needed for sleep., Disp: , Rfl:  .  aspirin 325 MG EC tablet, Take 1 tablet (325 mg total) by mouth daily., Disp: 30 tablet, Rfl: 0 .  atorvastatin (LIPITOR) 80 MG tablet, TAKE 1 TABLET BY MOUTH EVERY DAY AT 6 PM, Disp: 30 tablet, Rfl: 6 .  DEXILANT 60 MG capsule, TAKE ONE CAPSULE BY MOUTH EVERY DAY., Disp: 30 capsule, Rfl: 5 .  hydrALAZINE (APRESOLINE) 25 MG tablet, Take 1 tablet (25 mg total) by mouth every 8 (eight) hours., Disp: 90 tablet, Rfl: 1 .  metoprolol tartrate (LOPRESSOR) 50 MG tablet, TAKE 1 TABLET BY MOUTH TWICE DAILY, Disp: 180 tablet, Rfl: 2 .  sitaGLIPtan-metformin (JANUMET) 50-1000 MG per tablet, Take 1 tablet by mouth 2 (two) times daily with a meal.  , Disp: , Rfl:   Past Medical History: Past Medical History:  Diagnosis Date  . Aortic insufficiency    a. mild-mod by TEE 05/2018 (no aortic stenosis).  . CAD (coronary artery disease)    a. NSTEMI 05/2018 with multivessel disease -> s/p CABGx4 05/11/18.  . Chronic diastolic CHF (congestive heart failure) (HCC)    a. dx 05/2018.  . CKD (chronic kidney disease), stage III (HCC)   . History of nephrolithiasis 2004  . Hyperlipidemia   . Hypertension   . IBS (irritable colon syndrome)   . Postoperative atrial fibrillation (HCC)   . PVC's (premature ventricular contractions)   . Reflux   . Type  2 diabetes mellitus (HCC)     Tobacco Use: Social History   Tobacco Use  Smoking Status Never Smoker  Smokeless Tobacco Never Used    Labs: Recent Review Flowsheet Data    Labs for ITP Cardiac and Pulmonary Rehab Latest Ref Rng & Units 05/11/2018 05/11/2018 05/12/2018 05/12/2018 05/12/2018   Hemoglobin A1c 4.8 - 5.6 % - - - - -   PHART 7.350 - 7.450 7.326(L) 7.368 7.376 - -   PCO2ART 32.0 - 48.0 mmHg 40.8 38.1 39.5 - -   HCO3 20.0 - 28.0 mmol/L 21.6 21.8 23.1 - -   TCO2 22 - 32 mmol/L 23 23 24 22 21(L)   ACIDBASEDEF 0.0 - 2.0 mmol/L 5.0(H) 3.0(H) 2.0 - -   O2SAT % 97.0 98.0 98.0 - -      Capillary Blood Glucose: Lab Results  Component Value Date   GLUCAP 126 (H) 08/30/2018   GLUCAP 181 (H) 08/23/2018   GLUCAP 231 (H) 08/18/2018   GLUCAP 213 (H) 08/16/2018   GLUCAP 127 (H) 05/17/2018     Exercise Target Goals: Exercise Program Goal: Individual exercise prescription set using results from initial 6 min walk test and THRR while considering  patient's activity barriers and safety.   Exercise Prescription Goal: Starting with aerobic activity 30 plus minutes a day, 3 days per week for initial exercise prescription. Provide home exercise prescription and   guidelines that participant acknowledges understanding prior to discharge.  Activity Barriers & Risk Stratification: Activity Barriers & Cardiac Risk Stratification - 08/13/18 1352      Activity Barriers & Cardiac Risk Stratification   Activity Barriers  Muscular Weakness;Other (comment)    Comments  calf pain     Cardiac Risk Stratification  High       6 Minute Walk: 6 Minute Walk    Row Name 08/13/18 1351 11/01/18 0857       6 Minute Walk   Phase  Initial  Discharge    Distance  1200 feet  1250 feet    Distance % Change  -  4 %    Distance Feet Change  -  50 ft    Walk Time  6 minutes  6 minutes    # of Rest Breaks  0  0    MPH  2.27  2.37    METS  2.74  2.81    RPE  10  11    Perceived Dyspnea   9  8    VO2  Peak  8.68  7.61    Symptoms  Yes (comment)  No    Comments  pain in both calves 6/10, 2/10 after 2 minute rest   -    Resting HR  66 bpm  68 bpm    Resting BP  156/80  126/78    Resting Oxygen Saturation   98 %  96 %    Exercise Oxygen Saturation  during 6 min walk  96 %  96 %    Max Ex. HR  86 bpm  88 bpm    Max Ex. BP  194/82  136/74    2 Minute Post BP  168/78  124/72       Oxygen Initial Assessment:   Oxygen Re-Evaluation:   Oxygen Discharge (Final Oxygen Re-Evaluation):   Initial Exercise Prescription: Initial Exercise Prescription - 08/13/18 1400      Date of Initial Exercise RX and Referring Provider   Date  08/13/18    Referring Provider  [name]      NuStep   Level  1    SPM  54    Minutes  17    METs  1.8      Arm Ergometer   Level  1    Watts  12    RPM  55    Minutes  17    METs  1.9      Prescription Details   Frequency (times per week)  3    Duration  Progress to 30 minutes of continuous aerobic without signs/symptoms of physical distress      Intensity   THRR 40-80% of Max Heartrate  97-113-129    Ratings of Perceived Exertion  11-13    Perceived Dyspnea  0-4      Progression   Progression  Continue to progress workloads to maintain intensity without signs/symptoms of physical distress.      Resistance Training   Training Prescription  Yes    Weight  1    Reps  10-15       Perform Capillary Blood Glucose checks as needed.  Exercise Prescription Changes:  Exercise Prescription Changes    Row Name 08/30/18 1400 09/20/18 1500 10/08/18 1500 10/20/18 1400 11/02/18 0800     Response to Exercise   Blood Pressure (Admit)  134/70  140/70  118/74  120/60  126/72   Blood Pressure (Exercise)  158/66  142/70  146/76  158/80  136/74   Blood Pressure (Exit)  138/72  130/70  124/66  118/68  122/76   Heart Rate (Admit)  74 bpm  61 bpm  63 bpm  68 bpm  63 bpm   Heart Rate (Exercise)  96 bpm  80 bpm  74 bpm  94 bpm  89 bpm   Heart Rate (Exit)   81 bpm  70 bpm  68 bpm  77 bpm  67 bpm   Rating of Perceived Exertion (Exercise)  _0 Comments  First full week of exercise   increased overall MET level  -  increase in overall MET level   Performed discharge 6MWT today    Duration  Progress to 30 minutes of  aerobic without signs/symptoms of physical distress  Progress to 30 minutes of  aerobic without signs/symptoms of physical distress  Progress to 30 minutes of  aerobic without signs/symptoms of physical distress  Progress to 30 minutes of  aerobic without signs/symptoms of physical distress  Progress to 30 minutes of  aerobic without signs/symptoms of physical distress   Intensity  THRR unchanged  THRR unchanged  THRR unchanged  THRR unchanged  THRR unchanged     Progression   Progression  Continue to progress workloads to maintain intensity without signs/symptoms of physical distress.  Continue to progress workloads to maintain intensity without signs/symptoms of physical distress.  Continue to progress workloads to maintain intensity without signs/symptoms of physical distress.  Continue to progress workloads to maintain intensity without signs/symptoms of physical distress.  Continue to progress workloads to maintain intensity without signs/symptoms of physical distress.   Average METs  2.2  2.75  2.75  3.2  2.61     Resistance Training   Training Prescription  Yes  Yes  Yes  Yes  Yes   Weight  _1 can't increase due to shoulder pain   2 can't increase due to shoulder pain    Reps  10-15  10-15  10-15  10-15  10-15     NuStep   Level  _2 SPM  86  99  97  94  103   Minutes  _3 METs  2.1  2.5  2.9  2.2  3.2     Arm Ergometer   Level  _4 3.5   Watts  _5 RPM  52  69  45  58  42   Minutes  _6 METs  2.3  3  2.6  3.2  2.4     Home Exercise Plan   Plans to continue exercise at  Home (comment) walking  Home (comment)  Home (comment)  Home  (comment)  Home (comment)   Frequency  Add 2 additional days to program exercise sessions.  Add 2 additional days to program exercise sessions.  Add 2 additional days to program exercise sessions.  Add 2 additional days to program exercise sessions.  Add 2 additional days to program exercise sessions.   Initial Home Exercises Provided  08/13/18  08/13/18  08/13/18  08/13/18  08/13/18      Exercise Comments:  Exercise Comments    Row  Name 08/16/18 1624 09/06/18 1411 10/01/18 1441 10/25/18 1238     Exercise Comments  Patient wants to get stronger.   Patient wants to get stronger and has recently stated he would like to sleep better. He works hard everyday in rehab and reports that it is helping him to be able to do more around the house. He cooked himself dinner for the first time since his surgery!   Patient has reported that he is feeling stronger overall. He is still having trouble with his R should which holds him back some, but not much. He has increased to level 3 on the Arm Ergometer. He is happy to be returning to work a few hours a week as tolerated. Patient reports he is still not sleeping well most nights.   Patient feels as if his stamina has gotten better and he doesn't get as tired when out doing things. His shoulder still bothers him and his incision site has began to be sore but he still pushes to work hard and tolerate any increases with ease.        Exercise Goals and Review:  Exercise Goals    Row Name 08/13/18 1357             Exercise Goals   Increase Physical Activity  Yes       Intervention  Provide advice, education, support and counseling about physical activity/exercise needs.;Develop an individualized exercise prescription for aerobic and resistive training based on initial evaluation findings, risk stratification, comorbidities and participant's personal goals.       Expected Outcomes  Short Term: Attend rehab on a regular basis to increase amount of physical  activity.       Increase Strength and Stamina  Yes       Intervention  Provide advice, education, support and counseling about physical activity/exercise needs.;Develop an individualized exercise prescription for aerobic and resistive training based on initial evaluation findings, risk stratification, comorbidities and participant's personal goals.       Expected Outcomes  Short Term: Increase workloads from initial exercise prescription for resistance, speed, and METs.;Short Term: Perform resistance training exercises routinely during rehab and add in resistance training at home;Long Term: Improve cardiorespiratory fitness, muscular endurance and strength as measured by increased METs and functional capacity (6MWT)       Able to understand and use rate of perceived exertion (RPE) scale  Yes       Intervention  Provide education and explanation on how to use RPE scale       Expected Outcomes  Short Term: Able to use RPE daily in rehab to express subjective intensity level;Long Term:  Able to use RPE to guide intensity level when exercising independently       Knowledge and understanding of Target Heart Rate Range (THRR)  Yes       Intervention  Provide education and explanation of THRR including how the numbers were predicted and where they are located for reference       Expected Outcomes  Short Term: Able to state/look up THRR;Long Term: Able to use THRR to govern intensity when exercising independently;Short Term: Able to use daily as guideline for intensity in rehab       Able to check pulse independently  Yes       Intervention  Provide education and demonstration on how to check pulse in carotid and radial arteries.;Review the importance of being able to check your own pulse for safety during independent exercise  Expected Outcomes  Short Term: Able to explain why pulse checking is important during independent exercise;Long Term: Able to check pulse independently and accurately        Understanding of Exercise Prescription  Yes       Intervention  Provide education, explanation, and written materials on patient's individual exercise prescription       Expected Outcomes  Short Term: Able to explain program exercise prescription;Long Term: Able to explain home exercise prescription to exercise independently          Exercise Goals Re-Evaluation : Exercise Goals Re-Evaluation    Row Name 08/16/18 1623 09/06/18 1409 10/01/18 1433 10/25/18 1237       Exercise Goal Re-Evaluation   Exercise Goals Review  Increase Physical Activity;Increase Strength and Stamina;Able to understand and use rate of perceived exertion (RPE) scale;Knowledge and understanding of Target Heart Rate Range (THRR);Able to check pulse independently;Understanding of Exercise Prescription  Increase Physical Activity;Increase Strength and Stamina;Able to understand and use rate of perceived exertion (RPE) scale;Knowledge and understanding of Target Heart Rate Range (THRR);Able to check pulse independently;Understanding of Exercise Prescription  Increase Physical Activity;Increase Strength and Stamina;Able to understand and use rate of perceived exertion (RPE) scale;Knowledge and understanding of Target Heart Rate Range (THRR);Able to check pulse independently;Understanding of Exercise Prescription  Increase Physical Activity;Increase Strength and Stamina;Able to understand and use rate of perceived exertion (RPE) scale;Knowledge and understanding of Target Heart Rate Range (THRR);Able to check pulse independently;Understanding of Exercise Prescription    Comments  Patient just started program. has completed 2 visits. Will continue to monitor progress.   Patient has done well in the program. He has stated that he feels stronger and is getting better. He has progressed to level 2 on both pieces of equipment (NuStep and Arm Ergometer). His R shoulder bothers him so he has not progressed on weights. We will continue to monitor  his progress.   Patient continues to gain strength overall. He has been able to return to work some but would still like to return full time after the program. Pateint still has pain in his R shoulder. He has increased his overall MET level. We will continue to progress him as tolerable.   Patient is happy he has been able to at least go to work, even if he isn't able to lift or do manual work yet. He feels stronger but still has trouble with his R shoulder, and his incision site is now sore/..     Expected Outcomes  Gain strength  Gain strength to get back to work.   Increase strength and stamina to get back to work full time.   Increase strength and stamina to get back to work full time.         Discharge Exercise Prescription (Final Exercise Prescription Changes): Exercise Prescription Changes - 11/02/18 0800      Response to Exercise   Blood Pressure (Admit)  126/72    Blood Pressure (Exercise)  136/74    Blood Pressure (Exit)  122/76    Heart Rate (Admit)  63 bpm    Heart Rate (Exercise)  89 bpm    Heart Rate (Exit)  67 bpm    Rating of Perceived Exertion (Exercise)  11    Comments  Performed discharge 6MWT today     Duration  Progress to 30 minutes of  aerobic without signs/symptoms of physical distress    Intensity  THRR unchanged      Progression   Progression  Continue  to progress workloads to maintain intensity without signs/symptoms of physical distress.    Average METs  2.61      Resistance Training   Training Prescription  Yes    Weight  2   can't increase due to shoulder pain    Reps  10-15      NuStep   Level  3    SPM  103    Minutes  17    METs  3.2      Arm Ergometer   Level  3.5    Watts  21    RPM  42    Minutes  22    METs  2.4      Home Exercise Plan   Plans to continue exercise at  Home (comment)    Frequency  Add 2 additional days to program exercise sessions.    Initial Home Exercises Provided  08/13/18       Nutrition:  Target Goals:  Understanding of nutrition guidelines, daily intake of sodium <1523m, cholesterol <2014m calories 30% from fat and 7% or less from saturated fats, daily to have 5 or more servings of fruits and vegetables.  Biometrics: Pre Biometrics - 11/01/18 0900      Pre Biometrics   % Body Fat  27.1 %    Grip Strength  19.7 kg    Flexibility  14.8 in    Single Leg Stand  3.68 seconds      Post Biometrics - 11/01/18 0900       Post  Biometrics   Height  _0  (1.803 m)    Weight  106.8 kg    Waist Circumference  43 inches    Hip Circumference  39 inches    Waist to Hip Ratio  1.1 %    BMI (Calculated)  32.85    Triceps Skinfold  6 mm       Nutrition Therapy Plan and Nutrition Goals: Nutrition Therapy & Goals - 10/25/18 1437      Nutrition Therapy   RD appointment deferred  Yes      Personal Nutrition Goals   Comments  Patient continues to say he is trying to eat less carbs and sweets and heart healthy. Will continue to monitor for progress.       Intervention Plan   Intervention  Nutrition handout(s) given to patient.       Nutrition Assessments: Nutrition Assessments - 11/12/18 1413      MEDFICTS Scores   Pre Score  12    Post Score  12    Score Difference  0       Nutrition Goals Re-Evaluation:   Nutrition Goals Discharge (Final Nutrition Goals Re-Evaluation):   Psychosocial: Target Goals: Acknowledge presence or absence of significant depression and/or stress, maximize coping skills, provide positive support system. Participant is able to verbalize types and ability to use techniques and skills needed for reducing stress and depression.  Initial Review & Psychosocial Screening: Initial Psych Review & Screening - 08/13/18 1512      Initial Review   Current issues with  None Identified      Family Dynamics   Good Support System?  Yes      Barriers   Psychosocial barriers to participate in program  There are no identifiable barriers or psychosocial needs.       Screening Interventions   Interventions  Encouraged to exercise    Expected Outcomes  Short Term goal: Identification and review with participant of any  Quality of Life or Depression concerns found by scoring the questionnaire.;Long Term goal: The participant improves quality of Life and PHQ9 Scores as seen by post scores and/or verbalization of changes       Quality of Life Scores: Quality of Life - 11/12/18 1334      Quality of Life   Select  Quality of Life      Quality of Life Scores   Health/Function Pre  28.68 %    Health/Function Post  27 %    Health/Function % Change  -5.86 %    Socioeconomic Pre  28.29 %    Psych/Spiritual Pre  30 %    Psych/Spiritual Post  26.57 %    Psych/Spiritual % Change  -11.43 %    Family Pre  19.5 %    Family Post  24 %    Family % Change  23.08 %    GLOBAL Pre  28.44 %    GLOBAL Post  27 %    GLOBAL % Change  -5.06 %      Scores of 19 and below usually indicate a poorer quality of life in these areas.  A difference of  2-3 points is a clinically meaningful difference.  A difference of 2-3 points in the total score of the Quality of Life Index has been associated with significant improvement in overall quality of life, self-image, physical symptoms, and general health in studies assessing change in quality of life.  PHQ-9: Recent Review Flowsheet Data    Depression screen [facility] 2/9 11/12/2018 09/09/2018 08/13/2018 06/22/2018 05/27/2018   Decreased Interest 0 0 0 0 0   Down, Depressed, Hopeless 0 0 0 0 1    PHQ - 2 Score 0 0 0 0 1   Altered sleeping 1 - 2 - -   Tired, decreased energy 0 - 0 - -   Change in appetite 0 - 0 - -   Feeling bad or failure about yourself  0 - 0 - -   Trouble concentrating 0 - 0 - -   Moving slowly or fidgety/restless 0 - 0 - -   Suicidal thoughts 0 - 0 - -   PHQ-9 Score 1 - 2 - -   Difficult doing work/chores Not difficult at all - Somewhat difficult - -     Interpretation of Total Score  Total Score Depression  Severity:  1-4 = Minimal depression, 5-9 = Mild depression, 10-14 = Moderate depression, 15-19 = Moderately severe depression, 20-27 = Severe depression   Psychosocial Evaluation and Intervention: Psychosocial Evaluation - 11/12/18 1414      Discharge Psychosocial Assessment & Intervention   Comments  Patient has no psychosocial issues identified at discharge. His QOL score decreased by 5.05% at 27.00 but his PHQ-9 score decreased from 2 to 1.       Psychosocial Re-Evaluation: Psychosocial Re-Evaluation    [place] Name 09/06/18 1431 10/04/18 1431 10/25/18 1442         Psychosocial Re-Evaluation   Current issues with  Current Sleep Concerns  Current Sleep Concerns  Current Sleep Concerns     Comments  Patient initial QOL score was 28.44 and his PHQ-9 score was 2 with no psychosocial issues identified. He says he has difficulty going to sleep at times and hopes this will resolve on his own.   Patient initial QOL score was 28.44 and his PHQ-9 score was 2 with no psychosocial issues identified. His sleep has improved some since last 30 day review. Will  continue to monitor.   Patient initial QOL score was 28.44 and his PHQ-9 score was 2 with no psychosocial issues identified. His sleep continues to improve slowly. Will continue to monitor.      Expected Outcomes  Patient will have no psychosocial issues identified at discharge.   Patient will have no psychosocial issues identified at discharge.   Patient will have no psychosocial issues identified at discharge.      Interventions  Stress management education;Encouraged to attend Cardiac Rehabilitation for the exercise;Relaxation education  Stress management education;Encouraged to attend Cardiac Rehabilitation for the exercise;Relaxation education  Stress management education;Encouraged to attend Cardiac Rehabilitation for the exercise;Relaxation education     Continue Psychosocial Services   No Follow up required  No Follow up required  No Follow up  required        Psychosocial Discharge (Final Psychosocial Re-Evaluation): Psychosocial Re-Evaluation - 10/25/18 1442      Psychosocial Re-Evaluation   Current issues with  Current Sleep Concerns    Comments  Patient initial QOL score was 28.44 and his PHQ-9 score was 2 with no psychosocial issues identified. His sleep continues to improve slowly. Will continue to monitor.     Expected Outcomes  Patient will have no psychosocial issues identified at discharge.     Interventions  Stress management education;Encouraged to attend Cardiac Rehabilitation for the exercise;Relaxation education    Continue Psychosocial Services   No Follow up required       Vocational Rehabilitation: Provide vocational rehab assistance to qualifying candidates.   Vocational Rehab Evaluation & Intervention: Vocational Rehab - 08/13/18 1516      Initial Vocational Rehab Evaluation & Intervention   Assessment shows need for Vocational Rehabilitation  No       Education: Education Goals: Education classes will be provided on a weekly basis, covering required topics. Participant will state understanding/return demonstration of topics presented.  Learning Barriers/Preferences: Learning Barriers/Preferences - 08/13/18 1515      Learning Barriers/Preferences   Learning Barriers  None    Learning Preferences  Audio;Pictoral;Video       Education Topics: Hypertension, Hypertension Reduction -Define heart disease and high blood pressure. Discus how high blood pressure affects the body and ways to reduce high blood pressure.   CARDIAC REHAB PHASE II EXERCISE from 11/10/2018 in [place]  Date  10/06/18  Educator  D. Coad  Instruction Review Code  2- Demonstrated Understanding      Exercise and Your Heart -Discuss why it is important to exercise, the FITT principles of exercise, normal and abnormal responses to exercise, and how to exercise safely.   CARDIAC REHAB PHASE II  EXERCISE from 11/10/2018 in [place]  Date  10/13/18  Educator  Coad  Instruction Review Code  2- Demonstrated Understanding      Angina -Discuss definition of angina, causes of angina, treatment of angina, and how to decrease risk of having angina.   CARDIAC REHAB PHASE II EXERCISE from 11/10/2018 in [place]  Date  10/20/18  Educator  Etheleen Mayhew   Instruction Review Code  2- Demonstrated Understanding      Cardiac Medications -Review what the following cardiac medications are used for, how they affect the body, and side effects that may occur when taking the medications.  Medications include Aspirin, Beta blockers, calcium channel blockers, ACE Inhibitors, angiotensin receptor blockers, diuretics, digoxin, and antihyperlipidemics.   CARDIAC REHAB PHASE II EXERCISE from 11/10/2018 in [place]  Date  10/27/18  Educator  D. Wynetta Emery   Instruction Review Code  2- Demonstrated Understanding      Congestive Heart Failure -Discuss the definition of CHF, how to live with CHF, the signs and symptoms of CHF, and how keep track of weight and sodium intake.   CARDIAC REHAB PHASE II EXERCISE from 11/10/2018 in [place]  Date  11/03/18  Educator  Wynetta Emery  Instruction Review Code  2- Demonstrated Understanding      Heart Disease and Intimacy -Discus the effect sexual activity has on the heart, how changes occur during intimacy as we age, and safety during sexual activity.   CARDIAC REHAB PHASE II EXERCISE from 11/10/2018 in [place]  Date  11/10/18  Educator  Etheleen Mayhew  Instruction Review Code  2- Demonstrated Understanding      Smoking Cessation / COPD -Discuss different methods to quit smoking, the health benefits of quitting smoking, and the definition of COPD.   CARDIAC REHAB PHASE II EXERCISE from 11/10/2018 in [place]  Date   08/18/18  Educator  Etheleen Mayhew  Instruction Review Code  2- Demonstrated Understanding      Nutrition I: Fats -Discuss the types of cholesterol, what cholesterol does to the heart, and how cholesterol levels can be controlled.   CARDIAC REHAB PHASE II EXERCISE from 11/10/2018 in [place]  Date  08/25/18  Educator  D. Coad  Instruction Review Code  2- Demonstrated Understanding      Nutrition II: Labels -Discuss the different components of food labels and how to read food label   [place] from 11/10/2018 in [place]  Date  09/01/18  Educator  Etheleen Mayhew  Instruction Review Code  2- Demonstrated Understanding      Heart Parts/Heart Disease and PAD -Discuss the anatomy of the heart, the pathway of blood circulation through the heart, and these are affected by heart disease.   CARDIAC REHAB PHASE II EXERCISE from 11/10/2018 in [place]  Date  09/08/18  Educator  Etheleen Mayhew  Instruction Review Code  2- Demonstrated Understanding      Stress I: Signs and Symptoms -Discuss the causes of stress, how stress may lead to anxiety and depression, and ways to limit stress.   CARDIAC REHAB PHASE II EXERCISE from 11/10/2018 in [place]  Date  09/15/18  Educator  D.Coad  Instruction Review Code  2- Demonstrated Understanding      Stress II: Relaxation -Discuss different types of relaxation techniques to limit stress.   CARDIAC REHAB PHASE II EXERCISE from 11/10/2018 in [place]  Date  09/22/18  Educator  D. Coad  Instruction Review Code  2- Demonstrated Understanding      Warning Signs of Stroke / TIA -Discuss definition of a stroke, what the signs and symptoms are of a stroke, and how to identify when someone is having stroke.   CARDIAC REHAB PHASE II EXERCISE from 11/10/2018 in [place]  Date  09/29/18    Educator  Etheleen Mayhew  Instruction Review Code  2- Demonstrated Understanding      Knowledge Questionnaire Score: Knowledge Questionnaire Score - 11/12/18 1412      Knowledge Questionnaire Score   Pre Score  16/24    Post Score  19/24       Core Components/Risk Factors/Patient Goals at Admission: Personal Goals and Risk Factors at Admission - 08/13/18 1516  Core Components/Risk Factors/Patient Goals on Admission    Weight Management  Weight Maintenance    Personal Goal Other  Yes    Personal Goal  Get stronger    Intervention  Attend CR 3xweek and supplement 2 x week with home exercise    Expected Outcomes  Reach personal goals.       Core Components/Risk Factors/Patient Goals Review:  Goals and Risk Factor Review    Row Name 09/06/18 1427 10/04/18 1428 10/25/18 1437 11/12/18 1415       Core Components/Risk Factors/Patient Goals Review   Personal Goals Review  Weight Management/Obesity;Diabetes Gain strength; continue to gain strength.   Weight Management/Obesity;Diabetes Gain strength; continue to gain strength.   Weight Management/Obesity;Diabetes Gain strength; continue to gain strength.   Weight Management/Obesity;Diabetes Gain strength; continue to gain strength.     Review  Patient has completed 10 sessions losing 2 lbs since his initial visit. He is doing well in the program with progression. He says he feels better overall and does feel stronger. His doctor will not release him to return to work until he complete the program. His reported glucose readings have improved since he started the program. His last Hgb A1C was on 05/30/18 at 6.0. He says this will be repeated this month. He is very anxious to go back to work. He says he gets bored around the house but is now feeling like doing more around the house. He cooked dinner recently for the first time since his event. Will continue to monitor for progress.   Patient has completed 20 sessions maintaining his weight since  last 30 day review. He continues to do well in the program with progression. He says the program is helping him be more active and he feels stronger and better overall. 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 His fasting glucose readings have improved. No recent A1C's on file. He says he is continues to try and limit his carbs. Will continue to monitor for progress.   Patient has completed 29 sessions gaining 1 lb since last 30 day review. He continues to do well in the program with progression. He continues to say the program has helped him more than he ever thought it would. He feels stronger and has more stamina to do more activities. He is awaiting clearance from his cardiologist to go back to work full time. He says he feels strong enough and ready. His right shoulder continues to bother him some. His PCP discontinued his diabetic medication. His fasting glucose today was 123 mg/dl. Will continue to monitor for progress.   Patient graduated with 36 sessions gaining 8 lbs overall. He did well in the program with exit measurements improving in balance and flexibility. His exit walk test increased by 4%. He says he feels a lot better overall and feels stronger and has more energy. He is not ready to go back to work in his lawn care business. He says he is able to do all the activities he wants to do. He plans to keep active doing his lawn care. CR will f/u for one year.     Expected Outcomes  Patient will continue to attend sessions and complete the program meeting his personal goals.   Patient will continue to attend sessions and complete the program meeting his personal goals.   Patient will continue to attend sessions and complete the program meeting his personal goals.   Patient will continue to stay  active doing his lawn care business and continue to meet his personal goals.        Core Components/Risk Factors/Patient Goals at Discharge (Final Review):  Goals and Risk  Factor Review - 11/12/18 1415      Core Components/Risk Factors/Patient Goals Review   Personal Goals Review  Weight Management/Obesity;Diabetes   Gain strength; continue to gain strength.    Review  Patient graduated with 36 sessions gaining 8 lbs overall. He did well in the program with exit measurements improving in balance and flexibility. His exit walk test increased by 4%. He says he feels a lot better overall and feels stronger and has more energy. He is not ready to go back to work in his lawn care business. He says he is able to do all the activities he wants to do. He plans to keep active doing his lawn care. CR will f/u for one year.     Expected Outcomes  Patient will continue to stay active doing his lawn care business and continue to meet his personal goals.        ITP Comments: ITP Comments    Row Name 08/19/18 1019           ITP Comments  Patient new to program completing 3 sessions. Will continue to monitor for progress.           Comments: Patient graduated from [facility] today on 11/12/18 after completing 36 sessions. He achieved LTG of 30 minutes of aerobic exercise at Max Met level of 2.7. All patients vitals are WNL. Patient has met with dietician. Discharge instruction has been reviewed in detail and patient stated an understanding of material given. Patient plans to stay active by doing his lawn care business. Cardiac Rehab staff will make f/u calls at 1 month, 6 months, and 1 year. Patient had no complaints of any abnormal S/S or pain on their exit visit.

## 2018-11-15 ENCOUNTER — Encounter (HOSPITAL_COMMUNITY): Admission: RE | Admit: 2018-11-15 | Payer: Medicare Other | Source: Ambulatory Visit

## 2018-11-30 DIAGNOSIS — H10812 Pingueculitis, left eye: Secondary | ICD-10-CM | POA: Diagnosis not present

## 2018-11-30 DIAGNOSIS — H01003 Unspecified blepharitis right eye, unspecified eyelid: Secondary | ICD-10-CM | POA: Diagnosis not present

## 2018-11-30 DIAGNOSIS — H11002 Unspecified pterygium of left eye: Secondary | ICD-10-CM | POA: Diagnosis not present

## 2018-12-24 DIAGNOSIS — H10812 Pingueculitis, left eye: Secondary | ICD-10-CM | POA: Diagnosis not present

## 2018-12-24 DIAGNOSIS — H01003 Unspecified blepharitis right eye, unspecified eyelid: Secondary | ICD-10-CM | POA: Diagnosis not present

## 2018-12-24 DIAGNOSIS — H11002 Unspecified pterygium of left eye: Secondary | ICD-10-CM | POA: Diagnosis not present

## 2018-12-24 DIAGNOSIS — D4989 Neoplasm of unspecified behavior of other specified sites: Secondary | ICD-10-CM | POA: Diagnosis not present

## 2019-02-11 DIAGNOSIS — D4989 Neoplasm of unspecified behavior of other specified sites: Secondary | ICD-10-CM | POA: Diagnosis not present

## 2019-02-11 DIAGNOSIS — H10812 Pingueculitis, left eye: Secondary | ICD-10-CM | POA: Diagnosis not present

## 2019-02-11 DIAGNOSIS — H11002 Unspecified pterygium of left eye: Secondary | ICD-10-CM | POA: Diagnosis not present

## 2019-02-11 DIAGNOSIS — H01003 Unspecified blepharitis right eye, unspecified eyelid: Secondary | ICD-10-CM | POA: Diagnosis not present

## 2019-02-28 DIAGNOSIS — R110 Nausea: Secondary | ICD-10-CM | POA: Diagnosis not present

## 2019-02-28 DIAGNOSIS — B349 Viral infection, unspecified: Secondary | ICD-10-CM | POA: Diagnosis not present

## 2019-03-24 DIAGNOSIS — J019 Acute sinusitis, unspecified: Secondary | ICD-10-CM | POA: Diagnosis not present

## 2019-04-06 DIAGNOSIS — J019 Acute sinusitis, unspecified: Secondary | ICD-10-CM | POA: Diagnosis not present

## 2019-04-08 ENCOUNTER — Other Ambulatory Visit: Payer: Self-pay | Admitting: Cardiology

## 2019-04-28 DIAGNOSIS — E119 Type 2 diabetes mellitus without complications: Secondary | ICD-10-CM | POA: Diagnosis not present

## 2019-04-28 DIAGNOSIS — I10 Essential (primary) hypertension: Secondary | ICD-10-CM | POA: Diagnosis not present

## 2019-04-28 DIAGNOSIS — E1169 Type 2 diabetes mellitus with other specified complication: Secondary | ICD-10-CM | POA: Diagnosis not present

## 2019-04-28 DIAGNOSIS — E782 Mixed hyperlipidemia: Secondary | ICD-10-CM | POA: Diagnosis not present

## 2019-05-02 DIAGNOSIS — F419 Anxiety disorder, unspecified: Secondary | ICD-10-CM | POA: Diagnosis not present

## 2019-05-02 DIAGNOSIS — E1169 Type 2 diabetes mellitus with other specified complication: Secondary | ICD-10-CM | POA: Diagnosis not present

## 2019-05-02 DIAGNOSIS — K219 Gastro-esophageal reflux disease without esophagitis: Secondary | ICD-10-CM | POA: Diagnosis not present

## 2019-05-02 DIAGNOSIS — I2510 Atherosclerotic heart disease of native coronary artery without angina pectoris: Secondary | ICD-10-CM | POA: Diagnosis not present

## 2019-05-02 DIAGNOSIS — E782 Mixed hyperlipidemia: Secondary | ICD-10-CM | POA: Diagnosis not present

## 2019-05-02 DIAGNOSIS — I10 Essential (primary) hypertension: Secondary | ICD-10-CM | POA: Diagnosis not present

## 2019-05-02 DIAGNOSIS — G4709 Other insomnia: Secondary | ICD-10-CM | POA: Diagnosis not present

## 2019-06-08 ENCOUNTER — Ambulatory Visit: Payer: Medicare Other | Admitting: Cardiology

## 2019-06-22 DIAGNOSIS — Z9181 History of falling: Secondary | ICD-10-CM | POA: Diagnosis not present

## 2019-06-22 DIAGNOSIS — I10 Essential (primary) hypertension: Secondary | ICD-10-CM | POA: Diagnosis not present

## 2019-06-22 DIAGNOSIS — M79604 Pain in right leg: Secondary | ICD-10-CM | POA: Diagnosis not present

## 2019-06-22 DIAGNOSIS — J019 Acute sinusitis, unspecified: Secondary | ICD-10-CM | POA: Diagnosis not present

## 2019-07-25 DIAGNOSIS — M79604 Pain in right leg: Secondary | ICD-10-CM | POA: Diagnosis not present

## 2019-07-25 DIAGNOSIS — M79605 Pain in left leg: Secondary | ICD-10-CM | POA: Diagnosis not present

## 2019-07-25 DIAGNOSIS — M545 Low back pain: Secondary | ICD-10-CM | POA: Diagnosis not present

## 2019-08-01 DIAGNOSIS — I480 Paroxysmal atrial fibrillation: Secondary | ICD-10-CM | POA: Diagnosis not present

## 2019-08-01 DIAGNOSIS — G4700 Insomnia, unspecified: Secondary | ICD-10-CM | POA: Diagnosis not present

## 2019-08-01 DIAGNOSIS — I10 Essential (primary) hypertension: Secondary | ICD-10-CM | POA: Diagnosis not present

## 2019-08-01 DIAGNOSIS — R600 Localized edema: Secondary | ICD-10-CM | POA: Diagnosis not present

## 2019-08-01 DIAGNOSIS — I2510 Atherosclerotic heart disease of native coronary artery without angina pectoris: Secondary | ICD-10-CM | POA: Diagnosis not present

## 2019-08-01 DIAGNOSIS — E782 Mixed hyperlipidemia: Secondary | ICD-10-CM | POA: Diagnosis not present

## 2019-08-01 DIAGNOSIS — K219 Gastro-esophageal reflux disease without esophagitis: Secondary | ICD-10-CM | POA: Diagnosis not present

## 2019-08-01 DIAGNOSIS — F419 Anxiety disorder, unspecified: Secondary | ICD-10-CM | POA: Diagnosis not present

## 2019-08-01 DIAGNOSIS — E119 Type 2 diabetes mellitus without complications: Secondary | ICD-10-CM | POA: Diagnosis not present

## 2019-08-01 DIAGNOSIS — E1169 Type 2 diabetes mellitus with other specified complication: Secondary | ICD-10-CM | POA: Diagnosis not present

## 2019-08-10 ENCOUNTER — Ambulatory Visit: Payer: Medicare Other | Admitting: Cardiology

## 2019-09-05 DIAGNOSIS — F419 Anxiety disorder, unspecified: Secondary | ICD-10-CM | POA: Diagnosis not present

## 2019-09-05 DIAGNOSIS — R600 Localized edema: Secondary | ICD-10-CM | POA: Diagnosis not present

## 2019-09-05 DIAGNOSIS — E782 Mixed hyperlipidemia: Secondary | ICD-10-CM | POA: Diagnosis not present

## 2019-09-05 DIAGNOSIS — I480 Paroxysmal atrial fibrillation: Secondary | ICD-10-CM | POA: Diagnosis not present

## 2019-09-05 DIAGNOSIS — G4700 Insomnia, unspecified: Secondary | ICD-10-CM | POA: Diagnosis not present

## 2019-09-05 DIAGNOSIS — E1169 Type 2 diabetes mellitus with other specified complication: Secondary | ICD-10-CM | POA: Diagnosis not present

## 2019-09-05 DIAGNOSIS — E119 Type 2 diabetes mellitus without complications: Secondary | ICD-10-CM | POA: Diagnosis not present

## 2019-09-05 DIAGNOSIS — I10 Essential (primary) hypertension: Secondary | ICD-10-CM | POA: Diagnosis not present

## 2019-09-05 DIAGNOSIS — I2510 Atherosclerotic heart disease of native coronary artery without angina pectoris: Secondary | ICD-10-CM | POA: Diagnosis not present

## 2019-09-05 DIAGNOSIS — K219 Gastro-esophageal reflux disease without esophagitis: Secondary | ICD-10-CM | POA: Diagnosis not present

## 2019-09-08 DIAGNOSIS — Z23 Encounter for immunization: Secondary | ICD-10-CM | POA: Diagnosis not present

## 2019-09-20 ENCOUNTER — Ambulatory Visit: Payer: Medicare Other | Admitting: Cardiology

## 2019-09-20 NOTE — Progress Notes (Deleted)
Cardiology Office Note  Date: 09/20/2019   ID: Alejandro [name] 02-10-1941, MRN [id number]  PCP:  Celene Squibb, MD  Cardiologist:  Rozann Lesches, MD Electrophysiologist:  None   No chief complaint on file.   History of Present Illness: Alejandro [name] is a 78 y.o. male last seen in December 2019.  He completed cardiac rehabilitation last year.  We discussed obtaining interval lab work from Dr. Nevada Crane.  Past Medical History:  Diagnosis Date  . Aortic insufficiency    a. mild-mod by TEE 05/2018 (no aortic stenosis).  Marland Kitchen CAD (coronary artery disease)    a. NSTEMI 05/2018 with multivessel disease -> s/p CABGx4 05/11/18.  Marland Kitchen Chronic diastolic CHF (congestive heart failure) ([place])    a. dx 05/2018.  . CKD (chronic kidney disease), stage III ([place])   . History of nephrolithiasis 2004  . Hyperlipidemia   . Hypertension   . IBS (irritable colon syndrome)   . Postoperative atrial fibrillation ([place])   . PVC's (premature ventricular contractions)   . Reflux   . Type 2 diabetes mellitus ([place])     Past Surgical History:  Procedure Laterality Date  . CORONARY ARTERY BYPASS GRAFT N/A 05/11/2018   Procedure: CORONARY ARTERY BYPASS GRAFTING (CABG), on pump, times four, using left internal mammary artery and endoscopically harvested right greater saphenous leg vein.;  Surgeon: Ivin Poot, MD;  Location: [facility];  Service: Open Heart Surgery;  Laterality: N/A;  . ESOPHAGOGASTRODUODENOSCOPY  02/04/2012   Procedure: ESOPHAGOGASTRODUODENOSCOPY (EGD);  Surgeon: Rogene Houston, MD;  [facility];  Service: Endoscopy;  Laterality: N/A;  100  . LEFT HEART CATH AND CORONARY ANGIOGRAPHY N/A 05/04/2018   Procedure: LEFT HEART CATH AND CORONARY ANGIOGRAPHY;  Surgeon: Belva Crome, MD;  Location: [facility];  Service: Cardiovascular;  Laterality: N/A;  . NASAL FRACTURE SURGERY    . TEE WITHOUT CARDIOVERSION N/A 05/11/2018   Procedure: TRANSESOPHAGEAL ECHOCARDIOGRAM (TEE);  Surgeon:  Prescott Gum, Collier Salina, MD;  Location: [facility];  Service: Open Heart Surgery;  Laterality: N/A;    Current Outpatient Medications  Medication Sig Dispense Refill  . ALPRAZolam (XANAX) 0.5 MG tablet Take 0.5 mg by mouth at bedtime as needed for sleep.    Marland Kitchen aspirin 325 MG EC tablet Take 1 tablet (325 mg total) by mouth daily. 30 tablet 0  . atorvastatin (LIPITOR) 80 MG tablet TAKE 1 TABLET BY MOUTH EVERY DAY AT 6 PM 30 tablet 6  . DEXILANT 60 MG capsule TAKE ONE CAPSULE BY MOUTH EVERY DAY. 30 capsule 5  . hydrALAZINE (APRESOLINE) 25 MG tablet Take 1 tablet (25 mg total) by mouth every 8 (eight) hours. 90 tablet 1  . metoprolol tartrate (LOPRESSOR) 50 MG tablet TAKE 1 TABLET BY MOUTH TWICE DAILY 180 tablet 2  . sitaGLIPtan-metformin (JANUMET) 50-1000 MG per tablet Take 1 tablet by mouth 2 (two) times daily with a meal.       No current facility-administered medications for this visit.    Allergies:  Patient has no known allergies.   Social History: The patient  reports that he has never smoked. He has never used smokeless tobacco. He reports that he does not drink alcohol or use drugs.   Family History: The patient's family history includes Arthritis in his unknown relative; Asthma in his unknown relative; Diabetes in his unknown relative; Diabetes Mellitus II in his mother; Heart attack in his brother and father; Heart disease in his unknown relative.  ROS:  Please see the history of present illness. Otherwise, complete review of systems is positive for {NONE DEFAULTED:18576::"none"}.  All other systems are reviewed and negative.   Physical Exam: VS:  There were no vitals taken for this visit., BMI There is no height or weight on file to calculate BMI.  Wt Readings from Last 3 Encounters:  11/10/18 236 lb (107 kg)  11/01/18 235 lb 7.2 oz (106.8 kg)  09/09/18 226 lb (102.5 kg)    General: Patient appears comfortable at rest. HEENT: Conjunctiva and lids normal, oropharynx clear with moist  mucosa. Neck: Supple, no elevated JVP or carotid bruits, no thyromegaly. Lungs: Clear to auscultation, nonlabored breathing at rest. Cardiac: Regular rate and rhythm, no S3 or significant systolic murmur, no pericardial rub. Abdomen: Soft, nontender, no hepatomegaly, bowel sounds present, no guarding or rebound. Extremities: No pitting edema, distal pulses 2+. Skin: Warm and dry. Musculoskeletal: No kyphosis. Neuropsychiatric: Alert and oriented x3, affect grossly appropriate.  ECG:  An ECG dated 06/11/2018 was personally reviewed today and demonstrated:  Sinus bradycardia with nonspecific ST-T changes.  Recent Labwork:  No interval lab work for review.  Other Studies Reviewed Today:  Intraoperative TEE 05/11/2018:  Aortic valve: The valve is trileaflet. Mild valve thickening present. Mild valve calcification present. Mildly decreased leaflet separation. No stenosis. Mild to moderate regurgitation. No AV vegetation.  Mitral valve: No leaflet thickening and calcification present. Mild mitral annular calcification. Trace regurgitation.  Right ventricle: Normal cavity size, wall thickness and ejection fraction. No thrombus present. No mass present.  Echocardiogram 05/03/2018: Study Conclusions  - Left ventricle: The cavity size was moderately dilated. Wall thickness was increased in a pattern of mild LVH. Systolic function was normal. The estimated ejection fraction was in the range of 50% to 55%. There is mild hypokinesis of the basal-midinferior myocardium. Features are consistent with a pseudonormal left ventricular filling pattern, with concomitant abnormal relaxation and increased filling pressure (grade 2 diastolic dysfunction). - Aortic valve: Mildly to moderately calcified annulus. Trileaflet; moderately calcified leaflets. Noncoronary cusp mobility was restricted. There was mild stenosis. There was trivial regurgitation. Mean gradient (S): 22 mm Hg.  Peak gradient (S): 39 mm Hg. VTI ratio of LVOT to aortic valve: 0.42. Valve area (VTI): 2.08 cm^2. Valve area (Vmax): 1.9 cm^2. Valve area (Vmean): 1.86 cm^2. - Mitral valve: Moderately calcified annulus. There was mild regurgitation. - Left atrium: The atrium was moderately dilated. - Right atrium: Central venous pressure (est): 8 mm Hg. - Atrial septum: No defect or patent foramen ovale was identified. - Tricuspid valve: There was trivial regurgitation. - Pulmonary arteries: Systolic pressure was moderately increased. PA peak pressure: 50 mm Hg (S). - Coronary sinus: The vessel was dilated. - Pericardium, extracardiac: There was no pericardial effusion.  Assessment and Plan:   Medication Adjustments/Labs and Tests Ordered: Current medicines are reviewed at length with the patient today.  Concerns regarding medicines are outlined above.   Tests Ordered: No orders of the defined types were placed in this encounter.   Medication Changes: No orders of the defined types were placed in this encounter.   Disposition:  Follow up {follow up:15908}  Signed, Satira Sark, MD, [facility] 09/20/2019 8:08 AM    [facility] [place]. [address] [place], [place] [address] Phone: [phone]; Fax: [phone]

## 2019-09-28 ENCOUNTER — Telehealth: Payer: Self-pay | Admitting: Cardiology

## 2019-09-28 NOTE — Telephone Encounter (Signed)

## 2019-09-29 NOTE — Progress Notes (Deleted)
{Choose 1 Note Type (Telehealth Visit or Telephone Visit):[phone]}   Date:  09/29/2019   ID:  [name], DOB Dec 19, 1940, MRN [id number]  {Patient [phone]::"Home"} {Provider [phone]::"Home"}  PCP:  Celene Squibb, MD  Cardiologist:  Rozann Lesches, MD Electrophysiologist:  None   Evaluation Performed:  {Choose Visit ONGE:[id number]::"[id number] Visit"}  Chief Complaint:  ***  History of Present Illness:    Alejandro [name] is a 78 y.o. male last seen in December 2019.  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  We discussed follow-up lab work.  The patient {does/does not:200015} have symptoms concerning for COVID-19 infection (fever, chills, cough, or new shortness of breath).    Past Medical History:  Diagnosis Date  . Aortic insufficiency    a. mild-mod by TEE 05/2018 (no aortic stenosis).  Marland Kitchen CAD (coronary artery disease)    a. NSTEMI 05/2018 with multivessel disease -> s/p CABGx4 05/11/18.  Marland Kitchen Chronic diastolic CHF (congestive heart failure) ([place])    a. dx 05/2018.  . CKD (chronic kidney disease), stage III ([place])   . History of nephrolithiasis 2004  . Hyperlipidemia   . Hypertension   . IBS (irritable colon syndrome)   . Postoperative atrial fibrillation ([place])   . PVC's (premature ventricular contractions)   . Reflux   . Type 2 diabetes mellitus ([place])    Past Surgical History:  Procedure Laterality Date  . CORONARY ARTERY BYPASS GRAFT N/A 05/11/2018   Procedure: CORONARY ARTERY BYPASS GRAFTING (CABG), on pump, times four, using left internal mammary artery and endoscopically harvested right greater saphenous leg vein.;  Surgeon: Ivin Poot, MD;  Location: [facility];  Service: Open Heart Surgery;  Laterality: N/A;  . ESOPHAGOGASTRODUODENOSCOPY  02/04/2012   Procedure: ESOPHAGOGASTRODUODENOSCOPY (EGD);  Surgeon: Rogene Houston, MD;  [facility];  Service: Endoscopy;  Laterality: N/A;  100  . LEFT HEART CATH AND CORONARY ANGIOGRAPHY N/A  05/04/2018   Procedure: LEFT HEART CATH AND CORONARY ANGIOGRAPHY;  Surgeon: Belva Crome, MD;  Location: [facility];  Service: Cardiovascular;  Laterality: N/A;  . NASAL FRACTURE SURGERY    . TEE WITHOUT CARDIOVERSION N/A 05/11/2018   Procedure: TRANSESOPHAGEAL ECHOCARDIOGRAM (TEE);  Surgeon: Prescott Gum, Collier Salina, MD;  Location: [facility];  Service: Open Heart Surgery;  Laterality: N/A;     No outpatient medications have been marked as taking for the 09/30/19 encounter (Appointment) with Satira Sark, MD.     Allergies:   Patient has no known allergies.   Social History   Tobacco Use  . Smoking status: Never Smoker  . Smokeless tobacco: Never Used  Substance Use Topics  . Alcohol use: No    Alcohol/week: 0.0 standard drinks  . Drug use: No     Family Hx: The patient's family history includes Arthritis in his unknown relative; Asthma in his unknown relative; Diabetes in his unknown relative; Diabetes Mellitus II in his mother; Heart attack in his brother and father; Heart disease in his unknown relative.  ROS:   Please see the history of present illness.    *** All other systems reviewed and are negative.   Prior CV studies:   The following studies were reviewed today:  Intraoperative TEE 05/11/2018:  Aortic valve: The valve is trileaflet. Mild valve thickening present. Mild valve calcification present. Mildly decreased leaflet separation. No stenosis. Mild to moderate regurgitation. No AV vegetation.  Mitral valve: No leaflet thickening and calcification present. Mild mitral annular calcification. Trace regurgitation.  Right ventricle: Normal cavity size,  wall thickness and ejection fraction. No thrombus present. No mass present.  Echocardiogram 05/03/2018: Study Conclusions  - Left ventricle: The cavity size was moderately dilated. Wall thickness was increased in a pattern of mild LVH. Systolic function was normal. The estimated ejection fraction was in  the range of 50% to 55%. There is mild hypokinesis of the basal-midinferior myocardium. Features are consistent with a pseudonormal left ventricular filling pattern, with concomitant abnormal relaxation and increased filling pressure (grade 2 diastolic dysfunction). - Aortic valve: Mildly to moderately calcified annulus. Trileaflet; moderately calcified leaflets. Noncoronary cusp mobility was restricted. There was mild stenosis. There was trivial regurgitation. Mean gradient (S): 22 mm Hg. Peak gradient (S): 39 mm Hg. VTI ratio of LVOT to aortic valve: 0.42. Valve area (VTI): 2.08 cm^2. Valve area (Vmax): 1.9 cm^2. Valve area (Vmean): 1.86 cm^2. - Mitral valve: Moderately calcified annulus. There was mild regurgitation. - Left atrium: The atrium was moderately dilated. - Right atrium: Central venous pressure (est): 8 mm Hg. - Atrial septum: No defect or patent foramen ovale was identified. - Tricuspid valve: There was trivial regurgitation. - Pulmonary arteries: Systolic pressure was moderately increased. PA peak pressure: 50 mm Hg (S). - Coronary sinus: The vessel was dilated. - Pericardium, extracardiac: There was no pericardial effusion.  Labs/Other Tests and Data Reviewed:    EKG:  An ECG dated 06/11/2018 was personally reviewed today and demonstrated:  Sinus bradycardia with nonspecific ST-T changes.  Recent Labs:  06/11/2018: BUN 24; Creatinine, Ser 1.49; Hemoglobin 12.9; Platelets 254; Potassium 4.7; Sodium 133; TSH 4.103   Wt Readings from Last 3 Encounters:  11/10/18 236 lb (107 kg)  11/01/18 235 lb 7.2 oz (106.8 kg)  09/09/18 226 lb (102.5 kg)     Objective:    Vital Signs:  There were no vitals taken for this visit.   {HeartCare Virtual Exam (Optional):[phone]::"VITAL SIGNS:  reviewed"}  ASSESSMENT & PLAN:    1. ***  COVID-19 Education: The signs and symptoms of COVID-19 were discussed with the patient and how to seek care for  testing (follow up with PCP or arrange E-visit).  ***The importance of social distancing was discussed today.  Time:   Today, I have spent *** minutes with the patient with telehealth technology discussing the above problems.     Medication Adjustments/Labs and Tests Ordered: Current medicines are reviewed at length with the patient today.  Concerns regarding medicines are outlined above.   Tests Ordered: No orders of the defined types were placed in this encounter.   Medication Changes: No orders of the defined types were placed in this encounter.   Follow Up:  {F/U [phone]} {follow up:15908}  Signed, Rozann Lesches, MD  09/29/2019 7:35 PM    [place]

## 2019-09-30 ENCOUNTER — Telehealth: Payer: Medicare Other | Admitting: Cardiology

## 2019-10-21 ENCOUNTER — Other Ambulatory Visit: Payer: Self-pay

## 2019-10-31 DIAGNOSIS — R0981 Nasal congestion: Secondary | ICD-10-CM | POA: Diagnosis not present

## 2019-10-31 DIAGNOSIS — R05 Cough: Secondary | ICD-10-CM | POA: Diagnosis not present

## 2019-11-03 ENCOUNTER — Ambulatory Visit: Payer: Medicare Other | Admitting: Cardiology

## 2019-11-08 DIAGNOSIS — J069 Acute upper respiratory infection, unspecified: Secondary | ICD-10-CM | POA: Diagnosis not present

## 2019-11-08 DIAGNOSIS — E782 Mixed hyperlipidemia: Secondary | ICD-10-CM | POA: Diagnosis not present

## 2019-11-08 DIAGNOSIS — E119 Type 2 diabetes mellitus without complications: Secondary | ICD-10-CM | POA: Diagnosis not present

## 2019-11-08 DIAGNOSIS — I10 Essential (primary) hypertension: Secondary | ICD-10-CM | POA: Diagnosis not present

## 2019-11-08 DIAGNOSIS — E291 Testicular hypofunction: Secondary | ICD-10-CM | POA: Diagnosis not present

## 2019-11-08 DIAGNOSIS — E1169 Type 2 diabetes mellitus with other specified complication: Secondary | ICD-10-CM | POA: Diagnosis not present

## 2019-11-08 DIAGNOSIS — R0981 Nasal congestion: Secondary | ICD-10-CM | POA: Diagnosis not present

## 2019-11-08 DIAGNOSIS — R05 Cough: Secondary | ICD-10-CM | POA: Diagnosis not present

## 2019-11-11 DIAGNOSIS — I10 Essential (primary) hypertension: Secondary | ICD-10-CM | POA: Diagnosis not present

## 2019-11-11 DIAGNOSIS — G4709 Other insomnia: Secondary | ICD-10-CM | POA: Diagnosis not present

## 2019-11-11 DIAGNOSIS — E1169 Type 2 diabetes mellitus with other specified complication: Secondary | ICD-10-CM | POA: Diagnosis not present

## 2019-11-11 DIAGNOSIS — E782 Mixed hyperlipidemia: Secondary | ICD-10-CM | POA: Diagnosis not present

## 2019-11-11 DIAGNOSIS — I2510 Atherosclerotic heart disease of native coronary artery without angina pectoris: Secondary | ICD-10-CM | POA: Diagnosis not present

## 2019-11-11 DIAGNOSIS — K219 Gastro-esophageal reflux disease without esophagitis: Secondary | ICD-10-CM | POA: Diagnosis not present

## 2019-12-06 ENCOUNTER — Encounter: Payer: 59 | Admitting: Cardiology

## 2019-12-06 ENCOUNTER — Encounter: Payer: Self-pay | Admitting: Cardiology

## 2019-12-07 NOTE — Progress Notes (Signed)
This encounter was created in error - please disregard.

## 2019-12-08 ENCOUNTER — Encounter: Payer: Self-pay | Admitting: Cardiology

## 2019-12-15 ENCOUNTER — Encounter: Payer: Self-pay | Admitting: Physician Assistant

## 2019-12-15 NOTE — Progress Notes (Signed)
Cardiology Office Note    Date:  12/20/2019   ID:  Alejandro [name], DOB [birth year], MRN [id number]  PCP:  Celene Squibb, MD  Cardiologist:  Rozann Lesches, MD  Electrophysiologist:  None   Chief Complaint: routine f/u CAD, CHF (last OV 10/2018)  History of Present Illness:   Alejandro [name] is a 79 y.o. male with history of hypertension, diabetes, CKD stage III, IBS, Lyme Disease, CAD s/p CABG 05/176, chronic diastolic CHF, post-op atrial fib, PVCs, aortic stenosis/aortic insufficiency, who presents for routine cardiac follow-up.  To recap history, he was admitted 05/2018 with SOB. He thought it was due to recently diagnosed Lyme disease for which he was started on doxycycline. He was found to have acute heart failure with pleural effusions/pulmonary edema and NSTEMI. Creatinine was elevated suspected due to CKD but no prior to compare to. 2D echo 05/03/18 showed mild LVH, EF 50-55%, mild hypokinesis of thebasal-midinferior myocardium, grade 2 diastolic dysfunction, trileaflet AV with mild aortic stenosis (referenced as moderate in echo 05/2018), mild mitral regurgitation, moderately dilated left atrium, moderately increased PA systolic pressure. Cardiac cath 05/04/18 showed multivessel CAD. LVEDP was 19mmHg prompting continued diuresis with Lasix. Pre-op carotids showed 1-39% bilateral stenosis, resting R/L ABIs wnl. He underwent CABGx4 on 05/11/18 (left internal mammary artery to left anterior descending, saphenous vein graft to diagonal, saphenous vein graft to the obtuse marginal, saphenous vein graft to posterior descending). Intra-op TEE showed no aortic stenosis, but mild-moderate aortic insufficiency. Post-procedure course notable for fluid overload with standard diuresis, frequent PVCs, and post-op afib requiring amiodarone initiation. He was not felt to require anticoagulation. Outside labs personally reviewed include: 05/2018 normal TSH, Hgb 12.9, Na 133, Cr 1.49, 05/2018 LFTs wnl. No recent  LFTs/lipids on file although he reports getting labwork routinely at PCP (not in Epic). He last saw Dr. [name] in 10/2018 and was felt to be doing well.  He returns for follow-up today feeling well. He does report the pandemic and social isolation has been hard. He's been gaining weight from poor eating habits. He does mention that over the weekend he went to [place] and ate at a [employer]. On Sunday he felt like his stomach was bloated/consipated and also felt more winded with exertion. He noticed some light vague chest pains that would come and go, lasting max 10 minutes, completely unlike prior angina - he has a hard time describing them as they were mild. The chest pain improved with getting up and moving around. He reports he moved his bowels yesterday and since then feels completely back to normal. No fevers, chills, orthopnea, edema. BP running high today - states it always runs that way in the doctors office but "normal" at home, although cannot recall specific values. He's been trying to walk a mile several days a week and typically does so without issue (except Sunday). He has not yet tried to do so yesterday or today but walked from parking lot without issue.  Past Medical History:  Diagnosis Date  . Aortic insufficiency    a. mild-mod by intraop TEE 05/2018 (no aortic stenosis).  Marland Kitchen CAD (coronary artery disease)    a. NSTEMI 05/2018 with multivessel disease -> s/p CABGx4 05/11/18.  Marland Kitchen Chronic diastolic CHF (congestive heart failure) ([place])    a. dx 05/2018.  . CKD (chronic kidney disease), stage III   . History of nephrolithiasis 2004  . Hyperlipidemia   . Hypertension   . IBS (irritable colon syndrome)   . Postoperative  atrial fibrillation ([place])   . PVC's (premature ventricular contractions)   . Reflux   . Type 2 diabetes mellitus ()     Past Surgical History:  Procedure Laterality Date  . CORONARY ARTERY BYPASS GRAFT N/A 05/11/2018   Procedure: CORONARY ARTERY BYPASS  GRAFTING (CABG), on pump, times four, using left internal mammary artery and endoscopically harvested right greater saphenous leg vein.;  Surgeon: Ivin Poot, MD;  Location: [facility];  Service: Open Heart Surgery;  Laterality: N/A;  . ESOPHAGOGASTRODUODENOSCOPY  02/04/2012   Procedure: ESOPHAGOGASTRODUODENOSCOPY (EGD);  Surgeon: Rogene Houston, MD;  [facility];  Service: Endoscopy;  Laterality: N/A;  100  . LEFT HEART CATH AND CORONARY ANGIOGRAPHY N/A 05/04/2018   Procedure: LEFT HEART CATH AND CORONARY ANGIOGRAPHY;  Surgeon: Belva Crome, MD;  Location: [facility];  Service: Cardiovascular;  Laterality: N/A;  . NASAL FRACTURE SURGERY    . TEE WITHOUT CARDIOVERSION N/A 05/11/2018   Procedure: TRANSESOPHAGEAL ECHOCARDIOGRAM (TEE);  Surgeon: Prescott Gum, Collier Salina, MD;  Location: [facility];  Service: Open Heart Surgery;  Laterality: N/A;    Current Medications: Current Meds  Medication Sig  . ALPRAZolam (XANAX) 0.5 MG tablet Take 0.5 mg by mouth at bedtime as needed for sleep.  Marland Kitchen aspirin 325 MG EC tablet Take 1 tablet (325 mg total) by mouth daily.  Marland Kitchen atorvastatin (LIPITOR) 80 MG tablet TAKE 1 TABLET BY MOUTH EVERY DAY AT 6 PM  . DEXILANT 60 MG capsule TAKE ONE CAPSULE BY MOUTH EVERY DAY.  . hydrALAZINE (APRESOLINE) 25 MG tablet Take 1 tablet (25 mg total) by mouth every 8 (eight) hours.  . metoprolol tartrate (LOPRESSOR) 50 MG tablet TAKE 1 TABLET BY MOUTH TWICE DAILY  . sitaGLIPtan-metformin (JANUMET) 50-1000 MG per tablet Take 1 tablet by mouth 2 (two) times daily with a meal.       Allergies:   Patient has no known allergies.   Social History   Socioeconomic History  . Marital status: Married    Spouse name: Not on file  . Number of children: Not on file  . Years of education: Not on file  . Highest education level: Not on file  Occupational History  . Not on file  Tobacco Use  . Smoking status: Never Smoker  . Smokeless tobacco: Never Used  Substance and Sexual  Activity  . Alcohol use: No    Alcohol/week: 0.0 standard drinks  . Drug use: No  . Sexual activity: Yes  Other Topics Concern  . Not on file  Social History Narrative  . Not on file   Social Determinants of Health   Financial Resource Strain:   . Difficulty of Paying Living Expenses: Not on file  Food Insecurity:   . Worried About Charity fundraiser in the Last Year: Not on file  . Ran Out of Food in the Last Year: Not on file  Transportation Needs:   . Lack of Transportation (Medical): Not on file  . Lack of Transportation (Non-Medical): Not on file  Physical Activity:   . Days of Exercise per Week: Not on file  . Minutes of Exercise per Session: Not on file  Stress:   . Feeling of Stress : Not on file  Social Connections:   . Frequency of Communication with Friends and Family: Not on file  . Frequency of Social Gatherings with Friends and Family: Not on file  . Attends Religious Services: Not on file  . Active Member of Clubs  or Organizations: Not on file  . Attends Archivist Meetings: Not on file  . Marital Status: Not on file     Family History:  The patient's family history includes Arthritis in an other family member; Asthma in an other family member; Diabetes in an other family member; Diabetes Mellitus II in his mother; Heart attack in his brother and father; Heart disease in an other family member.  ROS:   Please see the history of present illness. No cough, fever, chills. All other systems are reviewed and otherwise negative.    EKGs/Labs/Other Studies Reviewed:    Studies reviewed are outlined and summarized above.  Intraoperative TEE 05/11/2018:  Aortic valve: The valve is trileaflet. Mild valve thickening present. Mild valve calcification present. Mildly decreased leaflet separation. No stenosis. Mild to moderate regurgitation. No AV vegetation.  Mitral valve: No leaflet thickening and calcification present. Mild mitral annular calcification.  Trace regurgitation.  Right ventricle: Normal cavity size, wall thickness and ejection fraction. No thrombus present. No mass present.  Echocardiogram 05/03/2018: Study Conclusions  - Left ventricle: The cavity size was moderately dilated. Wall thickness was increased in a pattern of mild LVH. Systolic function was normal. The estimated ejection fraction was in the range of 50% to 55%. There is mild hypokinesis of the basal-midinferior myocardium. Features are consistent with a pseudonormal left ventricular filling pattern, with concomitant abnormal relaxation and increased filling pressure (grade 2 diastolic dysfunction). - Aortic valve: Mildly to moderately calcified annulus. Trileaflet; moderately calcified leaflets. Noncoronary cusp mobility was restricted. There was mild stenosis. There was trivial regurgitation. Mean gradient (S): 22 mm Hg. Peak gradient (S): 39 mm Hg. VTI ratio of LVOT to aortic valve: 0.42. Valve area (VTI): 2.08 cm^2. Valve area (Vmax): 1.9 cm^2. Valve area (Vmean): 1.86 cm^2. - Mitral valve: Moderately calcified annulus. There was mild regurgitation. - Left atrium: The atrium was moderately dilated. - Right atrium: Central venous pressure (est): 8 mm Hg. - Atrial septum: No defect or patent foramen ovale was identified. - Tricuspid valve: There was trivial regurgitation. - Pulmonary arteries: Systolic pressure was moderately increased. PA peak pressure: 50 mm Hg (S). - Coronary sinus: The vessel was dilated. - Pericardium, extracardiac: There was no pericardial effusion.    EKG:  EKG is ordered today, personally reviewed, demonstrating NSR 63bpm, first degree AVB, nonspecific STT changes similar to prior.  Recent Labs: No results found for requested labs within last 8760 hours.  Recent Lipid Panel No results found for: CHOL, TRIG, HDL, CHOLHDL, VLDL, LDLCALC, LDLDIRECT  PHYSICAL EXAM:    VS:  BP (!) 176/81   Pulse 63    Temp 99.3 F (37.4 C) (Temporal)   Ht 5\' 11"  (1.803 m)   Wt 254 lb (115.2 kg)   SpO2 98%   BMI 35.43 kg/m   BMI: Body mass index is 35.43 kg/m.  GEN: Well nourished, well developed obese WM, in no acute distress HEENT: normocephalic, atraumatic Neck: no JVD, carotid bruits, or masses Cardiac: RRR; 3/6 SEM without rubs or gallops, no edema  Respiratory:  clear to auscultation bilaterally, normal work of breathing GI: soft, nontender, nondistended, + BS - abdominal bruit heard midline MS: no deformity or atrophy Skin: warm and dry, no rash Neuro:  Alert and Oriented x 3, Strength and sensation are intact, follows commands Psych: euthymic mood, full affect  Wt Readings from Last 3 Encounters:  12/20/19 254 lb (115.2 kg)  11/10/18 236 lb (107 kg)  11/01/18 235 lb 7.2 oz (106.8  kg)     ASSESSMENT & PLAN:   1. Shortness of breath/chest discomfort - occurred in context of overindulging at [place] buffet, resolved with moving his bowels. I wonder if perhaps the salt load drove his blood pressure up with perhaps some vascular congestion. Chest pain was somewhat atypical in that it improved with ambulation and bowel movement. He feels totally back to baseline today. Will arrange labwork today (CMET, CBC, TSH, BNP) to exclude any major changes from prior. Will also update echocardiogram as he is due given his history of AS/AI. His murmur is easily heard on examination today. He is adamant that his BP is normal at home. I have given him instructions on checking this at home for the next few days and reporting 2-3 days of readings for our review. If it remains high, plan to optimize is regimen and follow symptoms. Plan early virtual follow-up to ensure clinical stability. Warning sx reviewed with patient, he will notify for any recurrence. 2. CAD s/p CABG - he has been maintained on full dose aspirin. Will await echocardiogram/reassessment of symptoms. Consider decreasing to 81mg  daily at time of  follow-up if no further episodes of discomfort. 3. Post-op atrial fibrillation - quiescent. Continue to monitor for recurrence. If he has another episode like Sunday, event monitoring could be considered to exclude recurrent arrhythmia. 4. Chronic diastolic CHF (with underlying CKD stage III) - reviewed low sodium diet with instructions. F/u labs today. His weight is up but he attributes this primarily to poor eating habits in general. He is still trying to walk a mile daily. 5. Essential HTN - BP elevated. See management as above. Await home BP readings before making any changes. The patient is adamant it is normal at home. 6. Hyperlipidemia - requested office staff try to obtain copy of labs from PCP for our review. 7. Aortic insufficiency / ? aortic stenosis - his exam is consistent with aortic stenosis. This has been as much as moderate in the past although intra-op TEE in 05/2018 showed only mild-moderate aortic insufficiency. Await echocardiogram as above. 8. Abdominal bruit - question abdominal aortic disease vs radiation from aortic murmur. Recommend abd aortic duplex.  Disposition: F/u with Dr. McDowell/[facility] in 1 week phone virtual visit to discuss studies and blood pressure; reassess symptoms.   Medication Adjustments/Labs and Tests Ordered: Current medicines are reviewed at length with the patient today.  Concerns regarding medicines are outlined above. Medication changes, Labs and Tests ordered today are summarized above and listed in the Patient Instructions accessible in Encounters.   Signed, Charlie Pitter, PA-C  12/20/2019 1:25 PM    [facility] Location in [place]. [place], [place] [address] Ph: [phone]; Fax [phone]

## 2019-12-20 ENCOUNTER — Encounter: Payer: Self-pay | Admitting: Physician Assistant

## 2019-12-20 ENCOUNTER — Ambulatory Visit (INDEPENDENT_AMBULATORY_CARE_PROVIDER_SITE_OTHER): Payer: Medicare HMO | Admitting: Physician Assistant

## 2019-12-20 VITALS — BP 176/81 | HR 63 | Temp 99.3°F | Ht 71.0 in | Wt 254.0 lb

## 2019-12-20 DIAGNOSIS — I4891 Unspecified atrial fibrillation: Secondary | ICD-10-CM | POA: Diagnosis not present

## 2019-12-20 DIAGNOSIS — R0789 Other chest pain: Secondary | ICD-10-CM | POA: Diagnosis not present

## 2019-12-20 DIAGNOSIS — I10 Essential (primary) hypertension: Secondary | ICD-10-CM

## 2019-12-20 DIAGNOSIS — I5032 Chronic diastolic (congestive) heart failure: Secondary | ICD-10-CM

## 2019-12-20 DIAGNOSIS — I2510 Atherosclerotic heart disease of native coronary artery without angina pectoris: Secondary | ICD-10-CM | POA: Diagnosis not present

## 2019-12-20 DIAGNOSIS — I351 Nonrheumatic aortic (valve) insufficiency: Secondary | ICD-10-CM

## 2019-12-20 DIAGNOSIS — I9789 Other postprocedural complications and disorders of the circulatory system, not elsewhere classified: Secondary | ICD-10-CM | POA: Diagnosis not present

## 2019-12-20 DIAGNOSIS — R0989 Other specified symptoms and signs involving the circulatory and respiratory systems: Secondary | ICD-10-CM

## 2019-12-20 DIAGNOSIS — R0602 Shortness of breath: Secondary | ICD-10-CM | POA: Diagnosis not present

## 2019-12-20 DIAGNOSIS — E785 Hyperlipidemia, unspecified: Secondary | ICD-10-CM

## 2019-12-20 NOTE — Patient Instructions (Addendum)
Medication Instructions:  Your physician recommends that you continue on your current medications as directed. Please refer to the Current Medication list given to you today.    Labwork: TODAY   Testing/Procedures: Your physician has requested that you have an echocardiogram. Echocardiography is a painless test that uses sound waves to create images of your heart. It provides your doctor with information about the size and shape of your heart and how well your heart's chambers and valves are working. This procedure takes approximately one hour. There are no restrictions for this procedure.  ABDOMINAL DUPLEX ([place])   Follow-Up: Your physician recommends that you schedule a follow-up appointment in: 1 WEEK AFTER TEST FOR VIRTUAL VISIT    Any Other Special Instructions Will Be Listed Below (If Applicable).     If you need a refill on your cardiac medications before your next appointment, please call your pharmacy.   Please get a blood pressure cuff that goes on your arm. The wrist ones can be inaccurate. If possible, try to select one that also reports your heart rate. To check your blood pressure, choose a time about 3 hours after taking your blood pressure medicines. Remain seated in a chair for 5 minutes quietly beforehand, then check it. When you get a cuff, please get those readings for 2-3 days and call us/send in MyChart message with them for our review.   DASH Eating Plan DASH stands for "Dietary Approaches to Stop Hypertension." The DASH eating plan is a healthy eating plan that has been shown to reduce high blood pressure (hypertension). It may also reduce your risk for type 2 diabetes, heart disease, and stroke. The DASH eating plan may also help with weight loss. What are tips for following this plan?  General guidelines  Avoid eating more than 2,300 mg (milligrams) of salt (sodium) a day. If you have hypertension, you may need to reduce your sodium intake to 1,500 mg a  day.  Limit alcohol intake to no more than 1 drink a day for nonpregnant women and 2 drinks a day for men. One drink equals 12 oz of beer, 5 oz of wine, or 1 oz of hard liquor.  Work with your health care provider to maintain a healthy body weight or to lose weight. Ask what an ideal weight is for you.  Get at least 30 minutes of exercise that causes your heart to beat faster (aerobic exercise) most days of the week. Activities may include walking, swimming, or biking.  Work with your health care provider or diet and nutrition specialist (dietitian) to adjust your eating plan to your individual calorie needs. Reading food labels   Check food labels for the amount of sodium per serving. Choose foods with less than 5 percent of the Daily Value of sodium. Generally, foods with less than 300 mg of sodium per serving fit into this eating plan.  To find whole grains, look for the word "whole" as the first word in the ingredient list. Shopping  Buy products labeled as "low-sodium" or "no salt added."  Buy fresh foods. Avoid canned foods and premade or frozen meals. Cooking  Avoid adding salt when cooking. Use salt-free seasonings or herbs instead of table salt or sea salt. Check with your health care provider or pharmacist before using salt substitutes.  Do not fry foods. Cook foods using healthy methods such as baking, boiling, grilling, and broiling instead.  Cook with heart-healthy oils, such as olive, canola, soybean, or sunflower oil. Meal planning  Eat a balanced diet that includes: ? 5 or more servings of fruits and vegetables each day. At each meal, try to fill half of your plate with fruits and vegetables. ? Up to 6-8 servings of whole grains each day. ? Less than 6 oz of lean meat, poultry, or fish each day. A 3-oz serving of meat is about the same size as a deck of cards. One egg equals 1 oz. ? 2 servings of low-fat dairy each day. ? A serving of nuts, seeds, or beans 5 times  each week. ? Heart-healthy fats. Healthy fats called Omega-3 fatty acids are found in foods such as flaxseeds and coldwater fish, like sardines, salmon, and mackerel.  Limit how much you eat of the following: ? Canned or prepackaged foods. ? Food that is high in trans fat, such as fried foods. ? Food that is high in saturated fat, such as fatty meat. ? Sweets, desserts, sugary drinks, and other foods with added sugar. ? Full-fat dairy products.  Do not salt foods before eating.  Try to eat at least 2 vegetarian meals each week.  Eat more home-cooked food and less restaurant, buffet, and fast food.  When eating at a restaurant, ask that your food be prepared with less salt or no salt, if possible. What foods are recommended? The items listed may not be a complete list. Talk with your dietitian about what dietary choices are best for you. Grains Whole-grain or whole-wheat bread. Whole-grain or whole-wheat pasta. Brown rice. [place]. Bulgur. Whole-grain and low-sodium cereals. Pita bread. Low-fat, low-sodium crackers. Whole-wheat flour tortillas. Vegetables Fresh or frozen vegetables (raw, steamed, roasted, or grilled). Low-sodium or reduced-sodium tomato and vegetable juice. Low-sodium or reduced-sodium tomato sauce and tomato paste. Low-sodium or reduced-sodium canned vegetables. Fruits All fresh, dried, or frozen fruit. Canned fruit in natural juice (without added sugar). Meat and other protein foods Skinless chicken or [place]. Ground chicken or [place]. Pork with fat trimmed off. Fish and seafood. Egg whites. Dried beans, peas, or lentils. Unsalted nuts, nut butters, and seeds. Unsalted canned beans. Lean cuts of beef with fat trimmed off. Low-sodium, lean deli meat. Dairy Low-fat (1%) or fat-free (skim) milk. Fat-free, low-fat, or reduced-fat cheeses. Nonfat, low-sodium ricotta or cottage cheese. Low-fat or nonfat yogurt. Low-fat, low-sodium cheese. Fats and oils Soft margarine  without trans fats. Vegetable oil. Low-fat, reduced-fat, or light mayonnaise and salad dressings (reduced-sodium). Canola, safflower, olive, soybean, and sunflower oils. Avocado. Seasoning and other foods Herbs. Spices. Seasoning mixes without salt. Unsalted popcorn and pretzels. Fat-free sweets. What foods are not recommended? The items listed may not be a complete list. Talk with your dietitian about what dietary choices are best for you. Grains Baked goods made with fat, such as croissants, muffins, or some breads. Dry pasta or rice meal packs. Vegetables Creamed or fried vegetables. Vegetables in a cheese sauce. Regular canned vegetables (not low-sodium or reduced-sodium). Regular canned tomato sauce and paste (not low-sodium or reduced-sodium). Regular tomato and vegetable juice (not low-sodium or reduced-sodium). Angie Fava. Olives. Fruits Canned fruit in a light or heavy syrup. Fried fruit. Fruit in cream or butter sauce. Meat and other protein foods Fatty cuts of meat. Ribs. Fried meat. Berniece Salines. Sausage. Bologna and other processed lunch meats. Salami. Fatback. Hotdogs. Bratwurst. Salted nuts and seeds. Canned beans with added salt. Canned or smoked fish. Whole eggs or egg yolks. Chicken or [place] with skin. Dairy Whole or 2% milk, cream, and half-and-half. Whole or full-fat cream cheese. Whole-fat or sweetened yogurt.  Full-fat cheese. Nondairy creamers. Whipped toppings. Processed cheese and cheese spreads. Fats and oils Butter. Stick margarine. Lard. Shortening. Ghee. Bacon fat. Tropical oils, such as coconut, palm kernel, or palm oil. Seasoning and other foods Salted popcorn and pretzels. Onion salt, garlic salt, seasoned salt, table salt, and sea salt. Worcestershire sauce. Tartar sauce. Barbecue sauce. Teriyaki sauce. Soy sauce, including reduced-sodium. Steak sauce. Canned and packaged gravies. Fish sauce. Oyster sauce. Cocktail sauce. Horseradish that you find on the shelf. Ketchup.  Mustard. Meat flavorings and tenderizers. Bouillon cubes. Hot sauce and Tabasco sauce. Premade or packaged marinades. Premade or packaged taco seasonings. Relishes. Regular salad dressings. Where to find more information:  National Heart, Lung, and [place]: https://wilson-eaton.com/  American Heart Association: www.heart.org Summary  The DASH eating plan is a healthy eating plan that has been shown to reduce high blood pressure (hypertension). It may also reduce your risk for type 2 diabetes, heart disease, and stroke.  With the DASH eating plan, you should limit salt (sodium) intake to 2,300 mg a day. If you have hypertension, you may need to reduce your sodium intake to 1,500 mg a day.  When on the DASH eating plan, aim to eat more fresh fruits and vegetables, whole grains, lean proteins, low-fat dairy, and heart-healthy fats.  Work with your health care provider or diet and nutrition specialist (dietitian) to adjust your eating plan to your individual calorie needs. This information is not intended to replace advice given to you by your health care provider. Make sure you discuss any questions you have with your health care provider. Document Revised: 10/30/2017 Document Reviewed: 11/10/2016 Elsevier Patient Education  2020 [employer].

## 2019-12-21 ENCOUNTER — Other Ambulatory Visit: Payer: Self-pay | Admitting: Physician Assistant

## 2019-12-21 DIAGNOSIS — R0602 Shortness of breath: Secondary | ICD-10-CM

## 2019-12-21 DIAGNOSIS — R0789 Other chest pain: Secondary | ICD-10-CM

## 2019-12-21 DIAGNOSIS — I351 Nonrheumatic aortic (valve) insufficiency: Secondary | ICD-10-CM

## 2019-12-23 ENCOUNTER — Other Ambulatory Visit: Payer: Self-pay | Admitting: *Deleted

## 2020-01-03 ENCOUNTER — Other Ambulatory Visit: Payer: Self-pay

## 2020-01-03 ENCOUNTER — Ambulatory Visit (HOSPITAL_COMMUNITY)
Admission: RE | Admit: 2020-01-03 | Discharge: 2020-01-03 | Disposition: A | Discharge status: Home / Self Care | Payer: Medicare HMO | Source: Ambulatory Visit | Attending: Adult Health Nurse Practitioner | Admitting: Adult Health Nurse Practitioner

## 2020-01-03 ENCOUNTER — Other Ambulatory Visit (HOSPITAL_COMMUNITY): Payer: Self-pay | Admitting: Adult Health Nurse Practitioner

## 2020-01-03 ENCOUNTER — Encounter (HOSPITAL_COMMUNITY): Payer: Self-pay

## 2020-01-03 DIAGNOSIS — R0602 Shortness of breath: Secondary | ICD-10-CM | POA: Diagnosis present

## 2020-01-06 ENCOUNTER — Other Ambulatory Visit: Payer: Self-pay

## 2020-01-06 MED ORDER — METOPROLOL TARTRATE 50 MG PO TABS: 50.0000 mg | ORAL_TABLET | Freq: Two times a day (BID) | ORAL | 2 refills | Status: DC

## 2020-01-06 NOTE — Telephone Encounter (Signed)
Refilled amiodarone

## 2020-01-11 ENCOUNTER — Other Ambulatory Visit: Payer: Self-pay

## 2020-01-11 ENCOUNTER — Ambulatory Visit (INDEPENDENT_AMBULATORY_CARE_PROVIDER_SITE_OTHER): Payer: Medicare HMO

## 2020-01-11 ENCOUNTER — Encounter: Payer: Self-pay | Admitting: *Deleted

## 2020-01-11 DIAGNOSIS — I351 Nonrheumatic aortic (valve) insufficiency: Secondary | ICD-10-CM

## 2020-01-11 DIAGNOSIS — R0789 Other chest pain: Secondary | ICD-10-CM

## 2020-01-11 DIAGNOSIS — R0602 Shortness of breath: Secondary | ICD-10-CM

## 2020-01-11 DIAGNOSIS — R0989 Other specified symptoms and signs involving the circulatory and respiratory systems: Secondary | ICD-10-CM

## 2020-01-16 ENCOUNTER — Telehealth: Payer: Self-pay | Admitting: Licensed Clinical Social Worker

## 2020-01-16 NOTE — Telephone Encounter (Signed)
CSW referred to assist patient with obtaining a scale and BP cuff. CSW contacted patient to inform scale and cuff will be delivered to home. Patient grateful for support and assistance. CSW available as needed. Jackie Elmond Poehlman, LCSW, CCSW-MCS [phone]  

## 2020-01-18 ENCOUNTER — Telehealth (INDEPENDENT_AMBULATORY_CARE_PROVIDER_SITE_OTHER): Payer: Medicare HMO | Admitting: Student

## 2020-01-18 ENCOUNTER — Encounter: Payer: Self-pay | Admitting: Student

## 2020-01-18 ENCOUNTER — Encounter: Payer: Self-pay | Admitting: *Deleted

## 2020-01-18 VITALS — Ht 71.0 in | Wt 249.0 lb

## 2020-01-18 DIAGNOSIS — I2510 Atherosclerotic heart disease of native coronary artery without angina pectoris: Secondary | ICD-10-CM

## 2020-01-18 DIAGNOSIS — N183 Chronic kidney disease, stage 3 unspecified: Secondary | ICD-10-CM

## 2020-01-18 DIAGNOSIS — I5042 Chronic combined systolic (congestive) and diastolic (congestive) heart failure: Secondary | ICD-10-CM | POA: Diagnosis not present

## 2020-01-18 DIAGNOSIS — E785 Hyperlipidemia, unspecified: Secondary | ICD-10-CM

## 2020-01-18 DIAGNOSIS — I38 Endocarditis, valve unspecified: Secondary | ICD-10-CM

## 2020-01-18 MED ORDER — ASPIRIN EC 81 MG PO TBEC: 81.0000 mg | DELAYED_RELEASE_TABLET | Freq: Every day | ORAL | Status: DC

## 2020-01-18 MED ORDER — FUROSEMIDE 20 MG PO TABS: ORAL_TABLET | ORAL | 3 refills | Status: DC

## 2020-01-18 NOTE — Patient Instructions (Signed)
Medication Instructions:  Your physician has recommended you make the following change in your medication:   Start Lasix 20 mg for 7 Days Then reduce to As Needed.  Reduce Aspirin to 81 mg Daily   Call to Report symptoms next week  *If you need a refill on your cardiac medications before your next appointment, please call your pharmacy*  Lab Work: NONE  If you have labs (blood work) drawn today and your tests are completely normal, you will receive your results only by: Marland Kitchen MyChart Message (if you have MyChart) OR . A paper copy in the mail If you have any lab test that is abnormal or we need to change your treatment, we will call you to review the results.  Testing/Procedures: NONE   Follow-Up: At [facility], you and your health needs are our priority.  As part of our continuing mission to provide you with exceptional heart care, we have created designated Provider Care Teams.  These Care Teams include your primary Cardiologist (physician) and Advanced Practice Providers (APPs -  Physician Assistants and Nurse Practitioners) who all work together to provide you with the care you need, when you need it.  Your next appointment:   4-5  week(s)  The format for your next appointment:   In Person  Provider:   You may see Rozann Lesches, MD or one of the following Advanced Practice Providers on your designated Care Team:    Bernerd Pho, PA-C   Ermalinda Barrios, PA-C    Other Instructions Thank you for choosing [facility]!

## 2020-01-18 NOTE — Progress Notes (Signed)
Virtual Visit via Telephone Note   This visit type was conducted due to national recommendations for restrictions regarding the COVID-19 Pandemic (e.g. social distancing) in an effort to limit this patient's exposure and mitigate transmission in our community.  Due to his co-morbid illnesses, this patient is at least at moderate risk for complications without adequate follow up.  This format is felt to be most appropriate for this patient at this time.  The patient did not have access to video technology/had technical difficulties with video requiring transitioning to audio format only (telephone).  All issues noted in this document were discussed and addressed.  No physical exam could be performed with this format.  Please refer to the patient's chart for his  consent to telehealth for [facility].   Date:  01/18/2020   ID:  Alejandro [name], DOB October 12, 1941, MRN [id number]  Patient Location: Home Provider Location: Office  PCP:  Celene Squibb, MD  Cardiologist:  Rozann Lesches, MD  Electrophysiologist:  None   Evaluation Performed:  Follow-Up Visit  Chief Complaint:  1 month visit  History of Present Illness:    Alejandro [name] is a 79 y.o. male with past medical history of CAD (s/p CABG in 05/2018), chronic diastolic CHF, HTN, HLD, AS, Lyme Disease, and Stage 3 CKD who presents to the office today for 31-month follow-up.  He was examined by Melina Copa, PA-C on 12/20/2019 and reported some indiscretion with his diet and been consuming more salt. He did report intermittent episodes of chest pain which felt different from his prior angina and symptoms had improved with activity. He did have an aortic murmur on examination which was more prominent than previous, therefore a follow-up echocardiogram along with abdominal aortic duplex were recommended for further evaluation. He was supposed to have repeat labs but these have not yet been obtained.  Echocardiogram did show his EF was slightly  reduced at 45% with global hypokinesis. He did have moderate mitral valve regurgitation and mild aortic valve stenosis. Aortic duplex showed no evidence of an aneurysm.   In talking the patient today, he reports having dyspnea on exertion for the past 2 weeks. He was evaluated by his PCP and diagnosed with congestion and says he had been on antibiotics but did not experience improvement in his symptoms. By review of records, he had a CXR on 01/03/2020 which showed mild interstitial edema. He was not started on a diuretic per his report. He did have lab work at that time but this has not been sent to the office for review.  He denies any associated chest pain or palpitations. No recent orthopnea, PND, lower extremity edema or abdominal distention. No fever or chills. He says his weight has increased over the past few months but is unsure of the exact amount. He does not weigh himself daily and scales have been ordered through social work.  He does eat out and consume fast food regularly since his wife passed away.   Past Medical History:  Diagnosis Date   Aortic insufficiency    a. mild-mod by intraop TEE 05/2018 (no aortic stenosis).   CAD (coronary artery disease)    a. NSTEMI 05/2018 with multivessel disease -> s/p CABGx4 05/11/18.   Chronic diastolic CHF (congestive heart failure) ([place])    a. dx 05/2018.   CKD (chronic kidney disease), stage III    History of nephrolithiasis 2004   Hyperlipidemia    Hypertension    IBS (irritable colon syndrome)  Postoperative atrial fibrillation (HCC)    PVC's (premature ventricular contractions)    Reflux    Type 2 diabetes mellitus ([place])    Past Surgical History:  Procedure Laterality Date   CORONARY ARTERY BYPASS GRAFT N/A 05/11/2018   Procedure: CORONARY ARTERY BYPASS GRAFTING (CABG), on pump, times four, using left internal mammary artery and endoscopically harvested right greater saphenous leg vein.;  Surgeon: Ivin Poot, MD;   Location: [place];  Service: Open Heart Surgery;  Laterality: N/A;   ESOPHAGOGASTRODUODENOSCOPY  02/04/2012   Procedure: ESOPHAGOGASTRODUODENOSCOPY (EGD);  Surgeon: Rogene Houston, MD;  [facility];  Service: Endoscopy;  Laterality: N/A;  100   LEFT HEART CATH AND CORONARY ANGIOGRAPHY N/A 05/04/2018   Procedure: LEFT HEART CATH AND CORONARY ANGIOGRAPHY;  Surgeon: Belva Crome, MD;  Location: [place] [facility];  Service: Cardiovascular;  Laterality: N/A;   NASAL FRACTURE SURGERY     TEE WITHOUT CARDIOVERSION N/A 05/11/2018   Procedure: TRANSESOPHAGEAL ECHOCARDIOGRAM (TEE);  Surgeon: Prescott Gum, Collier Salina, MD;  Location: [place];  Service: Open Heart Surgery;  Laterality: N/A;     Current Meds  Medication Sig   ALPRAZolam (XANAX) 0.5 MG tablet Take 0.5 mg by mouth at bedtime as needed for sleep.   atorvastatin (LIPITOR) 80 MG tablet TAKE 1 TABLET BY MOUTH EVERY DAY AT 6 PM   DEXILANT 60 MG capsule TAKE ONE CAPSULE BY MOUTH EVERY DAY.   metoprolol tartrate (LOPRESSOR) 50 MG tablet Take 1 tablet (50 mg total) by mouth 2 (two) times daily.   sitaGLIPtan-metformin (JANUMET) 50-1000 MG per tablet Take 1 tablet by mouth 2 (two) times daily with a meal.     [DISCONTINUED] aspirin 325 MG EC tablet Take 1 tablet (325 mg total) by mouth daily.     Allergies:   Patient has no known allergies.   Social History   Tobacco Use   Smoking status: Never Smoker   Smokeless tobacco: Never Used  Substance Use Topics   Alcohol use: No    Alcohol/week: 0.0 standard drinks   Drug use: No     Family Hx: The patient's family history includes Arthritis in an other family member; Asthma in an other family member; Diabetes in an other family member; Diabetes Mellitus II in his mother; Heart attack in his brother and father; Heart disease in an other family member.  ROS:   Please see the history of present illness.     All other systems reviewed and are negative.   Prior CV studies:   The  following studies were reviewed today:  Cardiac Catheterization: 05/2018  Technically difficult procedure to complete due to subclavian and innominate artery tortuosity.  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  The vessel contains diffuse luminal irregularities from the proximal to the distal third of the vessel.  No focal high-grade obstruction is noted.  Need to exclude course between pulmonary artery and aorta as well as slitlike orifice/intramural course.  First diagonal, which arises from the left main, contains calcified segmental 95% stenosis beyond the septal perforator.  There is also a large septal perforator that arises from the diagonal.  Circumflex coronary artery gives origin to a trifurcating obtuse marginal #1 with the first branch of the trifurcation containing 95 to 99% stenosis.  This branch is small to moderate in size.  Continuation of the circumflex beyond the first obtuse marginal contains 70%  stenosis before ending on a very tiny second obtuse marginal branch.  The left main is widely patent.  The right coronary artery is dominant and contains 50 to 60% distal stenosis, PDA contains 75% ostial stenosis, first LV branch contains segmental 80% stenosis, and large second left ventricular branch contains segmental 60 to 70% proximal narrowing.  Overall LV function could not be assessed by the study.  EDP is markedly elevated at 26 mmHg suggesting an acute on chronic diastolic heart failure process given echo EF of 55%.  RECOMMENDATIONS:   Coronary CT angio to evaluate the course and ostium of the LAD.  If there are high risk features, should consider surgical consult and heart team approach concerning revascularization options. The AAOCA-LAD supplies the mid and distal third of the anterior wall.  If LAD is not potentially ischemia inducing, diagonal could be intervened upon.   The branch of the obtuse marginal would be more difficult.  For the time being, medical management of heart failure, monitor kidney function, risk factor modification, and and decision concerning revascularization after CT.  Echocardiogram: 01/2020 IMPRESSIONS    1. Left ventricular ejection fraction, by estimation, is 45%. The left  ventricle has mildly decreased function. The left ventrical demonstrates  global hypokinesis, more prominent in the basal inferior wall. The left  ventricular internal cavity size was  moderately dilated. There is mildly increased left ventricular  hypertrophy. Left ventricular diastolic parameters are consistent with  Grade III diastolic dysfunction (restrictive).  2. Right ventricular systolic function is low normal. The right  ventricular size is normal. There is moderately elevated pulmonary artery  systolic pressure. The estimated right ventricular systolic pressure is  26.3 mmHg.  3. Left atrial size was severely dilated.  4. Right atrial size was mildly dilated.  5. The mitral valve is grossly normal. Moderate mitral valve  regurgitation.  6. The aortic valve is abnormal, appears to be functionally bicuspid.  Aortic valve regurgitation is mild. Mild aortic valve stenosis. Aortic  valve area, by VTI measures 1.40 cm. Aortic valve mean gradient measures  18.7 mmHg.  7. Aortic dilatation noted. There is mild dilatation of the aortic root  measuring 43 mm.  8. The inferior vena cava is normal in size with greater than 50%  respiratory variability, suggesting right atrial pressure of 3 mmHg.    AAA: 01/2020 Summary:  Abdominal Aorta: No evidence of an abdominal aortic aneurysm was  visualized. The largest aortic measurement is 2.4 cm.  IVC/Iliac: IVC is patent.   Labs/Other Tests and Data Reviewed:    EKG:  No ECG reviewed.  Recent Labs: No results found for requested labs within last 8760 hours.   Recent Lipid Panel No results  found for: CHOL, TRIG, HDL, CHOLHDL, LDLCALC, LDLDIRECT  Wt Readings from Last 3 Encounters:  01/18/20 249 lb (112.9 kg)  12/20/19 254 lb (115.2 kg)  11/10/18 236 lb (107 kg)     Objective:    Vital Signs:  Ht 5\' 11"  (1.803 m)    Wt 249 lb (112.9 kg)    BMI 34.73 kg/m    General: Pleasant male sounding in NAD Psych: Normal affect. Neuro: Alert and oriented X 3. Lungs:  Resp regular and unlabored while talking on the phone.    ASSESSMENT & PLAN:    1. Chronic Combined Systolic and Diastolic CHF - EF was previously 50-55% by prior echocardiogram, at 45% by most recent study as outlined above.  - given his weight gain, dyspnea on exertion, and  recent CXR showing interstitial edema, I suspect fluid accumulation is playing a role in his symptoms. I recommended he start Lasix 20mg  daily for 7 days and call back to report on symptoms next week. If needing to remain on this, would obtain a repeat BMET. He did have labs by his PCP last week and I am requesting a copy of these records as a CBC, BMET and BNP were to be obtained.  - we did review the importance of reducing his sodium intake. Remains on Lopressor 50mg  BID.    2. CAD - he is s/p CABG in 05/2018. He has experienced dyspnea as outlined above but denies any chest pain. If symptoms do not improve with diuresis, would consider repeat ischemic evaluation.  - continue ASA (reduce dosing to 81mg  daily), BB and statin therapy.   3. HLD - followed by PCP. Will request most recent labs. Goal LDL is less than 70 with known CAD. He remains on Atorvastatin 80mg  daily.   4. Valvular Heart Disease - most recent echocardiogram showed moderate MR and mild AS. Will continue to follow.   5. Stage 3 CKD - creatinine stable at 1.07 in 11/2019. Will request a copy of recent labs.    COVID-19 Education: The signs and symptoms of COVID-19 were discussed with the patient and how to seek care for testing (follow up with PCP or arrange E-visit).  The  importance of social distancing was discussed today.  Time:   Today, I have spent 19 minutes with the patient with telehealth technology discussing the above problems.     Medication Adjustments/Labs and Tests Ordered: Current medicines are reviewed at length with the patient today.  Concerns regarding medicines are outlined above.   Tests Ordered: No orders of the defined types were placed in this encounter.   Medication Changes: Meds ordered this encounter  Medications   furosemide (LASIX) 20 MG tablet    Sig: Take 20mg  (1 tablet) once daily for 7 days then as needed for edema or weight gain.    Dispense:  30 tablet    Refill:  3    Order Specific Question:   Supervising Provider    Answer:   Dorothy Spark [[id number]]   aspirin EC 81 MG tablet    Sig: Take 1 tablet (81 mg total) by mouth daily.    Dispense:       Order Specific Question:   Supervising Provider    Answer:   Dorothy Spark [[id number]]    Follow Up:  In Person in 4 week(s)  Signed, Erma Heritage, PA-C  01/18/2020 7:59 PM    [facility]

## 2020-01-20 ENCOUNTER — Telehealth: Payer: Self-pay

## 2020-01-20 MED ORDER — FUROSEMIDE 20 MG PO TABS: ORAL_TABLET | ORAL | 3 refills | Status: DC

## 2020-01-20 NOTE — Telephone Encounter (Signed)
01-20-20 Pt states he is waiting for a prescription to be called in. Please call  [phone]   Thanks renee

## 2020-01-20 NOTE — Telephone Encounter (Signed)
Per [facility], pt picked up #90 lasix 20 mg on 01/04/20.Pt will look in his house for meds

## 2020-01-23 ENCOUNTER — Telehealth: Payer: Self-pay | Admitting: Student

## 2020-01-23 DIAGNOSIS — Z79899 Other long term (current) drug therapy: Secondary | ICD-10-CM

## 2020-01-23 NOTE — Telephone Encounter (Signed)
     Please let the patient know I reviewed labs from his PCP. He does have a mild anemia with hemoglobin at 11.6 and this was 12.6 in 11/2019. Platelet count within a normal range.  Sodium and potassium were stable with creatinine at 0.98. D-dimer was negative making a blood clot less likely. He did have an elevated BNP of 516 which is consistent with his recent CXR showing fluid accumulation.  We started Lasix at the time of his visit last week. Please follow-up to see if he is feeling any better with this and if symptoms have improved?  Signed, Erma Heritage, PA-C 01/23/2020, 1:26 PM Pager: [phone]

## 2020-01-23 NOTE — Telephone Encounter (Signed)
Pt reports that he is " Not up to par". But he is feeling better. Pt states that the new medicine is making him sleepy.

## 2020-01-23 NOTE — Telephone Encounter (Signed)
    It would be odd for Lasix to cause sleepiness unless he is taking the medication in the evening hours which is keeping him up due to increased urination. Please make sure he is taking this in the AM and following daily weights. Would continue at current dosing for now and obtain a repeat BMET and BNP later this week.   Signed, Erma Heritage, PA-C 01/23/2020, 4:52 PM Pager: [phone]

## 2020-01-24 NOTE — Addendum Note (Signed)
Addended by: Levonne Hubert on: 01/24/2020 05:14 PM   Modules accepted: Orders

## 2020-01-24 NOTE — Telephone Encounter (Signed)
Patient notified of Alejandro [name]'s notes. He states that he takes his lasix in the morning. Pt notified of the need to have lab work done.

## 2020-01-27 ENCOUNTER — Other Ambulatory Visit: Payer: Self-pay

## 2020-01-27 ENCOUNTER — Other Ambulatory Visit (HOSPITAL_COMMUNITY)
Admission: RE | Admit: 2020-01-27 | Discharge: 2020-01-27 | Disposition: A | Discharge status: Home / Self Care | Payer: Medicare HMO | Source: Ambulatory Visit | Attending: Student | Admitting: Student

## 2020-01-27 DIAGNOSIS — Z79899 Other long term (current) drug therapy: Secondary | ICD-10-CM | POA: Diagnosis present

## 2020-01-27 LAB — BASIC METABOLIC PANEL
Anion gap: 8 (ref 5–15)
BUN: 23 mg/dL (ref 8–23)
CO2: 23 mmol/L (ref 22–32)
Calcium: 8.6 mg/dL — ABNORMAL LOW (ref 8.9–10.3)
Chloride: 104 mmol/L (ref 98–111)
Creatinine, Ser: 1.21 mg/dL (ref 0.61–1.24)
GFR calc Af Amer: >60 mL/min (ref >60)
GFR calc non Af Amer: 58 mL/min — ABNORMAL LOW (ref >60)
Glucose, Bld: 154 mg/dL — ABNORMAL HIGH (ref 70–99)
Potassium: 4.4 mmol/L (ref 3.5–5.1)
Sodium: 135 mmol/L (ref 135–145)

## 2020-01-27 LAB — BRAIN NATRIURETIC PEPTIDE: B Natriuretic Peptide: 327 pg/mL — ABNORMAL HIGH (ref 0.0–100.0)

## 2020-02-20 IMAGING — DX DG CHEST 1V PORT
1 series · 1 of 1 positions shown · non-contrast
Comparison: 05/08/2018

CLINICAL DATA: Postop for CABG.

EXAM:
PORTABLE CHEST 1 VIEW

[chest]
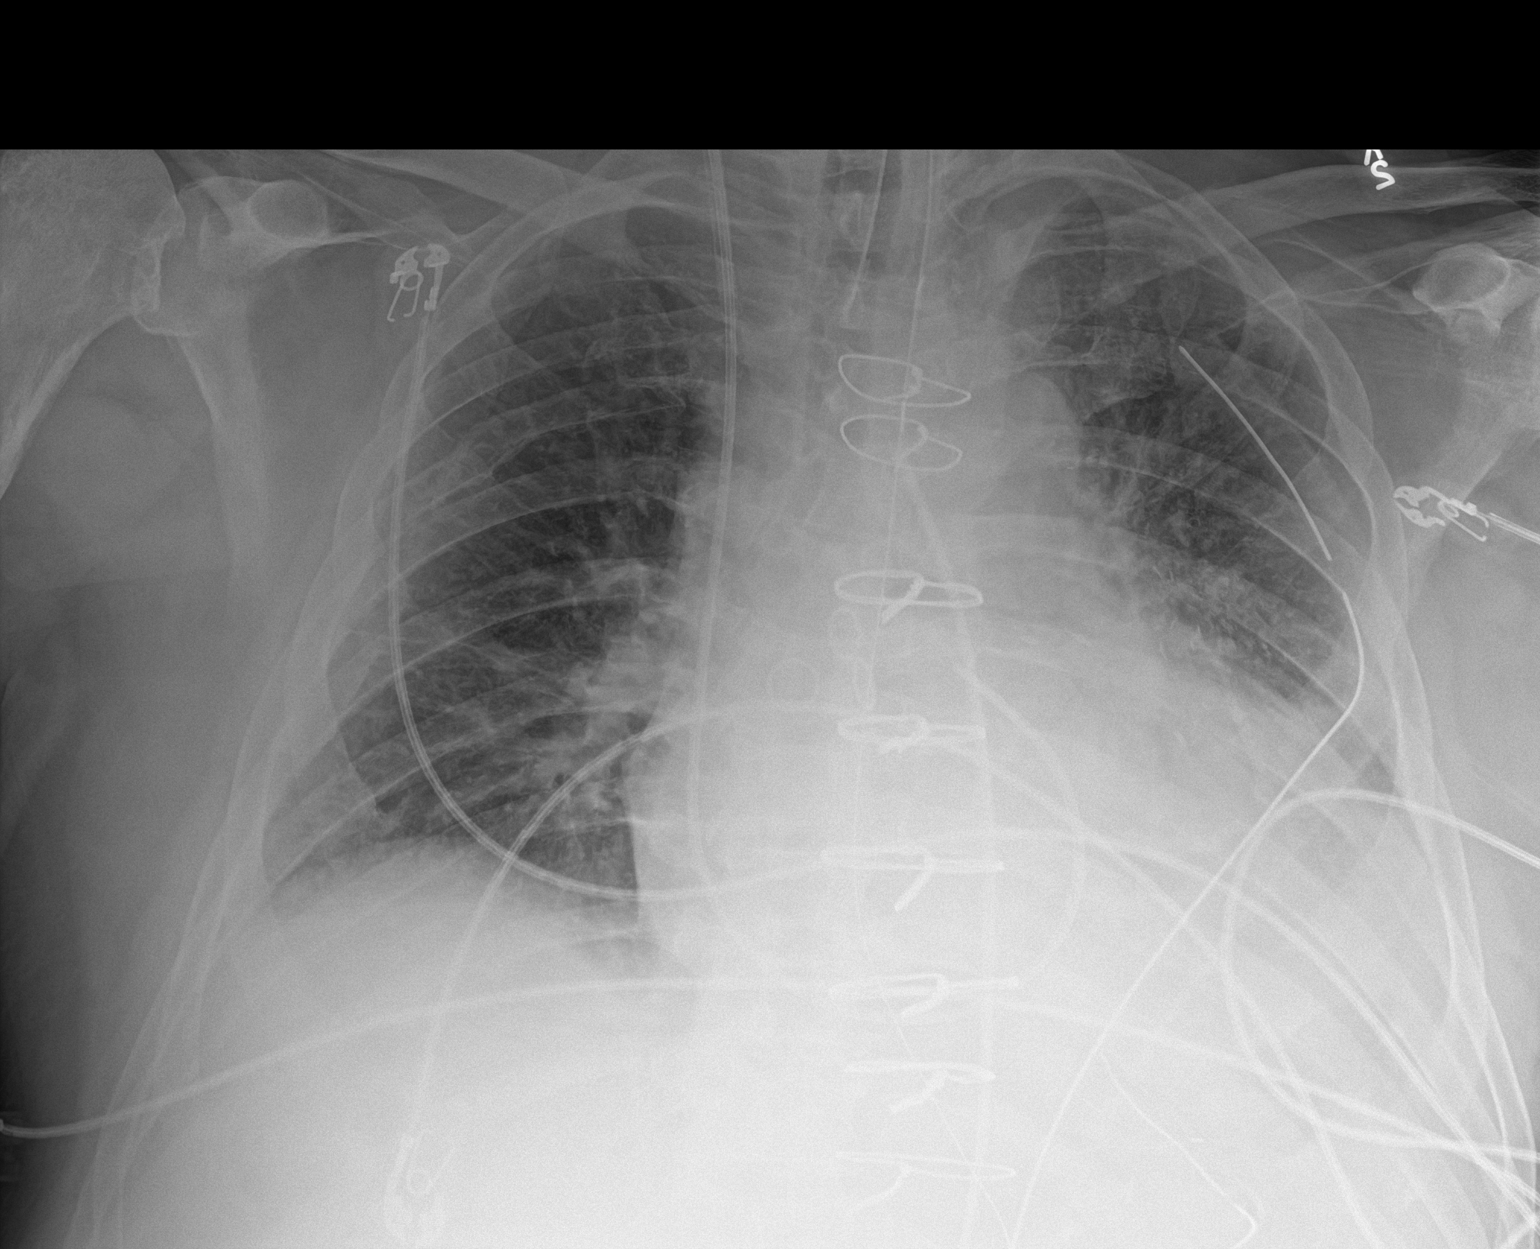

[1 of 1 positions shown; findings below may reference images not displayed]

FINDINGS: Interval median sternotomy. Right IJ Swan-Ganz catheter tip at
pulmonary outflow tract. Endotracheal tube terminates 5.7 cm cm
above carina.Nasogastric terminates at the body of the stomach. Left
chest tubes.

Cardiomegaly accentuated by AP portable technique. Probable small
left pleural effusion. No pneumothorax. Low lung volumes,
accentuating pulmonary interstitium. Mild left base atelectasis. At
least 1 remote right rib fracture.
IMPRESSION: Expected appearance after median sternotomy for CABG.

Low lung volumes with trace left pleural fluid and left base
atelectasis.

## 2020-02-21 IMAGING — DX DG CHEST 1V PORT
1 series · 1 of 1 positions shown · non-contrast
Comparison: 05/11/2018

CLINICAL DATA: Post CABG

EXAM:
PORTABLE CHEST 1 VIEW

[chest]
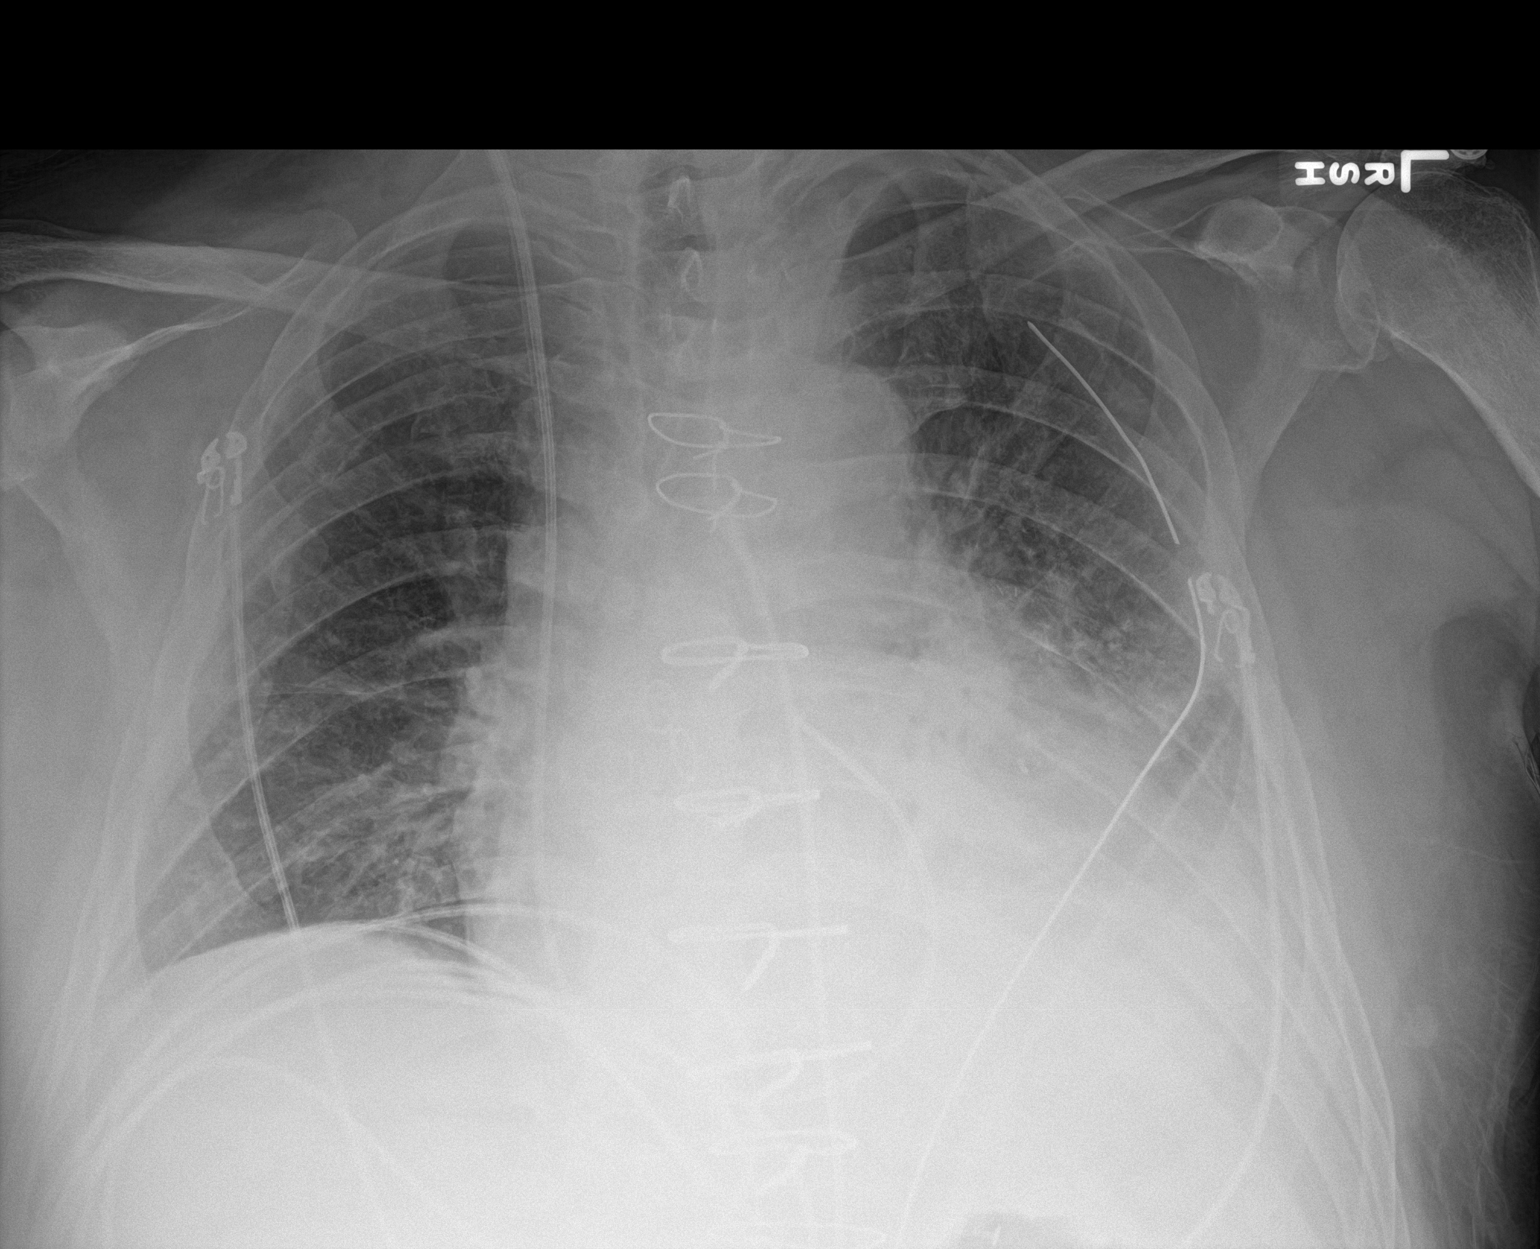

[1 of 1 positions shown; findings below may reference images not displayed]

FINDINGS: Prior CABG. Interval removal of endotracheal tube and NG tube.
Swan-Ganz catheter remains in the pulmonary outflow tract. Left
chest tube in place without pneumothorax. Cardiomegaly with vascular
congestion. Left base atelectasis or infiltrate with small left
effusion.
IMPRESSION: Interval extubation. Support devices are otherwise unchanged. No
pneumothorax.

Left lower lobe atelectasis or infiltrate.

Cardiomegaly, vascular congestion.

## 2020-02-23 IMAGING — DX DG CHEST 2V
2 series · 2 of 2 positions shown · non-contrast
Comparison: 05/13/2018.

CLINICAL DATA: Prior CABG.

EXAM:
CHEST - 2 VIEW

[chest pa]
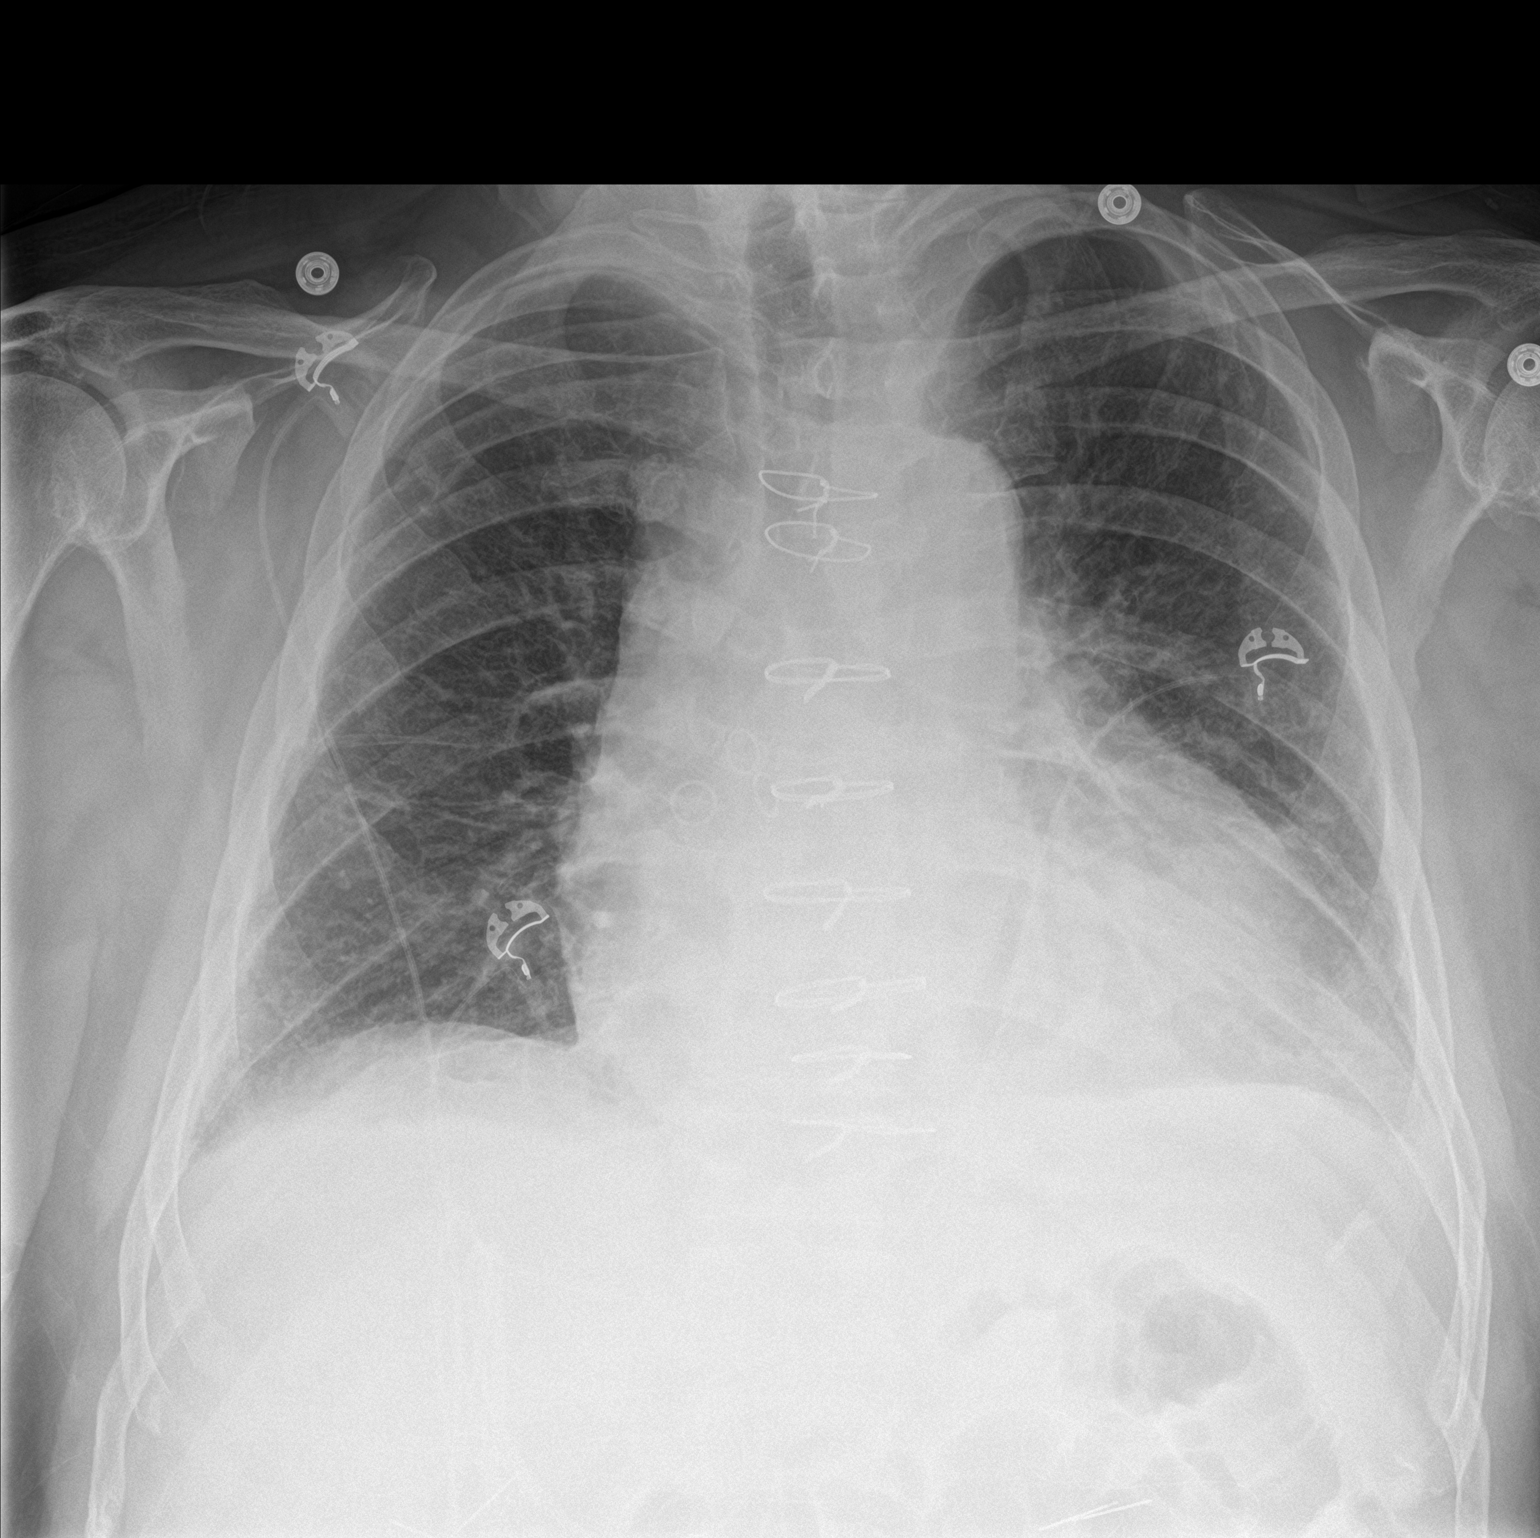

[chest lat]
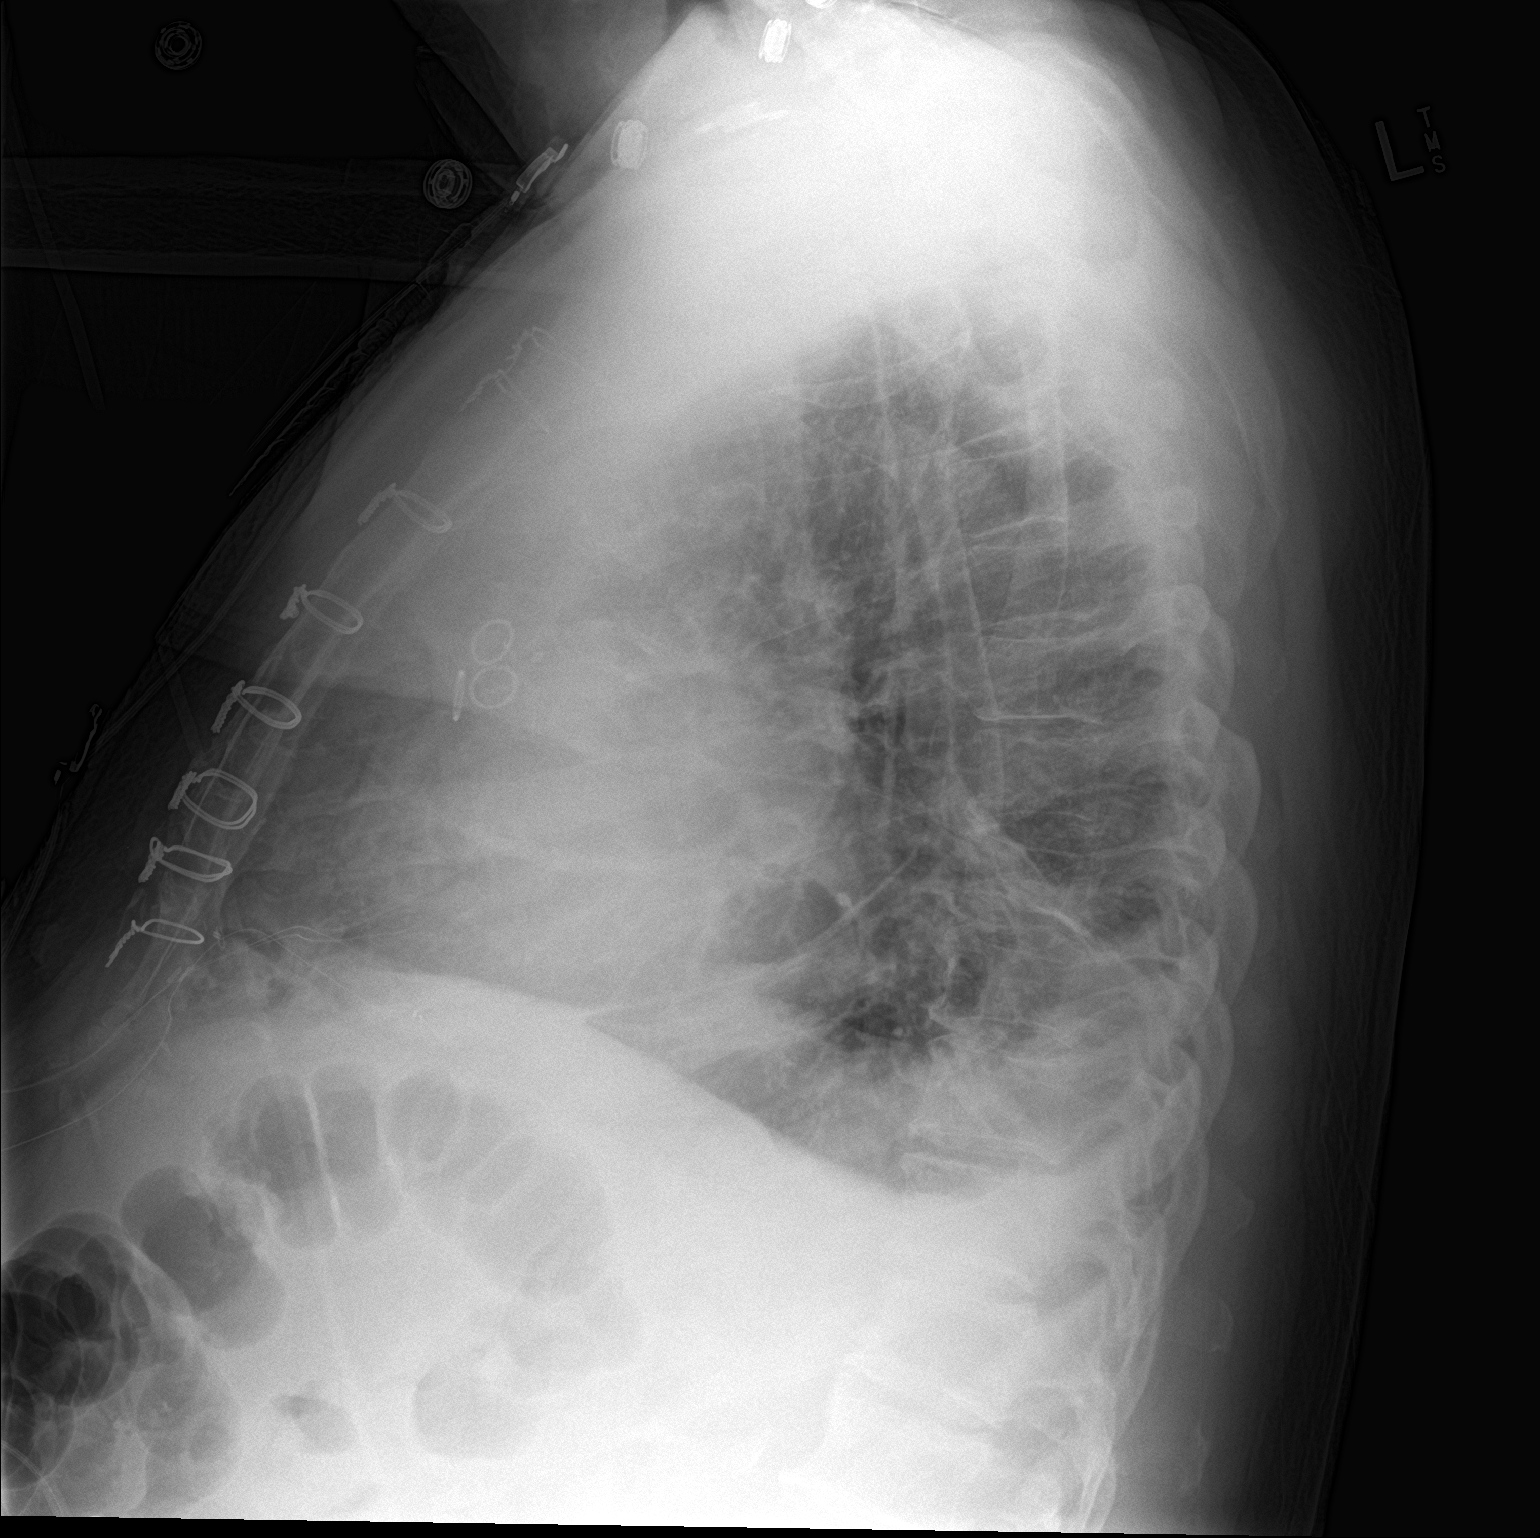

[2 of 2 positions shown; findings below may reference images not displayed]

FINDINGS: Prior CABG. Cardiomegaly. Normal pulmonary vascularity. Mild
atelectatic changes both lung bases. Small bilateral pleural
effusions. No pneumothorax. Old right fifth rib fracture.
IMPRESSION: 1.  Prior CABG.  Cardiomegaly.

2. Mild atelectatic changes both lung bases. Small bilateral pleural
effusions.

## 2020-02-24 ENCOUNTER — Ambulatory Visit (INDEPENDENT_AMBULATORY_CARE_PROVIDER_SITE_OTHER): Payer: Medicare HMO | Admitting: Cardiology

## 2020-02-24 ENCOUNTER — Other Ambulatory Visit: Payer: Self-pay

## 2020-02-24 ENCOUNTER — Encounter: Payer: Self-pay | Admitting: Cardiology

## 2020-02-24 VITALS — BP 138/80 | HR 60 | Ht 71.0 in | Wt 249.2 lb

## 2020-02-24 DIAGNOSIS — I25119 Atherosclerotic heart disease of native coronary artery with unspecified angina pectoris: Secondary | ICD-10-CM

## 2020-02-24 DIAGNOSIS — N1832 Chronic kidney disease, stage 3b: Secondary | ICD-10-CM

## 2020-02-24 DIAGNOSIS — I5032 Chronic diastolic (congestive) heart failure: Secondary | ICD-10-CM | POA: Diagnosis not present

## 2020-02-24 NOTE — Progress Notes (Signed)
Cardiology Office Note  Date: 02/24/2020   ID: Alejandro [name], Alejandro [name] 1941/10/25, MRN [id number]  PCP:  Celene Squibb, MD  Cardiologist:  Rozann Lesches, MD Electrophysiologist:  None   Chief Complaint  Patient presents with  . Cardiac follow-up    History of Present Illness: Alejandro [name] is a 79 y.o. male last assessed via telehealth encounter in February by Ms. Strader PA-C.  He presents for a follow-up visit.  He tells me that he feels better, breathlessness has resolved, no chest congestion or cough, also no worsening leg swelling.  He has been tracking his weight at home, not taking Lasix on a regular basis at this time.  Patient was placed on low-dose Lasix at last encounter, lab work obtained as well.  Recent echocardiogram is reviewed below.  I reviewed his cardiac regimen which is outlined below.  Past Medical History:  Diagnosis Date  . Aortic insufficiency    a. mild-mod by intraop TEE 05/2018 (no aortic stenosis).  Marland Kitchen CAD (coronary artery disease)    a. NSTEMI 05/2018 with multivessel disease -> s/p CABGx4 05/11/18.  Marland Kitchen Chronic diastolic CHF (congestive heart failure) ([place])    a. dx 05/2018.  . CKD (chronic kidney disease), stage III   . History of nephrolithiasis 2004  . Hyperlipidemia   . Hypertension   . IBS (irritable colon syndrome)   . Postoperative atrial fibrillation ([place])   . PVC's (premature ventricular contractions)   . Reflux   . Type 2 diabetes mellitus ([place])     Past Surgical History:  Procedure Laterality Date  . CORONARY ARTERY BYPASS GRAFT N/A 05/11/2018   Procedure: CORONARY ARTERY BYPASS GRAFTING (CABG), on pump, times four, using left internal mammary artery and endoscopically harvested right greater saphenous leg vein.;  Surgeon: Ivin Poot, MD;  Location: [facility];  Service: Open Heart Surgery;  Laterality: N/A;  . ESOPHAGOGASTRODUODENOSCOPY  02/04/2012   Procedure: ESOPHAGOGASTRODUODENOSCOPY (EGD);  Surgeon: Rogene Houston, MD;  [facility];  Service: Endoscopy;  Laterality: N/A;  100  . LEFT HEART CATH AND CORONARY ANGIOGRAPHY N/A 05/04/2018   Procedure: LEFT HEART CATH AND CORONARY ANGIOGRAPHY;  Surgeon: Belva Crome, MD;  Location: [facility];  Service: Cardiovascular;  Laterality: N/A;  . NASAL FRACTURE SURGERY    . TEE WITHOUT CARDIOVERSION N/A 05/11/2018   Procedure: TRANSESOPHAGEAL ECHOCARDIOGRAM (TEE);  Surgeon: Prescott Gum, Collier Salina, MD;  Location: [facility];  Service: Open Heart Surgery;  Laterality: N/A;    Current Outpatient Medications  Medication Sig Dispense Refill  . ALPRAZolam (XANAX) 0.5 MG tablet Take 0.5 mg by mouth at bedtime as needed for sleep.    Marland Kitchen aspirin EC 81 MG tablet Take 1 tablet (81 mg total) by mouth daily.    Marland Kitchen atorvastatin (LIPITOR) 80 MG tablet TAKE 1 TABLET BY MOUTH EVERY DAY AT 6 PM 30 tablet 6  . furosemide (LASIX) 20 MG tablet Take 20mg  (1 tablet) once daily for 7 days then as needed for edema or weight gain. 30 tablet 3  . hydrALAZINE (APRESOLINE) 25 MG tablet Take 1 tablet (25 mg total) by mouth every 8 (eight) hours. 90 tablet 1  . metoprolol tartrate (LOPRESSOR) 50 MG tablet Take 1 tablet (50 mg total) by mouth 2 (two) times daily. 180 tablet 2  . pantoprazole (PROTONIX) 40 MG tablet Take 1 tablet by mouth daily.    . sitaGLIPtan-metformin (JANUMET) 50-1000 MG per tablet Take 1 tablet by mouth 2 (two)  times daily with a meal.       No current facility-administered medications for this visit.   Allergies:  Patient has no known allergies.   ROS:   No orthopnea or PND.  Physical Exam: VS:  BP 138/80   Pulse 60   Ht 5\' 11"  (1.803 m)   Wt 249 lb 3.2 oz (113 kg)   SpO2 97%   BMI 34.76 kg/m , BMI Body mass index is 34.76 kg/m.  Wt Readings from Last 3 Encounters:  02/24/20 249 lb 3.2 oz (113 kg)  01/18/20 249 lb (112.9 kg)  12/20/19 254 lb (115.2 kg)    General: Obese male, appears comfortable at rest. HEENT: Conjunctiva and lids normal, wearing a mask. Neck:  Supple, no elevated JVP or carotid bruits, no thyromegaly. Lungs: Clear to auscultation, nonlabored breathing at rest. Cardiac: Regular rate and rhythm, no S3, 5-7/3 systolic murmur. Abdomen: Soft, nontender, bowel sounds present. Extremities: Trace lower leg edema, distal pulses 2+.  ECG:  An ECG dated 12/20/2019 was personally reviewed today and demonstrated:  Sinus rhythm with prolonged PR interval, nonspecific ST-T changes.  Recent Labwork: 01/27/2020: B Natriuretic Peptide 327.0; BUN 23; Creatinine, Ser 1.21; Potassium 4.4; Sodium 135   Other Studies Reviewed Today:  Echocardiogram 01/11/2020: 1. Left ventricular ejection fraction, by estimation, is 45%. The left  ventricle has mildly decreased function. The left ventrical demonstrates  global hypokinesis, more prominent in the basal inferior wall. The left  ventricular internal cavity size was  moderately dilated. There is mildly increased left ventricular  hypertrophy. Left ventricular diastolic parameters are consistent with  Grade III diastolic dysfunction (restrictive).  2. Right ventricular systolic function is low normal. The right  ventricular size is normal. There is moderately elevated pulmonary artery  systolic pressure. The estimated right ventricular systolic pressure is  22.0 mmHg.  3. Left atrial size was severely dilated.  4. Right atrial size was mildly dilated.  5. The mitral valve is grossly normal. Moderate mitral valve  regurgitation.  6. The aortic valve is abnormal, appears to be functionally bicuspid.  Aortic valve regurgitation is mild. Mild aortic valve stenosis. Aortic  valve area, by VTI measures 1.40 cm. Aortic valve mean gradient measures  18.7 mmHg.  7. Aortic dilatation noted. There is mild dilatation of the aortic root  measuring 43 mm.  8. The inferior vena cava is normal in size with greater than 50%  respiratory variability, suggesting right atrial pressure of 3 mmHg.   Abdominal  ultrasound 01/11/2020: Summary:  Abdominal Aorta: No evidence of an abdominal aortic aneurysm was  visualized. The largest aortic measurement is 2.4 cm.  IVC/Iliac: IVC is patent.   Assessment and Plan:  1.  Chronic combined heart failure, LVEF approximately 45%, low normal RV contraction and moderately elevated pulmonary artery pressures.  He states that his breathing has improved, weight has been stable.  Continue to use Lasix as needed for weight gain of 2 to 3 pounds in 24 hours or 5 pounds in 1 week.  Otherwise continue Lopressor and hydralazine.  2.  CKD stage IIIb, follow-up creatinine 1.21 and normal potassium.  Depending on how he does clinically, we still might be able to further modify his cardiomyopathy regimen, not necessarily a contraindication to consider ARB or Aldactone at low dose presuming his renal function remained stable.  3.  Valvular heart disease, moderate mitral regurgitation and mild aortic stenosis.  4.  Multivessel CAD status post CABG in 2019.  No active angina symptoms at  this time.  Continue aspirin and statin.  Medication Adjustments/Labs and Tests Ordered: Current medicines are reviewed at length with the patient today.  Concerns regarding medicines are outlined above.   Tests Ordered: No orders of the defined types were placed in this encounter.   Medication Changes: No orders of the defined types were placed in this encounter.   Disposition:  Follow up 6 months in the [place] office.  Signed, Satira Sark, MD, [facility] 02/24/2020 11:36 AM    [facility]. [address] [place], [place] [address] Phone: [phone]; Fax: [phone]

## 2020-02-24 NOTE — Patient Instructions (Addendum)

## 2020-06-05 ENCOUNTER — Telehealth: Payer: Self-pay | Admitting: Cardiology

## 2020-06-05 DIAGNOSIS — I2510 Atherosclerotic heart disease of native coronary artery without angina pectoris: Secondary | ICD-10-CM

## 2020-06-05 NOTE — Telephone Encounter (Signed)
Lexiscan scheduled for 7/8 at 11:45 am, arrive at 10 am   Pt instructed to not eat or drink 6 hours before test.May take meds with a sip of water,will hold nicotine and caffeine 8 hours before.He will hold his Janumet that morning.

## 2020-06-05 NOTE — Addendum Note (Signed)
Addended by: Barbarann Ehlers A on: 06/05/2020 11:06 AM   Modules accepted: Orders

## 2020-06-05 NOTE — Telephone Encounter (Signed)
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  We last saw him in March, he has been clinically stable from the perspective of ischemic heart disease status post CABG in 2019 and with ischemic cardiomyopathy and LVEF 45% by echocardiogram in February.  He has no history of unexplained syncope and does not have an ICD.  He has not had any formal ischemic testing since 2019.  Please schedule a [employer], as long as this is low risk I would not anticipate any specific cardiac restrictions and he can continue with DOT evaluation by his PCP.  If he needs a copy of his most recent echocardiogram that can be provided as well.

## 2020-06-07 ENCOUNTER — Encounter (HOSPITAL_COMMUNITY): Payer: Self-pay

## 2020-06-07 ENCOUNTER — Encounter (HOSPITAL_BASED_OUTPATIENT_CLINIC_OR_DEPARTMENT_OTHER)
Admission: RE | Admit: 2020-06-07 | Discharge: 2020-06-07 | Disposition: A | Discharge status: Home / Self Care | Payer: Medicare HMO | Source: Ambulatory Visit | Attending: Cardiology | Admitting: Cardiology

## 2020-06-07 ENCOUNTER — Other Ambulatory Visit: Payer: Self-pay

## 2020-06-07 ENCOUNTER — Ambulatory Visit (HOSPITAL_COMMUNITY)
Admission: RE | Admit: 2020-06-07 | Discharge: 2020-06-07 | Disposition: A | Discharge status: Home / Self Care | Payer: Medicare HMO | Source: Ambulatory Visit | Attending: Cardiology | Admitting: Cardiology

## 2020-06-07 DIAGNOSIS — I2510 Atherosclerotic heart disease of native coronary artery without angina pectoris: Secondary | ICD-10-CM

## 2020-06-07 LAB — NM MYOCAR MULTI W/SPECT W/WALL MOTION / EF
LV dias vol: 192 mL (ref 62–150)
LV sys vol: 121 mL
Peak HR: 73 {beats}/min
RATE: 0.44
Rest HR: 58 {beats}/min
SDS: 3
SRS: 0
SSS: 3
TID: 1.05

## 2020-06-07 MED ORDER — REGADENOSON 0.4 MG/5ML IV SOLN
INTRAVENOUS | Status: AC
  Administered 2020-06-07: 0.4 mg via INTRAVENOUS
  Filled 2020-06-07: qty 5

## 2020-06-07 MED ORDER — TECHNETIUM TC 99M TETROFOSMIN IV KIT
10.0000 | PACK | Freq: Once | INTRAVENOUS | Status: AC | PRN
  Administered 2020-06-07: 9.1 via INTRAVENOUS

## 2020-06-07 MED ORDER — TECHNETIUM TC 99M TETROFOSMIN IV KIT
30.0000 | PACK | Freq: Once | INTRAVENOUS | Status: AC | PRN
  Administered 2020-06-07: 28.3 via INTRAVENOUS

## 2020-06-07 MED ORDER — SODIUM CHLORIDE FLUSH 0.9 % IV SOLN
INTRAVENOUS | Status: AC
  Administered 2020-06-07: 10 mL via INTRAVENOUS
  Filled 2020-06-07: qty 10

## 2020-06-08 ENCOUNTER — Encounter: Payer: Self-pay | Admitting: *Deleted

## 2020-06-20 ENCOUNTER — Encounter (HOSPITAL_COMMUNITY): Payer: Medicare HMO

## 2020-07-09 DIAGNOSIS — I10 Essential (primary) hypertension: Secondary | ICD-10-CM | POA: Diagnosis not present

## 2020-07-09 DIAGNOSIS — K219 Gastro-esophageal reflux disease without esophagitis: Secondary | ICD-10-CM | POA: Diagnosis not present

## 2020-07-09 DIAGNOSIS — E119 Type 2 diabetes mellitus without complications: Secondary | ICD-10-CM | POA: Diagnosis not present

## 2020-07-09 DIAGNOSIS — G4700 Insomnia, unspecified: Secondary | ICD-10-CM | POA: Diagnosis not present

## 2020-07-09 DIAGNOSIS — Z1211 Encounter for screening for malignant neoplasm of colon: Secondary | ICD-10-CM | POA: Diagnosis not present

## 2020-07-09 DIAGNOSIS — E782 Mixed hyperlipidemia: Secondary | ICD-10-CM | POA: Diagnosis not present

## 2020-07-09 DIAGNOSIS — I2510 Atherosclerotic heart disease of native coronary artery without angina pectoris: Secondary | ICD-10-CM | POA: Diagnosis not present

## 2020-07-09 DIAGNOSIS — F419 Anxiety disorder, unspecified: Secondary | ICD-10-CM | POA: Diagnosis not present

## 2020-07-09 DIAGNOSIS — E1169 Type 2 diabetes mellitus with other specified complication: Secondary | ICD-10-CM | POA: Diagnosis not present

## 2020-08-24 DIAGNOSIS — R110 Nausea: Secondary | ICD-10-CM | POA: Diagnosis not present

## 2020-08-24 DIAGNOSIS — S30860A Insect bite (nonvenomous) of lower back and pelvis, initial encounter: Secondary | ICD-10-CM | POA: Diagnosis not present

## 2020-08-24 DIAGNOSIS — S50311A Abrasion of right elbow, initial encounter: Secondary | ICD-10-CM | POA: Diagnosis not present

## 2020-08-24 DIAGNOSIS — E7849 Other hyperlipidemia: Secondary | ICD-10-CM | POA: Diagnosis not present

## 2020-08-24 DIAGNOSIS — B349 Viral infection, unspecified: Secondary | ICD-10-CM | POA: Diagnosis not present

## 2020-08-24 DIAGNOSIS — Z0000 Encounter for general adult medical examination without abnormal findings: Secondary | ICD-10-CM | POA: Diagnosis not present

## 2020-08-24 DIAGNOSIS — Z0001 Encounter for general adult medical examination with abnormal findings: Secondary | ICD-10-CM | POA: Diagnosis not present

## 2020-08-24 DIAGNOSIS — Z1211 Encounter for screening for malignant neoplasm of colon: Secondary | ICD-10-CM | POA: Diagnosis not present

## 2020-08-24 DIAGNOSIS — R05 Cough: Secondary | ICD-10-CM | POA: Diagnosis not present

## 2020-08-29 DIAGNOSIS — E1122 Type 2 diabetes mellitus with diabetic chronic kidney disease: Secondary | ICD-10-CM | POA: Diagnosis not present

## 2020-08-29 DIAGNOSIS — K219 Gastro-esophageal reflux disease without esophagitis: Secondary | ICD-10-CM | POA: Diagnosis not present

## 2020-08-29 DIAGNOSIS — N1832 Chronic kidney disease, stage 3b: Secondary | ICD-10-CM | POA: Diagnosis not present

## 2020-08-29 DIAGNOSIS — I5032 Chronic diastolic (congestive) heart failure: Secondary | ICD-10-CM | POA: Diagnosis not present

## 2020-08-29 DIAGNOSIS — G4709 Other insomnia: Secondary | ICD-10-CM | POA: Diagnosis not present

## 2020-08-29 DIAGNOSIS — I2510 Atherosclerotic heart disease of native coronary artery without angina pectoris: Secondary | ICD-10-CM | POA: Diagnosis not present

## 2020-08-29 DIAGNOSIS — E782 Mixed hyperlipidemia: Secondary | ICD-10-CM | POA: Diagnosis not present

## 2020-08-29 DIAGNOSIS — I10 Essential (primary) hypertension: Secondary | ICD-10-CM | POA: Diagnosis not present

## 2020-08-30 DIAGNOSIS — E119 Type 2 diabetes mellitus without complications: Secondary | ICD-10-CM | POA: Diagnosis not present

## 2020-08-30 DIAGNOSIS — F419 Anxiety disorder, unspecified: Secondary | ICD-10-CM | POA: Diagnosis not present

## 2020-08-30 DIAGNOSIS — E782 Mixed hyperlipidemia: Secondary | ICD-10-CM | POA: Diagnosis not present

## 2020-08-30 DIAGNOSIS — Z1211 Encounter for screening for malignant neoplasm of colon: Secondary | ICD-10-CM | POA: Diagnosis not present

## 2020-08-30 DIAGNOSIS — G4700 Insomnia, unspecified: Secondary | ICD-10-CM | POA: Diagnosis not present

## 2020-08-30 DIAGNOSIS — E1169 Type 2 diabetes mellitus with other specified complication: Secondary | ICD-10-CM | POA: Diagnosis not present

## 2020-08-30 DIAGNOSIS — K219 Gastro-esophageal reflux disease without esophagitis: Secondary | ICD-10-CM | POA: Diagnosis not present

## 2020-08-30 DIAGNOSIS — I2510 Atherosclerotic heart disease of native coronary artery without angina pectoris: Secondary | ICD-10-CM | POA: Diagnosis not present

## 2020-08-30 DIAGNOSIS — I10 Essential (primary) hypertension: Secondary | ICD-10-CM | POA: Diagnosis not present

## 2020-09-04 DIAGNOSIS — I509 Heart failure, unspecified: Secondary | ICD-10-CM | POA: Diagnosis not present

## 2020-09-04 DIAGNOSIS — Z7722 Contact with and (suspected) exposure to environmental tobacco smoke (acute) (chronic): Secondary | ICD-10-CM | POA: Diagnosis not present

## 2020-09-04 DIAGNOSIS — E785 Hyperlipidemia, unspecified: Secondary | ICD-10-CM | POA: Diagnosis not present

## 2020-09-04 DIAGNOSIS — D692 Other nonthrombocytopenic purpura: Secondary | ICD-10-CM | POA: Diagnosis not present

## 2020-09-04 DIAGNOSIS — Z6820 Body mass index (BMI) 20.0-20.9, adult: Secondary | ICD-10-CM | POA: Diagnosis not present

## 2020-09-04 DIAGNOSIS — I110 Hypertensive heart disease with heart failure: Secondary | ICD-10-CM | POA: Diagnosis not present

## 2020-09-04 DIAGNOSIS — K219 Gastro-esophageal reflux disease without esophagitis: Secondary | ICD-10-CM | POA: Diagnosis not present

## 2020-09-13 DIAGNOSIS — Z0001 Encounter for general adult medical examination with abnormal findings: Secondary | ICD-10-CM | POA: Diagnosis not present

## 2020-09-13 DIAGNOSIS — B349 Viral infection, unspecified: Secondary | ICD-10-CM | POA: Diagnosis not present

## 2020-09-13 DIAGNOSIS — R110 Nausea: Secondary | ICD-10-CM | POA: Diagnosis not present

## 2020-09-13 DIAGNOSIS — R0981 Nasal congestion: Secondary | ICD-10-CM | POA: Diagnosis not present

## 2020-09-13 DIAGNOSIS — S30860A Insect bite (nonvenomous) of lower back and pelvis, initial encounter: Secondary | ICD-10-CM | POA: Diagnosis not present

## 2020-09-13 DIAGNOSIS — Z0000 Encounter for general adult medical examination without abnormal findings: Secondary | ICD-10-CM | POA: Diagnosis not present

## 2020-09-13 DIAGNOSIS — S50311A Abrasion of right elbow, initial encounter: Secondary | ICD-10-CM | POA: Diagnosis not present

## 2020-09-13 DIAGNOSIS — E7849 Other hyperlipidemia: Secondary | ICD-10-CM | POA: Diagnosis not present

## 2020-09-13 DIAGNOSIS — Z23 Encounter for immunization: Secondary | ICD-10-CM | POA: Diagnosis not present

## 2020-09-13 DIAGNOSIS — Z1211 Encounter for screening for malignant neoplasm of colon: Secondary | ICD-10-CM | POA: Diagnosis not present

## 2020-09-21 DIAGNOSIS — Z1211 Encounter for screening for malignant neoplasm of colon: Secondary | ICD-10-CM | POA: Diagnosis not present

## 2020-09-21 DIAGNOSIS — E119 Type 2 diabetes mellitus without complications: Secondary | ICD-10-CM | POA: Diagnosis not present

## 2020-09-21 DIAGNOSIS — K219 Gastro-esophageal reflux disease without esophagitis: Secondary | ICD-10-CM | POA: Diagnosis not present

## 2020-09-21 DIAGNOSIS — E1169 Type 2 diabetes mellitus with other specified complication: Secondary | ICD-10-CM | POA: Diagnosis not present

## 2020-09-21 DIAGNOSIS — G4700 Insomnia, unspecified: Secondary | ICD-10-CM | POA: Diagnosis not present

## 2020-09-21 DIAGNOSIS — E782 Mixed hyperlipidemia: Secondary | ICD-10-CM | POA: Diagnosis not present

## 2020-09-21 DIAGNOSIS — I2510 Atherosclerotic heart disease of native coronary artery without angina pectoris: Secondary | ICD-10-CM | POA: Diagnosis not present

## 2020-09-21 DIAGNOSIS — F419 Anxiety disorder, unspecified: Secondary | ICD-10-CM | POA: Diagnosis not present

## 2020-09-21 DIAGNOSIS — I10 Essential (primary) hypertension: Secondary | ICD-10-CM | POA: Diagnosis not present

## 2020-10-04 DIAGNOSIS — Z1211 Encounter for screening for malignant neoplasm of colon: Secondary | ICD-10-CM | POA: Diagnosis not present

## 2020-10-04 DIAGNOSIS — K219 Gastro-esophageal reflux disease without esophagitis: Secondary | ICD-10-CM | POA: Diagnosis not present

## 2020-10-04 DIAGNOSIS — E119 Type 2 diabetes mellitus without complications: Secondary | ICD-10-CM | POA: Diagnosis not present

## 2020-10-04 DIAGNOSIS — I10 Essential (primary) hypertension: Secondary | ICD-10-CM | POA: Diagnosis not present

## 2020-10-04 DIAGNOSIS — F419 Anxiety disorder, unspecified: Secondary | ICD-10-CM | POA: Diagnosis not present

## 2020-10-04 DIAGNOSIS — I2510 Atherosclerotic heart disease of native coronary artery without angina pectoris: Secondary | ICD-10-CM | POA: Diagnosis not present

## 2020-10-04 DIAGNOSIS — E782 Mixed hyperlipidemia: Secondary | ICD-10-CM | POA: Diagnosis not present

## 2020-10-04 DIAGNOSIS — I480 Paroxysmal atrial fibrillation: Secondary | ICD-10-CM | POA: Diagnosis not present

## 2020-10-04 DIAGNOSIS — E1169 Type 2 diabetes mellitus with other specified complication: Secondary | ICD-10-CM | POA: Diagnosis not present

## 2020-10-07 ENCOUNTER — Other Ambulatory Visit: Payer: Self-pay | Admitting: Cardiology

## 2020-10-09 DIAGNOSIS — B3749 Other urogenital candidiasis: Secondary | ICD-10-CM | POA: Diagnosis not present

## 2020-10-09 DIAGNOSIS — F5221 Male erectile disorder: Secondary | ICD-10-CM | POA: Diagnosis not present

## 2020-10-09 DIAGNOSIS — E1165 Type 2 diabetes mellitus with hyperglycemia: Secondary | ICD-10-CM | POA: Diagnosis not present

## 2020-10-15 ENCOUNTER — Other Ambulatory Visit: Payer: Self-pay | Admitting: Cardiology

## 2020-10-24 DIAGNOSIS — Z7722 Contact with and (suspected) exposure to environmental tobacco smoke (acute) (chronic): Secondary | ICD-10-CM | POA: Diagnosis not present

## 2020-10-24 DIAGNOSIS — Z7982 Long term (current) use of aspirin: Secondary | ICD-10-CM | POA: Diagnosis not present

## 2020-10-24 DIAGNOSIS — D692 Other nonthrombocytopenic purpura: Secondary | ICD-10-CM | POA: Diagnosis not present

## 2020-10-24 DIAGNOSIS — I110 Hypertensive heart disease with heart failure: Secondary | ICD-10-CM | POA: Diagnosis not present

## 2020-10-24 DIAGNOSIS — Z6834 Body mass index (BMI) 34.0-34.9, adult: Secondary | ICD-10-CM | POA: Diagnosis not present

## 2020-10-24 DIAGNOSIS — I509 Heart failure, unspecified: Secondary | ICD-10-CM | POA: Diagnosis not present

## 2020-10-24 DIAGNOSIS — E119 Type 2 diabetes mellitus without complications: Secondary | ICD-10-CM | POA: Diagnosis not present

## 2020-11-06 DIAGNOSIS — M25562 Pain in left knee: Secondary | ICD-10-CM | POA: Diagnosis not present

## 2020-11-30 DIAGNOSIS — E1165 Type 2 diabetes mellitus with hyperglycemia: Secondary | ICD-10-CM | POA: Diagnosis not present

## 2020-11-30 DIAGNOSIS — F5221 Male erectile disorder: Secondary | ICD-10-CM | POA: Diagnosis not present

## 2020-11-30 DIAGNOSIS — B3749 Other urogenital candidiasis: Secondary | ICD-10-CM | POA: Diagnosis not present

## 2020-12-29 DIAGNOSIS — I10 Essential (primary) hypertension: Secondary | ICD-10-CM | POA: Diagnosis not present

## 2020-12-29 DIAGNOSIS — I5032 Chronic diastolic (congestive) heart failure: Secondary | ICD-10-CM | POA: Diagnosis not present

## 2020-12-29 DIAGNOSIS — E782 Mixed hyperlipidemia: Secondary | ICD-10-CM | POA: Diagnosis not present

## 2020-12-29 DIAGNOSIS — N1832 Chronic kidney disease, stage 3b: Secondary | ICD-10-CM | POA: Diagnosis not present

## 2020-12-29 DIAGNOSIS — G4709 Other insomnia: Secondary | ICD-10-CM | POA: Diagnosis not present

## 2020-12-29 DIAGNOSIS — I2510 Atherosclerotic heart disease of native coronary artery without angina pectoris: Secondary | ICD-10-CM | POA: Diagnosis not present

## 2020-12-29 DIAGNOSIS — E1122 Type 2 diabetes mellitus with diabetic chronic kidney disease: Secondary | ICD-10-CM | POA: Diagnosis not present

## 2020-12-29 DIAGNOSIS — K219 Gastro-esophageal reflux disease without esophagitis: Secondary | ICD-10-CM | POA: Diagnosis not present

## 2021-01-28 DIAGNOSIS — G4700 Insomnia, unspecified: Secondary | ICD-10-CM | POA: Diagnosis not present

## 2021-01-28 DIAGNOSIS — K219 Gastro-esophageal reflux disease without esophagitis: Secondary | ICD-10-CM | POA: Diagnosis not present

## 2021-01-28 DIAGNOSIS — I10 Essential (primary) hypertension: Secondary | ICD-10-CM | POA: Diagnosis not present

## 2021-01-28 DIAGNOSIS — E782 Mixed hyperlipidemia: Secondary | ICD-10-CM | POA: Diagnosis not present

## 2021-01-28 DIAGNOSIS — F419 Anxiety disorder, unspecified: Secondary | ICD-10-CM | POA: Diagnosis not present

## 2021-01-28 DIAGNOSIS — E119 Type 2 diabetes mellitus without complications: Secondary | ICD-10-CM | POA: Diagnosis not present

## 2021-01-28 DIAGNOSIS — Z1211 Encounter for screening for malignant neoplasm of colon: Secondary | ICD-10-CM | POA: Diagnosis not present

## 2021-01-28 DIAGNOSIS — I2510 Atherosclerotic heart disease of native coronary artery without angina pectoris: Secondary | ICD-10-CM | POA: Diagnosis not present

## 2021-01-28 DIAGNOSIS — E1169 Type 2 diabetes mellitus with other specified complication: Secondary | ICD-10-CM | POA: Diagnosis not present

## 2021-02-27 DIAGNOSIS — Z1211 Encounter for screening for malignant neoplasm of colon: Secondary | ICD-10-CM | POA: Diagnosis not present

## 2021-02-27 DIAGNOSIS — I10 Essential (primary) hypertension: Secondary | ICD-10-CM | POA: Diagnosis not present

## 2021-02-27 DIAGNOSIS — I2510 Atherosclerotic heart disease of native coronary artery without angina pectoris: Secondary | ICD-10-CM | POA: Diagnosis not present

## 2021-02-27 DIAGNOSIS — E1169 Type 2 diabetes mellitus with other specified complication: Secondary | ICD-10-CM | POA: Diagnosis not present

## 2021-02-27 DIAGNOSIS — E119 Type 2 diabetes mellitus without complications: Secondary | ICD-10-CM | POA: Diagnosis not present

## 2021-02-27 DIAGNOSIS — F419 Anxiety disorder, unspecified: Secondary | ICD-10-CM | POA: Diagnosis not present

## 2021-02-27 DIAGNOSIS — K219 Gastro-esophageal reflux disease without esophagitis: Secondary | ICD-10-CM | POA: Diagnosis not present

## 2021-02-27 DIAGNOSIS — G4700 Insomnia, unspecified: Secondary | ICD-10-CM | POA: Diagnosis not present

## 2021-02-27 DIAGNOSIS — E782 Mixed hyperlipidemia: Secondary | ICD-10-CM | POA: Diagnosis not present

## 2021-04-02 DIAGNOSIS — R531 Weakness: Secondary | ICD-10-CM | POA: Diagnosis not present

## 2021-04-02 DIAGNOSIS — J029 Acute pharyngitis, unspecified: Secondary | ICD-10-CM | POA: Diagnosis not present

## 2021-04-02 DIAGNOSIS — J069 Acute upper respiratory infection, unspecified: Secondary | ICD-10-CM | POA: Diagnosis not present

## 2021-04-16 DIAGNOSIS — E1165 Type 2 diabetes mellitus with hyperglycemia: Secondary | ICD-10-CM | POA: Diagnosis not present

## 2021-04-16 DIAGNOSIS — K219 Gastro-esophageal reflux disease without esophagitis: Secondary | ICD-10-CM | POA: Diagnosis not present

## 2021-05-30 DIAGNOSIS — E1165 Type 2 diabetes mellitus with hyperglycemia: Secondary | ICD-10-CM | POA: Diagnosis not present

## 2021-05-30 DIAGNOSIS — K219 Gastro-esophageal reflux disease without esophagitis: Secondary | ICD-10-CM | POA: Diagnosis not present

## 2021-06-14 DIAGNOSIS — E785 Hyperlipidemia, unspecified: Secondary | ICD-10-CM | POA: Diagnosis not present

## 2021-06-14 DIAGNOSIS — E119 Type 2 diabetes mellitus without complications: Secondary | ICD-10-CM | POA: Diagnosis not present

## 2021-06-20 DIAGNOSIS — I2510 Atherosclerotic heart disease of native coronary artery without angina pectoris: Secondary | ICD-10-CM | POA: Diagnosis not present

## 2021-06-20 DIAGNOSIS — E1169 Type 2 diabetes mellitus with other specified complication: Secondary | ICD-10-CM | POA: Diagnosis not present

## 2021-06-20 DIAGNOSIS — K219 Gastro-esophageal reflux disease without esophagitis: Secondary | ICD-10-CM | POA: Diagnosis not present

## 2021-06-20 DIAGNOSIS — E782 Mixed hyperlipidemia: Secondary | ICD-10-CM | POA: Diagnosis not present

## 2021-06-20 DIAGNOSIS — Z0001 Encounter for general adult medical examination with abnormal findings: Secondary | ICD-10-CM | POA: Diagnosis not present

## 2021-06-20 DIAGNOSIS — Z0000 Encounter for general adult medical examination without abnormal findings: Secondary | ICD-10-CM | POA: Diagnosis not present

## 2021-06-20 DIAGNOSIS — N1832 Chronic kidney disease, stage 3b: Secondary | ICD-10-CM | POA: Diagnosis not present

## 2021-06-20 DIAGNOSIS — I10 Essential (primary) hypertension: Secondary | ICD-10-CM | POA: Diagnosis not present

## 2021-06-20 DIAGNOSIS — G4700 Insomnia, unspecified: Secondary | ICD-10-CM | POA: Diagnosis not present

## 2021-06-30 DIAGNOSIS — E1165 Type 2 diabetes mellitus with hyperglycemia: Secondary | ICD-10-CM | POA: Diagnosis not present

## 2021-06-30 DIAGNOSIS — K219 Gastro-esophageal reflux disease without esophagitis: Secondary | ICD-10-CM | POA: Diagnosis not present

## 2021-07-11 ENCOUNTER — Inpatient Hospital Stay (HOSPITAL_COMMUNITY)
Admission: EM | Admit: 2021-07-11 | Discharge: 2021-07-14 | DRG: 814 | Disposition: A | Discharge status: Home / Self Care | Payer: Medicare HMO | Attending: Internal Medicine | Admitting: Internal Medicine

## 2021-07-11 ENCOUNTER — Emergency Department (HOSPITAL_COMMUNITY): Payer: Medicare HMO

## 2021-07-11 DIAGNOSIS — E7800 Pure hypercholesterolemia, unspecified: Secondary | ICD-10-CM | POA: Diagnosis present

## 2021-07-11 DIAGNOSIS — I4510 Unspecified right bundle-branch block: Secondary | ICD-10-CM | POA: Diagnosis present

## 2021-07-11 DIAGNOSIS — Z6832 Body mass index (BMI) 32.0-32.9, adult: Secondary | ICD-10-CM

## 2021-07-11 DIAGNOSIS — I130 Hypertensive heart and chronic kidney disease with heart failure and stage 1 through stage 4 chronic kidney disease, or unspecified chronic kidney disease: Secondary | ICD-10-CM | POA: Diagnosis not present

## 2021-07-11 DIAGNOSIS — I517 Cardiomegaly: Secondary | ICD-10-CM | POA: Diagnosis not present

## 2021-07-11 DIAGNOSIS — Z951 Presence of aortocoronary bypass graft: Secondary | ICD-10-CM | POA: Diagnosis not present

## 2021-07-11 DIAGNOSIS — R680 Hypothermia, not associated with low environmental temperature: Secondary | ICD-10-CM | POA: Diagnosis present

## 2021-07-11 DIAGNOSIS — G9341 Metabolic encephalopathy: Secondary | ICD-10-CM | POA: Diagnosis present

## 2021-07-11 DIAGNOSIS — I10 Essential (primary) hypertension: Secondary | ICD-10-CM | POA: Diagnosis present

## 2021-07-11 DIAGNOSIS — E1159 Type 2 diabetes mellitus with other circulatory complications: Secondary | ICD-10-CM

## 2021-07-11 DIAGNOSIS — F419 Anxiety disorder, unspecified: Secondary | ICD-10-CM | POA: Diagnosis not present

## 2021-07-11 DIAGNOSIS — Z79899 Other long term (current) drug therapy: Secondary | ICD-10-CM | POA: Diagnosis not present

## 2021-07-11 DIAGNOSIS — E669 Obesity, unspecified: Secondary | ICD-10-CM | POA: Diagnosis not present

## 2021-07-11 DIAGNOSIS — I252 Old myocardial infarction: Secondary | ICD-10-CM | POA: Diagnosis not present

## 2021-07-11 DIAGNOSIS — Z20822 Contact with and (suspected) exposure to covid-19: Secondary | ICD-10-CM | POA: Diagnosis present

## 2021-07-11 DIAGNOSIS — K219 Gastro-esophageal reflux disease without esophagitis: Secondary | ICD-10-CM | POA: Diagnosis present

## 2021-07-11 DIAGNOSIS — I5032 Chronic diastolic (congestive) heart failure: Secondary | ICD-10-CM | POA: Diagnosis not present

## 2021-07-11 DIAGNOSIS — T68XXXA Hypothermia, initial encounter: Principal | ICD-10-CM

## 2021-07-11 DIAGNOSIS — R6510 Systemic inflammatory response syndrome (SIRS) of non-infectious origin without acute organ dysfunction: Secondary | ICD-10-CM | POA: Diagnosis present

## 2021-07-11 DIAGNOSIS — I2510 Atherosclerotic heart disease of native coronary artery without angina pectoris: Secondary | ICD-10-CM | POA: Diagnosis present

## 2021-07-11 DIAGNOSIS — D72829 Elevated white blood cell count, unspecified: Principal | ICD-10-CM | POA: Diagnosis present

## 2021-07-11 DIAGNOSIS — I5042 Chronic combined systolic (congestive) and diastolic (congestive) heart failure: Secondary | ICD-10-CM

## 2021-07-11 DIAGNOSIS — E119 Type 2 diabetes mellitus without complications: Secondary | ICD-10-CM

## 2021-07-11 DIAGNOSIS — E1122 Type 2 diabetes mellitus with diabetic chronic kidney disease: Secondary | ICD-10-CM | POA: Diagnosis present

## 2021-07-11 DIAGNOSIS — Z8249 Family history of ischemic heart disease and other diseases of the circulatory system: Secondary | ICD-10-CM | POA: Diagnosis not present

## 2021-07-11 DIAGNOSIS — N183 Chronic kidney disease, stage 3 unspecified: Secondary | ICD-10-CM | POA: Diagnosis present

## 2021-07-11 DIAGNOSIS — E876 Hypokalemia: Secondary | ICD-10-CM | POA: Diagnosis present

## 2021-07-11 DIAGNOSIS — I110 Hypertensive heart disease with heart failure: Secondary | ICD-10-CM

## 2021-07-11 DIAGNOSIS — E860 Dehydration: Secondary | ICD-10-CM | POA: Diagnosis present

## 2021-07-11 DIAGNOSIS — E1165 Type 2 diabetes mellitus with hyperglycemia: Secondary | ICD-10-CM

## 2021-07-11 DIAGNOSIS — Z833 Family history of diabetes mellitus: Secondary | ICD-10-CM

## 2021-07-11 DIAGNOSIS — I4891 Unspecified atrial fibrillation: Secondary | ICD-10-CM | POA: Diagnosis present

## 2021-07-11 DIAGNOSIS — Z794 Long term (current) use of insulin: Secondary | ICD-10-CM

## 2021-07-11 DIAGNOSIS — Z7984 Long term (current) use of oral hypoglycemic drugs: Secondary | ICD-10-CM

## 2021-07-11 DIAGNOSIS — E872 Acidosis: Secondary | ICD-10-CM | POA: Diagnosis not present

## 2021-07-11 DIAGNOSIS — R9431 Abnormal electrocardiogram [ECG] [EKG]: Secondary | ICD-10-CM | POA: Diagnosis not present

## 2021-07-11 DIAGNOSIS — I5030 Unspecified diastolic (congestive) heart failure: Secondary | ICD-10-CM

## 2021-07-11 DIAGNOSIS — Z7982 Long term (current) use of aspirin: Secondary | ICD-10-CM

## 2021-07-11 LAB — URINALYSIS, ROUTINE W REFLEX MICROSCOPIC
Bacteria, UA: NONE SEEN
Bilirubin Urine: NEGATIVE
Glucose, UA: >=500 mg/dL — AB
Hgb urine dipstick: NEGATIVE
Ketones, ur: 5 mg/dL — AB
Leukocytes,Ua: NEGATIVE
Nitrite: NEGATIVE
Protein, ur: NEGATIVE mg/dL
Specific Gravity, Urine: 1.025 (ref 1.005–1.030)
pH: 5 (ref 5.0–8.0)

## 2021-07-11 LAB — CBC
HCT: 42.7 % (ref 39.0–52.0)
Hemoglobin: 14.4 g/dL (ref 13.0–17.0)
MCH: 30.3 pg (ref 26.0–34.0)
MCHC: 33.7 g/dL (ref 30.0–36.0)
MCV: 89.9 fL (ref 80.0–100.0)
Platelets: 218 10*3/uL (ref 150–400)
RBC: 4.75 MIL/uL (ref 4.22–5.81)
RDW: 13.7 % (ref 11.5–15.5)
WBC: 14.2 10*3/uL — ABNORMAL HIGH (ref 4.0–10.5)
nRBC: 0 % (ref 0.0–0.2)

## 2021-07-11 LAB — LACTIC ACID, PLASMA
Lactic Acid, Venous: 2 mmol/L (ref 0.5–1.9)
Lactic Acid, Venous: 1.7 mmol/L (ref 0.5–1.9)

## 2021-07-11 LAB — BASIC METABOLIC PANEL
Anion gap: 10 (ref 5–15)
BUN: 21 mg/dL (ref 8–23)
CO2: 22 mmol/L (ref 22–32)
Calcium: 9 mg/dL (ref 8.9–10.3)
Chloride: 103 mmol/L (ref 98–111)
Creatinine, Ser: 1.08 mg/dL (ref 0.61–1.24)
GFR, Estimated: >60 mL/min (ref >60)
Glucose, Bld: 242 mg/dL — ABNORMAL HIGH (ref 70–99)
Potassium: 3.1 mmol/L — ABNORMAL LOW (ref 3.5–5.1)
Sodium: 135 mmol/L (ref 135–145)

## 2021-07-11 LAB — RESP PANEL BY RT-PCR (FLU A&B, COVID) ARPGX2
Influenza A by PCR: NEGATIVE
Influenza B by PCR: NEGATIVE
SARS Coronavirus 2 by RT PCR: NEGATIVE

## 2021-07-11 LAB — CBG MONITORING, ED
Glucose-Capillary: 215 mg/dL — ABNORMAL HIGH (ref 70–99)
Glucose-Capillary: 222 mg/dL — ABNORMAL HIGH (ref 70–99)
Glucose-Capillary: 179 mg/dL — ABNORMAL HIGH (ref 70–99)

## 2021-07-11 LAB — MAGNESIUM: Magnesium: 1.8 mg/dL (ref 1.7–2.4)

## 2021-07-11 LAB — ETHANOL: Alcohol, Ethyl (B): <10 mg/dL (ref <10)

## 2021-07-11 LAB — CK: Total CK: 102 U/L (ref 49–397)

## 2021-07-11 LAB — APTT: aPTT: 28 seconds (ref 24–36)

## 2021-07-11 LAB — PROTIME-INR
INR: 1 (ref 0.8–1.2)
Prothrombin Time: 13 seconds (ref 11.4–15.2)

## 2021-07-11 LAB — PROCALCITONIN: Procalcitonin: <0.1 ng/mL

## 2021-07-11 LAB — TROPONIN I (HIGH SENSITIVITY)
Troponin I (High Sensitivity): 7 ng/L (ref <18)
Troponin I (High Sensitivity): 8 ng/L (ref <18)

## 2021-07-11 MED ORDER — ATORVASTATIN CALCIUM 40 MG PO TABS
80.0000 mg | ORAL_TABLET | Freq: Every day | ORAL | Status: DC
  Administered 2021-07-12 – 2021-07-13 (×2): 80 mg via ORAL
  Filled 2021-07-11 (×2): qty 2

## 2021-07-11 MED ORDER — LACTATED RINGERS IV BOLUS (SEPSIS)
1000.0000 mL | Freq: Once | INTRAVENOUS | Status: AC
  Administered 2021-07-11: 1000 mL via INTRAVENOUS

## 2021-07-11 MED ORDER — ACETAMINOPHEN 325 MG PO TABS: 650.0000 mg | ORAL_TABLET | Freq: Four times a day (QID) | ORAL | Status: DC | PRN

## 2021-07-11 MED ORDER — ASPIRIN EC 81 MG PO TBEC
81.0000 mg | DELAYED_RELEASE_TABLET | Freq: Every day | ORAL | Status: DC
  Administered 2021-07-12 – 2021-07-14 (×3): 81 mg via ORAL
  Filled 2021-07-11 (×3): qty 1

## 2021-07-11 MED ORDER — LACTATED RINGERS IV BOLUS (SEPSIS): 1000.0000 mL | Freq: Once | INTRAVENOUS | Status: DC

## 2021-07-11 MED ORDER — ONDANSETRON HCL 4 MG PO TABS: 4.0000 mg | ORAL_TABLET | Freq: Four times a day (QID) | ORAL | Status: DC | PRN

## 2021-07-11 MED ORDER — MELATONIN 5 MG PO TABS
10.0000 mg | ORAL_TABLET | Freq: Every day | ORAL | Status: DC
  Filled 2021-07-11 (×4): qty 2

## 2021-07-11 MED ORDER — PANTOPRAZOLE SODIUM 40 MG PO TBEC
40.0000 mg | DELAYED_RELEASE_TABLET | Freq: Every day | ORAL | Status: DC
  Administered 2021-07-12 – 2021-07-13 (×2): 40 mg via ORAL
  Filled 2021-07-11 (×3): qty 1

## 2021-07-11 MED ORDER — INSULIN ASPART 100 UNIT/ML IJ SOLN
0.0000 [IU] | Freq: Three times a day (TID) | INTRAMUSCULAR | Status: DC
  Administered 2021-07-12: 3 [IU] via SUBCUTANEOUS
  Administered 2021-07-12: 2 [IU] via SUBCUTANEOUS
  Administered 2021-07-12 – 2021-07-13 (×3): 3 [IU] via SUBCUTANEOUS
  Administered 2021-07-13 – 2021-07-14 (×2): 2 [IU] via SUBCUTANEOUS
  Filled 2021-07-11 (×3): qty 1

## 2021-07-11 MED ORDER — POTASSIUM CHLORIDE 10 MEQ/100ML IV SOLN
10.0000 meq | Freq: Once | INTRAVENOUS | Status: AC
  Administered 2021-07-11: 10 meq via INTRAVENOUS
  Filled 2021-07-11: qty 100

## 2021-07-11 MED ORDER — LISINOPRIL 5 MG PO TABS
5.0000 mg | ORAL_TABLET | Freq: Every day | ORAL | Status: DC
  Administered 2021-07-12 – 2021-07-13 (×2): 5 mg via ORAL
  Filled 2021-07-11 (×3): qty 1

## 2021-07-11 MED ORDER — METRONIDAZOLE 500 MG/100ML IV SOLN
500.0000 mg | Freq: Three times a day (TID) | INTRAVENOUS | Status: AC
  Administered 2021-07-12: 500 mg via INTRAVENOUS
  Filled 2021-07-11: qty 100

## 2021-07-11 MED ORDER — ACETAMINOPHEN 650 MG RE SUPP: 650.0000 mg | Freq: Four times a day (QID) | RECTAL | Status: DC | PRN

## 2021-07-11 MED ORDER — METOPROLOL TARTRATE 50 MG PO TABS
50.0000 mg | ORAL_TABLET | Freq: Two times a day (BID) | ORAL | Status: DC
  Administered 2021-07-11 – 2021-07-14 (×5): 50 mg via ORAL
  Filled 2021-07-11 (×6): qty 1

## 2021-07-11 MED ORDER — POTASSIUM CHLORIDE CRYS ER 20 MEQ PO TBCR
40.0000 meq | EXTENDED_RELEASE_TABLET | Freq: Once | ORAL | Status: AC
  Administered 2021-07-11: 40 meq via ORAL
  Filled 2021-07-11: qty 2

## 2021-07-11 MED ORDER — ALPRAZOLAM 0.5 MG PO TABS: 0.5000 mg | ORAL_TABLET | Freq: Every evening | ORAL | Status: DC | PRN

## 2021-07-11 MED ORDER — MELATONIN 10 MG PO TABS: 30.0000 | ORAL_TABLET | Freq: Every evening | ORAL | Status: DC

## 2021-07-11 MED ORDER — VANCOMYCIN HCL IN DEXTROSE 1-5 GM/200ML-% IV SOLN
1000.0000 mg | Freq: Two times a day (BID) | INTRAVENOUS | Status: DC
  Administered 2021-07-12 – 2021-07-13 (×3): 1000 mg via INTRAVENOUS
  Filled 2021-07-11 (×3): qty 200

## 2021-07-11 MED ORDER — LACTATED RINGERS IV SOLN: INTRAVENOUS | Status: DC

## 2021-07-11 MED ORDER — SODIUM CHLORIDE 0.9 % IV SOLN
2.0000 g | Freq: Once | INTRAVENOUS | Status: AC
  Administered 2021-07-11: 2 g via INTRAVENOUS
  Filled 2021-07-11: qty 2

## 2021-07-11 MED ORDER — ONDANSETRON HCL 4 MG/2ML IJ SOLN
4.0000 mg | Freq: Four times a day (QID) | INTRAMUSCULAR | Status: DC | PRN
  Administered 2021-07-11: 4 mg via INTRAVENOUS
  Filled 2021-07-11: qty 2

## 2021-07-11 MED ORDER — INSULIN ASPART 100 UNIT/ML IJ SOLN: 0.0000 [IU] | Freq: Every day | INTRAMUSCULAR | Status: DC

## 2021-07-11 MED ORDER — VANCOMYCIN HCL 2000 MG/400ML IV SOLN
2000.0000 mg | Freq: Once | INTRAVENOUS | Status: AC
  Administered 2021-07-11: 2000 mg via INTRAVENOUS
  Filled 2021-07-11: qty 400

## 2021-07-11 MED ORDER — POLYETHYLENE GLYCOL 3350 17 G PO PACK: 17.0000 g | PACK | Freq: Every day | ORAL | Status: DC | PRN

## 2021-07-11 MED ORDER — SODIUM CHLORIDE 0.9 % IV SOLN
2.0000 g | Freq: Three times a day (TID) | INTRAVENOUS | Status: DC
  Administered 2021-07-11 – 2021-07-13 (×5): 2 g via INTRAVENOUS
  Filled 2021-07-11 (×5): qty 2

## 2021-07-11 MED ORDER — METRONIDAZOLE 500 MG/100ML IV SOLN
500.0000 mg | Freq: Once | INTRAVENOUS | Status: AC
  Administered 2021-07-11: 500 mg via INTRAVENOUS
  Filled 2021-07-11: qty 100

## 2021-07-11 MED ORDER — VANCOMYCIN HCL IN DEXTROSE 1-5 GM/200ML-% IV SOLN: 1000.0000 mg | Freq: Once | INTRAVENOUS | Status: DC

## 2021-07-11 MED ORDER — ENOXAPARIN SODIUM 40 MG/0.4ML IJ SOSY: 40.0000 mg | PREFILLED_SYRINGE | INTRAMUSCULAR | Status: DC

## 2021-07-11 MED ORDER — ENOXAPARIN SODIUM 60 MG/0.6ML IJ SOSY: 0.5000 mg/kg | PREFILLED_SYRINGE | INTRAMUSCULAR | Status: DC

## 2021-07-11 MED ORDER — LACTATED RINGERS IV BOLUS (SEPSIS): 500.0000 mL | Freq: Once | INTRAVENOUS | Status: DC

## 2021-07-11 NOTE — ED Notes (Signed)
Pt placed on bear hugger.  

## 2021-07-11 NOTE — Sepsis Progress Note (Signed)
ELink monitoring sepsis protocol 

## 2021-07-11 NOTE — Progress Notes (Signed)
Pharmacy Antibiotic Note  Alejandro [name] is a 80 y.o. male admitted on 07/11/2021 with  unknown source .  Pharmacy has been consulted for vancomycin and cefepime dosing.  Plan: Vancomycin 2000mg  IV loading dose, then 1000mg  IV q12h for AUC 425 Cefepime 2gm IV q8h F/U cxs and clinical progress Monitor V/S, labs and levels as indicated  Height: 5\' 11"  (180.3 cm) Weight: 112.9 kg (249 lb) IBW/kg (Calculated) : 75.3  Temp (24hrs), Avg:94.8 F (34.9 C), Min:94.8 F (34.9 C), Max:94.8 F (34.9 C)  Recent Labs  Lab 07/11/21 1337  WBC 14.2*  CREATININE 1.08    Estimated Creatinine Clearance: 72 mL/min (by C-G formula based on SCr of 1.08 mg/dL).    No Known Allergies  Antimicrobials this admission: Vancomycin  8/11 >>  Cefepime  8/11 >>   Microbiology results: 8/11 BCx: pending 8/11 UCx: TBC   MRSA PCR:   Thank you for allowing pharmacy to be a part of this patient's care.  Isac Sarna, BS [place], [place] Clinical Pharmacist Pager [phone] 07/11/2021 2:38 PM

## 2021-07-11 NOTE — ED Triage Notes (Signed)
States he went to work this am and started staggering around, denies pain

## 2021-07-11 NOTE — Sepsis Progress Note (Signed)
[place] Code Sepsis Completion Note:  Patient had BC drawn before ABX was administered. Had approx 2,750 ml of the allotted 3,387 ml per kg. MD note to hold further fluid resuscitation. Admitted to stepdown unit for further monitoring.  Solmon Ice Alejandro Olejnik DNP Elink RN 2006 PM

## 2021-07-11 NOTE — ED Notes (Signed)
Pt taking off warming blanket at this time

## 2021-07-11 NOTE — ED Notes (Signed)
Pt was given graham crackers at this time along with peanut butter . Alejandro [name]

## 2021-07-11 NOTE — H&P (Addendum)
History and Physical    DAMONTAE LOPPNOW WPY:[id number] DOB: [birth year] DOA: 07/11/2021  PCP: [facility] Squibb, MD   Patient coming from: Home  I have personally briefly reviewed patient's old medical records in [place]  Chief Complaint: Dizziness  HPI: Alejandro [name] is a 80 y.o. male with medical history significant for hypertension, diabetes mellitus, diastolic CHF, CABG. Patient was brought to the ED reports that he was staggering around today at work.  At the time of my evaluation, patient is lethargic, voice is hoarse, but he is answering questions appropriately, history is obtained from an employee of patient who is at [employer] [place].   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  Patient's coworker reports that over the past few days patient has been walking a lot outside particularly yesterday he was walking outside and did not keep up with hydration.  Today also he was outside, and was riding a truck with no air conditioner.  Today coworker noticed patient's was confused, and almost fell. Report 1 episode of vomiting today.  Patient on my evaluation has no complaints.  He denies chest pain, no cough no difficulty breathing, no abdominal pain, no loose stools.  ED Course: Temperature 94.8.  Heart rate heart rate in the 40s initially lowest of 41, currently improved 57.  Blood pressure 120s to 150s.  O2 sats greater than 94% on room air.  Chest x-ray shows cardiomegaly and vascular congestion without frank pulmonary edema.  WBC 14.2.  Lactic acid 2.  Troponin 7.  Low Potassium 3.1.  Magnesium 1.8. EKG showed junctional rhythm with right bundle branch block QTC 638, ED provider discussed EKG with Dr. Olga Millers, felt EKG changes were due to metabolic derangements, infection. Broad-spectrum antibiotics Vanco, metronidazole.  2 L LR bolus given.  Hospitalist to admit.   Review of Systems: As per HPI all other systems reviewed and negative.  Past  Medical History:  Diagnosis Date   Aortic insufficiency    a. mild-mod by intraop TEE 05/2018 (no aortic stenosis).   CAD (coronary artery disease)    a. NSTEMI 05/2018 with multivessel disease -> s/p CABGx4 05/11/18.   Chronic diastolic CHF (congestive heart failure) ([place])    a. dx 05/2018.   CKD (chronic kidney disease), stage III    History of nephrolithiasis 2004   Hyperlipidemia    Hypertension    IBS (irritable colon syndrome)    Postoperative atrial fibrillation ([facility])    PVC's (premature ventricular contractions)    Reflux    Type 2 diabetes mellitus ([place])     Past Surgical History:  Procedure Laterality Date   CORONARY ARTERY BYPASS GRAFT N/A 05/11/2018   Procedure: CORONARY ARTERY BYPASS GRAFTING (CABG), on pump, times four, using left internal mammary artery and endoscopically harvested right greater saphenous leg vein.;  Surgeon: Ivin Poot, MD;  Location: [facility];  Service: Open Heart Surgery;  Laterality: N/A;   ESOPHAGOGASTRODUODENOSCOPY  02/04/2012   Procedure: ESOPHAGOGASTRODUODENOSCOPY (EGD);  Surgeon: Rogene Houston, MD;  [facility];  Service: Endoscopy;  Laterality: N/A;  100   LEFT HEART CATH AND CORONARY ANGIOGRAPHY N/A 05/04/2018   Procedure: LEFT HEART CATH AND CORONARY ANGIOGRAPHY;  Surgeon: Belva Crome, MD;  Location: [facility];  Service: Cardiovascular;  Laterality: N/A;   NASAL FRACTURE SURGERY     TEE WITHOUT CARDIOVERSION N/A 05/11/2018   Procedure: TRANSESOPHAGEAL ECHOCARDIOGRAM (TEE);  Surgeon: Prescott Gum, Collier Salina, MD;  [facility];  Service: Open Heart Surgery;  Laterality: N/A;     reports that he has never smoked. He has never used smokeless tobacco. He reports that he does not drink alcohol and does not use drugs.  No Known Allergies  Family History  Problem Relation Age of Onset   Diabetes Mellitus II Mother    Heart attack Father    Heart attack Brother    Heart disease Other    Arthritis Other    Asthma Other     Diabetes Other     Prior to Admission medications   Medication Sig Start Date End Date Taking? Authorizing Provider  ALPRAZolam Duanne Moron) 0.5 MG tablet Take 0.5 mg by mouth at bedtime as needed for sleep.    [provider]  aspirin EC 81 MG tablet Take 1 tablet (81 mg total) by mouth daily. 01/18/20   Strader, Fransisco Hertz, PA-C  atorvastatin (LIPITOR) 80 MG tablet TAKE 1 TABLET BY MOUTH EVERY DAY AT 6 PM 08/04/18   Satira Sark, MD  furosemide (LASIX) 20 MG tablet Take 20mg  (1 tablet) once daily for 7 days then as needed for edema or weight gain. 01/20/20   Strader, Fransisco Hertz, PA-C  hydrALAZINE (APRESOLINE) 25 MG tablet Take 1 tablet (25 mg total) by mouth every 8 (eight) hours. 05/17/18   [name], PA-C  metoprolol tartrate (LOPRESSOR) 50 MG tablet TAKE 1 TABLET(50 MG) BY MOUTH TWICE DAILY 10/15/20   Satira Sark, MD  pantoprazole (PROTONIX) 40 MG tablet Take 1 tablet by mouth daily. 02/07/20   [provider]  sitaGLIPtan-metformin (JANUMET) 50-1000 MG per tablet Take 1 tablet by mouth 2 (two) times daily with a meal.      [provider]    Physical Exam: Vitals:   07/11/21 1345 07/11/21 1400 07/11/21 1402 07/11/21 1500  BP:  128/67  (!) 141/60  Pulse: (!) 42 (!) 42  74  Resp: 14 15 15 10   Temp:   (!) 94.8 F (34.9 C)   TempSrc:   Rectal   SpO2: 94% 96% 96% 95%  Weight:      Height:        Constitutional: Lethargic, hoarse reports from vomiting, but answers questions appropriately,, calm, comfortable Vitals:   07/11/21 1345 07/11/21 1400 07/11/21 1402 07/11/21 1500  BP:  128/67  (!) 141/60  Pulse: (!) 42 (!) 42  74  Resp: 14 15 15 10   Temp:   (!) 94.8 F (34.9 C)   TempSrc:   Rectal   SpO2: 94% 96% 96% 95%  Weight:      Height:       Eyes: PERRL, lids and conjunctivae normal ENMT: Mucous membranes are dry Neck: normal, supple, no masses, no thyromegaly Respiratory: clear to auscultation bilaterally, no wheezing, no crackles. Normal  respiratory effort. No accessory muscle use.  Cardiovascular: Bradycardic, regular rate and rhythm, 3/6 known systolic murmurs / rubs / gallops. No extremity edema. 2+ pedal pulses.   Abdomen: no tenderness, no masses palpated. No hepatosplenomegaly. Bowel sounds positive.  Musculoskeletal: no clubbing / cyanosis. No joint deformity upper and lower extremities. Good ROM, no contractures. Normal muscle tone.  Skin: no rashes, lesions, ulcers. No induration Neurologic: No apparent cranial nerve abnormality, moving extremities spontaneously. Psychiatric: Normal judgment and insight.  Lethargic and oriented x 3. Normal mood.   Labs on Admission: I have personally reviewed following labs and imaging studies  CBC: Recent Labs  Lab 07/11/21 1337  WBC 14.2*  HGB 14.4  HCT 42.7  MCV 89.9  PLT 998   Basic Metabolic Panel: Recent Labs  Lab 07/11/21 1337  NA 135  K 3.1*  CL 103  CO2 22  GLUCOSE 242*  BUN 21  CREATININE 1.08  CALCIUM 9.0   Coagulation Profile: Recent Labs  Lab 07/11/21 1434  INR 1.0   CBG: Recent Labs  Lab 07/11/21 1338 07/11/21 1427  GLUCAP 222* 215*   Urine analysis:    Component Value Date/Time   COLORURINE STRAW (A) 07/11/2021 1530   APPEARANCEUR CLEAR 07/11/2021 1530   LABSPEC 1.025 07/11/2021 1530   PHURINE 5.0 07/11/2021 1530   GLUCOSEU >=500 (A) 07/11/2021 1530   HGBUR NEGATIVE 07/11/2021 1530   BILIRUBINUR NEGATIVE 07/11/2021 1530   KETONESUR 5 (A) 07/11/2021 1530   PROTEINUR NEGATIVE 07/11/2021 1530   NITRITE NEGATIVE 07/11/2021 1530   LEUKOCYTESUR NEGATIVE 07/11/2021 1530    Radiological Exams on Admission: DG Chest Port 1 View  Result Date: 07/11/2021 CLINICAL DATA:  Questionable sepsis EXAM: PORTABLE CHEST 1 VIEW COMPARISON:  Chest radiograph 01/03/2020 FINDINGS: Median sternotomy wires and mediastinal surgical clips are again noted. The heart is enlarged, not significantly changed. The mediastinal contours are otherwise within normal  limits. There is vascular congestion without frank pulmonary edema. There is no focal consolidation. There is no significant pleural effusion. There is no appreciable pneumothorax. The bones are unremarkable. IMPRESSION: Cardiomegaly and  vascular congestion without frank pulmonary edema. Electronically Signed   By: Valetta Mole MD   On: 07/11/2021 14:46    EKG: Independently reviewed.  Junctional rhythm, right bundle branch block or QTC 638.  EKG different from prior, with new wide QRS complexes.  Assessment/Plan Principal Problem:   SIRS (systemic inflammatory response syndrome) ([facility]) Active Problems:   Hypertension   Diabetes mellitus ([facility])   S/P CABG x 4   SIRS-hypothermic down to 94.8, with bradycardia, leukocytosis of 14.2.  Lactic acid 2  >> 1.7.  UA not suggestive of infection, chest x-ray shows vascular congestion without frank edema.  Denies GI GU symptoms, unremarkable exam.  COVID test negative.  Reported prolonged exposure outside, but CK normal, and patient hypothermic. -Follow-up blood and urine cultures -Check procalcitonin and trend -2 L bolus given, with maintenance fluids in ED at 150cc rate, he has gotten about 2.750 L of fluid. -Hold further IV fluids for now - Bair huggers -Continue broad-spectrum antibiotics Vanco, cefepime and metronidazole  Metabolic encephalopathy-confusion has improved with hydration in the ED. Likely from dehydration, SIRS. -Head CT deferred for now with improvement in mental status.  Hypokalemia-potassium 3.1.  Magnesium 1.8. -Replete K  Abnormal EKG, CABG x4-EKG different from prior, new wide QRS complexes.  Denies chest pain.  Troponin 7 > 8.  Last echo 2021, EF 45% with grade 3 diastolic dysfunction (restrictive), mild aortic stenosis - EDP discussed EKG with Dr. Olga Millers, felt changes secondary to hypothermia, metabolic derangements, possible infection -Repeat EKG in the morning -Resume atorvastatin, aspirin  HTN-stable. -Hold HCTZ for  now with hydration. -Resume lisinopril, metoprolol,   Diastolic CHF-stable and compensated. Last echo 2021, EF 45% with grade 3 diastolic dysfunction (restrictive), Last echo 2021, mild aortic stenosis. -Hold home 20mg  Lasix.  Diabetes mellitus, random glucose 242 - SSI- M -Hold home Jardiance, metformin - Hgba1c   DVT prophylaxis: Lovenox Code Status: Full code Family Communication: No family at bedside, but patient's employee- Renee at bedside. Disposition Plan: ~ /> 2 days Consults called:  None Admission status: Inpt, step down I certify  that at the point of admission it is my clinical judgment that the patient will require inpatient hospital care spanning beyond 2 midnights from the point of admission due to high intensity of service, high risk for further deterioration and high frequency of surveillance required. The following factors support the patient status of inpatient:    Bethena Roys MD [facility]  07/11/2021, 9:12 PM

## 2021-07-11 NOTE — ED Notes (Signed)
pts rectal temp 94. MD aware

## 2021-07-11 NOTE — ED Provider Notes (Signed)
[facility] EMERGENCY DEPARTMENT Provider Note   CSN: [id number] Arrival date & time: 07/11/21  1304     History Chief Complaint  Patient presents with   Dizziness    Alejandro [name] is a 80 y.o. male.  Patient with dizziness today.  He was working outside a lot in the heat.  He presents hypothymic  The history is provided by the patient and medical records. No language interpreter was used.  Dizziness Quality:  Lightheadedness Severity:  Mild Onset quality:  Sudden Duration:  8 hours Timing:  Intermittent Progression:  Waxing and waning Chronicity:  New Context: not when bending over   Relieved by:  Nothing Worsened by:  Nothing Ineffective treatments:  None tried Associated symptoms: no chest pain, no diarrhea and no headaches       Past Medical History:  Diagnosis Date   Aortic insufficiency    a. mild-mod by intraop TEE 05/2018 (no aortic stenosis).   CAD (coronary artery disease)    a. NSTEMI 05/2018 with multivessel disease -> s/p CABGx4 05/11/18.   Chronic diastolic CHF (congestive heart failure) ([place])    a. dx 05/2018.   CKD (chronic kidney disease), stage III    History of nephrolithiasis 2004   Hyperlipidemia    Hypertension    IBS (irritable colon syndrome)    Postoperative atrial fibrillation (HCC)    PVC's (premature ventricular contractions)    Reflux    Type 2 diabetes mellitus ([place])     Patient Active Problem List   Diagnosis Date Noted   S/P CABG x 4 05/11/2018   CKD (chronic kidney disease), stage III ([place]) 05/07/2018   Obesity 05/07/2018   Aortic stenosis 05/07/2018   Non-ST elevation (NSTEMI) myocardial infarction ([place]) 05/07/2018   CAD (coronary artery disease) 12/09/3233   Acute diastolic CHF (congestive heart failure) (HCC)    SOB (shortness of breath)    Leukocytosis 05/02/2018   Flash pulmonary edema ([place]) 05/02/2018   Diabetic neuropathy with neurologic complication ([place]) 57/32/2025   Spinal stenosis of lumbar region 03/29/2013    Melena 03/02/2013   GERD (gastroesophageal reflux disease) 42/70/6237   Helicobacter pylori (H. pylori) 09/02/2012   Hypertension 01/08/2012   Diabetes mellitus ([place]) 01/08/2012   High cholesterol     Past Surgical History:  Procedure Laterality Date   CORONARY ARTERY BYPASS GRAFT N/A 05/11/2018   Procedure: CORONARY ARTERY BYPASS GRAFTING (CABG), on pump, times four, using left internal mammary artery and endoscopically harvested right greater saphenous leg vein.;  Surgeon: Ivin Poot, MD;  Location: [place];  Service: Open Heart Surgery;  Laterality: N/A;   ESOPHAGOGASTRODUODENOSCOPY  02/04/2012   Procedure: ESOPHAGOGASTRODUODENOSCOPY (EGD);  Surgeon: Rogene Houston, MD;  [facility];  Service: Endoscopy;  Laterality: N/A;  100   LEFT HEART CATH AND CORONARY ANGIOGRAPHY N/A 05/04/2018   Procedure: LEFT HEART CATH AND CORONARY ANGIOGRAPHY;  Surgeon: Belva Crome, MD;  Location: [place] [facility];  Service: Cardiovascular;  Laterality: N/A;   NASAL FRACTURE SURGERY     TEE WITHOUT CARDIOVERSION N/A 05/11/2018   Procedure: TRANSESOPHAGEAL ECHOCARDIOGRAM (TEE);  Surgeon: Prescott Gum, Collier Salina, MD;  Location: [place];  Service: Open Heart Surgery;  Laterality: N/A;       Family History  Problem Relation Age of Onset   Diabetes Mellitus II Mother    Heart attack Father    Heart attack Brother    Heart disease Other    Arthritis Other    Asthma Other  Diabetes Other     Social History   Tobacco Use   Smoking status: Never   Smokeless tobacco: Never  Vaping Use   Vaping Use: Never used  Substance Use Topics   Alcohol use: No    Alcohol/week: 0.0 standard drinks   Drug use: No    Home Medications Prior to Admission medications   Medication Sig Start Date End Date Taking? Authorizing Provider  ALPRAZolam Duanne Moron) 0.5 MG tablet Take 0.5 mg by mouth at bedtime as needed for sleep.    [provider]  aspirin EC 81 MG tablet Take 1 tablet (81 mg total) by  mouth daily. 01/18/20   Strader, Fransisco Hertz, PA-C  atorvastatin (LIPITOR) 80 MG tablet TAKE 1 TABLET BY MOUTH EVERY DAY AT 6 PM 08/04/18   Satira Sark, MD  furosemide (LASIX) 20 MG tablet Take 20mg  (1 tablet) once daily for 7 days then as needed for edema or weight gain. 01/20/20   Strader, Fransisco Hertz, PA-C  hydrALAZINE (APRESOLINE) 25 MG tablet Take 1 tablet (25 mg total) by mouth every 8 (eight) hours. 05/17/18   [name], PA-C  metoprolol tartrate (LOPRESSOR) 50 MG tablet TAKE 1 TABLET(50 MG) BY MOUTH TWICE DAILY 10/15/20   Satira Sark, MD  pantoprazole (PROTONIX) 40 MG tablet Take 1 tablet by mouth daily. 02/07/20   [provider]  sitaGLIPtan-metformin (JANUMET) 50-1000 MG per tablet Take 1 tablet by mouth 2 (two) times daily with a meal.      [provider]    Allergies    Patient has no known allergies.  Review of Systems   Review of Systems  Constitutional:  Negative for appetite change and fatigue.  HENT:  Negative for congestion, ear discharge and sinus pressure.   Eyes:  Negative for discharge.  Respiratory:  Negative for cough.   Cardiovascular:  Negative for chest pain.  Gastrointestinal:  Negative for abdominal pain and diarrhea.  Genitourinary:  Negative for frequency and hematuria.  Musculoskeletal:  Negative for back pain.  Skin:  Negative for rash.  Neurological:  Positive for dizziness. Negative for seizures and headaches.  Psychiatric/Behavioral:  Negative for hallucinations.    Physical Exam Updated Vital Signs BP (!) 141/60   Pulse 74   Temp (!) 94.8 F (34.9 C) (Rectal)   Resp 10   Ht 5\' 11"  (1.803 m)   Wt 112.9 kg   SpO2 95%   BMI 34.73 kg/m   Physical Exam Vitals and nursing note reviewed.  Constitutional:      Appearance: He is well-developed.     Comments: Lethargic  HENT:     Head: Normocephalic.     Nose: Nose normal.  Eyes:     General: No scleral icterus.    Conjunctiva/sclera: Conjunctivae normal.   Neck:     Thyroid: No thyromegaly.  Cardiovascular:     Rate and Rhythm: Normal rate and regular rhythm.     Heart sounds: No murmur heard.   No friction rub. No gallop.  Pulmonary:     Breath sounds: No stridor. No wheezing or rales.  Chest:     Chest wall: No tenderness.  Abdominal:     General: There is no distension.     Tenderness: There is no abdominal tenderness. There is no rebound.  Musculoskeletal:        General: Normal range of motion.     Cervical back: Neck supple.  Lymphadenopathy:     Cervical: No cervical adenopathy.  Skin:    Findings: No erythema or rash.  Neurological:     Mental Status: He is oriented to person, place, and time.     Motor: No abnormal muscle tone.     Coordination: Coordination normal.  Psychiatric:        Behavior: Behavior normal.    ED Results / Procedures / Treatments   Labs (all labs ordered are listed, but only abnormal results are displayed) Labs Reviewed  BASIC METABOLIC PANEL - Abnormal; Notable for the following components:      Result Value   Potassium 3.1 (*)    Glucose, Bld 242 (*)    All other components within normal limits  CBC - Abnormal; Notable for the following components:   WBC 14.2 (*)    All other components within normal limits  LACTIC ACID, PLASMA - Abnormal; Notable for the following components:   Lactic Acid, Venous 2.0 (*)    All other components within normal limits  CBG MONITORING, ED - Abnormal; Notable for the following components:   Glucose-Capillary 222 (*)    All other components within normal limits  CBG MONITORING, ED - Abnormal; Notable for the following components:   Glucose-Capillary 215 (*)    All other components within normal limits  CULTURE, BLOOD (ROUTINE X 2)  CULTURE, BLOOD (ROUTINE X 2)  RESP PANEL BY RT-PCR (FLU A&B, COVID) ARPGX2  PROTIME-INR  APTT  URINALYSIS, ROUTINE W REFLEX MICROSCOPIC  LACTIC ACID, PLASMA  TROPONIN I (HIGH SENSITIVITY)  TROPONIN I (HIGH SENSITIVITY)     EKG None  Radiology DG Chest Port 1 View  Result Date: 07/11/2021 CLINICAL DATA:  Questionable sepsis EXAM: PORTABLE CHEST 1 VIEW COMPARISON:  Chest radiograph 01/03/2020 FINDINGS: Median sternotomy wires and mediastinal surgical clips are again noted. The heart is enlarged, not significantly changed. The mediastinal contours are otherwise within normal limits. There is vascular congestion without frank pulmonary edema. There is no focal consolidation. There is no significant pleural effusion. There is no appreciable pneumothorax. The bones are unremarkable. IMPRESSION: Cardiomegaly and  vascular congestion without frank pulmonary edema. Electronically Signed   By: Valetta Mole MD   On: 07/11/2021 14:46    Procedures Procedures   Medications Ordered in ED Medications  lactated ringers infusion ( Intravenous New Bag/Given 07/11/21 1524)  vancomycin (VANCOREADY) IVPB 2000 mg/400 mL (2,000 mg Intravenous New Bag/Given 07/11/21 1525)  ceFEPIme (MAXIPIME) 2 g in sodium chloride 0.9 % 100 mL IVPB (has no administration in time range)  vancomycin (VANCOCIN) IVPB 1000 mg/200 mL premix (has no administration in time range)  potassium chloride 10 mEq in 100 mL IVPB (has no administration in time range)  potassium chloride 10 mEq in 100 mL IVPB (has no administration in time range)  lactated ringers bolus 1,000 mL (1,000 mLs Intravenous New Bag/Given 07/11/21 1436)    And  lactated ringers bolus 1,000 mL (1,000 mLs Intravenous New Bag/Given 07/11/21 1503)  ceFEPIme (MAXIPIME) 2 g in sodium chloride 0.9 % 100 mL IVPB (2 g Intravenous New Bag/Given 07/11/21 1434)  metroNIDAZOLE (FLAGYL) IVPB 500 mg (500 mg Intravenous New Bag/Given 07/11/21 1454)    ED Course  I have reviewed the triage vital signs and the nursing notes.  Pertinent labs & imaging results that were available during my care of the patient were reviewed by me and considered in my medical decision making (see chart for  details). CRITICAL CARE Performed by: Milton Ferguson Total critical care time: 40 minutes Critical care time was exclusive of  separately billable procedures and treating other patients. Critical care was necessary to treat or prevent imminent or life-threatening deterioration. Critical care was time spent personally by me on the following activities: development of treatment plan with patient and/or surrogate as well as nursing, discussions with consultants, evaluation of patient's response to treatment, examination of patient, obtaining history from patient or surrogate, ordering and performing treatments and interventions, ordering and review of laboratory studies, ordering and review of radiographic studies, pulse oximetry and re-evaluation of patient's condition.    MDM Rules/Calculators/A&P                           Patient will be admitted for hypothermia and possible sepsis Final Clinical Impression(s) / ED Diagnoses Final diagnoses:  None    Rx / DC Orders ED Discharge Orders     None        Milton Ferguson, MD 07/14/21 [phone]

## 2021-07-11 NOTE — ED Notes (Deleted)
Alejandro [name] hugger off per patient's request.

## 2021-07-12 DIAGNOSIS — R6510 Systemic inflammatory response syndrome (SIRS) of non-infectious origin without acute organ dysfunction: Secondary | ICD-10-CM

## 2021-07-12 LAB — CBG MONITORING, ED
Glucose-Capillary: 162 mg/dL — ABNORMAL HIGH (ref 70–99)
Glucose-Capillary: 188 mg/dL — ABNORMAL HIGH (ref 70–99)
Glucose-Capillary: 132 mg/dL — ABNORMAL HIGH (ref 70–99)
Glucose-Capillary: 162 mg/dL — ABNORMAL HIGH (ref 70–99)

## 2021-07-12 LAB — CBC
HCT: 36.5 % — ABNORMAL LOW (ref 39.0–52.0)
Hemoglobin: 12.2 g/dL — ABNORMAL LOW (ref 13.0–17.0)
MCH: 30.3 pg (ref 26.0–34.0)
MCHC: 33.4 g/dL (ref 30.0–36.0)
MCV: 90.8 fL (ref 80.0–100.0)
Platelets: 180 10*3/uL (ref 150–400)
RBC: 4.02 MIL/uL — ABNORMAL LOW (ref 4.22–5.81)
RDW: 13.8 % (ref 11.5–15.5)
WBC: 10.2 10*3/uL (ref 4.0–10.5)
nRBC: 0 % (ref 0.0–0.2)

## 2021-07-12 LAB — HEMOGLOBIN A1C
Hgb A1c MFr Bld: 8.3 % — ABNORMAL HIGH (ref 4.8–5.6)
Mean Plasma Glucose: 191.51 mg/dL

## 2021-07-12 LAB — PROCALCITONIN: Procalcitonin: <0.1 ng/mL

## 2021-07-12 LAB — BASIC METABOLIC PANEL
Anion gap: 9 (ref 5–15)
BUN: 18 mg/dL (ref 8–23)
CO2: 21 mmol/L — ABNORMAL LOW (ref 22–32)
Calcium: 8.3 mg/dL — ABNORMAL LOW (ref 8.9–10.3)
Chloride: 103 mmol/L (ref 98–111)
Creatinine, Ser: 0.95 mg/dL (ref 0.61–1.24)
GFR, Estimated: >60 mL/min (ref >60)
Glucose, Bld: 248 mg/dL — ABNORMAL HIGH (ref 70–99)
Potassium: 3.6 mmol/L (ref 3.5–5.1)
Sodium: 133 mmol/L — ABNORMAL LOW (ref 135–145)

## 2021-07-12 MED ORDER — MELATONIN 3 MG PO TABS
9.0000 mg | ORAL_TABLET | Freq: Every day | ORAL | Status: DC
  Administered 2021-07-12 – 2021-07-13 (×2): 9 mg via ORAL
  Filled 2021-07-12 (×2): qty 3

## 2021-07-12 MED ORDER — ENOXAPARIN SODIUM 60 MG/0.6ML IJ SOSY
55.0000 mg | PREFILLED_SYRINGE | INTRAMUSCULAR | Status: DC
  Administered 2021-07-12 – 2021-07-13 (×2): 55 mg via SUBCUTANEOUS
  Filled 2021-07-12 (×3): qty 0.6

## 2021-07-12 NOTE — Progress Notes (Signed)
PROGRESS NOTE    Patient: Alejandro [name]                            PCP: Celene Squibb, MD                    DOB: June 03, 1941            DOA: 07/11/2021 JAS:[id number]             DOS: 07/12/2021, 12:04 PM   LOS: 1 day   Date of Service: The patient was seen and examined on 07/12/2021  Subjective:   The patient was seen and examined this morning. Stable at this time. Still complaining of :  Otherwise no issues overnight .  Brief Narrative:   Alejandro [name] is a 80 y.o. male with medical history significant for hypertension, diabetes mellitus, diastolic CHF, CABG. patient presented lethargic voice is hoarse, but he is answering questions appropriately,  Coworker noticed patient's was confused, and almost fell. Report 1 episode of vomiting.    ED Course: Temperature 94.8.  Heart rate heart rate in the 40s initially lowest of 41, currently improved 57.  Blood pressure 120s to 150s.  O2 sats greater than 94% on room air.  Chest x-ray shows cardiomegaly and vascular congestion without frank pulmonary edema.  WBC 14.2.  Lactic acid 2.  Troponin 7.  Low Potassium 3.1.  Magnesium 1.8. EKG showed junctional rhythm with right bundle branch block QTC 638, ED provider discussed EKG with Dr. Olga Millers, felt EKG changes were due to metabolic derangements, infection. Broad-spectrum antibiotics Vanco, metronidazole.  2 L LR bolus given.  Hospitalist to admit.    Assessment & Plan:   Principal Problem:   SIRS (systemic inflammatory response syndrome) (HCC) Active Problems:   Hypertension   Diabetes mellitus (HCC)   S/P CABG x 4   Acute metabolic encephalopathy     SIRS-hypothermia -Hemodynamically improved, stable this morning -temp 97.7, PR 57, RR 19, BP 130/65, satting 96% on room air -On admission temp down to 94.8, with bradycardia, leukocytosis of 14.2.  Lactic acid 2  >> 1.7.   UA not suggestive of infection, chest x-ray shows vascular congestion without frank edema.   COVID test negative.   Reported prolonged exposure outside, but CK normal, and patient hypothermic. -Follow-up blood and urine cultures:  -Lactic acid and procalcitonin level normal -2 L bolus given, with maintenance fluids in ED at 150cc rate, he has gotten about 2.750 L of fluid. -Hold further IV fluids for now -Continue broad-spectrum antibiotic vancomycin and cefepime, discontinued metronidazole If cultures remain negative in the next 12 hours discontinuing IV antibiotics   Metabolic encephalopathy -likely due to dehydration -confusion has improved with hydration in the ED. Likely from dehydration, SIRS. -Head CT deferred for now with improvement in mental status. -Mentation back to baseline   Hypokalemia -potassium 3.1, 3.6  Magnesium 1.8. -Monitoring and replete   Abnormal EKG, CABG x4 -EKG different from prior, new wide QRS complexes.  Denies chest pain.  Troponin 7 > 8.  Last echo 2021, EF 45% with grade 3 diastolic dysfunction (restrictive), mild aortic stenosis - EDP discussed EKG with Dr. Olga Millers, felt changes secondary to hypothermia, metabolic derangements, possible infection -Repeat EKG I--stable -Resume atorvastatin, aspirin   HTN-stable -Hold HCTZ for now with hydration. -Resume lisinopril, metoprolol,    Diastolic CHF -stable and compensated. Last echo 2021, EF 45% with grade 3 diastolic dysfunction (restrictive),  Last echo 2021, mild aortic stenosis. -Hold home 20mg  Lasix.Marland KitchenMarland Kitchen Anticipating to resume in 1 to 2 days   Diabetes mellitus,  -CBG 215, 179, 162 -We will initiate CBG q. ACH S, SSI coverage -Hold home Jardiance, metformin - Hgba1c      ----------------------------------------------------------------------------------------------------------------------------------------------- Nutritional status:  The patient's BMI is: Body mass index is 34.73 kg/m. I agree with the assessment and plan as outlined    ------------------------------------------------------------------------------------------------------------------------------------------------ Cultures; Blood Cultures x 2 >>  Urine Culture  >>>    Antimicrobials: Cefepime/vancomycin Discontinue Flagyl   Consultants: None   ------------------------------------------------------------------------------------------------------------------------------------------------  DVT prophylaxis:  SCD/Compression stockings Code Status:   Code Status: Full Code  Family Communication:  Alert oriented x 3 ... Discussed with patient in detail The above findings and plan of care has been discussed with patient (and family)  in detail,  they expressed understanding and agreement of above. -Advance care planning has been discussed.   Admission status:   Status is: Inpatient  Remains inpatient appropriate because:Inpatient level of care appropriate due to severity of illness  Dispo: The patient is from: Home              Anticipated d/c is to: Home              Patient currently is not medically stable to d/c.   Difficult to place patient No      Level of care: Stepdown   Procedures:   No admission procedures for hospital encounter.    Antimicrobials:  Anti-infectives (From admission, onward)    Start     Dose/Rate Route Frequency Ordered Stop   07/12/21 0400  vancomycin (VANCOCIN) IVPB 1000 mg/200 mL premix        1,000 mg 200 mL/hr over 60 Minutes Intravenous Every 12 hours 07/11/21 1446     07/12/21 0000  metroNIDAZOLE (FLAGYL) IVPB 500 mg        500 mg 100 mL/hr over 60 Minutes Intravenous Every 8 hours 07/11/21 2111 07/12/21 0228   07/11/21 2200  ceFEPIme (MAXIPIME) 2 g in sodium chloride 0.9 % 100 mL IVPB        2 g 200 mL/hr over 30 Minutes Intravenous Every 8 hours 07/11/21 1446     07/11/21 1500  vancomycin (VANCOREADY) IVPB 2000 mg/400 mL        2,000 mg 200 mL/hr over 120 Minutes Intravenous  Once 07/11/21  1411 07/11/21 1735   07/11/21 1415  ceFEPIme (MAXIPIME) 2 g in sodium chloride 0.9 % 100 mL IVPB        2 g 200 mL/hr over 30 Minutes Intravenous  Once 07/11/21 1406 07/11/21 1610   07/11/21 1415  metroNIDAZOLE (FLAGYL) IVPB 500 mg        500 mg 100 mL/hr over 60 Minutes Intravenous  Once 07/11/21 1406 07/11/21 1610   07/11/21 1415  vancomycin (VANCOCIN) IVPB 1000 mg/200 mL premix  Status:  Discontinued        1,000 mg 200 mL/hr over 60 Minutes Intravenous  Once 07/11/21 1406 07/11/21 1411        Medication:   aspirin EC  81 mg Oral Daily   atorvastatin  80 mg Oral Daily   enoxaparin (LOVENOX) injection  55 mg Subcutaneous Q24H   insulin aspart  0-15 Units Subcutaneous TID WC   insulin aspart  0-5 Units Subcutaneous QHS   lisinopril  5 mg Oral Daily   melatonin  9 mg Oral QHS   metoprolol tartrate  50 mg Oral BID  pantoprazole  40 mg Oral Daily    acetaminophen **OR** acetaminophen, ALPRAZolam, ondansetron **OR** ondansetron (ZOFRAN) IV, polyethylene glycol   Objective:   Vitals:   07/12/21 0600 07/12/21 0630 07/12/21 0808 07/12/21 0910  BP: 136/63 (!) 157/70 130/65 130/65  Pulse: (!) 52 (!) 53 (!) 49 (!) 57  Resp: 12 11 10 19   Temp:      TempSrc:      SpO2: 97% 95% 97% 96%  Weight:      Height:        Intake/Output Summary (Last 24 hours) at 07/12/2021 1204 Last data filed at 07/12/2021 5916 Gross per 24 hour  Intake 4552.32 ml  Output 100 ml  Net 4452.32 ml   Filed Weights   07/11/21 1342  Weight: 112.9 kg     Examination:   Physical Exam  Constitution:  Alert, cooperative, no distress,  Appears calm and comfortable  Psychiatric: Normal and stable mood and affect, cognition intact,   HEENT: Normocephalic, PERRL, otherwise with in Normal limits  Chest:Chest symmetric Cardio vascular:  S1/S2, RRR, No murmure, No Rubs or Gallops  pulmonary: Clear to auscultation bilaterally, respirations unlabored, negative wheezes / crackles Abdomen: Soft, non-tender,  non-distended, bowel sounds,no masses, no organomegaly Muscular skeletal: Limited exam - in bed, able to move all 4 extremities, Normal strength,  Neuro: CNII-XII intact. , normal motor and sensation, reflexes intact  Extremities: No pitting edema lower extremities, +2 pulses  Skin: Dry, warm to touch, negative for any Rashes, No open wounds Wounds: per nursing documentation    ------------------------------------------------------------------------------------------------------------------------------------------    LABs:  CBC Latest Ref Rng & Units 07/12/2021 07/11/2021 06/11/2018  WBC 4.0 - 10.5 K/uL 10.2 14.2(H) 11.6(H)  Hemoglobin 13.0 - 17.0 g/dL 12.2(L) 14.4 12.9(L)  Hematocrit 39.0 - 52.0 % 36.5(L) 42.7 38.7(L)  Platelets 150 - 400 K/uL 180 218 254   CMP Latest Ref Rng & Units 07/12/2021 07/11/2021 01/27/2020  Glucose 70 - 99 mg/dL 248(H) 242(H) 154(H)  BUN 8 - 23 mg/dL 18 21 23   Creatinine 0.61 - 1.24 mg/dL 0.95 1.08 1.21  Sodium 135 - 145 mmol/L 133(L) 135 135  Potassium 3.5 - 5.1 mmol/L 3.6 3.1(L) 4.4  Chloride 98 - 111 mmol/L 103 103 104  CO2 22 - 32 mmol/L 21(L) 22 23  Calcium 8.9 - 10.3 mg/dL 8.3(L) 9.0 8.6(L)  Total Protein 6.5 - 8.1 g/dL - - -  Total Bilirubin 0.3 - 1.2 mg/dL - - -  Alkaline Phos 38 - 126 U/L - - -  AST 15 - 41 U/L - - -  ALT 17 - 63 U/L - - -       Micro Results Recent Results (from the past 240 hour(s))  Blood Culture (routine x 2)     Status: None (Preliminary result)   Collection Time: 07/11/21  2:34 PM   Specimen: BLOOD  Result Value Ref Range Status   Specimen Description BLOOD RIGHT ANTECUBITAL  Final   Special Requests   Final    BOTTLES DRAWN AEROBIC AND ANAEROBIC Blood Culture adequate volume   Culture   Final    NO GROWTH < 24 HOURS Performed at [facility], [address] [place], [place] [address]    Report Status PENDING  Incomplete  Blood Culture (routine x 2)     Status: None (Preliminary result)   Collection Time:  07/11/21  2:34 PM   Specimen: BLOOD  Result Value Ref Range Status   Specimen Description BLOOD RIGHT WRIST  Final  Special Requests   Final    BOTTLES DRAWN AEROBIC AND ANAEROBIC Blood Culture adequate volume   Culture   Final    NO GROWTH < 24 HOURS Performed at [facility], [address] [place], [place] [address]    Report Status PENDING  Incomplete  Resp Panel by RT-PCR (Flu A&B, Covid) Nasopharyngeal Swab     Status: None   Collection Time: 07/11/21  6:00 PM   Specimen: Nasopharyngeal Swab; Nasopharyngeal(NP) swabs in vial transport medium  Result Value Ref Range Status   SARS Coronavirus 2 by RT PCR NEGATIVE NEGATIVE Final    Comment: (NOTE) SARS-CoV-2 target nucleic acids are NOT DETECTED.  The SARS-CoV-2 RNA is generally detectable in upper respiratory specimens during the acute phase of infection. The lowest concentration of SARS-CoV-2 viral copies this assay can detect is 138 copies/mL. A negative result does not preclude SARS-Cov-2 infection and should not be used as the sole basis for treatment or other patient management decisions. A negative result may occur with  improper specimen collection/handling, submission of specimen other than nasopharyngeal swab, presence of viral mutation(s) within the areas targeted by this assay, and inadequate number of viral copies(<138 copies/mL). A negative result must be combined with clinical observations, patient history, and epidemiological information. The expected result is Negative.  Fact Sheet for Patients:  EntrepreneurPulse.com.au  Fact Sheet for Healthcare Providers:  IncredibleEmployment.be  This test is no t yet approved or cleared by the [place] FDA and  has been authorized for detection and/or diagnosis of SARS-CoV-2 by FDA under an Emergency Use Authorization (EUA). This EUA will remain  in effect (meaning this test can be used) for the duration of the COVID-19  declaration under Section 564(b)(1) of the Act, 21 U.S.C.section 360bbb-3(b)(1), unless the authorization is terminated  or revoked sooner.       Influenza A by PCR NEGATIVE NEGATIVE Final   Influenza B by PCR NEGATIVE NEGATIVE Final    Comment: (NOTE) The Xpert Xpress SARS-CoV-2/FLU/RSV plus assay is intended as an aid in the diagnosis of influenza from Nasopharyngeal swab specimens and should not be used as a sole basis for treatment. Nasal washings and aspirates are unacceptable for Xpert Xpress SARS-CoV-2/FLU/RSV testing.  Fact Sheet for Patients: EntrepreneurPulse.com.au  Fact Sheet for Healthcare Providers: IncredibleEmployment.be  This test is not yet approved or cleared by the [place] FDA and has been authorized for detection and/or diagnosis of SARS-CoV-2 by FDA under an Emergency Use Authorization (EUA). This EUA will remain in effect (meaning this test can be used) for the duration of the COVID-19 declaration under Section 564(b)(1) of the Act, 21 U.S.C. section 360bbb-3(b)(1), unless the authorization is terminated or revoked.  Performed at [facility], [address], [place], [place] [address]     Radiology Reports DG Chest [place] 1 View  Result Date: 07/11/2021 CLINICAL DATA:  Questionable sepsis EXAM: PORTABLE CHEST 1 VIEW COMPARISON:  Chest radiograph 01/03/2020 FINDINGS: Median sternotomy wires and mediastinal surgical clips are again noted. The heart is enlarged, not significantly changed. The mediastinal contours are otherwise within normal limits. There is vascular congestion without frank pulmonary edema. There is no focal consolidation. There is no significant pleural effusion. There is no appreciable pneumothorax. The bones are unremarkable. IMPRESSION: Cardiomegaly and  vascular congestion without frank pulmonary edema. Electronically Signed   By: Valetta Mole MD   On: 07/11/2021 14:46    SIGNED: Deatra James,  MD, FHM. [facility],  Pager (please use [email or web address] to page/text)  Please use Epic Secure Chat for non-urgent communication (7AM-7PM)  If 7PM-7AM, please contact night-coverage [email or web address], 07/12/2021, 12:04 PM

## 2021-07-12 NOTE — ED Notes (Signed)
Came to check on the patient.  Patients IV catheter completely pulled out and site bleeding.  Controlled bleeding and cleaned the patient up.  Full change of linens and gown.  Patient positioned back in bed.

## 2021-07-12 NOTE — ED Notes (Signed)
Patient assisted to restroom via wheelchair with standby assistance. Patient states that he has a headache at this time, asking for medication.

## 2021-07-12 NOTE — ED Notes (Signed)
Took pt to bathroom via wheelchair

## 2021-07-13 ENCOUNTER — Encounter (HOSPITAL_COMMUNITY): Payer: Self-pay | Admitting: Internal Medicine

## 2021-07-13 ENCOUNTER — Other Ambulatory Visit: Payer: Self-pay

## 2021-07-13 DIAGNOSIS — R6510 Systemic inflammatory response syndrome (SIRS) of non-infectious origin without acute organ dysfunction: Secondary | ICD-10-CM

## 2021-07-13 DIAGNOSIS — E1165 Type 2 diabetes mellitus with hyperglycemia: Secondary | ICD-10-CM

## 2021-07-13 DIAGNOSIS — Z951 Presence of aortocoronary bypass graft: Secondary | ICD-10-CM

## 2021-07-13 DIAGNOSIS — E669 Obesity, unspecified: Secondary | ICD-10-CM

## 2021-07-13 DIAGNOSIS — I5042 Chronic combined systolic (congestive) and diastolic (congestive) heart failure: Secondary | ICD-10-CM

## 2021-07-13 DIAGNOSIS — R9431 Abnormal electrocardiogram [ECG] [EKG]: Secondary | ICD-10-CM

## 2021-07-13 DIAGNOSIS — I5030 Unspecified diastolic (congestive) heart failure: Secondary | ICD-10-CM

## 2021-07-13 DIAGNOSIS — F419 Anxiety disorder, unspecified: Secondary | ICD-10-CM

## 2021-07-13 DIAGNOSIS — G9341 Metabolic encephalopathy: Secondary | ICD-10-CM

## 2021-07-13 DIAGNOSIS — Z6832 Body mass index (BMI) 32.0-32.9, adult: Secondary | ICD-10-CM

## 2021-07-13 LAB — COMPREHENSIVE METABOLIC PANEL
ALT: 17 U/L (ref 0–44)
AST: 23 U/L (ref 15–41)
Albumin: 3.6 g/dL (ref 3.5–5.0)
Alkaline Phosphatase: 74 U/L (ref 38–126)
Anion gap: 3 — ABNORMAL LOW (ref 5–15)
BUN: 14 mg/dL (ref 8–23)
CO2: 24 mmol/L (ref 22–32)
Calcium: 8.5 mg/dL — ABNORMAL LOW (ref 8.9–10.3)
Chloride: 108 mmol/L (ref 98–111)
Creatinine, Ser: 0.94 mg/dL (ref 0.61–1.24)
GFR, Estimated: >60 mL/min (ref >60)
Glucose, Bld: 150 mg/dL — ABNORMAL HIGH (ref 70–99)
Potassium: 3.4 mmol/L — ABNORMAL LOW (ref 3.5–5.1)
Sodium: 135 mmol/L (ref 135–145)
Total Bilirubin: 0.9 mg/dL (ref 0.3–1.2)
Total Protein: 6.8 g/dL (ref 6.5–8.1)

## 2021-07-13 LAB — GLUCOSE, CAPILLARY
Glucose-Capillary: 171 mg/dL — ABNORMAL HIGH (ref 70–99)
Glucose-Capillary: 172 mg/dL — ABNORMAL HIGH (ref 70–99)
Glucose-Capillary: 171 mg/dL — ABNORMAL HIGH (ref 70–99)
Glucose-Capillary: 134 mg/dL — ABNORMAL HIGH (ref 70–99)
Glucose-Capillary: 160 mg/dL — ABNORMAL HIGH (ref 70–99)

## 2021-07-13 LAB — PROCALCITONIN: Procalcitonin: <0.1 ng/mL

## 2021-07-13 LAB — CBC
HCT: 40.1 % (ref 39.0–52.0)
Hemoglobin: 13.6 g/dL (ref 13.0–17.0)
MCH: 30.7 pg (ref 26.0–34.0)
MCHC: 33.9 g/dL (ref 30.0–36.0)
MCV: 90.5 fL (ref 80.0–100.0)
Platelets: 189 10*3/uL (ref 150–400)
RBC: 4.43 MIL/uL (ref 4.22–5.81)
RDW: 14.2 % (ref 11.5–15.5)
WBC: 9.9 10*3/uL (ref 4.0–10.5)
nRBC: 0 % (ref 0.0–0.2)

## 2021-07-13 LAB — MAGNESIUM: Magnesium: 1.9 mg/dL (ref 1.7–2.4)

## 2021-07-13 LAB — URINE CULTURE: Culture: NO GROWTH

## 2021-07-13 LAB — RAPID URINE DRUG SCREEN, HOSP PERFORMED
Amphetamines: NOT DETECTED
Barbiturates: NOT DETECTED
Benzodiazepines: NOT DETECTED
Cocaine: NOT DETECTED
Opiates: NOT DETECTED
Tetrahydrocannabinol: NOT DETECTED

## 2021-07-13 LAB — AMMONIA: Ammonia: 14 umol/L (ref 9–35)

## 2021-07-13 LAB — FOLATE: Folate: 8.7 ng/mL (ref >5.9)

## 2021-07-13 LAB — CORTISOL-AM, BLOOD: Cortisol - AM: 15.5 ug/dL (ref 6.7–22.6)

## 2021-07-13 LAB — MRSA NEXT GEN BY PCR, NASAL: MRSA by PCR Next Gen: NOT DETECTED

## 2021-07-13 LAB — TSH: TSH: 1.399 u[IU]/mL (ref 0.350–4.500)

## 2021-07-13 LAB — VITAMIN B12: Vitamin B-12: 61 pg/mL — ABNORMAL LOW (ref 180–914)

## 2021-07-13 MED ORDER — HYDRALAZINE HCL 25 MG PO TABS
25.0000 mg | ORAL_TABLET | Freq: Three times a day (TID) | ORAL | Status: DC
  Administered 2021-07-13 – 2021-07-14 (×4): 25 mg via ORAL
  Filled 2021-07-13 (×4): qty 1

## 2021-07-13 MED ORDER — POLYVINYL ALCOHOL 1.4 % OP SOLN
1.0000 [drp] | OPHTHALMIC | Status: DC | PRN
  Filled 2021-07-13: qty 15

## 2021-07-13 MED ORDER — CHLORHEXIDINE GLUCONATE CLOTH 2 % EX PADS
6.0000 | MEDICATED_PAD | Freq: Every day | CUTANEOUS | Status: DC
  Administered 2021-07-13: 6 via TOPICAL

## 2021-07-13 NOTE — Progress Notes (Signed)
PROGRESS NOTE  Alejandro [name] JSE:[id number] DOB: [birth year] DOA: 07/11/2021 PCP: Celene Squibb, MD  Brief History:  80 year old male with a history of systolic and diastolic CHF, diabetes mellitus type 2, hypertension, coronary artery disease, postoperative atrial fibrillation, hyperlipidemia presenting with dizziness and gait instability.  The patient works in [employer] and works outside on a daily basis.  Apparently, the patient was noted to have some gait instability and altered mental status on 07/11/2021.  His grandson took him home, and the patient went to take a nap.  Apparently, the patient continued to have some dizziness and gait instability.  He had an episode of diaphoresis when he woke up.  As result, the patient was brought to the hospital for further evaluation.  There was concern initially that the patient had not been hydrating well.  The patient did have 2 episodes of nausea and vomiting on the day of admission.  In the emergency department, the patient had SIRS criteria with a temperature of 94.8 F, heart rate in the 40s, WBC 14.2, and lactate 2.0.  Patient was admitted for further evaluation and treatment.  He was started on intravenous antibiotics and IV fluids.  Since then, his mental status has gradually improved.  His vital signs have stabilized.  Assessment/Plan:  SIRS -Present on admission -Sepsis ruled out -Presented with hypothermia and leukocytosis with lactate 2.0 -Physiologically improved -Blood cultures remain negative -UA negative for pyuria -Chest x-ray without any infiltrates -Discontinue antibiotics and observe clinically -Lactic acid 2.0>>1.7 -PCT <0.10 x 3  Acute metabolic encephalopathy -Mental status has improved with hydration and empiric antibiotics -Oral intake has improved -Discontinue IV fluids and IV antibiotics and observe clinically -Serum B12 -TSH  Chronic systolic and diastolic CHF -Clinically  euvolemic -01/11/2020 echo EF 45%, grade 3 DD, moderate MR, mild AS -Continue aspirin, metoprolol, Lipitor  Coronary artery disease status post CABG -No chest pain presently -Continue aspirin and Lipitor -Continue metoprolol tartrate  Lactic acidosis -Sepsis ruled out  Abnormal EKG -Patient presented with junctional rhythm and RBBB -Improved after electrolyte abnormalities and metabolic abnormalities corrected -07/12/2021 EKG--personally reviewed sinus rhythm, nonspecific T wave changes  Essential hypertension -Continue metoprolol tartrate -Restart hydralazine -Continue lisinopril -Holding HCTZ  Uncontrolled diabetes mellitus type 2 with hyperglycemia -07/12/2021 hemoglobin A1c 8.3 -Holding metformin and Januvia -Continue NovoLog sliding scale  Anxiety -His benzodiazepines were held in the setting of his altered mental status at the time of admission  Class I obesity -BMI 32.56 -Lifestyle modification      Status is: Inpatient  Remains inpatient appropriate because:Inpatient level of care appropriate due to severity of illness  Dispo: The patient is from: Home              Anticipated d/c is to: Home              Patient currently is not medically stable to d/c.   Difficult to place patient No        Family Communication:   no Family at bedside  Consultants:  none  Code Status:  FULL   DVT Prophylaxis:  [place] Lovenox   Procedures: As Listed in Progress Note Above  Antibiotics: Vanc 8/11>>8/13 Cefepime 8/11>>8/13      Subjective: Patient denies fevers, chills, headache, chest pain, dyspnea, nausea, vomiting, diarrhea, abdominal pain, dysuria, hematuria, hematochezia, and melena.   Objective: Vitals:   07/13/21 0500 07/13/21 0600 07/13/21 0700 07/13/21 0739  BP: (!) 172/56  Marland Kitchen)  165/71   Pulse: (!) 57 (!) 59 (!) 54 (!) 58  Resp: 12 (!) 21 12 11   Temp:    97.9 F (36.6 C)  TempSrc:    Oral  SpO2: 100% 100% 96% 96%  Weight:      Height:         Intake/Output Summary (Last 24 hours) at 07/13/2021 0823 Last data filed at 07/13/2021 0700 Gross per 24 hour  Intake 749.48 ml  Output --  Net 749.48 ml   Weight change: -7.046 kg Exam:  General:  Pt is alert, follows commands appropriately, not in acute distress HEENT: No icterus, No thrush, No neck mass, [place]/AT Cardiovascular: RRR, S1/S2, no rubs, no gallops Respiratory: CTA bilaterally, no wheezing, no crackles, no rhonchi Abdomen: Soft/+BS, non tender, non distended, no guarding Extremities: No edema, No lymphangitis, No petechiae, No rashes, no synovitis   Data Reviewed: I have personally reviewed following labs and imaging studies Basic Metabolic Panel: Recent Labs  Lab 07/11/21 1337 07/11/21 1655 07/12/21 0528  NA 135  --  133*  K 3.1*  --  3.6  CL 103  --  103  CO2 22  --  21*  GLUCOSE 242*  --  248*  BUN 21  --  18  CREATININE 1.08  --  0.95  CALCIUM 9.0  --  8.3*  MG  --  1.8  --    Liver Function Tests: No results for input(s): AST, ALT, ALKPHOS, BILITOT, PROT, ALBUMIN in the last 168 hours. No results for input(s): LIPASE, AMYLASE in the last 168 hours. No results for input(s): AMMONIA in the last 168 hours. Coagulation Profile: Recent Labs  Lab 07/11/21 1434  INR 1.0   CBC: Recent Labs  Lab 07/11/21 1337 07/12/21 0528  WBC 14.2* 10.2  HGB 14.4 12.2*  HCT 42.7 36.5*  MCV 89.9 90.8  PLT 218 180   Cardiac Enzymes: Recent Labs  Lab 07/11/21 1655  CKTOTAL 102   BNP: Invalid input(s): POCBNP CBG: Recent Labs  Lab 07/12/21 0828 07/12/21 1301 07/12/21 1757 07/12/21 2218 07/13/21 0738  GLUCAP 162* 188* 132* 162* 171*   HbA1C: Recent Labs    07/12/21 0528  HGBA1C 8.3*   Urine analysis:    Component Value Date/Time   COLORURINE STRAW (A) 07/11/2021 1530   APPEARANCEUR CLEAR 07/11/2021 1530   LABSPEC 1.025 07/11/2021 1530   PHURINE 5.0 07/11/2021 1530   GLUCOSEU >=500 (A) 07/11/2021 1530   HGBUR NEGATIVE 07/11/2021 [place] 07/11/2021 1530   KETONESUR 5 (A) 07/11/2021 1530   PROTEINUR NEGATIVE 07/11/2021 1530   NITRITE NEGATIVE 07/11/2021 1530   LEUKOCYTESUR NEGATIVE 07/11/2021 1530   Sepsis Labs: @LABRCNTIP (procalcitonin:4,lacticidven:4) ) Recent Results (from the past 240 hour(s))  Blood Culture (routine x 2)     Status: None (Preliminary result)   Collection Time: 07/11/21  2:34 PM   Specimen: BLOOD  Result Value Ref Range Status   Specimen Description BLOOD RIGHT ANTECUBITAL  Final   Special Requests   Final    BOTTLES DRAWN AEROBIC AND ANAEROBIC Blood Culture adequate volume   Culture   Final    NO GROWTH 2 DAYS Performed at [facility], [address] [place], [place] [address]    Report Status PENDING  Incomplete  Blood Culture (routine x 2)     Status: None (Preliminary result)   Collection Time: 07/11/21  2:34 PM   Specimen: BLOOD  Result Value Ref Range Status   Specimen Description BLOOD RIGHT  WRIST  Final   Special Requests   Final    BOTTLES DRAWN AEROBIC AND ANAEROBIC Blood Culture adequate volume   Culture   Final    NO GROWTH 2 DAYS Performed at [facility], [address], [place], [place] [address]    Report Status PENDING  Incomplete  Urine Culture     Status: None   Collection Time: 07/11/21  3:30 PM   Specimen: Urine, Random  Result Value Ref Range Status   Specimen Description   Final    URINE, RANDOM Performed at [facility], [address] [place], [place] [address]    Special Requests   Final    URINE, RANDOM Performed at [facility], [address] [place], [place] [address]    Culture   Final    NO GROWTH Performed at [facility], [place] [address] [place], [place] [address]    Report Status 07/13/2021 FINAL  Final  Resp Panel by RT-PCR (Flu A&B, Covid) Nasopharyngeal Swab     Status: None   Collection Time: 07/11/21  6:00 PM   Specimen: Nasopharyngeal Swab; Nasopharyngeal(NP) swabs in vial transport medium  Result Value Ref  Range Status   SARS Coronavirus 2 by RT PCR NEGATIVE NEGATIVE Final    Comment: (NOTE) SARS-CoV-2 target nucleic acids are NOT DETECTED.  The SARS-CoV-2 RNA is generally detectable in upper respiratory specimens during the acute phase of infection. The lowest concentration of SARS-CoV-2 viral copies this assay can detect is 138 copies/mL. A negative result does not preclude SARS-Cov-2 infection and should not be used as the sole basis for treatment or other patient management decisions. A negative result may occur with  improper specimen collection/handling, submission of specimen other than nasopharyngeal swab, presence of viral mutation(s) within the areas targeted by this assay, and inadequate number of viral copies(<138 copies/mL). A negative result must be combined with clinical observations, patient history, and epidemiological information. The expected result is Negative.  Fact Sheet for Patients:  EntrepreneurPulse.com.au  Fact Sheet for Healthcare Providers:  IncredibleEmployment.be  This test is no t yet approved or cleared by the [place] FDA and  has been authorized for detection and/or diagnosis of SARS-CoV-2 by FDA under an Emergency Use Authorization (EUA). This EUA will remain  in effect (meaning this test can be used) for the duration of the COVID-19 declaration under Section 564(b)(1) of the Act, 21 U.S.C.section 360bbb-3(b)(1), unless the authorization is terminated  or revoked sooner.       Influenza A by PCR NEGATIVE NEGATIVE Final   Influenza B by PCR NEGATIVE NEGATIVE Final    Comment: (NOTE) The Xpert Xpress SARS-CoV-2/FLU/RSV plus assay is intended as an aid in the diagnosis of influenza from Nasopharyngeal swab specimens and should not be used as a sole basis for treatment. Nasal washings and aspirates are unacceptable for Xpert Xpress SARS-CoV-2/FLU/RSV testing.  Fact Sheet for  Patients: EntrepreneurPulse.com.au  Fact Sheet for Healthcare Providers: IncredibleEmployment.be  This test is not yet approved or cleared by the [place] FDA and has been authorized for detection and/or diagnosis of SARS-CoV-2 by FDA under an Emergency Use Authorization (EUA). This EUA will remain in effect (meaning this test can be used) for the duration of the COVID-19 declaration under Section 564(b)(1) of the Act, 21 U.S.C. section 360bbb-3(b)(1), unless the authorization is terminated or revoked.  Performed at [facility], [address] [place], [place] [address]      Scheduled Meds:  aspirin EC  81 mg  Oral Daily   atorvastatin  80 mg Oral Daily   Chlorhexidine Gluconate Cloth  6 each Topical Daily   enoxaparin (LOVENOX) injection  55 mg Subcutaneous Q24H   hydrALAZINE  25 mg Oral Q8H   insulin aspart  0-15 Units Subcutaneous TID WC   insulin aspart  0-5 Units Subcutaneous QHS   lisinopril  5 mg Oral Daily   melatonin  9 mg Oral QHS   metoprolol tartrate  50 mg Oral BID   pantoprazole  40 mg Oral Daily   Continuous Infusions:  ceFEPime (MAXIPIME) IV Stopped (07/13/21 0618)   vancomycin Stopped (07/13/21 0508)    Procedures/Studies: DG Chest Port 1 View  Result Date: 07/11/2021 CLINICAL DATA:  Questionable sepsis EXAM: PORTABLE CHEST 1 VIEW COMPARISON:  Chest radiograph 01/03/2020 FINDINGS: Median sternotomy wires and mediastinal surgical clips are again noted. The heart is enlarged, not significantly changed. The mediastinal contours are otherwise within normal limits. There is vascular congestion without frank pulmonary edema. There is no focal consolidation. There is no significant pleural effusion. There is no appreciable pneumothorax. The bones are unremarkable. IMPRESSION: Cardiomegaly and  vascular congestion without frank pulmonary edema. Electronically Signed   By: Valetta Mole MD   On: 07/11/2021 14:46    Orson Eva,  DO  [facility]  If 7PM-7AM, please contact night-coverage [email or web address] Password [facility] 07/13/2021, 8:23 AM   LOS: 2 days

## 2021-07-13 NOTE — Plan of Care (Signed)

## 2021-07-13 NOTE — Progress Notes (Signed)
Report given to 300 nurse. Taken to room via wheelchair.

## 2021-07-14 DIAGNOSIS — E1165 Type 2 diabetes mellitus with hyperglycemia: Secondary | ICD-10-CM

## 2021-07-14 DIAGNOSIS — T68XXXA Hypothermia, initial encounter: Secondary | ICD-10-CM

## 2021-07-14 LAB — BASIC METABOLIC PANEL
Anion gap: 9 (ref 5–15)
BUN: 12 mg/dL (ref 8–23)
CO2: 25 mmol/L (ref 22–32)
Calcium: 8.6 mg/dL — ABNORMAL LOW (ref 8.9–10.3)
Chloride: 104 mmol/L (ref 98–111)
Creatinine, Ser: 0.87 mg/dL (ref 0.61–1.24)
GFR, Estimated: >60 mL/min (ref >60)
Glucose, Bld: 136 mg/dL — ABNORMAL HIGH (ref 70–99)
Potassium: 3.2 mmol/L — ABNORMAL LOW (ref 3.5–5.1)
Sodium: 138 mmol/L (ref 135–145)

## 2021-07-14 LAB — T4, FREE: Free T4: 1.13 ng/dL — ABNORMAL HIGH (ref 0.61–1.12)

## 2021-07-14 LAB — GLUCOSE, CAPILLARY: Glucose-Capillary: 146 mg/dL — ABNORMAL HIGH (ref 70–99)

## 2021-07-14 LAB — MAGNESIUM: Magnesium: 2 mg/dL (ref 1.7–2.4)

## 2021-07-14 MED ORDER — CYANOCOBALAMIN 1000 MCG/ML IJ SOLN
1000.0000 ug | Freq: Once | INTRAMUSCULAR | Status: AC
  Administered 2021-07-14: 1000 ug via INTRAMUSCULAR
  Filled 2021-07-14: qty 1

## 2021-07-14 MED ORDER — VITAMIN B-12 100 MCG PO TABS: 500.0000 ug | ORAL_TABLET | Freq: Every day | ORAL | Status: DC

## 2021-07-14 MED ORDER — CYANOCOBALAMIN 500 MCG PO TABS: 500.0000 ug | ORAL_TABLET | Freq: Every day | ORAL | Status: DC

## 2021-07-14 MED ORDER — LISINOPRIL 10 MG PO TABS
10.0000 mg | ORAL_TABLET | Freq: Every day | ORAL | Status: DC
  Administered 2021-07-14: 10 mg via ORAL

## 2021-07-14 MED ORDER — POTASSIUM CHLORIDE CRYS ER 20 MEQ PO TBCR: 40.0000 meq | EXTENDED_RELEASE_TABLET | Freq: Once | ORAL | Status: DC

## 2021-07-14 MED ORDER — HYDRALAZINE HCL 25 MG PO TABS: 25.0000 mg | ORAL_TABLET | Freq: Three times a day (TID) | ORAL | 1 refills | Status: DC

## 2021-07-14 MED ORDER — LISINOPRIL 10 MG PO TABS
10.0000 mg | ORAL_TABLET | Freq: Every day | ORAL | 1 refills | Status: DC
Start: 2021-07-14 — End: 2021-09-10

## 2021-07-14 NOTE — Progress Notes (Signed)
Discharge instructions reviewed with pt/son. Pt/son verbalized understanding. Pt discharged in stable condition via wheelchair into the care of his son via private vehicle. A&O. VSS. Pt belongings sent with pt/son. PIV removed intact w/o complications.

## 2021-07-14 NOTE — Discharge Summary (Signed)
Physician Discharge Summary  Alejandro [name] TKP:[id number] DOB: Oct 31, 1941 DOA: 07/11/2021  PCP: Celene Squibb, MD  Admit date: 07/11/2021 Discharge date: 07/14/2021  Admitted From: Home Disposition:  Home   Recommendations for Outpatient Follow-up:  Follow up with PCP in 1-2 weeks Please obtain BMP/CBC in one week   Discharge Condition: Stable CODE STATUS:FULL Diet recommendation: Heart Healthy / Carb Modified    Brief/Interim Summary: 80 year old male with a history of systolic and diastolic CHF, diabetes mellitus type 2, hypertension, coronary artery disease, postoperative atrial fibrillation, hyperlipidemia presenting with dizziness and gait instability.  The patient works in [employer] and works outside on a daily basis.  Apparently, the patient was noted to have some gait instability and altered mental status on 07/11/2021.  His grandson took him home, and the patient went to take a nap.  Apparently, the patient continued to have some dizziness and gait instability.  He had an episode of diaphoresis when he woke up.  As result, the patient was brought to the hospital for further evaluation.  There was concern initially that the patient had not been hydrating well.  The patient did have 2 episodes of nausea and vomiting on the day of admission.  In the emergency department, the patient had SIRS criteria with a temperature of 94.8 F, heart rate in the 40s, WBC 14.2, and lactate 2.0.  Patient was admitted for further evaluation and treatment.  He was started on intravenous antibiotics and IV fluids.  Since then, his mental status has gradually improved.  His vital signs have stabilized.  Discharge Diagnoses:    SIRS -Present on admission -Sepsis ruled out -Presented with hypothermia and leukocytosis with lactate 2.0 -Physiologically improved -Blood cultures remain negative -UA negative for pyuria -Chest x-ray without any infiltrates -Discontinue antibiotics and observe  clinically -Lactic acid 2.0>>1.7 -PCT <0.10 x 3   Acute metabolic encephalopathy -Mental status has improved with hydration and empiric antibiotics -Oral intake has improved -Discontinue IV fluids and IV antibiotics and observe clinically -Serum B12--61>>>given IM inj and start po supplement -NTZ--0.017   Chronic systolic and diastolic CHF -Clinically euvolemic -01/11/2020 echo EF 45%, grade 3 DD, moderate MR, mild AS -Continue aspirin, metoprolol, Lipitor   Coronary artery disease status post CABG -No chest pain presently -Continue aspirin and Lipitor -Continue metoprolol tartrate   Lactic acidosis -Sepsis ruled out   Abnormal EKG -Patient presented with junctional rhythm and RBBB -Improved after electrolyte abnormalities and metabolic abnormalities corrected -07/12/2021 EKG--personally reviewed sinus rhythm, nonspecific T wave changes -remained on telemetry without concerning dysrhythmia   Essential hypertension -Continue metoprolol tartrate -Restart hydralazine -Continue lisinopril>>increase to 10 mg daily -Holding HCTZ>>resume after d/c   Uncontrolled diabetes mellitus type 2 with hyperglycemia -07/12/2021 hemoglobin A1c 8.3 -Holding metformin and Jardiance >>resume after d/c -Continue NovoLog sliding scale   Anxiety -His benzodiazepines were held in the setting of his altered mental status at the time of admission   Class I obesity -BMI 32.56 -Lifestyle modification    Discharge Instructions   Allergies as of 07/14/2021   No Known Allergies      Medication List     STOP taking these medications    LORazepam 0.5 MG tablet Commonly known as: ATIVAN   sitaGLIPtin-metformin 50-1000 MG tablet Commonly known as: JANUMET       TAKE these medications    acetaminophen 500 MG tablet Commonly known as: TYLENOL Take 1,000 mg by mouth every 6 (six) hours as needed.   ALPRAZolam 0.5 MG tablet  Commonly known as: XANAX Take 0.5 mg by mouth at bedtime  as needed for sleep.   aspirin EC 81 MG tablet Take 1 tablet (81 mg total) by mouth daily.   atorvastatin 80 MG tablet Commonly known as: LIPITOR TAKE 1 TABLET BY MOUTH EVERY DAY AT 6 PM   furosemide 20 MG tablet Commonly known as: Lasix Take 20mg  (1 tablet) once daily for 7 days then as needed for edema or weight gain.   hydrALAZINE 25 MG tablet Commonly known as: APRESOLINE Take 1 tablet (25 mg total) by mouth every 8 (eight) hours.   hydrochlorothiazide 25 MG tablet Commonly known as: HYDRODIURIL Take 25 mg by mouth daily.   Jardiance 25 MG Tabs tablet Generic drug: empagliflozin Take 25 mg by mouth daily.   lisinopril 10 MG tablet Commonly known as: ZESTRIL Take 1 tablet (10 mg total) by mouth daily. What changed:  medication strength how much to take   Melatonin 10 MG Tabs Take 30 tablets by mouth every evening.   metFORMIN 1000 MG tablet Commonly known as: GLUCOPHAGE Take 1,000 mg by mouth daily with breakfast.   metoprolol tartrate 50 MG tablet Commonly known as: LOPRESSOR TAKE 1 TABLET(50 MG) BY MOUTH TWICE DAILY   pantoprazole 40 MG tablet Commonly known as: PROTONIX Take 1 tablet by mouth daily.   potassium chloride 10 MEQ CR capsule Commonly known as: MICRO-K Take 10 mEq by mouth daily.   vitamin B-12 500 MCG tablet Commonly known as: CYANOCOBALAMIN Take 1 tablet (500 mcg total) by mouth daily. Start taking on: July 15, 2021        No Known Allergies  Consultations: none   Procedures/Studies: DG Chest Port 1 View  Result Date: 07/11/2021 CLINICAL DATA:  Questionable sepsis EXAM: PORTABLE CHEST 1 VIEW COMPARISON:  Chest radiograph 01/03/2020 FINDINGS: Median sternotomy wires and mediastinal surgical clips are again noted. The heart is enlarged, not significantly changed. The mediastinal contours are otherwise within normal limits. There is vascular congestion without frank pulmonary edema. There is no focal consolidation. There is no  significant pleural effusion. There is no appreciable pneumothorax. The bones are unremarkable. IMPRESSION: Cardiomegaly and  vascular congestion without frank pulmonary edema. Electronically Signed   By: Valetta Mole MD   On: 07/11/2021 14:46        Discharge Exam: Vitals:   07/13/21 2210 07/14/21 0511  BP: (!) 154/51 (!) 155/56  Pulse: (!) 53 (!) 56  Resp: 18 16  Temp: 98 F (36.7 C) 98.2 F (36.8 C)  SpO2: 98% 98%   Vitals:   07/13/21 1417 07/13/21 1820 07/13/21 2210 07/14/21 0511  BP: (!) 164/88 (!) 162/85 (!) 154/51 (!) 155/56  Pulse: (!) 51 (!) 56 (!) 53 (!) 56  Resp: 14 15 18 16   Temp: 97.7 F (36.5 C) 98 F (36.7 C) 98 F (36.7 C) 98.2 F (36.8 C)  TempSrc: Oral Oral Oral Oral  SpO2: 99% 98% 98% 98%  Weight:      Height:        General: Pt is alert, awake, not in acute distress Cardiovascular: RRR, S1/S2 +, no rubs, no gallops Respiratory: CTA bilaterally, no wheezing, no rhonchi Abdominal: Soft, NT, ND, bowel sounds + Extremities: no edema, no cyanosis   The results of significant diagnostics from this hospitalization (including imaging, microbiology, ancillary and laboratory) are listed below for reference.    Significant Diagnostic Studies: DG Chest Port 1 View  Result Date: 07/11/2021 CLINICAL DATA:  Questionable sepsis EXAM: PORTABLE CHEST  1 VIEW COMPARISON:  Chest radiograph 01/03/2020 FINDINGS: Median sternotomy wires and mediastinal surgical clips are again noted. The heart is enlarged, not significantly changed. The mediastinal contours are otherwise within normal limits. There is vascular congestion without frank pulmonary edema. There is no focal consolidation. There is no significant pleural effusion. There is no appreciable pneumothorax. The bones are unremarkable. IMPRESSION: Cardiomegaly and  vascular congestion without frank pulmonary edema. Electronically Signed   By: Valetta Mole MD   On: 07/11/2021 14:46    Microbiology: Recent Results (from  the past 240 hour(s))  Blood Culture (routine x 2)     Status: None (Preliminary result)   Collection Time: 07/11/21  2:34 PM   Specimen: BLOOD  Result Value Ref Range Status   Specimen Description BLOOD RIGHT ANTECUBITAL  Final   Special Requests   Final    BOTTLES DRAWN AEROBIC AND ANAEROBIC Blood Culture adequate volume   Culture   Final    NO GROWTH 2 DAYS Performed at [facility], [address], [place], [place] [address]    Report Status PENDING  Incomplete  Blood Culture (routine x 2)     Status: None (Preliminary result)   Collection Time: 07/11/21  2:34 PM   Specimen: BLOOD  Result Value Ref Range Status   Specimen Description BLOOD RIGHT WRIST  Final   Special Requests   Final    BOTTLES DRAWN AEROBIC AND ANAEROBIC Blood Culture adequate volume   Culture   Final    NO GROWTH 2 DAYS Performed at [facility], [address], [place], [place] [address]    Report Status PENDING  Incomplete  Urine Culture     Status: None   Collection Time: 07/11/21  3:30 PM   Specimen: Urine, Random  Result Value Ref Range Status   Specimen Description   Final    URINE, RANDOM Performed at [facility], [address] [place], [place] [address]    Special Requests   Final    URINE, RANDOM Performed at [facility], [address], [place], [place] [address]    Culture   Final    NO GROWTH Performed at [facility], [place] [address] [place],  [address]    Report Status 07/13/2021 FINAL  Final  Resp Panel by RT-PCR (Flu A&B, Covid) Nasopharyngeal Swab     Status: None   Collection Time: 07/11/21  6:00 PM   Specimen: Nasopharyngeal Swab; Nasopharyngeal(NP) swabs in vial transport medium  Result Value Ref Range Status   SARS Coronavirus 2 by RT PCR NEGATIVE NEGATIVE Final    Comment: (NOTE) SARS-CoV-2 target nucleic acids are NOT DETECTED.  The SARS-CoV-2 RNA is generally detectable in upper respiratory specimens during the acute phase of infection. The  lowest concentration of SARS-CoV-2 viral copies this assay can detect is 138 copies/mL. A negative result does not preclude SARS-Cov-2 infection and should not be used as the sole basis for treatment or other patient management decisions. A negative result may occur with  improper specimen collection/handling, submission of specimen other than nasopharyngeal swab, presence of viral mutation(s) within the areas targeted by this assay, and inadequate number of viral copies(<138 copies/mL). A negative result must be combined with clinical observations, patient history, and epidemiological information. The expected result is Negative.  Fact Sheet for Patients:  EntrepreneurPulse.com.au  Fact Sheet for Healthcare Providers:  IncredibleEmployment.be  This test is no t yet approved or cleared by the [place] and  has been authorized for  detection and/or diagnosis of SARS-CoV-2 by FDA under an Emergency Use Authorization (EUA). This EUA will remain  in effect (meaning this test can be used) for the duration of the COVID-19 declaration under Section 564(b)(1) of the Act, 21 U.S.C.section 360bbb-3(b)(1), unless the authorization is terminated  or revoked sooner.       Influenza A by PCR NEGATIVE NEGATIVE Final   Influenza B by PCR NEGATIVE NEGATIVE Final    Comment: (NOTE) The Xpert Xpress SARS-CoV-2/FLU/RSV plus assay is intended as an aid in the diagnosis of influenza from Nasopharyngeal swab specimens and should not be used as a sole basis for treatment. Nasal washings and aspirates are unacceptable for Xpert Xpress SARS-CoV-2/FLU/RSV testing.  Fact Sheet for Patients: EntrepreneurPulse.com.au  Fact Sheet for Healthcare Providers: IncredibleEmployment.be  This test is not yet approved or cleared by the [place] FDA and has been authorized for detection and/or diagnosis of SARS-CoV-2 by FDA under  an Emergency Use Authorization (EUA). This EUA will remain in effect (meaning this test can be used) for the duration of the COVID-19 declaration under Section 564(b)(1) of the Act, 21 U.S.C. section 360bbb-3(b)(1), unless the authorization is terminated or revoked.  Performed at [facility], [address], [place], [place] [address]   MRSA Next Gen by PCR, Nasal     Status: None   Collection Time: 07/13/21  1:38 AM   Specimen: Nasal Mucosa; Nasal Swab  Result Value Ref Range Status   MRSA by PCR Next Gen NOT DETECTED NOT DETECTED Final    Comment: (NOTE) The GeneXpert MRSA Assay (FDA approved for NASAL specimens only), is one component of a comprehensive MRSA colonization surveillance program. It is not intended to diagnose MRSA infection nor to guide or monitor treatment for MRSA infections. Test performance is not FDA approved in patients less than 65 years old. Performed at [facility], [address] [place],  [address]      Labs: Basic Metabolic Panel: Recent Labs  Lab 07/11/21 1337 07/11/21 1655 07/12/21 0528 07/13/21 0840 07/14/21 0432  NA 135  --  133* 135 138  K 3.1*  --  3.6 3.4* 3.2*  CL 103  --  103 108 104  CO2 22  --  21* 24 25  GLUCOSE 242*  --  248* 150* 136*  BUN 21  --  18 14 12   CREATININE 1.08  --  0.95 0.94 0.87  CALCIUM 9.0  --  8.3* 8.5* 8.6*  MG  --  1.8  --  1.9 2.0   Liver Function Tests: Recent Labs  Lab 07/13/21 0840  AST 23  ALT 17  ALKPHOS 74  BILITOT 0.9  PROT 6.8  ALBUMIN 3.6   No results for input(s): LIPASE, AMYLASE in the last 168 hours. Recent Labs  Lab 07/13/21 0840  AMMONIA 14   CBC: Recent Labs  Lab 07/11/21 1337 07/12/21 0528 07/13/21 0840  WBC 14.2* 10.2 9.9  HGB 14.4 12.2* 13.6  HCT 42.7 36.5* 40.1  MCV 89.9 90.8 90.5  PLT 218 180 189   Cardiac Enzymes: Recent Labs  Lab 07/11/21 1655  CKTOTAL 102   BNP: Invalid input(s): POCBNP CBG: Recent Labs  Lab 07/13/21 1111 07/13/21 1609  07/13/21 1810 07/13/21 2201 07/14/21 0750  GLUCAP 172* 171* 134* 160* 146*    Time coordinating discharge:  36 minutes  Signed:  Orson Eva, DO [facility] Pager: [phone] 07/14/2021, 9:05 AM

## 2021-07-16 LAB — CULTURE, BLOOD (ROUTINE X 2)
Culture: NO GROWTH
Culture: NO GROWTH
Special Requests: ADEQUATE
Special Requests: ADEQUATE

## 2021-07-17 DIAGNOSIS — G9341 Metabolic encephalopathy: Secondary | ICD-10-CM | POA: Diagnosis not present

## 2021-07-17 DIAGNOSIS — J019 Acute sinusitis, unspecified: Secondary | ICD-10-CM | POA: Diagnosis not present

## 2021-07-17 DIAGNOSIS — R6510 Systemic inflammatory response syndrome (SIRS) of non-infectious origin without acute organ dysfunction: Secondary | ICD-10-CM | POA: Diagnosis not present

## 2021-07-17 DIAGNOSIS — E876 Hypokalemia: Secondary | ICD-10-CM | POA: Diagnosis not present

## 2021-07-17 DIAGNOSIS — R066 Hiccough: Secondary | ICD-10-CM | POA: Diagnosis not present

## 2021-07-17 DIAGNOSIS — R2689 Other abnormalities of gait and mobility: Secondary | ICD-10-CM | POA: Diagnosis not present

## 2021-07-17 DIAGNOSIS — I5032 Chronic diastolic (congestive) heart failure: Secondary | ICD-10-CM | POA: Diagnosis not present

## 2021-07-22 ENCOUNTER — Ambulatory Visit: Payer: Medicare HMO | Admitting: Cardiology

## 2021-07-22 ENCOUNTER — Other Ambulatory Visit: Payer: Self-pay

## 2021-07-22 ENCOUNTER — Emergency Department (HOSPITAL_COMMUNITY)
Admission: EM | Admit: 2021-07-22 | Discharge: 2021-07-22 | Discharge status: Against Medical Advice | Payer: Medicare HMO

## 2021-07-22 ENCOUNTER — Encounter: Payer: Self-pay | Admitting: Cardiology

## 2021-07-22 VITALS — BP 126/80 | HR 65 | Ht 71.0 in | Wt 231.0 lb

## 2021-07-22 DIAGNOSIS — I429 Cardiomyopathy, unspecified: Secondary | ICD-10-CM

## 2021-07-22 DIAGNOSIS — I350 Nonrheumatic aortic (valve) stenosis: Secondary | ICD-10-CM

## 2021-07-22 DIAGNOSIS — I5042 Chronic combined systolic (congestive) and diastolic (congestive) heart failure: Secondary | ICD-10-CM | POA: Diagnosis not present

## 2021-07-22 DIAGNOSIS — I255 Ischemic cardiomyopathy: Secondary | ICD-10-CM

## 2021-07-22 DIAGNOSIS — R066 Hiccough: Secondary | ICD-10-CM | POA: Diagnosis not present

## 2021-07-22 NOTE — Patient Instructions (Signed)
Medication Instructions:  Your physician recommends that you continue on your current medications as directed. Please refer to the Current Medication list given to you today.  *If you need a refill on your cardiac medications before your next appointment, please call your pharmacy*   Lab Work: None If you have labs (blood work) drawn today and your tests are completely normal, you will receive your results only by: [place] (if you have MyChart) OR A paper copy in the mail If you have any lab test that is abnormal or we need to change your treatment, we will call you to review the results.   Testing/Procedures: Your physician has requested that you have an echocardiogram. Echocardiography is a painless test that uses sound waves to create images of your heart. It provides your doctor with information about the size and shape of your heart and how well your heart's chambers and valves are working. This procedure takes approximately one hour. There are no restrictions for this procedure.    Follow-Up: At [facility], you and your health needs are our priority.  As part of our continuing mission to provide you with exceptional heart care, we have created designated Provider Care Teams.  These Care Teams include your primary Cardiologist (physician) and Advanced Practice Providers (APPs -  Physician Assistants and Nurse Practitioners) who all work together to provide you with the care you need, when you need it.  We recommend signing up for the patient portal called "MyChart".  Sign up information is provided on this After Visit Summary.  MyChart is used to connect with patients for Virtual Visits (Telemedicine).  Patients are able to view lab/test results, encounter notes, upcoming appointments, etc.  Non-urgent messages can be sent to your provider as well.   To learn more about what you can do with MyChart, go to [email or web address].    Your next appointment:   3  month(s)  The format for your next appointment:   In Person  Provider:   Rozann Lesches, MD   Other Instructions

## 2021-07-22 NOTE — Progress Notes (Signed)
Cardiology Office Note  Date: 07/22/2021   ID: Carney, Saxton October 10, 1941, MRN [id number]  PCP:  Celene Squibb, MD  Cardiologist:  Rozann Lesches, MD Electrophysiologist:  None   Chief Complaint  Patient presents with   Cardiac follow-up    History of Present Illness: Alejandro [name] is a 80 y.o. male last seen in March 2021.  He presents for a follow-up cardiac visit.  Records indicate recent hospitalization with concern for possible sepsis based on presentation with hypothermia and leukocytosis with lactate 2.0, although no source was identified and he improved with supportive measures, antibiotics discontinued.  He did have an acute metabolic cephalopathy which also cleared.  From a cardiac perspective he was noted to have bradycardia, initially with junctional rhythm although this improved with resolution of electrolyte abnormalities.  He states that since getting out of the hospital he has had trouble with recurrent hiccups.  No cough or shortness of breath necessarily, no reflux.  States that he is eating normally, no abdominal pain.  No pleuritic chest pain.  He states that he went to the [name], ER this morning to be seen but ultimately left and went to an urgent care in [place] where he was prescribed omeprazole and baclofen.  I have recommended that he follow-up with his PCP Dr. Nevada Crane in a week or so.  If he continues to have uncontrolled hiccups he may need further abdominal/chest imaging to exclude process irritating the diaphragm.  In terms of follow-up cardiac testing, he is due for follow-up echocardiogram which we will arrange.  Study from February 2021 revealed LVEF 45% with evidence of mild aortic stenosis.  Past Medical History:  Diagnosis Date   Aortic insufficiency    a. mild-mod by intraop TEE 05/2018 (no aortic stenosis).   CAD (coronary artery disease)    a. NSTEMI 05/2018 with multivessel disease -> s/p CABGx4 05/11/18.   Chronic diastolic CHF (congestive  heart failure) ([facility])    a. dx 05/2018.   CKD (chronic kidney disease), stage III ([place])    History of nephrolithiasis 2004   Hyperlipidemia    Hypertension    IBS (irritable colon syndrome)    Postoperative atrial fibrillation ([facility])    PVC's (premature ventricular contractions)    Reflux    Type 2 diabetes mellitus ([place])     Past Surgical History:  Procedure Laterality Date   CORONARY ARTERY BYPASS GRAFT N/A 05/11/2018   Procedure: CORONARY ARTERY BYPASS GRAFTING (CABG), on pump, times four, using left internal mammary artery and endoscopically harvested right greater saphenous leg vein.;  Surgeon: Ivin Poot, MD;  Location: [facility];  Service: Open Heart Surgery;  Laterality: N/A;   ESOPHAGOGASTRODUODENOSCOPY  02/04/2012   Procedure: ESOPHAGOGASTRODUODENOSCOPY (EGD);  Surgeon: Rogene Houston, MD;  [facility];  Service: Endoscopy;  Laterality: N/A;  100   LEFT HEART CATH AND CORONARY ANGIOGRAPHY N/A 05/04/2018   Procedure: LEFT HEART CATH AND CORONARY ANGIOGRAPHY;  Surgeon: Belva Crome, MD;  Location: [facility];  Service: Cardiovascular;  Laterality: N/A;   NASAL FRACTURE SURGERY     TEE WITHOUT CARDIOVERSION N/A 05/11/2018   Procedure: TRANSESOPHAGEAL ECHOCARDIOGRAM (TEE);  Surgeon: Prescott Gum, Collier Salina, MD;  Location: [facility];  Service: Open Heart Surgery;  Laterality: N/A;    Current Outpatient Medications  Medication Sig Dispense Refill   acetaminophen (TYLENOL) 500 MG tablet Take 1,000 mg by mouth every 6 (six) hours as needed.     aspirin EC  81 MG tablet Take 1 tablet (81 mg total) by mouth daily.     atorvastatin (LIPITOR) 80 MG tablet TAKE 1 TABLET BY MOUTH EVERY DAY AT 6 PM 30 tablet 6   baclofen (LIORESAL) 10 MG tablet Take 5 mg by mouth 3 (three) times daily.     furosemide (LASIX) 20 MG tablet Take 20mg  (1 tablet) once daily for 7 days then as needed for edema or weight gain. 30 tablet 3   hydrALAZINE (APRESOLINE) 25 MG tablet Take 1 tablet (25 mg total) by  mouth every 8 (eight) hours. 90 tablet 1   hydrochlorothiazide (HYDRODIURIL) 25 MG tablet Take 25 mg by mouth daily.     JARDIANCE 25 MG TABS tablet Take 25 mg by mouth daily.     lisinopril (ZESTRIL) 10 MG tablet Take 1 tablet (10 mg total) by mouth daily. 30 tablet 1   Melatonin 10 MG TABS Take 30 tablets by mouth every evening.     metFORMIN (GLUCOPHAGE) 1000 MG tablet Take 1,000 mg by mouth daily with breakfast.     metoprolol tartrate (LOPRESSOR) 50 MG tablet TAKE 1 TABLET(50 MG) BY MOUTH TWICE DAILY 180 tablet 2   omeprazole (PRILOSEC) 20 MG capsule Take 20 mg by mouth daily.     pantoprazole (PROTONIX) 40 MG tablet Take 1 tablet by mouth daily.     potassium chloride (MICRO-K) 10 MEQ CR capsule Take 10 mEq by mouth daily.     vitamin B-12 (CYANOCOBALAMIN) 500 MCG tablet Take 1 tablet (500 mcg total) by mouth daily.     ALPRAZolam (XANAX) 0.5 MG tablet Take 0.5 mg by mouth at bedtime as needed for sleep. (Patient not taking: Reported on 07/22/2021)     No current facility-administered medications for this visit.   Allergies:  Patient has no known allergies.   ROS: No syncope.  Physical Exam: VS:  BP 126/80   Pulse 65   Ht 5\' 11"  (1.803 m)   Wt 231 lb (104.8 kg)   SpO2 97%   BMI 32.22 kg/m , BMI Body mass index is 32.22 kg/m.  Wt Readings from Last 3 Encounters:  07/22/21 231 lb (104.8 kg)  07/13/21 233 lb 7.5 oz (105.9 kg)  02/24/20 249 lb 3.2 oz (113 kg)    General: Patient appears comfortable at rest. HEENT: Conjunctiva and lids normal, wearing a mask. Neck: Supple, no elevated JVP or carotid bruits, no thyromegaly. Lungs: Clear to auscultation, nonlabored breathing at rest. Cardiac: Regular rate and rhythm, no S3, 2/6 systolic murmur, no pericardial rub. Abdomen: Protuberant, nontender, bowel sounds present, no guarding or rebound. Extremities: No pitting edema.  ECG:  An ECG dated sinus bradycardia with PVCs, LVH was personally reviewed today and demonstrated:   Sinus bradycardia with PVCs, LVH and repolarization abnormalities.  Recent Labwork: 07/13/2021: ALT 17; AST 23; Hemoglobin 13.6; Platelets 189; TSH 1.399 07/14/2021: BUN 12; Creatinine, Ser 0.87; Magnesium 2.0; Potassium 3.2; Sodium 138   Other Studies Reviewed Today:  Echocardiogram 01/11/2020:  1. Left ventricular ejection fraction, by estimation, is 45%. The left  ventricle has mildly decreased function. The left ventrical demonstrates  global hypokinesis, more prominent in the basal inferior wall. The left  ventricular internal cavity size was  moderately dilated. There is mildly increased left ventricular  hypertrophy. Left ventricular diastolic parameters are consistent with  Grade III diastolic dysfunction (restrictive).   2. Right ventricular systolic function is low normal. The right  ventricular size is normal. There is moderately elevated pulmonary artery  systolic pressure. The estimated right ventricular systolic pressure is  19.7 mmHg.   3. Left atrial size was severely dilated.   4. Right atrial size was mildly dilated.   5. The mitral valve is grossly normal. Moderate mitral valve  regurgitation.   6. The aortic valve is abnormal, appears to be functionally bicuspid.  Aortic valve regurgitation is mild. Mild aortic valve stenosis. Aortic  valve area, by VTI measures 1.40 cm. Aortic valve mean gradient measures  18.7 mmHg.   7. Aortic dilatation noted. There is mild dilatation of the aortic root  measuring 43 mm.   8. The inferior vena cava is normal in size with greater than 50%  respiratory variability, suggesting right atrial pressure of 3 mmHg.   Lexiscan Myoview 06/07/2020: There was no ST segment deviation noted during stress. Findings consistent with prior inferior myocardial infarction with mild peri-infarct ischemia. This is an intermediate risk study. Risk based primarily on decreased LVEF, from perfusion standpoint alone low risk with only mild peri-infarct  ischemia. Consider correlating LVEF with echo to better clarify risk. The left ventricular ejection fraction is moderately decreased (37%).  Chest x-ray 07/12/2019: FINDINGS: Median sternotomy wires and mediastinal surgical clips are again noted.   The heart is enlarged, not significantly changed. The mediastinal contours are otherwise within normal limits.   There is vascular congestion without frank pulmonary edema. There is no focal consolidation. There is no significant pleural effusion. There is no appreciable pneumothorax.   The bones are unremarkable.   IMPRESSION: Cardiomegaly and  vascular congestion without frank pulmonary edema.  Assessment and Plan:  1.  Ischemic cardiomyopathy with LVEF 45% and chronic combined heart failure.  Weight has been stable and he continues on Lasix with potassium supplement in addition to lisinopril, Lipitor, hydralazine, Jardiance, and metoprolol.  No changes were made today.  We will update his echocardiogram.  2.  Mild aortic stenosis, asymptomatic with stable cardiac murmur.  3.  CKD stage IIIb, recent creatinine normal at 0.87.  4.  Moderate mitral regurgitation.  Medication Adjustments/Labs and Tests Ordered: Current medicines are reviewed at length with the patient today.  Concerns regarding medicines are outlined above.   Tests Ordered: Orders Placed This Encounter  Procedures   ECHOCARDIOGRAM COMPLETE     Medication Changes: No orders of the defined types were placed in this encounter.   Disposition:  Follow up  3 months.  Signed, Satira Sark, MD, Ale[name] 07/22/2021 2:28 PM    [name] [address] [place], [place] [address] Phone: [phone]; Fax: [phone]

## 2021-07-22 NOTE — ED Triage Notes (Signed)
ED screener informed this RN that pt left at 1013 prior to being seen in triage.

## 2021-07-24 ENCOUNTER — Other Ambulatory Visit (HOSPITAL_COMMUNITY): Payer: Self-pay | Admitting: Internal Medicine

## 2021-07-24 ENCOUNTER — Other Ambulatory Visit: Payer: Self-pay | Admitting: Internal Medicine

## 2021-07-24 DIAGNOSIS — R0600 Dyspnea, unspecified: Secondary | ICD-10-CM

## 2021-07-31 DIAGNOSIS — E1165 Type 2 diabetes mellitus with hyperglycemia: Secondary | ICD-10-CM | POA: Diagnosis not present

## 2021-07-31 DIAGNOSIS — K219 Gastro-esophageal reflux disease without esophagitis: Secondary | ICD-10-CM | POA: Diagnosis not present

## 2021-08-14 ENCOUNTER — Other Ambulatory Visit: Payer: Self-pay

## 2021-08-14 ENCOUNTER — Ambulatory Visit (HOSPITAL_COMMUNITY)
Admission: RE | Admit: 2021-08-14 | Discharge: 2021-08-14 | Disposition: A | Discharge status: Home / Self Care | Payer: Medicare HMO | Source: Ambulatory Visit | Attending: Cardiology | Admitting: Cardiology

## 2021-08-14 DIAGNOSIS — I429 Cardiomyopathy, unspecified: Secondary | ICD-10-CM | POA: Diagnosis not present

## 2021-08-14 DIAGNOSIS — I350 Nonrheumatic aortic (valve) stenosis: Secondary | ICD-10-CM | POA: Insufficient documentation

## 2021-08-14 LAB — ECHOCARDIOGRAM COMPLETE
AR max vel: 1.71 cm2
AV Area VTI: 2.19 cm2
AV Area mean vel: 1.64 cm2
AV Mean grad: 16 mmHg
AV Peak grad: 32 mmHg
Ao pk vel: 2.83 m/s
Area-P 1/2: 2.24 cm2
S' Lateral: 4.5 cm

## 2021-08-14 NOTE — Progress Notes (Signed)
*  PRELIMINARY RESULTS* Echocardiogram 2D Echocardiogram has been performed.  Alejandro [name] 08/14/2021, 9:52 AM

## 2021-08-23 ENCOUNTER — Ambulatory Visit (HOSPITAL_COMMUNITY)
Admission: RE | Admit: 2021-08-23 | Discharge: 2021-08-23 | Disposition: A | Discharge status: Home / Self Care | Payer: Medicare HMO | Source: Ambulatory Visit | Attending: Internal Medicine | Admitting: Internal Medicine

## 2021-08-23 ENCOUNTER — Other Ambulatory Visit: Payer: Self-pay

## 2021-08-23 DIAGNOSIS — I700 Atherosclerosis of aorta: Secondary | ICD-10-CM | POA: Diagnosis not present

## 2021-08-23 DIAGNOSIS — R0602 Shortness of breath: Secondary | ICD-10-CM | POA: Diagnosis not present

## 2021-08-23 DIAGNOSIS — R0600 Dyspnea, unspecified: Secondary | ICD-10-CM | POA: Insufficient documentation

## 2021-08-23 LAB — POCT I-STAT CREATININE: Creatinine, Ser: 1 mg/dL (ref 0.61–1.24)

## 2021-08-23 MED ORDER — IOHEXOL 350 MG/ML SOLN
60.0000 mL | Freq: Once | INTRAVENOUS | Status: AC | PRN
  Administered 2021-08-23: 60 mL via INTRAVENOUS

## 2021-08-30 DIAGNOSIS — E1165 Type 2 diabetes mellitus with hyperglycemia: Secondary | ICD-10-CM | POA: Diagnosis not present

## 2021-08-30 DIAGNOSIS — K219 Gastro-esophageal reflux disease without esophagitis: Secondary | ICD-10-CM | POA: Diagnosis not present

## 2021-09-02 ENCOUNTER — Ambulatory Visit: Payer: Medicare HMO | Admitting: Physician Assistant

## 2021-09-05 ENCOUNTER — Institutional Professional Consult (permissible substitution): Payer: Medicare HMO | Admitting: Internal Medicine

## 2021-09-10 ENCOUNTER — Other Ambulatory Visit: Payer: Self-pay

## 2021-09-10 ENCOUNTER — Ambulatory Visit: Payer: Medicare HMO | Admitting: Internal Medicine

## 2021-09-10 ENCOUNTER — Encounter: Payer: Self-pay | Admitting: Internal Medicine

## 2021-09-10 VITALS — BP 132/78 | HR 84 | Temp 98.2°F | Ht 71.0 in | Wt 230.1 lb

## 2021-09-10 DIAGNOSIS — Z23 Encounter for immunization: Secondary | ICD-10-CM

## 2021-09-10 DIAGNOSIS — R0609 Other forms of dyspnea: Secondary | ICD-10-CM

## 2021-09-10 DIAGNOSIS — I10 Essential (primary) hypertension: Secondary | ICD-10-CM | POA: Diagnosis not present

## 2021-09-10 MED ORDER — VALSARTAN 80 MG PO TABS: 80.0000 mg | ORAL_TABLET | Freq: Every day | ORAL | 11 refills | Status: DC

## 2021-09-10 NOTE — Patient Instructions (Addendum)
Stop lisinopril  and start valsartan 80 mg one daily in it's place and the voice should improve and the cough should go away over several weeks   Recommend paced walking and keep track of your 02 saturation at peak exertion (not after stopping)  -once a week is enough to see if trending down (above 90% is the goal)   We will walk you slowly around the office today for a baseline to compare to next visit - Be sure to bring your cane    Please schedule a follow up office visit in 6 weeks, call sooner if needed if any questions about the Blood pressure meds

## 2021-09-10 NOTE — Assessment & Plan Note (Addendum)
Onset spring 2012 - Echo 08/14/21  mild AS / Mild LAE/ nl R side  - CT chest  08/23/21 ? Mild early PF - trial off acei 09/10/2021 >>>  - 09/10/2021   Walked on RA x  3  lap(s) =  approx 450 @ slow pace,   with lowest 02 sats 97%  At end but doe on the 2nd lap   Symptoms are markedly disproportionate to objective findings and not clear to what extent this is actually a pulmonary  problem but pt does appear to have difficult to sort out respiratory symptoms of unknown origin for which  DDX  = almost all start with A and  include Adherence, Ace Inhibitors, Acid Reflux, Active Sinus Disease, Alpha 1 Antitripsin deficiency, Anxiety masquerading as Airways dz,  ABPA,  Allergy(esp in young), Aspiration (esp in elderly), Adverse effects of meds,  Active smoking or Vaping, A bunch of PE's/clot burden (a few small clots can't cause this syndrome unless there is already severe underlying pulm or vascular dz with poor reserve),  Anemia or thyroid disorder, plus two Bs  = Bronchiectasis and Beta blocker use..and one C= CHF     Adherence is always the initial "prime suspect" and is a multilayered concern that requires a "trust but verify" approach in every patient - starting with knowing how to use medications, especially inhalers, correctly, keeping up with refills and understanding the fundamental difference between maintenance and prns vs those medications only taken for a very short course and then stopped and not refilled.   ACEi adverse effects at the  top of the usual list of suspects and the only way to rule it out is a trial off > see a/p    ? Acid (or non-acid) GERD > always difficult to exclude as up to 75% of pts in some series report no assoc GI/ Heartburn symptoms> rec continue max (24h)  acid suppression as the    ? Allergy ? Asthma > nothing to suggest this or COPD in this never smoker   ? Anemia/ thyroid dz > already ruled out at admit  ? Adverse drug effects > some of hi fatigue may be due to  high dose lipitor??  ? Anxiety/depression/ deconditioning > usually at the bottom of this list of usual suspects but   note already on psychotropics and may interfere with adherence and also interpretation of response or lack thereof to symptom management which can be quite subjective> rx per PCP   ? chf / cardiac asthma > echo c/w just mild AS at this point

## 2021-09-10 NOTE — Progress Notes (Signed)
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  The patient works in [employer] and works outside on a daily basis.  Apparently, the patient was noted to have some gait instability and altered mental status on 07/11/2021.  His grandson took him home, and the patient went to take a nap.  Apparently, the patient continued to have some dizziness and gait instability.  He had an episode of diaphoresis when he woke up.  As result, the patient was brought to the hospital for further evaluation.  There was concern initially that the patient had not been hydrating well.  The patient did have 2 episodes of nausea and vomiting on the day of admission.  In the emergency department, the patient had SIRS criteria with a temperature of 94.8 F, heart rate in the 40s, WBC 14.2, and lactate 2.0.  Patient was admitted for further evaluation and treatment.  He was started on intravenous antibiotics and IV fluids.  Since then, his mental status has gradually improved.  His vital signs have stabilized.   Discharge Diagnoses:    SIRS -Present on admission -Sepsis ruled out -Presented with hypothermia and leukocytosis with lactate 2.0 -Physiologically improved -Blood cultures remain negative -UA negative for pyuria -Chest x-ray without any infiltrates -Discontinue antibiotics and observe clinically -Lactic acid 2.0>>1.7 -PCT <0.10 x 3    Acute metabolic encephalopathy -Mental status has improved with hydration and empiric antibiotics -Oral intake has improved -Discontinue IV fluids and IV antibiotics and observe clinically -Serum B12--61>>>given IM inj and start po supplement -PPI--9.518   Chronic systolic and diastolic CHF -Clinically euvolemic -01/11/2020 echo EF 45%, grade 3 DD, moderate MR, mild AS -Continue aspirin, metoprolol, Lipitor   Coronary artery disease status post CABG -No chest pain presently -Continue aspirin and Lipitor -Continue metoprolol tartrate   Lactic acidosis -Sepsis ruled out   Abnormal EKG -Patient presented with junctional rhythm and RBBB -Improved after electrolyte abnormalities and metabolic abnormalities corrected -07/12/2021 EKG--personally reviewed sinus rhythm, nonspecific T wave changes -remained on telemetry without concerning dysrhythmia   Essential hypertension -Continue metoprolol tartrate -Restart hydralazine -Continue lisinopril>>increase to 10 mg daily -Holding HCTZ>>resume after d/c   Uncontrolled diabetes mellitus type 2 with hyperglycemia -07/12/2021 hemoglobin A1c 8.3 -Holding metformin and Jardiance >>resume after d/c -Continue NovoLog sliding scale   Anxiety -His benzodiazepines were held in the setting of his altered mental status at the time of admission   History of Present Illness  09/10/2021  Pulmonary/ 1st office eval/Amira Podolak  Chief Complaint  Patient presents with   Consult    Abnormal chest ct. Fatigue, no SOB. Persistent dry cough.   Dyspnea:  progressively worse x 6 m but limited by dizzy /sob about the same time  Cough: dry  raspy cough  x about the same time Sleep: fine 2 pillows  SABA use: none   No obvious day to day or daytime variability or assoc excess/ purulent sputum or  mucus plugs or hemoptysis or cp or chest tightness, subjective wheeze or overt sinus or hb symptoms.   Sleeping  without nocturnal  or early am exacerbation  of  respiratory  c/o's or need for noct saba. Also denies any obvious fluctuation of symptoms with weather or environmental changes or other aggravating or alleviating factors except as outlined above   No unusual exposure hx or h/o childhood pna/ asthma or knowledge of premature birth.  Current Allergies, Complete Past Medical History, Past Surgical History, Family History, and Social History were reviewed in [facility] record.  ROS  The following are not active complaints unless bolded Hoarseness, sore throat, dysphagia, dental problems, itching, sneezing,  nasal congestion or discharge of excess mucus or purulent secretions, ear ache,   fever, chills, sweats, unintended wt loss or wt gain, classically pleuritic or exertional cp,  orthopnea pnd or arm/hand swelling  or leg swelling, presyncope, palpitations, abdominal pain, anorexia, nausea, vomiting, diarrhea  or change in bowel habits or change in bladder habits, change in stools or change in urine, dysuria, hematuria,  rash, arthralgias, visual complaints, headache, numbness, weakness or ataxia or problems with walking -uses cane or coordination,  change in mood or  memory.           Past Medical History:  Diagnosis Date   Aortic insufficiency    a. mild-mod by intraop TEE 05/2018 (no aortic stenosis).   CAD (coronary artery disease)    a. NSTEMI 05/2018 with multivessel disease -> s/p CABGx4 05/11/18.   Chronic diastolic CHF (congestive heart failure) ([place])    a. dx 05/2018.   CKD (chronic kidney disease), stage III ([place])    History of nephrolithiasis 2004   Hyperlipidemia    Hypertension    IBS (irritable colon syndrome)    Postoperative atrial fibrillation ([facility])    PVC's (premature ventricular contractions)    Reflux    Type 2 diabetes mellitus ([place])     Outpatient Medications Prior to Visit  Medication Sig Dispense Refill   atorvastatin (LIPITOR) 80 MG tablet TAKE 1 TABLET BY MOUTH EVERY DAY AT 6 PM 30 tablet  6   furosemide (LASIX) 20 MG tablet Take 20mg  (1 tablet) once daily for 7 days then as needed for edema or weight gain. 30 tablet 3   hydrochlorothiazide (HYDRODIURIL) 25 MG tablet Take 25 mg by mouth daily.     JARDIANCE 25 MG TABS tablet Take 25 mg by mouth daily.     lisinopril (ZESTRIL) 10 MG tablet Take 1 tablet (10 mg total) by mouth daily. 30 tablet 1   Melatonin 10 MG TABS Take 3 each by mouth every evening. Gummies     metFORMIN (GLUCOPHAGE) 1000 MG tablet Take 1,000 mg by mouth daily with breakfast.     metoprolol tartrate (LOPRESSOR) 50 MG tablet TAKE 1 TABLET(50 MG) BY MOUTH TWICE DAILY 180 tablet 2   pantoprazole (PROTONIX) 40 MG tablet Take 1 tablet by mouth daily.     potassium chloride (MICRO-K) 10 MEQ CR capsule Take 10 mEq by mouth daily.     vitamin B-12 (CYANOCOBALAMIN) 500 MCG tablet Take 1 tablet (500 mcg total) by mouth daily.     acetaminophen (TYLENOL) 500 MG tablet Take 1,000 mg by mouth every 6 (six) hours as needed.     ALPRAZolam (XANAX) 0.5 MG tablet Take 0.5 mg by mouth at bedtime as needed for sleep. (Patient not taking: No sig reported)     aspirin EC 81 MG tablet  Take 1 tablet (81 mg total) by mouth daily.     baclofen (LIORESAL) 10 MG tablet Take 5 mg by mouth 3 (three) times daily.     hydrALAZINE (APRESOLINE) 25 MG tablet Take 1 tablet (25 mg total) by mouth every 8 (eight) hours. 90 tablet 1   omeprazole (PRILOSEC) 20 MG capsule Take 20 mg by mouth daily.     No facility-administered medications prior to visit.     Objective:     BP 132/78 (BP Location: Left Arm, Patient Position: Sitting)   Pulse 84   Temp 98.2 F (36.8 C) (Temporal)   Ht 5\' 11"  (1.803 m)   Wt 230 lb 1.9 oz (104.4 kg)   SpO2 96%   BMI 32.10 kg/m   SpO2: 96 %  Amb hoarse  somber wm nad    HEENT : pt wearing mask not removed for exam due to covid -19 concerns.    NECK :  without JVD/Nodes/TM/ nl carotid upstrokes bilaterally   LUNGS: no acc muscle use,  Nl contour   with trace insp crackles bases  bilaterally without cough on insp or exp maneuvers   CV:  RRR  no s3   2-3/6 sem s  increase in P2, and no edema   ABD:  soft and nontender with nl inspiratory excursion in the supine position. No bruits or organomegaly appreciated, bowel sounds nl  MS:  Nl gait/ ext warm without deformities, calf tenderness, cyanosis or clubbing No obvious joint restrictions   SKIN: warm and dry without lesions    NEURO:  alert, approp, nl sensorium with  no motor or cerebellar deficits apparent.     I personally reviewed images and agree with radiology impression as follows:   Chest CT w contrast 08/23/21 Peripheral interstitial thickening in the lower lung fields likely early fibrosis.   No explanation for the patient's hiccups.   Borderline sized mediastinal lymph nodes, favor reactive.    Labs   reviewed:      Chemistry      Component Value Date/Time   NA 138 07/14/2021 0432   K 3.2 (L) 07/14/2021 0432   CL 104 07/14/2021 0432   CO2 25 07/14/2021 0432   BUN 12 07/14/2021 0432   CREATININE 1.00 08/23/2021 1259      Component Value Date/Time   CALCIUM 8.6 (L) 07/14/2021 0432   ALKPHOS 74 07/13/2021 0840   AST 23 07/13/2021 0840   ALT 17 07/13/2021 0840   BILITOT 0.9 07/13/2021 0840        Lab Results  Component Value Date   WBC 9.9 07/13/2021   HGB 13.6 07/13/2021   HCT 40.1 07/13/2021   MCV 90.5 07/13/2021   PLT 189 07/13/2021        Lab Results  Component Value Date   TSH 1.399 07/13/2021         Assessment   DOE (dyspnea on exertion) Onset spring 2012 - Echo 08/14/21  mild AS / Mild LAE/ nl R side  - CT chest  08/23/21 ? Mild early PF - trial off acei 09/10/2021 >>>  - 09/10/2021   Walked on RA x  3  lap(s) =  approx 450 @ slow pace,   with lowest 02 sats 97%  At end but doe on the 2nd lap   Symptoms are markedly disproportionate to objective findings and not clear to what extent this is actually a pulmonary  problem but pt  does appear to have difficult to sort out  respiratory symptoms of unknown origin for which  DDX  = almost all start with A and  include Adherence, Ace Inhibitors, Acid Reflux, Active Sinus Disease, Alpha 1 Antitripsin deficiency, Anxiety masquerading as Airways dz,  ABPA,  Allergy(esp in young), Aspiration (esp in elderly), Adverse effects of meds,  Active smoking or Vaping, A bunch of PE's/clot burden (a few small clots can't cause this syndrome unless there is already severe underlying pulm or vascular dz with poor reserve),  Anemia or thyroid disorder, plus two Bs  = Bronchiectasis and Beta blocker use..and one C= CHF     Adherence is always the initial "prime suspect" and is a multilayered concern that requires a "trust but verify" approach in every patient - starting with knowing how to use medications, especially inhalers, correctly, keeping up with refills and understanding the fundamental difference between maintenance and prns vs those medications only taken for a very short course and then stopped and not refilled.   ACEi adverse effects at the  top of the usual list of suspects and the only way to rule it out is a trial off > see a/p    ? Allergy ? Asthma > nothing to suggest this or COPD in this never smoker   ? Anemia/ thyroid dz > already ruled out at admit  ? Adverse drug effects > some of hi fatigue may be due to high dose lipitor??  ? Anxiety/depression/ deconditioning > usually at the bottom of this list of usual suspects but   note already on psychotropics and may interfere with adherence and also interpretation of response or lack thereof to symptom management which can be quite subjective> rx per PCP   ? chf / cardiac asthma > echo c/w just mild AS at this point     Essential hypertension D/c acei 09/10/2021  Dyspnea, dry cough   In the best review of chronic cough to date ( NEJM 2016 375 5176-1607) ,  ACEi are now felt to cause cough in up to  20% of pts which is a 4 fold  increase from previous reports and does not include the variety of non-specific complaints we see in pulmonary clinic in pts on ACEi but previously attributed to another dx like  Copd/asthma and  include PNDS, throat and chest congestion, "bronchitis", unexplained dyspnea and noct "strangling" sensations, and hoarseness, but also  atypical /refractory GERD symptoms like dysphagia and "bad heartburn"   The only way I know  to prove this is not an "ACEi Case" is a trial off ACEi x a minimum of 6 weeks then regroup.   >>> try diovan 80 mg daily and regroup in 6 weeks   Each maintenance medication was reviewed in detail including emphasizing most importantly the difference between maintenance and prns and under what circumstances the prns are to be triggered using an action plan format where appropriate.  Total time for H and P, chart review, counseling,  directly observing portions of ambulatory 02 saturation study/ and generating customized AVS unique to this office visit / same day charting = 48 min                    Christinia Gully, MD 09/10/2021

## 2021-09-10 NOTE — Assessment & Plan Note (Signed)
D/c acei 09/10/2021  Dyspnea, dry cough   In the best review of chronic cough to date ( NEJM 2016 375 [id number]) ,  ACEi are now felt to cause cough in up to  20% of pts which is a 4 fold increase from previous reports and does not include the variety of non-specific complaints we see in pulmonary clinic in pts on ACEi but previously attributed to another dx like  Copd/asthma and  include PNDS, throat and chest congestion, "bronchitis", unexplained dyspnea and noct "strangling" sensations, and hoarseness, but also  atypical /refractory GERD symptoms like dysphagia and "bad heartburn"   The only way I know  to prove this is not an "ACEi Case" is a trial off ACEi x a minimum of 6 weeks then regroup.   >>> try diovan 80 mg daily and regroup in 6 weeks   Each maintenance medication was reviewed in detail including emphasizing most importantly the difference between maintenance and prns and under what circumstances the prns are to be triggered using an action plan format where appropriate.  Total time for H and P, chart review, counseling,  directly observing portions of ambulatory 02 saturation study/ and generating customized AVS unique to this office visit / same day charting = 48 min

## 2021-09-12 DIAGNOSIS — H2513 Age-related nuclear cataract, bilateral: Secondary | ICD-10-CM | POA: Diagnosis not present

## 2021-09-12 DIAGNOSIS — H34211 Partial retinal artery occlusion, right eye: Secondary | ICD-10-CM | POA: Diagnosis not present

## 2021-09-12 DIAGNOSIS — H11002 Unspecified pterygium of left eye: Secondary | ICD-10-CM | POA: Diagnosis not present

## 2021-09-12 DIAGNOSIS — E113293 Type 2 diabetes mellitus with mild nonproliferative diabetic retinopathy without macular edema, bilateral: Secondary | ICD-10-CM | POA: Diagnosis not present

## 2021-09-12 DIAGNOSIS — H25013 Cortical age-related cataract, bilateral: Secondary | ICD-10-CM | POA: Diagnosis not present

## 2021-09-20 ENCOUNTER — Institutional Professional Consult (permissible substitution): Payer: Medicare HMO | Admitting: Internal Medicine

## 2021-09-30 DIAGNOSIS — K219 Gastro-esophageal reflux disease without esophagitis: Secondary | ICD-10-CM | POA: Diagnosis not present

## 2021-09-30 DIAGNOSIS — E1165 Type 2 diabetes mellitus with hyperglycemia: Secondary | ICD-10-CM | POA: Diagnosis not present

## 2021-10-08 DIAGNOSIS — J069 Acute upper respiratory infection, unspecified: Secondary | ICD-10-CM | POA: Diagnosis not present

## 2021-10-08 DIAGNOSIS — I255 Ischemic cardiomyopathy: Secondary | ICD-10-CM | POA: Diagnosis not present

## 2021-10-08 DIAGNOSIS — E538 Deficiency of other specified B group vitamins: Secondary | ICD-10-CM | POA: Diagnosis not present

## 2021-10-08 DIAGNOSIS — E559 Vitamin D deficiency, unspecified: Secondary | ICD-10-CM | POA: Diagnosis not present

## 2021-10-22 ENCOUNTER — Ambulatory Visit: Payer: Medicare HMO | Admitting: Cardiology

## 2021-10-28 ENCOUNTER — Encounter: Payer: Self-pay | Admitting: Internal Medicine

## 2021-10-28 ENCOUNTER — Other Ambulatory Visit: Payer: Self-pay

## 2021-10-28 ENCOUNTER — Ambulatory Visit: Payer: Medicare HMO | Admitting: Internal Medicine

## 2021-10-28 DIAGNOSIS — R519 Headache, unspecified: Secondary | ICD-10-CM

## 2021-10-28 DIAGNOSIS — R0609 Other forms of dyspnea: Secondary | ICD-10-CM | POA: Diagnosis not present

## 2021-10-28 DIAGNOSIS — R058 Other specified cough: Secondary | ICD-10-CM | POA: Insufficient documentation

## 2021-10-28 MED ORDER — FAMOTIDINE 20 MG PO TABS: ORAL_TABLET | ORAL | 11 refills | Status: DC

## 2021-10-28 NOTE — Patient Instructions (Addendum)
Add Pepcid   20 mg after supper or bedtime until return   Continue pantoprazole 40 mg Take 30-60 min before first meal of the day    Only use your albuterol as a rescue medication to be used if you can't catch your breath by resting or doing a relaxed purse lip breathing pattern.  - The less you use it, the better it will work when you need it. - Ok to use up to 2 puffs  every 4 hours if you must but call for immediate appointment if use goes up over your usual need - Don't leave home without it !!  (think of it like the spare tire for your car)   Ok to try albuterol 15 min before an activity (on alternating days)  that you know would usually make you short of breath and see if it makes any difference and if makes none then don't take albuterol after activity unless you can't catch your breath as this means it's the resting that helps, not the albuterol.  We will schedule you a head ct due to balance issues, headache and nasal congestion   Make sure you check your oxygen saturation at your highest level of activity to be sure it stays over 90% and keep track of it at least once a week, more often if breathing getting worse, and let me know if losing ground.   GERD (REFLUX)  is an extremely common cause of respiratory symptoms just like yours , many times with no obvious heartburn at all.    It can be treated with medication, but also with lifestyle changes including elevation of the head of your bed (ideally with 6-8inch blocks under the headboard of your bed),  Smoking cessation, avoidance of late meals, excessive alcohol, and avoid fatty foods, chocolate, peppermint, colas, red wine, and acidic juices such as orange juice.  NO MINT OR MENTHOL PRODUCTS SO NO COUGH DROPS  USE SUGARLESS CANDY INSTEAD (Jolley ranchers or Stover's or Life Savers) or even ice chips will also do - the key is to swallow to prevent all throat clearing. NO OIL BASED VITAMINS - use powdered substitutes.  Avoid fish oil  when coughing.   Please schedule a follow up visit in 6  months but call sooner if needed

## 2021-10-28 NOTE — Progress Notes (Signed)
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  The patient works in [employer] and works outside on a daily basis.  Apparently, the patient was noted to have some gait instability and altered mental status on 07/11/2021.  His grandson took him home, and the patient went to take a nap.  Apparently, the patient continued to have some dizziness and gait instability.  He had an episode of diaphoresis when he woke up.  As result, the patient was brought to the hospital for further evaluation.  There was concern initially that the patient had not been hydrating well.  The patient did have 2 episodes of nausea and vomiting on the day of admission.  In the emergency department, the patient had SIRS criteria with a temperature of 94.8 F, heart rate in the 40s, WBC 14.2, and lactate 2.0.  Patient was admitted for further evaluation and treatment.  He was started on intravenous antibiotics and IV fluids.  Since then, his mental status has gradually improved.  His vital signs have stabilized.   Discharge Diagnoses:    SIRS -Present on admission -Sepsis ruled out -Presented with hypothermia and leukocytosis with lactate 2.0 -Physiologically improved -Blood cultures remain negative -UA negative for pyuria -Chest x-ray without any infiltrates -Discontinue antibiotics and observe clinically -Lactic acid 2.0>>1.7 -PCT <0.10 x 3    Acute metabolic encephalopathy -Mental status has improved with hydration and empiric antibiotics -Oral intake has improved -Discontinue IV fluids and IV antibiotics and observe clinically -Serum B12--61>>>given IM inj and start po supplement -GDJ--2.426   Chronic systolic and diastolic CHF -Clinically euvolemic -01/11/2020 echo EF 45%, grade 3 DD, moderate MR, mild AS -Continue aspirin, metoprolol, Lipitor   Coronary artery disease status post CABG -No chest pain presently -Continue aspirin and Lipitor -Continue metoprolol tartrate   Lactic acidosis -Sepsis ruled out   Abnormal EKG -Patient presented with junctional rhythm and RBBB -Improved after electrolyte abnormalities and metabolic abnormalities corrected -07/12/2021 EKG--personally reviewed sinus rhythm, nonspecific T wave changes -remained on telemetry without concerning dysrhythmia   Essential hypertension -Continue metoprolol tartrate -Restart hydralazine -Continue lisinopril>>increase to 10 mg daily -Holding HCTZ>>resume after d/c   Uncontrolled diabetes mellitus type 2 with hyperglycemia -07/12/2021 hemoglobin A1c 8.3 -Holding metformin and Jardiance >>resume after d/c -Continue NovoLog sliding scale   Anxiety -His benzodiazepines were held in the setting of his altered mental status at the time of admission   History of Present Illness  09/10/2021  Pulmonary/ 1st office eval/Alejandro [name]  Chief Complaint  Patient presents with   Consult    Abnormal chest ct. Fatigue, no SOB. Persistent dry cough.   Dyspnea:  progressively worse x 6 m but limited by dizzy /sob about the same time  Cough: dry  raspy cough  x about the same time Sleep: fine 2 pillows  SABA use: none   Rec Stop lisinopril  and start valsartan 80 mg one daily in it's  place and the voice should improve and the cough should go away over several weeks  Recommend paced walking and keep track of your 02 saturation at peak exertion (not after stopping)   -once a week is enough to see if trending down (above 90% is the goal)  We will walk you slowly around the office today for a baseline to compare to next visit - Be sure to bring your cane    10/28/2021  f/u ov/[place] office/Alejandro [name] re: doe? PF / acei case maint on ppi ac qd  still some am cough/ congestion and still unsteady on his feet  Chief Complaint  Patient presents with   Follow-up    Feels some congestion and headaches. SOB and cough have improved since last visit.    Dyspnea:  able to walk to shed ok now limited by balance  Cough: gone off acei / 25%  still congested in am > white  Sleeping: flat bed/ 2 pillows no problem SABA use: 2-3 per week only p exertion, never pre-treats  02: none  Covid status: vax x 3       No obvious day to day or daytime variability or assoc excess/ purulent sputum or mucus plugs or hemoptysis or cp or chest tightness, subjective wheeze or overt sinus or hb symptoms.   Sleeping  without nocturnal  exacerbation  of respiratory  c/o's or need for noct saba. Also denies any obvious fluctuation of symptoms with weather or environmental changes or other aggravating or alleviating factors except as outlined above   No unusual exposure hx or h/o childhood pna/ asthma or knowledge of premature birth.  Current Allergies, Complete Past Medical History, Past Surgical History, Family History, and Social History were reviewed in [employer] record.  ROS  The following are not active complaints unless bolded Hoarseness, sore throat, dysphagia, dental problems, itching, sneezing,  nasal congestion or discharge of excess mucus or purulent secretions, ear ache,   fever, chills, sweats, unintended wt loss or wt gain, classically pleuritic or exertional cp,  orthopnea pnd or arm/hand swelling  or leg swelling, presyncope, palpitations, abdominal pain, anorexia, nausea, vomiting, diarrhea  or change in bowel habits or change in bladder habits,  change in stools or change in urine, dysuria, hematuria,  rash, arthralgias, visual complaints, headache L temple , numbness, weakness or ataxia or problems with walking or coordination,  change in mood or  memory.        Current Meds  Medication Sig   atorvastatin (LIPITOR) 80 MG tablet TAKE 1 TABLET BY MOUTH EVERY DAY AT 6 PM   hydrochlorothiazide (HYDRODIURIL) 25 MG tablet Take 25 mg by mouth daily.   JARDIANCE 25 MG TABS tablet Take 25 mg by mouth daily.   Melatonin 10 MG TABS Take 3 each by mouth every evening. Gummies   metFORMIN (GLUCOPHAGE) 1000 MG tablet Take 1,000 mg by mouth daily with breakfast.   metoprolol tartrate (LOPRESSOR) 50 MG tablet TAKE 1 TABLET(50 MG) BY MOUTH TWICE DAILY   pantoprazole (PROTONIX) 40 MG tablet Take 1 tablet by mouth daily.   potassium chloride (MICRO-K) 10 MEQ CR capsule Take 10 mEq by mouth daily.   valsartan (DIOVAN) 80 MG tablet Take 1 tablet (80 mg total) by mouth daily.   vitamin B-12 (CYANOCOBALAMIN) 500 MCG tablet Take 1 tablet (500 mcg total) by mouth daily.        Past Medical History:  Diagnosis Date   Aortic insufficiency    a. mild-mod by  intraop TEE 05/2018 (no aortic stenosis).   CAD (coronary artery disease)    a. NSTEMI 05/2018 with multivessel disease -> s/p CABGx4 05/11/18.   Chronic diastolic CHF (congestive heart failure) ([place])    a. dx 05/2018.   CKD (chronic kidney disease), stage III ([place])    History of nephrolithiasis 2004   Hyperlipidemia    Hypertension    IBS (irritable colon syndrome)    Postoperative atrial fibrillation (HCC)    PVC's (premature ventricular contractions)    Reflux    Type 2 diabetes mellitus ([place])        Objective:    Wt Readings from Last 3 Encounters:  10/28/21 230 lb 1.9 oz (104.4 kg)  09/10/21 230 lb 1.9 oz (104.4 kg)  07/22/21 231 lb (104.8 kg)      Vital signs reviewed  10/28/2021  - Note at rest 02 sats  98% on RA   General appearance:   amb wm, took two calls during interview/  exam/ somber affect     HEENT : pt wearing mask not removed for exam due to covid -19 concerns.    NECK :  without JVD/Nodes/TM/ nl carotid upstrokes bilaterally   LUNGS: no acc muscle use,  Nl contour chest with insp crackles in bases  bilaterally without cough on insp or exp maneuvers   CV:  RRR  no s3  2-3/6 SEM s increase in P2, and no edema   ABD:  soft and nontender with nl inspiratory excursion in the supine position. No bruits or organomegaly appreciated, bowel sounds nl  MS:  Nl gait/ ext warm without deformities, calf tenderness, cyanosis or clubbing No obvious joint restrictions   SKIN: warm and dry without lesions    NEURO:  alert, approp, nl sensorium with  no motor or cerebellar deficits apparent.     I personally reviewed images and agree with radiology impression as follows:   Chest CT w contrast 08/23/21 Peripheral interstitial thickening in the lower lung fields likely early fibrosis.       Assessment

## 2021-10-28 NOTE — Assessment & Plan Note (Signed)
Onset spring 2012 - Echo 08/14/21  mild AS / Mild LAE/ nl R side  - CT chest  08/23/21 ? Mild early PF - trial off acei 09/10/2021 >>> cough 75% better  - 09/10/2021   Walked on RA x  3  lap(s) =  approx 450 @ slow pace,   with lowest 02 sats 97%  At end but doe on the 2nd lap  - 10/28/2021   Walked on RA x  3  lap(s) =  approx 463fg @ moderate  pace, stopped due to end of study with mild sob on last lap and no ataxia noted  with lowest 02 sats 98%    No evidence of a pulmonary problem at this point though note mild IPF is likely present so rec:  Make sure you check your oxygen saturation at your highest level of activity to be sure it stays over 90% and keep track of it at least once a week, more often if breathing getting worse, and let me know if losing ground.   F/u at 6 m

## 2021-10-28 NOTE — Assessment & Plan Note (Signed)
Onset with septic type picture 07/2021 but persistent since then with ataxia/nasal congestion - CT head 10/28/2021 >>>   F/u neuro vs ENT if any pertinent findings/ o/w back to PCP for f/u as planned

## 2021-10-28 NOTE — Assessment & Plan Note (Signed)
Off acei 09/10/21 >  75% improved 10/28/2021 but still am cough - max gerd rx/ bed blocks rec 10/28/2021  - Sinus CT 10/28/2021 >>>   ACE inhibitors are problematic in  pts with airway complaints because  even experienced pulmonologists can't always distinguish ace effects from copd/asthma.  By themselves they don't actually cause a problem, much like oxygen can't by itself start a fire, but they certainly serve as a powerful catalyst or enhancer for any "fire"  or inflammatory process in the upper airway, be it caused by an ET  tube or more commonly reflux (especially in the obese or pts with known GERD or who are on biphoshonates).   Since cough is not 100% better will look at sinus source, gerd a bit harder  -  see avs for instructions unique to this ov   Each maintenance medication was reviewed in detail including emphasizing most importantly the difference between maintenance and prns and under what circumstances the prns are to be triggered using an action plan format where appropriate.  Total time for H and P, chart review, counseling, reviewing hfa device(s) , directly observing portions of ambulatory 02 saturation study/ and generating customized AVS unique to this office visit / same day charting  > 30 min

## 2021-10-30 DIAGNOSIS — E1165 Type 2 diabetes mellitus with hyperglycemia: Secondary | ICD-10-CM | POA: Diagnosis not present

## 2021-10-30 DIAGNOSIS — J069 Acute upper respiratory infection, unspecified: Secondary | ICD-10-CM | POA: Diagnosis not present

## 2021-10-30 DIAGNOSIS — K219 Gastro-esophageal reflux disease without esophagitis: Secondary | ICD-10-CM | POA: Diagnosis not present

## 2021-10-30 DIAGNOSIS — E538 Deficiency of other specified B group vitamins: Secondary | ICD-10-CM | POA: Diagnosis not present

## 2021-11-18 ENCOUNTER — Encounter: Payer: Self-pay | Admitting: Physician Assistant

## 2021-11-18 NOTE — Progress Notes (Deleted)
Cardiology Office Note:    Date:  11/18/2021   ID:  Alejandro [name], DOB September 07, 1941, MRN [id number]  PCP:  Pablo Lawrence, NP   [facility] Providers Cardiologist:  Rozann Lesches, MD { Click to update primary MD,subspecialty MD or APP then REFRESH:1}  *** Referring MD: Celene Squibb, MD   Chief Complaint:  No chief complaint on file. {Click here for Visit Info    :1}   Patient Profile:   Alejandro [name] is a 80 y.o. male with:  Past Medical History:  Diagnosis Date   AAA Ultrasound 01/11/2020    AAA [place] 01/11/20:  no abdominal aortic aneurysm, 2.4 cm   Aortic insufficiency    a. mild-mod by intraop TEE 05/2018 (no aortic stenosis) // Alejandro 2/21: mild //  Alejandro 9/22: trivial   Aortic stenosis    Alejandro 9/22: mild (mean 16) // Alejandro 2/21: mild (mean 18.7)   CAD (coronary artery disease)    a. NSTEMI 05/2018 with multivessel disease -> s/p CABGx4 05/11/18 // Myoview 06/07/20: inf scar w mild peri-infarct ischemia, EF 37; Int Risk   Carotid artery disease ([place])    PreCABG [place] 05/07/18: Bilat ICA 1-39   CKD (chronic kidney disease), stage III ([place])    HFimpEF (heart failure with improved EF)    Echocardiogram 01/11/20: EF 45, Gr 3 DD, mild AI, mild AS (mean 18.7 mmHg) // Echocardiogram 08/14/21:  EF 55-60, inf HK, mild LVH, Gr 1 DD, normal RVSF, RVSP 25.3, mild LAE, trivial MR, MAC, trivial AI, mild AS (mean 16 mmHg, Vmax 283 cm/s, DI 0.41)   History of nephrolithiasis 2004   Hyperlipidemia    Hypertension    IBS (irritable colon syndrome)    Ischemic cardiomyopathy    Mitral regurgitation    Alejandro 9/22: trivial MR   Postoperative atrial fibrillation (HCC)    PVC's (premature ventricular contractions)    Reflux    Type 2 diabetes mellitus ([place])     History of Present Illness: Alejandro [name] was last seen by Dr. [name] in 8/22 after an admission with possible sepsis with associated metabolic encephalopathy.  He had bradycardia with junctional rhythm that improved with resolution of electrolyte  abnormalities.  A f/u echocardiogram was obtained which demonstrated improved LVF with normal EF and mild aortic stenosis.  He returns for f/u.  ***      ASSESSMENT & PLAN:   No problem-specific Assessment & Plan notes found for this encounter.        {Are you ordering a CV Procedure (e.g. stress test, cath, DCCV, TEE, etc)?   Press F2        :[id number]}   Dispo:  No follow-ups on file.    Prior CV studies: *** {Select studies to display:26339}    Past Medical History:  Diagnosis Date   AAA Ultrasound 01/11/2020    AAA [place] 01/11/20:  no abdominal aortic aneurysm, 2.4 cm   Aortic insufficiency    a. mild-mod by intraop TEE 05/2018 (no aortic stenosis) // Alejandro 2/21: mild //  Alejandro 9/22: trivial   Aortic stenosis    Alejandro 9/22: mild (mean 16) // Alejandro 2/21: mild (mean 18.7)   CAD (coronary artery disease)    a. NSTEMI 05/2018 with multivessel disease -> s/p CABGx4 05/11/18 // Myoview 06/07/20: inf scar w mild peri-infarct ischemia, EF 37; Int Risk   Carotid artery disease ([place])    PreCABG [place] 05/07/18: Bilat ICA 1-39   CKD (chronic kidney disease), stage III ([place])  HFimpEF (heart failure with improved EF)    Echocardiogram 01/11/20: EF 45, Gr 3 DD, mild AI, mild AS (mean 18.7 mmHg) // Echocardiogram 08/14/21:  EF 55-60, inf HK, mild LVH, Gr 1 DD, normal RVSF, RVSP 25.3, mild LAE, trivial MR, MAC, trivial AI, mild AS (mean 16 mmHg, Vmax 283 cm/s, DI 0.41)   History of nephrolithiasis 2004   Hyperlipidemia    Hypertension    IBS (irritable colon syndrome)    Ischemic cardiomyopathy    Mitral regurgitation    Alejandro 9/22: trivial MR   Postoperative atrial fibrillation (HCC)    PVC's (premature ventricular contractions)    Reflux    Type 2 diabetes mellitus ([place])    Current Medications: No outpatient medications have been marked as taking for the 11/19/21 encounter (Appointment) with Richardson Dopp T, PA-C.    Allergies:   Patient has no known allergies.   Social History   Tobacco Use    Smoking status: Never   Smokeless tobacco: Never  Vaping Use   Vaping Use: Never used  Substance Use Topics   Alcohol use: No    Alcohol/week: 0.0 standard drinks   Drug use: No    Family Hx: The patient's family history includes Arthritis in an other family member; Asthma in an other family member; Diabetes in an other family member; Diabetes Mellitus II in his mother; Heart attack in his brother and father; Heart disease in an other family member.  ROS   EKGs/Labs/Other Test Reviewed:    EKG:  EKG is *** ordered today.  The ekg ordered today demonstrates ***  Recent Labs: 07/13/2021: ALT 17; Hemoglobin 13.6; Platelets 189; TSH 1.399 07/14/2021: BUN 12; Magnesium 2.0; Potassium 3.2; Sodium 138 08/23/2021: Creatinine, Ser 1.00   Recent Lipid Panel No results found for: CHOL, TRIG, HDL, LDLCALC, LDLDIRECT   Risk Assessment/Calculations:   {Does this patient have ATRIAL FIBRILLATION?:[id number]}      Physical Exam:    VS:  There were no vitals taken for this visit.    Wt Readings from Last 3 Encounters:  10/28/21 230 lb 1.9 oz (104.4 kg)  09/10/21 230 lb 1.9 oz (104.4 kg)  07/22/21 231 lb (104.8 kg)    Physical Exam ***    Medication Adjustments/Labs and Tests Ordered: Current medicines are reviewed at length with the patient today.  Concerns regarding medicines are outlined above.  Tests Ordered: No orders of the defined types were placed in this encounter.  Medication Changes: No orders of the defined types were placed in this encounter.  Signed, Richardson Dopp, PA-C  11/18/2021 5:44 PM    [facility] [address], [place], [place]  [address] Phone: [phone]; Fax: [phone]

## 2021-11-19 ENCOUNTER — Ambulatory Visit: Payer: Medicare HMO | Admitting: Physician Assistant

## 2021-12-11 DIAGNOSIS — M2550 Pain in unspecified joint: Secondary | ICD-10-CM | POA: Diagnosis not present

## 2021-12-20 DIAGNOSIS — Z7984 Long term (current) use of oral hypoglycemic drugs: Secondary | ICD-10-CM | POA: Diagnosis not present

## 2021-12-20 DIAGNOSIS — R918 Other nonspecific abnormal finding of lung field: Secondary | ICD-10-CM | POA: Diagnosis not present

## 2021-12-20 DIAGNOSIS — E1165 Type 2 diabetes mellitus with hyperglycemia: Secondary | ICD-10-CM | POA: Diagnosis not present

## 2021-12-20 DIAGNOSIS — R001 Bradycardia, unspecified: Secondary | ICD-10-CM | POA: Diagnosis not present

## 2021-12-20 DIAGNOSIS — I2510 Atherosclerotic heart disease of native coronary artery without angina pectoris: Secondary | ICD-10-CM | POA: Diagnosis not present

## 2021-12-20 DIAGNOSIS — Z79899 Other long term (current) drug therapy: Secondary | ICD-10-CM | POA: Diagnosis not present

## 2021-12-20 DIAGNOSIS — Z951 Presence of aortocoronary bypass graft: Secondary | ICD-10-CM | POA: Diagnosis not present

## 2021-12-20 DIAGNOSIS — I517 Cardiomegaly: Secondary | ICD-10-CM | POA: Diagnosis not present

## 2021-12-20 DIAGNOSIS — J984 Other disorders of lung: Secondary | ICD-10-CM | POA: Diagnosis not present

## 2021-12-20 DIAGNOSIS — I350 Nonrheumatic aortic (valve) stenosis: Secondary | ICD-10-CM | POA: Diagnosis not present

## 2021-12-20 DIAGNOSIS — I5042 Chronic combined systolic (congestive) and diastolic (congestive) heart failure: Secondary | ICD-10-CM | POA: Diagnosis not present

## 2021-12-20 DIAGNOSIS — I110 Hypertensive heart disease with heart failure: Secondary | ICD-10-CM | POA: Diagnosis not present

## 2021-12-20 DIAGNOSIS — E538 Deficiency of other specified B group vitamins: Secondary | ICD-10-CM | POA: Diagnosis not present

## 2021-12-25 ENCOUNTER — Telehealth: Payer: Self-pay | Admitting: Cardiology

## 2021-12-25 DIAGNOSIS — I10 Essential (primary) hypertension: Secondary | ICD-10-CM | POA: Diagnosis not present

## 2021-12-25 DIAGNOSIS — R001 Bradycardia, unspecified: Secondary | ICD-10-CM | POA: Diagnosis not present

## 2021-12-25 DIAGNOSIS — J189 Pneumonia, unspecified organism: Secondary | ICD-10-CM | POA: Diagnosis not present

## 2021-12-25 NOTE — Telephone Encounter (Signed)
Spoke with Pablo Lawrence, NP who states that pt was seen in office by another provider the other day and noted to have [place]. Of 35. Pt was sent to [facility] ED. Pt was d/c with the instruction to cut Lopressor to 25 mg Two Times Daily. Pt was seen today by Loma Sousa and noted to have [place] of 40. She stopped Lopressor and reduced Valsartan to 40 mg daily. She is requesting appt ASAP for the pt. Next available given on 01/07/22 with Ermalinda Barrios, PA-C. Please advise.

## 2021-12-25 NOTE — Telephone Encounter (Signed)
°  STAT if HR is under 50 or over 120 (normal HR is 60-100 beats per minute)  What is your heart rate? 31-40  Do you have a log of your heart rate readings (document readings)?   Do you have any other symptoms? Pcp calling reporting pt's HR is low, pt went to ED but they did not keep pt, she is worried about pt's very low HR

## 2021-12-25 NOTE — Telephone Encounter (Signed)
EKG's requested and received from [facility].

## 2021-12-31 DIAGNOSIS — I10 Essential (primary) hypertension: Secondary | ICD-10-CM | POA: Diagnosis not present

## 2021-12-31 NOTE — Progress Notes (Signed)
Cardiology Office Note    Date:  12/31/2021   ID:  [name], Alejandro [name], MRN [id number]   PCP:  Pablo Lawrence, NP   [facility]  Cardiologist:  Rozann Lesches, MD   Advanced Practice Provider:  No care team member to display Electrophysiologist:  None   [phone]   Chief Complaint  Patient presents with   Shortness of Breath    History of Present Illness:  Alejandro [name] is a 81 y.o. male with history of CAD CABG 2019, ICM LVEF 45%, combined CHF, mild AS, CKD stage 3b, mod MR.  Patient saw Dr. [name] 07/2021 who ordered f/u echo. Echo 08/14/21 normal LVEF 55-60% grade 1 DD, mild AS.  Patient  sent to the ED [facility] 12/20/21 by PCP with weakness and HR in 30's and metoprolol reduced 25 mg bid for bradycardia. It was then stopped by PCP at f/u 12/25/21 for HR in 40's and valsartan decreased. Also treated for pneumonia.Was seen again  12/31/21 because felt bad 30 min after taking meds. HR 51. He was taking valsartan 129m  daily instead of 40 mg that it was lowered to. He was also taking double HCTZ that he should have been. Labs that day Crt 1.40(was 1.10 12/20/21), glucose 246, Hbg 13.3. Vit D was low 12/11/21.  Patient comes in for f/u. Feels terrible. Short of breath the past 3 days. Eats a lot of canned soups. Says he has dizziness and is off balance. Didn't bring his meds in and says he's gotten them straight but doesn't know any doses.  Past Medical History:  Diagnosis Date   AAA Ultrasound 01/11/2020    AAA [place]2/10/21:  no abdominal aortic aneurysm, 2.4 cm   Aortic insufficiency    a. mild-mod by intraop TEE 05/2018 (no aortic stenosis) // Echo 2/21: mild //  Echo 9/22: trivial   Aortic stenosis    Echo 9/22: mild (mean 16) // Echo 2/21: mild (mean 18.7)   CAD (coronary artery disease)    a. NSTEMI 05/2018 with multivessel disease -> s/p CABGx4 05/11/18 // Myoview 06/07/20: inf scar w mild peri-infarct ischemia, EF 37; Int Risk   Carotid artery  disease ([place]    PreCABG [place]7/19: Bilat ICA 1-39   CKD (chronic kidney disease), stage III ([place]    HFimpEF (heart failure with improved EF)    Echocardiogram 01/11/20: EF 45, Gr 3 DD, mild AI, mild AS (mean 18.7 mmHg) // Echocardiogram 08/14/21:  EF 55-60, inf HK, mild LVH, Gr 1 DD, normal RVSF, RVSP 25.3, mild LAE, trivial MR, MAC, trivial AI, mild AS (mean 16 mmHg, Vmax 283 cm/s, DI 0.41)   History of nephrolithiasis 2004   Hyperlipidemia    Hypertension    IBS (irritable colon syndrome)    Ischemic cardiomyopathy    Mitral regurgitation    Echo 9/22: trivial MR   Postoperative atrial fibrillation (HCC)    PVC's (premature ventricular contractions)    Reflux    Type 2 diabetes mellitus ([place]     Past Surgical History:  Procedure Laterality Date   CORONARY ARTERY BYPASS GRAFT N/A 05/11/2018   Procedure: CORONARY ARTERY BYPASS GRAFTING (CABG), on pump, times four, using left internal mammary artery and endoscopically harvested right greater saphenous leg vein.;  Surgeon: VIvin Poot MD;  [place]  Service: Open Heart Surgery;  Laterality: N/A;   ESOPHAGOGASTRODUODENOSCOPY  02/04/2012   Procedure: ESOPHAGOGASTRODUODENOSCOPY (EGD);  Surgeon: NRogene Houston MD;  [facility];  Service: Endoscopy;  Laterality: N/A;  100   LEFT HEART CATH AND CORONARY ANGIOGRAPHY N/A 05/04/2018   Procedure: LEFT HEART CATH AND CORONARY ANGIOGRAPHY;  Surgeon: Belva Crome, MD;  Location: [facility];  Service: Cardiovascular;  Laterality: N/A;   NASAL FRACTURE SURGERY     TEE WITHOUT CARDIOVERSION N/A 05/11/2018   Procedure: TRANSESOPHAGEAL ECHOCARDIOGRAM (TEE);  Surgeon: Prescott Gum, Collier Salina, MD;  Location: [place];  Service: Open Heart Surgery;  Laterality: N/A;    Current Medications: No outpatient medications have been marked as taking for the 01/07/22 encounter (Appointment) with Imogene Burn, PA-C.     Allergies:   Patient has no known allergies.   Social History    Socioeconomic History   Marital status: Widowed    Spouse name: Not on file   Number of children: Not on file   Years of education: Not on file   Highest education level: Not on file  Occupational History   Not on file  Tobacco Use   Smoking status: Never   Smokeless tobacco: Never  Vaping Use   Vaping Use: Never used  Substance and Sexual Activity   Alcohol use: No    Alcohol/week: 0.0 standard drinks   Drug use: No   Sexual activity: Yes  Other Topics Concern   Not on file  Social History Narrative   Not on file   Social Determinants of Health   Financial Resource Strain: Not on file  Food Insecurity: Not on file  Transportation Needs: Not on file  Physical Activity: Not on file  Stress: Not on file  Social Connections: Not on file     Family History:  The patient's  family history includes Arthritis in an other family member; Asthma in an other family member; Diabetes in an other family member; Diabetes Mellitus II in his mother; Heart attack in his brother and father; Heart disease in an other family member.   ROS:   Please see the history of present illness.    ROS All other systems reviewed and are negative.   PHYSICAL EXAM:   VS:  BP (!) 160/82    Pulse 78    Ht _0  (1.803 m)    Wt 232 lb 12.8 oz (105.6 kg)    SpO2 98%    BMI 32.47 kg/m   Physical Exam  GEN: Obese, in no acute distress  Neck: no JVD, carotid bruits, or masses Cardiac:RRR; 2/6 systolic murmur apex, LSB Respiratory:  clear to auscultation bilaterally, normal work of breathing GI: soft, nontender, nondistended, + BS Ext: without cyanosis, clubbing, or edema, Good distal pulses bilaterally Neuro:  Alert and Oriented x 3 Psych: euthymic mood, full affect  Wt Readings from Last 3 Encounters:  10/28/21 230 lb 1.9 oz (104.4 kg)  09/10/21 230 lb 1.9 oz (104.4 kg)  07/22/21 231 lb (104.8 kg)      Studies/Labs Reviewed:   EKG:  EKG is not ordered today.     Recent Labs: 07/13/2021:  ALT 17; Hemoglobin 13.6; Platelets 189; TSH 1.399 07/14/2021: BUN 12; Magnesium 2.0; Potassium 3.2; Sodium 138 08/23/2021: Creatinine, Ser 1.00   Lipid Panel No results found for: CHOL, TRIG, HDL, CHOLHDL, VLDL, LDLCALC, LDLDIRECT  Additional studies/ records that were reviewed today include:  Echo 08/14/21 IMPRESSIONS     1. Left ventricular ejection fraction, by estimation, is 55 to 60%. The  left ventricle has normal function. The left ventricle demonstrates  regional wall motion abnormalities (  see scoring diagram/findings for  description). The left ventricular internal  cavity size was mildly dilated. There is mild left ventricular  hypertrophy. Left ventricular diastolic parameters are consistent with  Grade I diastolic dysfunction (impaired relaxation).   2. Right ventricular systolic function is normal. The right ventricular  size is normal. There is normal pulmonary artery systolic pressure. The  estimated right ventricular systolic pressure is 16.1 mmHg.   3. Left atrial size was mildly dilated.   4. Right atrial size was upper normal.   5. The mitral valve is abnormal. Trivial mitral valve regurgitation.  Moderate to severe mitral annular calcification.   6. The aortic valve is abnormal. There is moderate calcification of the  aortic valve. Aortic valve regurgitation is trivial. Mild aortic valve  stenosis. Aortic valve mean gradient measures 16.0 mmHg. Aortic valve Vmax  measures 2.83 m/s.   7. The inferior vena cava is normal in size with greater than 50%  respiratory variability, suggesting right atrial pressure of 3 mmHg.   Comparison(s): Prior images reviewed side by side. LVEF has improved and  there has been no significant progression in degree of aortic stenosis.  Echocardiogram 01/11/2020:  1. Left ventricular ejection fraction, by estimation, is 45%. The left  ventricle has mildly decreased function. The left ventrical demonstrates  global hypokinesis, more  prominent in the basal inferior wall. The left  ventricular internal cavity size was  moderately dilated. There is mildly increased left ventricular  hypertrophy. Left ventricular diastolic parameters are consistent with  Grade III diastolic dysfunction (restrictive).   2. Right ventricular systolic function is low normal. The right  ventricular size is normal. There is moderately elevated pulmonary artery  systolic pressure. The estimated right ventricular systolic pressure is  09.6 mmHg.   3. Left atrial size was severely dilated.   4. Right atrial size was mildly dilated.   5. The mitral valve is grossly normal. Moderate mitral valve  regurgitation.   6. The aortic valve is abnormal, appears to be functionally bicuspid.  Aortic valve regurgitation is mild. Mild aortic valve stenosis. Aortic  valve area, by VTI measures 1.40 cm. Aortic valve mean gradient measures  18.7 mmHg.   7. Aortic dilatation noted. There is mild dilatation of the aortic root  measuring 43 mm.   8. The inferior vena cava is normal in size with greater than 50%  respiratory variability, suggesting right atrial pressure of 3 mmHg.    Lexiscan Myoview 06/07/2020: There was no ST segment deviation noted during stress. Findings consistent with prior inferior myocardial infarction with mild peri-infarct ischemia. This is an intermediate risk study. Risk based primarily on decreased LVEF, from perfusion standpoint alone low risk with only mild peri-infarct ischemia. Consider correlating LVEF with echo to better clarify risk. The left ventricular ejection fraction is moderately decreased (37%).   Chest x-ray 07/12/2019: FINDINGS: Median sternotomy wires and mediastinal surgical clips are again noted.   The heart is enlarged, not significantly changed. The mediastinal contours are otherwise within normal limits.   There is vascular congestion without frank pulmonary edema. There is no focal consolidation. There is  no significant pleural effusion. There is no appreciable pneumothorax.   The bones are unremarkable.   IMPRESSION: Cardiomegaly and  vascular congestion without frank pulmonary edema.    Risk Assessment/Calculations:         ASSESSMENT:    No diagnosis found.   PLAN:  In order of problems listed above:  Bradycardia HR in 30's metoprolol stopped by  PCP and HR 78 today but some skipping. Feels terrible, dizzy with standing up, head congestion. Not orthostatic. Will place a ZIO.   CAD S/P CABG 2019-no chest pain but DOE with walking. Will check lexi-last one 2021 low risk   ICM EF improved on echo 08/14/21 LVEF 55-60% with grade 3 DD now with worsening DOE and elevated BP since metoprolol stopped and valsartan decreased. suspect diastolic CHF but no edema on exam. Getting a lot of extra salt in diet. Check bmet and BNP.   HTN BP high today, valsartan decreased to 40 mg by PCP(was taking 120 mg) and Crt up 1.4. patient unsure of doses of meds and eats canned soup daily. 2 gm sodium diet. Not orthostatic. Will await labs, lexi and monitor to make any changes.  Shared Decision Making/Informed Consent        Medication Adjustments/Labs and Tests Ordered: Current medicines are reviewed at length with the patient today.  Concerns regarding medicines are outlined above.  Medication changes, Labs and Tests ordered today are listed in the Patient Instructions below. There are no Patient Instructions on file for this visit.   Sumner Boast, PA-C  12/31/2021 1:09 PM    [facility] [place], [place], [place]  [id number] Phone: [phone]; Fax: [phone]

## 2022-01-07 ENCOUNTER — Encounter: Payer: Self-pay | Admitting: *Deleted

## 2022-01-07 ENCOUNTER — Ambulatory Visit: Payer: Medicare HMO | Admitting: Physician Assistant

## 2022-01-07 ENCOUNTER — Encounter: Payer: Self-pay | Admitting: Physician Assistant

## 2022-01-07 ENCOUNTER — Ambulatory Visit (INDEPENDENT_AMBULATORY_CARE_PROVIDER_SITE_OTHER): Payer: Medicare HMO

## 2022-01-07 VITALS — BP 160/82 | HR 78 | Ht 71.0 in | Wt 232.8 lb

## 2022-01-07 DIAGNOSIS — R001 Bradycardia, unspecified: Secondary | ICD-10-CM

## 2022-01-07 DIAGNOSIS — I25810 Atherosclerosis of coronary artery bypass graft(s) without angina pectoris: Secondary | ICD-10-CM

## 2022-01-07 DIAGNOSIS — I350 Nonrheumatic aortic (valve) stenosis: Secondary | ICD-10-CM

## 2022-01-07 DIAGNOSIS — R0609 Other forms of dyspnea: Secondary | ICD-10-CM | POA: Diagnosis not present

## 2022-01-07 DIAGNOSIS — I10 Essential (primary) hypertension: Secondary | ICD-10-CM

## 2022-01-07 DIAGNOSIS — I255 Ischemic cardiomyopathy: Secondary | ICD-10-CM | POA: Diagnosis not present

## 2022-01-07 MED ORDER — VALSARTAN 40 MG PO TABS
40.0000 mg | ORAL_TABLET | Freq: Every day | ORAL | 11 refills | Status: DC
Start: 2022-01-07 — End: 2023-02-17

## 2022-01-07 NOTE — Patient Instructions (Signed)
Medication Instructions:  Stop Taking Metoprolol Tartrate  Decrease Valsartan to 40 mg Daily   *If you need a refill on your cardiac medications before your next appointment, please call your pharmacy*   Lab Work: Your physician recommends that you return for lab work in: Today   If you have labs (blood work) drawn today and your tests are completely normal, you will receive your results only by: [place] (if you have MyChart) OR A paper copy in the mail If you have any lab test that is abnormal or we need to change your treatment, we will call you to review the results.   Testing/Procedures: Your physician has requested that you have a lexiscan myoview. For further information please visit [email or web address]. Please follow instruction sheet, as given.  ZIO XT- Long Term Monitor Instructions   Your physician has requested you wear your ZIO patch monitor___14__days.  You may remove monitor on 01/21/22  This is a single patch monitor.  [employer] supplies one patch monitor per enrollment.  Additional stickers are not available.   Please do not apply patch if you will be having a Nuclear Stress Test, Echocardiogram, Cardiac CT, MRI, or Chest Xray during the time frame you would be wearing the monitor. The patch cannot be worn during these tests.  You cannot remove and re-apply the ZIO XT patch monitor.   Your ZIO patch monitor will be sent USPS Priority mail from [facility] directly to your home address. The monitor may also be mailed to a PO BOX if home delivery is not available.   It may take 3-5 days to receive your monitor after you have been enrolled.   Once you have received you monitor, please review enclosed instructions.  Your monitor has already been registered assigning a specific monitor serial # to you.   Applying the monitor   Shave hair from upper left chest.   Hold abrader disc by orange tab.  Rub abrader in 40 strokes over left upper chest as indicated  in your monitor instructions.   Clean area with 4 enclosed alcohol pads .  Use all pads to assure are is cleaned thoroughly.  Let dry.   Apply patch as indicated in monitor instructions.  Patch will be place under collarbone on left side of chest with arrow pointing upward.   Rub patch adhesive wings for 2 minutes.Remove white label marked "1".  Remove white label marked "2".  Rub patch adhesive wings for 2 additional minutes.   While looking in a mirror, press and release button in center of patch.  A small green light will flash 3-4 times .  This will be your only indicator the monitor has been turned on.     Do not shower for the first 24 hours.  You may shower after the first 24 hours.   Press button if you feel a symptom. You will hear a small click.  Record Date, Time and Symptom in the Patient Log Book.   When you are ready to remove patch, follow instructions on last 2 pages of Patient Log Book.  Stick patch monitor onto last page of Patient Log Book.   Place Patient Log Book in [place] box.  Use locking tab on box and tape box closed securely.  The [employer] has [employer] on it.  Please place in mailbox as soon as possible.  Your physician should have your test results approximately 7 days after the monitor has been mailed back to [facility].  Call [place] at [phone] if you have questions regarding your ZIO XT patch monitor.  Call them immediately if you see an orange light blinking on your monitor.   If your monitor falls off in less than 4 days contact our Monitor department at [phone].  If your monitor becomes loose or falls off after 4 days call [employer] at [phone] for suggestions on securing your monitor.     Follow-Up: At [facility], you and your health needs are our priority.  As part of our continuing mission to provide you with exceptional heart care, we have created designated Provider Care Teams.  These Care  Teams include your primary Cardiologist (physician) and Advanced Practice Providers (APPs -  Physician Assistants and Nurse Practitioners) who all work together to provide you with the care you need, when you need it.  We recommend signing up for the patient portal called "MyChart".  Sign up information is provided on this After Visit Summary.  MyChart is used to connect with patients for Virtual Visits (Telemedicine).  Patients are able to view lab/test results, encounter notes, upcoming appointments, etc.  Non-urgent messages can be sent to your provider as well.   To learn more about what you can do with MyChart, go to [email or web address].    Your next appointment:   4-6 week(s)  The format for your next appointment:   In Person  Provider:   Rozann Lesches, MD    Other Instructions Thank you for choosing [facility]!  Two Gram Sodium Diet 2000 mg  What is Sodium? Sodium is a mineral found naturally in many foods. The most significant source of sodium in the diet is table salt, which is about 40% sodium.  Processed, convenience, and preserved foods also contain a large amount of sodium.  The body needs only 500 mg of sodium daily to function,  A normal diet provides more than enough sodium even if you do not use salt.  Why Limit Sodium? A build up of sodium in the body can cause thirst, increased blood pressure, shortness of breath, and water retention.  Decreasing sodium in the diet can reduce edema and risk of heart attack or stroke associated with high blood pressure.  Keep in mind that there are many other factors involved in these health problems.  Heredity, obesity, lack of exercise, cigarette smoking, stress and what you eat all play a role.  General Guidelines: Do not add salt at the table or in cooking.  One teaspoon of salt contains over 2 grams of sodium. Read food labels Avoid processed and convenience foods Ask your dietitian before eating any foods not  dicussed in the menu planning guidelines Consult your physician if you wish to use a salt substitute or a sodium containing medication such as antacids.  Limit milk and milk products to 16 oz (2 cups) per day.  Shopping Hints: READ LABELS!! "Dietetic" does not necessarily mean low sodium. Salt and other sodium ingredients are often added to foods during processing.    Menu Planning Guidelines Food Group Choose More Often Avoid  Beverages (see also the milk group All fruit juices, low-sodium, salt-free vegetables juices, low-sodium carbonated beverages Regular vegetable or tomato juices, commercially softened water used for drinking or cooking  Breads and Cereals Enriched white, wheat, rye and pumpernickel bread, hard rolls and dinner rolls; muffins, cornbread and waffles; most dry cereals, cooked cereal without added salt; unsalted crackers and breadsticks; low sodium or homemade bread crumbs Bread, rolls and  crackers with salted tops; quick breads; instant hot cereals; pancakes; commercial bread stuffing; self-rising flower and biscuit mixes; regular bread crumbs or cracker crumbs  Desserts and Sweets Desserts and sweets mad with mild should be within allowance Instant pudding mixes and cake mixes  Fats Butter or margarine; vegetable oils; unsalted salad dressings, regular salad dressings limited to 1 Tbs; light, sour and heavy cream Regular salad dressings containing bacon fat, bacon bits, and salt pork; snack dips made with instant soup mixes or processed cheese; salted nuts  Fruits Most fresh, frozen and canned fruits Fruits processed with salt or sodium-containing ingredient (some dried fruits are processed with sodium sulfites        Vegetables Fresh, frozen vegetables and low- sodium canned vegetables Regular canned vegetables, sauerkraut, pickled vegetables, and others prepared in brine; frozen vegetables in sauces; vegetables seasoned with ham, bacon or salt pork  Condiments, Sauces,  Miscellaneous  Salt substitute with physician's approval; pepper, herbs, spices; vinegar, lemon or lime juice; hot pepper sauce; garlic powder, onion powder, low sodium soy sauce (1 Tbs.); low sodium condiments (ketchup, chili sauce, mustard) in limited amounts (1 tsp.) fresh ground horseradish; unsalted tortilla chips, pretzels, potato chips, popcorn, salsa (1/4 cup) Any seasoning made with salt including garlic salt, celery salt, onion salt, and seasoned salt; sea salt, rock salt, kosher salt; meat tenderizers; monosodium glutamate; mustard, regular soy sauce, barbecue, sauce, chili sauce, teriyaki sauce, steak sauce, Worcestershire sauce, and most flavored vinegars; canned gravy and mixes; regular condiments; salted snack foods, olives, picles, relish, horseradish sauce, catsup   Food preparation: Try these seasonings Meats:    Pork Sage, onion Serve with applesauce  Chicken Poultry seasoning, thyme, parsley Serve with cranberry sauce  Lamb Curry powder, rosemary, garlic, thyme Serve with mint sauce or jelly  Veal Marjoram, basil Serve with current jelly, cranberry sauce  Beef Pepper, bay leaf Serve with dry mustard, unsalted chive butter  Fish [place] leaf, dill Serve with unsalted lemon butter, unsalted parsley butter  Vegetables:    Asparagus Lemon juice   Broccoli Lemon juice   Carrots Mustard dressing parsley, mint, nutmeg, glazed with unsalted butter and sugar   Green beans Marjoram, lemon juice, nutmeg,dill seed   Tomatoes Basil, marjoram, onion   Spice /blend for [employer]" 4 tsp ground thyme 1 tsp ground sage 3 tsp ground rosemary 4 tsp ground marjoram   Test your knowledge A product that says "Salt Free" may still contain sodium. True or False Garlic Powder and Hot Pepper Sauce an be used as alternative seasonings.True or False Processed foods have more sodium than fresh foods.  True or False Canned Vegetables have less sodium than froze True or False   WAYS TO DECREASE YOUR  SODIUM INTAKE Avoid the use of added salt in cooking and at the table.  Table salt (and other prepared seasonings which contain salt) is probably one of the greatest sources of sodium in the diet.  Unsalted foods can gain flavor from the sweet, sour, and butter taste sensations of herbs and spices.  Instead of using salt for seasoning, try the following seasonings with the foods listed.  Remember: how you use them to enhance natural food flavors is limited only by your creativity... Allspice-Meat, fish, eggs, fruit, peas, red and yellow vegetables Almond Extract-Fruit baked goods Anise Seed-Sweet breads, fruit, carrots, beets, cottage cheese, cookies (tastes like licorice) Basil-Meat, fish, eggs, vegetables, rice, vegetables salads, soups, sauces Bay Leaf-Meat, fish, stews, poultry Burnet-Salad, vegetables (cucumber-like flavor) Caraway Seed-Bread, cookies, cottage  cheese, meat, vegetables, cheese, rice Cardamon-Baked goods, fruit, soups Celery Powder or seed-Salads, salad dressings, sauces, meatloaf, soup, bread.Do not use  celery salt Chervil-Meats, salads, fish, eggs, vegetables, cottage cheese (parsley-like flavor) Chili Power-Meatloaf, chicken cheese, corn, eggplant, egg dishes Chives-Salads cottage cheese, egg dishes, soups, vegetables, sauces Cilantro-Salsa, casseroles Cinnamon-Baked goods, fruit, pork, lamb, chicken, carrots Cloves-Fruit, baked goods, fish, pot roast, green beans, beets, carrots Coriander-Pastry, cookies, meat, salads, cheese (lemon-orange flavor) Cumin-Meatloaf, fish,cheese, eggs, cabbage,fruit pie (caraway flavor) [employer], fruit, eggs, fish, poultry, cottage cheese, vegetables Dill Seed-Meat, cottage cheese, poultry, vegetables, fish, salads, bread Fennel Seed-Bread, cookies, apples, pork, eggs, fish, beets, cabbage, cheese, Licorice-like flavor Garlic-(buds or powder) Salads, meat, poultry, fish, bread, butter, vegetables, potatoes.Do not  use garlic  salt Ginger-Fruit, vegetables, baked goods, meat, fish, poultry Horseradish Root-Meet, vegetables, butter Lemon Juice or Extract-Vegetables, fruit, tea, baked goods, fish salads Mace-Baked goods fruit, vegetables, fish, poultry (taste like nutmeg) Maple Extract-Syrups Marjoram-Meat, chicken, fish, vegetables, breads, green salads (taste like Sage) Mint-Tea, lamb, sherbet, vegetables, desserts, carrots, cabbage Mustard, Dry or Seed-Cheese, eggs, meats, vegetables, poultry Nutmeg-Baked goods, fruit, chicken, eggs, vegetables, desserts Onion Powder-Meat, fish, poultry, vegetables, cheese, eggs, bread, rice salads (Do not use   Onion salt) Orange Extract-Desserts, baked goods Oregano-Pasta, eggs, cheese, onions, pork, lamb, fish, chicken, vegetables, green salads Paprika-Meat, fish, poultry, eggs, cheese, vegetables Parsley Flakes-Butter, vegetables, meat fish, poultry, eggs, bread, salads (certain forms may   Contain sodium Pepper-Meat fish, poultry, vegetables, eggs Peppermint Extract-Desserts, baked goods Poppy Seed-Eggs, bread, cheese, fruit dressings, baked goods, noodles, vegetables, cottage  [employer], poultry, meat, fish, cauliflower, turnips,eggs bread Saffron-Rice, bread, veal, chicken, fish, eggs Sage-Meat, fish, poultry, onions, eggplant, tomateos, pork, stews Savory-Eggs, salads, poultry, meat, rice, vegetables, soups, pork Tarragon-Meat, poultry, fish, eggs, butter, vegetables (licorice-like flavor)  Thyme-Meat, poultry, fish, eggs, vegetables, (clover-like flavor), sauces, soups Tumeric-Salads, butter, eggs, fish, rice, vegetables (saffron-like flavor) Vanilla Extract-Baked goods, candy Vinegar-Salads, vegetables, meat marinades Walnut Extract-baked goods, candy   2. Choose your Foods Wisely   The following is a list of foods to avoid which are high in sodium:  Meats-Avoid all smoked, canned, salt cured, dried and kosher meat  and fish as well as Anchovies   [employer] meats:Bologna, Liverwurst, Pastrami Canned meat or fish  Marinated herring Caviar    Pepperoni Corned Beef   Pizza Dried chipped beef  Salami Frozen breaded fish or meat Salt pork Frankfurters or hot dogs  Sardines Gefilte fish   Sausage Ham (boiled ham, Proscuitto Smoked butt    spiced ham)   Spam      TV Dinners Vegetables Canned vegetables (Regular) Relish Canned mushrooms  Sauerkraut Olives    Tomato juice Pickles  Bakery and Dessert Products Canned puddings  Cream pies Cheesecake   Decorated cakes Cookies  Beverages/Juices Tomato juice, regular  Gatorade   V-8 vegetable juice, regular  Breads and Cereals Biscuit mixes   Salted potato chips, corn chips, pretzels Bread stuffing mixes  Salted crackers and rolls Pancake and waffle mixes Self-rising flour  Seasonings Accent    Meat sauces Barbecue sauce  Meat tenderizer Catsup    Monosodium glutamate (MSG) Celery salt   Onion salt Chili sauce   Prepared mustard Garlic salt   Salt, seasoned salt, sea salt Gravy mixes   Soy sauce Horseradish   Steak sauce Ketchup   Tartar sauce Lite salt    Teriyaki sauce Marinade mixes   Worcestershire sauce  Others Baking powder  Cocoa and cocoa mixes Baking soda   Commercial casserole mixes Candy-caramels, chocolate  Dehydrated soups    Bars, fudge,nougats  Instant rice and pasta mixes Canned broth or soup  Maraschino cherries Cheese, aged and processed cheese and cheese spreads  Learning Assessment Quiz  Indicated T (for True) or F (for False) for each of the following statements:  _____ Fresh fruits and vegetables and unprocessed grains are generally low in sodium _____ Water may contain a considerable amount of sodium, depending on the source _____ You can always tell if a food is high in sodium by tasting it _____ Certain laxatives my be high in sodium and should be avoided unless prescribed   by a physician or  pharmacist _____ Salt substitutes may be used freely by anyone on a sodium restricted diet _____ Sodium is present in table salt, food additives and as a natural component of   most foods _____ Table salt is approximately 90% sodium _____ Limiting sodium intake may help prevent excess fluid accumulation in the body _____ On a sodium-restricted diet, seasonings such as bouillon soy sauce, and    cooking wine should be used in place of table salt _____ On an ingredient list, a product which lists monosodium glutamate as the first   ingredient is an appropriate food to include on a low sodium diet  Circle the best answer(s) to the following statements (Hint: there may be more than one correct answer)  11. On a low-sodium diet, some acceptable snack items are:    A. Olives  F. Bean dip   K. Grapefruit juice    B. Salted Pretzels G. Commercial Popcorn   L. Canned peaches    C. Carrot Sticks  H. Bouillon   M. Unsalted nuts   D. [place] fries  I. Peanut butter crackers N. Salami   E. Sweet pickles J. Tomato Juice   O. Pizza  12.  Seasonings that may be used freely on a reduced - sodium diet include   A. Lemon wedges F.Monosodium glutamate K. Celery seed    B.Soysauce   G. Pepper   L. Mustard powder   C. Sea salt  H. Cooking wine  M. Onion flakes   D. Vinegar  E. Prepared horseradish N. Salsa   E. Sage   J. Worcestershire sauce  O. Chutney

## 2022-01-09 DIAGNOSIS — J189 Pneumonia, unspecified organism: Secondary | ICD-10-CM | POA: Diagnosis not present

## 2022-01-09 DIAGNOSIS — E538 Deficiency of other specified B group vitamins: Secondary | ICD-10-CM | POA: Diagnosis not present

## 2022-01-09 DIAGNOSIS — R001 Bradycardia, unspecified: Secondary | ICD-10-CM | POA: Diagnosis not present

## 2022-01-20 ENCOUNTER — Other Ambulatory Visit: Payer: Self-pay

## 2022-01-20 ENCOUNTER — Encounter (HOSPITAL_COMMUNITY)
Admission: RE | Admit: 2022-01-20 | Discharge: 2022-01-20 | Disposition: A | Discharge status: Home / Self Care | Payer: Medicare HMO | Source: Ambulatory Visit | Attending: Physician Assistant | Admitting: Physician Assistant

## 2022-01-20 ENCOUNTER — Ambulatory Visit (HOSPITAL_COMMUNITY)
Admission: RE | Admit: 2022-01-20 | Discharge: 2022-01-20 | Disposition: A | Discharge status: Home / Self Care | Payer: Medicare HMO | Source: Ambulatory Visit | Attending: Physician Assistant | Admitting: Physician Assistant

## 2022-01-20 DIAGNOSIS — R0609 Other forms of dyspnea: Secondary | ICD-10-CM | POA: Diagnosis not present

## 2022-01-20 LAB — NM MYOCAR MULTI W/SPECT W/WALL MOTION / EF
LV dias vol: 133 mL (ref 62–150)
LV sys vol: 82 mL
Nuc Stress EF: 39 %
Peak HR: 83 {beats}/min
RATE: 0.4
Rest HR: 65 {beats}/min
Rest Nuclear Isotope Dose: 8.5 mCi
SDS: 1
SRS: 0
SSS: 1
ST Depression (mm): 0 mm
Stress Nuclear Isotope Dose: 28 mCi
TID: 1.12

## 2022-01-20 MED ORDER — TECHNETIUM TC 99M TETROFOSMIN IV KIT
10.0000 | PACK | Freq: Once | INTRAVENOUS | Status: AC | PRN
  Administered 2022-01-20: 8.5 via INTRAVENOUS

## 2022-01-20 MED ORDER — REGADENOSON 0.4 MG/5ML IV SOLN
INTRAVENOUS | Status: AC
  Administered 2022-01-20: 0.4 mg via INTRAVENOUS
  Filled 2022-01-20: qty 5

## 2022-01-20 MED ORDER — SODIUM CHLORIDE FLUSH 0.9 % IV SOLN
INTRAVENOUS | Status: AC
  Administered 2022-01-20: 10 mL via INTRAVENOUS
  Filled 2022-01-20: qty 10

## 2022-01-20 MED ORDER — TECHNETIUM TC 99M TETROFOSMIN IV KIT
30.0000 | PACK | Freq: Once | INTRAVENOUS | Status: AC | PRN
  Administered 2022-01-20: 28 via INTRAVENOUS

## 2022-01-21 ENCOUNTER — Telehealth: Payer: Self-pay | Admitting: *Deleted

## 2022-01-21 DIAGNOSIS — I5032 Chronic diastolic (congestive) heart failure: Secondary | ICD-10-CM

## 2022-01-21 NOTE — Telephone Encounter (Signed)
Pt notified and order placed 

## 2022-01-21 NOTE — Telephone Encounter (Signed)
-----   Message from Imogene Burn, [place] sent at 01/20/2022  3:11 PM EST ----- Stress test without ischemia but heart function appears low. Was normal back in 07/2021. Please order limited echo for LVEF. Thanks

## 2022-01-22 ENCOUNTER — Ambulatory Visit: Payer: Medicare HMO

## 2022-01-22 DIAGNOSIS — I5032 Chronic diastolic (congestive) heart failure: Secondary | ICD-10-CM

## 2022-01-22 LAB — ECHOCARDIOGRAM LIMITED
AR max vel: 1.91 cm2
AV Area VTI: 1.97 cm2
AV Area mean vel: 2.03 cm2
AV Mean grad: 15.1 mmHg
AV Peak grad: 30.2 mmHg
Ao pk vel: 2.75 m/s
Area-P 1/2: 2.62 cm2
S' Lateral: 4.5 cm

## 2022-01-30 DIAGNOSIS — E538 Deficiency of other specified B group vitamins: Secondary | ICD-10-CM | POA: Diagnosis not present

## 2022-01-30 DIAGNOSIS — E1165 Type 2 diabetes mellitus with hyperglycemia: Secondary | ICD-10-CM | POA: Diagnosis not present

## 2022-01-30 DIAGNOSIS — M2550 Pain in unspecified joint: Secondary | ICD-10-CM | POA: Diagnosis not present

## 2022-01-30 DIAGNOSIS — J0100 Acute maxillary sinusitis, unspecified: Secondary | ICD-10-CM | POA: Diagnosis not present

## 2022-01-31 DIAGNOSIS — R001 Bradycardia, unspecified: Secondary | ICD-10-CM | POA: Diagnosis not present

## 2022-02-07 ENCOUNTER — Telehealth: Payer: Self-pay | Admitting: *Deleted

## 2022-02-07 DIAGNOSIS — I471 Supraventricular tachycardia: Secondary | ICD-10-CM

## 2022-02-07 DIAGNOSIS — I491 Atrial premature depolarization: Secondary | ICD-10-CM

## 2022-02-07 DIAGNOSIS — I4891 Unspecified atrial fibrillation: Secondary | ICD-10-CM

## 2022-02-07 DIAGNOSIS — R001 Bradycardia, unspecified: Secondary | ICD-10-CM

## 2022-02-07 MED ORDER — APIXABAN 5 MG PO TABS: 5.0000 mg | ORAL_TABLET | Freq: Two times a day (BID) | ORAL | 11 refills | Status: DC

## 2022-02-07 NOTE — Telephone Encounter (Signed)
Pt notified and orders placed  

## 2022-02-07 NOTE — Telephone Encounter (Signed)
-----   Message from April Garrison, [place] sent at 02/06/2022  5:02 PM EST ----- ? ?----- Message ----- ?From: Imogene Burn, PA-C ?Sent: 02/06/2022   4:56 PM EST ?To: April Garrison, [place] ? ?Patient's monitor shows a lot of skipping and Afib and SVT. Afib puts him at risk of CVA so recommend starting Eliquis 5 mg bid. Please have him come in for BMET and CBC. Please refer to EPS for bradycardia/Afib/SVT/PAC's thanks ? ?

## 2022-02-11 ENCOUNTER — Ambulatory Visit: Payer: Medicare HMO

## 2022-02-11 ENCOUNTER — Other Ambulatory Visit: Payer: Self-pay

## 2022-02-11 ENCOUNTER — Ambulatory Visit (INDEPENDENT_AMBULATORY_CARE_PROVIDER_SITE_OTHER): Payer: Medicare HMO

## 2022-02-11 ENCOUNTER — Ambulatory Visit (INDEPENDENT_AMBULATORY_CARE_PROVIDER_SITE_OTHER): Payer: Medicare HMO | Admitting: Orthopaedic Surgery

## 2022-02-11 ENCOUNTER — Encounter: Payer: Self-pay | Admitting: Orthopaedic Surgery

## 2022-02-11 VITALS — BP 160/78 | HR 71 | Ht 71.0 in | Wt 229.8 lb

## 2022-02-11 DIAGNOSIS — M25562 Pain in left knee: Secondary | ICD-10-CM | POA: Diagnosis not present

## 2022-02-11 DIAGNOSIS — G8929 Other chronic pain: Secondary | ICD-10-CM | POA: Diagnosis not present

## 2022-02-11 DIAGNOSIS — E1142 Type 2 diabetes mellitus with diabetic polyneuropathy: Secondary | ICD-10-CM

## 2022-02-11 DIAGNOSIS — M25561 Pain in right knee: Secondary | ICD-10-CM | POA: Diagnosis not present

## 2022-02-11 NOTE — Progress Notes (Signed)
? ?Subjective:  ? ? Patient ID: Alejandro [name], male    DOB: Nov 04, 1941, 81 y.o.   MRN: [id number] ? ?HPI ?He has long history of bilateral knee pain and other medical conditions.  He has diabetes, is on a blood thinner for heart problems and has numbness of his feet.  Over the last month or so his knees have gotten worse.  He has had giving way of the right knee and swelling, popping.  He has no trauma, no redness.  He has tried Tylenol, ice and heat with little help.  He cannot take NSAIDs. ? ?He was seen At [facility] and I have the notes and I have reviewed them on the 02-04-22 visit. ? ? ?Review of Systems  ?Constitutional:  Positive for activity change.  ?Respiratory:  Positive for shortness of breath and wheezing.   ?Cardiovascular:  Positive for chest pain and palpitations.  ?Musculoskeletal:  Positive for arthralgias, gait problem and joint swelling.  ?Neurological:   ?     Numbness in the feet for a while.  No lesions or blisters.  ?All other systems reviewed and are negative. ?For Review of Systems, all other systems reviewed and are negative. ? ?The following is a summary of the past history medically, past history surgically, known current medicines, social history and family history.  This information is gathered electronically by the computer from prior information and documentation.  I review this each visit and have found including this information at this point in the chart is beneficial and informative.  ? ?Past Medical History:  ?Diagnosis Date  ? AAA Ultrasound 01/11/2020   ? AAA [place] 01/11/20:  no abdominal aortic aneurysm, 2.4 cm  ? Aortic insufficiency   ? a. mild-mod by intraop TEE 05/2018 (no aortic stenosis) // Echo 2/21: mild //  Echo 9/22: trivial  ? Aortic stenosis   ? Echo 9/22: mild (mean 16) // Echo 2/21: mild (mean 18.7)  ? CAD (coronary artery disease)   ? a. NSTEMI 05/2018 with multivessel disease -> s/p CABGx4 05/11/18 // Myoview 06/07/20: inf scar w mild peri-infarct ischemia, EF 37;  Int Risk  ? Carotid artery disease ([place])   ? PreCABG [place] 05/07/18: Bilat ICA 1-39  ? CKD (chronic kidney disease), stage III ([place])   ? HFimpEF (heart failure with improved EF)   ? Echocardiogram 01/11/20: EF 45, Gr 3 DD, mild AI, mild AS (mean 18.7 mmHg) // Echocardiogram 08/14/21:  EF 55-60, inf HK, mild LVH, Gr 1 DD, normal RVSF, RVSP 25.3, mild LAE, trivial MR, MAC, trivial AI, mild AS (mean 16 mmHg, Vmax 283 cm/s, DI 0.41)  ? History of nephrolithiasis 2004  ? Hyperlipidemia   ? Hypertension   ? IBS (irritable colon syndrome)   ? Ischemic cardiomyopathy   ? Mitral regurgitation   ? Echo 9/22: trivial MR  ? Postoperative atrial fibrillation (HCC)   ? PVC's (premature ventricular contractions)   ? Reflux   ? Type 2 diabetes mellitus ([place])   ? ? ?Past Surgical History:  ?Procedure Laterality Date  ? CORONARY ARTERY BYPASS GRAFT N/A 05/11/2018  ? Procedure: CORONARY ARTERY BYPASS GRAFTING (CABG), on pump, times four, using left internal mammary artery and endoscopically harvested right greater saphenous leg vein.;  Surgeon: Ivin Poot, MD;  Location: [place];  Service: Open Heart Surgery;  Laterality: N/A;  ? ESOPHAGOGASTRODUODENOSCOPY  02/04/2012  ? Procedure: ESOPHAGOGASTRODUODENOSCOPY (EGD);  Surgeon: Rogene Houston, MD;  [facility];  Service: Endoscopy;  Laterality: N/A;  100  ? LEFT HEART CATH AND CORONARY ANGIOGRAPHY N/A 05/04/2018  ? Procedure: LEFT HEART CATH AND CORONARY ANGIOGRAPHY;  Surgeon: Belva Crome, MD;  Location: [place] CV [facility];  Service: Cardiovascular;  Laterality: N/A;  ? NASAL FRACTURE SURGERY    ? TEE WITHOUT CARDIOVERSION N/A 05/11/2018  ? Procedure: TRANSESOPHAGEAL ECHOCARDIOGRAM (TEE);  Surgeon: Prescott Gum, Collier Salina, MD;  Location: [place];  Service: Open Heart Surgery;  Laterality: N/A;  ? ? ?Current Outpatient Medications on File Prior to Visit  ?Medication Sig Dispense Refill  ? apixaban (ELIQUIS) 5 MG TABS tablet Take 1 tablet (5 mg total) by mouth 2 (two) times daily. 60 tablet  11  ? atorvastatin (LIPITOR) 80 MG tablet TAKE 1 TABLET BY MOUTH EVERY DAY AT 6 PM 30 tablet 6  ? famotidine (PEPCID) 20 MG tablet One after supper 30 tablet 11  ? hydrochlorothiazide (HYDRODIURIL) 25 MG tablet Take 25 mg by mouth daily.    ? JARDIANCE 25 MG TABS tablet Take 25 mg by mouth daily.    ? Melatonin 10 MG TABS Take 3 each by mouth every evening. Gummies    ? metFORMIN (GLUCOPHAGE) 1000 MG tablet Take 1,000 mg by mouth daily with breakfast.    ? pantoprazole (PROTONIX) 40 MG tablet Take 1 tablet by mouth daily.    ? potassium chloride (MICRO-K) 10 MEQ CR capsule Take 10 mEq by mouth daily.    ? valsartan (DIOVAN) 40 MG tablet Take 1 tablet (40 mg total) by mouth daily. 30 tablet 11  ? vitamin B-12 (CYANOCOBALAMIN) 500 MCG tablet Take 1 tablet (500 mcg total) by mouth daily.    ? ?No current facility-administered medications on file prior to visit.  ? ? ?Social History  ? ?Socioeconomic History  ? Marital status: Widowed  ?  Spouse name: Not on file  ? Number of children: Not on file  ? Years of education: Not on file  ? Highest education level: Not on file  ?Occupational History  ? Not on file  ?Tobacco Use  ? Smoking status: Never  ? Smokeless tobacco: Never  ?Vaping Use  ? Vaping Use: Never used  ?Substance and Sexual Activity  ? Alcohol use: No  ?  Alcohol/week: 0.0 standard drinks  ? Drug use: No  ? Sexual activity: Yes  ?Other Topics Concern  ? Not on file  ?Social History Narrative  ? Not on file  ? ?Social Determinants of Health  ? ?Financial Resource Strain: Not on file  ?Food Insecurity: Not on file  ?Transportation Needs: Not on file  ?Physical Activity: Not on file  ?Stress: Not on file  ?Social Connections: Not on file  ?Intimate Partner Violence: Not on file  ? ? ?Family History  ?Problem Relation Age of Onset  ? Diabetes Mellitus II Mother   ? Heart attack Father   ? Heart attack Brother   ? Heart disease Other   ? Arthritis Other   ? Asthma Other   ? Diabetes Other   ? ? ?BP (!) 160/78    Pulse 71   Ht _0  (1.803 m)   Wt 229 lb 12.8 oz (104.2 kg)   BMI 32.05 kg/m?  ? ?Body mass index is 32.05 kg/m?. ? ?   ?Objective:  ? Physical Exam ?Vitals and nursing note reviewed. Exam conducted with a chaperone present.  ?Constitutional:   ?   Appearance: He is well-developed.  ?HENT:  ?   Head: Normocephalic and atraumatic.  ?  Eyes:  ?   Conjunctiva/sclera: Conjunctivae normal.  ?   Pupils: Pupils are equal, round, and reactive to light.  ?Cardiovascular:  ?   Rate and Rhythm: Normal rate and regular rhythm.  ?Pulmonary:  ?   Effort: Pulmonary effort is normal.  ?Abdominal:  ?   Palpations: Abdomen is soft.  ?Musculoskeletal:  ?   Cervical back: Normal range of motion and neck supple.  ?     Legs: ? ?Skin: ?   General: Skin is warm and dry.  ?Neurological:  ?   Mental Status: He is alert and oriented to person, place, and time.  ?   Cranial Nerves: No cranial nerve deficit.  ?   Motor: No abnormal muscle tone.  ?   Coordination: Coordination normal.  ?   Deep Tendon Reflexes: Reflexes are normal and symmetric. Reflexes normal.  ?Psychiatric:     ?   Behavior: Behavior normal.     ?   Thought Content: Thought content normal.     ?   Judgment: Judgment normal.  ?X-rays were done of both knees, reported separately. ? ? ? ? ?   ?Assessment & Plan:  ? ?Encounter Diagnoses  ?Name Primary?  ? Chronic pain of left knee Yes  ? Chronic pain of right knee   ? Diabetic peripheral neuropathy ([place])   ? ?PROCEDURE NOTE: ? ?The patient requests injections of the left knee , verbal consent was obtained. ? ?The left knee was prepped appropriately after time out was performed.  ? ?Sterile technique was observed and injection of 1 cc of DepoMedrol 40 mg with several cc's of plain xylocaine. Anesthesia was provided by ethyl chloride and a 20-gauge needle was used to inject the knee area. The injection was tolerated well.  A band aid dressing was applied. ? ?The patient was advised to apply ice later today and tomorrow to the  injection sight as needed. ? ?PROCEDURE NOTE: ? ?The patient requests injections of the right knee , verbal consent was obtained. ? ?The right knee was prepped appropriately after time out was performed.  ?

## 2022-02-13 ENCOUNTER — Other Ambulatory Visit (HOSPITAL_COMMUNITY): Payer: Medicare HMO

## 2022-02-13 NOTE — Progress Notes (Signed)
? ?Cardiology Office Note   ? ?Date:  02/15/2022  ? ?ID:  Alejandro [name], DOB [birth year], MRN [id number] ? ?PCP:  Pablo Lawrence, NP  ?Cardiologist: Rozann Lesches, MD   ? ?Chief Complaint  ?Patient presents with  ? Follow-up  ?  6 week visit  ? ? ?History of Present Illness:   ? ?Alejandro [name] is a 81 y.o. male with past medical history of CAD (s/p CABG in 05/2018), HTN, HLD, aortic stenosis, Lyme Disease, and Stage 3 CKD who presents to the office today for 6-week follow-up.  ? ?He was last examined by Estella Husk, PA-C in 01/2022 and had been evaluated in the ED the prior month for bradycardia with heart rate in the 30's and Lopressor was reduced to 25 mg twice daily and ultimately stopped by his PCP later that month. At the time of his follow-up visit, he reported still having shortness of breath but denied any chest pain or palpitations. Did report dizziness and balance issues as well. Orthostatics were negative. Given his recent dyspnea on exertion, a Lexiscan Myoview was recommended for further evaluation along with a Zio patch. His Lexiscan Myoview showed evidence of scar but no current ischemia. Was read as intermediate-risk given his reduced EF of 39%. A limited echocardiogram was recommended to clarify this and showed that his EF was preserved at 55 to 60% and he did have mild aortic valve stenosis but no significant valve abnormalities. Was noted to have mild dilation of the aortic root to 41 mm. His ZIO monitor showed predominantly normal sinus rhythm but he noted to have frequent PAC's representing 17% of total beats along with PVC's representing less than 1% of total beats. He did have brief PSVT with the longest being 13 beats but was also noted to have intermittent paroxysmal atrial fibrillation with a 1% burden. He was started on Eliquis 5 mg twice daily for anticoagulation and referred to EP for consideration of antiarrhythmic therapy given his prior bradycardia with low-dose beta-blocker  therapy. ? ?In talking with the patient today, he reports still having sinus congestion and has been on several rounds of antibiotics by his PCP with minimal improvement in his symptoms. He has not tried any decongestants and we reviewed plain Mucinex would be safe to use. Reports balance issues since this was diagnosed was overall feeling well prior. He denies any recent chest pain or dyspnea on exertion.  No recent palpitations and he was unaware of his irregular rhythm while his monitor was in place.  No reported orthopnea, PND or pitting edema. ? ? ?Past Medical History:  ?Diagnosis Date  ? AAA Ultrasound 01/11/2020   ? AAA [place] 01/11/20:  no abdominal aortic aneurysm, 2.4 cm  ? Aortic insufficiency   ? a. mild-mod by intraop TEE 05/2018 (no aortic stenosis) // Echo 2/21: mild //  Echo 9/22: trivial  ? Aortic stenosis   ? Echo 9/22: mild (mean 16) // Echo 2/21: mild (mean 18.7)  ? CAD (coronary artery disease)   ? a. NSTEMI 05/2018 with multivessel disease -> s/p CABGx4 05/11/18 // Myoview 06/07/20: inf scar w mild peri-infarct ischemia, EF 37; Int Risk  ? Carotid artery disease ([place])   ? PreCABG [place] 05/07/18: Bilat ICA 1-39  ? CKD (chronic kidney disease), stage III ([place])   ? HFimpEF (heart failure with improved EF)   ? Echocardiogram 01/11/20: EF 45, Gr 3 DD, mild AI, mild AS (mean 18.7 mmHg) // Echocardiogram 08/14/21:  EF 55-60, inf HK, mild  LVH, Gr 1 DD, normal RVSF, RVSP 25.3, mild LAE, trivial MR, MAC, trivial AI, mild AS (mean 16 mmHg, Vmax 283 cm/s, DI 0.41)  ? History of nephrolithiasis 2004  ? Hyperlipidemia   ? Hypertension   ? IBS (irritable colon syndrome)   ? Ischemic cardiomyopathy   ? Mitral regurgitation   ? Echo 9/22: trivial MR  ? Postoperative atrial fibrillation (HCC)   ? PVC's (premature ventricular contractions)   ? Reflux   ? Type 2 diabetes mellitus ([place])   ? ? ?Past Surgical History:  ?Procedure Laterality Date  ? CORONARY ARTERY BYPASS GRAFT N/A 05/11/2018  ? Procedure: CORONARY ARTERY BYPASS  GRAFTING (CABG), on pump, times four, using left internal mammary artery and endoscopically harvested right greater saphenous leg vein.;  Surgeon: Ivin Poot, MD;  Location: [place];  Service: Open Heart Surgery;  Laterality: N/A;  ? ESOPHAGOGASTRODUODENOSCOPY  02/04/2012  ? Procedure: ESOPHAGOGASTRODUODENOSCOPY (EGD);  Surgeon: Rogene Houston, MD;  [facility];  Service: Endoscopy;  Laterality: N/A;  100  ? LEFT HEART CATH AND CORONARY ANGIOGRAPHY N/A 05/04/2018  ? Procedure: LEFT HEART CATH AND CORONARY ANGIOGRAPHY;  Surgeon: Belva Crome, MD;  Location: [place] CV LAB;  Service: Cardiovascular;  Laterality: N/A;  ? NASAL FRACTURE SURGERY    ? TEE WITHOUT CARDIOVERSION N/A 05/11/2018  ? Procedure: TRANSESOPHAGEAL ECHOCARDIOGRAM (TEE);  Surgeon: Prescott Gum, Collier Salina, MD;  Location: [place];  Service: Open Heart Surgery;  Laterality: N/A;  ? ? ?Current Medications: ?Outpatient Medications Prior to Visit  ?Medication Sig Dispense Refill  ? apixaban (ELIQUIS) 5 MG TABS tablet Take 1 tablet (5 mg total) by mouth 2 (two) times daily. 60 tablet 11  ? atorvastatin (LIPITOR) 80 MG tablet TAKE 1 TABLET BY MOUTH EVERY DAY AT 6 PM 30 tablet 6  ? famotidine (PEPCID) 20 MG tablet One after supper 30 tablet 11  ? hydrochlorothiazide (HYDRODIURIL) 25 MG tablet Take 25 mg by mouth daily.    ? JARDIANCE 25 MG TABS tablet Take 25 mg by mouth daily.    ? Melatonin 10 MG TABS Take 3 each by mouth every evening. Gummies    ? metFORMIN (GLUCOPHAGE) 1000 MG tablet Take 1,000 mg by mouth daily with breakfast.    ? pantoprazole (PROTONIX) 40 MG tablet Take 1 tablet by mouth daily.    ? potassium chloride (MICRO-K) 10 MEQ CR capsule Take 10 mEq by mouth daily.    ? valsartan (DIOVAN) 40 MG tablet Take 1 tablet (40 mg total) by mouth daily. 30 tablet 11  ? vitamin B-12 (CYANOCOBALAMIN) 500 MCG tablet Take 1 tablet (500 mcg total) by mouth daily.    ? ?No facility-administered medications prior to visit.  ?  ? ?Allergies:    Patient has no known allergies.  ? ?Social History  ? ?Socioeconomic History  ? Marital status: Widowed  ?  Spouse name: Not on file  ? Number of children: Not on file  ? Years of education: Not on file  ? Highest education level: Not on file  ?Occupational History  ? Not on file  ?Tobacco Use  ? Smoking status: Never  ? Smokeless tobacco: Never  ?Vaping Use  ? Vaping Use: Never used  ?Substance and Sexual Activity  ? Alcohol use: No  ?  Alcohol/week: 0.0 standard drinks  ? Drug use: No  ? Sexual activity: Yes  ?Other Topics Concern  ? Not on file  ?Social History Narrative  ? Not on file  ? ?  Social Determinants of Health  ? ?Financial Resource Strain: Not on file  ?Food Insecurity: Not on file  ?Transportation Needs: Not on file  ?Physical Activity: Not on file  ?Stress: Not on file  ?Social Connections: Not on file  ?  ? ?Family History:  The patient's family history includes Arthritis in an other family member; Asthma in an other family member; Diabetes in an other family member; Diabetes Mellitus II in his mother; Heart attack in his brother and father; Heart disease in an other family member.  ? ?Review of Systems:   ? ?Please see the history of present illness.    ? ?All other systems reviewed and are otherwise negative except as noted above. ? ? ?Physical Exam:   ? ?VS:  BP 110/62   Pulse 72   Ht _0  (1.803 m)   Wt 228 lb (103.4 kg)   SpO2 95%   BMI 31.80 kg/m?    ?General: Well developed, well nourished,male appearing in no acute distress. ?Head: Normocephalic, atraumatic. ?Neck: No carotid bruits. JVD not elevated.  ?Lungs: Respirations regular and unlabored, without wheezes or rales.  ?Heart: Regular rate and rhythm. No S3 or S4.  2/6 SEM along RUSB.  ?Abdomen: Appears non-distended. No obvious abdominal masses. ?Msk:  Strength and tone appear normal for age. No obvious joint deformities or effusions. ?Extremities: No clubbing or cyanosis. No pitting edema.  Distal pedal pulses are 2+  bilaterally. ?Neuro: Alert and oriented X 3. Moves all extremities spontaneously. No focal deficits noted. ?Psych:  Responds to questions appropriately with a normal affect. ?Skin: No rashes or lesions noted ? ?Wt Readings from Last

## 2022-02-14 ENCOUNTER — Encounter: Payer: Self-pay | Admitting: Student

## 2022-02-14 ENCOUNTER — Other Ambulatory Visit: Payer: Self-pay

## 2022-02-14 ENCOUNTER — Ambulatory Visit (INDEPENDENT_AMBULATORY_CARE_PROVIDER_SITE_OTHER): Payer: Medicare HMO | Admitting: Student

## 2022-02-14 VITALS — BP 110/62 | HR 72 | Ht 71.0 in | Wt 228.0 lb

## 2022-02-14 DIAGNOSIS — I480 Paroxysmal atrial fibrillation: Secondary | ICD-10-CM | POA: Diagnosis not present

## 2022-02-14 DIAGNOSIS — I350 Nonrheumatic aortic (valve) stenosis: Secondary | ICD-10-CM | POA: Diagnosis not present

## 2022-02-14 DIAGNOSIS — I25810 Atherosclerosis of coronary artery bypass graft(s) without angina pectoris: Secondary | ICD-10-CM

## 2022-02-14 DIAGNOSIS — I10 Essential (primary) hypertension: Secondary | ICD-10-CM

## 2022-02-14 DIAGNOSIS — I4891 Unspecified atrial fibrillation: Secondary | ICD-10-CM

## 2022-02-14 NOTE — Patient Instructions (Signed)
Medication Instructions:  ?Your physician recommends that you continue on your current medications as directed. Please refer to the Current Medication list given to you today. ? ?*If you need a refill on your cardiac medications before your next appointment, please call your pharmacy* ? ? ?Lab Work: ?NONE  ? ?If you have labs (blood work) drawn today and your tests are completely normal, you will receive your results only by: ?MyChart Message (if you have MyChart) OR ?A paper copy in the mail ?If you have any lab test that is abnormal or we need to change your treatment, we will call you to review the results. ? ? ?Testing/Procedures: ?NONE  ? ? ?Follow-Up: ?At [facility], you and your health needs are our priority.  As part of our continuing mission to provide you with exceptional heart care, we have created designated Provider Care Teams.  These Care Teams include your primary Cardiologist (physician) and Advanced Practice Providers (APPs -  Physician Assistants and Nurse Practitioners) who all work together to provide you with the care you need, when you need it. ? ?We recommend signing up for the patient portal called "MyChart".  Sign up information is provided on this After Visit Summary.  MyChart is used to connect with patients for Virtual Visits (Telemedicine).  Patients are able to view lab/test results, encounter notes, upcoming appointments, etc.  Non-urgent messages can be sent to your provider as well.   ?To learn more about what you can do with MyChart, go to [email or web address].   ? ?Your next appointment:   ? April  ? ?The format for your next appointment:   ?In Person ? ?Provider:   ?Cristopher Peru, MD  ? ? ?Other Instructions ?Thank you for choosing [place]! ?  ? ?

## 2022-02-15 ENCOUNTER — Encounter: Payer: Self-pay | Admitting: Student

## 2022-02-24 DIAGNOSIS — R634 Abnormal weight loss: Secondary | ICD-10-CM | POA: Diagnosis not present

## 2022-02-24 DIAGNOSIS — J0100 Acute maxillary sinusitis, unspecified: Secondary | ICD-10-CM | POA: Diagnosis not present

## 2022-03-11 DIAGNOSIS — J984 Other disorders of lung: Secondary | ICD-10-CM | POA: Diagnosis not present

## 2022-03-11 DIAGNOSIS — Z951 Presence of aortocoronary bypass graft: Secondary | ICD-10-CM | POA: Diagnosis not present

## 2022-03-11 DIAGNOSIS — R0602 Shortness of breath: Secondary | ICD-10-CM | POA: Diagnosis not present

## 2022-03-11 DIAGNOSIS — I25810 Atherosclerosis of coronary artery bypass graft(s) without angina pectoris: Secondary | ICD-10-CM | POA: Diagnosis not present

## 2022-03-11 DIAGNOSIS — I480 Paroxysmal atrial fibrillation: Secondary | ICD-10-CM | POA: Diagnosis not present

## 2022-03-11 DIAGNOSIS — I255 Ischemic cardiomyopathy: Secondary | ICD-10-CM | POA: Diagnosis not present

## 2022-03-11 DIAGNOSIS — I350 Nonrheumatic aortic (valve) stenosis: Secondary | ICD-10-CM | POA: Diagnosis not present

## 2022-03-20 ENCOUNTER — Ambulatory Visit: Payer: Medicare HMO | Admitting: Internal Medicine

## 2022-03-25 DIAGNOSIS — E1122 Type 2 diabetes mellitus with diabetic chronic kidney disease: Secondary | ICD-10-CM | POA: Diagnosis not present

## 2022-03-25 DIAGNOSIS — N1831 Chronic kidney disease, stage 3a: Secondary | ICD-10-CM | POA: Diagnosis not present

## 2022-03-25 DIAGNOSIS — I5032 Chronic diastolic (congestive) heart failure: Secondary | ICD-10-CM | POA: Diagnosis not present

## 2022-03-25 DIAGNOSIS — I10 Essential (primary) hypertension: Secondary | ICD-10-CM | POA: Diagnosis not present

## 2022-03-25 DIAGNOSIS — I25119 Atherosclerotic heart disease of native coronary artery with unspecified angina pectoris: Secondary | ICD-10-CM | POA: Diagnosis not present

## 2022-03-25 DIAGNOSIS — J189 Pneumonia, unspecified organism: Secondary | ICD-10-CM | POA: Diagnosis not present

## 2022-03-25 DIAGNOSIS — I129 Hypertensive chronic kidney disease with stage 1 through stage 4 chronic kidney disease, or unspecified chronic kidney disease: Secondary | ICD-10-CM | POA: Diagnosis not present

## 2022-03-25 DIAGNOSIS — K219 Gastro-esophageal reflux disease without esophagitis: Secondary | ICD-10-CM | POA: Diagnosis not present

## 2022-04-23 DIAGNOSIS — H524 Presbyopia: Secondary | ICD-10-CM | POA: Diagnosis not present

## 2022-04-23 DIAGNOSIS — H11002 Unspecified pterygium of left eye: Secondary | ICD-10-CM | POA: Diagnosis not present

## 2022-04-23 DIAGNOSIS — H25813 Combined forms of age-related cataract, bilateral: Secondary | ICD-10-CM | POA: Diagnosis not present

## 2022-04-23 DIAGNOSIS — E113291 Type 2 diabetes mellitus with mild nonproliferative diabetic retinopathy without macular edema, right eye: Secondary | ICD-10-CM | POA: Diagnosis not present

## 2022-04-25 DIAGNOSIS — G4719 Other hypersomnia: Secondary | ICD-10-CM | POA: Diagnosis not present

## 2022-04-25 DIAGNOSIS — R5383 Other fatigue: Secondary | ICD-10-CM | POA: Diagnosis not present

## 2022-04-25 DIAGNOSIS — Z0000 Encounter for general adult medical examination without abnormal findings: Secondary | ICD-10-CM | POA: Diagnosis not present

## 2022-04-25 DIAGNOSIS — I10 Essential (primary) hypertension: Secondary | ICD-10-CM | POA: Diagnosis not present

## 2022-04-25 DIAGNOSIS — J3489 Other specified disorders of nose and nasal sinuses: Secondary | ICD-10-CM | POA: Diagnosis not present

## 2022-04-29 ENCOUNTER — Encounter: Payer: Self-pay | Admitting: Internal Medicine

## 2022-04-29 ENCOUNTER — Ambulatory Visit: Payer: Medicare HMO | Admitting: Internal Medicine

## 2022-04-29 DIAGNOSIS — R058 Other specified cough: Secondary | ICD-10-CM

## 2022-04-29 DIAGNOSIS — R0609 Other forms of dyspnea: Secondary | ICD-10-CM | POA: Diagnosis not present

## 2022-04-29 NOTE — Assessment & Plan Note (Signed)
Off acei 09/10/21 >  75% improved 10/28/2021 but still am cough - max gerd rx/ bed blocks rec 10/28/2021  - Sinus CT  04/29/2022  - ENT eval 04/29/2022 >>>   Upper airway cough syndrome (previously labeled PNDS),  is so named because it's frequently impossible to sort out how much is  CR/sinusitis with freq throat clearing (which can be related to primary GERD)   vs  causing  secondary (" extra esophageal")  GERD from wide swings in gastric pressure that occur with throat clearing, often  promoting self use of mint and menthol lozenges that reduce the lower esophageal sphincter tone and exacerbate the problem further in a cyclical fashion.   These are the same pts (now being labeled as having "irritable larynx syndrome" by some cough centers) who not infrequently have a history of having failed to tolerate ace inhibitors,  dry powder inhalers or biphosphonates or report having atypical/extraesophageal reflux symptoms that don't respond to standard doses of PPI  and are easily confused as having aecopd or asthma flares by even experienced allergists/ pulmonologists (myself included).   rec  Sinus CT as abx did not help/ repeat max gerd rx pending refer to ENT   Advised pt: The standardized cough guidelines published in Chest by Lissa Morales in 2006 are still the best available and consist of a multiple step process (up to 12!) , not a single office visit,  and are intended  to address this problem logically,  with an alogrithm dependent on response to empiric treatment at  each progressive step  to determine a specific diagnosis with  minimal addtional testing needed. Therefore if adherence is an issue or can't be accurately verified,  it's very unlikely the standard evaluation and treatment will be successful here.    Furthermore, response to therapy (other than acute cough suppression, which should only be used short term with avoidance of narcotic containing cough syrups if possible), can be a  gradual process for which the patient is not likely to  perceive immediate benefit.  Unlike going to an eye doctor where the best perscription is almost always the first one and is immediately effective, this is almost never the case in the management of chronic cough syndromes. Therefore the patient needs to commit up front to consistently adhere to recommendations  for up to 6 weeks of therapy directed at the likely underlying problem(s) before the response can be reasonably evaluated.

## 2022-04-29 NOTE — Patient Instructions (Addendum)
Pantoprazole (protonix) 40 mg   Take  30-60 min before first meal of the day and Pepcid (famotidine)  20 mg after supper until return to office - this is the best way to tell whether stomach acid is contributing to your problem.     My office will be contacting you by phone for referral to ENT in [place] get a chest and sinus CT here  while you are waiting    Please schedule a follow up office visit in 6 weeks, call sooner if needed with all medications /inhalers/ solutions in hand so we can verify exactly what you are taking. This includes all medications from all doctors and over the counters

## 2022-04-29 NOTE — Assessment & Plan Note (Signed)
Onset spring 2012 - Echo 08/14/21  mild AS / Mild LAE/ nl R side  - CT chest  08/23/21 ? Mild early PF - trial off acei 09/10/2021 >>> cough 75% better  - 09/10/2021   Walked on RA x  3  lap(s) =  approx 450 @ slow pace,   with lowest 02 sats 97%  At end but doe on the 2nd lap  - 10/28/2021   Walked on RA x  3  lap(s) =  approx 472fg @ moderate  pace, stopped due to end of study with mild sob on last lap and no ataxia noted  with lowest 02 sats 98%   - 04/29/2022   Walked on  RA  x  3  lap(s) =  approx450  ft  @ moderate pace, stopped due to end of study  with lowest 02 sats 90%    He has lost ground with 02 sats and has not yet had HRCT so this is next step   Discussed in detail all the  indications, usual  risks and alternatives  relative to the benefits with patient who agrees to proceed with w/u as outlined.     Each maintenance medication was reviewed in detail including emphasizing most importantly the difference between maintenance and prns and under what circumstances the prns are to be triggered using an action plan format where appropriate.  Total time for H and P, chart review, counseling,  directly observing portions of ambulatory 02 saturation study/ and generating customized AVS unique to this office visit / same day charting  > 30 min

## 2022-04-29 NOTE — Progress Notes (Signed)
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  The patient works in [employer] and works outside on a daily basis.  Apparently, the patient was noted to have some gait instability and altered mental status on 07/11/2021.  His grandson took him home, and the patient went to take a nap.  Apparently, the patient continued to have some dizziness and gait instability.  He had an episode of diaphoresis when he woke up.  As result, the patient was brought to the hospital for further evaluation.  There was concern initially that the patient had not been hydrating well.  The patient did have 2 episodes of nausea and vomiting on the day of admission.  In the emergency department, the patient had SIRS criteria with a temperature of 94.8 F, heart rate in the 40s, WBC 14.2, and lactate 2.0.  Patient was admitted for further evaluation and treatment.  He was started on intravenous antibiotics and IV fluids.  Since then, his mental status has gradually improved.  His vital signs have stabilized.   Discharge Diagnoses:    SIRS -Present on admission -Sepsis ruled out -Presented with hypothermia and leukocytosis with lactate 2.0 -Physiologically improved -Blood cultures remain negative -UA negative for pyuria -Chest x-ray without any infiltrates -Discontinue antibiotics and observe clinically -Lactic acid 2.0>>1.7 -PCT <0.10 x 3   Acute  metabolic encephalopathy -Mental status has improved with hydration and empiric antibiotics -Oral intake has improved -Discontinue IV fluids and IV antibiotics and observe clinically -Serum B12--61>>>given IM inj and start po supplement -EVO--3.500   Chronic systolic and diastolic CHF -Clinically euvolemic -01/11/2020 echo EF 45%, grade 3 DD, moderate MR, mild AS -Continue aspirin, metoprolol, Lipitor   Coronary artery disease status post CABG -No chest pain presently -Continue aspirin and Lipitor -Continue metoprolol tartrate   Lactic acidosis -Sepsis ruled out   Abnormal EKG -Patient presented with junctional rhythm and RBBB -Improved after electrolyte abnormalities and metabolic abnormalities corrected -07/12/2021 EKG--personally reviewed sinus rhythm, nonspecific T wave changes -remained on telemetry without concerning dysrhythmia   Essential hypertension -Continue metoprolol tartrate -Restart hydralazine -Continue lisinopril>>increase to 10 mg daily -Holding HCTZ>>resume after d/c   Uncontrolled diabetes mellitus type 2 with hyperglycemia -07/12/2021 hemoglobin A1c 8.3 -Holding metformin and Jardiance >>resume after d/c -Continue NovoLog sliding scale   Anxiety -His benzodiazepines were held in the setting of his altered mental status at the time of admission   History of Present Illness  09/10/2021  Pulmonary/ 1st [name] eval/Alejandro [name]  Chief Complaint  Patient presents with   Consult    Abnormal chest ct. Fatigue, no SOB. Persistent dry cough.   Dyspnea:  progressively worse x 6 m but limited by dizzy /sob about the same time  Cough: dry  raspy cough  x about the same time Sleep: fine 2 pillows  SABA use: none   Rec Stop lisinopril  and start valsartan 80 mg one daily in it's place and  the voice should improve and the cough should go away over several weeks  Recommend paced walking and keep track of your 02 saturation at peak exertion (not after stopping)  -once a  week is enough to see if trending down (above 90% is the goal)  We will walk you slowly around the [name] today for a baseline to compare to next visit - Be sure to bring your cane    10/28/2021  f/u ov/Alejandro [name]/Alejandro [name] re: doe? PF / acei case maint on ppi ac qd  still some am cough/ congestion and still unsteady on his feet  Chief Complaint  Patient presents with   Follow-up    Feels some congestion and headaches. SOB and cough have improved since last visit.   Dyspnea:  able to walk to shed ok now limited by balance  Cough: gone off acei / 25%  still congested in am > white  Sleeping: flat bed/ 2 pillows no problem SABA use: 2-3 per week only p exertion, never pre-treats  02: none  Covid status: vax x 3  Rec Add Pepcid   20 mg after supper or bedtime until return  Continue pantoprazole 40 mg Take 30-60 min before first meal of the day  Only use your albuterol as a rescue medication Ok to try albuterol 15 min before an activity (on alternating days)  that you know would usually make you short of breath GERD diet reviewed, bed blocks rec   We will schedule you a head ct due to balance issues, headache and nasal congestion> did not do    Make sure you check your oxygen saturation at your highest level of activity to be sure it stays over 90%   04/29/2022  f/u ov/Alejandro [name]/Alejandro [name] re: uacs ? maint on gerd rx / very confused with instructions/ did not bring cane   Chief Complaint  Patient presents with   Follow-up    Believes he has sinus infection.   Dyspnea:  walking to shed  Cough: resolved Nasal obstruction/ seems better as day goes on but face / head feel like busting out x one month Sleeping: bed is flat / on side, 2 pillows  SABA use: ? Helps / only uses p exertion 02: none  Covid status: vax x 3     No obvious day to day or daytime variability or assoc excess/ purulent sputum or mucus plugs or hemoptysis or cp or chest tightness, subjective wheeze or overt  hb  symptoms.   Sleeping  without nocturnal  or early am exacerbation  of respiratory  c/o's or need for noct saba. Also denies any obvious fluctuation of symptoms with weather or environmental changes or other aggravating or alleviating factors except as outlined above   No unusual exposure hx or h/o childhood pna/ asthma or knowledge of premature birth.  Current Allergies, Complete Past Medical History, Past Surgical History, Family History, and Social History were reviewed in [facility] record.  ROS  The following are not active complaints unless bolded Hoarseness, sore throat, dysphagia, dental problems, itching, sneezing,  nasal congestion or discharge of excess mucus or purulent secretions, ear ache,   fever, chills, sweats, unintended wt loss or wt gain, classically pleuritic or exertional cp,  orthopnea pnd or arm/hand swelling  or leg swelling, presyncope, palpitations, abdominal pain, anorexia, nausea, vomiting, diarrhea  or change in bowel habits or change in bladder habits, change in stools or change in urine, dysuria, hematuria,  rash, arthralgias, visual complaints, headache,  numbness, weakness or ataxia or problems with walking or coordination,  change in mood or  memory.        Current Meds - - NOTE:   Unable to verify as accurately reflecting what pt takes    Medication Sig   apixaban (ELIQUIS) 5 MG TABS tablet Take 1 tablet (5 mg total) by mouth 2 (two) times daily.   atorvastatin (LIPITOR) 80 MG tablet TAKE 1 TABLET BY MOUTH EVERY DAY AT 6 PM   famotidine (PEPCID) 20 MG tablet One after supper   hydrochlorothiazide (HYDRODIURIL) 25 MG tablet Take 25 mg by mouth daily.   JARDIANCE 25 MG TABS tablet Take 25 mg by mouth daily.   Melatonin 10 MG TABS Take 3 each by mouth every evening. Gummies   metFORMIN (GLUCOPHAGE) 1000 MG tablet Take 1,000 mg by mouth daily with breakfast.   pantoprazole (PROTONIX) 40 MG tablet Take 1 tablet by mouth daily.   potassium  chloride (MICRO-K) 10 MEQ CR capsule Take 10 mEq by mouth daily.   valsartan (DIOVAN) 40 MG tablet Take 1 tablet (40 mg total) by mouth daily.   vitamin B-12 (CYANOCOBALAMIN) 500 MCG tablet Take 1 tablet (500 mcg total) by mouth daily.             Past Medical History:  Diagnosis Date   Aortic insufficiency    a. mild-mod by intraop TEE 05/2018 (no aortic stenosis).   CAD (coronary artery disease)    a. NSTEMI 05/2018 with multivessel disease -> s/p CABGx4 05/11/18.   Chronic diastolic CHF (congestive heart failure) ([place])    a. dx 05/2018.   CKD (chronic kidney disease), stage III ([place])    History of nephrolithiasis 2004   Hyperlipidemia    Hypertension    IBS (irritable colon syndrome)    Postoperative atrial fibrillation (HCC)    PVC's (premature ventricular contractions)    Reflux    Type 2 diabetes mellitus (HCC)        Objective:    04/29/2022       231   10/28/21 230 lb 1.9 oz (104.4 kg)  09/10/21 230 lb 1.9 oz (104.4 kg)  07/22/21 231 lb (104.8 kg)    Vital signs reviewed  04/29/2022  - Note at rest 02 sats  96% on RA   General appearance:    amb elderly wm very confused with details of care  / prominent pseudowheeze / occ throat clearing      HEENT : Oropharynx  clear  Nasal turbintes min mp secretions bilaterally (with patent passages bilaterally though he says he can't breathe thru nose at time of exam)    NECK :  without  appent JVD/ palpable Nodes/TM    LUNGS: no acc muscle use,  Nl contour chest  insp crackles in bases  bilaterally  L > R base  s cough on inspiration    CV:  RRR  no s3 or 2-3/6 SEM  or increase in P2, and no edema   ABD:  soft and nontender with nl inspiratory excursion in the supine position. No bruits or organomegaly appreciated   MS:  walking good pace s cane/ ext warm without deformities Or obvious joint restrictions  calf tenderness, cyanosis or clubbing    SKIN: warm and dry without lesions    NEURO:  alert, approp, nl sensorium  with  no motor or cerebellar deficits apparent.       Assessment

## 2022-05-23 ENCOUNTER — Ambulatory Visit: Payer: Medicare HMO | Admitting: Nurse Practitioner

## 2022-06-02 ENCOUNTER — Ambulatory Visit (HOSPITAL_COMMUNITY): Admission: RE | Admit: 2022-06-02 | Payer: Medicare HMO | Source: Ambulatory Visit

## 2022-07-03 ENCOUNTER — Encounter: Payer: Self-pay | Admitting: Internal Medicine

## 2022-07-03 ENCOUNTER — Ambulatory Visit: Payer: Medicare HMO | Admitting: Internal Medicine

## 2022-07-03 VITALS — BP 126/64 | HR 90 | Ht 71.0 in | Wt 233.6 lb

## 2022-07-03 DIAGNOSIS — I4819 Other persistent atrial fibrillation: Secondary | ICD-10-CM

## 2022-07-03 NOTE — Patient Instructions (Signed)
Medication Instructions:  Your physician recommends that you continue on your current medications as directed. Please refer to the Current Medication list given to you today.  *If you need a refill on your cardiac medications before your next appointment, please call your pharmacy*   Lab Work: NONE   If you have labs (blood work) drawn today and your tests are completely normal, you will receive your results only by: Alej[name] (if you have MyChart) OR A paper copy in the mail If you have any lab test that is abnormal or we need to change your treatment, we will call you to review the results.   Testing/Procedures: NONE    Follow-Up: At [facility], you and your health needs are our priority.  As part of our continuing mission to provide you with exceptional heart care, we have created designated Provider Care Teams.  These Care Teams include your primary Cardiologist (physician) and Advanced Practice Providers (APPs -  Physician Assistants and Nurse Practitioners) who all work together to provide you with the care you need, when you need it.  We recommend signing up for the patient portal called "MyChart".  Sign up information is provided on this After Visit Summary.  MyChart is used to connect with patients for Virtual Visits (Telemedicine).  Patients are able to view lab/test results, encounter notes, upcoming appointments, etc.  Non-urgent messages can be sent to your provider as well.   To learn more about what you can do with MyChart, go to [email or web address].    Your next appointment:    As Needed   The format for your next appointment:   In Person  Provider:   Cristopher Peru, MD    Other Instructions Thank you for choosing [facility]!    Important Information About Sugar

## 2022-07-03 NOTE — Progress Notes (Signed)
HPI Alejandro [name] is referred today by Nena Polio PA, for evaluation of atrial fib and bradycardia. He has sinus node dysfunction and a h/o bradycardia. He has atrial fib and is asymptomatic. He has followed with Dr. [name] in the past. He has not had syncope. His main complaint is related to his sinus congestion. No palpitations or chest pain or sob.  No Known Allergies   Current Outpatient Medications  Medication Sig Dispense Refill   apixaban (ELIQUIS) 5 MG TABS tablet Take 1 tablet (5 mg total) by mouth 2 (two) times daily. 60 tablet 11   atorvastatin (LIPITOR) 80 MG tablet TAKE 1 TABLET BY MOUTH EVERY DAY AT 6 PM 30 tablet 6   famotidine (PEPCID) 20 MG tablet One after supper 30 tablet 11   hydrochlorothiazide (HYDRODIURIL) 25 MG tablet Take 25 mg by mouth daily.     JARDIANCE 25 MG TABS tablet Take 25 mg by mouth daily.     Melatonin 10 MG TABS Take 3 each by mouth every evening. Gummies     metFORMIN (GLUCOPHAGE) 1000 MG tablet Take 1,000 mg by mouth daily with breakfast.     pantoprazole (PROTONIX) 40 MG tablet Take 1 tablet by mouth daily.     potassium chloride (MICRO-K) 10 MEQ CR capsule Take 10 mEq by mouth daily.     valsartan (DIOVAN) 40 MG tablet Take 1 tablet (40 mg total) by mouth daily. 30 tablet 11   vitamin B-12 (CYANOCOBALAMIN) 500 MCG tablet Take 1 tablet (500 mcg total) by mouth daily.     No current facility-administered medications for this visit.     Past Medical History:  Diagnosis Date   AAA Ultrasound 01/11/2020    AAA [place] 01/11/20:  no abdominal aortic aneurysm, 2.4 cm   Aortic insufficiency    a. mild-mod by intraop TEE 05/2018 (no aortic stenosis) // Echo 2/21: mild //  Echo 9/22: trivial   Aortic stenosis    Echo 9/22: mild (mean 16) // Echo 2/21: mild (mean 18.7)   CAD (coronary artery disease)    a. NSTEMI 05/2018 with multivessel disease -> s/p CABGx4 05/11/18 // Myoview 06/07/20: inf scar w mild peri-infarct ischemia, EF 37; Int Risk   Carotid  artery disease ([place])    PreCABG [place] 05/07/18: Bilat ICA 1-39   CKD (chronic kidney disease), stage III ([place])    HFimpEF (heart failure with improved EF)    Echocardiogram 01/11/20: EF 45, Gr 3 DD, mild AI, mild AS (mean 18.7 mmHg) // Echocardiogram 08/14/21:  EF 55-60, inf HK, mild LVH, Gr 1 DD, normal RVSF, RVSP 25.3, mild LAE, trivial MR, MAC, trivial AI, mild AS (mean 16 mmHg, Vmax 283 cm/s, DI 0.41)   History of nephrolithiasis 2004   Hyperlipidemia    Hypertension    IBS (irritable colon syndrome)    Ischemic cardiomyopathy    Mitral regurgitation    Echo 9/22: trivial MR   Postoperative atrial fibrillation (HCC)    PVC's (premature ventricular contractions)    Reflux    Type 2 diabetes mellitus ([place])     ROS:   All systems reviewed and negative except as noted in the HPI.   Past Surgical History:  Procedure Laterality Date   CORONARY ARTERY BYPASS GRAFT N/A 05/11/2018   Procedure: CORONARY ARTERY BYPASS GRAFTING (CABG), on pump, times four, using left internal mammary artery and endoscopically harvested right greater saphenous leg vein.;  Surgeon: Ivin Poot, MD;  Location: [facility];  Service: Open Heart Surgery;  Laterality: N/A;   ESOPHAGOGASTRODUODENOSCOPY  02/04/2012   Procedure: ESOPHAGOGASTRODUODENOSCOPY (EGD);  Surgeon: Rogene Houston, MD;  [facility];  Service: Endoscopy;  Laterality: N/A;  100   LEFT HEART CATH AND CORONARY ANGIOGRAPHY N/A 05/04/2018   Procedure: LEFT HEART CATH AND CORONARY ANGIOGRAPHY;  Surgeon: Belva Crome, MD;  Location: [facility];  Service: Cardiovascular;  Laterality: N/A;   NASAL FRACTURE SURGERY     TEE WITHOUT CARDIOVERSION N/A 05/11/2018   Procedure: TRANSESOPHAGEAL ECHOCARDIOGRAM (TEE);  Surgeon: Prescott Gum, Collier Salina, MD;  Location: [facility];  Service: Open Heart Surgery;  Laterality: N/A;     Family History  Problem Relation Age of Onset   Diabetes Mellitus II Mother    Heart attack Father    Heart attack Brother     Heart disease Other    Arthritis Other    Asthma Other    Diabetes Other      Social History   Socioeconomic History   Marital status: Widowed    Spouse name: Not on file   Number of children: Not on file   Years of education: Not on file   Highest education level: Not on file  Occupational History   Not on file  Tobacco Use   Smoking status: Never   Smokeless tobacco: Never  Vaping Use   Vaping Use: Never used  Substance and Sexual Activity   Alcohol use: No    Alcohol/week: 0.0 standard drinks of alcohol   Drug use: No   Sexual activity: Yes  Other Topics Concern   Not on file  Social History Narrative   Not on file   Social Determinants of Health   Financial Resource Strain: Not on file  Food Insecurity: Not on file  Transportation Needs: Not on file  Physical Activity: Not on file  Stress: Not on file  Social Connections: Not on file  Intimate Partner Violence: Not on file     BP 126/64   Pulse 90   Ht _0  (1.803 m)   Wt 233 lb 9.6 oz (106 kg)   SpO2 100%   BMI 32.58 kg/m   Physical Exam:  Well appearing NAD HEENT: Unremarkable Neck:  No JVD, no thyromegally Lymphatics:  No adenopathy Back:  No CVA tenderness Lungs:  Clear with no wheezes HEART:  Regular rate rhythm, no murmurs, no rubs, no clicks Abd:  soft, positive bowel sounds, no organomegally, no rebound, no guarding Ext:  2 plus pulses, no edema, no cyanosis, no clubbing Skin:  No rashes no nodules Neuro:  CN II through XII intact, motor grossly intact  EKG - none  Assess/Plan:  Atrial fib - off of all AV nodal blocking drugs his VR appears to be well controlled. I do not think he needs a PPM. I discussed the symptoms he needs to watch out for.  Sinus node dysfunction - review of his old ECG's shows marked sinus node dysfunction but it appears that he is now mostly in atrial fib. He will call us if his hear rate slows.  CAD - he denies anginal symptoms. We will follow. Diastolic  heart failure - he is class 2. He will continue his current meds.  Carleene Overlie Imanol Bihl,MD

## 2022-07-14 ENCOUNTER — Encounter: Payer: Self-pay | Admitting: Cardiology

## 2022-07-17 ENCOUNTER — Telehealth: Payer: Self-pay

## 2022-07-17 NOTE — Telephone Encounter (Signed)
     His monitor that was ordered earlier this year by Selinda Eon only showed that he was in atrial fibrillation 1% of the time. He has what we call paroxysmal atrial fibrillation meaning he goes in and out of the arrhythmia. Cardioversions are only for patients who are persistently in the rhythm and do not convert back to normal sinus rhythm on their own. A cardioversion would not be indicated at this time since he spontaneously goes into normal rhythm.   Signed, Erma Heritage, PA-C 07/17/2022, 11:27 AM

## 2022-07-17 NOTE — Telephone Encounter (Signed)
The following is from a MyChart message from patient:  I was told you were going to schedule an appointment For me to [facility] to shock my heart for better rhythm. Please call so we can take care of this. Thanks,  Alejandro [name]    Patient saw Dr.Taylor 07/03/22 and was told he does not need a pacemaker and his VR was well controlled was made PRN with Dr.Taylor. I am not sure what why patient believes he needs a cardioversion.   Please advise.

## 2022-07-17 NOTE — Telephone Encounter (Signed)
Sent to pt via MyChart

## 2022-09-03 ENCOUNTER — Other Ambulatory Visit (HOSPITAL_COMMUNITY): Payer: Self-pay | Admitting: Adult Health Nurse Practitioner

## 2022-09-03 ENCOUNTER — Ambulatory Visit (HOSPITAL_COMMUNITY)
Admission: RE | Admit: 2022-09-03 | Discharge: 2022-09-03 | Disposition: A | Discharge status: Home / Self Care | Payer: Medicare HMO | Source: Ambulatory Visit | Attending: Adult Health Nurse Practitioner | Admitting: Adult Health Nurse Practitioner

## 2022-09-03 ENCOUNTER — Other Ambulatory Visit: Payer: Self-pay | Admitting: Adult Health Nurse Practitioner

## 2022-09-03 DIAGNOSIS — J8410 Pulmonary fibrosis, unspecified: Secondary | ICD-10-CM

## 2022-09-04 ENCOUNTER — Other Ambulatory Visit (HOSPITAL_COMMUNITY): Payer: Self-pay | Admitting: Adult Health Nurse Practitioner

## 2022-09-04 DIAGNOSIS — J8410 Pulmonary fibrosis, unspecified: Secondary | ICD-10-CM

## 2022-09-04 DIAGNOSIS — R0609 Other forms of dyspnea: Secondary | ICD-10-CM

## 2022-09-05 ENCOUNTER — Other Ambulatory Visit (HOSPITAL_COMMUNITY): Payer: Self-pay | Admitting: Family Medicine

## 2022-09-05 DIAGNOSIS — R918 Other nonspecific abnormal finding of lung field: Secondary | ICD-10-CM

## 2022-09-11 ENCOUNTER — Encounter (HOSPITAL_COMMUNITY): Payer: Self-pay

## 2022-09-11 ENCOUNTER — Encounter (HOSPITAL_COMMUNITY)
Admission: RE | Admit: 2022-09-11 | Discharge: 2022-09-11 | Disposition: A | Discharge status: Home / Self Care | Payer: Medicare HMO | Source: Ambulatory Visit | Attending: Family Medicine | Admitting: Family Medicine

## 2022-09-11 ENCOUNTER — Ambulatory Visit (HOSPITAL_COMMUNITY)
Admission: RE | Admit: 2022-09-11 | Discharge: 2022-09-11 | Disposition: A | Discharge status: Home / Self Care | Payer: Medicare HMO | Source: Ambulatory Visit | Attending: Adult Health Nurse Practitioner | Admitting: Adult Health Nurse Practitioner

## 2022-09-11 DIAGNOSIS — R0609 Other forms of dyspnea: Secondary | ICD-10-CM | POA: Insufficient documentation

## 2022-09-11 DIAGNOSIS — R918 Other nonspecific abnormal finding of lung field: Secondary | ICD-10-CM | POA: Insufficient documentation

## 2022-09-11 DIAGNOSIS — J8410 Pulmonary fibrosis, unspecified: Secondary | ICD-10-CM | POA: Diagnosis present

## 2022-09-11 MED ORDER — FLUDEOXYGLUCOSE F - 18 (FDG) INJECTION
11.7900 | Freq: Once | INTRAVENOUS | Status: AC | PRN
  Administered 2022-09-11: 11.79 via INTRAVENOUS

## 2022-09-16 ENCOUNTER — Inpatient Hospital Stay: Payer: Medicare HMO | Attending: Hematology | Admitting: Hematology

## 2022-09-16 ENCOUNTER — Inpatient Hospital Stay: Payer: Medicare HMO

## 2022-09-16 ENCOUNTER — Encounter: Payer: Self-pay | Admitting: Hematology

## 2022-09-16 VITALS — BP 149/95 | HR 85 | Temp 97.1°F | Resp 18 | Ht 71.0 in | Wt 232.0 lb

## 2022-09-16 DIAGNOSIS — N183 Chronic kidney disease, stage 3 unspecified: Secondary | ICD-10-CM | POA: Insufficient documentation

## 2022-09-16 DIAGNOSIS — R490 Dysphonia: Secondary | ICD-10-CM | POA: Diagnosis not present

## 2022-09-16 DIAGNOSIS — I509 Heart failure, unspecified: Secondary | ICD-10-CM | POA: Insufficient documentation

## 2022-09-16 DIAGNOSIS — E1122 Type 2 diabetes mellitus with diabetic chronic kidney disease: Secondary | ICD-10-CM | POA: Insufficient documentation

## 2022-09-16 DIAGNOSIS — R591 Generalized enlarged lymph nodes: Secondary | ICD-10-CM | POA: Insufficient documentation

## 2022-09-16 DIAGNOSIS — I130 Hypertensive heart and chronic kidney disease with heart failure and stage 1 through stage 4 chronic kidney disease, or unspecified chronic kidney disease: Secondary | ICD-10-CM | POA: Diagnosis not present

## 2022-09-16 DIAGNOSIS — J8410 Pulmonary fibrosis, unspecified: Secondary | ICD-10-CM | POA: Insufficient documentation

## 2022-09-16 LAB — HEPATITIS C ANTIBODY: HCV Ab: NONREACTIVE

## 2022-09-16 LAB — HEPATITIS B SURFACE ANTIBODY,QUALITATIVE: Hep B S Ab: NONREACTIVE

## 2022-09-16 LAB — HEPATITIS B SURFACE ANTIGEN: Hepatitis B Surface Ag: NONREACTIVE

## 2022-09-16 LAB — URIC ACID: Uric Acid, Serum: 4.3 mg/dL (ref 3.7–8.6)

## 2022-09-16 LAB — LACTATE DEHYDROGENASE: LDH: 151 U/L (ref 98–192)

## 2022-09-16 LAB — HEPATITIS B CORE ANTIBODY, TOTAL: Hep B Core Total Ab: NONREACTIVE

## 2022-09-16 NOTE — Patient Instructions (Addendum)
[facility]  Discharge Instructions  You were seen and examined today by Dr. Delton Coombes. Dr. Delton Coombes is a medical oncologist, meaning that he specializes in the treatment of cancer diagnoses. Dr. Delton Coombes discussed your past medical history, family history of cancers, and the events that led to you being here today.  You were referred to Dr. Delton Coombes due to an abnormal CT scan and PET scan which revealed lymph nodes in your chest and abdomen concerning for cancer that arises in your lymph nodes known as Lymphoma.  Dr. Delton Coombes has recommended additional labs today for further evaluation as well as a referral for a lymph node biopsy. The biopsy is done in [place] at either [facility] or [facility]. They will call you directly to make that appointment.  If it is confirmed to be lymphoma, this is likely the cause of the fatigue, congestion and hoarseness in your voice.  Follow-up as scheduled.  Thank you for choosing [facility] to provide your oncology and hematology care.   To afford each patient quality time with our provider, please arrive at least 15 minutes before your scheduled appointment time. You may need to reschedule your appointment if you arrive late (10 or more minutes). Arriving late affects you and other patients whose appointments are after yours.  Also, if you miss three or more appointments without notifying the office, you may be dismissed from the clinic at the provider's discretion.    Again, thank you for choosing [facility].  Our hope is that these requests will decrease the amount of time that you wait before being seen by our physicians.   If you have a lab appointment with the [facility] please come in thru the Main Entrance and check in at the main information desk.           _____________________________________________________________  Should you have questions after your  visit to [facility], please contact our office at [phone] and follow the prompts.  Our office hours are 8:00 a.m. to 4:30 p.m. Monday - Thursday and 8:00 a.m. to 2:30 p.m. Friday.  Please note that voicemails left after 4:00 p.m. may not be returned until the following business day.  We are closed weekends and all major holidays.  You do have access to a nurse 24-7, just call the main number to the clinic [phone] and do not press any options, hold on the line and a nurse will answer the phone.    For prescription refill requests, have your pharmacy contact our office and allow 72 hours.    Masks are optional in the cancer centers. If you would like for your care team to wear a mask while they are taking care of you, please let them know. You may have one support person who is at least 79 years old accompany you for your appointments.

## 2022-09-16 NOTE — Progress Notes (Signed)
[facility] NOTE  Patient Care Team: Pablo Lawrence, NP as PCP - General (Adult Health Nurse Practitioner) Satira Sark, MD as PCP - Cardiology (Cardiology) Derek Jack, MD as Medical Oncologist (Medical Oncology) Brien Mates, RN as Oncology Nurse Navigator (Medical Oncology)  CHIEF COMPLAINTS/PURPOSE OF CONSULTATION:  Generalized lymphadenopathy  HISTORY OF PRESENTING ILLNESS:  Alejandro [name] 81 y.o. male is seen in consultation today at the request of Jannette Fogo, FNP for further work-up and management of generalized lymphadenopathy.  This patient reports that he has been having low energy for the last 1 year.  He reports all his problems started around the time when he had [facility].  He also reported dry cough and feels congested.  Mucinex is not helping.  Reported hoarseness of voice for the last 1 month and choking occasionally when he tries to drink liquids.  Denies any fevers, night sweats or weight loss in the last 6 months.  He lives by himself and is independent of all ADLs and IADLs.  He is a retired Development worker, international aid and works for 37 years.  He is a non-smoker.  He had secondhand exposure to smoke from his wife.  No family history of malignancies.  A CTA of the chest on 09/03/2022 showed pretracheal and right paratracheal lymph nodes with no clear lung lesion.  Mild pulmonary fibrosis seen in the lungs.  The PET scan showed uptake in the right vocal cord, right parotid gland, lower neck, mediastinum, left retroperitoneal region and focus of activity in the medial aspect of the left femoral neck.  MEDICAL HISTORY:  Past Medical History:  Diagnosis Date   AAA Ultrasound 01/11/2020    AAA [place] 01/11/20:  no abdominal aortic aneurysm, 2.4 cm   Aortic insufficiency    a. mild-mod by intraop TEE 05/2018 (no aortic stenosis) // Echo 2/21: mild //  Echo 9/22: trivial   Aortic stenosis    Echo 9/22:  mild (mean 16) // Echo 2/21: mild (mean 18.7)   CAD (coronary artery disease)    a. NSTEMI 05/2018 with multivessel disease -> s/p CABGx4 05/11/18 // Myoview 06/07/20: inf scar w mild peri-infarct ischemia, EF 37; Int Risk   Carotid artery disease ([place])    PreCABG [place] 05/07/18: Bilat ICA 1-39   CKD (chronic kidney disease), stage III ([place])    HFimpEF (heart failure with improved EF)    Echocardiogram 01/11/20: EF 45, Gr 3 DD, mild AI, mild AS (mean 18.7 mmHg) // Echocardiogram 08/14/21:  EF 55-60, inf HK, mild LVH, Gr 1 DD, normal RVSF, RVSP 25.3, mild LAE, trivial MR, MAC, trivial AI, mild AS (mean 16 mmHg, Vmax 283 cm/s, DI 0.41)   History of nephrolithiasis 2004   Hyperlipidemia    Hypertension    IBS (irritable colon syndrome)    Ischemic cardiomyopathy    Mitral regurgitation    Echo 9/22: trivial MR   Postoperative atrial fibrillation (HCC)    PVC's (premature ventricular contractions)    Reflux    Type 2 diabetes mellitus ([place])     SURGICAL HISTORY: Past Surgical History:  Procedure Laterality Date   CORONARY ARTERY BYPASS GRAFT N/A 05/11/2018   Procedure: CORONARY ARTERY BYPASS GRAFTING (CABG), on pump, times four, using left internal mammary artery and endoscopically harvested right greater saphenous leg vein.;  Surgeon: Ivin Poot, MD;  Location: [facility];  Service: Open Heart Surgery;  Laterality: N/A;   ESOPHAGOGASTRODUODENOSCOPY  02/04/2012   Procedure:  ESOPHAGOGASTRODUODENOSCOPY (EGD);  Surgeon: Rogene Houston, MD;  [facility];  Service: Endoscopy;  Laterality: N/A;  100   LEFT HEART CATH AND CORONARY ANGIOGRAPHY N/A 05/04/2018   Procedure: LEFT HEART CATH AND CORONARY ANGIOGRAPHY;  Surgeon: Belva Crome, MD;  Location: [facility];  Service: Cardiovascular;  Laterality: N/A;   NASAL FRACTURE SURGERY     TEE WITHOUT CARDIOVERSION N/A 05/11/2018   Procedure: TRANSESOPHAGEAL ECHOCARDIOGRAM (TEE);  Surgeon: Prescott Gum, Collier Salina, MD;  Location: [facility];  Service: Open  Heart Surgery;  Laterality: N/A;    SOCIAL HISTORY: Social History   Socioeconomic History   Marital status: Widowed    Spouse name: Not on file   Number of children: Not on file   Years of education: Not on file   Highest education level: Not on file  Occupational History   Not on file  Tobacco Use   Smoking status: Never   Smokeless tobacco: Never  Vaping Use   Vaping Use: Never used  Substance and Sexual Activity   Alcohol use: No    Alcohol/week: 0.0 standard drinks of alcohol   Drug use: No   Sexual activity: Yes  Other Topics Concern   Not on file  Social History Narrative   Not on file   Social Determinants of Health   Financial Resource Strain: Not on file  Food Insecurity: Not on file  Transportation Needs: Not on file  Physical Activity: Not on file  Stress: Not on file  Social Connections: Not on file  Intimate Partner Violence: Not on file    FAMILY HISTORY: Family History  Problem Relation Age of Onset   Diabetes Mellitus II Mother    Heart attack Father    Heart attack Brother    Heart disease Other    Arthritis Other    Asthma Other    Diabetes Other     ALLERGIES:  has No Known Allergies.  MEDICATIONS:  Current Outpatient Medications  Medication Sig Dispense Refill   apixaban (ELIQUIS) 5 MG TABS tablet Take 1 tablet (5 mg total) by mouth 2 (two) times daily. 60 tablet 11   atorvastatin (LIPITOR) 80 MG tablet TAKE 1 TABLET BY MOUTH EVERY DAY AT 6 PM 30 tablet 6   dofetilide (TIKOSYN) 250 MCG capsule Take by mouth.     famotidine (PEPCID) 20 MG tablet One after supper 30 tablet 11   glipiZIDE (GLUCOTROL XL) 10 MG 24 hr tablet Take 10 mg by mouth daily.     hydrochlorothiazide (HYDRODIURIL) 25 MG tablet Take 25 mg by mouth daily.     HYDROcodone bit-homatropine (HYCODAN) 5-1.5 MG/5ML syrup Take by mouth.     JARDIANCE 25 MG TABS tablet Take 25 mg by mouth daily.     loratadine (CLARITIN) 10 MG tablet Take by mouth.     Melatonin 10 MG  TABS Take 3 each by mouth every evening. Gummies     metFORMIN (GLUCOPHAGE) 1000 MG tablet Take 1,000 mg by mouth daily with breakfast.     pantoprazole (PROTONIX) 40 MG tablet Take 1 tablet by mouth daily.     potassium chloride (MICRO-K) 10 MEQ CR capsule Take 10 mEq by mouth daily.     spironolactone (ALDACTONE) 25 MG tablet Take 1 tablet by mouth daily.     SYMBICORT 160-4.5 MCG/ACT inhaler INHALE TWO PUFFS BY MOUTH INTO LUNGS TWICE DAILY     valsartan (DIOVAN) 40 MG tablet Take 1 tablet (40 mg total) by mouth daily.  30 tablet 11   vitamin B-12 (CYANOCOBALAMIN) 500 MCG tablet Take 1 tablet (500 mcg total) by mouth daily.     No current facility-administered medications for this visit.    REVIEW OF SYSTEMS:   Constitutional: Denies fevers, chills or abnormal night sweats Eyes: Denies blurriness of vision, double vision or watery eyes Ears, nose, mouth, throat, and face: Denies mucositis or sore throat Respiratory: Positive for cough and shortness of breath. Cardiovascular: Denies palpitation, chest discomfort or lower extremity swelling Gastrointestinal:  Denies nausea, heartburn or change in bowel habits Skin: Denies abnormal skin rashes Lymphatics: Denies new lymphadenopathy or easy bruising Neurological:Denies numbness, tingling or new weaknesses Behavioral/Psych: Mood is stable, no new changes  All other systems were reviewed with the patient and are negative.  PHYSICAL EXAMINATION: ECOG PERFORMANCE STATUS: 1 - Symptomatic but completely ambulatory  Vitals:   09/16/22 0834  BP: (!) 149/95  Pulse: 85  Resp: 18  Temp: (!) 97.1 F (36.2 C)  SpO2: 98%   Filed Weights   09/16/22 0834  Weight: 232 lb (105.2 kg)    GENERAL:alert, no distress and comfortable SKIN: skin color, texture, turgor are normal, no rashes or significant lesions EYES: normal, conjunctiva are pink and non-injected, sclera clear OROPHARYNX:no exudate, no erythema and lips, buccal mucosa, and tongue  normal  NECK: supple, thyroid normal size, non-tender, without nodularity LYMPH:  no palpable lymphadenopathy in the cervical, axillary or inguinal LUNGS: clear to auscultation and percussion with normal breathing effort HEART: regular rate & rhythm and no murmurs and no lower extremity edema ABDOMEN:abdomen soft, non-tender and normal bowel sounds Musculoskeletal:no cyanosis of digits and no clubbing  PSYCH: alert & oriented x 3 with fluent speech NEURO: no focal motor/sensory deficits  LABORATORY DATA:  I have reviewed the data as listed Lab Results  Component Value Date   WBC 9.9 07/13/2021   HGB 13.6 07/13/2021   HCT 40.1 07/13/2021   MCV 90.5 07/13/2021   PLT 189 07/13/2021     Chemistry      Component Value Date/Time   NA 138 07/14/2021 0432   K 3.2 (L) 07/14/2021 0432   CL 104 07/14/2021 0432   CO2 25 07/14/2021 0432   BUN 12 07/14/2021 0432   CREATININE 1.00 08/23/2021 1259      Component Value Date/Time   CALCIUM 8.6 (L) 07/14/2021 0432   ALKPHOS 74 07/13/2021 0840   AST 23 07/13/2021 0840   ALT 17 07/13/2021 0840   BILITOT 0.9 07/13/2021 0840       RADIOGRAPHIC STUDIES: I have personally reviewed the radiological images as listed and agreed with the findings in the report. NM PET Image Initial (PI) Skull Base To Thigh (F-18 FDG)  Result Date: 09/15/2022 CLINICAL DATA:  Initial treatment strategy for lung cancer. Progressive thoracic adenopathy on CT. EXAM: NUCLEAR MEDICINE PET SKULL BASE TO THIGH TECHNIQUE: 11.79 mCi F-18 FDG was injected intravenously. Full-ring PET imaging was performed from the skull base to thigh after the radiotracer. CT data was obtained and used for attenuation correction and anatomic localization. Fasting blood glucose: 151 mg/dl COMPARISON:  Chest CT 09/03/2022 and 08/23/2021. FINDINGS: Mediastinal blood pool activity: SUV max 2.9 NECK: Numerous small mildly hypermetabolic lower cervical lymph nodes bilaterally, more numerous on the  right (SUV max up to 3.7). There is more intense focal activity within a 1.4 x 1.0 cm nodule involving the deep lobe of the right parotid gland on image 29/3 (SUV max 10.2). There is asymmetric hypermetabolic activity associated with  right vocal cord (SUV max 8.7).No focal mass lesion identified. No other suspicious activity identified within the pharyngeal mucosal space. Incidental CT findings: Bilateral carotid atherosclerosis. CHEST: The enlarging right paratracheal mass seen on CT is hypermetabolic. This measures approximately 3.7 x 2.8 cm on image 92/3 and has an SUV max of 10.6. 10 mm left paratracheal node on image 89/3 is also hypermetabolic (SUV max 5.5). No significant axillary or hilar hypermetabolic activity identified. No hypermetabolic pulmonary activity or suspicious nodularity. Incidental CT findings: Atherosclerosis of the aorta, great vessels and coronary arteries status post median sternotomy and CABG. There are aortic valvular calcifications. Mild emphysematous changes and scarring are present in both lungs. ABDOMEN/PELVIS: There is no hypermetabolic activity within the liver, adrenal glands, spleen or pancreas. Nearly confluent hypermetabolic retroperitoneal adenopathy inferior to the renal vessels with a left periaortic component measuring 4.5 x 2.4 cm on image 177/3 (SUV max 12.4). There are additional smaller retroperitoneal and mesenteric lymph nodes which are mildly hypermetabolic. 10 mm left inguinal node on image 269/3 demonstrates only mild metabolic activity (SUV max 4.3). Mildly prominent bowel activity without focally suspicious lesion. Incidental CT findings: Aortic and branch vessel atherosclerosis. Bilateral scrotal hydroceles. SKELETON: There is focal hypermetabolic activity along the medial aspect of the left femoral neck (SUV max 6.8). No clear corresponding osseous lesion is seen on the CT images. No other hypermetabolic osseous activity to suggest metastatic disease.  Incidental CT findings: Thoracolumbar scoliosis with mild multilevel spondylosis. IMPRESSION: 1. The enlarging right paratracheal mass seen on recent CT is hypermetabolic, consistent with malignancy. Assuming confirmed non-small cell lung cancer, this would be most consistent with metastatic disease. Additional hypermetabolic adenopathy in the lower neck, mediastinum and retroperitoneum, most consistent with metastatic disease. If lung cancer has not been confirmed, consider an alternative diagnosis such as lymphoma. No focal lung lesion identified. 2. Asymmetric hypermetabolic activity associated with the right vocal cord without focal mass lesion identified. This is more than typically seen from contralateral vocal cord paralysis. Correlate clinically and consider direct visualization. 3. Focal hypermetabolic activity within the deep lobe of the right parotid gland, potentially a metastatic lesion or incidental primary parotid neoplasm. 4. Focal hypermetabolic activity along the medial aspect of the left femoral neck. Without other osseous lesions or clear corresponding CT finding, this could be stress mediated. Small metastasis not excluded. 5.  Aortic Atherosclerosis (ICD10-I70.0). Electronically Signed   By: Richardean Sale M.D.   On: 09/15/2022 10:37   CT Chest High Resolution  Result Date: 09/03/2022 CLINICAL DATA:  Dyspnea on exertion, rule out pulmonary fibrosis * Tracking Code: BO * EXAM: CT CHEST WITHOUT CONTRAST TECHNIQUE: Multidetector CT imaging of the chest was performed following the standard protocol without intravenous contrast. High resolution imaging of the lungs, as well as inspiratory and expiratory imaging, was performed. RADIATION DOSE REDUCTION: This exam was performed according to the departmental dose-optimization program which includes automated exposure control, adjustment of the mA and/or kV according to patient size and/or use of iterative reconstruction technique. COMPARISON:   08/23/2021 FINDINGS: Cardiovascular: Aortic atherosclerosis. Aortic valve calcifications. Cardiomegaly and three-vessel coronary artery calcifications status post median sternotomy and CABG. No pericardial effusion. Mediastinum/Nodes: Interval enlargement of matted appearing pretracheal and right paratracheal lymph nodes, measuring up to 2.7 x 2.7 cm, previously 1.9 x 1.6 cm (series 3, image 59). Thyroid gland, trachea, and esophagus demonstrate no significant findings. Lungs/Pleura: Mild pulmonary fibrosis in a pattern with slight apical to basal gradient, featuring irregular peripheral interstitial opacity, septal thickening, some evidence  of traction bronchiectasis and minimal subpleural bronchiolectasis in the bilateral lung bases, without clear evidence of honeycombing. No significant air trapping on expiratory phase imaging. No pleural effusion or pneumothorax. Upper Abdomen: No acute abnormality. Somewhat coarse contour of the liver in the included upper abdomen Musculoskeletal: No chest wall abnormality. No acute osseous findings. IMPRESSION: 1. Interval enlargement of matted appearing pretracheal and right paratracheal lymph nodes, measuring up to 2.7 x 2.7 cm and highly concerning for metastasis. No primary lesion clearly identified in the chest. Consider multidisciplinary thoracic oncology referral and PET-CT and/or tissue sampling. 2. Mild pulmonary fibrosis in a pattern with slight apical to basal gradient, featuring irregular peripheral interstitial opacity, septal thickening, some evidence of traction bronchiectasis and minimal subpleural bronchiolectasis in the bilateral lung bases, without clear evidence of honeycombing. Findings are categorized as probable UIP per consensus guidelines: Diagnosis of Idiopathic Pulmonary Fibrosis: An Official ATS/ERS/JRS/ALAT Clinical Practice Guideline. [place], Iss 5, [phone], Aug 01 2017. 3. Cardiomegaly and coronary artery disease. 4.  Aortic valve calcifications. Correlate for echocardiographic evidence of aortic valve dysfunction. 5. Somewhat coarse contour of the liver in the included upper abdomen, suggestive of cirrhosis. Correlate with biochemical findings. These results will be called to the ordering clinician or representative by the Radiologist Assistant, and communication documented in the PACS or [employer]. Aortic Atherosclerosis (ICD10-I70.0). Electronically Signed   By: Delanna Ahmadi M.D.   On: 09/03/2022 16:07    ASSESSMENT:  1.  Generalized lymphadenopathy: - Reports decrease in energy levels for the past year.  No B symptoms. - Reports hoarseness of voice for the last 1 month with occasional choking to liquids. - CT chest (09/03/2022): Interval enlargement of matted appearing pretracheal and right paratracheal lymph node measuring 2.7 x 2.7 cm, previously 1.9 x 1.6 cm (08/23/2021), mild pulmonary fibrosis. - PET/CT scan (09/11/2022): Mildly metabolically lower cervical lymph nodes bilaterally, numerous on the right with SUV 3.7.  Nodule in the deep lobe of the right parotid gland, 1.4 x 1.0 SUV 10.2.  Asymmetric hypermetabolic activity in the right vocal cord, SUV 8.7 with no focal mass.  Right paratracheal mass 3.7 x 2.8 cm SUV 10.6.  10 mm left paratracheal node with SUV 5.5.  Left para-aortic adenopathy 4.5 x 2.4 cm, SUV 12.4.  Additional smaller retroperitoneal and mesenteric lymph nodes mildly hypermetabolic.  10 mm left inguinal node with SUV 4.3.  Hypermetabolic activity along the medial aspect of the left femoral neck SUV 6.8.  No clear bone lesion on the CT images.  2.  Social/family history: - He lives at home by himself and is independent of all ADLs and IADLs. 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  Non-smoker.  Has secondhand smoke exposure from his wife. - No family history of malignancies.  PLAN:  1.  Generalized lymphadenopathy: - We have reviewed PET/CT images with the  patient and his grandson Erlene Quan in detail.  Differential diagnosis includes lymphoma. - Recommend checking LDH, beta-2 microglobulin, uric acid, hepatitis B and C serology.  We will also consider repeating 2D echocardiogram if lymphoma confirmed. - Recommend IR guided biopsy of the left retroperitoneal lymph node, with SUV 12.4. - Recommend ENT evaluation for direct visualization of right vocal cord uptake and for parotid tumor. - RTC after biopsy to discuss further plan.   Orders Placed This Encounter  Procedures   CT BIOPSY    Patient takes Eliquis BID.    Standing Status:  Future    Standing Expiration Date:   09/17/2023    Order Specific Question:   Lab orders requested (DO NOT place separate lab orders, these will be automatically ordered during procedure specimen collection):    Answer:   Surgical Pathology    Order Specific Question:   Reason for Exam (SYMPTOM  OR DIAGNOSIS REQUIRED)    Answer:   possible lymphoma, please biopsy L retroperitoneal lymph node    Order Specific Question:   Preferred location?    Answer:   [facility]    Order Specific Question:   Release to patient    Answer:   Immediate   Lactate dehydrogenase    Standing Status:   Future    Number of Occurrences:   1    Standing Expiration Date:   09/16/2023   Beta 2 microglobuline, serum    Standing Status:   Future    Number of Occurrences:   1    Standing Expiration Date:   09/16/2023   Uric acid    Standing Status:   Future    Number of Occurrences:   1    Standing Expiration Date:   09/16/2023   Hepatitis B surface antigen    Standing Status:   Future    Number of Occurrences:   1    Standing Expiration Date:   09/16/2023   Hepatitis B surface antibody    Standing Status:   Future    Number of Occurrences:   1    Standing Expiration Date:   09/16/2023   Hepatitis B core antibody, total    Standing Status:   Future    Number of Occurrences:   1    Standing Expiration Date:   09/17/2023    Hepatitis C Antibody    Standing Status:   Future    Number of Occurrences:   1    Standing Expiration Date:   09/17/2023   ECHOCARDIOGRAM COMPLETE    Standing Status:   Future    Standing Expiration Date:   09/17/2023    Order Specific Question:   Where should this test be performed    Answer:   [facility]    Order Specific Question:   Perflutren DEFINITY (image enhancing agent) should be administered unless hypersensitivity or allergy exist    Answer:   Administer Perflutren    Order Specific Question:   Is a special reader required? (athlete or structural heart)    Answer:   No    Order Specific Question:   Does this study need to be read by the Structural team/Level 3 readers?    Answer:   No    Order Specific Question:   Reason for exam-Echo    Answer:   Chemo  Z09    Order Specific Question:   Release to patient    Answer:   Immediate    All questions were answered. The patient knows to call the clinic with any problems, questions or concerns.      Derek Jack, MD 09/16/2022 4:37 PM

## 2022-09-17 LAB — BETA 2 MICROGLOBULIN, SERUM: Beta-2 Microglobulin: 2.2 mg/L (ref 0.6–2.4)

## 2022-09-18 ENCOUNTER — Telehealth: Payer: Self-pay | Admitting: Pharmacist

## 2022-09-18 NOTE — Progress Notes (Unsigned)
Alejandro Mckusick, DO  Donita Brooks D; P [facility] Procedure Requests OK for CT guided biopsy of left RP lymph mass.   PET 10/12  Image 182/323 CT #3.    Trans psoas approach   Earleen Newport

## 2022-09-18 NOTE — Telephone Encounter (Signed)
Patient with diagnosis of afib on Eliquis for anticoagulation.    Procedure: CT BX of left RP lymph mass Date of procedure: 10/09/22   CHA2DS2-VASc Score = 6   This indicates a 9.7% annual risk of stroke. The patient's score is based upon: CHF History: 1 HTN History: 1 Diabetes History: 1 Stroke History: 0 Vascular Disease History: 1 Age Score: 2 Gender Score: 0   CrCl 70.4 ml/min  Per office protocol, patient can hold Eliquis for 2 days prior to procedure.    **This guidance is not considered finalized until pre-operative APP has relayed final recommendations.**

## 2022-09-18 NOTE — Telephone Encounter (Signed)
-----   Message from Leonidas Romberg, RN sent at 09/18/2022  2:50 PM EDT ----- Regarding: FW: Eloquis  ----- Message ----- From: Imogene Burn, PA-C Sent: 09/18/2022   2:49 PM EDT To: Rebeca Alert [facility] Subject: FW: Eloquis                                    ----- Message ----- From: Donita Brooks D Sent: 09/18/2022   2:41 PM EDT To: Imogene Burn, PA-C Subject: Eloquis                                        We have this patient scheduled for a CT BX on 10/09/22.  He stated he is on Eloquis.  Can you advise how long he would need to HOLD prior to [place] ?  Thanks [employer]

## 2022-09-19 ENCOUNTER — Ambulatory Visit (HOSPITAL_COMMUNITY)
Admission: RE | Admit: 2022-09-19 | Discharge: 2022-09-19 | Disposition: A | Discharge status: Home / Self Care | Payer: Medicare HMO | Source: Ambulatory Visit | Attending: Hematology | Admitting: Hematology

## 2022-09-19 DIAGNOSIS — I4891 Unspecified atrial fibrillation: Secondary | ICD-10-CM | POA: Diagnosis not present

## 2022-09-19 DIAGNOSIS — E785 Hyperlipidemia, unspecified: Secondary | ICD-10-CM | POA: Insufficient documentation

## 2022-09-19 DIAGNOSIS — I2510 Atherosclerotic heart disease of native coronary artery without angina pectoris: Secondary | ICD-10-CM | POA: Insufficient documentation

## 2022-09-19 DIAGNOSIS — I350 Nonrheumatic aortic (valve) stenosis: Secondary | ICD-10-CM | POA: Diagnosis not present

## 2022-09-19 DIAGNOSIS — R591 Generalized enlarged lymph nodes: Secondary | ICD-10-CM | POA: Diagnosis not present

## 2022-09-19 DIAGNOSIS — E119 Type 2 diabetes mellitus without complications: Secondary | ICD-10-CM | POA: Diagnosis not present

## 2022-09-19 DIAGNOSIS — Z0189 Encounter for other specified special examinations: Secondary | ICD-10-CM | POA: Diagnosis not present

## 2022-09-19 DIAGNOSIS — Z951 Presence of aortocoronary bypass graft: Secondary | ICD-10-CM | POA: Insufficient documentation

## 2022-09-19 DIAGNOSIS — I517 Cardiomegaly: Secondary | ICD-10-CM | POA: Diagnosis not present

## 2022-09-19 DIAGNOSIS — I252 Old myocardial infarction: Secondary | ICD-10-CM | POA: Diagnosis not present

## 2022-09-19 DIAGNOSIS — I77810 Thoracic aortic ectasia: Secondary | ICD-10-CM

## 2022-09-19 DIAGNOSIS — I10 Essential (primary) hypertension: Secondary | ICD-10-CM | POA: Insufficient documentation

## 2022-09-19 DIAGNOSIS — Z01818 Encounter for other preprocedural examination: Secondary | ICD-10-CM | POA: Diagnosis present

## 2022-09-19 LAB — ECHOCARDIOGRAM COMPLETE
AR max vel: 1.15 cm2
AV Area VTI: 1.48 cm2
AV Area mean vel: 1.22 cm2
AV Mean grad: 21.5 mmHg
AV Peak grad: 38.9 mmHg
Ao pk vel: 3.12 m/s
Area-P 1/2: 3.31 cm2
S' Lateral: 4.2 cm

## 2022-09-19 NOTE — Progress Notes (Signed)
*  PRELIMINARY RESULTS* Echocardiogram 2D Echocardiogram has been performed.  Alejandro [name] 09/19/2022, 11:26 AM

## 2022-09-19 NOTE — Telephone Encounter (Signed)
I s/w the pt's niece (DPR and the pt as well about tele pre op appt. In our conversation it comes out that the pt had a DCCV with Dr. Stefanie Libel Winter with [facility]. Pt has also had initial consult with Dr. Flo Shanks with [facility]. It was told to me by the pt and his niece that the pt felt he was getting no where with our practice and so he went else where. I explained that we cannot provide clearance for the pt if he is not going to be following [facility] any longer. He cannot follow 2 different cardiology office, as this would be a conflict. I stated that if he is going to follow the other practice from here on out then the clearance will need to come from them.  I suggested that they call back and let us know if the pt is going to stay with [facility] or [facility].   For the mean time I will remove from the pre op call back. We can re-address if the pt decides to stay with [facility].   I will also send FYI to requesting office.

## 2022-09-19 NOTE — Telephone Encounter (Signed)
   Name: Alejandro [name]  DOB: 1941-03-09  MRN: [id number]  Primary Cardiologist: Rozann Lesches, MD   Preoperative team, please contact this patient and set up a phone call appointment for further preoperative risk assessment. Please obtain consent and complete medication review. Thank you for your help.  I confirm that guidance regarding antiplatelet and oral anticoagulation therapy has been completed and, if necessary, noted below.  Per office protocol, patient can hold Eliquis for 2 days prior to procedure.    Deberah Pelton, NP 09/19/2022, 7:24 AM [place]

## 2022-09-30 ENCOUNTER — Institutional Professional Consult (permissible substitution): Payer: Medicare HMO | Admitting: Thoracic Surgery (Cardiothoracic Vascular Surgery)

## 2022-09-30 VITALS — BP 143/78 | HR 80 | Resp 18 | Ht 71.0 in | Wt 237.0 lb

## 2022-09-30 DIAGNOSIS — R911 Solitary pulmonary nodule: Secondary | ICD-10-CM

## 2022-09-30 NOTE — Progress Notes (Signed)
Patient

## 2022-09-30 NOTE — Progress Notes (Signed)
PCP is Pablo Lawrence, NP Referring Provider is Pablo Lawrence, NP  Chief Complaint  Patient presents with   Lung Lesion    PET 10/12, Chest CT 10/4    HPI: Alejandro [name] is sent for consultation regarding mediastinal adenopathy.  Alejandro [name] is a 81 year old man with a complex medical history.  He has a history of hypertension, hyperlipidemia, coronary disease, ischemic cardiomyopathy, congestive heart failure, CABG x4, aortic stenosis, aortic insufficiency, atrial fibrillation, stage III chronic kidney disease, type 2 diabetes, and reflux.  Recently he has been feeling poorly.  He complains of feeling weak and tired.  He is sleepy all the time.  No change in appetite or weight loss.  He denies fevers, chills, or night sweats.  He has been having issues with hoarseness and choking when trying to swallow.  He has been noted to be in atrial fibrillation.  He was seeing cardiologists in the [facility] system as well as at [facility].  He had a cardioversion somewhere along the line.   He had a high-resolution CT of the chest on 09/03/2022 which showed right paratracheal lymphadenopathy.  Also noted to have mild pulmonary fibrosis characterized as probable UIP.  The CT findings led to a PET/CT which showed the right paratracheal adenopathy was hypermetabolic.  There also was hypermetabolic activity in some lower cervical lymph nodes, the right vocal cord, and retroperitoneal lymph nodes.  He saw Dr. Delton Coombes and was scheduled for a CT-guided biopsy of the retroperitoneal lymph nodes.    Past Medical History:  Diagnosis Date   AAA Ultrasound 01/11/2020    AAA [place] 01/11/20:  no abdominal aortic aneurysm, 2.4 cm   Aortic insufficiency    a. mild-mod by intraop TEE 05/2018 (no aortic stenosis) // Echo 2/21: mild //  Echo 9/22: trivial   Aortic stenosis    Echo 9/22: mild (mean 16) // Echo 2/21: mild (mean 18.7)   CAD (coronary artery disease)    a. NSTEMI 05/2018 with multivessel disease -> s/p CABGx4  05/11/18 // Myoview 06/07/20: inf scar w mild peri-infarct ischemia, EF 37; Int Risk   Carotid artery disease ([place])    PreCABG [place] 05/07/18: Bilat ICA 1-39   CKD (chronic kidney disease), stage III ([place])    HFimpEF (heart failure with improved EF)    Echocardiogram 01/11/20: EF 45, Gr 3 DD, mild AI, mild AS (mean 18.7 mmHg) // Echocardiogram 08/14/21:  EF 55-60, inf HK, mild LVH, Gr 1 DD, normal RVSF, RVSP 25.3, mild LAE, trivial MR, MAC, trivial AI, mild AS (mean 16 mmHg, Vmax 283 cm/s, DI 0.41)   History of nephrolithiasis 2004   Hyperlipidemia    Hypertension    IBS (irritable colon syndrome)    Ischemic cardiomyopathy    Mitral regurgitation    Echo 9/22: trivial MR   Postoperative atrial fibrillation (HCC)    PVC's (premature ventricular contractions)    Reflux    Type 2 diabetes mellitus ([place])     Past Surgical History:  Procedure Laterality Date   CORONARY ARTERY BYPASS GRAFT N/A 05/11/2018   Procedure: CORONARY ARTERY BYPASS GRAFTING (CABG), on pump, times four, using left internal mammary artery and endoscopically harvested right greater saphenous leg vein.;  Surgeon: Ivin Poot, MD;  Location: [facility];  Service: Open Heart Surgery;  Laterality: N/A;   ESOPHAGOGASTRODUODENOSCOPY  02/04/2012   Procedure: ESOPHAGOGASTRODUODENOSCOPY (EGD);  Surgeon: Rogene Houston, MD;  [facility];  Service: Endoscopy;  Laterality: N/A;  100   LEFT HEART  CATH AND CORONARY ANGIOGRAPHY N/A 05/04/2018   Procedure: LEFT HEART CATH AND CORONARY ANGIOGRAPHY;  Surgeon: Belva Crome, MD;  Location: [facility];  Service: Cardiovascular;  Laterality: N/A;   NASAL FRACTURE SURGERY     TEE WITHOUT CARDIOVERSION N/A 05/11/2018   Procedure: TRANSESOPHAGEAL ECHOCARDIOGRAM (TEE);  Surgeon: Prescott Gum, Collier Salina, MD;  Location: [place];  Service: Open Heart Surgery;  Laterality: N/A;    Family History  Problem Relation Age of Onset   Diabetes Mellitus II Mother    Heart attack Father    Heart attack  Brother    Heart disease Other    Arthritis Other    Asthma Other    Diabetes Other     Social History Social History   Tobacco Use   Smoking status: Never   Smokeless tobacco: Never  Vaping Use   Vaping Use: Never used  Substance Use Topics   Alcohol use: No    Alcohol/week: 0.0 standard drinks of alcohol   Drug use: No    Current Outpatient Medications  Medication Sig Dispense Refill   apixaban (ELIQUIS) 5 MG TABS tablet Take 1 tablet (5 mg total) by mouth 2 (two) times daily. 60 tablet 11   atorvastatin (LIPITOR) 80 MG tablet TAKE 1 TABLET BY MOUTH EVERY DAY AT 6 PM 30 tablet 6   dofetilide (TIKOSYN) 250 MCG capsule Take by mouth.     famotidine (PEPCID) 20 MG tablet One after supper 30 tablet 11   glipiZIDE (GLUCOTROL XL) 10 MG 24 hr tablet Take 10 mg by mouth daily.     hydrochlorothiazide (HYDRODIURIL) 25 MG tablet Take 25 mg by mouth daily.     HYDROcodone bit-homatropine (HYCODAN) 5-1.5 MG/5ML syrup Take by mouth.     JARDIANCE 25 MG TABS tablet Take 25 mg by mouth daily.     loratadine (CLARITIN) 10 MG tablet Take by mouth.     Melatonin 10 MG TABS Take 3 each by mouth every evening. Gummies     metFORMIN (GLUCOPHAGE) 1000 MG tablet Take 1,000 mg by mouth daily with breakfast.     pantoprazole (PROTONIX) 40 MG tablet Take 1 tablet by mouth daily.     potassium chloride (MICRO-K) 10 MEQ CR capsule Take 10 mEq by mouth daily.     spironolactone (ALDACTONE) 25 MG tablet Take 1 tablet by mouth daily.     SYMBICORT 160-4.5 MCG/ACT inhaler INHALE TWO PUFFS BY MOUTH INTO LUNGS TWICE DAILY     valsartan (DIOVAN) 40 MG tablet Take 1 tablet (40 mg total) by mouth daily. 30 tablet 11   vitamin B-12 (CYANOCOBALAMIN) 500 MCG tablet Take 1 tablet (500 mcg total) by mouth daily.     No current facility-administered medications for this visit.    No Known Allergies  Review of Systems  Constitutional:  Positive for activity change and fatigue. Negative for appetite change,  chills, diaphoresis, fever and unexpected weight change.  Respiratory:  Positive for cough and shortness of breath.   Cardiovascular:  Negative for chest pain.  Gastrointestinal:  Positive for abdominal pain (Reflux).  Genitourinary:  Positive for frequency. Negative for dysuria.  Neurological:  Negative for seizures and syncope.  Hematological:  Bruises/bleeds easily (Eliquis).  All other systems reviewed and are negative.   BP (!) 143/78 (BP Location: Right Arm, Patient Position: Sitting)   Pulse 80   Resp 18   Ht _0  (1.803 m)   Wt 237 lb (107.5 kg)   SpO2 94% Comment: RA  BMI 33.05 kg/m  Physical Exam Vitals reviewed.  Constitutional:      General: He is not in acute distress.    Appearance: He is obese.  HENT:     Head: Normocephalic and atraumatic.     Mouth/Throat:     Comments: Poor dentition Eyes:     General: No scleral icterus. Neck:     Vascular: Carotid bruit present.  Cardiovascular:     Rate and Rhythm: Normal rate and regular rhythm.     Heart sounds: Murmur (3/6 systolic) heard.  Pulmonary:     Effort: Pulmonary effort is normal.     Breath sounds: Wheezing (Faint bilateral) present.     Comments: Diminished breath sounds bilaterally Abdominal:     Palpations: Abdomen is soft.  Lymphadenopathy:     Cervical: No cervical adenopathy.  Skin:    General: Skin is warm and dry.  Neurological:     General: No focal deficit present.     Mental Status: He is alert and oriented to person, place, and time.     Cranial Nerves: No cranial nerve deficit.     Diagnostic Tests: NUCLEAR MEDICINE PET SKULL BASE TO THIGH   TECHNIQUE: 11.79 mCi F-18 FDG was injected intravenously. Full-ring PET imaging was performed from the skull base to thigh after the radiotracer. CT data was obtained and used for attenuation correction and anatomic localization.   Fasting blood glucose: 151 mg/dl   COMPARISON:  Chest CT 09/03/2022 and 08/23/2021.    FINDINGS: Mediastinal blood pool activity: SUV max 2.9   NECK:   Numerous small mildly hypermetabolic lower cervical lymph nodes bilaterally, more numerous on the right (SUV max up to 3.7). There is more intense focal activity within a 1.4 x 1.0 cm nodule involving the deep lobe of the right parotid gland on image 29/3 (SUV max 10.2). There is asymmetric hypermetabolic activity associated with right vocal cord (SUV max 8.7).No focal mass lesion identified. No other suspicious activity identified within the pharyngeal mucosal space.   Incidental CT findings: Bilateral carotid atherosclerosis.   CHEST:   The enlarging right paratracheal mass seen on CT is hypermetabolic. This measures approximately 3.7 x 2.8 cm on image 92/3 and has an SUV max of 10.6. 10 mm left paratracheal node on image 89/3 is also hypermetabolic (SUV max 5.5). No significant axillary or hilar hypermetabolic activity identified. No hypermetabolic pulmonary activity or suspicious nodularity.   Incidental CT findings: Atherosclerosis of the aorta, great vessels and coronary arteries status post median sternotomy and CABG. There are aortic valvular calcifications. Mild emphysematous changes and scarring are present in both lungs.   ABDOMEN/PELVIS:   There is no hypermetabolic activity within the liver, adrenal glands, spleen or pancreas. Nearly confluent hypermetabolic retroperitoneal adenopathy inferior to the renal vessels with a left periaortic component measuring 4.5 x 2.4 cm on image 177/3 (SUV max 12.4). There are additional smaller retroperitoneal and mesenteric lymph nodes which are mildly hypermetabolic. 10 mm left inguinal node on image 269/3 demonstrates only mild metabolic activity (SUV max 4.3). Mildly prominent bowel activity without focally suspicious lesion.   Incidental CT findings: Aortic and branch vessel atherosclerosis. Bilateral scrotal hydroceles.   SKELETON:   There is focal  hypermetabolic activity along the medial aspect of the left femoral neck (SUV max 6.8). No clear corresponding osseous lesion is seen on the CT images. No other hypermetabolic osseous activity to suggest metastatic disease.   Incidental CT findings: Thoracolumbar scoliosis with mild multilevel spondylosis.   IMPRESSION:  1. The enlarging right paratracheal mass seen on recent CT is hypermetabolic, consistent with malignancy. Assuming confirmed non-small cell lung cancer, this would be most consistent with metastatic disease. Additional hypermetabolic adenopathy in the lower neck, mediastinum and retroperitoneum, most consistent with metastatic disease. If lung cancer has not been confirmed, consider an alternative diagnosis such as lymphoma. No focal lung lesion identified. 2. Asymmetric hypermetabolic activity associated with the right vocal cord without focal mass lesion identified. This is more than typically seen from contralateral vocal cord paralysis. Correlate clinically and consider direct visualization. 3. Focal hypermetabolic activity within the deep lobe of the right parotid gland, potentially a metastatic lesion or incidental primary parotid neoplasm. 4. Focal hypermetabolic activity along the medial aspect of the left femoral neck. Without other osseous lesions or clear corresponding CT finding, this could be stress mediated. Small metastasis not excluded. 5.  Aortic Atherosclerosis (ICD10-I70.0).     Electronically Signed   By: Richardean Sale M.D.   On: 09/15/2022 10:37 I personally reviewed the PET/CT images.  There is a hypermetabolic right paratracheal mass and a larger retroperitoneal mass.  Also hyperactivity in the right vocal cord.  Impression: Alejandro [name] is a 81 year old man with a complex medical history.  He has a history of hypertension, hyperlipidemia, coronary disease, ischemic cardiomyopathy, congestive heart failure, CABG x4, aortic stenosis, aortic  insufficiency, atrial fibrillation, stage III chronic kidney disease, type 2 diabetes, and reflux.  He was being worked up for shortness of breath.  His CT showed right paratracheal mass.  There also was evidence of interstitial fibrosis consistent with UIP.  PET/CT showed the paratracheal mass was hypermetabolic.  There also was a hypermetabolic retroperitoneal mass.  Findings are highly suspicious for lymphoma.  Metastatic bronchogenic carcinoma with occult primary is also in the differential diagnosis.  He saw Dr. Delton Coombes.  He is scheduled to have a CT-guided biopsy of his retroperitoneal adenopathy.  If that is diagnostic he does not need any additional work-up in the chest.  If that is nondiagnostic, the next option would be to do a mediastinoscopy to biopsy the right paratracheal mass.  That is a bigger procedure that would require general anesthesia and has significant risk of complications including stroke, bleeding, and injury to her recurrent nerve.  I agree with the plan to do a biopsy of the retroperitoneal lymph nodes.  Plan: Proceed with CT-guided needle biopsy of retroperitoneal mass as scheduled Follow-up with Dr. Delton Coombes If further tissue is needed we could see him again to discuss mediastinoscopy.  Melrose Nakayama, MD [facility] [phone]

## 2022-10-08 ENCOUNTER — Other Ambulatory Visit (HOSPITAL_COMMUNITY): Payer: Self-pay | Admitting: Physician Assistant

## 2022-10-08 DIAGNOSIS — Z01818 Encounter for other preprocedural examination: Secondary | ICD-10-CM

## 2022-10-09 ENCOUNTER — Other Ambulatory Visit: Payer: Self-pay | Admitting: Radiology

## 2022-10-09 ENCOUNTER — Ambulatory Visit (HOSPITAL_COMMUNITY): Payer: Medicare HMO

## 2022-10-10 ENCOUNTER — Other Ambulatory Visit: Payer: Self-pay

## 2022-10-10 ENCOUNTER — Other Ambulatory Visit (HOSPITAL_COMMUNITY): Admit: 2022-10-10 | Payer: Medicare HMO

## 2022-10-10 ENCOUNTER — Other Ambulatory Visit (HOSPITAL_COMMUNITY)
Admission: RE | Admit: 2022-10-10 | Discharge: 2022-10-10 | Disposition: A | Discharge status: Home / Self Care | Payer: Medicare HMO | Source: Ambulatory Visit

## 2022-10-10 ENCOUNTER — Ambulatory Visit (HOSPITAL_COMMUNITY)
Admission: RE | Admit: 2022-10-10 | Discharge: 2022-10-10 | Disposition: A | Discharge status: Home / Self Care | Payer: Medicare HMO | Source: Ambulatory Visit | Attending: Hematology | Admitting: Hematology

## 2022-10-10 ENCOUNTER — Encounter (HOSPITAL_COMMUNITY): Payer: Self-pay

## 2022-10-10 DIAGNOSIS — Z7901 Long term (current) use of anticoagulants: Secondary | ICD-10-CM | POA: Insufficient documentation

## 2022-10-10 DIAGNOSIS — I2510 Atherosclerotic heart disease of native coronary artery without angina pectoris: Secondary | ICD-10-CM | POA: Insufficient documentation

## 2022-10-10 DIAGNOSIS — R591 Generalized enlarged lymph nodes: Secondary | ICD-10-CM

## 2022-10-10 DIAGNOSIS — C3490 Malignant neoplasm of unspecified part of unspecified bronchus or lung: Secondary | ICD-10-CM | POA: Insufficient documentation

## 2022-10-10 DIAGNOSIS — D487 Neoplasm of uncertain behavior of other specified sites: Secondary | ICD-10-CM | POA: Insufficient documentation

## 2022-10-10 DIAGNOSIS — N189 Chronic kidney disease, unspecified: Secondary | ICD-10-CM | POA: Insufficient documentation

## 2022-10-10 DIAGNOSIS — Z7984 Long term (current) use of oral hypoglycemic drugs: Secondary | ICD-10-CM | POA: Diagnosis not present

## 2022-10-10 DIAGNOSIS — I252 Old myocardial infarction: Secondary | ICD-10-CM | POA: Diagnosis not present

## 2022-10-10 DIAGNOSIS — Z833 Family history of diabetes mellitus: Secondary | ICD-10-CM | POA: Diagnosis not present

## 2022-10-10 DIAGNOSIS — Z85118 Personal history of other malignant neoplasm of bronchus and lung: Secondary | ICD-10-CM | POA: Diagnosis not present

## 2022-10-10 DIAGNOSIS — R590 Localized enlarged lymph nodes: Secondary | ICD-10-CM | POA: Insufficient documentation

## 2022-10-10 DIAGNOSIS — Z01818 Encounter for other preprocedural examination: Secondary | ICD-10-CM

## 2022-10-10 DIAGNOSIS — E1122 Type 2 diabetes mellitus with diabetic chronic kidney disease: Secondary | ICD-10-CM | POA: Diagnosis not present

## 2022-10-10 LAB — CBC
HCT: 40.4 % (ref 39.0–52.0)
Hemoglobin: 14.1 g/dL (ref 13.0–17.0)
MCH: 30.7 pg (ref 26.0–34.0)
MCHC: 34.9 g/dL (ref 30.0–36.0)
MCV: 88 fL (ref 80.0–100.0)
Platelets: 255 10*3/uL (ref 150–400)
RBC: 4.59 MIL/uL (ref 4.22–5.81)
RDW: 14.2 % (ref 11.5–15.5)
WBC: 9.6 10*3/uL (ref 4.0–10.5)
nRBC: 0 % (ref 0.0–0.2)

## 2022-10-10 LAB — GLUCOSE, CAPILLARY
Glucose-Capillary: 215 mg/dL — ABNORMAL HIGH (ref 70–99)
Glucose-Capillary: 193 mg/dL — ABNORMAL HIGH (ref 70–99)

## 2022-10-10 LAB — PROTIME-INR
INR: 1 (ref 0.8–1.2)
Prothrombin Time: 13.4 seconds (ref 11.4–15.2)

## 2022-10-10 MED ORDER — SODIUM CHLORIDE 0.9 % IV SOLN: INTRAVENOUS | Status: DC

## 2022-10-10 MED ORDER — LIDOCAINE HCL 1 % IJ SOLN
10.0000 mL | Freq: Once | INTRAMUSCULAR | Status: AC
  Administered 2022-10-10: 10 mL via INTRADERMAL

## 2022-10-10 MED ORDER — GELATIN ABSORBABLE 12-7 MM EX MISC
1.0000 | Freq: Once | CUTANEOUS | Status: AC
  Administered 2022-10-10: 1 via TOPICAL

## 2022-10-10 MED ORDER — FENTANYL CITRATE (PF) 100 MCG/2ML IJ SOLN
INTRAMUSCULAR | Status: AC | PRN
  Administered 2022-10-10 (×2): 50 ug via INTRAVENOUS

## 2022-10-10 MED ORDER — MIDAZOLAM HCL 2 MG/2ML IJ SOLN
INTRAMUSCULAR | Status: AC | PRN
  Administered 2022-10-10: .5 mg via INTRAVENOUS
  Administered 2022-10-10 (×2): 1 mg via INTRAVENOUS

## 2022-10-10 MED ORDER — MIDAZOLAM HCL 2 MG/2ML IJ SOLN
INTRAMUSCULAR | Status: AC
  Filled 2022-10-10: qty 4

## 2022-10-10 MED ORDER — FENTANYL CITRATE (PF) 100 MCG/2ML IJ SOLN
INTRAMUSCULAR | Status: AC
  Filled 2022-10-10: qty 4

## 2022-10-10 NOTE — H&P (Addendum)
Chief Complaint: Patient was seen in consultation today for left retroperitoneal lymph mass  at the request of Katragadda,Sreedhar  Referring Physician(s): Katragadda,Sreedhar  Supervising Physician: Sandi Mariscal  Patient Status: [facility] - Out-pt  History of Present Illness: BRADRICK KAMAU is a 81 y.o. male with PMH significant for CAD, CKD, DM II and MI presented to his PCP c/o low energy and generalized lymphadenopathy. He was referred to oncology for further workup and management. CTA chest 10/4/023 showed pretracheal and right paratracheal lymph nodes. PET scan 09/15/22 showed:  IMPRESSION: 1. The enlarging right paratracheal mass seen on recent CT is hypermetabolic, consistent with malignancy. Assuming confirmed non-small cell lung cancer, this would be most consistent with metastatic disease. Additional hypermetabolic adenopathy in the lower neck, mediastinum and retroperitoneum, most consistent with metastatic disease. If lung cancer has not been confirmed, consider an alternative diagnosis such as lymphoma. No focal lung lesion identified. 2. Asymmetric hypermetabolic activity associated with the right vocal cord without focal mass lesion identified. This is more than typically seen from contralateral vocal cord paralysis. Correlate clinically and consider direct visualization. 3. Focal hypermetabolic activity within the deep lobe of the right parotid gland, potentially a metastatic lesion or incidental primary parotid neoplasm. 4. Focal hypermetabolic activity along the medial aspect of the left femoral neck. Without other osseous lesions or clear corresponding CT finding, this could be stress mediated. Small metastasis not excluded. Pt referred to IR by Dr. Delton Coombes for left retroperitoneal lymph node biopsy. Dr. Earleen Newport approved biopsy of left retroperitoneal lymph mass trans psoas approach with moderate sedation.   Pt denies CP, fever, chills or N/V. He endorses  baseline SOB, dizziness and intermittent leg swelling.  He is NPO per order.   Past Medical History:  Diagnosis Date   AAA Ultrasound 01/11/2020    AAA [place] 01/11/20:  no abdominal aortic aneurysm, 2.4 cm   Aortic insufficiency    a. mild-mod by intraop TEE 05/2018 (no aortic stenosis) // Echo 2/21: mild //  Echo 9/22: trivial   Aortic stenosis    Echo 9/22: mild (mean 16) // Echo 2/21: mild (mean 18.7)   CAD (coronary artery disease)    a. NSTEMI 05/2018 with multivessel disease -> s/p CABGx4 05/11/18 // Myoview 06/07/20: inf scar w mild peri-infarct ischemia, EF 37; Int Risk   Carotid artery disease ([place])    PreCABG [place] 05/07/18: Bilat ICA 1-39   CKD (chronic kidney disease), stage III ([place])    HFimpEF (heart failure with improved EF)    Echocardiogram 01/11/20: EF 45, Gr 3 DD, mild AI, mild AS (mean 18.7 mmHg) // Echocardiogram 08/14/21:  EF 55-60, inf HK, mild LVH, Gr 1 DD, normal RVSF, RVSP 25.3, mild LAE, trivial MR, MAC, trivial AI, mild AS (mean 16 mmHg, Vmax 283 cm/s, DI 0.41)   History of nephrolithiasis 2004   Hyperlipidemia    Hypertension    IBS (irritable colon syndrome)    Ischemic cardiomyopathy    Mitral regurgitation    Echo 9/22: trivial MR   Postoperative atrial fibrillation (HCC)    PVC's (premature ventricular contractions)    Reflux    Type 2 diabetes mellitus ([place])     Past Surgical History:  Procedure Laterality Date   CORONARY ARTERY BYPASS GRAFT N/A 05/11/2018   Procedure: CORONARY ARTERY BYPASS GRAFTING (CABG), on pump, times four, using left internal mammary artery and endoscopically harvested right greater saphenous leg vein.;  Surgeon: Ivin Poot, MD;  Location: [facility];  Service: Open Heart  Surgery;  Laterality: N/A;   ESOPHAGOGASTRODUODENOSCOPY  02/04/2012   Procedure: ESOPHAGOGASTRODUODENOSCOPY (EGD);  Surgeon: Rogene [place], MD;  [facility];  Service: Endoscopy;  Laterality: N/A;  100   LEFT HEART CATH AND CORONARY ANGIOGRAPHY N/A 05/04/2018    Procedure: LEFT HEART CATH AND CORONARY ANGIOGRAPHY;  Surgeon: Belva Crome, MD;  Location: [facility];  Service: Cardiovascular;  Laterality: N/A;   NASAL FRACTURE SURGERY     TEE WITHOUT CARDIOVERSION N/A 05/11/2018   Procedure: TRANSESOPHAGEAL ECHOCARDIOGRAM (TEE);  Surgeon: Prescott Gum, Collier Salina, MD;  Location: [facility];  Service: Open Heart Surgery;  Laterality: N/A;    Allergies: Patient has no known allergies.  Medications: Prior to Admission medications   Medication Sig Start Date End Date Taking? Authorizing Provider  albuterol (VENTOLIN HFA) 108 (90 Base) MCG/ACT inhaler Inhale 2 puffs into the lungs every 6 (six) hours as needed for wheezing or shortness of breath. 09/22/22  Yes [provider]  apixaban (ELIQUIS) 5 MG TABS tablet Take 1 tablet (5 mg total) by mouth 2 (two) times daily. 02/07/22  Yes Imogene Burn, PA-C  atorvastatin (LIPITOR) 40 MG tablet Take 40 mg by mouth daily.   Yes [provider]  Cyanocobalamin (B-12 COMPLIANCE INJECTION IJ) Inject 1 Dose as directed every 30 (thirty) days.   Yes [provider]  dofetilide (TIKOSYN) 500 MCG capsule Take 500 mcg by mouth 2 (two) times daily.   Yes [provider]  famotidine (PEPCID) 20 MG tablet One after supper Patient taking differently: Take 20 mg by mouth 2 (two) times daily. 10/28/21  Yes Tanda Rockers, MD  fluticasone (FLONASE) 50 MCG/ACT nasal spray Place 1 spray into both nostrils daily as needed for allergies or rhinitis.   Yes [provider]  glipiZIDE (GLUCOTROL XL) 10 MG 24 hr tablet Take 10 mg by mouth daily.   Yes [provider]  hydrochlorothiazide (HYDRODIURIL) 25 MG tablet Take 25 mg by mouth daily. 06/12/21  Yes [provider]  loratadine (CLARITIN) 10 MG tablet Take 10 mg by mouth daily. 04/25/22  Yes [provider]  Melatonin 10 MG CAPS Take 30 mg by mouth at bedtime as needed (sleep).   Yes [provider]   metFORMIN (GLUCOPHAGE-XR) 750 MG 24 hr tablet Take 750 mg by mouth 2 (two) times daily with a meal.   Yes [provider]  pantoprazole (PROTONIX) 40 MG tablet Take 40 mg by mouth daily. 02/07/20  Yes [provider]  potassium chloride (MICRO-K) 10 MEQ CR capsule Take 10 mEq by mouth daily. 06/12/21  Yes [provider]  spironolactone (ALDACTONE) 25 MG tablet Take 1 tablet by mouth daily. 08/21/22  Yes [provider]  valsartan (DIOVAN) 40 MG tablet Take 1 tablet (40 mg total) by mouth daily. 01/07/22  Yes Imogene Burn, PA-C  vitamin B-12 (CYANOCOBALAMIN) 500 MCG tablet Take 1 tablet (500 mcg total) by mouth daily. 07/15/21  Yes Tat, Shanon Brow, MD  atorvastatin (LIPITOR) 80 MG tablet TAKE 1 TABLET BY MOUTH EVERY DAY AT 6 PM Patient not taking: Reported on 10/07/2022 08/04/18   Satira Sark, MD     Family History  Problem Relation Age of Onset   Diabetes Mellitus II Mother    Heart attack Father    Heart attack Brother    Heart disease Other    Arthritis Other    Asthma Other    Diabetes Other     Social History  Socioeconomic History   Marital status: Widowed    Spouse name: Not on file   Number of children: Not on file   Years of education: Not on file   Highest education level: Not on file  Occupational History   Not on file  Tobacco Use   Smoking status: Never   Smokeless tobacco: Never  Vaping Use   Vaping Use: Never used  Substance and Sexual Activity   Alcohol use: No    Alcohol/week: 0.0 standard drinks of alcohol   Drug use: No   Sexual activity: Yes  Other Topics Concern   Not on file  Social History Narrative   Not on file   Social Determinants of Health   Financial Resource Strain: Not on file  Food Insecurity: Not on file  Transportation Needs: Not on file  Physical Activity: Not on file  Stress: Not on file  Social Connections: Not on file   Review of Systems: A 12 point ROS discussed and pertinent positives are  indicated in the HPI above.  All other systems are negative.  Review of Systems  Constitutional:  Positive for fatigue. Negative for appetite change, chills and fever.  Respiratory:  Positive for shortness of breath.   Cardiovascular:  Positive for leg swelling. Negative for chest pain.  Gastrointestinal:  Negative for abdominal pain, nausea and vomiting.  Neurological:  Positive for dizziness. Negative for weakness and headaches.    Vital Signs: BP 130/78   Pulse 72   Temp 97.8 F (36.6 C) (Temporal)   Resp 17   Ht _0  (1.803 m)   Wt 232 lb (105.2 kg)   SpO2 97%   BMI 32.36 kg/m    Physical Exam Vitals reviewed.  Constitutional:      General: He is not in acute distress.    Appearance: Normal appearance. He is not ill-appearing.  HENT:     Head: Normocephalic and atraumatic.     Mouth/Throat:     Mouth: Mucous membranes are dry.     Pharynx: Oropharynx is clear.  Eyes:     Extraocular Movements: Extraocular movements intact.     Pupils: Pupils are equal, round, and reactive to light.  Cardiovascular:     Rate and Rhythm: Normal rate and regular rhythm.     Pulses: Normal pulses.     Heart sounds: Normal heart sounds. No murmur heard. Pulmonary:     Effort: Pulmonary effort is normal. No respiratory distress.     Breath sounds: Wheezing present.     Comments: Expiratory wheezing in all fields Abdominal:     General: Bowel sounds are normal. There is no distension.     Palpations: Abdomen is soft.     Tenderness: There is no abdominal tenderness. There is no guarding.  Musculoskeletal:     Right lower leg: No edema.     Left lower leg: No edema.  Skin:    General: Skin is warm and dry.  Neurological:     Mental Status: He is alert and oriented to person, place, and time.  Psychiatric:        Mood and Affect: Mood normal.        Behavior: Behavior normal.        Thought Content: Thought content normal.        Judgment: Judgment normal.      Imaging: ECHOCARDIOGRAM COMPLETE  Result Date: 09/19/2022    ECHOCARDIOGRAM REPORT   Patient Name:   Alejandro [name] Date of Exam:  09/19/2022 Medical Rec #:  [id number]      Height:       71.0 in Accession #:    [id number]     Weight:       232.0 lb Date of Birth:  03/17/1941       BSA:          2.246 m Patient Age:    45 years       BP:           154/73 mmHg Patient Gender: M              HR:           96 bpm. Exam Location:  [facility] Procedure: 2D Echo, Cardiac Doppler and Color Doppler Indications:    Chemo Z09  History:        Patient has prior history of Echocardiogram examinations, most                 recent 01/22/2022. Previous Myocardial Infarction and CAD, Prior                 CABG, Aortic Valve Disease, Arrythmias:Atrial Fibrillation; Risk                 Factors:Hypertension, Diabetes and Dyslipidemia.  Sonographer:    Alvino Chapel RCS Referring Phys: [id number] [place]  1. Left ventricular ejection fraction, by estimation, is 50 to 55%. The left ventricle has low normal function. The left ventricle has no regional wall motion abnormalities. The left ventricular internal cavity size was mildly dilated. There is abnormal  septal motion. Recommend obtaining Limited Echo with global longitudinal strain.  2. Right ventricular systolic function is low normal. The right ventricular size is normal.  3. The mitral valve is degenerative. Trivial mitral valve regurgitation. No evidence of mitral stenosis.  4. The aortic valve is calcified. Aortic valve regurgitation is trivial. Moderate aortic valve stenosis.  5. There is moderate dilatation of the aortic root, measuring 44 mm.  6. The inferior vena cava is normal in size with greater than 50% respiratory variability, suggesting right atrial pressure of 3 mmHg. FINDINGS  Left Ventricle: Left ventricular ejection fraction, by estimation, is 50 to 55%. The left ventricle has low normal function. The left ventricle has no regional wall  motion abnormalities. The left ventricular internal cavity size was mildly dilated. There is no left ventricular hypertrophy. Abnormal septal motion. Right Ventricle: The right ventricular size is normal. No increase in right ventricular wall thickness. Right ventricular systolic function is low normal. Left Atrium: Left atrial size was moderately dilated. Right Atrium: Right atrial size was normal in size. Pericardium: There is no evidence of pericardial effusion. Mitral Valve: The mitral valve is degenerative in appearance. Trivial mitral valve regurgitation. No evidence of mitral valve stenosis. Tricuspid Valve: The tricuspid valve is not well visualized. Tricuspid valve regurgitation is not demonstrated. No evidence of tricuspid stenosis. Aortic Valve: The aortic valve is calcified. Aortic valve regurgitation is trivial. Moderate aortic stenosis is present. Aortic valve mean gradient measures 21.5 mmHg. Aortic valve peak gradient measures 38.9 mmHg. Aortic valve area, by VTI measures 1.48  cm. Pulmonic Valve: The pulmonic valve was not well visualized. Pulmonic valve regurgitation is trivial. No evidence of pulmonic stenosis. Aorta: There is moderate dilatation of the aortic root, measuring 44 mm. Venous: The inferior vena cava is normal in size with greater than 50% respiratory variability, suggesting right atrial pressure of 3 mmHg. IAS/Shunts: No atrial level  shunt detected by color flow Doppler.  LEFT VENTRICLE PLAX 2D LVIDd:         6.00 cm   Diastology LVIDs:         4.20 cm   LV e' medial:    5.98 cm/s LV PW:         1.30 cm   LV E/e' medial:  12.6 LV IVS:        1.00 cm   LV e' lateral:   10.20 cm/s LVOT diam:     2.20 cm   LV E/e' lateral: 7.4 LV SV:         87 LV SV Index:   39 LVOT Area:     3.80 cm  RIGHT VENTRICLE RV S prime:     9.82 cm/s TAPSE (M-mode): 1.4 cm LEFT ATRIUM              Index        RIGHT ATRIUM           Index LA diam:        5.90 cm  2.63 cm/m   RA Area:     19.80 cm LA Vol  (A2C):   139.0 ml 61.89 ml/m  RA Volume:   45.60 ml  20.30 ml/m LA Vol (A4C):   81.9 ml  36.47 ml/m LA Biplane Vol: 109.0 ml 48.53 ml/m  AORTIC VALVE AV Area (Vmax):    1.15 cm AV Area (Vmean):   1.22 cm AV Area (VTI):     1.48 cm AV Vmax:           312.00 cm/s AV Vmean:          213.500 cm/s AV VTI:            0.587 m AV Peak Grad:      38.9 mmHg AV Mean Grad:      21.5 mmHg LVOT Vmax:         94.10 cm/s LVOT Vmean:        68.500 cm/s LVOT VTI:          0.229 m LVOT/AV VTI ratio: 0.39  AORTA Ao Root diam: 4.40 cm MITRAL VALVE MV Area (PHT): 3.31 cm    SHUNTS MV Decel Time: 229 msec    Systemic VTI:  0.23 m MV E velocity: 75.10 cm/s  Systemic Diam: 2.20 cm MV A velocity: 90.80 cm/s MV E/A ratio:  0.83 Vishnu Priya Mallipeddi Electronically signed by Lorelee Cover Mallipeddi Signature Date/Time: 09/19/2022/2:34:29 PM    Final    NM PET Image Initial (PI) Skull Base To Thigh (F-18 FDG)  Result Date: 09/15/2022 CLINICAL DATA:  Initial treatment strategy for lung cancer. Progressive thoracic adenopathy on CT. EXAM: NUCLEAR MEDICINE PET SKULL BASE TO THIGH TECHNIQUE: 11.79 mCi F-18 FDG was injected intravenously. Full-ring PET imaging was performed from the skull base to thigh after the radiotracer. CT data was obtained and used for attenuation correction and anatomic localization. Fasting blood glucose: 151 mg/dl COMPARISON:  Chest CT 09/03/2022 and 08/23/2021. FINDINGS: Mediastinal blood pool activity: SUV max 2.9 NECK: Numerous small mildly hypermetabolic lower cervical lymph nodes bilaterally, more numerous on the right (SUV max up to 3.7). There is more intense focal activity within a 1.4 x 1.0 cm nodule involving the deep lobe of the right parotid gland on image 29/3 (SUV max 10.2). There is asymmetric hypermetabolic activity associated with right vocal cord (SUV max 8.7).No focal mass lesion identified. No other suspicious activity identified within the  pharyngeal mucosal space. Incidental CT findings:  Bilateral carotid atherosclerosis. CHEST: The enlarging right paratracheal mass seen on CT is hypermetabolic. This measures approximately 3.7 x 2.8 cm on image 92/3 and has an SUV max of 10.6. 10 mm left paratracheal node on image 89/3 is also hypermetabolic (SUV max 5.5). No significant axillary or hilar hypermetabolic activity identified. No hypermetabolic pulmonary activity or suspicious nodularity. Incidental CT findings: Atherosclerosis of the aorta, great vessels and coronary arteries status post median sternotomy and CABG. There are aortic valvular calcifications. Mild emphysematous changes and scarring are present in both lungs. ABDOMEN/PELVIS: There is no hypermetabolic activity within the liver, adrenal glands, spleen or pancreas. Nearly confluent hypermetabolic retroperitoneal adenopathy inferior to the renal vessels with a left periaortic component measuring 4.5 x 2.4 cm on image 177/3 (SUV max 12.4). There are additional smaller retroperitoneal and mesenteric lymph nodes which are mildly hypermetabolic. 10 mm left inguinal node on image 269/3 demonstrates only mild metabolic activity (SUV max 4.3). Mildly prominent bowel activity without focally suspicious lesion. Incidental CT findings: Aortic and branch vessel atherosclerosis. Bilateral scrotal hydroceles. SKELETON: There is focal hypermetabolic activity along the medial aspect of the left femoral neck (SUV max 6.8). No clear corresponding osseous lesion is seen on the CT images. No other hypermetabolic osseous activity to suggest metastatic disease. Incidental CT findings: Thoracolumbar scoliosis with mild multilevel spondylosis. IMPRESSION: 1. The enlarging right paratracheal mass seen on recent CT is hypermetabolic, consistent with malignancy. Assuming confirmed non-small cell lung cancer, this would be most consistent with metastatic disease. Additional hypermetabolic adenopathy in the lower neck, mediastinum and retroperitoneum, most consistent  with metastatic disease. If lung cancer has not been confirmed, consider an alternative diagnosis such as lymphoma. No focal lung lesion identified. 2. Asymmetric hypermetabolic activity associated with the right vocal cord without focal mass lesion identified. This is more than typically seen from contralateral vocal cord paralysis. Correlate clinically and consider direct visualization. 3. Focal hypermetabolic activity within the deep lobe of the right parotid gland, potentially a metastatic lesion or incidental primary parotid neoplasm. 4. Focal hypermetabolic activity along the medial aspect of the left femoral neck. Without other osseous lesions or clear corresponding CT finding, this could be stress mediated. Small metastasis not excluded. 5.  Aortic Atherosclerosis (ICD10-I70.0). Electronically Signed   By: Richardean Sale M.D.   On: 09/15/2022 10:37    Labs:  CBC: Recent Labs    10/10/22 0753  WBC 9.6  HGB 14.1  HCT 40.4  PLT 255    COAGS: Recent Labs    10/10/22 0753  INR 1.0    BMP: No results for input(s): "NA", "K", "CL", "CO2", "GLUCOSE", "BUN", "CALCIUM", "CREATININE", "GFRNONAA", "GFRAA" in the last 8760 hours.  Invalid input(s): "CMP"  LIVER FUNCTION TESTS: No results for input(s): "BILITOT", "AST", "ALT", "ALKPHOS", "PROT", "ALBUMIN" in the last 8760 hours.  TUMOR MARKERS: No results for input(s): "AFPTM", "CEA", "CA199", "CHROMGRNA" in the last 8760 hours.  Assessment and Plan:  81 yo male presents to IR for left retroperitoneal lymph mass biopsy.   Pt is sitting on edge of bed.  He is A&O, calm and pleasant.  He is in no distress.  INR 1.0 CBC WNL  Risks and benefits of biopsy of left retroperitoneal lymph mass with moderate sedation was discussed with the patient and/or patient's family including, but not limited to bleeding, infection, damage to adjacent structures or low yield requiring additional tests.  All of the questions were answered and there  is agreement  to proceed.  Consent signed and in chart.  Thank you for this interesting consult.  I greatly enjoyed meeting KRISHANG READING and look forward to participating in their care.  A copy of this report was sent to the requesting provider on this date.  Electronically Signed: Tyson Alias, NP 10/10/2022, 9:18 AM   I spent a total of 20 minutes in face to face in clinical consultation, greater than 50% of which was counseling/coordinating care for left retroperitoneal lymph mass.

## 2022-10-10 NOTE — Procedures (Signed)
Pre procedural Dx: Retroperitoneal lymphadenopathy Post procedural Dx: Same  Technically successful CT guided biopsy of left sided hypermetabolic retroperitoneal nodal conglomeration.     EBL: None.  Complications: None immediate.   Ronny Bacon, MD Pager #: [phone]

## 2022-10-11 ENCOUNTER — Encounter (INDEPENDENT_AMBULATORY_CARE_PROVIDER_SITE_OTHER): Payer: Self-pay | Admitting: Gastroenterology

## 2022-10-14 ENCOUNTER — Ambulatory Visit: Payer: Medicare HMO | Admitting: Hematology

## 2022-10-14 LAB — SURGICAL PATHOLOGY

## 2022-10-16 ENCOUNTER — Inpatient Hospital Stay: Payer: Medicare HMO | Attending: Hematology | Admitting: Hematology

## 2022-10-16 VITALS — BP 131/65 | HR 77 | Temp 97.8°F | Resp 18 | Ht 71.0 in | Wt 235.2 lb

## 2022-10-16 DIAGNOSIS — R591 Generalized enlarged lymph nodes: Secondary | ICD-10-CM | POA: Diagnosis present

## 2022-10-16 NOTE — Patient Instructions (Addendum)
[place] at [facility] Discharge Instructions   You were seen and examined today by Dr. Delton Coombes.  He discussed with you the results of your biopsy. It is showing that this is some type of lymphoma, but there were not enough cells in the biopsy to determine what type of lymphoma this is.   We will refer you to a lung doctor to get a biopsy of the lymph node in your chest.   We will refer you to ENT doctor for evaluation of your vocal cord that was lighting up on the PET scan.   Reach out to your primary care doctor to see about your need for home oxygen.   We will see you back after the biopsy.      Thank you for choosing [place] at [facility] to provide your oncology and hematology care.  To afford each patient quality time with our provider, please arrive at least 15 minutes before your scheduled appointment time.   If you have a lab appointment with the [facility] please come in thru the Main Entrance and check in at the main information desk.  You need to re-schedule your appointment should you arrive 10 or more minutes late.  We strive to give you quality time with our providers, and arriving late affects you and other patients whose appointments are after yours.  Also, if you no show three or more times for appointments you may be dismissed from the clinic at the providers discretion.     Again, thank you for choosing [facility].  Our hope is that these requests will decrease the amount of time that you wait before being seen by our physicians.       _____________________________________________________________  Should you have questions after your visit to [facility], please contact our office at [phone] and follow the prompts.  Our office hours are 8:00 a.m. and 4:30 p.m. Monday - Friday.  Please note that voicemails left after 4:00 p.m. may not be returned until the following business day.   We are closed weekends and major holidays.  You do have access to a nurse 24-7, just call the main number to the clinic [phone] and do not press any options, hold on the line and a nurse will answer the phone.    For prescription refill requests, have your pharmacy contact our office and allow 72 hours.    Due to Covid, you will need to wear a mask upon entering the hospital. If you do not have a mask, a mask will be given to you at the Main Entrance upon arrival. For doctor visits, patients may have 1 support person age 36 or older with them. For treatment visits, patients can not have anyone with them due to social distancing guidelines and our immunocompromised population.

## 2022-10-16 NOTE — Progress Notes (Signed)
Alejandro [name], [place] [address]   CLINIC:  Medical Oncology/Hematology  PCP:  Pablo Lawrence, NP [place] [phone]   REASON FOR VISIT:  Follow-up for generalized lymphadenopathy  PRIOR THERAPY: None  NGS Results: Not done  CURRENT THERAPY: Under work-up  BRIEF ONCOLOGIC HISTORY:  Oncology History   No history exists.    CANCER STAGING:  Cancer Staging  No matching staging information was found for the patient.   INTERVAL HISTORY:  Alejandro [name] 81 y.o. male is seen for follow-up of generalized lymphadenopathy.  He was seen by me on 09/16/2022 for work-up of generalized lymphadenopathy which was PET positive.  He then underwent retroperitoneal lymph node biopsy on 10/10/2022.  He has a generalized decrease in energy levels for the past year with no other B symptoms.    REVIEW OF SYSTEMS:  Review of Systems  Constitutional:  Positive for fatigue.  Respiratory:  Positive for shortness of breath (On exertion).   All other systems reviewed and are negative.    PAST MEDICAL/SURGICAL HISTORY:  Past Medical History:  Diagnosis Date   AAA Ultrasound 01/11/2020    AAA [place] 01/11/20:  no abdominal aortic aneurysm, 2.4 cm   Aortic insufficiency    a. mild-mod by intraop TEE 05/2018 (no aortic stenosis) // Echo 2/21: mild //  Echo 9/22: trivial   Aortic stenosis    Echo 9/22: mild (mean 16) // Echo 2/21: mild (mean 18.7)   CAD (coronary artery disease)    a. NSTEMI 05/2018 with multivessel disease -> s/p CABGx4 05/11/18 // Myoview 06/07/20: inf scar w mild peri-infarct ischemia, EF 37; Int Risk   Carotid artery disease ([facility])    PreCABG [place] 05/07/18: Bilat ICA 1-39   CKD (chronic kidney disease), stage III ([place])    HFimpEF (heart failure with improved EF)    Echocardiogram 01/11/20: EF 45, Gr 3 DD, mild AI, mild AS (mean 18.7 mmHg) // Echocardiogram 08/14/21:  EF 55-60, inf HK, mild LVH, Gr 1 DD, normal RVSF,  RVSP 25.3, mild LAE, trivial MR, MAC, trivial AI, mild AS (mean 16 mmHg, Vmax 283 cm/s, DI 0.41)   History of nephrolithiasis 2004   Hyperlipidemia    Hypertension    IBS (irritable colon syndrome)    Ischemic cardiomyopathy    Mitral regurgitation    Echo 9/22: trivial MR   Postoperative atrial fibrillation (HCC)    PVC's (premature ventricular contractions)    Reflux    Type 2 diabetes mellitus ([place])    Past Surgical History:  Procedure Laterality Date   CORONARY ARTERY BYPASS GRAFT N/A 05/11/2018   Procedure: CORONARY ARTERY BYPASS GRAFTING (CABG), on pump, times four, using left internal mammary artery and endoscopically harvested right greater saphenous leg vein.;  Surgeon: Ivin Poot, MD;  Location: [facility];  Service: Open Heart Surgery;  Laterality: N/A;   ESOPHAGOGASTRODUODENOSCOPY  02/04/2012   Procedure: ESOPHAGOGASTRODUODENOSCOPY (EGD);  Surgeon: Rogene Houston, MD;  [facility];  Service: Endoscopy;  Laterality: N/A;  100   LEFT HEART CATH AND CORONARY ANGIOGRAPHY N/A 05/04/2018   Procedure: LEFT HEART CATH AND CORONARY ANGIOGRAPHY;  Surgeon: Belva Crome, MD;  Location: [facility];  Service: Cardiovascular;  Laterality: N/A;   NASAL FRACTURE SURGERY     TEE WITHOUT CARDIOVERSION N/A 05/11/2018   Procedure: TRANSESOPHAGEAL ECHOCARDIOGRAM (TEE);  Surgeon: Prescott Gum, Collier Salina, MD;  Location: [facility];  Service: Open  Heart Surgery;  Laterality: N/A;     SOCIAL HISTORY:  Social History   Socioeconomic History   Marital status: Widowed    Spouse name: Not on file   Number of children: Not on file   Years of education: Not on file   Highest education level: Not on file  Occupational History   Not on file  Tobacco Use   Smoking status: Never   Smokeless tobacco: Never  Vaping Use   Vaping Use: Never used  Substance and Sexual Activity   Alcohol use: No    Alcohol/week: 0.0 standard drinks of alcohol   Drug use: No   Sexual activity: Yes  Other  Topics Concern   Not on file  Social History Narrative   Not on file   Social Determinants of Health   Financial Resource Strain: Not on file  Food Insecurity: Not on file  Transportation Needs: Not on file  Physical Activity: Not on file  Stress: Not on file  Social Connections: Not on file  Intimate Partner Violence: Not on file    FAMILY HISTORY:  Family History  Problem Relation Age of Onset   Diabetes Mellitus II Mother    Heart attack Father    Heart attack Brother    Heart disease Other    Arthritis Other    Asthma Other    Diabetes Other     CURRENT MEDICATIONS:  Outpatient Encounter Medications as of 10/16/2022  Medication Sig   albuterol (VENTOLIN HFA) 108 (90 Base) MCG/ACT inhaler Inhale 2 puffs into the lungs every 6 (six) hours as needed for wheezing or shortness of breath.   apixaban (ELIQUIS) 5 MG TABS tablet Take 1 tablet (5 mg total) by mouth 2 (two) times daily.   atorvastatin (LIPITOR) 40 MG tablet Take 40 mg by mouth daily.   atorvastatin (LIPITOR) 80 MG tablet TAKE 1 TABLET BY MOUTH EVERY DAY AT 6 PM   Cyanocobalamin (B-12 COMPLIANCE INJECTION IJ) Inject 1 Dose as directed every 30 (thirty) days.   dofetilide (TIKOSYN) 500 MCG capsule Take 500 mcg by mouth 2 (two) times daily.   famotidine (PEPCID) 20 MG tablet One after supper (Patient taking differently: Take 20 mg by mouth 2 (two) times daily.)   fluticasone (FLONASE) 50 MCG/ACT nasal spray Place 1 spray into both nostrils daily as needed for allergies or rhinitis.   glipiZIDE (GLUCOTROL XL) 10 MG 24 hr tablet Take 10 mg by mouth daily.   hydrochlorothiazide (HYDRODIURIL) 25 MG tablet Take 25 mg by mouth daily.   loratadine (CLARITIN) 10 MG tablet Take 10 mg by mouth daily.   Melatonin 10 MG CAPS Take 30 mg by mouth at bedtime as needed (sleep).   metFORMIN (GLUCOPHAGE-XR) 750 MG 24 hr tablet Take 750 mg by mouth 2 (two) times daily with a meal.   pantoprazole (PROTONIX) 40 MG tablet Take 40 mg by  mouth daily.   potassium chloride (MICRO-K) 10 MEQ CR capsule Take 10 mEq by mouth daily.   spironolactone (ALDACTONE) 25 MG tablet Take 1 tablet by mouth daily.   valsartan (DIOVAN) 40 MG tablet Take 1 tablet (40 mg total) by mouth daily.   vitamin B-12 (CYANOCOBALAMIN) 500 MCG tablet Take 1 tablet (500 mcg total) by mouth daily.   No facility-administered encounter medications on file as of 10/16/2022.    ALLERGIES:  No Known Allergies   PHYSICAL EXAM:  ECOG Performance status: 1  Vitals:   10/16/22 0903  BP: 131/65  Pulse: 77  Resp: 18  Temp: 97.8 F (36.6 C)  SpO2: 99%   Filed Weights   10/16/22 0903  Weight: 235 lb 3.7 oz (106.7 kg)   Physical Exam Vitals reviewed.  Constitutional:      Appearance: Normal appearance.  Cardiovascular:     Rate and Rhythm: Normal rate and regular rhythm.     Pulses: Normal pulses.     Heart sounds: Normal heart sounds.  Pulmonary:     Effort: Pulmonary effort is normal.     Breath sounds: Normal breath sounds.  Abdominal:     Palpations: Abdomen is soft. There is no mass.  Neurological:     Mental Status: He is alert.  Psychiatric:        Mood and Affect: Mood normal.      LABORATORY DATA:  I have reviewed the labs as listed.  CBC    Component Value Date/Time   WBC 9.6 10/10/2022 0753   RBC 4.59 10/10/2022 0753   HGB 14.1 10/10/2022 0753   HCT 40.4 10/10/2022 0753   PLT 255 10/10/2022 0753   MCV 88.0 10/10/2022 0753   MCH 30.7 10/10/2022 0753   MCHC 34.9 10/10/2022 0753   RDW 14.2 10/10/2022 0753   LYMPHSABS 2.6 06/11/2018 1442   MONOABS 1.3 (H) 06/11/2018 1442   EOSABS 0.6 06/11/2018 1442   BASOSABS 0.0 06/11/2018 1442      Latest Ref Rng & Units 08/23/2021   12:59 PM 07/14/2021    4:32 AM 07/13/2021    8:40 AM  CMP  Glucose 70 - 99 mg/dL  136  150   BUN 8 - 23 mg/dL  12  14   Creatinine 0.61 - 1.24 mg/dL 1.00  0.87  0.94   Sodium 135 - 145 mmol/L  138  135   Potassium 3.5 - 5.1 mmol/L  3.2  3.4    Chloride 98 - 111 mmol/L  104  108   CO2 22 - 32 mmol/L  25  24   Calcium 8.9 - 10.3 mg/dL  8.6  8.5   Total Protein 6.5 - 8.1 g/dL   6.8   Total Bilirubin 0.3 - 1.2 mg/dL   0.9   Alkaline Phos 38 - 126 U/L   74   AST 15 - 41 U/L   23   ALT 0 - 44 U/L   17     DIAGNOSTIC IMAGING:  I have independently reviewed the scans and discussed with the patient.  ASSESSMENT:   1.  Generalized lymphadenopathy: - Reports decrease in energy levels for the past year.  No B symptoms. - Reports hoarseness of voice for the last 1 month with occasional choking to liquids. - CT chest (09/03/2022): Interval enlargement of matted appearing pretracheal and right paratracheal lymph node measuring 2.7 x 2.7 cm, previously 1.9 x 1.6 cm (08/23/2021), mild pulmonary fibrosis. - PET/CT scan (09/11/2022): Mildly metabolically lower cervical lymph nodes bilaterally, numerous on the right with SUV 3.7.  Nodule in the deep lobe of the right parotid gland, 1.4 x 1.0 SUV 10.2.  Asymmetric hypermetabolic activity in the right vocal cord, SUV 8.7 with no focal mass.  Right paratracheal mass 3.7 x 2.8 cm SUV 10.6.  10 mm left paratracheal node with SUV 5.5.  Left para-aortic adenopathy 4.5 x 2.4 cm, SUV 12.4.  Additional smaller retroperitoneal and mesenteric lymph nodes mildly hypermetabolic.  10 mm left inguinal node with SUV 4.3.  Hypermetabolic activity along the medial aspect of the left femoral neck SUV 6.8.  No clear bone lesion on the CT images. - Retroperitoneal lymph node biopsy (10/10/2022): Atypical lymphoid proliferation, but concerning for lymphoproliferative disorder.   2.  Social/family history: - He lives at home by himself and is independent of all ADLs and IADLs. 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  Non-smoker.  Has secondhand smoke exposure from his wife. - No family history of malignancies.     PLAN:  1.  Generalized lymphadenopathy: - We have reviewed pathology report of  the retroperitoneal lymph node biopsy which showed atypical lymphoid proliferation but highly concerning for B-cell lymphoproliferative disorder.  There was not enough tissue to make the diagnosis. - We reviewed labs including LDH, beta-2 microglobulin, hepatitis B and C serology which was normal. - I have recommended bronchoscopy and biopsy of the right paratracheal lymph node mass.  We will make a referral to pulmonary. - He reports dyspnea on exertion.  He will call Jannette Fogo, NP for oxygen testing after exertion to see if he qualifies for home oxygen. - We will make a referral to ENT for examination of the right vocal cord uptake and parotid mass uptake on PET scan. - He reported scrotal swelling that goes away when lying down.  I have examined him today and did not find any swelling.  It is possible that he has inguinal hernia. - RTC after biopsy to discuss further plan.      Orders placed this encounter:  No orders of the defined types were placed in this encounter.     Derek Jack, MD [place] [phone]

## 2022-11-03 ENCOUNTER — Other Ambulatory Visit: Payer: Self-pay | Admitting: Otolaryngology

## 2022-11-03 DIAGNOSIS — J3800 Paralysis of vocal cords and larynx, unspecified: Secondary | ICD-10-CM

## 2022-11-09 NOTE — Progress Notes (Unsigned)
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  The patient works in [employer] and works outside on a daily basis.  Apparently, the patient was noted to have some gait instability and altered mental status on 07/11/2021.  His grandson took him home, and the patient went to take a nap.  Apparently, the patient continued to have some dizziness and gait instability.  He had an episode of diaphoresis when he woke up.  As result, the patient was brought to the hospital for further evaluation.  There was concern initially that the patient had not been hydrating well.  The patient did have 2 episodes of nausea and vomiting on the day of admission.  In the emergency department, the patient had SIRS criteria with a temperature of 94.8 F, heart rate in the 40s, WBC 14.2, and lactate 2.0.  Patient was admitted for further evaluation and treatment.  He was started on intravenous antibiotics and IV fluids.  Since then, his mental status has gradually improved.  His vital signs have stabilized.   Discharge Diagnoses:    SIRS -Present on admission -Sepsis ruled out -Presented with hypothermia and leukocytosis with lactate 2.0 -Physiologically improved -Blood cultures remain negative -UA negative for pyuria -Chest x-ray without any infiltrates -Discontinue antibiotics and observe clinically -Lactic acid 2.0>>1.7 -PCT <0.10 x 3   Acute  metabolic encephalopathy -Mental status has improved with hydration and empiric antibiotics -Oral intake has improved -Discontinue IV fluids and IV antibiotics and observe clinically -Serum B12--61>>>given IM inj and start po supplement -LKJ--1.791   Chronic systolic and diastolic CHF -Clinically euvolemic -01/11/2020 echo EF 45%, grade 3 DD, moderate MR, mild AS -Continue aspirin, metoprolol, Lipitor   Coronary artery disease status post CABG -No chest pain presently -Continue aspirin and Lipitor -Continue metoprolol tartrate   Lactic acidosis -Sepsis ruled out   Abnormal EKG -Patient presented with junctional rhythm and RBBB -Improved after electrolyte abnormalities and metabolic abnormalities corrected -07/12/2021 EKG--personally reviewed sinus rhythm, nonspecific T wave changes -remained on telemetry without concerning dysrhythmia   Essential hypertension -Continue metoprolol tartrate -Restart hydralazine -Continue lisinopril>>increase to 10 mg daily -Holding HCTZ>>resume after d/c   Uncontrolled diabetes mellitus type 2 with hyperglycemia -07/12/2021 hemoglobin A1c 8.3 -Holding metformin and Jardiance >>resume after d/c -Continue NovoLog sliding scale   Anxiety -His benzodiazepines were held in the setting of his altered mental status at the time of admission   History of Present Illness  09/10/2021  Pulmonary/ 1st office eval/Alejandro [name]  Chief Complaint  Patient presents with   Consult    Abnormal chest ct. Fatigue, no SOB. Persistent dry cough.   Dyspnea:  progressively worse x 6 m but limited by dizzy /sob about the same time  Cough: dry  raspy cough  x about the same time Sleep: fine 2 pillows  SABA use: none   Rec Stop lisinopril  and start valsartan 80 mg one daily in it's place and  the voice should improve and the cough should go away over several weeks  Recommend paced walking and keep track of your 02 saturation at peak exertion (not after stopping)  -once a  week is enough to see if trending down (above 90% is the goal)  We will walk you slowly around the office today for a baseline to compare to next visit - Be sure to bring your cane    10/28/2021  f/u ov/[facility] office/Alejandro [name] re: doe? PF / acei case maint on ppi ac qd  still some am cough/ congestion and still unsteady on his feet  Chief Complaint  Patient presents with   Follow-up    Feels some congestion and headaches. SOB and cough have improved since last visit.   Dyspnea:  able to walk to shed ok now limited by balance  Cough: gone off acei / 25%  still congested in am > white  Sleeping: flat bed/ 2 pillows no problem SABA use: 2-3 per week only p exertion, never pre-treats  02: none  Covid status: vax x 3  Rec Add Pepcid   20 mg after supper or bedtime until return  Continue pantoprazole 40 mg Take 30-60 min before first meal of the day  Only use your albuterol as a rescue medication Ok to try albuterol 15 min before an activity (on alternating days)  that you know would usually make you short of breath GERD diet reviewed, bed blocks rec   We will schedule you a head ct due to balance issues, headache and nasal congestion> did not do    Make sure you check your oxygen saturation at your highest level of activity to be sure it stays over 90%   04/29/2022  f/u ov/[facility] office/Alejandro [name] re: uacs ? maint on gerd rx / very confused with instructions/ did not bring cane   Chief Complaint  Patient presents with   Follow-up    Believes he has sinus infection.   Dyspnea:  walking to shed  Cough: resolved Nasal obstruction/ seems better as day goes on but face / head feel like busting out x one month Sleeping: bed is flat / on side, 2 pillows  SABA use: ? Helps / only uses p exertion 02: none  Covid status: vax x 3  Rec Pantoprazole (protonix) 40 mg   Take  30-60 min before first meal of the day and Pepcid (famotidine)  20 mg after supper until return to office My office will be  contacting you by phone for referral to ENT in [place] get a chest and sinus CT here  while you are waiting   Please schedule a follow up office visit in 6 weeks, call sooner if needed with all medications /inhalers/ solutions in hand     11/10/2022  f/u ov/Alejandro [name] re: doe maint on no resp rx/ did not bring meds   No chief complaint on file.  Dyspnea:  walking to shed = 300 ft slt elevation coming  Cough: none / nasal congestion rx flonase  Sleeping: bed is flat with 2 pillows  SABA use: 4 x daily - last used 2 h prior  02: not using at all all      No obvious day to day or daytime variability or assoc excess/ purulent sputum or mucus plugs or hemoptysis or cp or chest tightness, subjective wheeze or overt sinus or hb symptoms.   *** without nocturnal  or early am exacerbation  of respiratory  c/o's or need for noct saba.  Also denies any obvious fluctuation of symptoms with weather or environmental changes or other aggravating or alleviating factors except as outlined above   No unusual exposure hx or h/o childhood pna/ asthma or knowledge of premature birth.  Current Allergies, Complete Past Medical History, Past Surgical History, Family History, and Social History were reviewed in [facility] record.  ROS  The following are not active complaints unless bolded Hoarseness, sore throat, dysphagia, dental problems, itching, sneezing,  nasal congestion or discharge of excess mucus or purulent secretions, ear ache,   fever, chills, sweats, unintended wt loss or wt gain, classically pleuritic or exertional cp,  orthopnea pnd or arm/hand swelling  or leg swelling, presyncope, palpitations, abdominal pain, anorexia, nausea, vomiting, diarrhea  or change in bowel habits or change in bladder habits, change in stools or change in urine, dysuria, hematuria,  rash, arthralgias, visual complaints, headache, numbness, weakness or ataxia or problems with walking or coordination,   change in mood or  memory.        No outpatient medications have been marked as taking for the 11/10/22 encounter (Appointment) with Tanda Rockers, MD.                Past Medical History:  Diagnosis Date   Aortic insufficiency    a. mild-mod by intraop TEE 05/2018 (no aortic stenosis).   CAD (coronary artery disease)    a. NSTEMI 05/2018 with multivessel disease -> s/p CABGx4 05/11/18.   Chronic diastolic CHF (congestive heart failure) ([place])    a. dx 05/2018.   CKD (chronic kidney disease), stage III ([facility])    History of nephrolithiasis 2004   Hyperlipidemia    Hypertension    IBS (irritable colon syndrome)    Postoperative atrial fibrillation (HCC)    PVC's (premature ventricular contractions)    Reflux    Type 2 diabetes mellitus ([place])        Objective:    Wts  11/10/2022     ***  04/29/2022       231   10/28/21 230 lb 1.9 oz (104.4 kg)  09/10/21 230 lb 1.9 oz (104.4 kg)  07/22/21 231 lb (104.8 kg)      Vital signs reviewed  11/10/2022  - Note at rest 02 sats  ***% on ***   General appearance:    ***     insp crackles in bases  bilaterally  L > R base   2-3/6 SEM ***        Assessment

## 2022-11-10 ENCOUNTER — Ambulatory Visit: Payer: Medicare HMO | Admitting: Internal Medicine

## 2022-11-10 ENCOUNTER — Encounter: Payer: Self-pay | Admitting: Internal Medicine

## 2022-11-10 VITALS — BP 140/82 | HR 67 | Temp 98.2°F | Ht 71.0 in | Wt 236.4 lb

## 2022-11-10 DIAGNOSIS — R0609 Other forms of dyspnea: Secondary | ICD-10-CM

## 2022-11-10 DIAGNOSIS — R058 Other specified cough: Secondary | ICD-10-CM

## 2022-11-10 MED ORDER — PREDNISONE 10 MG PO TABS: ORAL_TABLET | ORAL | 0 refills | Status: DC

## 2022-11-10 NOTE — Patient Instructions (Signed)
Prednisone 10 mg take  4 each am x 2 days,   2 each am x 2 days,  1 each am x 2 days and stop   Call if prednisone really helps and we can try symbicort 80    Work on inhaler technique:  relax and gently blow all the way out then take a nice smooth full deep breath back in, triggering the inhaler at same time you start breathing in.  Hold breath in for at least  5 seconds if you can. Rinse and gargle with water when done.  If mouth or throat bother you at all,  try brushing teeth/gums/tongue with arm and hammer toothpaste/ make a slurry and gargle and spit out.   Only use your albuterol as a rescue medication to be used if you can't catch your breath by resting or doing a relaxed purse lip breathing pattern.  - The less you use it, the better it will work when you need it. - Ok to use up to 2 puffs  every 4 hours if you must but call for immediate appointment if use goes up over your usual need - Don't leave home without it !!  (think of it like the spare tire for your car)   Also  Ok to try albuterol 15 min before an activity (on alternating days)  that you know would usually make you short of breath and see if it makes any difference and if makes none then don't take albuterol after activity unless you can't catch your breath as this means it's the resting that helps, not the albuterol.      Please schedule a follow up office visit in 4-6  weeks, sooner if needed [facility] office

## 2022-11-11 ENCOUNTER — Other Ambulatory Visit: Payer: Self-pay | Admitting: Emergency Medicine

## 2022-11-11 ENCOUNTER — Encounter: Payer: Self-pay | Admitting: Emergency Medicine

## 2022-11-11 ENCOUNTER — Encounter: Payer: Self-pay | Admitting: Internal Medicine

## 2022-11-11 ENCOUNTER — Telehealth: Payer: Self-pay | Admitting: Emergency Medicine

## 2022-11-11 DIAGNOSIS — R590 Localized enlarged lymph nodes: Secondary | ICD-10-CM

## 2022-11-11 NOTE — Telephone Encounter (Signed)
Discussed the patient's mediastinal adenopathy, recommendation for endobronchial ultrasound with his niece Dicky Doe.  Explained the rationale for EBUS, risks, benefits.  She understands and agrees and will discuss with Alejandro [name].  He has already discussed biopsy with Dr. Delton Coombes and wants to proceed.  Explained to them that he would need to stop his Eliquis 2 days prior.  We will work on getting this done 11/17/2022.

## 2022-11-11 NOTE — Progress Notes (Signed)
Asked by Dr. Delton Coombes to set up endobronchial ultrasound to evaluate right paratracheal node that is hypermetabolic on PET scan.  Orders placed.

## 2022-11-11 NOTE — Assessment & Plan Note (Signed)
Off acei 09/10/21 >  75% improved 10/28/2021 but still am cough - max gerd rx/ bed blocks rec 10/28/2021  - Sinus CT  04/29/2022 not done as rec  - ENT eval 04/29/2022 >>> done 10/30/22 and c/w L TV paralysis, met ca w/u in progress   Pt now has multiple centers of care and has seen T surgery and oncology since I originally rec ENT eval 6 m ago so will focus just on the pulmonary issue today and encouraged him to follow recs by the specialists he's seen and his PCP  

## 2022-11-11 NOTE — Assessment & Plan Note (Signed)
Onset spring 2012 - Echo 08/14/21  mild AS / Mild LAE/ nl R side  - CT chest  08/23/21 ? Mild early PF - trial off acei 09/10/2021 >>> cough 75% better  - 09/10/2021   Walked on RA x  3  lap(s) =  approx 450 @ slow pace,   with lowest 02 sats 97%  At end but doe on the 2nd lap  - 10/28/2021   Walked on RA x  3  lap(s) =  approx 460f @ moderate  pace, stopped due to end of study with mild sob on last lap and no ataxia noted  with lowest 02 sats 98%   - 04/29/2022   Walked on  RA  x  3  lap(s) =  approx450  ft  @ moderate pace, stopped due to end of study  with lowest 02 sats 90%   - HRCT  09/23/22 Mild pulmonary fibrosis in a pattern with slight apical to basal gradient, featuring irregular peripheral interstitial opacity, septal thickening, some evidence of traction bronchiectasis and minimal subpleural bronchiolectasis in the bilateral lung bases, without clear evidence of honeycombing. Findings are categorized as probable UIP per consensus guidelines: Diagnosis of Idiopathic Pulmonary Fibrosis: - echo 11/19/22 c/w mod AS and Mod LAE - 11/10/2022   Walked on RA  x  1  lap(s) =  approx 250  ft  @  moderately slow pace, stopped due to sob  with lowest 02 sats 96%   His w/u has been delayed with recs from May 2023 not followed and now has likely met ca with worsening AS but no desats to suggest PF is a major limiting factor.    - The proper method of use, as well as anticipated side effects, of a metered-dose inhaler were discussed and demonstrated to the patient using teach back method.    Rec try 6 d days of prednisone and saba to see improves ex tol and if not, then no reason to use it x if can't catch his breath at rest.  If improves can try symb 80 2bid   [place]    Each maintenance medication was reviewed in detail including emphasizing most importantly the difference between maintenance and prns and under what circumstances the prns are to be  triggered using an action plan format where appropriate.  Total time for H and P, chart review, counseling, reviewing hfa device(s) , directly observing portions of ambulatory 02 saturation study/ and generating customized AVS unique to this office visit / same day charting  > 30 min for   refractory respiratory  symptoms of uncertain etiology

## 2022-11-14 NOTE — Progress Notes (Addendum)
Spoke with patient;s niece Dicky Doe. Went over pre-op instructions  and antibacterial soap shower instructions with her. She states she was told to stop Eliquis starting today. She is aware he will have Covid test DOS. No further questions from [place] at this time. Sent secure chat message to Myra Gianotti, PA to review. Faxed request for EKG tracing from PCP OV on 09/03/22 in care everywhere.

## 2022-11-14 NOTE — Anesthesia Preprocedure Evaluation (Signed)
Anesthesia Evaluation  Patient identified by MRN, date of birth, ID band Patient awake    Reviewed: Allergy & Precautions, H&P , NPO status , Patient's Chart, lab work & pertinent test results  Airway Mallampati: III  TM Distance: <3 FB Neck ROM: Full    Dental  (+) Missing, Poor Dentition, Chipped, Dental Advisory Given   Pulmonary neg pulmonary ROS   Pulmonary exam normal breath sounds clear to auscultation       Cardiovascular hypertension, + CAD, + Past MI and + CABG  Normal cardiovascular exam+ Valvular Problems/Murmurs AI  Rhythm:Regular Rate:Normal  01/11/20: EF 45, Gr 3 DD, mild AI, mild AS (mean 18.7 mmHg) // Echocardiogram 08/14/21: EF 55-60, inf HK, mild LVH, Gr 1 DD, normal RVSF, RVSP 25.3, mild LAE, trivial MR, MAC, trivial AI, mild AS (mean 16 mmHg, Vmax 283 cm/s, DI 0.41)   Neuro/Psych negative neurological ROS  negative psych ROS   GI/Hepatic Neg liver ROS,GERD  ,,  Endo/Other  diabetes    Renal/GU Renal InsufficiencyRenal disease  negative genitourinary   Musculoskeletal negative musculoskeletal ROS (+)    Abdominal   Peds negative pediatric ROS (+)  Hematology negative hematology ROS (+)   Anesthesia Other Findings   Reproductive/Obstetrics negative OB ROS                             Anesthesia Physical Anesthesia Plan  ASA: 3  Anesthesia Plan: General   Post-op Pain Management: Minimal or no pain anticipated   Induction: Intravenous  PONV Risk Score and Plan: 2 and Ondansetron, Dexamethasone and Treatment may vary due to age or medical condition  Airway Management Planned: Oral ETT  Additional Equipment:   Intra-op Plan:   Post-operative Plan: Extubation in OR  Informed Consent: I have reviewed the patients History and Physical, chart, labs and discussed the procedure including the risks, benefits and alternatives for the proposed anesthesia with the patient  or authorized representative who has indicated his/her understanding and acceptance.     Dental advisory given  Plan Discussed with: CRNA and Surgeon  Anesthesia Plan Comments: (PAT note written 11/14/2022 by Myra Gianotti, PA-C.  Echo 09/19/22: IMPRESSIONS  1. Left ventricular ejection fraction, by estimation, is 50 to 55%. The  left ventricle has low normal function. The left ventricle has no regional  wall motion abnormalities. The left ventricular internal cavity size was  mildly dilated. There is abnormal  septal motion. Recommend obtaining Limited Echo with global longitudinal  strain.  2. Right ventricular systolic function is low normal. The right  ventricular size is normal.  3. The mitral valve is degenerative. Trivial mitral valve regurgitation.  No evidence of mitral stenosis.  4. The aortic valve is calcified. Aortic valve regurgitation is trivial.  Moderate aortic valve stenosis. Aortic valve mean gradient  measures 21.5 mmHg. Aortic valve peak gradient measures 38.9 mmHg. Aortic  valve area, by VTI measures 1.48  cm.  5. There is moderate dilatation of the aortic root, measuring 44 mm.  6. The inferior vena cava is normal in size with greater than 50%  respiratory variability, suggesting right atrial pressure of 3 mmHg.    Nuclear stress test 08/12/22 ([facility]): IMPRESSION:  1. No reversible ischemia or infarction.  2. Left ventricular wall motion is normal except for mild septal  wall hypokinesia.  3. Left ventricular ejection fraction 53%  4. Non invasive risk stratification*: Low  5. Again noted are prominent mediastinal lymph  nodes that have  minimally changed since 08/23/2001 but limited evaluation. Limited  evaluation of the lungs and difficult to exclude small opacities in  the posterior right lower lobe. This finding could be related to  atelectasis. Small focus of infection cannot be excluded and  recommend clinical correlation.  - 2012  Appropriate Use Criteria for Coronary Revascularization  Focused Update: J Am Coll Cardiol. 8916;94(5):038-882.  http://[email or web address]   )       Anesthesia Quick Evaluation

## 2022-11-14 NOTE — Progress Notes (Signed)
Anesthesia Chart Review: Alejandro [name]  Case: [id number] Date/Time: 11/17/22 1300   Procedure: VIDEO BRONCHOSCOPY WITH ENDOBRONCHIAL ULTRASOUND (Right)   Anesthesia type: General   Pre-op diagnosis: MEDIACTINAL LYMPH ADENOPATHY   Location: [facility] [facility]   Surgeons: Collene Gobble, MD       DISCUSSION: Patient is a 81 year old male scheduled for the above procedure.  He is followed by oncologist Dr. Delton Coombes for generalized lymphadenopathy.  PET scan findings included hypermetabolic paratracheal mass with hypermetabolic adenopathy in the lower neck, mediastinum, retroperitoneum concerning for metastatic disease. He underwent retroperitoneal lymph node biopsy on 10/10/2022 which was nondiagnostic but there is concern for B-cell lymphoproliferative disorder. Dr. Delton Coombes referred him to pulmonology for bronchoscopy for tissue biopsy.   Other history includes never smoker, HTN, HLD, DM2, CAD CABG x4: LIMA-LAD, SVG-DIAG, SVG-OM, SVG-PDA 05/13/18), CHF, PAF ([facility] admission for dofetilide load 08/23/22, s/p DCCV 08/25/22), aortic stenosis (moderate AS 08/2022), ischemic cardiomyopathy, pulmonary fibrosis, CKD, carotid artery disease (mild 1-39/5 BICA 05/07/18). No evidence of AAA by 01/11/20 [place]. LEFT vocal cord paralysis by 10/30/22 ENT evaluation.   He had ENT evaluation on 10/30/22 with Dr. Constance Holster for left parotid mass. He performed flexible fiberoptic laryngoscopy which showed: "The posterior soft palate, uvula, tongue base and vallecula were visualized and appeared healthy without mucosal masses or lesions. The epiglottis, aryepiglottic folds, hypopharynx, supraglottis, glottis were visualized and appeared healthy without mucosal masses or lesions. Vocal fold mobility was normal on the right with paralysis on the left."  He noted that the left vocal cord was paralyzed although the PET scan showed the abnormality on the right and question if the images may be reversed.  The  patient was not having any aspiration and his voice was pretty strong with decent glottic closure.  He plans to discuss next steps with Alejandro [name] oncologist but will likely have him get a CT of the neck to further evaluate the parotid mass in the course of the recurrent laryngeal nerve.   It sounds like earlier this year he may have been frustrated about management of PAF with [facility] and was most recently seen at [facility] cardiology and admitted for dofetilide load 08/23/22, s/p DCCV 08/25/22. He had a non-ischemic stress test at [facility] on 08/12/22. 09/19/22 echo (ordered by oncology) showed LVEF 50-55%, no RWMA, abnormal septal motion, recommend obtaining limited echo with global longitudinal strain, low normal RVSF, trivial MR, moderate AS, 44 mm aortic root. Last telephone encounter indicates that Alejandro [name] and his neice would need to decide where he wants to continue on-going cardiology care. Reportedly he is going to stick with Dr. [name] and Dr. Pietro Cassis input given during PAT phone interview--although last follow-up was with [facility] (see Care Everywhere).  Dr. Lamonte Sakai advised holding Eliquis for 2 days prior to procedure. 09/03/22 EKG requested from PCP Pablo Lawrence, NP.  He is a same day work-up.  Tissue diagnosis is needed to guide management.  He does have a significant cardiac history although with recent echocardiogram in October and nuclear stress test in September.  He does have moderate aortic stenosis.  He underwent cardioversion at [facility] on 08/25/2022.  Reportedly he wants to resume cardiac care through [facility].  Reviewed available information with anesthesiologist Oren Bracket, MD.  Anesthesia team to evaluate on the day of surgery.   VS:  BP Readings from Last 3 Encounters:  11/10/22 (!) 140/82  10/16/22 131/65  10/10/22 122/67   Pulse Readings from Last 3 Encounters:  11/10/22 67  10/16/22 77  10/10/22 79    OTHER: - 11/10/2022    Walked on RA  x  1  lap(s) =  approx 250  ft  @  moderately slow pace, stopped due to sob  with lowest 02 sats 96%     PROVIDERS: Pablo Lawrence, NP is PCP  Christinia Gully, MD is pulmonologist Derek Jack, MD is HEM-[facility] Modesto Charon, MD is surgeon (mediastinal adenopathy) Izora Gala, MD is ENT He has seen several cardiologist within the past 1-2 years including Hildred Alamin, [place] Cristopher Peru, MD and Flo Shanks, MD ([facility]) and EP Palamaner, Mel Almond, MD ([facility]).   LABS: On day of surgery as indicated. Last results in [facility] include: Lab Results  Component Value Date   WBC 9.6 10/10/2022   HGB 14.1 10/10/2022   HCT 40.4 10/10/2022   PLT 255 10/10/2022   GLUCOSE 136 (H) 07/14/2021   ALT 17 07/13/2021   AST 23 07/13/2021   NA 138 07/14/2021   K 3.2 (L) 07/14/2021   CL 104 07/14/2021   CREATININE 1.00 08/23/2021   BUN 12 07/14/2021   CO2 25 07/14/2021   TSH 1.399 07/13/2021   INR 1.0 10/10/2022   HGBA1C 8.3 (H) 07/12/2021      IMAGES: PET Scan 09/11/22: IMPRESSION: 1. The enlarging right paratracheal mass seen on recent CT is hypermetabolic, consistent with malignancy. Assuming confirmed non-small cell lung cancer, this would be most consistent with metastatic disease. Additional hypermetabolic adenopathy in the lower neck, mediastinum and retroperitoneum, most consistent with metastatic disease. If lung cancer has not been confirmed, consider an alternative diagnosis such as lymphoma. No focal lung lesion identified. 2. Asymmetric hypermetabolic activity associated with the right vocal cord without focal mass lesion identified. This is more than typically seen from contralateral vocal cord paralysis. Correlate clinically and consider direct visualization. 3. Focal hypermetabolic activity within the deep lobe of the right parotid gland, potentially a metastatic lesion or incidental primary parotid neoplasm. 4. Focal hypermetabolic  activity along the medial aspect of the left femoral neck. Without other osseous lesions or clear corresponding CT finding, this could be stress mediated. Small metastasis not excluded. 5.  Aortic Atherosclerosis (ICD10-I70.0).  CT Chest 09/03/22: IMPRESSION: 1. Interval enlargement of matted appearing pretracheal and right paratracheal lymph nodes, measuring up to 2.7 x 2.7 cm and highly concerning for metastasis. No primary lesion clearly identified in the chest. Consider multidisciplinary thoracic oncology referral and PET-CT and/or tissue sampling. 2. Mild pulmonary fibrosis in a pattern with slight apical to basal gradient, featuring irregular peripheral interstitial opacity, septal thickening, some evidence of traction bronchiectasis and minimal subpleural bronchiolectasis in the bilateral lung bases, without clear evidence of honeycombing. Findings are categorized as probable UIP per consensus guidelines: Diagnosis of Idiopathic Pulmonary Fibrosis: An Official ATS/ERS/JRS/ALAT Clinical Practice Guideline. [place], Iss 5, [phone], Aug 01 2017. 3. Cardiomegaly and coronary artery disease. 4. Aortic valve calcifications. Correlate for echocardiographic evidence of aortic valve dysfunction. 5. Somewhat coarse contour of the liver in the included upper abdomen, suggestive of cirrhosis. Correlate with biochemical findings.    EKG: 12/20/21: Marked sinus bradycardia at 41 bpm.  First-degree AV block.   CV: Echo 09/19/22: IMPRESSIONS   1. Left ventricular ejection fraction, by estimation, is 50 to 55%. The  left ventricle has low normal function. The left ventricle has no regional  wall motion abnormalities. The left ventricular internal cavity size was  mildly dilated. There is abnormal  septal motion. Recommend obtaining Limited Echo with global longitudinal  strain.   2. Right ventricular systolic function is low normal. The right  ventricular  size is normal.   3. The mitral valve is degenerative. Trivial mitral valve regurgitation.  No evidence of mitral stenosis.   4. The aortic valve is calcified. Aortic valve regurgitation is trivial.  Moderate aortic valve stenosis. Aortic valve mean gradient  measures 21.5 mmHg. Aortic valve peak gradient measures 38.9 mmHg. Aortic  valve area, by VTI measures 1.48   cm.   5. There is moderate dilatation of the aortic root, measuring 44 mm.   6. The inferior vena cava is normal in size with greater than 50%  respiratory variability, suggesting right atrial pressure of 3 mmHg.    Nuclear stress test 08/12/22 ([facility]): IMPRESSION:  1. No reversible ischemia or infarction.  2. Left ventricular wall motion is normal except for mild septal  wall hypokinesia.  3. Left ventricular ejection fraction 53%  4. Non invasive risk stratification*: Low  5. Again noted are prominent mediastinal lymph nodes that have  minimally changed since 08/23/2001 but limited evaluation. Limited  evaluation of the lungs and difficult to exclude small opacities in  the posterior right lower lobe. This finding could be related to  atelectasis. Small focus of infection cannot be excluded and  recommend clinical correlation.  - 2012 Appropriate Use Criteria for Coronary Revascularization  Focused Update: J Am Coll Cardiol. 2703;50(0):938-182.  [email or web address]    Long term monitor 02/06/22: - ZIO XT reviewed.  10 days, 13 hours analyzed.  Predominant rhythm is sinus with prolonged PR interval.  Heart rate ranged from 48 bpm up to 114 bpm with average heart rate 72 bpm.   - There were frequent PACs including couplets and triplets representing approximately 17% total beats.  There were otherwise rare PVCs including couplets and triplets representing less than 1% total beats.  Limited ventricular bigeminy and trigeminy noted.   - There were multiple episodes of brief  PSVT, the longest of which lasted for 13 beats.  Paroxysmal atrial fibrillation was also noted although with only 1% total rhythm burden.  The longest episode of atrial fibrillation was 3 hours and 43 minutes on February 8 with average heart rate in the 90s.   - There were no significant pauses.   US Carotid 05/07/18: Final Interpretation:  Right Carotid: Velocities in the right ICA are consistent with a 1-39%  stenosis.  Left Carotid: Velocities in the left ICA are consistent with a 1-39%  stenosis.    Last LHC was pre-CABG (05/04/18).   Past Medical History:  Diagnosis Date   AAA Ultrasound 01/11/2020    AAA [place] 01/11/20:  no abdominal aortic aneurysm, 2.4 cm   Aortic insufficiency    a. mild-mod by intraop TEE 05/2018 (no aortic stenosis) // Echo 2/21: mild //  Echo 9/22: trivial   Aortic stenosis    Echo 9/22: mild (mean 16) // Echo 2/21: mild (mean 18.7)   CAD (coronary artery disease)    a. NSTEMI 05/2018 with multivessel disease -> s/p CABGx4 05/11/18 // Myoview 06/07/20: inf scar w mild peri-infarct ischemia, EF 37; Int Risk   Carotid artery disease ([place])    PreCABG [place] 05/07/18: Bilat ICA 1-39   CKD (chronic kidney disease), stage III ([place])    HFimpEF (heart failure with improved EF)    Echocardiogram 01/11/20: EF 45, Gr 3 DD, mild AI, mild AS (mean 18.7 mmHg) // Echocardiogram 08/14/21:  EF  55-60, inf HK, mild LVH, Gr 1 DD, normal RVSF, RVSP 25.3, mild LAE, trivial MR, MAC, trivial AI, mild AS (mean 16 mmHg, Vmax 283 cm/s, DI 0.41)   History of nephrolithiasis 2004   Hyperlipidemia    Hypertension    IBS (irritable colon syndrome)    Ischemic cardiomyopathy    Mitral regurgitation    Echo 9/22: trivial MR   Postoperative atrial fibrillation ([facility])    PVC's (premature ventricular contractions)    Reflux    Type 2 diabetes mellitus ([place])     Past Surgical History:  Procedure Laterality Date   CORONARY ARTERY BYPASS GRAFT N/A 05/11/2018   Procedure: CORONARY ARTERY BYPASS GRAFTING  (CABG), on pump, times four, using left internal mammary artery and endoscopically harvested right greater saphenous leg vein.;  Surgeon: Ivin Poot, MD;  Location: [facility];  Service: Open Heart Surgery;  Laterality: N/A;   ESOPHAGOGASTRODUODENOSCOPY  02/04/2012   Procedure: ESOPHAGOGASTRODUODENOSCOPY (EGD);  Surgeon: Rogene Houston, MD;  [facility];  Service: Endoscopy;  Laterality: N/A;  100   LEFT HEART CATH AND CORONARY ANGIOGRAPHY N/A 05/04/2018   Procedure: LEFT HEART CATH AND CORONARY ANGIOGRAPHY;  Surgeon: Belva Crome, MD;  Location: [facility];  Service: Cardiovascular;  Laterality: N/A;   NASAL FRACTURE SURGERY     TEE WITHOUT CARDIOVERSION N/A 05/11/2018   Procedure: TRANSESOPHAGEAL ECHOCARDIOGRAM (TEE);  Surgeon: Prescott Gum, Collier Salina, MD;  Location: [place];  Service: Open Heart Surgery;  Laterality: N/A;    MEDICATIONS: No current facility-administered medications for this encounter.    albuterol (VENTOLIN HFA) 108 (90 Base) MCG/ACT inhaler   atorvastatin (LIPITOR) 40 MG tablet   cyanocobalamin (VITAMIN B12) 1000 MCG tablet   cyanocobalamin (VITAMIN B12) 1000 MCG/ML injection   dofetilide (TIKOSYN) 500 MCG capsule   famotidine (PEPCID) 20 MG tablet   fluticasone (FLONASE) 50 MCG/ACT nasal spray   glipiZIDE (GLUCOTROL XL) 10 MG 24 hr tablet   hydrochlorothiazide (HYDRODIURIL) 25 MG tablet   ibuprofen (ADVIL) 200 MG tablet   loratadine (CLARITIN) 10 MG tablet   Melatonin 10 MG CAPS   metFORMIN (GLUCOPHAGE-XR) 750 MG 24 hr tablet   pantoprazole (PROTONIX) 40 MG tablet   potassium chloride (MICRO-K) 10 MEQ CR capsule   Povidone, PF, (IVIZIA DRY EYES) 0.5 % SOLN   predniSONE (DELTASONE) 10 MG tablet   spironolactone (ALDACTONE) 25 MG tablet   valsartan (DIOVAN) 40 MG tablet   apixaban (ELIQUIS) 5 MG TABS tablet    Myra Gianotti, PA-C Surgical Short Stay/Anesthesiology [facility] Phone [phone] [facility] Phone [phone] 11/14/2022 4:36  PM

## 2022-11-17 ENCOUNTER — Encounter (HOSPITAL_COMMUNITY): Payer: Self-pay | Admitting: Emergency Medicine

## 2022-11-17 ENCOUNTER — Ambulatory Visit (HOSPITAL_COMMUNITY)
Admission: RE | Admit: 2022-11-17 | Discharge: 2022-11-17 | Disposition: A | Discharge status: Home / Self Care | Payer: Medicare HMO | Source: Ambulatory Visit | Attending: Emergency Medicine | Admitting: Emergency Medicine

## 2022-11-17 ENCOUNTER — Other Ambulatory Visit: Payer: Self-pay

## 2022-11-17 ENCOUNTER — Ambulatory Visit (HOSPITAL_COMMUNITY): Payer: Medicare HMO | Admitting: Vascular Surgery

## 2022-11-17 ENCOUNTER — Encounter (HOSPITAL_COMMUNITY)
Admission: RE | Disposition: A | Discharge status: Home / Self Care | Payer: Self-pay | Source: Ambulatory Visit | Attending: Emergency Medicine

## 2022-11-17 ENCOUNTER — Ambulatory Visit (HOSPITAL_BASED_OUTPATIENT_CLINIC_OR_DEPARTMENT_OTHER): Payer: Medicare HMO | Admitting: Vascular Surgery

## 2022-11-17 DIAGNOSIS — Z1152 Encounter for screening for COVID-19: Secondary | ICD-10-CM | POA: Diagnosis not present

## 2022-11-17 DIAGNOSIS — R590 Localized enlarged lymph nodes: Secondary | ICD-10-CM | POA: Diagnosis present

## 2022-11-17 DIAGNOSIS — Z951 Presence of aortocoronary bypass graft: Secondary | ICD-10-CM | POA: Insufficient documentation

## 2022-11-17 DIAGNOSIS — I2510 Atherosclerotic heart disease of native coronary artery without angina pectoris: Secondary | ICD-10-CM | POA: Insufficient documentation

## 2022-11-17 DIAGNOSIS — I252 Old myocardial infarction: Secondary | ICD-10-CM

## 2022-11-17 DIAGNOSIS — I480 Paroxysmal atrial fibrillation: Secondary | ICD-10-CM | POA: Insufficient documentation

## 2022-11-17 DIAGNOSIS — I5042 Chronic combined systolic (congestive) and diastolic (congestive) heart failure: Secondary | ICD-10-CM | POA: Insufficient documentation

## 2022-11-17 DIAGNOSIS — N183 Chronic kidney disease, stage 3 unspecified: Secondary | ICD-10-CM | POA: Insufficient documentation

## 2022-11-17 DIAGNOSIS — I130 Hypertensive heart and chronic kidney disease with heart failure and stage 1 through stage 4 chronic kidney disease, or unspecified chronic kidney disease: Secondary | ICD-10-CM | POA: Diagnosis not present

## 2022-11-17 DIAGNOSIS — I10 Essential (primary) hypertension: Secondary | ICD-10-CM | POA: Diagnosis not present

## 2022-11-17 DIAGNOSIS — E1122 Type 2 diabetes mellitus with diabetic chronic kidney disease: Secondary | ICD-10-CM | POA: Insufficient documentation

## 2022-11-17 HISTORY — PX: VIDEO BRONCHOSCOPY WITH ENDOBRONCHIAL ULTRASOUND: SHX6177

## 2022-11-17 HISTORY — PX: BRONCHIAL NEEDLE ASPIRATION BIOPSY: SHX5106

## 2022-11-17 LAB — CBC
HCT: 40.7 % (ref 39.0–52.0)
Hemoglobin: 13.6 g/dL (ref 13.0–17.0)
MCH: 30 pg (ref 26.0–34.0)
MCHC: 33.4 g/dL (ref 30.0–36.0)
MCV: 89.8 fL (ref 80.0–100.0)
Platelets: 243 10*3/uL (ref 150–400)
RBC: 4.53 MIL/uL (ref 4.22–5.81)
RDW: 13.7 % (ref 11.5–15.5)
WBC: 9.8 10*3/uL (ref 4.0–10.5)
nRBC: 0 % (ref 0.0–0.2)

## 2022-11-17 LAB — BASIC METABOLIC PANEL
Anion gap: 11 (ref 5–15)
BUN: 18 mg/dL (ref 8–23)
CO2: 23 mmol/L (ref 22–32)
Calcium: 8.8 mg/dL — ABNORMAL LOW (ref 8.9–10.3)
Chloride: 102 mmol/L (ref 98–111)
Creatinine, Ser: 1.22 mg/dL (ref 0.61–1.24)
GFR, Estimated: >60 mL/min (ref >60)
Glucose, Bld: 225 mg/dL — ABNORMAL HIGH (ref 70–99)
Potassium: 3.9 mmol/L (ref 3.5–5.1)
Sodium: 136 mmol/L (ref 135–145)

## 2022-11-17 LAB — GLUCOSE, CAPILLARY
Glucose-Capillary: 213 mg/dL — ABNORMAL HIGH (ref 70–99)
Glucose-Capillary: 190 mg/dL — ABNORMAL HIGH (ref 70–99)
Glucose-Capillary: 185 mg/dL — ABNORMAL HIGH (ref 70–99)

## 2022-11-17 LAB — SARS CORONAVIRUS 2 BY RT PCR: SARS Coronavirus 2 by RT PCR: NEGATIVE

## 2022-11-17 SURGERY — BRONCHOSCOPY, WITH EBUS
Anesthesia: General | Laterality: Right

## 2022-11-17 MED ORDER — FAMOTIDINE 20 MG PO TABS: 20.0000 mg | ORAL_TABLET | Freq: Two times a day (BID) | ORAL | Status: DC

## 2022-11-17 MED ORDER — CHLORHEXIDINE GLUCONATE 0.12 % MT SOLN
OROMUCOSAL | Status: AC
  Administered 2022-11-17: 15 mL via OROMUCOSAL
  Filled 2022-11-17: qty 15

## 2022-11-17 MED ORDER — APIXABAN 5 MG PO TABS: 5.0000 mg | ORAL_TABLET | Freq: Two times a day (BID) | ORAL | 11 refills | Status: DC

## 2022-11-17 MED ORDER — ROCURONIUM BROMIDE 10 MG/ML (PF) SYRINGE
PREFILLED_SYRINGE | INTRAVENOUS | Status: DC | PRN
  Administered 2022-11-17: 70 mg via INTRAVENOUS

## 2022-11-17 MED ORDER — INSULIN ASPART 100 UNIT/ML IJ SOLN
INTRAMUSCULAR | Status: AC
  Administered 2022-11-17: 2 [IU] via SUBCUTANEOUS
  Filled 2022-11-17: qty 1

## 2022-11-17 MED ORDER — ONDANSETRON HCL 4 MG/2ML IJ SOLN
INTRAMUSCULAR | Status: DC | PRN
  Administered 2022-11-17: 4 mg via INTRAVENOUS

## 2022-11-17 MED ORDER — LIDOCAINE 2% (20 MG/ML) 5 ML SYRINGE
INTRAMUSCULAR | Status: DC | PRN
  Administered 2022-11-17: 60 mg via INTRAVENOUS

## 2022-11-17 MED ORDER — DEXAMETHASONE SODIUM PHOSPHATE 10 MG/ML IJ SOLN
INTRAMUSCULAR | Status: DC | PRN
  Administered 2022-11-17: 5 mg via INTRAVENOUS

## 2022-11-17 MED ORDER — FENTANYL CITRATE (PF) 100 MCG/2ML IJ SOLN
INTRAMUSCULAR | Status: DC | PRN
  Administered 2022-11-17: 100 ug via INTRAVENOUS

## 2022-11-17 MED ORDER — INSULIN ASPART 100 UNIT/ML IJ SOLN: 0.0000 [IU] | INTRAMUSCULAR | Status: DC | PRN

## 2022-11-17 MED ORDER — PROPOFOL 10 MG/ML IV BOLUS
INTRAVENOUS | Status: DC | PRN
  Administered 2022-11-17: 120 mg via INTRAVENOUS
  Administered 2022-11-17: 80 mg via INTRAVENOUS

## 2022-11-17 MED ORDER — DOFETILIDE 500 MCG PO CAPS
500.0000 ug | ORAL_CAPSULE | ORAL | Status: AC
  Administered 2022-11-17: 500 ug via ORAL
  Filled 2022-11-17: qty 1

## 2022-11-17 MED ORDER — SUGAMMADEX SODIUM 200 MG/2ML IV SOLN
INTRAVENOUS | Status: DC | PRN
  Administered 2022-11-17: 200 mg via INTRAVENOUS

## 2022-11-17 MED ORDER — CHLORHEXIDINE GLUCONATE 0.12 % MT SOLN: 15.0000 mL | Freq: Once | OROMUCOSAL | Status: AC

## 2022-11-17 MED ORDER — LACTATED RINGERS IV SOLN: INTRAVENOUS | Status: DC

## 2022-11-17 NOTE — Progress Notes (Addendum)
Patient did not take any medications at home this morning. Pt states he has been taking his Tikosyn, but did not take this morning. Dr Kalman Shan notified and order for home dose given. Dr Kalman Shan notified that Potassium 3.9 this morning. Per MD, no need for new EKG or labs prior to giving home dose of Tikosyn.

## 2022-11-17 NOTE — H&P (Signed)
Alejandro [name] is an 81 y.o. male.   Chief Complaint: Generalized adenopathy, mediastinal adenopathy HPI: 81 year old never smoker with a history of CAD/CABG, hypertension, atrial fibrillation, systolic and diastolic CHF, diabetes.  Has been seen in our office by Dr. Melvyn Novas for dyspnea.  Under evaluation for generalized lymphadenopathy with retroperitoneal node biopsy suspicious for B-cell lymphoproliferative disorder.  That biopsy was not fully diagnostic.  He presents today for endobronchial ultrasound to approach right paratracheal node for more tissue.  Plan is for endobronchial ultrasound with needle biopsies.  He understands the rationale, risks, benefits and elects to proceed.  Past Medical History:  Diagnosis Date   AAA Ultrasound 01/11/2020    AAA [place] 01/11/20:  no abdominal aortic aneurysm, 2.4 cm   Aortic insufficiency    a. mild-mod by intraop TEE 05/2018 (no aortic stenosis) // Echo 2/21: mild //  Echo 9/22: trivial   Aortic stenosis    Echo 9/22: mild (mean 16) // Echo 2/21: mild (mean 18.7)   CAD (coronary artery disease)    a. NSTEMI 05/2018 with multivessel disease -> s/p CABGx4 05/11/18 // Myoview 06/07/20: inf scar w mild peri-infarct ischemia, EF 37; Int Risk   Carotid artery disease ([place])    PreCABG [place] 05/07/18: Bilat ICA 1-39   CKD (chronic kidney disease), stage III ([place])    HFimpEF (heart failure with improved EF)    Echocardiogram 01/11/20: EF 45, Gr 3 DD, mild AI, mild AS (mean 18.7 mmHg) // Echocardiogram 08/14/21:  EF 55-60, inf HK, mild LVH, Gr 1 DD, normal RVSF, RVSP 25.3, mild LAE, trivial MR, MAC, trivial AI, mild AS (mean 16 mmHg, Vmax 283 cm/s, DI 0.41)   History of nephrolithiasis 2004   Hyperlipidemia    Hypertension    IBS (irritable colon syndrome)    Ischemic cardiomyopathy    Mitral regurgitation    Echo 9/22: trivial MR   Postoperative atrial fibrillation ([facility])    PVC's (premature ventricular contractions)    Reflux    Type 2 diabetes mellitus ([place])     Past  Surgical History:  Procedure Laterality Date   CORONARY ARTERY BYPASS GRAFT N/A 05/11/2018   Procedure: CORONARY ARTERY BYPASS GRAFTING (CABG), on pump, times four, using left internal mammary artery and endoscopically harvested right greater saphenous leg vein.;  Surgeon: Ivin Poot, MD;  Location: [facility];  Service: Open Heart Surgery;  Laterality: N/A;   ESOPHAGOGASTRODUODENOSCOPY  02/04/2012   Procedure: ESOPHAGOGASTRODUODENOSCOPY (EGD);  Surgeon: Rogene Houston, MD;  [facility];  Service: Endoscopy;  Laterality: N/A;  100   LEFT HEART CATH AND CORONARY ANGIOGRAPHY N/A 05/04/2018   Procedure: LEFT HEART CATH AND CORONARY ANGIOGRAPHY;  Surgeon: Belva Crome, MD;  Location: [facility];  Service: Cardiovascular;  Laterality: N/A;   NASAL FRACTURE SURGERY     TEE WITHOUT CARDIOVERSION N/A 05/11/2018   Procedure: TRANSESOPHAGEAL ECHOCARDIOGRAM (TEE);  Surgeon: Prescott Gum, Collier Salina, MD;  Location: [facility];  Service: Open Heart Surgery;  Laterality: N/A;    Family History  Problem Relation Age of Onset   Diabetes Mellitus II Mother    Heart attack Father    Heart attack Brother    Heart disease Other    Arthritis Other    Asthma Other    Diabetes Other    Social History:  reports that he has never smoked. He has never used smokeless tobacco. He reports that he does not drink alcohol and does not use drugs.  Allergies: No Known  Allergies  Medications Prior to Admission  Medication Sig Dispense Refill   albuterol (VENTOLIN HFA) 108 (90 Base) MCG/ACT inhaler Inhale 2 puffs into the lungs every 6 (six) hours as needed for wheezing or shortness of breath.     atorvastatin (LIPITOR) 40 MG tablet Take 40 mg by mouth at bedtime.     cyanocobalamin (VITAMIN B12) 1000 MCG tablet Take 1,000 mcg by mouth daily.     cyanocobalamin (VITAMIN B12) 1000 MCG/ML injection Inject 1,000 mcg into the skin every 30 (thirty) days.     dofetilide (TIKOSYN) 500 MCG capsule Take 500 mcg by mouth 2  (two) times daily.     famotidine (PEPCID) 20 MG tablet One after supper (Patient taking differently: Take 20 mg by mouth 2 (two) times daily.) 30 tablet 11   fluticasone (FLONASE) 50 MCG/ACT nasal spray Place 1 spray into both nostrils daily.     glipiZIDE (GLUCOTROL XL) 10 MG 24 hr tablet Take 10 mg by mouth daily.     hydrochlorothiazide (HYDRODIURIL) 25 MG tablet Take 25 mg by mouth daily.     ibuprofen (ADVIL) 200 MG tablet Take 400-600 mg by mouth every 6 (six) hours as needed for moderate pain.     loratadine (CLARITIN) 10 MG tablet Take 10 mg by mouth daily.     Melatonin 10 MG CAPS Take 30 mg by mouth at bedtime as needed (sleep).     metFORMIN (GLUCOPHAGE-XR) 750 MG 24 hr tablet Take 750 mg by mouth 2 (two) times daily with a meal.     pantoprazole (PROTONIX) 40 MG tablet Take 40 mg by mouth daily.     potassium chloride (MICRO-K) 10 MEQ CR capsule Take 10 mEq by mouth daily.     Povidone, PF, (IVIZIA DRY EYES) 0.5 % SOLN Place 1 drop into both eyes daily as needed (dry eyes).     predniSONE (DELTASONE) 10 MG tablet Take  4 each am x 2 days,   2 each am x 2 days,  1 each am x 2 days and stop 14 tablet 0   spironolactone (ALDACTONE) 25 MG tablet Take 1 tablet by mouth daily.     valsartan (DIOVAN) 40 MG tablet Take 1 tablet (40 mg total) by mouth daily. 30 tablet 11   apixaban (ELIQUIS) 5 MG TABS tablet Take 1 tablet (5 mg total) by mouth 2 (two) times daily. 60 tablet 11    Results for orders placed or performed during the hospital encounter of 11/17/22 (from the past 48 hour(s))  SARS Coronavirus 2 by RT PCR (hospital order, performed in [facility] hospital lab) *cepheid single result test* Anterior Nasal Swab     Status: None   Collection Time: 11/17/22  9:53 AM   Specimen: Anterior Nasal Swab  Result Value Ref Range   SARS Coronavirus 2 by RT PCR NEGATIVE NEGATIVE    Comment: (NOTE) SARS-CoV-2 target nucleic acids are NOT DETECTED.  The SARS-CoV-2 RNA is generally detectable  in upper and lower respiratory specimens during the acute phase of infection. The lowest concentration of SARS-CoV-2 viral copies this assay can detect is 250 copies / mL. A negative result does not preclude SARS-CoV-2 infection and should not be used as the sole basis for treatment or other patient management decisions.  A negative result may occur with improper specimen collection / handling, submission of specimen other than nasopharyngeal swab, presence of viral mutation(s) within the areas targeted by this assay, and inadequate number of viral copies (<250 copies /  mL). A negative result must be combined with clinical observations, patient history, and epidemiological information.  Fact Sheet for Patients:   https://www.patel.info/  Fact Sheet for Healthcare Providers: https://hall.com/  This test is not yet approved or  cleared by the [place] FDA and has been authorized for detection and/or diagnosis of SARS-CoV-2 by FDA under an Emergency Use Authorization (EUA).  This EUA will remain in effect (meaning this test can be used) for the duration of the COVID-19 declaration under Section 564(b)(1) of the Act, 21 U.S.C. section 360bbb-3(b)(1), unless the authorization is terminated or revoked sooner.  Performed at [facility], [place] [address], [place], [place] [address]   Glucose, capillary     Status: Abnormal   Collection Time: 11/17/22 10:05 AM  Result Value Ref Range   Glucose-Capillary 213 (H) 70 - 99 mg/dL    Comment: Glucose reference range applies only to samples taken after fasting for at least 8 hours.   Comment 1 Notify RN    Comment 2 Document in Chart   CBC per protocol     Status: None   Collection Time: 11/17/22 10:29 AM  Result Value Ref Range   WBC 9.8 4.0 - 10.5 K/uL   RBC 4.53 4.22 - 5.81 MIL/uL   Hemoglobin 13.6 13.0 - 17.0 g/dL   HCT 40.7 39.0 - 52.0 %   MCV 89.8 80.0 - 100.0 fL   MCH 30.0 26.0 -  34.0 pg   MCHC 33.4 30.0 - 36.0 g/dL   RDW 13.7 11.5 - 15.5 %   Platelets 243 150 - 400 K/uL   nRBC 0.0 0.0 - 0.2 %    Comment: Performed at [facility], [place] [address], [place], [place] [address]  Basic metabolic panel per protocol     Status: Abnormal   Collection Time: 11/17/22 10:29 AM  Result Value Ref Range   Sodium 136 135 - 145 mmol/L   Potassium 3.9 3.5 - 5.1 mmol/L   Chloride 102 98 - 111 mmol/L   CO2 23 22 - 32 mmol/L   Glucose, Bld 225 (H) 70 - 99 mg/dL    Comment: Glucose reference range applies only to samples taken after fasting for at least 8 hours.   BUN 18 8 - 23 mg/dL   Creatinine, Ser 1.22 0.61 - 1.24 mg/dL   Calcium 8.8 (L) 8.9 - 10.3 mg/dL   GFR, Estimated >60 >60 mL/min    Comment: (NOTE) Calculated using the CKD-EPI Creatinine Equation (2021)    Anion gap 11 5 - 15    Comment: Performed at [facility] [address] [place], [place] [address]   No results found.  Review of Systems  Blood pressure (!) 152/71, pulse 72, temperature 98 F (36.7 C), temperature source Oral, resp. rate 18, height _0  (1.803 m), weight 106.6 kg, SpO2 99 %. Physical Exam  Gen: Pleasant, obese man, in no distress,  normal affect  ENT: No lesions,  poor dentition, mouth clear,  oropharynx clear, no postnasal drip  Neck: No JVD, no stridor  Lungs: No use of accessory muscles, no crackles or wheezing on normal respiration, no wheeze on forced expiration  Cardiovascular: RRR, heart sounds normal, no murmur or gallops, no peripheral edema  Abdomen: soft and NT, somewhat distended, no HSM,  BS normal  Musculoskeletal: No deformities, no cyanosis or clubbing  Neuro: alert, awake, non focal  Skin: Warm, no lesions or rashes    Assessment/Plan Widespread lymphadenopathy with  hypermetabolic right paratracheal node at station 4R.  His retroperitoneal nodal biopsy was not fully diagnostic and he is here today for more tissue.  The 4R node is a good target.   We will perform EBUS under general anesthesia.  He understands the risk, benefits and rationale.  Elects to proceed.  We will perform under general anesthesia.  No barriers identified.  Collene [facility], MD 11/17/2022, 11:58 AM

## 2022-11-17 NOTE — Anesthesia Postprocedure Evaluation (Signed)
Anesthesia Post Note  Patient: Alejandro [name]  Procedure(s) Performed: VIDEO BRONCHOSCOPY WITH ENDOBRONCHIAL ULTRASOUND (Right) BRONCHIAL NEEDLE ASPIRATION BIOPSIES     Patient location during evaluation: PACU Anesthesia Type: General Level of consciousness: awake and alert Pain management: pain level controlled Vital Signs Assessment: post-procedure vital signs reviewed and stable Respiratory status: spontaneous breathing, nonlabored ventilation, respiratory function stable and patient connected to nasal cannula oxygen Cardiovascular status: blood pressure returned to baseline and stable Postop Assessment: no apparent nausea or vomiting Anesthetic complications: yes  Encounter Notable Events  Notable Event Outcome Phase Comment  Difficult to intubate - expected  Intraprocedure Filed from anesthesia note documentation.    Last Vitals:  Vitals:   11/17/22 1347 11/17/22 1402  BP: (!) 149/78 (!) 147/82  Pulse: 65 63  Resp: 19 13  Temp:  36.7 C  SpO2: 92% 93%    Last Pain:  Vitals:   11/17/22 1332  TempSrc:   PainSc: 0-No pain                 Fatina Sprankle S

## 2022-11-17 NOTE — Anesthesia Procedure Notes (Signed)
Procedure Name: Intubation Date/Time: 11/17/2022 12:48 PM  Performed by: Darletta Moll, CRNAPre-anesthesia Checklist: Patient identified, Emergency Drugs available, Suction available and Patient being monitored Patient Re-evaluated:Patient Re-evaluated prior to induction Oxygen Delivery Method: Circle system utilized Preoxygenation: Pre-oxygenation with 100% oxygen Induction Type: IV induction Ventilation: Oral airway inserted - appropriate to patient size and Two handed mask ventilation required Laryngoscope Size: Glidescope and 4 Grade View: Grade I Tube type: Oral Tube size: 8.5 mm Number of attempts: 1 Airway Equipment and Method: Stylet and Oral airway Placement Confirmation: ETT inserted through vocal cords under direct vision, positive ETCO2 and breath sounds checked- equal and bilateral Secured at: 22 cm Tube secured with: Tape Dental Injury: Teeth and Oropharynx as per pre-operative assessment  Difficulty Due To: Difficulty was anticipated, Difficult Airway- due to anterior larynx and Difficult Airway- due to large tongue

## 2022-11-17 NOTE — Op Note (Signed)
Video Bronchoscopy with Endobronchial Ultrasound Procedure Note  Date of Operation: 11/17/2022  Pre-op Diagnosis: Mediastinal lymphadenopathy  Post-op Diagnosis: Same  Surgeon: Baltazar Apo  Assistants: None  Anesthesia: General endotracheal anesthesia  Operation: Flexible video fiberoptic bronchoscopy with endobronchial ultrasound and biopsies.  Estimated Blood Loss: Minimal  Complications: None apparent  Indications and History: [name] is a 81 y.o. male with history of CAD/CABG, hypertension and A-fib with systolic and diastolic CHF, diabetes.  He has been seen in our office by Dr. Melvyn Novas previously.  Is under evaluation for generalized adenopathy suspicious for B-cell for proliferative disorder.  He has hypermetabolic pretracheal lymphadenopathy on PET scan.  Recommendation made to achieve a tissue diagnosis via bronchoscopy with EBUS and nodal biopsies.  The risks, benefits, complications, treatment options and expected outcomes were discussed with the patient.  The possibilities of pneumothorax, pneumonia, reaction to medication, pulmonary aspiration, perforation of a viscus, bleeding, failure to diagnose a condition and creating a complication requiring transfusion or operation were discussed with the patient who freely signed the consent.    Description of Procedure: The patient was examined in the preoperative area and history and data from the preprocedure consultation were reviewed. It was deemed appropriate to proceed.  The patient was taken to [facility] endoscopy room 3, identified as Alejandro [name] and the procedure verified as Flexible Video Fiberoptic Bronchoscopy.  A Time Out was held and the above information confirmed. After being taken to the operating room general anesthesia was initiated and the patient  was orally intubated. The video fiberoptic bronchoscope was introduced via the endotracheal tube and a general inspection was performed which showed normal airways  throughout.  Minimal secretions.. The standard scope was then withdrawn and the endobronchial ultrasound was used to identify and characterize the peritracheal, hilar and bronchial lymph nodes. Inspection showed significantly enlarged node at station 4R with some associated blood vessels.  No other significant enlarged nodes. Using real-time ultrasound guidance Wang needle biopsies were take from Station 4R nodes and were sent for cytology.  11 passes were made, 1 for Quik stain, 10 to be split for cell block and flow cytometry if indicated.  The patient tolerated the procedure well without apparent complications. There was no significant blood loss. The bronchoscope was withdrawn. Anesthesia was reversed and the patient was taken to the PACU for recovery.   Samples: 1. Wang needle biopsies from 4R node  Plans:  The patient will be discharged from the PACU to home when recovered from anesthesia. We will review the cytology, pathology and microbiology results with the patient when they become available. Outpatient followup will be with Dr. Melvyn Novas and.  Dr. Delton Coombes.    Baltazar Apo, MD, PhD 11/17/2022, 1:30 PM  Pulmonary and Critical Care [phone] or if no answer before 7:00PM call [phone] For any issues after 7:00PM please call eLink [phone]

## 2022-11-17 NOTE — Discharge Instructions (Signed)
Flexible Bronchoscopy, Care After This sheet gives you information about how to care for yourself after your test. Your doctor may also give you more specific instructions. If you have problems or questions, contact your doctor. Follow these instructions at home: Eating and drinking When your numbness is gone and your cough and gag reflexes have come back, you may: Eat only soft foods. Slowly drink liquids. The day after the test, go back to your normal diet. Driving Do not drive for 24 hours if you were given a medicine to help you relax (sedative). Do not drive or use heavy machinery while taking prescription pain medicine. General instructions  Take over-the-counter and prescription medicines only as told by your doctor. Return to your normal activities as told. Ask what activities are safe for you. Do not use any products that have nicotine or tobacco in them. This includes cigarettes and e-cigarettes. If you need help quitting, ask your doctor. Keep all follow-up visits as told by your doctor. This is important. It is very important if you had a tissue sample (biopsy) taken. Get help right away if: You have shortness of breath that gets worse. You get light-headed. You feel like you are going to pass out (faint). You have chest pain. You cough up: More than a little blood. More blood than before. Summary Do not eat or drink anything (not even water) for 2 hours after your test, or until your numbing medicine wears off. Do not use cigarettes. Do not use e-cigarettes. Get help right away if you have chest pain.  Please call our office for any questions or concerns.  [phone].  You can restart your Eliquis on 11/18/2022.   This information is not intended to replace advice given to you by your health care provider. Make sure you discuss any questions you have with your health care provider. Document Released: 09/14/2009 Document Revised: 10/30/2017 Document Reviewed:  12/05/2016 Elsevier Patient Education  2020 [employer].

## 2022-11-17 NOTE — Transfer of Care (Signed)
Immediate Anesthesia Transfer of Care Note  Patient: Alejandro [name]  Procedure(s) Performed: VIDEO BRONCHOSCOPY WITH ENDOBRONCHIAL ULTRASOUND (Right) BRONCHIAL NEEDLE ASPIRATION BIOPSIES  Patient Location: PACU  Anesthesia Type:General  Level of Consciousness: drowsy and patient cooperative  Airway & Oxygen Therapy: Patient Spontanous Breathing and Patient connected to face mask oxygen  Post-op Assessment: Report given to RN, Post -op Vital signs reviewed and stable, and Patient moving all extremities X 4  Post vital signs: Reviewed and stable  Last Vitals:  Vitals Value Taken Time  BP 160/92 11/17/22 1334  Temp    Pulse 69 11/17/22 1337  Resp 17 11/17/22 1337  SpO2 93 % 11/17/22 1337  Vitals shown include unvalidated device data.  Last Pain:  Vitals:   11/17/22 1028  TempSrc:   PainSc: 0-No pain         Complications:  Encounter Notable Events  Notable Event Outcome Phase Comment  Difficult to intubate - expected  Intraprocedure Filed from anesthesia note documentation.

## 2022-11-18 ENCOUNTER — Ambulatory Visit: Payer: Medicare HMO | Admitting: Hematology

## 2022-11-21 ENCOUNTER — Encounter (HOSPITAL_COMMUNITY): Payer: Self-pay | Admitting: Emergency Medicine

## 2022-11-21 ENCOUNTER — Telehealth: Payer: Self-pay | Admitting: Emergency Medicine

## 2022-11-21 LAB — CYTOLOGY - NON PAP

## 2022-11-21 NOTE — Telephone Encounter (Signed)
I reviewed the patient's bronchoscopy results with his niece Renee by phone.  His cytology was most consistent with B-cell lymphoma.  Apparently there was not enough material in the cellblock to perform flow cytometry although I took 12 passes of the lymph node.  I think this information coupled with his original nodal biopsy probably gives [place] a diagnosis of B-cell lymphoma.  The patient is going to follow-up with Dr. Delton Coombes to review, plan next steps.  FYI to Dr. Delton Coombes

## 2022-12-02 ENCOUNTER — Other Ambulatory Visit: Payer: Self-pay

## 2022-12-02 ENCOUNTER — Encounter: Payer: Self-pay | Admitting: Surgery

## 2022-12-02 ENCOUNTER — Ambulatory Visit: Payer: Medicare Other | Admitting: Surgery

## 2022-12-02 VITALS — BP 137/76 | HR 72 | Temp 97.8°F | Resp 18 | Ht 71.0 in | Wt 237.0 lb

## 2022-12-02 DIAGNOSIS — R590 Localized enlarged lymph nodes: Secondary | ICD-10-CM

## 2022-12-02 NOTE — Progress Notes (Signed)
[facility] History and Physical  Reason for Referral: Left inguinal lymphadenopathy Referring Physician: Dr. Charlies Constable Alejandro [name] is a 82 y.o. male.  HPI: Patient is a 82 year old male who presents for left inguinal lymphadenopathy.  He has had 6 months of shortness of breath and decreased energy.  He denies any fevers, chills, or night sweats.  He has been seen by Dr. Delton Coombes for generalized lymphadenopathy, which was PET positive.  He underwent a retroperitoneal lymph node biopsy in November, which demonstrated atypical lymphoid proliferation concerning for lymphoproliferative disorder.  He subsequently underwent video bronchoscopy with endobronchial ultrasound and biopsies of enlarged mediastinal lymph nodes.  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  He denies ever feeling any lymphadenopathy.  His past medical history is significant for diabetes, hyperlipidemia, hypertension, coronary artery disease with an MI status post CABG.  He is currently taking Eliquis.  He denies any history of abdominal surgeries.  He denies use of tobacco products, alcohol, and illicit drugs.  Past Medical History:  Diagnosis Date   AAA Ultrasound 01/11/2020    AAA [place] 01/11/20:  no abdominal aortic aneurysm, 2.4 cm   Aortic insufficiency    a. mild-mod by intraop TEE 05/2018 (no aortic stenosis) // Echo 2/21: mild //  Echo 9/22: trivial   Aortic stenosis    Echo 9/22: mild (mean 16) // Echo 2/21: mild (mean 18.7)   CAD (coronary artery disease)    a. NSTEMI 05/2018 with multivessel disease -> s/p CABGx4 05/11/18 // Myoview 06/07/20: inf scar w mild peri-infarct ischemia, EF 37; Int Risk   Carotid artery disease ([place])    PreCABG [place] 05/07/18: Bilat ICA 1-39   CKD (chronic kidney disease), stage III ([place])    HFimpEF (heart failure with improved EF)    Echocardiogram 01/11/20: EF 45, Gr 3 DD, mild AI, mild AS (mean 18.7 mmHg) // Echocardiogram 08/14/21:  EF 55-60, inf HK, mild  LVH, Gr 1 DD, normal RVSF, RVSP 25.3, mild LAE, trivial MR, MAC, trivial AI, mild AS (mean 16 mmHg, Vmax 283 cm/s, DI 0.41)   History of nephrolithiasis 2004   Hyperlipidemia    Hypertension    IBS (irritable colon syndrome)    Ischemic cardiomyopathy    Mitral regurgitation    Echo 9/22: trivial MR   Postoperative atrial fibrillation (HCC)    PVC's (premature ventricular contractions)    Reflux    Type 2 diabetes mellitus ([place])     Past Surgical History:  Procedure Laterality Date   BRONCHIAL NEEDLE ASPIRATION BIOPSY  11/17/2022   Procedure: BRONCHIAL NEEDLE ASPIRATION BIOPSIES;  Surgeon: Collene Gobble, MD;  Location: [place];  Service: Cardiopulmonary;;   CORONARY ARTERY BYPASS GRAFT N/A 05/11/2018   Procedure: CORONARY ARTERY BYPASS GRAFTING (CABG), on pump, times four, using left internal mammary artery and endoscopically harvested right greater saphenous leg vein.;  Surgeon: Ivin Poot, MD;  Location: [place];  Service: Open Heart Surgery;  Laterality: N/A;   ESOPHAGOGASTRODUODENOSCOPY  02/04/2012   Procedure: ESOPHAGOGASTRODUODENOSCOPY (EGD);  Surgeon: Rogene Houston, MD;  [facility];  Service: Endoscopy;  Laterality: N/A;  100   LEFT HEART CATH AND CORONARY ANGIOGRAPHY N/A 05/04/2018   Procedure: LEFT HEART CATH AND CORONARY ANGIOGRAPHY;  Surgeon: Belva Crome, MD;  Location: [place] [facility];  Service: Cardiovascular;  Laterality: N/A;   NASAL FRACTURE SURGERY     TEE WITHOUT CARDIOVERSION N/A 05/11/2018   Procedure: TRANSESOPHAGEAL ECHOCARDIOGRAM (TEE);  Surgeon: Lucianne Lei  Donney Rankins, MD;  Location: [place];  Service: Open Heart Surgery;  Laterality: N/A;   VIDEO BRONCHOSCOPY WITH ENDOBRONCHIAL ULTRASOUND Right 11/17/2022   Procedure: VIDEO BRONCHOSCOPY WITH ENDOBRONCHIAL ULTRASOUND;  Surgeon: Collene Gobble, MD;  Location: [place];  Service: Cardiopulmonary;  Laterality: Right;    Family History  Problem Relation Age of Onset   Diabetes Mellitus II  Mother    Heart attack Father    Heart attack Brother    Heart disease Other    Arthritis Other    Asthma Other    Diabetes Other     Social History   Tobacco Use   Smoking status: Never   Smokeless tobacco: Never  Vaping Use   Vaping Use: Never used  Substance Use Topics   Alcohol use: No    Alcohol/week: 0.0 standard drinks of alcohol   Drug use: No    Medications: I have reviewed the patient's current medications. Allergies as of 12/02/2022   No Known Allergies      Medication List        Accurate as of December 02, 2022  9:02 AM. If you have any questions, ask your nurse or doctor.          albuterol 108 (90 Base) MCG/ACT inhaler Commonly known as: VENTOLIN HFA Inhale 2 puffs into the lungs every 6 (six) hours as needed for wheezing or shortness of breath.   apixaban 5 MG Tabs tablet Commonly known as: Eliquis Take 1 tablet (5 mg total) by mouth 2 (two) times daily. Okay to restart on 11/18/2022   atorvastatin 40 MG tablet Commonly known as: LIPITOR Take 40 mg by mouth at bedtime.   cyanocobalamin 1000 MCG tablet Commonly known as: VITAMIN B12 Take 1,000 mcg by mouth daily.   cyanocobalamin 1000 MCG/ML injection Commonly known as: VITAMIN B12 Inject 1,000 mcg into the skin every 30 (thirty) days.   dofetilide 500 MCG capsule Commonly known as: TIKOSYN Take 500 mcg by mouth 2 (two) times daily.   famotidine 20 MG tablet Commonly known as: Pepcid Take 1 tablet (20 mg total) by mouth 2 (two) times daily.   fluticasone 50 MCG/ACT nasal spray Commonly known as: FLONASE Place 1 spray into both nostrils daily.   glipiZIDE 10 MG 24 hr tablet Commonly known as: GLUCOTROL XL Take 10 mg by mouth daily.   hydrochlorothiazide 25 MG tablet Commonly known as: HYDRODIURIL Take 25 mg by mouth daily.   ibuprofen 200 MG tablet Commonly known as: ADVIL Take 400-600 mg by mouth every 6 (six) hours as needed for moderate pain.   iVIZIA Dry Eyes 0.5 %  Soln Generic drug: Povidone (PF) Place 1 drop into both eyes daily as needed (dry eyes).   loratadine 10 MG tablet Commonly known as: CLARITIN Take 10 mg by mouth daily.   Melatonin 10 MG Caps Take 30 mg by mouth at bedtime as needed (sleep).   metFORMIN 750 MG 24 hr tablet Commonly known as: GLUCOPHAGE-XR Take 750 mg by mouth 2 (two) times daily with a meal.   pantoprazole 40 MG tablet Commonly known as: PROTONIX Take 40 mg by mouth daily.   potassium chloride 10 MEQ CR capsule Commonly known as: MICRO-K Take 10 mEq by mouth daily.   predniSONE 10 MG tablet Commonly known as: DELTASONE Take  4 each am x 2 days,   2 each am x 2 days,  1 each am x 2 days and stop   spironolactone 25 MG tablet Commonly known  as: ALDACTONE Take 1 tablet by mouth daily.   valsartan 40 MG tablet Commonly known as: Diovan Take 1 tablet (40 mg total) by mouth daily.         ROS:  Constitutional: negative for chills, fatigue, and fevers Eyes: negative for visual disturbance and pain Ears, nose, mouth, throat, and face: negative for ear drainage and sore throat Respiratory: positive for shortness of breath, negative for cough and wheezing Cardiovascular: negative for chest pain and palpitations Gastrointestinal: positive for abdominal pain, negative for nausea, reflux symptoms, and vomiting Genitourinary:positive for frequency, negative for dysuria Integument/breast: negative for dryness and rash Hematologic/lymphatic: negative for bleeding and lymphadenopathy Musculoskeletal:negative for back pain and neck pain Neurological: negative for dizziness and tremors Endocrine: negative for temperature intolerance  There were no vitals taken for this visit. Physical Exam Vitals reviewed.  Constitutional:      Appearance: Normal appearance.  HENT:     Head: Normocephalic and atraumatic.  Eyes:     Extraocular Movements: Extraocular movements intact.     Pupils: Pupils are equal, round,  and reactive to light.  Cardiovascular:     Rate and Rhythm: Normal rate and regular rhythm.  Pulmonary:     Effort: Pulmonary effort is normal.     Breath sounds: Normal breath sounds.  Abdominal:     General: There is no distension.     Palpations: Abdomen is soft.     Tenderness: There is no abdominal tenderness.  Genitourinary:    Comments: Left inguinal region with small palpable lymph node, nontender to palpation Musculoskeletal:        General: Normal range of motion.     Cervical back: Normal range of motion.  Skin:    General: Skin is warm and dry.  Neurological:     General: No focal deficit present.     Mental Status: He is alert and oriented to person, place, and time.  Psychiatric:        Mood and Affect: Mood normal.        Behavior: Behavior normal.     Results: No results found for this or any previous visit (from the past 48 hour(s)).  No results found.   Assessment & Plan:  Alejandro [name] is a 82 y.o. male who presents for evaluation of left inguinal lymphadenopathy.  -I discussed with the patient that he has had multiple recent lymph node biopsies, but unfortunately they have not had enough tissue to make a clear diagnosis.  For this reason, he was sent to me to discuss left inguinal lymph node biopsy -The risk and benefits of left inguinal lymph node biopsy were discussed including but not limited to bleeding, infection, injury to surrounding structures, need for additional procedures, and normal pathology without lymph node in specimen.  After careful consideration, Alejandro [name] has decided to proceed with left inguinal lymph node biopsy.  -Patient tentatively scheduled for surgery on 1/15 -Information provided to the patient regarding lymphadenopathy -Advised patient that he will need to hold his Eliquis 2 days prior to surgery  All questions were answered to the satisfaction of the patient and family.  Graciella Freer, DO [facility] [address] [place], [place] [address] [phone] (office)

## 2022-12-04 NOTE — H&P (Signed)
[facility] History and Physical  Reason for Referral: Left inguinal lymphadenopathy Referring Physician: Dr. Charlies Constable Alejandro [name] is a 82 y.o. male.  HPI: Patient is a 82 year old male who presents for left inguinal lymphadenopathy.  He has had 6 months of shortness of breath and decreased energy.  He denies any fevers, chills, or night sweats.  He has been seen by Dr. Delton Coombes for generalized lymphadenopathy, which was PET positive.  He underwent a retroperitoneal lymph node biopsy in November, which demonstrated atypical lymphoid proliferation concerning for lymphoproliferative disorder.  He subsequently underwent video bronchoscopy with endobronchial ultrasound and biopsies of enlarged mediastinal lymph nodes.  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  He denies ever feeling any lymphadenopathy.  His past medical history is significant for diabetes, hyperlipidemia, hypertension, coronary artery disease with an MI status post CABG.  He is currently taking Eliquis.  He denies any history of abdominal surgeries.  He denies use of tobacco products, alcohol, and illicit drugs.  Past Medical History:  Diagnosis Date   AAA Ultrasound 01/11/2020    AAA [place] 01/11/20:  no abdominal aortic aneurysm, 2.4 cm   Aortic insufficiency    a. mild-mod by intraop TEE 05/2018 (no aortic stenosis) // Echo 2/21: mild //  Echo 9/22: trivial   Aortic stenosis    Echo 9/22: mild (mean 16) // Echo 2/21: mild (mean 18.7)   CAD (coronary artery disease)    a. NSTEMI 05/2018 with multivessel disease -> s/p CABGx4 05/11/18 // Myoview 06/07/20: inf scar w mild peri-infarct ischemia, EF 37; Int Risk   Carotid artery disease ([place])    PreCABG [place] 05/07/18: Bilat ICA 1-39   CKD (chronic kidney disease), stage III ([place])    HFimpEF (heart failure with improved EF)    Echocardiogram 01/11/20: EF 45, Gr 3 DD, mild AI, mild AS (mean 18.7 mmHg) // Echocardiogram 08/14/21:  EF 55-60, inf HK, mild  LVH, Gr 1 DD, normal RVSF, RVSP 25.3, mild LAE, trivial MR, MAC, trivial AI, mild AS (mean 16 mmHg, Vmax 283 cm/s, DI 0.41)   History of nephrolithiasis 2004   Hyperlipidemia    Hypertension    IBS (irritable colon syndrome)    Ischemic cardiomyopathy    Mitral regurgitation    Echo 9/22: trivial MR   Postoperative atrial fibrillation (HCC)    PVC's (premature ventricular contractions)    Reflux    Type 2 diabetes mellitus ([place])     Past Surgical History:  Procedure Laterality Date   BRONCHIAL NEEDLE ASPIRATION BIOPSY  11/17/2022   Procedure: BRONCHIAL NEEDLE ASPIRATION BIOPSIES;  Surgeon: Collene Gobble, MD;  Location: [place];  Service: Cardiopulmonary;;   CORONARY ARTERY BYPASS GRAFT N/A 05/11/2018   Procedure: CORONARY ARTERY BYPASS GRAFTING (CABG), on pump, times four, using left internal mammary artery and endoscopically harvested right greater saphenous leg vein.;  Surgeon: Ivin Poot, MD;  Location: [place];  Service: Open Heart Surgery;  Laterality: N/A;   ESOPHAGOGASTRODUODENOSCOPY  02/04/2012   Procedure: ESOPHAGOGASTRODUODENOSCOPY (EGD);  Surgeon: Rogene Houston, MD;  [facility];  Service: Endoscopy;  Laterality: N/A;  100   LEFT HEART CATH AND CORONARY ANGIOGRAPHY N/A 05/04/2018   Procedure: LEFT HEART CATH AND CORONARY ANGIOGRAPHY;  Surgeon: Belva Crome, MD;  Location: [place] [facility];  Service: Cardiovascular;  Laterality: N/A;   NASAL FRACTURE SURGERY     TEE WITHOUT CARDIOVERSION N/A 05/11/2018   Procedure: TRANSESOPHAGEAL ECHOCARDIOGRAM (TEE);  Surgeon: Lucianne Lei  Donney Rankins, MD;  Location: [place];  Service: Open Heart Surgery;  Laterality: N/A;   VIDEO BRONCHOSCOPY WITH ENDOBRONCHIAL ULTRASOUND Right 11/17/2022   Procedure: VIDEO BRONCHOSCOPY WITH ENDOBRONCHIAL ULTRASOUND;  Surgeon: Collene Gobble, MD;  Location: [place];  Service: Cardiopulmonary;  Laterality: Right;    Family History  Problem Relation Age of Onset   Diabetes Mellitus II  Mother    Heart attack Father    Heart attack Brother    Heart disease Other    Arthritis Other    Asthma Other    Diabetes Other     Social History   Tobacco Use   Smoking status: Never   Smokeless tobacco: Never  Vaping Use   Vaping Use: Never used  Substance Use Topics   Alcohol use: No    Alcohol/week: 0.0 standard drinks of alcohol   Drug use: No    Medications: I have reviewed the patient's current medications. Allergies as of 12/02/2022   No Known Allergies      Medication List        Accurate as of December 02, 2022  9:02 AM. If you have any questions, ask your nurse or doctor.          albuterol 108 (90 Base) MCG/ACT inhaler Commonly known as: VENTOLIN HFA Inhale 2 puffs into the lungs every 6 (six) hours as needed for wheezing or shortness of breath.   apixaban 5 MG Tabs tablet Commonly known as: Eliquis Take 1 tablet (5 mg total) by mouth 2 (two) times daily. Okay to restart on 11/18/2022   atorvastatin 40 MG tablet Commonly known as: LIPITOR Take 40 mg by mouth at bedtime.   cyanocobalamin 1000 MCG tablet Commonly known as: VITAMIN B12 Take 1,000 mcg by mouth daily.   cyanocobalamin 1000 MCG/ML injection Commonly known as: VITAMIN B12 Inject 1,000 mcg into the skin every 30 (thirty) days.   dofetilide 500 MCG capsule Commonly known as: TIKOSYN Take 500 mcg by mouth 2 (two) times daily.   famotidine 20 MG tablet Commonly known as: Pepcid Take 1 tablet (20 mg total) by mouth 2 (two) times daily.   fluticasone 50 MCG/ACT nasal spray Commonly known as: FLONASE Place 1 spray into both nostrils daily.   glipiZIDE 10 MG 24 hr tablet Commonly known as: GLUCOTROL XL Take 10 mg by mouth daily.   hydrochlorothiazide 25 MG tablet Commonly known as: HYDRODIURIL Take 25 mg by mouth daily.   ibuprofen 200 MG tablet Commonly known as: ADVIL Take 400-600 mg by mouth every 6 (six) hours as needed for moderate pain.   iVIZIA Dry Eyes 0.5 %  Soln Generic drug: Povidone (PF) Place 1 drop into both eyes daily as needed (dry eyes).   loratadine 10 MG tablet Commonly known as: CLARITIN Take 10 mg by mouth daily.   Melatonin 10 MG Caps Take 30 mg by mouth at bedtime as needed (sleep).   metFORMIN 750 MG 24 hr tablet Commonly known as: GLUCOPHAGE-XR Take 750 mg by mouth 2 (two) times daily with a meal.   pantoprazole 40 MG tablet Commonly known as: PROTONIX Take 40 mg by mouth daily.   potassium chloride 10 MEQ CR capsule Commonly known as: MICRO-K Take 10 mEq by mouth daily.   predniSONE 10 MG tablet Commonly known as: DELTASONE Take  4 each am x 2 days,   2 each am x 2 days,  1 each am x 2 days and stop   spironolactone 25 MG tablet Commonly known  as: ALDACTONE Take 1 tablet by mouth daily.   valsartan 40 MG tablet Commonly known as: Diovan Take 1 tablet (40 mg total) by mouth daily.         ROS:  Constitutional: negative for chills, fatigue, and fevers Eyes: negative for visual disturbance and pain Ears, nose, mouth, throat, and face: negative for ear drainage and sore throat Respiratory: positive for shortness of breath, negative for cough and wheezing Cardiovascular: negative for chest pain and palpitations Gastrointestinal: positive for abdominal pain, negative for nausea, reflux symptoms, and vomiting Genitourinary:positive for frequency, negative for dysuria Integument/breast: negative for dryness and rash Hematologic/lymphatic: negative for bleeding and lymphadenopathy Musculoskeletal:negative for back pain and neck pain Neurological: negative for dizziness and tremors Endocrine: negative for temperature intolerance  There were no vitals taken for this visit. Physical Exam Vitals reviewed.  Constitutional:      Appearance: Normal appearance.  HENT:     Head: Normocephalic and atraumatic.  Eyes:     Extraocular Movements: Extraocular movements intact.     Pupils: Pupils are equal, round,  and reactive to light.  Cardiovascular:     Rate and Rhythm: Normal rate and regular rhythm.  Pulmonary:     Effort: Pulmonary effort is normal.     Breath sounds: Normal breath sounds.  Abdominal:     General: There is no distension.     Palpations: Abdomen is soft.     Tenderness: There is no abdominal tenderness.  Genitourinary:    Comments: Left inguinal region with small palpable lymph node, nontender to palpation Musculoskeletal:        General: Normal range of motion.     Cervical back: Normal range of motion.  Skin:    General: Skin is warm and dry.  Neurological:     General: No focal deficit present.     Mental Status: He is alert and oriented to person, place, and time.  Psychiatric:        Mood and Affect: Mood normal.        Behavior: Behavior normal.     Results: No results found for this or any previous visit (from the past 48 hour(s)).  No results found.   Assessment & Plan:  Alejandro [name] is a 82 y.o. male who presents for evaluation of left inguinal lymphadenopathy.  -I discussed with the patient that he has had multiple recent lymph node biopsies, but unfortunately they have not had enough tissue to make a clear diagnosis.  For this reason, he was sent to me to discuss left inguinal lymph node biopsy -The risk and benefits of left inguinal lymph node biopsy were discussed including but not limited to bleeding, infection, injury to surrounding structures, need for additional procedures, and normal pathology without lymph node in specimen.  After careful consideration, Alejandro [name] has decided to proceed with left inguinal lymph node biopsy.  -Patient tentatively scheduled for surgery on 1/15 -Information provided to the patient regarding lymphadenopathy -Advised patient that he will need to hold his Eliquis 2 days prior to surgery  All questions were answered to the satisfaction of the patient and family.  Graciella Freer, DO [facility] [address] [place], [place] [address] [phone] (office)

## 2022-12-05 LAB — SURGICAL PATHOLOGY

## 2022-12-07 ENCOUNTER — Emergency Department (HOSPITAL_COMMUNITY): Payer: Medicare Other

## 2022-12-07 ENCOUNTER — Encounter (HOSPITAL_COMMUNITY): Payer: Self-pay | Admitting: Emergency Medicine

## 2022-12-07 ENCOUNTER — Emergency Department (HOSPITAL_COMMUNITY)
Admission: EM | Admit: 2022-12-07 | Discharge: 2022-12-07 | Disposition: A | Discharge status: Home / Self Care | Payer: Medicare Other | Attending: Emergency Medicine | Admitting: Emergency Medicine

## 2022-12-07 ENCOUNTER — Other Ambulatory Visit: Payer: Self-pay

## 2022-12-07 DIAGNOSIS — E1122 Type 2 diabetes mellitus with diabetic chronic kidney disease: Secondary | ICD-10-CM | POA: Diagnosis not present

## 2022-12-07 DIAGNOSIS — R079 Chest pain, unspecified: Secondary | ICD-10-CM | POA: Diagnosis not present

## 2022-12-07 DIAGNOSIS — Z7984 Long term (current) use of oral hypoglycemic drugs: Secondary | ICD-10-CM | POA: Insufficient documentation

## 2022-12-07 DIAGNOSIS — N189 Chronic kidney disease, unspecified: Secondary | ICD-10-CM | POA: Diagnosis not present

## 2022-12-07 DIAGNOSIS — I130 Hypertensive heart and chronic kidney disease with heart failure and stage 1 through stage 4 chronic kidney disease, or unspecified chronic kidney disease: Secondary | ICD-10-CM | POA: Diagnosis not present

## 2022-12-07 DIAGNOSIS — I509 Heart failure, unspecified: Secondary | ICD-10-CM | POA: Diagnosis not present

## 2022-12-07 DIAGNOSIS — Z79899 Other long term (current) drug therapy: Secondary | ICD-10-CM | POA: Insufficient documentation

## 2022-12-07 DIAGNOSIS — R109 Unspecified abdominal pain: Secondary | ICD-10-CM | POA: Diagnosis present

## 2022-12-07 DIAGNOSIS — Z951 Presence of aortocoronary bypass graft: Secondary | ICD-10-CM | POA: Insufficient documentation

## 2022-12-07 DIAGNOSIS — Z7901 Long term (current) use of anticoagulants: Secondary | ICD-10-CM | POA: Diagnosis not present

## 2022-12-07 DIAGNOSIS — Z8679 Personal history of other diseases of the circulatory system: Secondary | ICD-10-CM | POA: Insufficient documentation

## 2022-12-07 DIAGNOSIS — K5900 Constipation, unspecified: Secondary | ICD-10-CM | POA: Insufficient documentation

## 2022-12-07 HISTORY — DX: Unspecified atrial fibrillation: I48.91

## 2022-12-07 LAB — CBC WITH DIFFERENTIAL/PLATELET
Abs Immature Granulocytes: 0.07 10*3/uL (ref 0.00–0.07)
Basophils Absolute: 0 10*3/uL (ref 0.0–0.1)
Basophils Relative: 0 %
Eosinophils Absolute: 0 10*3/uL (ref 0.0–0.5)
Eosinophils Relative: 0 %
HCT: 43.5 % (ref 39.0–52.0)
Hemoglobin: 14.8 g/dL (ref 13.0–17.0)
Immature Granulocytes: 1 %
Lymphocytes Relative: 18 %
Lymphs Abs: 1.5 10*3/uL (ref 0.7–4.0)
MCH: 30.5 pg (ref 26.0–34.0)
MCHC: 34 g/dL (ref 30.0–36.0)
MCV: 89.7 fL (ref 80.0–100.0)
Monocytes Absolute: 1.1 10*3/uL — ABNORMAL HIGH (ref 0.1–1.0)
Monocytes Relative: 13 %
Neutro Abs: 5.7 10*3/uL (ref 1.7–7.7)
Neutrophils Relative %: 68 %
Platelets: 301 10*3/uL (ref 150–400)
RBC: 4.85 MIL/uL (ref 4.22–5.81)
RDW: 13.3 % (ref 11.5–15.5)
WBC: 8.4 10*3/uL (ref 4.0–10.5)
nRBC: 0 % (ref 0.0–0.2)

## 2022-12-07 LAB — COMPREHENSIVE METABOLIC PANEL
ALT: 20 U/L (ref 0–44)
AST: 21 U/L (ref 15–41)
Albumin: 3.7 g/dL (ref 3.5–5.0)
Alkaline Phosphatase: 97 U/L (ref 38–126)
Anion gap: 11 (ref 5–15)
BUN: 15 mg/dL (ref 8–23)
CO2: 24 mmol/L (ref 22–32)
Calcium: 8.5 mg/dL — ABNORMAL LOW (ref 8.9–10.3)
Chloride: 95 mmol/L — ABNORMAL LOW (ref 98–111)
Creatinine, Ser: 1.18 mg/dL (ref 0.61–1.24)
GFR, Estimated: >60 mL/min (ref >60)
Glucose, Bld: 231 mg/dL — ABNORMAL HIGH (ref 70–99)
Potassium: 4.3 mmol/L (ref 3.5–5.1)
Sodium: 130 mmol/L — ABNORMAL LOW (ref 135–145)
Total Bilirubin: 0.9 mg/dL (ref 0.3–1.2)
Total Protein: 7.3 g/dL (ref 6.5–8.1)

## 2022-12-07 LAB — I-STAT CHEM 8, ED
BUN: 14 mg/dL (ref 8–23)
Calcium, Ion: 1.12 mmol/L — ABNORMAL LOW (ref 1.15–1.40)
Chloride: 97 mmol/L — ABNORMAL LOW (ref 98–111)
Creatinine, Ser: 1 mg/dL (ref 0.61–1.24)
Glucose, Bld: 223 mg/dL — ABNORMAL HIGH (ref 70–99)
HCT: 39 % (ref 39.0–52.0)
Hemoglobin: 13.3 g/dL (ref 13.0–17.0)
Potassium: 4.2 mmol/L (ref 3.5–5.1)
Sodium: 133 mmol/L — ABNORMAL LOW (ref 135–145)
TCO2: 24 mmol/L (ref 22–32)

## 2022-12-07 LAB — PROTIME-INR
INR: 1 (ref 0.8–1.2)
Prothrombin Time: 13.1 seconds (ref 11.4–15.2)

## 2022-12-07 LAB — CBG MONITORING, ED: Glucose-Capillary: 224 mg/dL — ABNORMAL HIGH (ref 70–99)

## 2022-12-07 LAB — URINALYSIS, ROUTINE W REFLEX MICROSCOPIC
Bilirubin Urine: NEGATIVE
Glucose, UA: 150 mg/dL — AB
Hgb urine dipstick: NEGATIVE
Ketones, ur: NEGATIVE mg/dL
Leukocytes,Ua: NEGATIVE
Nitrite: NEGATIVE
Protein, ur: NEGATIVE mg/dL
Specific Gravity, Urine: 1.014 (ref 1.005–1.030)
pH: 8 (ref 5.0–8.0)

## 2022-12-07 LAB — POC OCCULT BLOOD, ED: Fecal Occult Bld: NEGATIVE

## 2022-12-07 LAB — LACTIC ACID, PLASMA: Lactic Acid, Venous: 1.9 mmol/L (ref 0.5–1.9)

## 2022-12-07 LAB — TROPONIN I (HIGH SENSITIVITY)
Troponin I (High Sensitivity): 10 ng/L (ref <18)
Troponin I (High Sensitivity): 9 ng/L (ref <18)

## 2022-12-07 LAB — MAGNESIUM: Magnesium: 2.1 mg/dL (ref 1.7–2.4)

## 2022-12-07 LAB — LIPASE, BLOOD: Lipase: 31 U/L (ref 11–51)

## 2022-12-07 MED ORDER — FENTANYL CITRATE PF 50 MCG/ML IJ SOSY
50.0000 ug | PREFILLED_SYRINGE | Freq: Once | INTRAMUSCULAR | Status: AC
  Administered 2022-12-07: 50 ug via INTRAVENOUS
  Filled 2022-12-07: qty 1

## 2022-12-07 MED ORDER — ASPIRIN 81 MG PO CHEW
324.0000 mg | CHEWABLE_TABLET | Freq: Once | ORAL | Status: AC
  Administered 2022-12-07: 324 mg via ORAL
  Filled 2022-12-07: qty 4

## 2022-12-07 MED ORDER — MAGNESIUM CITRATE PO SOLN: 1.0000 | Freq: Once | ORAL | 0 refills | Status: AC

## 2022-12-07 MED ORDER — IOHEXOL 350 MG/ML SOLN
80.0000 mL | Freq: Once | INTRAVENOUS | Status: AC | PRN
  Administered 2022-12-07: 80 mL via INTRAVENOUS

## 2022-12-07 MED ORDER — POLYETHYLENE GLYCOL 3350 17 G PO PACK: 17.0000 g | PACK | Freq: Every day | ORAL | 0 refills | Status: DC

## 2022-12-07 NOTE — Discharge Instructions (Addendum)
On your CT scan, it does look like there is slight increase in size of a right lung mass as well as the lymph nodes behind your abdomen.  You will need to follow-up with Dr. Raliegh Ip about this.  In terms of your constipation: -A prescription for MiraLAX was sent to your pharmacy.  This is also available over-the-counter.  Typical dose is 1 packet per day.  For a bowel cleanout dose, take 4-6 capfuls initially.  You can mix this in 12 ounces of water, juice, Gatorade.  You can continue to take this daily at a smaller dose to maintain soft stools.   -A daily fiber supplement is also helpful.  These are available over-the-counter.  A common fiber supplement is called Metamucil.   -A prescription for magnesium citrate was also sent to your pharmacy.  This will also help you move your bowels.  Return to the emergency department for any new or worsening symptoms of concern.

## 2022-12-07 NOTE — ED Provider Notes (Signed)
[facility] EMERGENCY DEPARTMENT Provider Note   CSN: [id number] Arrival date & time: 12/07/22  0930     History  Chief Complaint  Patient presents with   Chest Pain    Alejandro [name] is a 82 y.o. male.  HPI Patient presents for abdominal pain and chest pain.  Medical history includes HTN, DM, HLD, GERD, neuropathy, CKD, CAD s/p CABG x 4, CHF.  He has recently been seen by oncology for generalized lymphadenopathy.  He underwent retroperitoneal lymph node biopsy in November which showed atypical lymphoid proliferation concerning for lymphoproliferative disorder.  He underwent bronchoscopy with biopsies of mediastinal lymph nodes.  These were suspicious for B-cell lymphoma.  He has inguinal lymph node biopsy scheduled for 1/15.  Typically, patient will have a bowel movement every day.  Earlier this week, he had some constipation and left-sided abdominal pain.  He took an over-the-counter stool softener and was able to have a bowel movement that he describes as black in color.  This was 2 days ago.  Since then, he has not had any further bowel movements.  He has not taken any further therapies for constipation.  He states that he still is passing gas.  He has had ongoing left-sided abdominal pain as well as left-sided chest pain for the past 2 days.  He has not taken any medications at home to treat this pain.  He denies any nausea, fevers, chills.    Home Medications Prior to Admission medications   Medication Sig Start Date End Date Taking? Authorizing Provider  magnesium citrate SOLN Take 296 mLs (1 Bottle total) by mouth once for 1 dose. 12/07/22 12/07/22 Yes Godfrey Pick, MD  polyethylene glycol (MIRALAX / GLYCOLAX) 17 g packet Take 17 g by mouth daily. 12/07/22  Yes Godfrey Pick, MD  albuterol (VENTOLIN HFA) 108 (90 Base) MCG/ACT inhaler Inhale 2 puffs into the lungs every 6 (six) hours as needed for wheezing or shortness of breath. 09/22/22   [provider]  apixaban (ELIQUIS) 5 MG  TABS tablet Take 1 tablet (5 mg total) by mouth 2 (two) times daily. Okay to restart on 11/18/2022 11/17/22   Collene Gobble, MD  atorvastatin (LIPITOR) 40 MG tablet Take 40 mg by mouth at bedtime.    [provider]  cyanocobalamin (VITAMIN B12) 1000 MCG tablet Take 1,000 mcg by mouth daily.    [provider]  cyanocobalamin (VITAMIN B12) 1000 MCG/ML injection Inject 1,000 mcg into the skin every 30 (thirty) days. 10/20/22   [provider]  dofetilide (TIKOSYN) 500 MCG capsule Take 500 mcg by mouth 2 (two) times daily.    [provider]  famotidine (PEPCID) 20 MG tablet Take 1 tablet (20 mg total) by mouth 2 (two) times daily. 11/17/22   Collene Gobble, MD  fluticasone (FLONASE) 50 MCG/ACT nasal spray Place 1 spray into both nostrils daily.    [provider]  glipiZIDE (GLUCOTROL XL) 10 MG 24 hr tablet Take 10 mg by mouth daily.    [provider]  hydrochlorothiazide (HYDRODIURIL) 25 MG tablet Take 25 mg by mouth daily. 06/12/21   [provider]  ibuprofen (ADVIL) 200 MG tablet Take 400-600 mg by mouth every 6 (six) hours as needed for moderate pain.    [provider]  loratadine (CLARITIN) 10 MG tablet Take 10 mg by mouth daily. 04/25/22   [provider]  Melatonin 10 MG CAPS Take 30 mg by mouth at bedtime as needed (sleep).  [provider]  metFORMIN (GLUCOPHAGE-XR) 750 MG 24 hr tablet Take 750 mg by mouth 2 (two) times daily with a meal.    [provider]  pantoprazole (PROTONIX) 40 MG tablet Take 40 mg by mouth daily. 02/07/20   [provider]  potassium chloride (MICRO-K) 10 MEQ CR capsule Take 10 mEq by mouth daily. 06/12/21   [provider]  Povidone, PF, (IVIZIA DRY EYES) 0.5 % SOLN Place 1 drop into both eyes daily as needed (dry eyes).    [provider]  spironolactone (ALDACTONE) 25 MG tablet Take 1 tablet by mouth daily. 08/21/22   [provider]  valsartan (DIOVAN) 40 MG tablet Take 1 tablet (40 mg total) by mouth daily. 01/07/22   Imogene Burn, PA-C      Allergies    Patient has no known allergies.    Review of Systems   Review of Systems  Cardiovascular:  Positive for chest pain.  Gastrointestinal:  Positive for abdominal pain, constipation and nausea.  All other systems reviewed and are negative.   Physical Exam Updated Vital Signs BP (!) 138/123   Pulse 68   Temp 98 F (36.7 C) (Oral)   Resp (!) 22   Ht 5\' 11"  (1.803 m)   Wt 107 kg   SpO2 96%   BMI 32.90 kg/m  Physical Exam Vitals and nursing note reviewed.  Constitutional:      General: He is not in acute distress.    Appearance: He is well-developed. He is not toxic-appearing or diaphoretic.  HENT:     Head: Normocephalic and atraumatic.  Eyes:     Conjunctiva/sclera: Conjunctivae normal.  Cardiovascular:     Rate and Rhythm: Normal rate. Rhythm irregular.     Heart sounds: No murmur heard. Pulmonary:     Effort: Pulmonary effort is normal. No tachypnea or respiratory distress.     Breath sounds: Normal breath sounds. No decreased breath sounds, wheezing, rhonchi or rales.  Chest:     Chest wall: No tenderness.  Abdominal:     Palpations: Abdomen is soft.     Tenderness: There is abdominal tenderness.  Musculoskeletal:        General: No swelling.     Cervical back: Normal range of motion and neck supple.     Right lower leg: No edema.     Left lower leg: No edema.  Skin:    General: Skin is warm and dry.     Coloration: Skin is not cyanotic or pale.  Neurological:     General: No focal deficit present.     Mental Status: He is alert and oriented to person, place, and time.  Psychiatric:        Mood and Affect: Mood normal.        Behavior: Behavior normal.     ED Results / Procedures / Treatments   Labs (all labs ordered are listed, but only abnormal results are displayed) Labs Reviewed  COMPREHENSIVE METABOLIC PANEL - Abnormal;  Notable for the following components:      Result Value   Sodium 130 (*)    Chloride 95 (*)    Glucose, Bld 231 (*)    Calcium 8.5 (*)    All other components within normal limits  CBC WITH DIFFERENTIAL/PLATELET - Abnormal; Notable for the following components:   Monocytes Absolute 1.1 (*)    All other components within normal limits  URINALYSIS, ROUTINE W REFLEX MICROSCOPIC - Abnormal; Notable for the  following components:   Glucose, UA 150 (*)    All other components within normal limits  CBG MONITORING, ED - Abnormal; Notable for the following components:   Glucose-Capillary 224 (*)    All other components within normal limits  I-STAT CHEM 8, ED - Abnormal; Notable for the following components:   Sodium 133 (*)    Chloride 97 (*)    Glucose, Bld 223 (*)    Calcium, Ion 1.12 (*)    All other components within normal limits  LIPASE, BLOOD  LACTIC ACID, PLASMA  PROTIME-INR  MAGNESIUM  OCCULT BLOOD X 1 CARD TO LAB, STOOL  POC OCCULT BLOOD, ED  TROPONIN I (HIGH SENSITIVITY)  TROPONIN I (HIGH SENSITIVITY)    EKG None  Radiology CT ABDOMEN PELVIS W CONTRAST  Result Date: 12/07/2022 CLINICAL DATA:  Left lower quadrant abdominal pain, lung cancer * Tracking Code: BO * EXAM: CT ABDOMEN AND PELVIS WITH CONTRAST TECHNIQUE: Multidetector CT imaging of the abdomen and pelvis was performed using the standard protocol following bolus administration of intravenous contrast. RADIATION DOSE REDUCTION: This exam was performed according to the departmental dose-optimization program which includes automated exposure control, adjustment of the mA and/or kV according to patient size and/or use of iterative reconstruction technique. CONTRAST:  43mL OMNIPAQUE IOHEXOL 350 MG/ML SOLN COMPARISON:  PET-CT, 09/11/2022 FINDINGS: Lower chest: Please see separately reported examination of the chest. Hepatobiliary: No solid liver abnormality is seen. No gallstones, gallbladder wall thickening, or biliary  dilatation. Pancreas: Unremarkable. No pancreatic ductal dilatation or surrounding inflammatory changes. Spleen: Normal in size without significant abnormality. Adrenals/Urinary Tract: Adrenal glands are unremarkable. Kidneys are normal, without renal calculi, solid lesion, or hydronephrosis. Bladder is unremarkable. Stomach/Bowel: Stomach is within normal limits. Appendix is not clearly visualized. No evidence of bowel wall thickening, distention, or inflammatory changes. Moderate burden of stool throughout the colon and rectum. Vascular/Lymphatic: Aortic atherosclerosis. Significant interval enlargement of bulky, matted retroperitoneal lymphadenopathy, largest aortocaval node conglomerate measuring 9.3 x 6.9 cm, previously 7.5 x 3.9 cm when measured similarly (series 2, image 45). Reproductive: No mass or other significant abnormality. Other: No abdominal wall hernia or abnormality. No ascites. Musculoskeletal: No acute or significant osseous findings. IMPRESSION: 1. Significant interval enlargement of bulky, matted retroperitoneal lymphadenopathy, consistent with worsened nodal metastatic disease. 2. No acute CT findings of the abdomen or pelvis to explain left lower quadrant abdominal pain. 3. Moderate burden of stool throughout the colon and rectum. Aortic Atherosclerosis (ICD10-I70.0). Electronically Signed   By: Delanna Ahmadi M.D.   On: 12/07/2022 13:20   CT Angio Chest PE W and/or Wo Contrast  Result Date: 12/07/2022 CLINICAL DATA:  PE suspected, lung cancer * Tracking Code: BO * EXAM: CT ANGIOGRAPHY CHEST WITH CONTRAST TECHNIQUE: Multidetector CT imaging of the chest was performed using the standard protocol during bolus administration of intravenous contrast. Multiplanar CT image reconstructions and MIPs were obtained to evaluate the vascular anatomy. RADIATION DOSE REDUCTION: This exam was performed according to the departmental dose-optimization program which includes automated exposure control,  adjustment of the mA and/or kV according to patient size and/or use of iterative reconstruction technique. CONTRAST:  73mL OMNIPAQUE IOHEXOL 350 MG/ML SOLN COMPARISON:  PET-CT, 09/11/2022 FINDINGS: Cardiovascular: Satisfactory opacification of the pulmonary arteries to the segmental level. No evidence of pulmonary embolism. Cardiomegaly. Three-vessel coronary artery calcifications status post median sternotomy and CABG. No pericardial effusion. Aortic atherosclerosis. Aortic valve calcifications. Mediastinum/Nodes: Slight interval enlargement of a right paratracheal/right hilar mass measuring 4.7 x 3.5 cm, previously 3.7  x 2.8 cm (series 6, image 106). Multiple additional prominent lymph nodes not appreciably changed (series 6, image 83). Thyroid gland, trachea, and esophagus demonstrate no significant findings. Lungs/Pleura: Mild bibasilar predominant pulmonary fibrosis with irregular peripheral interstitial opacity, septal thickening, and small areas of subpleural bronchiolectasis, unchanged. No acute airspace opacity. No pleural effusion or pneumothorax. Upper Abdomen: Please see separately reported examination of the abdomen and pelvis. Musculoskeletal: No chest wall abnormality. No acute osseous findings. Review of the MIP images confirms the above findings. IMPRESSION: 1. Negative examination for pulmonary embolism. 2. Slight interval enlargement of a right paratracheal/right hilar mass measuring 4.7 x 3.5 cm, previously 3.7 x 2.8 cm. Findings are consistent with progression of known lung malignancy. Multiple additional prominent lymph nodes not appreciably changed. 3. Mild bibasilar predominant pulmonary fibrosis with irregular peripheral interstitial opacity, septal thickening, and small areas of subpleural bronchiolectasis, unchanged. 4. Cardiomegaly and coronary artery disease. 5. Aortic valve calcifications. Correlate for echocardiographic evidence of aortic valve dysfunction. Aortic Atherosclerosis  (ICD10-I70.0). Electronically Signed   By: Delanna Ahmadi M.D.   On: 12/07/2022 13:12   DG Chest Portable 1 View  Result Date: 12/07/2022 CLINICAL DATA:  Left-sided chest pain EXAM: PORTABLE CHEST 1 VIEW COMPARISON:  07/11/2021 FINDINGS: Cardiomegaly status post median sternotomy and CABG. Pulmonary vascular prominence without overt pulmonary edema. IMPRESSION: No active disease. Electronically Signed   By: Delanna Ahmadi M.D.   On: 12/07/2022 10:36    Procedures Procedures    Medications Ordered in ED Medications  fentaNYL (SUBLIMAZE) injection 50 mcg (50 mcg Intravenous Given 12/07/22 1015)  aspirin chewable tablet 324 mg (324 mg Oral Given 12/07/22 1047)  iohexol (OMNIPAQUE) 350 MG/ML injection 80 mL (80 mLs Intravenous Contrast Given 12/07/22 1231)    ED Course/ Medical Decision Making/ A&P                           Medical Decision Making Amount and/or Complexity of Data Reviewed Labs: ordered. Radiology: ordered.  Risk OTC drugs. Prescription drug management.   This patient presents to the ED for concern of left-sided abdominal pain and chest pain, this involves an extensive number of treatment options, and is a complaint that carries with it a high risk of complications and morbidity.  The differential diagnosis includes constipation, diverticulitis, metastatic disease, bowel obstruction, ACS, pericarditis, costochondritis, pneumonia   Co morbidities that complicate the patient evaluation  HTN, DM, HLD, GERD, neuropathy, CKD, CAD s/p CABG x 4, CHF   Additional history obtained:  Additional history obtained from N/A External records from outside source obtained and reviewed including EMR   Lab Tests:  I Ordered, and personally interpreted labs.  The pertinent results include: Normal kidney function, mild hyponatremia and hypochloremia, normal hemoglobin, no leukocytosis, normal lipase, normal lactate, normal troponins x 2   Imaging Studies ordered:  I ordered imaging  studies including chest x-ray, CTA chest, CT of abdomen pelvis I independently visualized and interpreted imaging which showed slight increase in size of known right hilar mass; slight increase of retroperitoneal lymphadenopathy; moderate stool burden I agree with the radiologist interpretation   Cardiac Monitoring: / EKG:  The patient was maintained on a cardiac monitor.  I personally viewed and interpreted the cardiac monitored which showed an underlying rhythm of: Sinus rhythm  Problem List / ED Course / Critical interventions / Medication management  Patient presents for left-sided chest and abdominal pain over the past 2 days.  He also endorses recent constipation  and dark stools.  He does not take daily opioid medications.  Vital signs on arrival are normal.  On exam, patient is alert and oriented.  He has no increased work of breathing.  He endorses some mild chest pain at this time.  There is no associated tenderness.  Lungs are clear to auscultation.  His abdomen is soft but he does endorse left-sided abdominal tenderness.  Fentanyl was ordered for analgesia.  Diagnostic workup was initiated.  EKG shows very subtle ST segment abnormalities.  ASA was ordered.  Patient is currently on Eliquis. Per chart review, last heart cath was 2019.  Last stress test was 11 months ago.  Study was intermediate risk.  Patient's lab work is reassuring with no leukocytosis and normal troponins x 2.  He underwent CTA of chest and CT of abdomen and pelvis.  The studies showed slight interval increase in size of right hilar mass and retroperitoneal lymphadenopathy in addition to moderate stool burden.  Patient was informed of CT scan findings.  On DRE, there is no impacted stool in rectal vault and Hemoccult testing is negative.  He does prefer to treat his constipation at home.  He was discharged in stable condition. I ordered medication including ASA for ACS; fentanyl for analgesia Reevaluation of the patient  after these medicines showed that the patient improved I have reviewed the patients home medicines and have made adjustments as needed   Social Determinants of Health:  Has access to outpatient care         Final Clinical Impression(s) / ED Diagnoses Final diagnoses:  Constipation, unspecified constipation type    Rx / DC Orders ED Discharge Orders          Ordered    magnesium citrate SOLN   Once        12/07/22 1512    polyethylene glycol (MIRALAX / GLYCOLAX) 17 g packet  Daily        12/07/22 1512              Godfrey Pick, MD 12/07/22 1513

## 2022-12-07 NOTE — ED Triage Notes (Signed)
Patient c/o left side chest pain that started last night. Per patient pain radiates into left lower abd. Patient states "It feels like I need to have a BM but can't." Patient reports last BM x2 days ago. Shortness of breath and generalized weakness reported. HX of bypass surgery. Patient also had cardioversion x6 weeks ago at [facility] for A-fib. Denies swelling in extremities.

## 2022-12-08 ENCOUNTER — Ambulatory Visit: Payer: Medicare HMO | Admitting: Hematology

## 2022-12-11 ENCOUNTER — Encounter (HOSPITAL_COMMUNITY)
Admission: RE | Admit: 2022-12-11 | Discharge: 2022-12-11 | Disposition: A | Discharge status: Home / Self Care | Payer: Medicare Other | Source: Ambulatory Visit | Attending: Surgery | Admitting: Surgery

## 2022-12-15 ENCOUNTER — Encounter (HOSPITAL_COMMUNITY)
Admission: RE | Disposition: A | Discharge status: Home / Self Care | Payer: Self-pay | Source: Home / Self Care | Attending: Surgery

## 2022-12-15 ENCOUNTER — Other Ambulatory Visit: Payer: Self-pay

## 2022-12-15 ENCOUNTER — Ambulatory Visit (HOSPITAL_BASED_OUTPATIENT_CLINIC_OR_DEPARTMENT_OTHER): Payer: Medicare Other | Admitting: Certified Registered Nurse Anesthetist

## 2022-12-15 ENCOUNTER — Encounter (HOSPITAL_COMMUNITY): Payer: Self-pay | Admitting: Surgery

## 2022-12-15 ENCOUNTER — Ambulatory Visit (HOSPITAL_COMMUNITY): Payer: Medicare Other | Admitting: Certified Registered Nurse Anesthetist

## 2022-12-15 ENCOUNTER — Ambulatory Visit (HOSPITAL_COMMUNITY)
Admission: RE | Admit: 2022-12-15 | Discharge: 2022-12-15 | Disposition: A | Discharge status: Home / Self Care | Payer: Medicare Other | Attending: Surgery | Admitting: Surgery

## 2022-12-15 DIAGNOSIS — I2510 Atherosclerotic heart disease of native coronary artery without angina pectoris: Secondary | ICD-10-CM

## 2022-12-15 DIAGNOSIS — C8285 Other types of follicular lymphoma, lymph nodes of inguinal region and lower limb: Secondary | ICD-10-CM | POA: Insufficient documentation

## 2022-12-15 DIAGNOSIS — I5032 Chronic diastolic (congestive) heart failure: Secondary | ICD-10-CM | POA: Insufficient documentation

## 2022-12-15 DIAGNOSIS — I110 Hypertensive heart disease with heart failure: Secondary | ICD-10-CM | POA: Diagnosis not present

## 2022-12-15 DIAGNOSIS — I252 Old myocardial infarction: Secondary | ICD-10-CM | POA: Insufficient documentation

## 2022-12-15 DIAGNOSIS — N183 Chronic kidney disease, stage 3 unspecified: Secondary | ICD-10-CM | POA: Diagnosis not present

## 2022-12-15 DIAGNOSIS — E1122 Type 2 diabetes mellitus with diabetic chronic kidney disease: Secondary | ICD-10-CM | POA: Diagnosis not present

## 2022-12-15 DIAGNOSIS — R590 Localized enlarged lymph nodes: Secondary | ICD-10-CM | POA: Diagnosis not present

## 2022-12-15 DIAGNOSIS — Z7984 Long term (current) use of oral hypoglycemic drugs: Secondary | ICD-10-CM | POA: Diagnosis not present

## 2022-12-15 DIAGNOSIS — I509 Heart failure, unspecified: Secondary | ICD-10-CM

## 2022-12-15 DIAGNOSIS — E785 Hyperlipidemia, unspecified: Secondary | ICD-10-CM | POA: Diagnosis not present

## 2022-12-15 DIAGNOSIS — I130 Hypertensive heart and chronic kidney disease with heart failure and stage 1 through stage 4 chronic kidney disease, or unspecified chronic kidney disease: Secondary | ICD-10-CM | POA: Diagnosis not present

## 2022-12-15 HISTORY — PX: INGUINAL LYMPH NODE BIOPSY: SHX5865

## 2022-12-15 LAB — GLUCOSE, CAPILLARY
Glucose-Capillary: 205 mg/dL — ABNORMAL HIGH (ref 70–99)
Glucose-Capillary: 149 mg/dL — ABNORMAL HIGH (ref 70–99)

## 2022-12-15 SURGERY — BIOPSY, LYMPH NODE, INGUINAL, OPEN
Anesthesia: General | Site: Inguinal | Laterality: Left

## 2022-12-15 MED ORDER — DOCUSATE SODIUM 100 MG PO CAPS: 100.0000 mg | ORAL_CAPSULE | Freq: Two times a day (BID) | ORAL | 2 refills | Status: DC

## 2022-12-15 MED ORDER — ORAL CARE MOUTH RINSE: 15.0000 mL | Freq: Once | OROMUCOSAL | Status: AC

## 2022-12-15 MED ORDER — LIDOCAINE HCL (PF) 2 % IJ SOLN
INTRAMUSCULAR | Status: AC
  Filled 2022-12-15: qty 5

## 2022-12-15 MED ORDER — ROCURONIUM BROMIDE 10 MG/ML (PF) SYRINGE
PREFILLED_SYRINGE | INTRAVENOUS | Status: AC
  Filled 2022-12-15: qty 10

## 2022-12-15 MED ORDER — OXYCODONE HCL 5 MG PO TABS: 5.0000 mg | ORAL_TABLET | Freq: Four times a day (QID) | ORAL | 0 refills | Status: DC | PRN

## 2022-12-15 MED ORDER — ACETAMINOPHEN 500 MG PO TABS: 1000.0000 mg | ORAL_TABLET | Freq: Four times a day (QID) | ORAL | 0 refills | Status: AC

## 2022-12-15 MED ORDER — OXYCODONE HCL 5 MG/5ML PO SOLN: 5.0000 mg | Freq: Once | ORAL | Status: DC | PRN

## 2022-12-15 MED ORDER — CEFAZOLIN SODIUM-DEXTROSE 2-4 GM/100ML-% IV SOLN
2.0000 g | INTRAVENOUS | Status: AC
  Administered 2022-12-15: 2 g via INTRAVENOUS

## 2022-12-15 MED ORDER — CEFAZOLIN SODIUM-DEXTROSE 2-4 GM/100ML-% IV SOLN
INTRAVENOUS | Status: AC
  Filled 2022-12-15: qty 100

## 2022-12-15 MED ORDER — ONDANSETRON HCL 4 MG/2ML IJ SOLN
INTRAMUSCULAR | Status: DC | PRN
  Administered 2022-12-15: 4 mg via INTRAVENOUS

## 2022-12-15 MED ORDER — PROPOFOL 10 MG/ML IV BOLUS
INTRAVENOUS | Status: AC
  Filled 2022-12-15: qty 20

## 2022-12-15 MED ORDER — APIXABAN 5 MG PO TABS: 5.0000 mg | ORAL_TABLET | Freq: Two times a day (BID) | ORAL | 11 refills | Status: DC

## 2022-12-15 MED ORDER — OXYCODONE HCL 5 MG PO TABS: 5.0000 mg | ORAL_TABLET | Freq: Once | ORAL | Status: DC | PRN

## 2022-12-15 MED ORDER — BUPIVACAINE HCL (PF) 0.5 % IJ SOLN
INTRAMUSCULAR | Status: AC
  Filled 2022-12-15: qty 30

## 2022-12-15 MED ORDER — CHLORHEXIDINE GLUCONATE CLOTH 2 % EX PADS: 6.0000 | MEDICATED_PAD | Freq: Once | CUTANEOUS | Status: DC

## 2022-12-15 MED ORDER — ONDANSETRON HCL 4 MG/2ML IJ SOLN: 4.0000 mg | Freq: Once | INTRAMUSCULAR | Status: DC | PRN

## 2022-12-15 MED ORDER — CHLORHEXIDINE GLUCONATE 0.12 % MT SOLN
15.0000 mL | Freq: Once | OROMUCOSAL | Status: AC
  Administered 2022-12-15: 15 mL via OROMUCOSAL

## 2022-12-15 MED ORDER — PROPOFOL 500 MG/50ML IV EMUL
INTRAVENOUS | Status: DC | PRN
  Administered 2022-12-15: 50 ug/kg/min via INTRAVENOUS

## 2022-12-15 MED ORDER — DEXAMETHASONE SODIUM PHOSPHATE 10 MG/ML IJ SOLN
INTRAMUSCULAR | Status: AC
  Filled 2022-12-15: qty 1

## 2022-12-15 MED ORDER — PROMETHAZINE HCL 25 MG/ML IJ SOLN
INTRAMUSCULAR | Status: AC
  Filled 2022-12-15: qty 1

## 2022-12-15 MED ORDER — ONDANSETRON HCL 4 MG/2ML IJ SOLN
INTRAMUSCULAR | Status: AC
  Filled 2022-12-15: qty 2

## 2022-12-15 MED ORDER — 0.9 % SODIUM CHLORIDE (POUR BTL) OPTIME
TOPICAL | Status: DC | PRN
  Administered 2022-12-15: 1000 mL

## 2022-12-15 MED ORDER — BUPIVACAINE HCL (PF) 0.5 % IJ SOLN
INTRAMUSCULAR | Status: DC | PRN
  Administered 2022-12-15: 30 mL

## 2022-12-15 MED ORDER — LACTATED RINGERS IV SOLN: INTRAVENOUS | Status: DC

## 2022-12-15 MED ORDER — FENTANYL CITRATE PF 50 MCG/ML IJ SOSY: 25.0000 ug | PREFILLED_SYRINGE | INTRAMUSCULAR | Status: DC | PRN

## 2022-12-15 MED ORDER — FENTANYL CITRATE (PF) 100 MCG/2ML IJ SOLN
INTRAMUSCULAR | Status: AC
  Filled 2022-12-15: qty 2

## 2022-12-15 MED ORDER — PROPOFOL 500 MG/50ML IV EMUL
INTRAVENOUS | Status: AC
  Filled 2022-12-15: qty 50

## 2022-12-15 MED ORDER — PROPOFOL 10 MG/ML IV BOLUS
INTRAVENOUS | Status: DC | PRN
  Administered 2022-12-15: 50 mg via INTRAVENOUS
  Administered 2022-12-15: 20 mg via INTRAVENOUS

## 2022-12-15 MED ORDER — PHENYLEPHRINE 80 MCG/ML (10ML) SYRINGE FOR IV PUSH (FOR BLOOD PRESSURE SUPPORT)
PREFILLED_SYRINGE | INTRAVENOUS | Status: AC
  Filled 2022-12-15: qty 10

## 2022-12-15 MED ORDER — FENTANYL CITRATE (PF) 100 MCG/2ML IJ SOLN
INTRAMUSCULAR | Status: DC | PRN
  Administered 2022-12-15: 25 ug via INTRAVENOUS

## 2022-12-15 SURGICAL SUPPLY — 27 items
ADH SKN CLS APL DERMABOND .7 (GAUZE/BANDAGES/DRESSINGS) ×1
CLOTH BEACON ORANGE TIMEOUT ST (SAFETY) ×1 IMPLANT
CNTNR URN SCR LID CUP LEK RST (MISCELLANEOUS) ×1 IMPLANT
CONT SPEC 4OZ STRL OR WHT (MISCELLANEOUS) ×1
COVER LIGHT HANDLE STERIS (MISCELLANEOUS) ×2 IMPLANT
DECANTER SPIKE VIAL GLASS SM (MISCELLANEOUS) ×1 IMPLANT
DERMABOND ADVANCED .7 DNX12 (GAUZE/BANDAGES/DRESSINGS) ×1 IMPLANT
ELECT REM PT RETURN 9FT ADLT (ELECTROSURGICAL) ×1
ELECTRODE REM PT RTRN 9FT ADLT (ELECTROSURGICAL) ×1 IMPLANT
GAUZE SPONGE 4X4 12PLY STRL (GAUZE/BANDAGES/DRESSINGS) IMPLANT
GLOVE BIOGEL PI IND STRL 6.5 (GLOVE) ×1 IMPLANT
GLOVE BIOGEL PI IND STRL 7.0 (GLOVE) ×2 IMPLANT
GLOVE SURG SS PI 6.5 STRL IVOR (GLOVE) ×1 IMPLANT
GLOVE SURG SS PI 7.0 STRL IVOR (GLOVE) IMPLANT
GOWN STRL REUS W/TWL LRG LVL3 (GOWN DISPOSABLE) ×2 IMPLANT
KIT TURNOVER KIT A (KITS) ×1 IMPLANT
MANIFOLD NEPTUNE II (INSTRUMENTS) ×1 IMPLANT
NDL HYPO 25X1 1.5 SAFETY (NEEDLE) ×1 IMPLANT
NEEDLE HYPO 25X1 1.5 SAFETY (NEEDLE) ×1 IMPLANT
NS IRRIG 1000ML POUR BTL (IV SOLUTION) ×1 IMPLANT
PACK MINOR (CUSTOM PROCEDURE TRAY) ×1 IMPLANT
PAD ARMBOARD 7.5X6 YLW CONV (MISCELLANEOUS) ×1 IMPLANT
SET BASIN LINEN APH (SET/KITS/TRAYS/PACK) ×1 IMPLANT
SUT MNCRL AB 4-0 PS2 18 (SUTURE) ×1 IMPLANT
SUT VIC AB 3-0 SH 27 (SUTURE) ×1
SUT VIC AB 3-0 SH 27X BRD (SUTURE) ×1 IMPLANT
SYR CONTROL 10ML LL (SYRINGE) ×1 IMPLANT

## 2022-12-15 NOTE — Anesthesia Procedure Notes (Signed)
Procedure Name: General with mask airway Date/Time: 12/15/2022 11:10 AM  Performed by: Cy Blamer, CRNAPre-anesthesia Checklist: Patient identified, Emergency Drugs available, Suction available, Patient being monitored and Timeout performed Patient Re-evaluated:Patient Re-evaluated prior to induction Oxygen Delivery Method: Simple face mask Preoxygenation: Pre-oxygenation with 100% oxygen Induction Type: IV induction Placement Confirmation: positive ETCO2 Dental Injury: Teeth and Oropharynx as per pre-operative assessment

## 2022-12-15 NOTE — Interval H&P Note (Signed)
History and Physical Interval Note:  12/15/2022 11:04 AM  Alejandro [name]  has presented today for surgery, with the diagnosis of INGUINAL LYMPHADEONAPATHY.  The various methods of treatment have been discussed with the patient and family. After consideration of risks, benefits and other options for treatment, the patient has consented to  Procedure(s): INGUINAL LYMPH NODE BIOPSY (Left) as a surgical intervention.  The patient's history has been reviewed, patient examined, no change in status, stable for surgery.  I have reviewed the patient's chart and labs.  Questions were answered to the patient's satisfaction.     Dannae Kato A Jorryn Hershberger

## 2022-12-15 NOTE — Anesthesia Preprocedure Evaluation (Signed)
Anesthesia Evaluation  Patient identified by MRN, date of birth, ID band Patient awake    Reviewed: Allergy & Precautions, H&P , NPO status , Patient's Chart, lab work & pertinent test results, reviewed documented beta blocker date and time   Airway Mallampati: II  TM Distance: >3 FB Neck ROM: full    Dental no notable dental hx.    Pulmonary neg pulmonary ROS   Pulmonary exam normal breath sounds clear to auscultation       Cardiovascular Exercise Tolerance: Good hypertension, + CAD, + Past MI, +CHF and + DOE  + Valvular Problems/Murmurs AS  Rhythm:regular Rate:Normal     Neuro/Psych  Headaches negative neurological ROS  negative psych ROS   GI/Hepatic negative GI ROS, Neg liver ROS,GERD  ,,  Endo/Other  negative endocrine ROSdiabetes    Renal/GU Renal diseasenegative Renal ROS  negative genitourinary   Musculoskeletal   Abdominal   Peds  Hematology negative hematology ROS (+)   Anesthesia Other Findings 1. Left ventricular ejection fraction, by estimation, is 50 to 55%. The  left ventricle has low normal function. The left ventricle has no regional  wall motion abnormalities. The left ventricular internal cavity size was  mildly dilated. There is abnormal   septal motion. Recommend obtaining Limited Echo with global longitudinal  strain.   2. Right ventricular systolic function is low normal. The right  ventricular size is normal.   3. The mitral valve is degenerative. Trivial mitral valve regurgitation.  No evidence of mitral stenosis.   4. The aortic valve is calcified. Aortic valve regurgitation is trivial.  Moderate aortic valve stenosis.   5. There is moderate dilatation of the aortic root, measuring 44 mm.   6. The inferior vena cava is normal in size with greater than 50%  respiratory variability, suggesting right atrial pressure of 3 mmHg.    Reproductive/Obstetrics negative OB ROS                              Anesthesia Physical Anesthesia Plan  ASA: 3  Anesthesia Plan: General   Post-op Pain Management:    Induction:   PONV Risk Score and Plan: Propofol infusion  Airway Management Planned:   Additional Equipment:   Intra-op Plan:   Post-operative Plan:   Informed Consent: I have reviewed the patients History and Physical, chart, labs and discussed the procedure including the risks, benefits and alternatives for the proposed anesthesia with the patient or authorized representative who has indicated his/her understanding and acceptance.     Dental Advisory Given  Plan Discussed with: CRNA  Anesthesia Plan Comments:        Anesthesia Quick Evaluation

## 2022-12-15 NOTE — Discharge Instructions (Signed)
Ambulatory Surgery Discharge Instructions  General Anesthesia or Sedation Do not drive or operate heavy machinery for 24 hours.  Do not consume alcohol, tranquilizers, sleeping medications, or any non-prescribed medications for 24 hours. Do not make important decisions or sign any important papers in the next 24 hours. You should have someone with you tonight at home.  Activity  You are advised to go directly home from the hospital.  Restrict your activities and rest for a day.  Resume light activity tomorrow. No heavy lifting over 10 lbs or strenuous exercise.  Fluids and Diet Begin with clear liquids, bouillon, dry toast, soda crackers.  If not nauseated, you may go to a regular diet when you desire.  Greasy and spicy foods are not advised.  Medications  If you have not had a bowel movement in 24 hours, take 2 tablespoons over the counter Milk of mag.             You May resume your blood thinners tomorrow (Aspirin, coumadin, or other).  You are being discharged with prescriptions for Opioid/Narcotic Medications: There are some specific considerations for these medications that you should know. Opioid Meds have risks & benefits. Addiction to these meds is always a concern with prolonged use Take medication only as directed Do not drive while taking narcotic pain medication Do not crush tablets or capsules Do not use a different container than medication was dispensed in Lock the container of medication in a cool, dry place out of reach of children and pets. Opioid medication can cause addiction Do not share with anyone else (this is a felony) Do not store medications for future use. Dispose of them properly.     Disposal:  Find a [facility] household drug take back site near you.  If you can't get to a drug take back site, use the recipe below as a last resort to dispose of expired, unused or unwanted drugs. Disposal  (Do not dispose chemotherapy drugs this way, talk to your  prescribing doctor instead.) Step 1: Mix drugs (do not crush) with dirt, kitty litter, or used coffee grounds and add a small amount of water to dissolve any solid medications. Step 2: Seal drugs in plastic bag. Step 3: Place plastic bag in trash. Step 4: Take prescription container and scratch out personal information, then recycle or throw away.  Operative Site  You have a liquid bandage over your incisions, this will begin to flake off in about a week. Ok to shower tomorrow. Keep wound clean and dry. No baths or swimming. No lifting more than 10 pounds.  Contact Information: If you have questions or concerns, please call our office, [phone], Monday- Thursday 8AM-5PM and Friday 8AM-12Noon.  If it is after hours or on the weekend, please call [facility] Main Number, [phone], and ask to speak to the surgeon on call for Dr. Cythia Bachtel at [place].   SPECIFIC COMPLICATIONS TO WATCH FOR: Inability to urinate Fever over 101? F by mouth Nausea and vomiting lasting longer than 24 hours. Pain not relieved by medication ordered Swelling around the operative site Increased redness, warmth, hardness, around operative area Numbness, tingling, or cold fingers or toes Blood -soaked dressing, (small amounts of oozing may be normal) Increasing and progressive drainage from surgical area or exam site  

## 2022-12-15 NOTE — Progress Notes (Signed)
Pt states that he does not take eliquis. Cardiac monitor displays A-fib with occasional PVC's. Pt brought sample of meds he takes from [facility]. Eliquis was not included. Pt requested that I call [facility] and check on his eliquis. [facility] contacted and no eliquis  listed on his profile. I contacted [facility] and eliquis is listed on his profile, but has not been filled since September 2023. Dr Robyne Peers notified. She stated that she would send a note to Roe Rutherford NP, PCP, and inform her of the situation so he could be restarted on his eliquis.

## 2022-12-15 NOTE — Transfer of Care (Signed)
Immediate Anesthesia Transfer of Care Note  Patient: Alejandro [name]  Procedure(s) Performed: INGUINAL LYMPH NODE BIOPSY (Left: Inguinal)  Patient Location: PACU  Anesthesia Type:General  Level of Consciousness: awake, alert , and oriented  Airway & Oxygen Therapy: Patient Spontanous Breathing  Post-op Assessment: Report given to RN, Post -op Vital signs reviewed and stable, Patient moving all extremities X 4, and Patient able to stick tongue midline  Post vital signs: Reviewed  Last Vitals:  Vitals Value Taken Time  BP 120/67 12/15/22 1211  Temp 97.6   Pulse 78 12/15/22 1213  Resp 16 12/15/22 1213  SpO2 98 % 12/15/22 1213  Vitals shown include unvalidated device data.  Last Pain:  Vitals:   12/15/22 0915  PainSc: 7          Complications: No notable events documented.

## 2022-12-15 NOTE — Op Note (Signed)
[facility] Operative Note  12/15/22  Preoperative Diagnosis: Left inguinal lymphadenopathy   Postoperative Diagnosis: Same   Procedure(s) Performed: Excisional biopsy of left inguinal lymph node   Surgeon: Theophilus Kinds, DO    Assistants: No qualified resident was available    Anesthesia: General anesthesia with mask airway   Anesthesiologist: Windell Norfolk, MD    Specimens: Left inguinal lymph node   Estimated Blood Loss: Minimal   Blood Replacement: None    Complications: None   Wound Class: Clean   Operative Indications: Patient is a 82 year old male who presents for open left inguinal lymph node biopsy.  He has had diffuse chest and intra-abdominal lymphadenopathy with concern for lymphoma.  He has had multiple core needle lymph node biopsies without definitive diagnosis being able to be determined.  He is agreeable to surgery at this time.  All risks and benefits of performing this procedure were discussed with the patient including pain, infection, bleeding, damage to the surrounding structures, and need for more procedures or surgery. The patient voiced understanding of the procedure, all questions were sought and answered, and consent was obtained.  Findings: Enlarged left inguinal lymph node   Procedure: The patient was taken to the operating room and placed supine. General anesthesia with mask airway was induced. Intravenous antibiotics were administered per protocol. The left groin was prepared and draped in the usual sterile fashion. Time out was performed.  A linear incision was made overlying the left inguinal lymph node. Using electrocautery, the dermis and subcutaneous tissues dissected around the lymph node. The lymph node was removed in its entirety and sent fresh to pathology for evaluation. Hemostasis was achieved using electrocautery. Overlying scarpa's fascia was closed with 3-0 Vicryl in an interrupted fashion. The dermis was  approximated using 3-0 Vicryl sutures in an interrupted fashion. The skin was closed using a running 4-0 Monocryl subcuticular stitch.  Dermabond was applied.  Final inspection revealed acceptable hemostasis. All counts were correct at the end of the case. The patient was awakened from anesthesia without complication.  The patient went to the PACU in stable condition.   Theophilus Kinds, DO  [facility] [address] [place], [place] [address] [phone] (office)

## 2022-12-15 NOTE — Progress Notes (Signed)
[facility]  Spoke with the patient's friend, Vivien Presto, on the phone.  I explained that he tolerated the procedure without difficulty.  He has dissolvable stitches under the skin with overlying skin glue.  This will flake off in 10 to 14 days.  I discharged him home with a prescription for narcotic pain medication that they should take as needed for pain.  I also want him taking scheduled Tylenol.  If they take the narcotic pain medication, they should take a stool softener as well.  The patient will follow-up with me in 2 weeks for phone follow-up.  I will also send a message to his primary care doctor and his cardiologist, as he supposed to be taking Eliquis, but he currently does not have a prescription for this medication.  All questions were answered to his expressed satisfaction.  Theophilus Kinds, DO [facility] [address] [place], [place] [address] [phone] (office)

## 2022-12-16 NOTE — Anesthesia Postprocedure Evaluation (Signed)
Anesthesia Post Note  Patient: Alejandro [name]  Procedure(s) Performed: INGUINAL LYMPH NODE BIOPSY (Left: Inguinal)  Patient location during evaluation: Phase II Anesthesia Type: General Level of consciousness: awake Pain management: pain level controlled Vital Signs Assessment: post-procedure vital signs reviewed and stable Respiratory status: spontaneous breathing and respiratory function stable Cardiovascular status: blood pressure returned to baseline and stable Postop Assessment: no headache and no apparent nausea or vomiting Anesthetic complications: no Comments: Late entry   No notable events documented.   Last Vitals:  Vitals:   12/15/22 1247 12/15/22 1248  BP:  134/74  Pulse:    Resp:    Temp: 36.4 C   SpO2: 99%     Last Pain:  Vitals:   12/15/22 1248  TempSrc:   PainSc: 0-No pain                 Windell Norfolk

## 2022-12-17 NOTE — Progress Notes (Deleted)
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  The patient works in [employer] and works outside on a daily basis.  Apparently, the patient was noted to have some gait instability and altered mental status on 07/11/2021.  His grandson took him home, and the patient went to take a nap.  Apparently, the patient continued to have some dizziness and gait instability.  He had an episode of diaphoresis when he woke up.  As result, the patient was brought to the hospital for further evaluation.  There was concern initially that the patient had not been hydrating well.  The patient did have 2 episodes of nausea and vomiting on the day of admission.  In the emergency department, the patient had SIRS criteria with a temperature of 94.8 F, heart rate in the 40s, WBC 14.2, and lactate 2.0.  Patient was admitted for further evaluation and treatment.  He was started on intravenous antibiotics and IV fluids.  Since then, his mental status has gradually improved.  His vital signs have stabilized.   Discharge Diagnoses:    SIRS -Present on admission -Sepsis ruled out -Presented with hypothermia and leukocytosis with lactate 2.0 -Physiologically improved -Blood cultures remain negative -UA negative for pyuria -Chest x-ray without any infiltrates -Discontinue antibiotics and observe clinically -Lactic acid 2.0>>1.7 -PCT <0.10 x 3   Acute  metabolic encephalopathy -Mental status has improved with hydration and empiric antibiotics -Oral intake has improved -Discontinue IV fluids and IV antibiotics and observe clinically -Serum B12--61>>>given IM inj and start po supplement -Alejandro [name]   Chronic systolic and diastolic CHF -Clinically euvolemic -01/11/2020 echo EF 45%, grade 3 DD, moderate MR, mild AS -Continue aspirin, metoprolol, Lipitor   Coronary artery disease status post CABG -No chest pain presently -Continue aspirin and Lipitor -Continue metoprolol tartrate   Lactic acidosis -Sepsis ruled out   Abnormal EKG -Patient presented with junctional rhythm and RBBB -Improved after electrolyte abnormalities and metabolic abnormalities corrected -07/12/2021 EKG--personally reviewed sinus rhythm, nonspecific T wave changes -remained on telemetry without concerning dysrhythmia   Essential hypertension -Continue metoprolol tartrate -Restart hydralazine -Continue lisinopril>>increase to 10 mg daily -Holding HCTZ>>resume after d/c   Uncontrolled diabetes mellitus type 2 with hyperglycemia -07/12/2021 hemoglobin A1c 8.3 -Holding metformin and Jardiance >>resume after d/c -Continue NovoLog sliding scale   Anxiety -His benzodiazepines were held in the setting of his altered mental status at the time of admission   History of Present Illness  09/10/2021  Pulmonary/ 1st office eval/Alejandro [name]  Chief Complaint  Patient presents with   Consult    Abnormal chest ct. Fatigue, no SOB. Persistent dry cough.   Dyspnea:  progressively worse x 6 m but limited by dizzy /sob about the same time  Cough: dry  raspy cough  x about the same time Sleep: fine 2 pillows  SABA use: none   Rec Stop lisinopril  and start valsartan 80 mg one daily in it's place and  the voice should improve and the cough should go away over several weeks  Recommend paced walking and keep track of your 02 saturation at peak exertion (not after stopping)  -once a  week is enough to see if trending down (above 90% is the goal)  We will walk you slowly around the office today for a baseline to compare to next visit - Be sure to bring your cane     04/29/2022  f/u ov/Alejandro [name] office/Alejandro [name] re: uacs ? maint on gerd rx / very confused with instructions/ did not bring cane   Chief Complaint  Patient presents with   Follow-up    Believes he has sinus infection.   Dyspnea:  walking to shed  Cough: resolved Nasal obstruction/ seems better as day goes on but face / head feel like busting out x one month Sleeping: bed is flat / on side, 2 pillows  SABA use: ? Helps / only uses p exertion 02: none  Covid status: vax x 3  Rec Pantoprazole (protonix) 40 mg   Take  30-60 min before first meal of the day and Pepcid (famotidine)  20 mg after supper until return to office  Please schedule a follow up office visit in 6 weeks, call sooner if needed with all medications /inhalers/ solutions in hand     11/10/2022  f/u ov/Alejandro [name] re: doe maint on no resp rx/ did not bring meds   Chief Complaint  Patient presents with   Follow-up    Congestion in lungs   Dyspnea:  walking to shed = 300 ft slt elevation coming back to house and stops to recover at landing Cough: none / nasal congestion rx flonase  Sleeping: bed is flat with 2 pillows  SABA use: 4 x daily - last used 2 h prior  02: not using at all all   Rec Prednisone 10 mg take  4 each am x 2 days,   2 each am x 2 days,  1 each am x 2 days and stop  Call if prednisone really helps and we can try symbicort 80   Work on inhaler technique:  Only use your albuterol as a rescue medication  Also  Ok to try albuterol 15 min before an activity (on alternating days)  that you know would usually make you short of breath     12/18/2022  f/u ov/Alejandro [name] office/Alejandro [name] re: *** maint on ***  No chief complaint on file.   Dyspnea:  *** Cough: *** Sleeping: *** SABA use: *** 02: *** Covid status: *** Lung cancer  screening: ***   No obvious day to day or daytime variability or assoc excess/ purulent sputum or mucus plugs or hemoptysis or cp or chest tightness, subjective wheeze or overt sinus or hb symptoms.   *** without nocturnal  or early am exacerbation  of respiratory  c/o's or need for noct saba. Also denies any obvious fluctuation of symptoms with weather or environmental changes or other aggravating or alleviating factors except as outlined above   No unusual exposure hx or h/o childhood pna/ asthma or knowledge of premature birth.  Current Allergies, Complete Past Medical History, Past Surgical History, Family History, and Social History were reviewed in [employer] record.  ROS  The following are not active complaints unless bolded Hoarseness, sore throat, dysphagia, dental problems, itching, sneezing,  nasal congestion or discharge of excess mucus or purulent secretions, ear ache,   fever, chills, sweats, unintended wt loss or wt gain, classically  pleuritic or exertional cp,  orthopnea pnd or arm/hand swelling  or leg swelling, presyncope, palpitations, abdominal pain, anorexia, nausea, vomiting, diarrhea  or change in bowel habits or change in bladder habits, change in stools or change in urine, dysuria, hematuria,  rash, arthralgias, visual complaints, headache, numbness, weakness or ataxia or problems with walking or coordination,  change in mood or  memory.        No outpatient medications have been marked as taking for the 12/18/22 encounter (Appointment) with Nyoka Cowden, MD.          Past Medical History:  Diagnosis Date   Aortic insufficiency    a. mild-mod by intraop TEE 05/2018 (no aortic stenosis).   CAD (coronary artery disease)    a. NSTEMI 05/2018 with multivessel disease -> s/p CABGx4 05/11/18.   Chronic diastolic CHF (congestive heart failure) (HCC)    a. dx 05/2018.   CKD (chronic kidney disease), stage III (HCC)    History of nephrolithiasis 2004    Hyperlipidemia    Hypertension    IBS (irritable colon syndrome)    Postoperative atrial fibrillation (HCC)    PVC's (premature ventricular contractions)    Reflux    Type 2 diabetes mellitus (HCC)        Objective:    Wts  12/18/2022        ***  11/10/2022     236  04/29/2022       231   10/28/21 230 lb 1.9 oz (104.4 kg)  09/10/21 230 lb 1.9 oz (104.4 kg)  07/22/21 231 lb (104.8 kg)    Vital signs reviewed  12/18/2022  - Note at rest 02 sats  ***% on ***   General appearance:    ***    STS L neck (ent eval in progress per pt) /prominent upper airway wheezes  insp crackles bases  bilaterally   2-3/6 SEM  ***      Assessment

## 2022-12-18 ENCOUNTER — Other Ambulatory Visit: Payer: Self-pay | Admitting: *Deleted

## 2022-12-18 ENCOUNTER — Ambulatory Visit: Payer: Medicare Other | Attending: Medical | Admitting: Medical

## 2022-12-18 ENCOUNTER — Encounter: Payer: Self-pay | Admitting: Medical

## 2022-12-18 ENCOUNTER — Ambulatory Visit: Payer: Medicare HMO | Admitting: Internal Medicine

## 2022-12-18 VITALS — BP 139/67 | HR 72 | Ht 71.0 in | Wt 240.0 lb

## 2022-12-18 DIAGNOSIS — I4819 Other persistent atrial fibrillation: Secondary | ICD-10-CM

## 2022-12-18 DIAGNOSIS — I10 Essential (primary) hypertension: Secondary | ICD-10-CM

## 2022-12-18 DIAGNOSIS — I2510 Atherosclerotic heart disease of native coronary artery without angina pectoris: Secondary | ICD-10-CM

## 2022-12-18 DIAGNOSIS — I350 Nonrheumatic aortic (valve) stenosis: Secondary | ICD-10-CM | POA: Diagnosis not present

## 2022-12-18 MED ORDER — APIXABAN 5 MG PO TABS: 5.0000 mg | ORAL_TABLET | Freq: Two times a day (BID) | ORAL | 5 refills | Status: DC

## 2022-12-18 MED ORDER — SPIRONOLACTONE 25 MG PO TABS: 12.5000 mg | ORAL_TABLET | Freq: Every day | ORAL | 3 refills | Status: DC

## 2022-12-18 NOTE — Progress Notes (Signed)
Cardiology Office Note:    Date:  12/18/2022   ID:  OTHA MONICAL, DOB [birth year], MRN [id number]  PCP:  Pablo Lawrence, NP  [facility] Cardiologist:  Rozann Lesches, MD  [place] Electrophysiologist:  None   Referring MD: Pablo Lawrence, NP   Chief Complaint: 5-6 month follow-up  History of Present Illness:    DETRICH RAKESTRAW is a 82 y.o. male with a hx of CAD status post CABG in 05/2018, hypertension, hyperlipidemia, aortic stenosis, paroxysmal A-fib on Eliquis, Lyme disease, and CKD stage III who presents for follow-up.  Patient was examined in February 2023 for bradycardia.  Lopressor was reduced at that time.  At follow-up she reported shortness of breath but no chest pain. Lexiscan Myoview showed evidence of scar but no current ischemia.  It was read as intermediate risk given reduced EF of 39%.  Limited echo was recommended which showed EF was preserved at 55 to 60%, mild aortic valve stenosis but no significant valve abnormalities.  Echo showed mild dilation of the aortic root to 41 mm.  Heart monitor showed sinus rhythm, but he noted to have frequent PACs representing 70% total beats along with PVCs representing less than 1% of total beats.  He also had brief PSVT with the longest being 13 beats, but was also noted to have intermittent paroxysmal A-fib with a 1% burden.  He was started on Eliquis 5 mg daily for anticoagulation and referred to EP.  Patient saw Dr. Lovena Le in August 2023 for A-fib and history of sinus node dysfunction.  Patient was off all AV nodal blocking drugs.  It was not felt patient needed a pacemaker.  After seeing Dr. Lovena Le, patient was seen at [facility] cardiology for afib and underwent a cardioversion and was started on Tikosyn.   Patient is currently undergoing workup for diffuse lymphadenopathy.  Pathology came back suspicious for B-cell lymphoma.  He has been following with oncology and primary care.  Today, the patient reports he is off  Eliquis, he is unsure how this happened. It is possible that he had a biopsy and Eliquis was not restarted postprocedure. He is also not on spironolactone, unsure if he needs to be on this. Patient denies chest pain, SOB, lower leg edema. Patient reports is daytime sleepiness. HE dies not want OSA testing. He reports lung cancer with home O2 at all times.    Past Medical History:  Diagnosis Date   A-fib [facility])    AAA Ultrasound 01/11/2020    AAA [place] 01/11/20:  no abdominal aortic aneurysm, 2.4 cm   Aortic insufficiency    a. mild-mod by intraop TEE 05/2018 (no aortic stenosis) // Echo 2/21: mild //  Echo 9/22: trivial   Aortic stenosis    Echo 9/22: mild (mean 16) // Echo 2/21: mild (mean 18.7)   CAD (coronary artery disease)    a. NSTEMI 05/2018 with multivessel disease -> s/p CABGx4 05/11/18 // Myoview 06/07/20: inf scar w mild peri-infarct ischemia, EF 37; Int Risk   Carotid artery disease ([place])    PreCABG [place] 05/07/18: Bilat ICA 1-39   CKD (chronic kidney disease), stage III ([place])    HFimpEF (heart failure with improved EF)    Echocardiogram 01/11/20: EF 45, Gr 3 DD, mild AI, mild AS (mean 18.7 mmHg) // Echocardiogram 08/14/21:  EF 55-60, inf HK, mild LVH, Gr 1 DD, normal RVSF, RVSP 25.3, mild LAE, trivial MR, MAC, trivial AI, mild AS (mean 16 mmHg, Vmax 283 cm/s, DI 0.41)  History of nephrolithiasis 2004   Hyperlipidemia    Hypertension    IBS (irritable colon syndrome)    Ischemic cardiomyopathy    Mitral regurgitation    Echo 9/22: trivial MR   Postoperative atrial fibrillation (HCC)    PVC's (premature ventricular contractions)    Reflux    Type 2 diabetes mellitus ([place])     Past Surgical History:  Procedure Laterality Date   BRONCHIAL NEEDLE ASPIRATION BIOPSY  11/17/2022   Procedure: BRONCHIAL NEEDLE ASPIRATION BIOPSIES;  Surgeon: Collene Gobble, MD;  Location: [place];  Service: Cardiopulmonary;;   CARDIOVERSION     CORONARY ARTERY BYPASS GRAFT N/A 05/11/2018   Procedure:  CORONARY ARTERY BYPASS GRAFTING (CABG), on pump, times four, using left internal mammary artery and endoscopically harvested right greater saphenous leg vein.;  Surgeon: Ivin Poot, MD;  Location: [place];  Service: Open Heart Surgery;  Laterality: N/A;   ESOPHAGOGASTRODUODENOSCOPY  02/04/2012   Procedure: ESOPHAGOGASTRODUODENOSCOPY (EGD);  Surgeon: Rogene Houston, MD;  [facility];  Service: Endoscopy;  Laterality: N/A;  100   LEFT HEART CATH AND CORONARY ANGIOGRAPHY N/A 05/04/2018   Procedure: LEFT HEART CATH AND CORONARY ANGIOGRAPHY;  Surgeon: Belva Crome, MD;  Location: [place] [facility];  Service: Cardiovascular;  Laterality: N/A;   NASAL FRACTURE SURGERY     TEE WITHOUT CARDIOVERSION N/A 05/11/2018   Procedure: TRANSESOPHAGEAL ECHOCARDIOGRAM (TEE);  Surgeon: Prescott Gum, Collier Salina, MD;  Location: [place];  Service: Open Heart Surgery;  Laterality: N/A;   VIDEO BRONCHOSCOPY WITH ENDOBRONCHIAL ULTRASOUND Right 11/17/2022   Procedure: VIDEO BRONCHOSCOPY WITH ENDOBRONCHIAL ULTRASOUND;  Surgeon: Collene Gobble, MD;  Location: [place];  Service: Cardiopulmonary;  Laterality: Right;    Current Medications: Current Meds  Medication Sig   acetaminophen (TYLENOL) 500 MG tablet Take 2 tablets (1,000 mg total) by mouth every 6 (six) hours for 7 days.   albuterol (VENTOLIN HFA) 108 (90 Base) MCG/ACT inhaler Inhale 2 puffs into the lungs every 6 (six) hours as needed for wheezing or shortness of breath.   atorvastatin (LIPITOR) 40 MG tablet Take 40 mg by mouth at bedtime.   cyanocobalamin (VITAMIN B12) 1000 MCG tablet Take 1,000 mcg by mouth daily.   cyanocobalamin (VITAMIN B12) 1000 MCG/ML injection Inject 1,000 mcg into the skin every 30 (thirty) days.   docusate sodium (COLACE) 100 MG capsule Take 1 capsule (100 mg total) by mouth 2 (two) times daily.   dofetilide (TIKOSYN) 500 MCG capsule Take 500 mcg by mouth 2 (two) times daily.   famotidine (PEPCID) 20 MG tablet Take 1 tablet  (20 mg total) by mouth 2 (two) times daily.   fluticasone (FLONASE) 50 MCG/ACT nasal spray Place 1 spray into both nostrils daily.   glipiZIDE (GLUCOTROL XL) 10 MG 24 hr tablet Take 10 mg by mouth daily.   hydrochlorothiazide (HYDRODIURIL) 25 MG tablet Take 25 mg by mouth daily.   ibuprofen (ADVIL) 200 MG tablet Take 400-600 mg by mouth every 6 (six) hours as needed for moderate pain.   loratadine (CLARITIN) 10 MG tablet Take 10 mg by mouth daily.   Melatonin 10 MG CAPS Take 30 mg by mouth at bedtime as needed (sleep).   metFORMIN (GLUCOPHAGE-XR) 750 MG 24 hr tablet Take 750 mg by mouth 2 (two) times daily with a meal.   oxyCODONE (ROXICODONE) 5 MG immediate release tablet Take 1 tablet (5 mg total) by mouth every 6 (six) hours as needed.   pantoprazole (PROTONIX) 40 MG tablet  Take 40 mg by mouth daily.   polyethylene glycol (MIRALAX / GLYCOLAX) 17 g packet Take 17 g by mouth daily. (Patient taking differently: Take 17 g by mouth daily as needed for moderate constipation.)   potassium chloride (MICRO-K) 10 MEQ CR capsule Take 10 mEq by mouth daily.   Povidone, PF, (IVIZIA DRY EYES) 0.5 % SOLN Place 1 drop into both eyes daily as needed (dry eyes).   spironolactone (ALDACTONE) 25 MG tablet Take 0.5 tablets (12.5 mg total) by mouth daily.   valsartan (DIOVAN) 40 MG tablet Take 1 tablet (40 mg total) by mouth daily.   [DISCONTINUED] spironolactone (ALDACTONE) 25 MG tablet Take 25 mg by mouth daily.     Allergies:   Patient has no known allergies.   Social History   Socioeconomic History   Marital status: Widowed    Spouse name: Not on file   Number of children: Not on file   Years of education: Not on file   Highest education level: Not on file  Occupational History   Not on file  Tobacco Use   Smoking status: Never   Smokeless tobacco: Never  Vaping Use   Vaping Use: Never used  Substance and Sexual Activity   Alcohol use: No    Alcohol/week: 0.0 standard drinks of alcohol   Drug  use: No   Sexual activity: Yes  Other Topics Concern   Not on file  Social History Narrative   Not on file   Social Determinants of Health   Financial Resource Strain: Not on file  Food Insecurity: Not on file  Transportation Needs: Not on file  Physical Activity: Not on file  Stress: Not on file  Social Connections: Not on file     Family History: The patient's family history includes Arthritis in an other family member; Asthma in an other family member; Diabetes in an other family member; Diabetes Mellitus II in his mother; Heart attack in his brother and father; Heart disease in an other family member.  ROS:   Please see the history of present illness.     All other systems reviewed and are negative.  EKGs/Labs/Other Studies Reviewed:    The following studies were reviewed today:  Echo 08/2022 1. Left ventricular ejection fraction, by estimation, is 50 to 55%. The  left ventricle has low normal function. The left ventricle has no regional  wall motion abnormalities. The left ventricular internal cavity size was  mildly dilated. There is abnormal   septal motion. Recommend obtaining Limited Echo with global longitudinal  strain.   2. Right ventricular systolic function is low normal. The right  ventricular size is normal.   3. The mitral valve is degenerative. Trivial mitral valve regurgitation.  No evidence of mitral stenosis.   4. The aortic valve is calcified. Aortic valve regurgitation is trivial.  Moderate aortic valve stenosis.   5. There is moderate dilatation of the aortic root, measuring 44 mm.   6. The inferior vena cava is normal in size with greater than 50%  respiratory variability, suggesting right atrial pressure of 3 mmHg.   Echo 01/2022 1. Left ventricular ejection fraction, by estimation, is 55 to 60%. The  left ventricle has normal function. Left ventricular endocardial border  not optimally defined to evaluate regional wall motion.   2. The mitral  valve is normal in structure. Trivial mitral valve  regurgitation. No evidence of mitral stenosis.   3. The aortic valve is tricuspid. There is moderate calcification of the  aortic valve. There is moderate thickening of the aortic valve. Aortic  valve regurgitation is not visualized. Mild aortic valve stenosis. Aortic  valve mean gradient measures 15.1  mmHg. Aortic valve peak gradient measures 30.2 mmHg. Aortic valve area, by  VTI measures 1.97 cm.     4. Aortic dilatation noted. There is mild dilatation of the aortic root,  measuring 41 mm.   5. Limited echo to evaluate LV function   Echo 08/2021 1. Left ventricular ejection fraction, by estimation, is 45%. The left  ventricle has mildly decreased function. The left ventrical demonstrates  global hypokinesis, more prominent in the basal inferior wall. The left  ventricular internal cavity size was  moderately dilated. There is mildly increased left ventricular  hypertrophy. Left ventricular diastolic parameters are consistent with  Grade III diastolic dysfunction (restrictive).   2. Right ventricular systolic function is low normal. The right  ventricular size is normal. There is moderately elevated pulmonary artery  systolic pressure. The estimated right ventricular systolic pressure is  40.7 mmHg.   3. Left atrial size was severely dilated.   4. Right atrial size was mildly dilated.   5. The mitral valve is grossly normal. Moderate mitral valve  regurgitation.   6. The aortic valve is abnormal, appears to be functionally bicuspid.  Aortic valve regurgitation is mild. Mild aortic valve stenosis. Aortic  valve area, by VTI measures 1.40 cm. Aortic valve mean gradient measures  18.7 mmHg.   7. Aortic dilatation noted. There is mild dilatation of the aortic root  measuring 43 mm.   8. The inferior vena cava is normal in size with greater than 50%  respiratory variability, suggesting right atrial pressure of 3 mmHg.    Comparison(s): LVEF has decreased somewhat in comparison to study from  2019. Degree of aortic stenosis is stable.   Cardiac cath 05/2018  Technically difficult procedure to complete due to subclavian and innominate artery tortuosity. 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  The vessel contains diffuse luminal irregularities from the proximal to the distal third of the vessel.  No focal high-grade obstruction is noted.  Need to exclude course between pulmonary artery and aorta as well as slitlike orifice/intramural course. First diagonal, which arises from the left main, contains calcified segmental 95% stenosis beyond the septal perforator.  There is also a large septal perforator that arises from the diagonal. Circumflex coronary artery gives origin to a trifurcating obtuse marginal #1 with the first branch of the trifurcation containing 95 to 99% stenosis.  This branch is small to moderate in size.  Continuation of the circumflex beyond the first obtuse marginal contains 70% stenosis before ending on a very tiny second obtuse marginal branch. The left main is widely patent. The right coronary artery is dominant and contains 50 to 60% distal stenosis, PDA contains 75% ostial stenosis, first LV branch contains segmental 80% stenosis, and large second left ventricular branch contains segmental 60 to 70% proximal narrowing. Overall LV function could not be assessed by the study.  EDP is markedly elevated at 26 mmHg suggesting an acute on chronic diastolic heart failure process given echo EF of 55%.   RECOMMENDATIONS:   Coronary CT angio to evaluate the course and ostium of the LAD.  If there are high risk features, should consider surgical consult and heart team approach concerning revascularization options. The AAOCA-LAD supplies the mid and distal third of  the anterior wall. If LAD is not potentially  ischemia inducing, diagonal could be intervened upon.  The branch of the obtuse marginal would be more difficult. For the time being, medical management of heart failure, monitor kidney function, risk factor modification, and and decision concerning revascularization after CT.  EKG:  EKG is  ordered today.  The ekg ordered today demonstrates NSR 1st degree AV block, nonspecific T wave changes  Recent Labs: 12/07/2022: ALT 20; BUN 14; Creatinine, Ser 1.00; Hemoglobin 13.3; Magnesium 2.1; Platelets 301; Potassium 4.2; Sodium 133  Recent Lipid Panel No results found for: "CHOL", "TRIG", "HDL", "CHOLHDL", "VLDL", "LDLCALC", "LDLDIRECT"   Physical Exam:    VS:  BP 139/67   Pulse 72   Ht _0  (1.803 m)   Wt 108.9 kg   SpO2 97%   BMI 33.47 kg/m     Wt Readings from Last 3 Encounters:  12/18/22 108.9 kg  12/07/22 107 kg  12/02/22 107.5 kg     GEN:  Well nourished, well developed in no acute distress HEENT: Normal NECK: No JVD; No carotid bruits LYMPHATICS: No lymphadenopathy CARDIAC: RRR, no murmurs, rubs, gallops RESPIRATORY:  Clear to auscultation without rales, wheezing or rhonchi  ABDOMEN: Soft, non-tender, non-distended MUSCULOSKELETAL:  No edema; No deformity  SKIN: Warm and dry NEUROLOGIC:  Alert and oriented x 3 PSYCHIATRIC:  Normal affect   ASSESSMENT:    1. Persistent atrial fibrillation ([place])   2. Coronary artery disease involving native coronary artery of native heart without angina pectoris   3. Aortic valve stenosis, etiology of cardiac valve disease unspecified   4. Essential hypertension    PLAN:    In order of problems listed above:  Paroxysmal atrial fibrillation Diagnosed by prior heart monitor with less than 1% burden.  Patient was unable to tolerate beta-blocker therapy due to heart rate in the 30s.  Patient saw Dr. Lovena Le in August 2023 and was overall doing well and not on AV nodal blocking agents.  Since that time patient saw [facility] and underwent  cardioversion and was started on Tikosyn.  Patient is not sure if he is taking Tikosyn at this time, pharmacy confirmed they have not sent in recent refills.  EKG today shows normal sinus rhythm with a first-degree AV block, PACs.  Patient has not been on Eliquis for an unknown time or reason.  It is possible he has had prior biopsies and Eliquis was never restarted postprocedure.  I will refill Eliquis 5 mg twice daily.  Will refer back to EP at follow-up to discuss Tikosyn.  CAD s/p CABG in 05/2018 No anginal symptoms reported. Continue statin. Not on ASA due to Eliquis.  Not on beta-blocker due to bradycardia. Further ischemic workup at this time.  Aortic stenosis Echo in October 2023 showed moderate aortic stenosis.  Hypertension BP is mildly elevated. Refill spiro 12.60m daily. BMET in 1-2 weeks.  It appears patient is also on hydrochlorothiazide 25 mg daily.  There is a lot of confusion with his medications today.  Disposition: Follow up in 3 month(s) with Dr. TLovena Le   Signed, CGentry PA-C  12/18/2022 4:09 PM    [place]

## 2022-12-18 NOTE — Patient Instructions (Signed)
Medication Instructions:  Your physician recommends that you continue on your current medications as directed. Please refer to the Current Medication list given to you today.  Take Spironolactone 12.5 mg Daily   *If you need a refill on your cardiac medications before your next appointment, please call your pharmacy*   Lab Work: Your physician recommends that you return for lab work in: 1-2 Weeks ( BMET)   If you have labs (blood work) drawn today and your tests are completely normal, you will receive your results only by: [employer] (if you have MyChart) OR A paper copy in the mail If you have any lab test that is abnormal or we need to change your treatment, we will call you to review the results.   Testing/Procedures: NONE    Follow-Up: At [facility], you and your health needs are our priority.  As part of our continuing mission to provide you with exceptional heart care, we have created designated Provider Care Teams.  These Care Teams include your primary Cardiologist (physician) and Advanced Practice Providers (APPs -  Physician Assistants and Nurse Practitioners) who all work together to provide you with the care you need, when you need it.  We recommend signing up for the patient portal called "MyChart".  Sign up information is provided on this After Visit Summary.  MyChart is used to connect with patients for Virtual Visits (Telemedicine).  Patients are able to view lab/test results, encounter notes, upcoming appointments, etc.  Non-urgent messages can be sent to your provider as well.   To learn more about what you can do with MyChart, go to [email or web address].    Your next appointment:   3 month(s)  Provider:   Lewayne Bunting, MD    Other Instructions Thank you for choosing [facility]!

## 2022-12-18 NOTE — Telephone Encounter (Signed)
Prescription refill request for Eliquis received. Indication: AF Last office visit: 12/18/22  C Furth PA-C Scr: 1.00 on 12/07/22 Age: 82 Weight: 108.9kg  Based on above findings Eliquis 5mg  twice daily is the appropriate dose.  Refill approved.

## 2022-12-18 NOTE — Telephone Encounter (Signed)
  Prescription refill request for Eliquis received. Indication: AF Last office visit: 12/18/22  C Furth PA-C Scr: 1.00 on 12/07/22 Age: 82 Weight: 108.9kg   Based on above findings Eliquis 5mg  twice daily is the appropriate dose.  Refill approved. Refill sent to [facility] .

## 2022-12-19 ENCOUNTER — Telehealth (INDEPENDENT_AMBULATORY_CARE_PROVIDER_SITE_OTHER): Payer: Medicare Other | Admitting: Surgery

## 2022-12-19 DIAGNOSIS — C8295 Follicular lymphoma, unspecified, lymph nodes of inguinal region and lower limb: Secondary | ICD-10-CM

## 2022-12-19 LAB — SURGICAL PATHOLOGY

## 2022-12-19 NOTE — Telephone Encounter (Signed)
[facility]  Called to update the patient regarding the results of his recent left inguinal lymph node biopsy.  I explained that the biopsy demonstrates something called follicular lymphoma.  He will need to meet with Dr. Ellin Saba to determine the next steps in treatment.  Advised him that his next appointment with Dr. Ellin Saba is on 1/23 at 3:30 PM.  He has no complaints regarding his surgical site.  All questions were answered to his expressed satisfaction.  Pathology: A. LYMPH NODE, LEFT INGUINAL, BIOPSY: -High-grade follicular lymphoma -See comment  COMMENT:  The sections show effacement of the lymph nodal architecture by numerous atypical lymphoid follicles characterized by back-to-back arrangement, attenuated or absent mantle zones, lack of polarity, and a homogeneous composition of small angulated lymphoid cells and large centroblastic lymphocytes.  The latter are relatively abundant averaging more than 15/cells/hpf.  A diffuse component is not seen.  In some areas, lymphoid follicles with reactive appearing germinal centers are seen. Flow cytometric analysis was performed [facility] [phone]) and shows a monoclonal, kappa restricted B-cell population expressing B-cell antigens including CD20 associated with CD10 expression.  Immunohistochemical stains for CD3, CD20, CD79a, CD5, CD10, BCL6, and BCL2 were performed with appropriate controls.  The atypical lymphoid follicles are strongly positive for CD20 and CD79a associated with CD10, BCL6 and Bcl-2 expression.  There is an admixed T-cell population to a lesser extent as primarily seen in the interfollicular zone and highlighted with CD3 and CD5.  No CD5 co-expression is seen in B-cell areas.  The findings are consistent with high-grade follicular lymphoma (grade 3A) with a predominant follicular pattern. The results were discussed with Dr. Robyne Peers on 12/19/2022.   Theophilus Kinds, DO [facility] [address] [place], [place] [address] [phone] (office)

## 2022-12-22 ENCOUNTER — Encounter (HOSPITAL_COMMUNITY): Payer: Self-pay | Admitting: Surgery

## 2022-12-23 ENCOUNTER — Inpatient Hospital Stay: Payer: Medicare Other | Attending: Hematology | Admitting: Hematology

## 2022-12-23 VITALS — BP 163/70 | HR 65 | Temp 97.1°F | Resp 18 | Ht 71.0 in | Wt 239.3 lb

## 2022-12-23 DIAGNOSIS — I5032 Chronic diastolic (congestive) heart failure: Secondary | ICD-10-CM | POA: Diagnosis not present

## 2022-12-23 DIAGNOSIS — I130 Hypertensive heart and chronic kidney disease with heart failure and stage 1 through stage 4 chronic kidney disease, or unspecified chronic kidney disease: Secondary | ICD-10-CM | POA: Insufficient documentation

## 2022-12-23 DIAGNOSIS — C8230 Follicular lymphoma grade IIIa, unspecified site: Secondary | ICD-10-CM | POA: Diagnosis not present

## 2022-12-23 DIAGNOSIS — J3801 Paralysis of vocal cords and larynx, unilateral: Secondary | ICD-10-CM | POA: Insufficient documentation

## 2022-12-23 DIAGNOSIS — I4891 Unspecified atrial fibrillation: Secondary | ICD-10-CM | POA: Insufficient documentation

## 2022-12-23 DIAGNOSIS — Z7984 Long term (current) use of oral hypoglycemic drugs: Secondary | ICD-10-CM | POA: Insufficient documentation

## 2022-12-23 DIAGNOSIS — Z79899 Other long term (current) drug therapy: Secondary | ICD-10-CM | POA: Insufficient documentation

## 2022-12-23 DIAGNOSIS — Z7901 Long term (current) use of anticoagulants: Secondary | ICD-10-CM | POA: Diagnosis not present

## 2022-12-23 DIAGNOSIS — N183 Chronic kidney disease, stage 3 unspecified: Secondary | ICD-10-CM | POA: Diagnosis not present

## 2022-12-23 DIAGNOSIS — C8238 Follicular lymphoma grade IIIa, lymph nodes of multiple sites: Secondary | ICD-10-CM

## 2022-12-23 DIAGNOSIS — E1122 Type 2 diabetes mellitus with diabetic chronic kidney disease: Secondary | ICD-10-CM | POA: Diagnosis not present

## 2022-12-23 NOTE — Progress Notes (Signed)
[place], [place] [address]   CLINIC:  Medical Oncology/Hematology  PCP:  Pablo Lawrence, NP [place] [address] [phone]   REASON FOR VISIT:  Follow-up for grade 3A follicular lymphoma, stage III  PRIOR THERAPY: None  NGS Results: Not done  CURRENT THERAPY: Bendamustine and rituximab  BRIEF ONCOLOGIC HISTORY:  Oncology History  Follicular lymphoma grade 3a ([place])  12/23/2022 Initial Diagnosis   Follicular lymphoma grade 3a ([place])   12/23/2022 Cancer Staging   Staging form: Hodgkin and Non-Hodgkin Lymphoma, AJCC 8th Edition - Clinical stage from 6/72/0947: Stage III (Follicular lymphoma) - Signed by Derek Jack, MD on 12/23/2022 Histopathologic type: Follicular lymphoma, grade 3 Stage prefix: Initial diagnosis   01/07/2023 -  Chemotherapy   Patient is on Treatment Plan : NON-HODGKINS LYMPHOMA Rituximab D1 + Bendamustine D1,2 q28d x 6 cycles       CANCER STAGING:  Cancer Staging  Follicular lymphoma grade 3a ([place]) Staging form: Hodgkin and Non-Hodgkin Lymphoma, AJCC 8th Edition - Clinical stage from 0/96/2836: Stage III (Follicular lymphoma) - Signed by Derek Jack, MD on 12/23/2022   INTERVAL HISTORY:  Mr. Tetreault 82 y.o. male is seen for follow-up after the inguinal lymph node biopsy.  He complains of feeling severe fatigue.  Chronic cough and shortness of breath have been stable.  He complains of feeling congested in the upper chest and nasal region.  He is eating well and maintaining his weight.  He recently had a left inguinal lymph node excisional biopsy by Dr. Okey Dupre.    REVIEW OF SYSTEMS:  Review of Systems  Constitutional:  Positive for fatigue.  Respiratory:  Positive for shortness of breath (On exertion).   Psychiatric/Behavioral:  Positive for sleep disturbance.   All other systems reviewed and are negative.    PAST MEDICAL/SURGICAL HISTORY:  Past Medical  History:  Diagnosis Date   A-fib [facility])    AAA Ultrasound 01/11/2020    AAA [place] 01/11/20:  no abdominal aortic aneurysm, 2.4 cm   Aortic insufficiency    a. mild-mod by intraop TEE 05/2018 (no aortic stenosis) // Echo 2/21: mild //  Echo 9/22: trivial   Aortic stenosis    Echo 9/22: mild (mean 16) // Echo 2/21: mild (mean 18.7)   CAD (coronary artery disease)    a. NSTEMI 05/2018 with multivessel disease -> s/p CABGx4 05/11/18 // Myoview 06/07/20: inf scar w mild peri-infarct ischemia, EF 37; Int Risk   Carotid artery disease ([place])    PreCABG [place] 05/07/18: Bilat ICA 1-39   CKD (chronic kidney disease), stage III ([place])    HFimpEF (heart failure with improved EF)    Echocardiogram 01/11/20: EF 45, Gr 3 DD, mild AI, mild AS (mean 18.7 mmHg) // Echocardiogram 08/14/21:  EF 55-60, inf HK, mild LVH, Gr 1 DD, normal RVSF, RVSP 25.3, mild LAE, trivial MR, MAC, trivial AI, mild AS (mean 16 mmHg, Vmax 283 cm/s, DI 0.41)   History of nephrolithiasis 2004   Hyperlipidemia    Hypertension    IBS (irritable colon syndrome)    Ischemic cardiomyopathy    Mitral regurgitation    Echo 9/22: trivial MR   Postoperative atrial fibrillation (H[facility])    PVC's (premature ventricular contractions)    Reflux    Type 2 diabetes mellitus ([place])    Past Surgical History:  Procedure Laterality Date   BRONCHIAL NEEDLE ASPIRATION BIOPSY  11/17/2022   Procedure: BRONCHIAL NEEDLE ASPIRATION BIOPSIES;  Surgeon: Collene Gobble, MD;  Location: [facility] ENDOSCOPY;  Service: Cardiopulmonary;;   CARDIOVERSION     CORONARY ARTERY BYPASS GRAFT N/A 05/11/2018   Procedure: CORONARY ARTERY BYPASS GRAFTING (CABG), on pump, times four, using left internal mammary artery and endoscopically harvested right greater saphenous leg vein.;  Surgeon: Ivin Poot, MD;  Location: [place];  Service: Open Heart Surgery;  Laterality: N/A;   ESOPHAGOGASTRODUODENOSCOPY  02/04/2012   Procedure: ESOPHAGOGASTRODUODENOSCOPY (EGD);  Surgeon: Rogene Houston, MD;   [facility] ENDO [facility];  Service: Endoscopy;  Laterality: N/A;  100   INGUINAL LYMPH NODE BIOPSY Left 12/15/2022   Procedure: INGUINAL LYMPH NODE BIOPSY;  Surgeon: Rusty Aus, DO;  Location: [facility] ORS;  Service: General;  Laterality: Left;   LEFT HEART CATH AND CORONARY ANGIOGRAPHY N/A 05/04/2018   Procedure: LEFT HEART CATH AND CORONARY ANGIOGRAPHY;  Surgeon: Belva Crome, MD;  Location: [place] CV LAB;  Service: Cardiovascular;  Laterality: N/A;   NASAL FRACTURE SURGERY     TEE WITHOUT CARDIOVERSION N/A 05/11/2018   Procedure: TRANSESOPHAGEAL ECHOCARDIOGRAM (TEE);  Surgeon: Prescott Gum, Collier Salina, MD;  Location: [place];  Service: Open Heart Surgery;  Laterality: N/A;   VIDEO BRONCHOSCOPY WITH ENDOBRONCHIAL ULTRASOUND Right 11/17/2022   Procedure: VIDEO BRONCHOSCOPY WITH ENDOBRONCHIAL ULTRASOUND;  Surgeon: Collene Gobble, MD;  Location: [place];  Service: Cardiopulmonary;  Laterality: Right;     SOCIAL HISTORY:  Social History   Socioeconomic History   Marital status: Widowed    Spouse name: Not on file   Number of children: Not on file   Years of education: Not on file   Highest education level: Not on file  Occupational History   Not on file  Tobacco Use   Smoking status: Never   Smokeless tobacco: Never  Vaping Use   Vaping Use: Never used  Substance and Sexual Activity   Alcohol use: No    Alcohol/week: 0.0 standard drinks of alcohol   Drug use: No   Sexual activity: Yes  Other Topics Concern   Not on file  Social History Narrative   Not on file   Social Determinants of Health   Financial Resource Strain: Not on file  Food Insecurity: Not on file  Transportation Needs: Not on file  Physical Activity: Not on file  Stress: Not on file  Social Connections: Not on file  Intimate Partner Violence: Not on file    FAMILY HISTORY:  Family History  Problem Relation Age of Onset   Diabetes Mellitus II Mother    Heart attack Father    Heart attack  Brother    Heart disease Other    Arthritis Other    Asthma Other    Diabetes Other     CURRENT MEDICATIONS:  Outpatient Encounter Medications as of 12/23/2022  Medication Sig   albuterol (VENTOLIN HFA) 108 (90 Base) MCG/ACT inhaler Inhale 2 puffs into the lungs every 6 (six) hours as needed for wheezing or shortness of breath.   apixaban (ELIQUIS) 5 MG TABS tablet Take 1 tablet (5 mg total) by mouth 2 (two) times daily.   atorvastatin (LIPITOR) 40 MG tablet Take 40 mg by mouth at bedtime.   cyanocobalamin (VITAMIN B12) 1000 MCG tablet Take 1,000 mcg by mouth daily.   cyanocobalamin (VITAMIN B12) 1000 MCG/ML injection Inject 1,000 mcg into the skin every 30 (thirty) days.   docusate sodium (COLACE) 100 MG capsule Take 1 capsule (100 mg total) by mouth 2 (two) times daily.  dofetilide (TIKOSYN) 500 MCG capsule Take 500 mcg by mouth 2 (two) times daily.   famotidine (PEPCID) 20 MG tablet Take 1 tablet (20 mg total) by mouth 2 (two) times daily.   fluticasone (FLONASE) 50 MCG/ACT nasal spray Place 1 spray into both nostrils daily.   glipiZIDE (GLUCOTROL XL) 10 MG 24 hr tablet Take 10 mg by mouth daily.   hydrochlorothiazide (HYDRODIURIL) 25 MG tablet Take 25 mg by mouth daily.   ibuprofen (ADVIL) 200 MG tablet Take 400-600 mg by mouth every 6 (six) hours as needed for moderate pain.   loratadine (CLARITIN) 10 MG tablet Take 10 mg by mouth daily.   Melatonin 10 MG CAPS Take 30 mg by mouth at bedtime as needed (sleep).   metFORMIN (GLUCOPHAGE-XR) 750 MG 24 hr tablet Take 750 mg by mouth 2 (two) times daily with a meal.   oxyCODONE (ROXICODONE) 5 MG immediate release tablet Take 1 tablet (5 mg total) by mouth every 6 (six) hours as needed.   pantoprazole (PROTONIX) 40 MG tablet Take 40 mg by mouth daily.   polyethylene glycol (MIRALAX / GLYCOLAX) 17 g packet Take 17 g by mouth daily. (Patient taking differently: Take 17 g by mouth daily as needed for moderate constipation.)   potassium chloride  (MICRO-K) 10 MEQ CR capsule Take 10 mEq by mouth daily.   Povidone, PF, (IVIZIA DRY EYES) 0.5 % SOLN Place 1 drop into both eyes daily as needed (dry eyes).   spironolactone (ALDACTONE) 25 MG tablet Take 0.5 tablets (12.5 mg total) by mouth daily.   valsartan (DIOVAN) 40 MG tablet Take 1 tablet (40 mg total) by mouth daily.   [EXPIRED] acetaminophen (TYLENOL) 500 MG tablet Take 2 tablets (1,000 mg total) by mouth every 6 (six) hours for 7 days.   No facility-administered encounter medications on file as of 12/23/2022.    ALLERGIES:  No Known Allergies   PHYSICAL EXAM:  ECOG Performance status: 1  Vitals:   12/23/22 1533  BP: (!) 163/70  Pulse: 65  Resp: 18  Temp: (!) 97.1 F (36.2 C)  SpO2: 99%   Filed Weights   12/23/22 1533  Weight: 239 lb 4.8 oz (108.5 kg)   Physical Exam Vitals reviewed.  Constitutional:      Appearance: Normal appearance.  Cardiovascular:     Rate and Rhythm: Normal rate and regular rhythm.     Pulses: Normal pulses.     Heart sounds: Normal heart sounds.  Pulmonary:     Effort: Pulmonary effort is normal.     Breath sounds: Normal breath sounds.  Abdominal:     Palpations: Abdomen is soft. There is no mass.  Neurological:     Mental Status: He is alert.  Psychiatric:        Mood and Affect: Mood normal.      LABORATORY DATA:  I have reviewed the labs as listed.  CBC    Component Value Date/Time   WBC 8.4 12/07/2022 1000   RBC 4.85 12/07/2022 1000   HGB 13.3 12/07/2022 1025   HCT 39.0 12/07/2022 1025   PLT 301 12/07/2022 1000   MCV 89.7 12/07/2022 1000   MCH 30.5 12/07/2022 1000   MCHC 34.0 12/07/2022 1000   RDW 13.3 12/07/2022 1000   LYMPHSABS 1.5 12/07/2022 1000   MONOABS 1.1 (H) 12/07/2022 1000   EOSABS 0.0 12/07/2022 1000   BASOSABS 0.0 12/07/2022 1000      Latest Ref Rng & Units 12/07/2022   10:25 AM 12/07/2022  10:00 AM 11/17/2022   10:29 AM  CMP  Glucose 70 - 99 mg/dL 223  231  225   BUN 8 - 23 mg/dL _0 Creatinine 0.61 - 1.24 mg/dL 1.00  1.18  1.22   Sodium 135 - 145 mmol/L 133  130  136   Potassium 3.5 - 5.1 mmol/L 4.2  4.3  3.9   Chloride 98 - 111 mmol/L 97  95  102   CO2 22 - 32 mmol/L  24  23   Calcium 8.9 - 10.3 mg/dL  8.5  8.8   Total Protein 6.5 - 8.1 g/dL  7.3    Total Bilirubin 0.3 - 1.2 mg/dL  0.9    Alkaline Phos 38 - 126 U/L  97    AST 15 - 41 U/L  21    ALT 0 - 44 U/L  20      DIAGNOSTIC IMAGING:  I have independently reviewed the scans and discussed with the patient.  ASSESSMENT:   1.  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  No B symptoms. - Reports hoarseness of voice for the last 1 month with occasional choking to liquids. - CT chest (09/03/2022): Interval enlargement of matted appearing pretracheal and right paratracheal lymph node measuring 2.7 x 2.7 cm, previously 1.9 x 1.6 cm (08/23/2021), mild pulmonary fibrosis. - PET/CT scan (09/11/2022): Mildly metabolically lower cervical lymph nodes bilaterally, numerous on the right with SUV 3.7.  Nodule in the deep lobe of the right parotid gland, 1.4 x 1.0 SUV 10.2.  Asymmetric hypermetabolic activity in the right vocal cord, SUV 8.7 with no focal mass.  Right paratracheal mass 3.7 x 2.8 cm SUV 10.6.  10 mm left paratracheal node with SUV 5.5.  Left para-aortic adenopathy 4.5 x 2.4 cm, SUV 12.4.  Additional smaller retroperitoneal and mesenteric lymph nodes mildly hypermetabolic.  10 mm left inguinal node with SUV 4.3.  Hypermetabolic activity along the medial aspect of the left femoral neck SUV 6.8.  No clear bone lesion on the CT images. - Retroperitoneal lymph node biopsy (10/10/2022): Atypical lymphoid proliferation, but concerning for lymphoproliferative disorder. - ENT evaluation (Dr. Constance Holster) fiberoptic laryngoscopy: Left vocal cord paralysis - Bronchoscopy and biopsy of 4R lymph node (11/17/2022): Suspicious for B-cell lymphoma. - Left inguinal  excisional biopsy on 12/15/2022 - Pathology: High-grade follicular lymphoma, grade 3   2.  Social/family history: - He lives at home by himself and is independent of all ADLs and IADLs. 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  Non-smoker.  Has secondhand smoke exposure from his wife. - No family history of malignancies.   PLAN:  1.  Stage III high-grade follicular lymphoma (grade 3A): -I have discussed the biopsy reports of the left inguinal lymph node which showed high-grade follicular lymphoma. - He has severe fatigue to the extent that he is not able to function well. - He does not have any fevers or night sweats or weight loss. - I have recommended treatment of his follicular lymphoma with Bendamustine and rituximab based regimen every 28 days.  We will use Bendamustine low-dose at 50 mg/m for the first cycle and titrate up if he tolerates well. - We discussed the chemotherapy regimen and side effects in detail.  Literature was given to him.  He will have chemo education next week and chemotherapy following week. - We will ask Dr.Pappayliou for  port placement.  2.  TLS prophylaxis: - We will start him on allopurinol 300 mg daily.  3.  Left vocal cord paralysis: - Dr. Constance Holster did laryngoscopy on 10/30/2022. - He recommends CT scan of the neck with contrast.  We will order it at next visit.    Orders placed this encounter:  Orders Placed This Encounter  Procedures   Lactate dehydrogenase   Uric acid   Magnesium   CBC with Differential   Comprehensive metabolic panel   Lactate dehydrogenase   Uric acid   Magnesium   CBC with Differential   Comprehensive metabolic panel   Lactate dehydrogenase   Uric acid   Magnesium   CBC with Differential   Comprehensive metabolic panel   Lactate dehydrogenase   Uric acid   Magnesium   CBC with Differential   Comprehensive metabolic panel       Derek Jack, MD [facility] [phone]

## 2022-12-23 NOTE — Progress Notes (Signed)
START ON PATHWAY REGIMEN - Lymphoma and CLL     A cycle is every 28 days:     Rituximab-xxxx      Bendamustine   **Always confirm dose/schedule in your pharmacy ordering system**  Patient Characteristics: Follicular Lymphoma, Grades 1, 2, and 3A, First Line, Stage III / IV, Symptomatic or Bulky Disease Disease Type: Follicular Lymphoma, Grade 1, 2, or 3A Disease Type: Not Applicable Disease Type: Not Applicable Line of Therapy: First Line Disease Characteristics: Symptomatic or Bulky Disease Intent of Therapy: Non-Curative / Palliative Intent, Discussed with Patient 

## 2022-12-23 NOTE — Patient Instructions (Addendum)
[facility]  Discharge Instructions  You were seen and examined today by Dr. Ellin Saba.  Dr. Ellin Saba has reviewed your recent biopsy results that revealed a type of lymphoma known as high-grade follicular lymphoma. This is a type of cancer arising from the lymph nodes.  Dr. Ellin Saba has recommended starting you on a combination of a chemotherapy drug and a monoclonal antibody. The chemo drug is called Bendamustine and the antibody drug is called Rituxan. You will come to the clinic for treatment 2 days in a row. On the first day you get both drugs. On the second day, you will only receive bendamustine. We repeat this every 28 days.   The lymphoma is likely what is causing your severe fatigue and other symptoms. Once we start treatment, your symptoms should improve.   You will need a port a cath placed in order to give this treatment. We will arrange for you to see a surgeon.   Follow-up as scheduled.  Thank you for choosing [facility] to provide your oncology and hematology care.   To afford each patient quality time with our provider, please arrive at least 15 minutes before your scheduled appointment time. You may need to reschedule your appointment if you arrive late (10 or more minutes). Arriving late affects you and other patients whose appointments are after yours.  Also, if you miss three or more appointments without notifying the office, you may be dismissed from the clinic at the provider's discretion.    Again, thank you for choosing [facility].  Our hope is that these requests will decrease the amount of time that you wait before being seen by our physicians.   If you have a lab appointment with the Cancer Center please come in thru the Main Entrance and check in at the main information desk.           _____________________________________________________________  Should you have questions after your  visit to [facility], please contact our office at [phone] and follow the prompts.  Our office hours are 8:00 a.m. to 4:30 p.m. Monday - Thursday and 8:00 a.m. to 2:30 p.m. Friday.  Please note that voicemails left after 4:00 p.m. may not be returned until the following business day.  We are closed weekends and all major holidays.  You do have access to a nurse 24-7, just call the main number to the clinic [phone] and do not press any options, hold on the line and a nurse will answer the phone.    For prescription refill requests, have your pharmacy contact our office and allow 72 hours.    Masks are optional in the cancer centers. If you would like for your care team to wear a mask while they are taking care of you, please let them know. You may have one support person who is at least 80 years old accompany you for your appointments.

## 2022-12-24 ENCOUNTER — Other Ambulatory Visit: Payer: Self-pay

## 2022-12-24 ENCOUNTER — Encounter: Payer: Self-pay | Admitting: Hematology

## 2022-12-29 ENCOUNTER — Telehealth: Payer: Self-pay | Admitting: Dietician

## 2022-12-29 ENCOUNTER — Inpatient Hospital Stay: Payer: Medicare Other | Admitting: Dietician

## 2022-12-29 NOTE — Telephone Encounter (Signed)
Nutrition Assessment   Reason for Assessment: New Patient    ASSESSMENT: 82 year old male with follicular lymphoma. He is planning to start chemotherapy with rituximab + bendamustine q28d (first scheduled 2/7)  Past medical history includes atrial fibrillation, CAD s/p CABG x5, IBS, DM2, ischemic cardiomyopathy  Spoke with patient via telephone. Introduced self and services available at [facility]. Patient is appreciative of call. Patient reports ongoing sinus congestion for over one month. This is aggravating as he as tried everything over the counter without relief. Patient is planning to see his PCP tomorrow for this. Patient reports he has a good appetite and eating well. He denies nutrition impact symptoms.    Nutrition Focused Physical Exam: telephone visit (unable to complete)   Medications: eliquis, lipitor, B12, colace, tikosyn, pepcid, glipizide, metformin, micro-k, protonix, valsartan    Labs: 1/15 labs reviewed    Anthropometrics:   Height: 5'11" Weight: 239 lb 4.8 oz UBW: 230-235 lb  BMI: 33.38    NUTRITION DIAGNOSIS: No nutrition diagnosis at this time   MONITORING, EVALUATION, GOAL: Patient will tolerate adequate calories and protein to maintain stable weight through out treatment    Next Visit: To be scheduled if needed with treatment

## 2022-12-30 ENCOUNTER — Ambulatory Visit (INDEPENDENT_AMBULATORY_CARE_PROVIDER_SITE_OTHER): Payer: Medicare Other | Admitting: Surgery

## 2022-12-30 DIAGNOSIS — C8295 Follicular lymphoma, unspecified, lymph nodes of inguinal region and lower limb: Secondary | ICD-10-CM

## 2022-12-30 NOTE — Progress Notes (Signed)
[facility]  Called to speak with the patient regarding his left groin incision site and to discuss port-a-cath insertion.  He has no issues related to his incision site from excisional biopsy of left inguinal lymph node on 1/15.  The pathology demonstrated follicular lymphoma.  He has subsequently followed up with Dr. Delton Coombes, and he has been scheduled with me regarding Port-a-cath insertion.  The risk and benefits of Right IJ Port-a-cath insertion were discussed including but not limited to bleeding, infection, injury to surrounding structures, pneumothorax, and need for additional procedures.  After careful consideration, Alejandro [name] has decided to proceed with surgery.  He is currently scheduled for surgery on 2/6.  All questions were answered to his expressed satisfaction.  I spent 10 minutes on the phone with the patient discussing need for port-a-cath and describing the procedure to the patient.  The patient was informed that this is a billable encounter, and he may receive a bill related to this phone call.  Graciella Freer, DO [facility] [address] [place], [place] [address] [phone] (office)

## 2023-01-01 ENCOUNTER — Other Ambulatory Visit: Payer: Self-pay | Admitting: *Deleted

## 2023-01-01 ENCOUNTER — Inpatient Hospital Stay: Payer: Medicare Other | Attending: Hematology | Admitting: Licensed Clinical Social Worker

## 2023-01-01 ENCOUNTER — Inpatient Hospital Stay: Payer: Medicare Other

## 2023-01-01 ENCOUNTER — Encounter: Payer: Self-pay | Admitting: *Deleted

## 2023-01-01 DIAGNOSIS — R5381 Other malaise: Secondary | ICD-10-CM

## 2023-01-01 DIAGNOSIS — Z5111 Encounter for antineoplastic chemotherapy: Secondary | ICD-10-CM | POA: Insufficient documentation

## 2023-01-01 DIAGNOSIS — Z79899 Other long term (current) drug therapy: Secondary | ICD-10-CM | POA: Insufficient documentation

## 2023-01-01 DIAGNOSIS — C8230 Follicular lymphoma grade IIIa, unspecified site: Secondary | ICD-10-CM | POA: Insufficient documentation

## 2023-01-01 DIAGNOSIS — C8238 Follicular lymphoma grade IIIa, lymph nodes of multiple sites: Secondary | ICD-10-CM

## 2023-01-01 DIAGNOSIS — J3489 Other specified disorders of nose and nasal sinuses: Secondary | ICD-10-CM | POA: Insufficient documentation

## 2023-01-01 NOTE — Progress Notes (Signed)
Referral for PT for deconditioning made to [facility].

## 2023-01-01 NOTE — Progress Notes (Signed)
[place] Clinical Social Work  Initial Assessment   Alejandro [name] is a 82 y.o. year old male contacted by phone. Clinical Social Work was referred by medical provider for assessment of psychosocial needs.   SDOH (Social Determinants of Health) assessments performed: Yes SDOH Interventions    Flowsheet Row CARDIAC REHAB PHASE II EXERCISE from 11/12/2018 in [place] from 08/13/2018 in [place]  SDOH Interventions    Depression Interventions/Treatment  --  [Patient does not need treatment at discharge. ] Patient refuses Treatment       SDOH Screenings   Depression (PHQ2-9): Low Risk  (11/12/2018)  Tobacco Use: Low Risk  (12/22/2022)     Distress Screen completed: No     No data to display            Family/Social Information:  Housing Arrangement: patient lives alone.  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   Pt's spouse passed away. Family members/support persons in your life? When asked about family, pt states he has only a grandson who does not reside close by.  Pt has a close friend who resides next door and has been assisting pt as he has needed; however, pt states he is extremely fatigued and not doing much beyond sitting in a chair or laying in bed at the moment.   Transportation concerns: pt's neighbor will reportedly be providing pt's transport  Employment: Retired .  Income source: Paediatric nurse concerns:  not at present Type of concern: None Food access concerns: no Religious or spiritual practice: Hydrographic surveyor Currently in place:  none  Coping/ Adjustment to diagnosis: Patient understands treatment plan and what happens next? yes Concerns about diagnosis and/or treatment: How will I care for myself and Quality of life Patient reported stressors: Adjusting to my illness and Physical issues Hopes and/or  priorities: Pt's priority is to start treatment w/ the hope of improvement in his level of energy Patient enjoys being outside Current coping skills/ strengths: Motivation for treatment/growth  and Physical Health     SUMMARY: Current SDOH Barriers:  Limited social support  Clinical Social Work Clinical Goal(s):  Explore community resource options for unmet needs related to:  Social Connections  Interventions: Discussed common feeling and emotions when being diagnosed with cancer, and the importance of support during treatment Informed patient of the support team roles and support services at [facility] Provided [place] contact information and encouraged patient to call with any questions or concerns Referred patient to [facility] for supportive services and requested a home care referral be placed for home PT and RN to which pt verbalized agreement.   Follow Up Plan: Patient will contact CSW with any support or resource needs Patient verbalizes understanding of plan: Yes    Henriette Combs, LCSW

## 2023-01-02 ENCOUNTER — Encounter: Payer: Self-pay | Admitting: *Deleted

## 2023-01-02 ENCOUNTER — Encounter (HOSPITAL_COMMUNITY)
Admission: RE | Admit: 2023-01-02 | Discharge: 2023-01-02 | Disposition: A | Discharge status: Home / Self Care | Payer: Medicare Other | Source: Ambulatory Visit | Attending: Surgery | Admitting: Surgery

## 2023-01-02 ENCOUNTER — Other Ambulatory Visit: Payer: Self-pay

## 2023-01-02 ENCOUNTER — Encounter (HOSPITAL_COMMUNITY): Payer: Self-pay

## 2023-01-02 NOTE — Progress Notes (Signed)
Patient has been accepted for PT with [facility] and will be seen today, per Rema Jasmine.

## 2023-01-02 NOTE — Patient Instructions (Addendum)
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  We will treat you in the clinic every 4 weeks with two different drugs.  Those drugs are rituximab (Rituxan) and bendamustine.  You will come two days in a row every 4 weeks.  On the first day you will receive both drugs.  On the second day, you will receive the bendamustine only.  We will do this for a total of 6 cycles. The intent of treatment is to control this cancer, shrink it and prevent it from spreading further, and to alleviate any symptoms you may be having related to this lymphoma.  You will see the doctor regularly throughout treatment.  We will obtain blood work from you prior to every treatment and monitor your results to make sure it is safe to give your treatment. The doctor monitors your response to treatment by the way you are feeling, your blood work, and by obtaining scans periodically.  There will be wait times while you are here for treatment.  It will take about 30 minutes to 1 hour for your lab work to result.  Then there will be wait times while pharmacy mixes your medications.     Medications you will receive in the clinic prior to your chemotherapy medications:  Aloxi:  ALOXI is used in adults to help prevent nausea and vomiting that happens with certain chemotherapy drugs.  Aloxi is a long acting medication, and will remain in your system for about two days.   Dexamethasone:  This is a steroid given prior to chemotherapy to help prevent allergic reactions; it may also help prevent and control nausea and diarrhea.   Tylenol:  Given to prevent infusion reactions to rituximab.  Benadryl:  Antihistamine given to prevent allergic/infusion reactions to rituximab.     Rituximab (Generic Name) Other Name: Rituxan  About This Drug  Rituximab is a monoclonal antibody used to treat cancer. This drug is given in the vein (IV).  This first time this is given it will be  infused slower to monitor for infusion reactions.    Possible Side Effects (More Common)   Bone marrow depression. This is a decrease in the number of white blood cells, red blood cells, and platelets. This  may raise your risk of infection, make you tired and weak (fatigue), and raise your risk of bleeding.   Rash-skin irritation, redness or itching (dermatitis)   Flu-like symptoms: fever, headache, muscle and joint aches, and fatigue (low energy, feeling weak)   Infusion-related reactions   Hepatitis B - if you have ever had hepatitis B, the virus may come back during treatment with this drug. Your doctor will test to see if you have ever had hepatitis B prior to your treatment.   Changes in your central nervous system can happen. The central nervous system is made up of your brain and spinal cord. You could feel: extreme tiredness, agitation, confusion, or have: hallucinations (see or hear things that are not there), trouble understanding or speaking, loss of control of your bowels or bladder, eyesight changes, numbness or lack of strength to your arms, legs, face, or body, seizures or coma. If you start to have any of these symptoms let your doctor know right away.   Tumor lysis: This drug may act on the cancer cells very quickly. This may affect how your kidneys work. Your doctor will monitor your kidney function.   Changes in your liver function. Your doctor will  check your liver function as needed.   Nausea and throwing up (vomiting): these symptoms may happen within a few hours after your treatment and may last up to 24 hours. Medicines are available to stop or lessen these side effects.   Loose bowel movements (diarrhea) that may last for a few days   Abdominal pain   Infections   Cough, runny nose   Swelling of your legs, ankles and/or feet or hands   High blood pressure. Your doctor will check your blood pressure as needed.   Abnormal heart beat  Possible Side Effects  (Less Common)   Shortness of breath   Soreness of the mouth and throat. You may have red areas, white patches, or sores that hurt.  Infusion Reactions  Infusion Reactions are the most common side effect linked to use of this drug and can be quite severe. Medicines will be given before you get the drug to lower the severity of this side effect. The infusion reactions are the worse with the first dose of the drug and become less severe with more doses of the drug. While you are getting this drug in your vein (IV), tell your nurse right away if you have any of these symptoms of an allergic reaction:   Trouble catching your breath   Feeling like your tongue or throat are swelling   Feeling your heart beat quickly or in a not normal way (palpitations)   Feeling dizzy or lightheaded   Flushing, itching, rash, and/or hives   Treating Side Effects   Ask your doctor or nurse about medicine to stop or lessen headache, loose bowel movements (diarrhea), constipation, nausea, throwing up (vomiting), or pain.   If you get a rash do not put anything it unless your doctor or nurse says you may. Keep the area around the rash clean and dry. Ask your doctor for medicine if the rash bothers you.   Drink 6-8 cups of fluids each day unless your doctor has told you to limit your fluid intake due to some other health problem. A cup is 8 ounces of fluid. If you throw up or have loose bowel movements, you should drink more fluids so that you do not become dehydrated (lack of water in the body from losing too much fluid).   If you are not able to move your bowels, check with your doctor or nurse before you use enemas, laxatives, or suppositories   If you have mouth sores, avoid mouthwash that has alcohol. Also avoid alcohol and smoking because they can bother your mouth and throat.   If you have a nose bleed, sit with your head tipped slightly forward. Apply pressure by lightly pinching the bridge of your  nose between your thumb and forefinger. Call your doctor if you feel dizzy or faint or if the bleeding doesn't stop after 10 to 15 minutes   Important Information   After treatment with this drug, vaccination with live viruses should be delayed until the immune system recovers.   Symptoms of abnormal bleeding may be: coughing up blood, throwing up blood (may look like coffee grounds), red or black, tarry bowel movements, blood in urine, abnormally heavy menstrual flow, nosebleeds, or any unusual bleeding.   Symptoms of high blood pressure may be: headache, blurred vision, confusion, chest pain, or a feeling that your heart is beat differently.   Urinary tract infection. Symptoms may include:   Pain or burning when you pass urine   Feeling like you have  to pass urine often, but not much comes out when you do.   Tender or heavy feeling in your lower abdomen   Cloudy urine and/or urine that smells bad.   Pain on one side of your back under your ribs. This is where your kidneys are.   Fever, chills, nausea and/or throwing up   Food and Drug Interactions  There are no known interactions of rituximab and any food. This drug may interact with other medicines. Tell your doctor and pharmacist about all the medicines and dietary supplements (vitamins, minerals, herbs and others) that you are taking at this time. The safety and use of dietary supplements and alternative diets are often not known. Using these might affect your cancer or interfere with your treatment. Until more is known, you should not use dietary supplements or alternative diets without your cancer doctor's help.   When to Call the Doctor  Call your doctor or nurse right away if you have any of these symptoms:   Fever of 100.4 F (38 C) higher   Chills   Trouble breathing   Rash with or without itching   Blistering or peeling of skin   Chest pain or symptoms of a heart attack. Most heart attacks involve pain in the  center of the chest that lasts more than a few minutes. The pain may go away and come back or it can be constant. It can feel like pressure, squeezing, fullness, or pain. Sometimes pain is felt in one or both arms, the back, neck, jaw, or stomach. If any of these symptoms last 2 minutes, call 911   Easy bleeding or bruising   Blood in urine or bowel movements   Feeling that your heart is beating in a fast or not normal way (palpitations)   Nausea that stops you from eating or drinking   Throwing up/vomiting   Abdominal pain   Loose bowel movements (diarrhea) 4 times in one day or diarrhea with weakness or lightheadedness   No bowel movement in 3 days or if you feel uncomfortable   Feeling dizzy or lightheaded   Changes in your speech or vision   Feeling confused   Weakness of your arms and legs or poor coordination (feeling clumsy)   Signs of liver problems: dark urine, pale bowel movements, bad stomach pain, feeling very tired and weak, unusual itching, or yellowing of skin or eyes   Symptoms of a urinary tract infection (see important information)   Call your doctor or nurse as soon as possible if you have any of these symptoms:   Swelling of your legs, ankles and/or feet   Fatigue and /or weakness that interferes with your daily activities   Joint and muscle pain or muscle spasms that are not relieved by prescribed medicines   Cough that lasts longer than normal   Reproduction Concerns   Pregnancy warning: This drug is known to cross the placenta. This drug may have harmful effects on an unborn baby. Effective methods of birth control should be used during treatment with this drug and for 12 months after the last treatment. If exposure occurs to an unborn baby, the baby's immune system may be affected, which could last for months after birth. Until the immune system recovers, live vaccines should not be administered to the baby. Be sure to talk with your doctor if you  are pregnant or planning to become pregnant while getting this drug.   Breast feeding warning: It is not known if rituximab  is passed into human breast milk. In animal studies, this drug was detected in in breast milk. For this reason, women should talk to their doctor about the risks and benefits of breast feeding during treatment with this drug because this drug may enter the breast milk and badly harm a breast feeding baby.     Bendamustine Donnie Aho, [place])   About This Drug Bendamustine is used to treat cancer. It is given in the vein (IV).  It takes 10 minutes to infuse.    Possible Side Effects  Bone marrow suppression. This is a decrease in the number of white blood cells, red blood cells, and platelets. This may raise your risk of infection, make you tired and weak (fatigue), and raise your risk of bleeding.    Soreness of the mouth and throat. You may have red areas, white patches, or sores that hurt.    Nausea and vomiting (throwing up)    Diarrhea (loose bowel movements)    Constipation (not able to move bowels)    Fever    Tiredness    Changes in your liver function    Decreased appetite (decreased hunger)    Weight loss    Headache    Cough, trouble breathing    Rash   Note: Each of the side effects above was reported in 15% or greater of patients treated with bendamustine. Not all possible side effects are included above.   Warnings and Precautions    Severe bone marrow suppression and infections, which may be life-threatening.    Allergic reactions, including anaphylaxis are rare but may happen in some patients. Signs of allergic reaction to this drug may be swelling of the face, feeling like your tongue or throat are swelling, trouble breathing, rash, itching, fever, chills, feeling dizzy, and/or feeling that your heart is beating in a fast or not normal way. If this happens, do not take another dose of this drug. You should get urgent medical  treatment.      While you are getting this drug in your vein (IV), you may have a reaction to the drug. Sometimes you may be given medication to stop or lessen these side effects. Your nurse will check you closely for these signs: fever or shaking chills, flushing, facial swelling, feeling dizzy, headache, trouble breathing, rash, itching, chest tightness, or chest pain. These reactions may happen after your infusion. If this happens, call 911 for emergency care    Skin and tissue irritation may involve redness, pain, warmth, or swelling at the IV site. This happens if the drug leaks out of the vein and into nearby tissue.    Tumor lysis syndrome: This drug may act on the cancer cells very quickly. This may affect how your kidneys work.    Severe allergic skin reaction, which may be life-threatening. You may develop blisters on your skin that are filled with fluid or a severe red rash all over your body that may be painful.    Severe changes in your liver function, which may be life-threatening.    This drug may raise your risk of getting a second cancer such as leukemia.   Note: Some of the side effects above are very rare. If you have concerns and/or questions, please discuss them with your medical team.   Important Information  This drug may be present in the saliva, tears, sweat, urine, stool, vomit, semen, and vaginal secretions. Talk to your doctor and/or your nurse about the necessary precautions to take during  this time.   Treating Side Effects  Manage tiredness by pacing your activities for the day.    Be sure to include periods of rest between energy-draining activities.    To decrease the risk of infections, wash your hands regularly.    Avoid close contact with people who have a cold, the flu, or other infections.    Take your temperature as your doctor or nurse tells you, and whenever you feel like you may have a fever.    To help decrease the risk of bleeding, use a soft  toothbrush. Check with your nurse before using dental floss.    Be very careful when using knives or tools.    Use an electric shaver instead of a razor.    Drink plenty of fluids (a minimum of eight glasses per day is recommended).    Mouth care is very important. Your mouth care should consist of routine, gentle cleaning of your teeth or dentures and rinsing your mouth with a mixture of 1/2 teaspoon of salt in 8 ounces of water or 1/2 teaspoon of baking soda in 8 ounces of water. This should be done at least after each meal and at bedtime.    If you have mouth sores, avoid mouthwash that has alcohol. Also avoid alcohol and smoking because they can bother your mouth and throat.    To help with nausea and vomiting, eat small, frequent meals instead of three large meals a day. Choose foods and drinks that are at room temperature. Ask your nurse or doctor about other helpful tips and medicine that is available to help stop or lessen these symptoms.    If you throw up or have loose bowel movements, you should drink more fluids so that you do not become dehydrated (lack of water in the body from losing too much fluid).    If you have diarrhea, eat low-fiber foods that are high in protein and calories and avoid foods that can irritate your digestive tracts or lead to cramping.    If you are not able to move your bowels, check with your doctor or nurse before you use enemas, laxatives, or suppositories.    Ask your doctor or nurse about medicines that are available to help stop or lessen constipation and/or diarrhea.    Infusion reactions may happen after your infusion. If this happens, call 911 for emergency care.    To help with weight loss, drink fluids that contribute calories (whole milk, juice, soft drinks, sweetened beverages, milkshakes, and nutritional supplements) instead of water.    To help with decreased appetite, eat small, frequent meals. Eat foods high in calories and protein,  such as meat, poultry, fish, dry beans, tofu, eggs, nuts, milk, yogurt, cheese, ice cream, pudding, and nutritional supplements.    Consider using sauces and spices to increase taste. Daily exercise, with your doctor's approval, may increase your appetite.    Keeping your pain under control is important to your well-being. Please tell your doctor or nurse if you are experiencing pain.    If you get a rash do not put anything on it unless your doctor or nurse says you may. Keep the area around the rash clean and dry. Ask your doctor for medicine if your rash bothers you.   Food and Drug Interactions  There are no known interactions of bendamustine with food.    Check with your doctor or pharmacist about all other prescription medicines and over-the-counter medicines and dietary supplements (vitamins,  minerals, herbs and others) you are taking before starting this medicine as there are known drug interactions with bendamustine. Also, check with your doctor or pharmacist before starting any new prescription or over-the-counter medicines, or dietary supplements to make sure that there are no interactions.   When to Call the Doctor Call your doctor or nurse if you have any of these symptoms and/or any new or unusual symptoms:    Fever of 100.4 F (38 C) or higher    Chills    Tiredness that interferes with your daily activities    Feeling dizzy or lightheaded    Coughing up yellow, green, or bloody mucus    Wheezing or trouble breathing    Headache that does not go away    Easy bleeding or bruising    No bowel movement in 3 days or when you feel uncomfortable.    Diarrhea, 4 times in one day or diarrhea with lack of strength or a feeling of being dizzy    Pain in your mouth or throat that makes it hard to eat or drink    Nausea that stops you from eating or drinking and/or is not relieved by prescribed medicines    Throwing up    Lasting loss of appetite or rapid weight loss of  five pounds in a week    Flu-like symptoms: fever, headache, muscle and joint aches, and fatigue (low energy, feeling weak)    A new rash or a rash that is not relieved by prescribed medicines    Signs of allergic reaction: swelling of the face, feeling like your tongue or throat are swelling, trouble breathing, rash, itching, fever, chills, feeling dizzy, and/or feeling that your heart is beating in a fast or not normal way. If this happens, call 911 for emergency care.    Signs of infusion reaction: fever or shaking chills, flushing, facial swelling, feeling dizzy, headache, trouble breathing, rash, itching, chest tightness, or chest pain. If this happens, call 911 for emergency care.    While you are getting this drug, please tell your nurse right away if you have any pain, redness, or swelling at the site of the IV infusion.    Signs of possible liver problems: dark urine, pale bowel movements, bad stomach pain, feeling very tired and weak, unusual itching, or yellowing of the eyes or skin    Signs of tumor lysis: confusion or agitation, decreased urine, nausea/vomiting, diarrhea, muscle cramping, numbness and/or tingling, seizures    If you think you may be pregnant, or may have impregnated your partner   Reproduction Warnings    Pregnancy warning: This drug can have harmful effects on the unborn baby. Women of childbearing potential should use effective methods of birth control during your cancer treatment and for at least 6 months after treatment. Men with male partners of childbearing potential should use effective methods of birth control during your cancer treatment and for at least 3 months after your cancer treatment. Let your doctor know right away if you think you may be pregnant or may have impregnated your partner.    Breastfeeding warning: It is not known if this drug passes into breast milk. For this reason, women should not breastfeed during treatment and for at least 1  week after treatment because this drug could enter the breast milk and cause harm to a breastfeeding baby.    Fertility warning: In men this drug may affect your ability to have children in the future. Talk with your  doctor or nurse if you plan to have children. Ask for information on sperm banking.   SELF CARE ACTIVITIES WHILE ON CHEMOTHERAPY/IMMUNOTHERAPY:  Hydration Increase your fluid intake 48 hours prior to treatment and drink at least 8 to 12 cups (64 ounces) of water/decaffeinated beverages per day after treatment. You can still have your cup of coffee or soda but these beverages do not count as part of your 8 to 12 cups that you need to drink daily. No alcohol intake.  Medications Continue taking your normal prescription medication as prescribed.  If you start any new herbal or new supplements please let us know first to make sure it is safe.  Mouth Care Have teeth cleaned professionally before starting treatment. Keep dentures and partial plates clean. Use soft toothbrush and do not use mouthwashes that contain alcohol. Biotene is a good mouthwash that is available at most pharmacies or may be ordered by calling [phone]. Use warm salt water gargles (1 teaspoon salt per 1 quart warm water) before and after meals and at bedtime. Or you may rinse with 2 tablespoons of three-percent hydrogen peroxide mixed in eight ounces of water. If you are still having problems with your mouth or sores in your mouth please call the clinic. If you need dental work, please let the doctor know before you go for your appointment so that we can coordinate the best possible time for you in regards to your chemo regimen. You need to also let your dentist know that you are actively taking chemo. We may need to do labs prior to your dental appointment.  Skin Care Always use sunscreen that has not expired and with SPF (Sun Protection Factor) of 50 or higher. Wear hats to protect your head from the sun.  Remember to use sunscreen on your hands, ears, face, & feet.  Use good moisturizing lotions such as udder cream, eucerin, or even Vaseline. Some chemotherapies can cause dry skin, color changes in your skin and nails.    Avoid long, hot showers or baths. Use gentle, fragrance-free soaps and laundry detergent. Use moisturizers, preferably creams or ointments rather than lotions because the thicker consistency is better at preventing skin dehydration. Apply the cream or ointment within 15 minutes of showering. Reapply moisturizer at night, and moisturize your hands every time after you wash them.   Infection Prevention Please wash your hands for at least 30 seconds using warm soapy water. Handwashing is the #1 way to prevent the spread of germs. Stay away from sick people or people who are getting over a cold. If you develop respiratory systems such as green/yellow mucus production or productive cough or persistent cough let us know and we will see if you need an antibiotic. It is a good idea to keep a pair of gloves on when going into grocery stores/[employer] to decrease your risk of coming into contact with germs on the carts, etc. Carry alcohol hand gel with you at all times and use it frequently if out in public. If your temperature reaches 100.5 or higher please call the clinic and let us know.  If it is after hours or on the weekend please go to the ER if your temperature is over 100.4.  Please have your own personal thermometer at home to use.    Sex and bodily fluids If you are going to have sex, a condom must be used to protect the person that isn't taking immunotherapy. For a few days after treatment, immunotherapy can be  excreted through your bodily fluids.  When using the toilet please close the lid and flush the toilet twice.  Do this for a few day after you have had immunotherapy.   Contraception It is not known for sure whether or not immunotherapy drugs can be passed on through semen or  secretions from the vagina. Because of this some doctors advise people to use a barrier method if you have sex during treatment. This applies to vaginal, anal or oral sex.  Generally, doctors advise a barrier method only for the time you are actually having the treatment and for about a week after your treatment.  Advice like this can be worrying, but this does not mean that you have to avoid being intimate with your partner. You can still have close contact with your partner and continue to enjoy sex.  Animals If you have cats or birds we just ask that you not change the litter or change the cage.  Please have someone else do this for you while you are on immunotherapy.   Food Safety During and After Cancer Treatment Food safety is important for people both during and after cancer treatment. Cancer and cancer treatments, such as chemotherapy, radiation therapy, and stem cell/bone marrow transplantation, often weaken the immune system. This makes it harder for your body to protect itself from foodborne illness, also called food poisoning. Foodborne illness is caused by eating food that contains harmful bacteria, parasites, or viruses.  Foods to avoid Some foods have a higher risk of becoming tainted with bacteria. These include: Unwashed fresh fruit and vegetables, especially leafy vegetables that can hide dirt and other contaminants Raw sprouts, such as alfalfa sprouts Raw or undercooked beef, especially ground beef, or other raw or undercooked meat and poultry Fatty, fried, or spicy foods immediately before or after treatment.  These can sit heavy on your stomach and make you feel nauseous. Raw or undercooked shellfish, such as oysters. Sushi and sashimi, which often contain raw fish.  Unpasteurized beverages, such as unpasteurized fruit juices, raw milk, raw yogurt, or cider Undercooked eggs, such as soft boiled, over easy, and poached; raw, unpasteurized eggs; or foods made with raw egg,  such as homemade raw cookie dough and homemade mayonnaise  Simple steps for food safety  Shop smart. Do not buy food stored or displayed in an unclean area. Do not buy bruised or damaged fruits or vegetables. Do not buy cans that have cracks, dents, or bulges. Pick up foods that can spoil at the end of your shopping trip and store them in a cooler on the way home.  Prepare and clean up foods carefully. Rinse all fresh fruits and vegetables under running water, and dry them with a clean towel or paper towel. Clean the top of cans before opening them. After preparing food, wash your hands for 20 seconds with hot water and soap. Pay special attention to areas between fingers and under nails. Clean your utensils and dishes with hot water and soap. Disinfect your kitchen and cutting boards using 1 teaspoon of liquid, unscented bleach mixed into 1 quart of water.    Dispose of old food. Eat canned and packaged food before its expiration date (the "use by" or "best before" date). Consume refrigerated leftovers within 3 to 4 days. After that time, throw out the food. Even if the food does not smell or look spoiled, it still may be unsafe. Some bacteria, such as Listeria, can grow even on foods stored in the refrigerator  if they are kept for too long.  Take precautions when eating out. At restaurants, avoid buffets and salad bars where food sits out for a long time and comes in contact with many people. Food can become contaminated when someone with a virus, often a norovirus, or another "bug" handles it. Put any leftover food in a "to-go" container yourself, rather than having the server do it. And, refrigerate leftovers as soon as you get home. Choose restaurants that are clean and that are willing to prepare your food as you order it cooked.    SYMPTOMS TO REPORT AS SOON AS POSSIBLE AFTER TREATMENT:  FEVER GREATER THAN 100.4 F CHILLS WITH OR WITHOUT FEVER NAUSEA AND VOMITING THAT IS NOT  CONTROLLED WITH YOUR NAUSEA MEDICATION UNUSUAL SHORTNESS OF BREATH UNUSUAL BRUISING OR BLEEDING TENDERNESS IN MOUTH AND THROAT WITH OR WITHOUT PRESENCE OF ULCERS URINARY PROBLEMS BOWEL PROBLEMS UNUSUAL RASH     Wear comfortable clothing and clothing appropriate for easy access to any Portacath or PICC line. Let us know if there is anything that we can do to make your therapy better!   What to do if you need assistance after hours or on the weekends: CALL [phone].  HOLD on the line, do not hang up.  You will hear multiple messages but at the end you will be connected with a nurse triage line.  They will contact the doctor if necessary.  Most of the time they will be able to assist you.  Do not call the hospital operator.    I have been informed and understand all of the instructions given to me and have received a copy. I have been instructed to call the clinic [phone] or my family physician as soon as possible for continued medical care, if indicated. I do not have any more questions at this time but understand that I may call the [facility] or the Patient Navigator at [phone] during office hours should I have questions or need assistance in obtaining follow-up care.

## 2023-01-02 NOTE — Progress Notes (Unsigned)
Pharmacist Chemotherapy Monitoring - Initial Assessment    Anticipated start date: 01/07/23   The following has been reviewed per standard work regarding the patient's treatment regimen: The patient's diagnosis, treatment plan and drug doses, and organ/hematologic function Lab orders and baseline tests specific to treatment regimen  The treatment plan start date, drug sequencing, and pre-medications Prior authorization status  Patient's documented medication list, including drug-drug interaction screen and prescriptions for anti-emetics and supportive care specific to the treatment regimen The drug concentrations, fluid compatibility, administration routes, and timing of the medications to be used The patient's access for treatment and lifetime cumulative dose history, if applicable  The patient's medication allergies and previous infusion related reactions, if applicable   Changes made to treatment plan:  Requested by MD to add Udenyca to day 3 of treatment plans.  Follow up needed:  {RX follow up:25721}  Discontinue diphenhydramine from oncology treatment plan --> Add Quzyttir (cetirizine) 10 mg IVPush x 1 as premedication for oncology treatment plan and add famotidine 20 mg IVPB x 1 as premedication.  T.O. Dr Alphonzo Severance Ronnald Ramp, PharmD    Wynona Neat, [place], 01/02/2023  8:08 AM

## 2023-01-05 ENCOUNTER — Inpatient Hospital Stay: Payer: Medicare Other

## 2023-01-05 DIAGNOSIS — J3489 Other specified disorders of nose and nasal sinuses: Secondary | ICD-10-CM | POA: Diagnosis not present

## 2023-01-05 DIAGNOSIS — C8238 Follicular lymphoma grade IIIa, lymph nodes of multiple sites: Secondary | ICD-10-CM

## 2023-01-05 DIAGNOSIS — Z5111 Encounter for antineoplastic chemotherapy: Secondary | ICD-10-CM | POA: Diagnosis present

## 2023-01-05 DIAGNOSIS — Z79899 Other long term (current) drug therapy: Secondary | ICD-10-CM | POA: Diagnosis not present

## 2023-01-05 DIAGNOSIS — C8230 Follicular lymphoma grade IIIa, unspecified site: Secondary | ICD-10-CM | POA: Diagnosis present

## 2023-01-05 LAB — COMPREHENSIVE METABOLIC PANEL
ALT: 19 U/L (ref 0–44)
AST: 23 U/L (ref 15–41)
Albumin: 4 g/dL (ref 3.5–5.0)
Alkaline Phosphatase: 105 U/L (ref 38–126)
Anion gap: 11 (ref 5–15)
BUN: 15 mg/dL (ref 8–23)
CO2: 22 mmol/L (ref 22–32)
Calcium: 8.8 mg/dL — ABNORMAL LOW (ref 8.9–10.3)
Chloride: 97 mmol/L — ABNORMAL LOW (ref 98–111)
Creatinine, Ser: 1.06 mg/dL (ref 0.61–1.24)
GFR, Estimated: >60 mL/min (ref >60)
Glucose, Bld: 182 mg/dL — ABNORMAL HIGH (ref 70–99)
Potassium: 3.9 mmol/L (ref 3.5–5.1)
Sodium: 130 mmol/L — ABNORMAL LOW (ref 135–145)
Total Bilirubin: 0.7 mg/dL (ref 0.3–1.2)
Total Protein: 7.6 g/dL (ref 6.5–8.1)

## 2023-01-05 LAB — PHOSPHORUS: Phosphorus: 4 mg/dL (ref 2.5–4.6)

## 2023-01-05 LAB — CBC WITH DIFFERENTIAL/PLATELET
Abs Immature Granulocytes: 0.07 10*3/uL (ref 0.00–0.07)
Basophils Absolute: 0 10*3/uL (ref 0.0–0.1)
Basophils Relative: 0 %
Eosinophils Absolute: 0 10*3/uL (ref 0.0–0.5)
Eosinophils Relative: 0 %
HCT: 38.3 % — ABNORMAL LOW (ref 39.0–52.0)
Hemoglobin: 13.1 g/dL (ref 13.0–17.0)
Immature Granulocytes: 1 %
Lymphocytes Relative: 11 %
Lymphs Abs: 1.4 10*3/uL (ref 0.7–4.0)
MCH: 30.3 pg (ref 26.0–34.0)
MCHC: 34.2 g/dL (ref 30.0–36.0)
MCV: 88.5 fL (ref 80.0–100.0)
Monocytes Absolute: 1.8 10*3/uL — ABNORMAL HIGH (ref 0.1–1.0)
Monocytes Relative: 15 %
Neutro Abs: 9.3 10*3/uL — ABNORMAL HIGH (ref 1.7–7.7)
Neutrophils Relative %: 73 %
Platelets: 279 10*3/uL (ref 150–400)
RBC: 4.33 MIL/uL (ref 4.22–5.81)
RDW: 13.5 % (ref 11.5–15.5)
WBC: 12.7 10*3/uL — ABNORMAL HIGH (ref 4.0–10.5)
nRBC: 0 % (ref 0.0–0.2)

## 2023-01-05 LAB — MAGNESIUM: Magnesium: 2.2 mg/dL (ref 1.7–2.4)

## 2023-01-05 LAB — LACTATE DEHYDROGENASE: LDH: 167 U/L (ref 98–192)

## 2023-01-05 LAB — URIC ACID: Uric Acid, Serum: 5.2 mg/dL (ref 3.7–8.6)

## 2023-01-05 MED ORDER — ALLOPURINOL 300 MG PO TABS: 300.0000 mg | ORAL_TABLET | Freq: Every day | ORAL | 3 refills | Status: AC

## 2023-01-05 MED ORDER — PROCHLORPERAZINE MALEATE 10 MG PO TABS: 10.0000 mg | ORAL_TABLET | Freq: Four times a day (QID) | ORAL | 1 refills | Status: DC | PRN

## 2023-01-05 MED ORDER — LIDOCAINE-PRILOCAINE 2.5-2.5 % EX CREA: TOPICAL_CREAM | CUTANEOUS | 3 refills | Status: DC

## 2023-01-06 ENCOUNTER — Inpatient Hospital Stay: Payer: Medicare Other

## 2023-01-06 ENCOUNTER — Ambulatory Visit (HOSPITAL_COMMUNITY): Payer: Medicare Other

## 2023-01-06 ENCOUNTER — Ambulatory Visit (HOSPITAL_COMMUNITY): Payer: Medicare Other | Admitting: Certified Registered Nurse Anesthetist

## 2023-01-06 ENCOUNTER — Encounter: Payer: Self-pay | Admitting: Hematology

## 2023-01-06 ENCOUNTER — Encounter (HOSPITAL_COMMUNITY): Payer: Self-pay | Admitting: Surgery

## 2023-01-06 ENCOUNTER — Other Ambulatory Visit: Payer: Self-pay

## 2023-01-06 ENCOUNTER — Ambulatory Visit (HOSPITAL_COMMUNITY)
Admission: RE | Admit: 2023-01-06 | Discharge: 2023-01-06 | Disposition: A | Discharge status: Home / Self Care | Payer: Medicare Other | Source: Ambulatory Visit | Attending: Surgery | Admitting: Surgery

## 2023-01-06 ENCOUNTER — Ambulatory Visit (HOSPITAL_BASED_OUTPATIENT_CLINIC_OR_DEPARTMENT_OTHER): Payer: Medicare Other | Admitting: Certified Registered Nurse Anesthetist

## 2023-01-06 ENCOUNTER — Encounter (HOSPITAL_COMMUNITY)
Admission: RE | Disposition: A | Discharge status: Home / Self Care | Payer: Self-pay | Source: Ambulatory Visit | Attending: Surgery

## 2023-01-06 DIAGNOSIS — E1122 Type 2 diabetes mellitus with diabetic chronic kidney disease: Secondary | ICD-10-CM | POA: Insufficient documentation

## 2023-01-06 DIAGNOSIS — I252 Old myocardial infarction: Secondary | ICD-10-CM | POA: Insufficient documentation

## 2023-01-06 DIAGNOSIS — I2510 Atherosclerotic heart disease of native coronary artery without angina pectoris: Secondary | ICD-10-CM

## 2023-01-06 DIAGNOSIS — I110 Hypertensive heart disease with heart failure: Secondary | ICD-10-CM

## 2023-01-06 DIAGNOSIS — R0600 Dyspnea, unspecified: Secondary | ICD-10-CM | POA: Diagnosis not present

## 2023-01-06 DIAGNOSIS — Z951 Presence of aortocoronary bypass graft: Secondary | ICD-10-CM | POA: Insufficient documentation

## 2023-01-06 DIAGNOSIS — C8220 Follicular lymphoma grade III, unspecified, unspecified site: Secondary | ICD-10-CM | POA: Diagnosis not present

## 2023-01-06 DIAGNOSIS — C8295 Follicular lymphoma, unspecified, lymph nodes of inguinal region and lower limb: Secondary | ICD-10-CM

## 2023-01-06 DIAGNOSIS — I130 Hypertensive heart and chronic kidney disease with heart failure and stage 1 through stage 4 chronic kidney disease, or unspecified chronic kidney disease: Secondary | ICD-10-CM | POA: Insufficient documentation

## 2023-01-06 DIAGNOSIS — I509 Heart failure, unspecified: Secondary | ICD-10-CM

## 2023-01-06 DIAGNOSIS — Z7901 Long term (current) use of anticoagulants: Secondary | ICD-10-CM | POA: Insufficient documentation

## 2023-01-06 DIAGNOSIS — I350 Nonrheumatic aortic (valve) stenosis: Secondary | ICD-10-CM | POA: Diagnosis not present

## 2023-01-06 DIAGNOSIS — N183 Chronic kidney disease, stage 3 unspecified: Secondary | ICD-10-CM | POA: Insufficient documentation

## 2023-01-06 DIAGNOSIS — I5032 Chronic diastolic (congestive) heart failure: Secondary | ICD-10-CM | POA: Diagnosis not present

## 2023-01-06 HISTORY — PX: PORTACATH PLACEMENT: SHX2246

## 2023-01-06 LAB — GLUCOSE, CAPILLARY: Glucose-Capillary: 205 mg/dL — ABNORMAL HIGH (ref 70–99)

## 2023-01-06 SURGERY — INSERTION, TUNNELED CENTRAL VENOUS DEVICE, WITH PORT
Anesthesia: General | Site: Chest | Laterality: Right

## 2023-01-06 MED ORDER — LIDOCAINE HCL (PF) 2 % IJ SOLN
INTRAMUSCULAR | Status: AC
  Filled 2023-01-06: qty 5

## 2023-01-06 MED ORDER — PROPOFOL 10 MG/ML IV BOLUS
INTRAVENOUS | Status: DC | PRN
  Administered 2023-01-06: 10 mg via INTRAVENOUS
  Administered 2023-01-06: 20 mg via INTRAVENOUS

## 2023-01-06 MED ORDER — HEPARIN SOD (PORK) LOCK FLUSH 100 UNIT/ML IV SOLN
INTRAVENOUS | Status: DC | PRN
  Administered 2023-01-06: 500 [IU] via INTRAVENOUS

## 2023-01-06 MED ORDER — SODIUM CHLORIDE (PF) 0.9 % IJ SOLN
INTRAMUSCULAR | Status: DC | PRN
  Administered 2023-01-06: 500 mL

## 2023-01-06 MED ORDER — FENTANYL CITRATE (PF) 100 MCG/2ML IJ SOLN
INTRAMUSCULAR | Status: AC
  Filled 2023-01-06: qty 2

## 2023-01-06 MED ORDER — OXYCODONE HCL 5 MG PO TABS: 5.0000 mg | ORAL_TABLET | Freq: Once | ORAL | Status: DC | PRN

## 2023-01-06 MED ORDER — CEFAZOLIN SODIUM-DEXTROSE 2-4 GM/100ML-% IV SOLN
INTRAVENOUS | Status: AC
  Filled 2023-01-06: qty 100

## 2023-01-06 MED ORDER — FENTANYL CITRATE PF 50 MCG/ML IJ SOSY: 25.0000 ug | PREFILLED_SYRINGE | INTRAMUSCULAR | Status: DC | PRN

## 2023-01-06 MED ORDER — CHLORHEXIDINE GLUCONATE 0.12 % MT SOLN
15.0000 mL | Freq: Once | OROMUCOSAL | Status: AC
  Administered 2023-01-06: 15 mL via OROMUCOSAL

## 2023-01-06 MED ORDER — CHLORHEXIDINE GLUCONATE CLOTH 2 % EX PADS
6.0000 | MEDICATED_PAD | Freq: Once | CUTANEOUS | Status: AC
  Administered 2023-01-06: 6 via TOPICAL

## 2023-01-06 MED ORDER — LIDOCAINE HCL (PF) 1 % IJ SOLN
INTRAMUSCULAR | Status: AC
  Filled 2023-01-06: qty 30

## 2023-01-06 MED ORDER — CHLORHEXIDINE GLUCONATE CLOTH 2 % EX PADS: 6.0000 | MEDICATED_PAD | Freq: Once | CUTANEOUS | Status: DC

## 2023-01-06 MED ORDER — LIDOCAINE HCL (PF) 1 % IJ SOLN
INTRAMUSCULAR | Status: DC | PRN
  Administered 2023-01-06: 20 mL

## 2023-01-06 MED ORDER — APIXABAN 5 MG PO TABS: 5.0000 mg | ORAL_TABLET | Freq: Two times a day (BID) | ORAL | 5 refills | Status: DC

## 2023-01-06 MED ORDER — LACTATED RINGERS IV SOLN: INTRAVENOUS | Status: DC

## 2023-01-06 MED ORDER — ONDANSETRON HCL 4 MG/2ML IJ SOLN: 4.0000 mg | Freq: Once | INTRAMUSCULAR | Status: DC | PRN

## 2023-01-06 MED ORDER — PROPOFOL 500 MG/50ML IV EMUL
INTRAVENOUS | Status: DC | PRN
  Administered 2023-01-06: 50 ug/kg/min via INTRAVENOUS

## 2023-01-06 MED ORDER — OXYCODONE HCL 5 MG/5ML PO SOLN: 5.0000 mg | Freq: Once | ORAL | Status: DC | PRN

## 2023-01-06 MED ORDER — ORAL CARE MOUTH RINSE: 15.0000 mL | Freq: Once | OROMUCOSAL | Status: AC

## 2023-01-06 MED ORDER — CHLORHEXIDINE GLUCONATE 0.12 % MT SOLN
OROMUCOSAL | Status: AC
  Filled 2023-01-06: qty 15

## 2023-01-06 MED ORDER — LIDOCAINE HCL (CARDIAC) PF 100 MG/5ML IV SOSY
PREFILLED_SYRINGE | INTRAVENOUS | Status: DC | PRN
  Administered 2023-01-06: 60 mg via INTRAVENOUS

## 2023-01-06 MED ORDER — HEPARIN SOD (PORK) LOCK FLUSH 100 UNIT/ML IV SOLN
INTRAVENOUS | Status: AC
  Filled 2023-01-06: qty 5

## 2023-01-06 MED ORDER — FENTANYL CITRATE (PF) 100 MCG/2ML IJ SOLN
INTRAMUSCULAR | Status: DC | PRN
  Administered 2023-01-06 (×2): 25 ug via INTRAVENOUS

## 2023-01-06 MED ORDER — PROPOFOL 500 MG/50ML IV EMUL
INTRAVENOUS | Status: AC
  Filled 2023-01-06: qty 50

## 2023-01-06 MED ORDER — CEFAZOLIN SODIUM-DEXTROSE 2-4 GM/100ML-% IV SOLN
2.0000 g | INTRAVENOUS | Status: AC
  Administered 2023-01-06: 2 g via INTRAVENOUS

## 2023-01-06 SURGICAL SUPPLY — 35 items
ADH SKN CLS APL DERMABOND .7 (GAUZE/BANDAGES/DRESSINGS) ×1
APL PRP STRL LF ISPRP CHG 10.5 (MISCELLANEOUS) ×1
APPLICATOR CHLORAPREP 10.5 ORG (MISCELLANEOUS) ×1 IMPLANT
BAG DECANTER FOR FLEXI CONT (MISCELLANEOUS) ×1 IMPLANT
BLADE SURG SZ11 CARB STEEL (BLADE) ×1 IMPLANT
CLOTH BEACON ORANGE TIMEOUT ST (SAFETY) ×1 IMPLANT
COVER LIGHT HANDLE STERIS (MISCELLANEOUS) ×2 IMPLANT
COVER MAYO STAND XLG (MISCELLANEOUS) IMPLANT
COVER PROBE U/S 5X48 (MISCELLANEOUS) ×1 IMPLANT
DECANTER SPIKE VIAL GLASS SM (MISCELLANEOUS) ×1 IMPLANT
DERMABOND ADVANCED .7 DNX12 (GAUZE/BANDAGES/DRESSINGS) ×1 IMPLANT
DRAPE C-ARM FOLDED MOBILE STRL (DRAPES) ×1 IMPLANT
ELECT REM PT RETURN 9FT ADLT (ELECTROSURGICAL) ×1
ELECTRODE REM PT RTRN 9FT ADLT (ELECTROSURGICAL) ×1 IMPLANT
GLOVE BIOGEL PI IND STRL 6.5 (GLOVE) ×1 IMPLANT
GLOVE BIOGEL PI IND STRL 7.0 (GLOVE) ×2 IMPLANT
GLOVE SURG SS PI 6.5 STRL IVOR (GLOVE) ×1 IMPLANT
GLOVE SURG SS PI 7.0 STRL IVOR (GLOVE) IMPLANT
GOWN STRL REUS W/TWL LRG LVL3 (GOWN DISPOSABLE) ×2 IMPLANT
IV NS 500ML (IV SOLUTION) ×1
IV NS 500ML BAXH (IV SOLUTION) ×1 IMPLANT
KIT PORT POWER 8FR ISP MRI (Port) ×1 IMPLANT
KIT TURNOVER KIT A (KITS) ×1 IMPLANT
MANIFOLD NEPTUNE II (INSTRUMENTS) ×1 IMPLANT
NDL HYPO 25X1 1.5 SAFETY (NEEDLE) ×1 IMPLANT
NEEDLE HYPO 25X1 1.5 SAFETY (NEEDLE) ×1 IMPLANT
PACK MINOR (CUSTOM PROCEDURE TRAY) ×1 IMPLANT
PAD ARMBOARD 7.5X6 YLW CONV (MISCELLANEOUS) ×1 IMPLANT
SET BASIN LINEN APH (SET/KITS/TRAYS/PACK) ×1 IMPLANT
SUT MNCRL AB 4-0 PS2 18 (SUTURE) ×1 IMPLANT
SUT VIC AB 3-0 SH 27 (SUTURE) ×1
SUT VIC AB 3-0 SH 27X BRD (SUTURE) ×1 IMPLANT
SYR 10ML LL (SYRINGE) ×2 IMPLANT
SYR 5ML LL (SYRINGE) IMPLANT
SYR CONTROL 10ML LL (SYRINGE) ×1 IMPLANT

## 2023-01-06 NOTE — Transfer of Care (Signed)
Immediate Anesthesia Transfer of Care Note  Patient: Alejandro [name]  Procedure(s) Performed: INSERTION PORT-A-CATH (Right: Chest)  Patient Location: Short Stay  Anesthesia Type:MAC  Level of Consciousness: awake, alert , and oriented  Airway & Oxygen Therapy: Patient Spontanous Breathing  Post-op Assessment: Report given to RN and Post -op Vital signs reviewed and stable  Post vital signs: Reviewed and stable  Last Vitals:  Vitals Value Taken Time  BP    Temp    Pulse    Resp    SpO2      Last Pain:  Vitals:   01/06/23 0649  TempSrc: Oral  PainSc: 0-No pain         Complications: No notable events documented.

## 2023-01-06 NOTE — Anesthesia Preprocedure Evaluation (Signed)
Anesthesia Evaluation  Patient identified by MRN, date of birth, ID band Patient awake    Reviewed: Allergy & Precautions, H&P , NPO status , Patient's Chart, lab work & pertinent test results, reviewed documented beta blocker date and time   Airway Mallampati: II  TM Distance: >3 FB Neck ROM: full    Dental no notable dental hx.    Pulmonary neg pulmonary ROS   Pulmonary exam normal breath sounds clear to auscultation       Cardiovascular Exercise Tolerance: Good hypertension, + CAD, + Past MI, +CHF and + DOE  + Valvular Problems/Murmurs AS  Rhythm:regular Rate:Normal     Neuro/Psych  Headaches negative neurological ROS  negative psych ROS   GI/Hepatic negative GI ROS, Neg liver ROS,GERD  ,,  Endo/Other  negative endocrine ROSdiabetes    Renal/GU Renal diseasenegative Renal ROS  negative genitourinary   Musculoskeletal   Abdominal   Peds  Hematology negative hematology ROS (+)   Anesthesia Other Findings 1. Left ventricular ejection fraction, by estimation, is 50 to 55%. The  left ventricle has low normal function. The left ventricle has no regional  wall motion abnormalities. The left ventricular internal cavity size was  mildly dilated. There is abnormal   septal motion. Recommend obtaining Limited Echo with global longitudinal  strain.   2. Right ventricular systolic function is low normal. The right  ventricular size is normal.   3. The mitral valve is degenerative. Trivial mitral valve regurgitation.  No evidence of mitral stenosis.   4. The aortic valve is calcified. Aortic valve regurgitation is trivial.  Moderate aortic valve stenosis.   5. There is moderate dilatation of the aortic root, measuring 44 mm.   6. The inferior vena cava is normal in size with greater than 50%  respiratory variability, suggesting right atrial pressure of 3 mmHg.    Reproductive/Obstetrics negative OB ROS                               Anesthesia Physical Anesthesia Plan  ASA: 3  Anesthesia Plan: General   Post-op Pain Management:    Induction:   PONV Risk Score and Plan: Propofol infusion  Airway Management Planned:   Additional Equipment:   Intra-op Plan:   Post-operative Plan:   Informed Consent: I have reviewed the patients History and Physical, chart, labs and discussed the procedure including the risks, benefits and alternatives for the proposed anesthesia with the patient or authorized representative who has indicated his/her understanding and acceptance.     Dental Advisory Given  Plan Discussed with: CRNA  Anesthesia Plan Comments:         Anesthesia Quick Evaluation

## 2023-01-06 NOTE — Op Note (Signed)
Operative Note 01/06/23   Preoperative Diagnosis: Follicular Lymphoma   Postoperative Diagnosis: Same   Procedure(s) Performed: Right Internal Jugular Port-A-Cath placement with ultrasound guidance   Surgeon: Graciella Freer, DO    Assistants: No qualified resident was available   Anesthesia: Monitored anesthesia care   Anesthesiologist: [name], MD    Specimens: None   Estimated Blood Loss: Minimal   Fluoroscopy time: 31 seconds   Blood Replacement: None    Complications: None    Operative Findings: Appropriately positioned port-a-cath at the conclusion of the procedure  Indications: Patient is a 82 year old male who present for right IJ port-a-cath insertion.  He was recently diagnosed with follicular lymphoma after left inguinal lymph node biopsy.  He is agreeable to port-a-cath insertion.  All risks and benefits of performing this procedure were discussed with the patient including pain, infection, bleeding, damage to the surrounding structures, and need for more procedures or surgery. The patient voiced understanding of the procedure, all questions were sought and answered, and consent was obtained.  Procedure: The patient was brought into the operating room and anesthesia was induced.  One percent lidocaine was used for local anesthesia.   A time-out was completed verifying correct patient, procedure, site, positioning, and implant(s) and/or special equipment prior to beginning this procedure. The right chest and neck were prepped and draped in the usual sterile fashion.  Preoperative antibiotics were given.  Using ultrasound guidance, the track to the right IJ was localized.  Again using ultrasound guidance, the access needle was advanced into the right internal jugular vein using the Seldinger technique without difficulty. A guidewire was then advanced into the right atrium under fluoroscopic guidance.  Ectopia was not noted. The skin was knicked.  The pocket  location and tunneling track were localized.  An incision was made below the right clavicle.  A subcutaneous pocket was formed. The catheter was then tunneled from the pocket to the access site in the neck.  An introducer and peel-away sheath were placed over the guidewire. The catheter was then inserted through the peel-away sheath and the peel-away sheath was removed.  A spot film was performed to confirm the position. The catheter was then attached to the port and the port placed in subcutaneous pocket. Adequate positioning was confirmed by fluoroscopy. Hemostasis was confirmed.  Good backflow of blood was noted on aspiration of the port. The port was flushed with heparin flush. Subcutaneous layer was reapproximated using a 3-0 Vicryl interrupted suture. The skin was closed using a 4-0 Vicryl subcuticular suture. Dermabond was applied.    All tape and needle counts were correct at the end of the procedure. The patient was transferred to PACU in stable condition. A chest x-ray will be performed at that time.  Graciella Freer, DO  [facility] [address] [place], [place] [address] [phone] (office)

## 2023-01-06 NOTE — H&P (Addendum)
Alejandro [name] History and Physical  Reason for Referral:Alejandro [name] Referring Physician: Dr. Charlies Constable Alejandro [name] is a 82 y.o. male.  HPI: Patient is a 82 year old male who presents for left inguinal lymphadenopathy. He has had 6 months of shortness of breath and decreased energy. He denies any fevers, chills, or night sweats. He has been seen by Dr. Delton Coombes for generalized lymphadenopathy, which was PET positive. He underwent a retroperitoneal lymph node biopsy in November, which demonstrated atypical lymphoid proliferation concerning for lymphoproliferative disorder. He subsequently underwent video bronchoscopy with endobronchial ultrasound and biopsies of enlarged mediastinal lymph nodes. 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 He denies ever feeling any lymphadenopathy. His past medical history is significant for diabetes, hyperlipidemia, hypertension, coronary artery disease with an MI status post CABG. He is currently taking Eliquis. He denies any history of abdominal surgeries. He denies use of tobacco products, alcohol, and illicit drugs.   Patient underwent left inguinal lymph node biopsy on 1/15, which came back positive for follicular lymphoma.  He now requires port a cath [name] for chemotherapy.  Past Medical History:  Diagnosis Date  . A-fib ([place])   . AAA Ultrasound 01/11/2020    AAA [place] 01/11/20:  no abdominal aortic aneurysm, 2.4 cm  . Aortic insufficiency    a. mild-mod by intraop TEE 05/2018 (no aortic stenosis) // Echo 2/21: mild //  Echo 9/22: trivial  . Aortic stenosis    Echo 9/22: mild (mean 16) // Echo 2/21: mild (mean 18.7)  . CAD (coronary artery disease)    a. NSTEMI 05/2018 with multivessel disease -> s/p CABGx4 05/11/18 // Myoview 06/07/20: inf scar w mild peri-infarct ischemia, EF 37; Int Risk  . Carotid artery disease ([place])    PreCABG [place] 05/07/18: Bilat ICA 1-39  . CKD (chronic kidney disease), stage III ([place])    . HFimpEF (heart failure with improved EF)    Echocardiogram 01/11/20: EF 45, Gr 3 DD, mild AI, mild AS (mean 18.7 mmHg) // Echocardiogram 08/14/21:  EF 55-60, inf HK, mild LVH, Gr 1 DD, normal RVSF, RVSP 25.3, mild LAE, trivial MR, MAC, trivial AI, mild AS (mean 16 mmHg, Vmax 283 cm/s, DI 0.41)  . History of nephrolithiasis 2004  . Hyperlipidemia   . Hypertension   . IBS (irritable colon syndrome)   . Ischemic cardiomyopathy   . Mitral regurgitation    Echo 9/22: trivial MR  . Postoperative atrial fibrillation ([place])   . PVC's (premature ventricular contractions)   . Reflux   . Type 2 diabetes mellitus ([place])     Past Surgical History:  Procedure Laterality Date  . BRONCHIAL NEEDLE ASPIRATION BIOPSY  11/17/2022   Procedure: BRONCHIAL NEEDLE ASPIRATION BIOPSIES;  Surgeon: Collene Gobble, MD;  [facility];  Service: Cardiopulmonary;;  . CARDIOVERSION    . CORONARY ARTERY BYPASS GRAFT N/A 05/11/2018   Procedure: CORONARY ARTERY BYPASS GRAFTING (CABG), on pump, times four, using left internal mammary artery and endoscopically harvested right greater saphenous leg vein.;  Surgeon: Ivin Poot, MD;  Location: [facility];  Service: Open Heart Surgery;  Laterality: N/A;  . ESOPHAGOGASTRODUODENOSCOPY  02/04/2012   Procedure: ESOPHAGOGASTRODUODENOSCOPY (EGD);  Surgeon: Rogene Houston, MD;  [facility];  Service: Endoscopy;  Laterality: N/A;  100  . INGUINAL LYMPH NODE BIOPSY Left 12/15/2022   Procedure: INGUINAL LYMPH NODE BIOPSY;  Surgeon: Graciella Freer A, DO;  Location: [facility];  Service: General;  Laterality: Left;  . LEFT HEART  CATH AND CORONARY ANGIOGRAPHY N/A 05/04/2018   Procedure: LEFT HEART CATH AND CORONARY ANGIOGRAPHY;  Surgeon: Belva Crome, MD;  Location: [facility];  Service: Cardiovascular;  Laterality: N/A;  . NASAL FRACTURE SURGERY    . TEE WITHOUT CARDIOVERSION N/A 05/11/2018   Procedure: TRANSESOPHAGEAL ECHOCARDIOGRAM (TEE);  Surgeon: Prescott Gum, Collier Salina, MD;  Location: [facility];  Service: Open Heart Surgery;  Laterality: N/A;  . VIDEO BRONCHOSCOPY WITH ENDOBRONCHIAL ULTRASOUND Right 11/17/2022   Procedure: VIDEO BRONCHOSCOPY WITH ENDOBRONCHIAL ULTRASOUND;  Surgeon: Collene Gobble, MD;  Location: [facility];  Service: Cardiopulmonary;  Laterality: Right;    Family History  Problem Relation Age of Onset  . Diabetes Mellitus II Mother   . Heart attack Father   . Heart attack Brother   . Heart disease Other   . Arthritis Other   . Asthma Other   . Diabetes Other     Social History   Tobacco Use  . Smoking status: Never  . Smokeless tobacco: Never  Vaping Use  . Vaping Use: Never used  Substance Use Topics  . Alcohol use: No    Alcohol/week: 0.0 standard drinks of alcohol  . Drug use: No    Medications: I have reviewed the patient's current medications. Allergies as of 01/06/2023   No Known Allergies       ROS:  Pertinent items are noted in HPI.  Blood pressure (!) 146/74, pulse 72, temperature 98.2 F (36.8 C), temperature source Oral, resp. rate 15, height 5\' 11"  (1.803 m), weight 108.5 kg, SpO2 97 %. Physical Exam Vitals reviewed.  Constitutional:      Appearance: Normal appearance.  HENT:     Head: Normocephalic and atraumatic.  Eyes:     Extraocular Movements: Extraocular movements intact.     Pupils: Pupils are equal, round, and reactive to light.  Cardiovascular:     Rate and Rhythm: Normal rate.  Pulmonary:     Effort: Pulmonary effort is normal.  Abdominal:     General: There is no distension.     Palpations: Abdomen is soft.     Tenderness: There is no abdominal tenderness.  Musculoskeletal:     Cervical back: Normal range of motion. No rigidity or tenderness.  Skin:    General: Skin is warm and dry.     Comments: Well healing left inguinal incision  Neurological:     General: No focal deficit present.     Mental Status: He is alert and oriented to person, place, and time.   Psychiatric:        Mood and Affect: Mood normal.        Behavior: Behavior normal.   Results: Results for orders placed or performed during the hospital encounter of 01/06/23 (from the past 48 hour(s))  Glucose, capillary     Status: Abnormal   Collection Time: 01/06/23  6:47 AM  Result Value Ref Range   Glucose-Capillary 205 (H) 70 - 99 mg/dL    Comment: Glucose reference range applies only to samples taken after fasting for at least 8 hours.    No results found.   Assessment & Plan:  BREAKER SPRINGER is a 82 y.o. male who was recently diagnosed with follicular lymphoma.  Requires Alejandro [name].  -The risk and benefits of right IJ Alejandro [name] were discussed including but not limited to bleeding, infection, injury to surrounding structures, pneumothorax, and need for additional procedures.  After careful consideration, SHAKIL DIRK has decided to proceed with  surgery.  -Will plan for surgery today -Patient held Eliquis for procedure  All questions were answered to the satisfaction of the patient.  Graciella Freer, DO [name] [address] [place], [place] [address] [phone] (office)

## 2023-01-06 NOTE — Progress Notes (Signed)
Chemotherapy education packet given and discussed with pt in detail.  Discussed diagnosis and staging, tx regimen, and intent of tx.  Reviewed chemotherapy medications and side effects, as well as pre-medications.  Instructed on how to manage side effects at home, and when to call the clinic.  Importance of fever/chills discussed with pt. Discussed precautions to implement at home after receiving tx, as well as self care strategies. Phone numbers provided for clinic during regular working hours, also how to reach the clinic after hours and on weekends. Pt provided the opportunity to ask questions - all questions answered to pt's satisfaction.  

## 2023-01-06 NOTE — Discharge Instructions (Addendum)
Ambulatory Surgery Discharge Instructions  General Anesthesia or Sedation Do not drive or operate heavy machinery for 24 hours.  Do not consume alcohol, tranquilizers, sleeping medications, or any non-prescribed medications for 24 hours. Do not make important decisions or sign any important papers in the next 24 hours. You should have someone with you tonight at home.  Activity  You are advised to go directly home from the hospital.  Restrict your activities and rest for a day.  Resume light activity tomorrow. No heavy lifting over 10 lbs or strenuous exercise.  Fluids and Diet Begin with clear liquids, bouillon, dry toast, soda crackers.  If not nauseated, you may go to a regular diet when you desire.  Greasy and spicy foods are not advised.  Medications  If you have not had a bowel movement in 24 hours, take 2 tablespoons over the counter Milk of mag.             You May resume your blood thinners tomorrow (Aspirin, coumadin, or other).   Operative Site  You have a liquid bandage over your incisions, this will begin to flake off in about a week. Ok to Games developer. Keep wound clean and dry. No baths or swimming. No lifting more than 10 pounds.  Contact Information: If you have questions or concerns, please call our office, [phone], Monday- Thursday 8AM-5PM and Friday 8AM-12Noon.  If it is after hours or on the weekend, please call [facility] Main Number, [phone], and ask to speak to the surgeon on call for Dr. Okey Dupre at [facility].   SPECIFIC COMPLICATIONS TO WATCH FOR: Inability to urinate Fever over 101? F by mouth Nausea and vomiting lasting longer than 24 hours. Pain not relieved by medication ordered Swelling around the operative site Increased redness, warmth, hardness, around operative area Numbness, tingling, or cold fingers or toes Blood -soaked dressing, (small amounts of oozing may be normal) Increasing and progressive drainage from surgical area or exam  site

## 2023-01-06 NOTE — Progress Notes (Signed)
[facility]  Spoke with the patient's grandson in the consultation room.  I explained that he tolerated the procedure without difficulty.  He has dissolvable stitches under the skin with overlying skin glue.  This will flake off in 10 to 14 days.  He should take over the counter Tylenol or Advil as needed for pain.  The patient will follow-up with me in 2 weeks for phone follow-up.  All questions were answered to his expressed satisfaction.  Graciella Freer, DO [facility] [address] [place], [place] [address] [phone] (office)

## 2023-01-07 ENCOUNTER — Inpatient Hospital Stay: Payer: Medicare Other

## 2023-01-07 ENCOUNTER — Other Ambulatory Visit: Payer: Medicare Other

## 2023-01-07 VITALS — BP 116/51 | HR 71 | Temp 96.4°F | Resp 18 | Wt 234.4 lb

## 2023-01-07 DIAGNOSIS — C8238 Follicular lymphoma grade IIIa, lymph nodes of multiple sites: Secondary | ICD-10-CM

## 2023-01-07 DIAGNOSIS — Z5111 Encounter for antineoplastic chemotherapy: Secondary | ICD-10-CM | POA: Diagnosis not present

## 2023-01-07 MED ORDER — ACETAMINOPHEN 325 MG PO TABS
650.0000 mg | ORAL_TABLET | Freq: Once | ORAL | Status: AC
  Administered 2023-01-07: 650 mg via ORAL
  Filled 2023-01-07: qty 2

## 2023-01-07 MED ORDER — CETIRIZINE HCL 10 MG/ML IV SOLN
10.0000 mg | Freq: Once | INTRAVENOUS | Status: AC
  Administered 2023-01-07: 10 mg via INTRAVENOUS
  Filled 2023-01-07: qty 1

## 2023-01-07 MED ORDER — SODIUM CHLORIDE 0.9 % IV SOLN: 375.0000 mg/m2 | Freq: Once | INTRAVENOUS | Status: DC

## 2023-01-07 MED ORDER — PALONOSETRON HCL INJECTION 0.25 MG/5ML
0.2500 mg | Freq: Once | INTRAVENOUS | Status: AC
  Administered 2023-01-07: 0.25 mg via INTRAVENOUS
  Filled 2023-01-07: qty 5

## 2023-01-07 MED ORDER — SODIUM CHLORIDE 0.9 % IV SOLN
10.0000 mg | Freq: Once | INTRAVENOUS | Status: AC
  Administered 2023-01-07: 10 mg via INTRAVENOUS
  Filled 2023-01-07: qty 10

## 2023-01-07 MED ORDER — SODIUM CHLORIDE 0.9 % IV SOLN: Freq: Once | INTRAVENOUS | Status: AC

## 2023-01-07 MED ORDER — HEPARIN SOD (PORK) LOCK FLUSH 100 UNIT/ML IV SOLN
500.0000 [IU] | Freq: Once | INTRAVENOUS | Status: AC | PRN
  Administered 2023-01-07: 500 [IU]

## 2023-01-07 MED ORDER — DIPHENHYDRAMINE HCL 25 MG PO CAPS: 50.0000 mg | ORAL_CAPSULE | Freq: Once | ORAL | Status: DC

## 2023-01-07 MED ORDER — SODIUM CHLORIDE 0.9% FLUSH
10.0000 mL | INTRAVENOUS | Status: DC | PRN
  Administered 2023-01-07: 10 mL

## 2023-01-07 MED ORDER — SODIUM CHLORIDE 0.9 % IV SOLN
50.0000 mg/m2 | Freq: Once | INTRAVENOUS | Status: AC
  Administered 2023-01-07: 117.5 mg via INTRAVENOUS
  Filled 2023-01-07: qty 4.7

## 2023-01-07 MED ORDER — SODIUM CHLORIDE 0.9 % IV SOLN
375.0000 mg/m2 | Freq: Once | INTRAVENOUS | Status: AC
  Administered 2023-01-07: 900 mg via INTRAVENOUS
  Filled 2023-01-07: qty 50

## 2023-01-07 MED ORDER — FAMOTIDINE IN NACL 20-0.9 MG/50ML-% IV SOLN
20.0000 mg | Freq: Once | INTRAVENOUS | Status: AC
  Administered 2023-01-07: 20 mg via INTRAVENOUS
  Filled 2023-01-07: qty 50

## 2023-01-07 NOTE — Progress Notes (Unsigned)
Pt presents today for C1D1 Rituximab and Bendamustine per provider's order. Vital signs and labs WNL for treatment. Okay to proceed with treatment today.  C1D1 Rituximab and Bendamustine given today per MD orders. Tolerated infusion without adverse affects. Vital signs stable. No complaints at this time. Discharged from clinic via wheelchair in stable condition. Alert and oriented x 3. F/U with [facility] as scheduled.

## 2023-01-07 NOTE — Patient Instructions (Signed)
[facility]  Discharge Instructions: Thank you for choosing [facility] to provide your oncology and hematology care.  If you have a lab appointment with the [facility], please come in thru the Main Entrance and check in at the main information desk.  Wear comfortable clothing and clothing appropriate for easy access to any Portacath or PICC line.   We strive to give you quality time with your provider. You may need to reschedule your appointment if you arrive late (15 or more minutes).  Arriving late affects you and other patients whose appointments are after yours.  Also, if you miss three or more appointments without notifying the office, you may be dismissed from the clinic at the provider's discretion.      For prescription refill requests, have your pharmacy contact our office and allow 72 hours for refills to be completed.    Today you received the following chemotherapy and/or immunotherapy agents Rituxan and Bendeka   To help prevent nausea and vomiting after your treatment, we encourage you to take your nausea medication as directed.  Rituximab Injection What is this medication? RITUXIMAB (ri TUX i mab) treats leukemia and lymphoma. It works by blocking a protein that causes cancer cells to grow and multiply. This helps to slow or stop the spread of cancer cells. It may also be used to treat autoimmune conditions, such as arthritis. It works by slowing down an overactive immune system. It is a monoclonal antibody. This medicine may be used for other purposes; ask your health care provider or pharmacist if you have questions. COMMON BRAND NAME(S): RIABNI, Rituxan, RUXIENCE, truxima What should I tell my care team before I take this medication? They need to know if you have any of these conditions: Chest pain Heart disease Immune system problems Infection, such as chickenpox, cold sores, hepatitis B, herpes Irregular heartbeat or rhythm Kidney  disease Low blood counts, such as low white cells, platelets, red cells Lung disease Recent or upcoming vaccine An unusual or allergic reaction to rituximab, other medications, foods, dyes, or preservatives Pregnant or trying to get pregnant Breast-feeding How should I use this medication? This medication is injected into a vein. It is given by a care team in a hospital or clinic setting. A special MedGuide will be given to you before each treatment. Be sure to read this information carefully each time. Talk to your care team about the use of this medication in children. While this medication may be prescribed for children as young as 6 months for selected conditions, precautions do apply. Overdosage: If you think you have taken too much of this medicine contact a poison control center or emergency room at once. NOTE: This medicine is only for you. Do not share this medicine with others. What if I miss a dose? Keep appointments for follow-up doses. It is important not to miss your dose. Call your care team if you are unable to keep an appointment. What may interact with this medication? Do not take this medication with any of the following: Live vaccines This medication may also interact with the following: Cisplatin This list may not describe all possible interactions. Give your health care provider a list of all the medicines, herbs, non-prescription drugs, or dietary supplements you use. Also tell them if you smoke, drink alcohol, or use illegal drugs. Some items may interact with your medicine. What should I watch for while using this medication? Your condition will be monitored carefully while you  are receiving this medication. You may need blood work while taking this medication. This medication can cause serious infusion reactions. To reduce the risk your care team may give you other medications to take before receiving this one. Be sure to follow the directions from your care  team. This medication may increase your risk of getting an infection. Call your care team for advice if you get a fever, chills, sore throat, or other symptoms of a cold or flu. Do not treat yourself. Try to avoid being around people who are sick. Call your care team if you are around anyone with measles, chickenpox, or if you develop sores or blisters that do not heal properly. Avoid taking medications that contain aspirin, acetaminophen, ibuprofen, naproxen, or ketoprofen unless instructed by your care team. These medications may hide a fever. This medication may cause serious skin reactions. They can happen weeks to months after starting the medication. Contact your care team right away if you notice fevers or flu-like symptoms with a rash. The rash may be red or purple and then turn into blisters or peeling of the skin. You may also notice a red rash with swelling of the face, lips, or lymph nodes in your neck or under your arms. In some patients, this medication may cause a serious brain infection that may cause death. If you have any problems seeing, thinking, speaking, walking, or standing, tell your care team right away. If you cannot reach your care team, urgently seek another source of medical care. Talk to your care team if you may be pregnant. Serious birth defects can occur if you take this medication during pregnancy and for 12 months after the last dose. You will need a negative pregnancy test before starting this medication. Contraception is recommended while taking this medication and for 12 months after the last dose. Your care team can help you find the option that works for you. Do not breastfeed while taking this medication and for at least 6 months after the last dose. What side effects may I notice from receiving this medication? Side effects that you should report to your care team as soon as possible: Allergic reactions or angioedema--skin rash, itching or hives, swelling of the  face, eyes, lips, tongue, arms, or legs, trouble swallowing or breathing Bowel blockage--stomach cramping, unable to have a bowel movement or pass gas, loss of appetite, vomiting Dizziness, loss of balance or coordination, confusion or trouble speaking Heart attack--pain or tightness in the chest, shoulders, arms, or jaw, nausea, shortness of breath, cold or clammy skin, feeling faint or lightheaded Heart rhythm changes--fast or irregular heartbeat, dizziness, feeling faint or lightheaded, chest pain, trouble breathing Infection--fever, chills, cough, sore throat, wounds that don't heal, pain or trouble when passing urine, general feeling of discomfort or being unwell Infusion reactions--chest pain, shortness of breath or trouble breathing, feeling faint or lightheaded Kidney injury--decrease in the amount of urine, swelling of the ankles, hands, or feet Liver injury--right upper belly pain, loss of appetite, nausea, light-colored stool, dark yellow or brown urine, yellowing skin or eyes, unusual weakness or fatigue Redness, blistering, peeling, or loosening of the skin, including inside the mouth Stomach pain that is severe, does not go away, or gets worse Tumor lysis syndrome (TLS)--nausea, vomiting, diarrhea, decrease in the amount of urine, dark urine, unusual weakness or fatigue, confusion, muscle pain or cramps, fast or irregular heartbeat, joint pain Side effects that usually do not require medical attention (report to your care team if they continue  or are bothersome): Headache Joint pain Nausea Runny or stuffy nose Unusual weakness or fatigue This list may not describe all possible side effects. Call your doctor for medical advice about side effects. You may report side effects to FDA at 1-800-FDA-1088. Where should I keep my medication? This medication is given in a hospital or clinic. It will not be stored at home. NOTE: This sheet is a summary. It may not cover all possible  information. If you have questions about this medicine, talk to your doctor, pharmacist, or health care provider.  2023 Elsevier/Gold Standard (2022-04-01 00:00:00)  Bendamustine Injection What is this medication? BENDAMUSTINE (BEN da MUS teen) treats leukemia and lymphoma. It works by slowing down the growth of cancer cells. This medicine may be used for other purposes; ask your health care provider or pharmacist if you have questions. COMMON BRAND NAME(S): Oren Beckmann, VIVIMUSTA What should I tell my care team before I take this medication? They need to know if you have any of these conditions: Infection, especially a viral infection, such as chickenpox, cold sores, herpes Kidney disease Liver disease An unusual or allergic reaction to bendamustine, mannitol, other medications, foods, dyes, or preservatives Pregnant or trying to get pregnant Breast-feeding How should I use this medication? This medication is injected into a vein. It is given by your care team in a hospital or clinic setting. Talk to your care team about the use of this medication in children. Special care may be needed. Overdosage: If you think you have taken too much of this medicine contact a poison control center or emergency room at once. NOTE: This medicine is only for you. Do not share this medicine with others. What if I miss a dose? Keep appointments for follow-up doses. It is important not to miss your dose. Call your care team if you are unable to keep an appointment. What may interact with this medication? Do not take this medication with any of the following: Clozapine This medication may also interact with the following: Atazanavir Cimetidine Ciprofloxacin Enoxacin Fluvoxamine Medications for seizures, such as carbamazepine, phenobarbital Mexiletine Rifampin Tacrine Thiabendazole Zileuton This list may not describe all possible interactions. Give your health care provider a list of  all the medicines, herbs, non-prescription drugs, or dietary supplements you use. Also tell them if you smoke, drink alcohol, or use illegal drugs. Some items may interact with your medicine. What should I watch for while using this medication? Visit your care team for regular checks on your progress. This medication may make you feel generally unwell. This is not uncommon, as chemotherapy can affect healthy cells as well as cancer cells. Report any side effects. Continue your course of treatment even though you feel ill unless your care team tells you to stop. You may need blood work while taking this medication. This medication may increase your risk of getting an infection. Call your care team for advice if you get a fever, chills, sore throat, or other symptoms of a cold or flu. Do not treat yourself. Try to avoid being around people who are sick. This medication may cause serious skin reactions. They can happen weeks to months after starting the medication. Contact your care team right away if you notice fevers or flu-like symptoms with a rash. The rash may be red or purple and then turn into blisters or peeling of the skin. You may also notice a red rash with swelling of the face, lips, or lymph nodes in your neck or under  your arms. In some patients, this medication may cause a serious brain infection that may cause death. If you have any problems seeing, thinking, speaking, walking, or standing, tell your care team right away. If you cannot reach your care team, urgently seek other source of medical care. This medication may increase your risk to bruise or bleed. Call your care team if you notice any unusual bleeding. Talk to your care team about your risk of cancer. You may be more at risk for certain types of cancer if you take this medication. Talk to your care team about your risk of skin cancer. You may be more at risk for skin cancer if you take this medication. Talk to your care team if you  or your partner wish to become pregnant or think either of you might be pregnant. This medication can cause serious birth defects if taken during pregnancy or for up to 6 months after the last dose. A negative pregnancy test is required before starting this medication. A reliable form of contraception is recommended while taking this medication and for 6 months after the last dose. Talk to your care team about reliable forms of contraception. Wear a condom while taking this medication and for at least 3 months after the last dose. Do not breast-feed while taking this medication or for at least 1 week after the last dose. This medication may cause infertility. Talk to your care team if you are concerned about your fertility. What side effects may I notice from receiving this medication? Side effects that you should report to your care team as soon as possible: Allergic reactions--skin rash, itching, hives, swelling of the face, lips, tongue, or throat Infection--fever, chills, cough, sore throat, wounds that don't heal, pain or trouble when passing urine, general feeling of discomfort or being unwell Infusion reactions--chest pain, shortness of breath or trouble breathing, feeling faint or lightheaded Liver injury--right upper belly pain, loss of appetite, nausea, light-colored stool, dark yellow or brown urine, yellowing skin or eyes, unusual weakness or fatigue Low red blood cell level--unusual weakness or fatigue, dizziness, headache, trouble breathing Painful swelling, warmth, or redness of the skin, blisters or sores at the infusion site Rash, fever, and swollen lymph nodes Redness, blistering, peeling, or loosening of the skin, including inside the mouth Tumor lysis syndrome (TLS)--nausea, vomiting, diarrhea, decrease in the amount of urine, dark urine, unusual weakness or fatigue, confusion, muscle pain or cramps, fast or irregular heartbeat, joint pain Unusual bruising or bleeding Side effects  that usually do not require medical attention (report to your care team if they continue or are bothersome): Diarrhea Fatigue Headache Loss of appetite Nausea Vomiting This list may not describe all possible side effects. Call your doctor for medical advice about side effects. You may report side effects to FDA at 1-800-FDA-1088. Where should I keep my medication? This medication is given in a hospital or clinic. It will not be stored at home. NOTE: This sheet is a summary. It may not cover all possible information. If you have questions about this medicine, talk to your doctor, pharmacist, or health care provider.  2023 Elsevier/Gold Standard (2022-02-24 00:00:00)    BELOW ARE SYMPTOMS THAT SHOULD BE REPORTED IMMEDIATELY: *FEVER GREATER THAN 100.4 F (38 C) OR HIGHER *CHILLS OR SWEATING *NAUSEA AND VOMITING THAT IS NOT CONTROLLED WITH YOUR NAUSEA MEDICATION *UNUSUAL SHORTNESS OF BREATH *UNUSUAL BRUISING OR BLEEDING *URINARY PROBLEMS (pain or burning when urinating, or frequent urination) *BOWEL PROBLEMS (unusual diarrhea, constipation, pain near the  anus) TENDERNESS IN MOUTH AND THROAT WITH OR WITHOUT PRESENCE OF ULCERS (sore throat, sores in mouth, or a toothache) UNUSUAL RASH, SWELLING OR PAIN  UNUSUAL VAGINAL DISCHARGE OR ITCHING   Items with * indicate a potential emergency and should be followed up as soon as possible or go to the Emergency Department if any problems should occur.  Please show the CHEMOTHERAPY ALERT CARD or IMMUNOTHERAPY ALERT CARD at check-in to the Emergency Department and triage nurse.  Should you have questions after your visit or need to cancel or reschedule your appointment, please contact [facility] [phone]  and follow the prompts.  Office hours are 8:00 a.m. to 4:30 p.m. Monday - Friday. Please note that voicemails left after 4:00 p.m. may not be returned until the following business day.  We are closed weekends and major  holidays. You have access to a nurse at all times for urgent questions. Please call the main number to the clinic [phone] and follow the prompts.  For any non-urgent questions, you may also contact your provider using MyChart. We now offer e-Visits for anyone 24 and older to request care online for non-urgent symptoms. For details visit [email or web address].   Also download the MyChart app! Go to the app store, search "MyChart", open the app, select [facility], and log in with your MyChart username and password.

## 2023-01-08 ENCOUNTER — Inpatient Hospital Stay: Payer: Medicare Other

## 2023-01-08 VITALS — BP 132/64 | HR 51 | Temp 96.9°F | Resp 16

## 2023-01-08 DIAGNOSIS — Z5111 Encounter for antineoplastic chemotherapy: Secondary | ICD-10-CM | POA: Diagnosis not present

## 2023-01-08 DIAGNOSIS — C8238 Follicular lymphoma grade IIIa, lymph nodes of multiple sites: Secondary | ICD-10-CM

## 2023-01-08 MED ORDER — SODIUM CHLORIDE 0.9 % IV SOLN
50.0000 mg/m2 | Freq: Once | INTRAVENOUS | Status: AC
  Administered 2023-01-08: 117.5 mg via INTRAVENOUS
  Filled 2023-01-08: qty 4.7

## 2023-01-08 MED ORDER — SODIUM CHLORIDE 0.9% FLUSH
10.0000 mL | INTRAVENOUS | Status: DC | PRN
  Administered 2023-01-08: 10 mL

## 2023-01-08 MED ORDER — HEPARIN SOD (PORK) LOCK FLUSH 100 UNIT/ML IV SOLN
500.0000 [IU] | Freq: Once | INTRAVENOUS | Status: AC | PRN
  Administered 2023-01-08: 500 [IU]

## 2023-01-08 MED ORDER — SODIUM CHLORIDE 0.9 % IV SOLN: Freq: Once | INTRAVENOUS | Status: AC

## 2023-01-08 MED ORDER — SODIUM CHLORIDE 0.9 % IV SOLN
10.0000 mg | Freq: Once | INTRAVENOUS | Status: AC
  Administered 2023-01-08: 10 mg via INTRAVENOUS
  Filled 2023-01-08: qty 10

## 2023-01-08 NOTE — Patient Instructions (Signed)
[facility]  Discharge Instructions: Thank you for choosing [facility] to provide your oncology and hematology care.  If you have a lab appointment with the [facility], please come in thru the Main Entrance and check in at the main information desk.  Wear comfortable clothing and clothing appropriate for easy access to any Portacath or PICC line.   We strive to give you quality time with your provider. You may need to reschedule your appointment if you arrive late (15 or more minutes).  Arriving late affects you and other patients whose appointments are after yours.  Also, if you miss three or more appointments without notifying the office, you may be dismissed from the clinic at the provider's discretion.      For prescription refill requests, have your pharmacy contact our office and allow 72 hours for refills to be completed.    Today you received the following chemotherapy and/or immunotherapy agents, bendamustine   To help prevent nausea and vomiting after your treatment, we encourage you to take your nausea medication as directed.  BELOW ARE SYMPTOMS THAT SHOULD BE REPORTED IMMEDIATELY: *FEVER GREATER THAN 100.4 F (38 C) OR HIGHER *CHILLS OR SWEATING *NAUSEA AND VOMITING THAT IS NOT CONTROLLED WITH YOUR NAUSEA MEDICATION *UNUSUAL SHORTNESS OF BREATH *UNUSUAL BRUISING OR BLEEDING *URINARY PROBLEMS (pain or burning when urinating, or frequent urination) *BOWEL PROBLEMS (unusual diarrhea, constipation, pain near the anus) TENDERNESS IN MOUTH AND THROAT WITH OR WITHOUT PRESENCE OF ULCERS (sore throat, sores in mouth, or a toothache) UNUSUAL RASH, SWELLING OR PAIN  UNUSUAL VAGINAL DISCHARGE OR ITCHING   Items with * indicate a potential emergency and should be followed up as soon as possible or go to the Emergency Department if any problems should occur.  Please show the CHEMOTHERAPY ALERT CARD or IMMUNOTHERAPY ALERT CARD at check-in to the  Emergency Department and triage nurse.  Should you have questions after your visit or need to cancel or reschedule your appointment, please contact [facility] [phone]  and follow the prompts.  Office hours are 8:00 a.m. to 4:30 p.m. Monday - Friday. Please note that voicemails left after 4:00 p.m. may not be returned until the following business day.  We are closed weekends and major holidays. You have access to a nurse at all times for urgent questions. Please call the main number to the clinic [phone] and follow the prompts.  For any non-urgent questions, you may also contact your provider using MyChart. We now offer e-Visits for anyone 70 and older to request care online for non-urgent symptoms. For details visit [email or web address].   Also download the MyChart app! Go to the app store, search "MyChart", open the app, select [facility], and log in with your MyChart username and password.

## 2023-01-08 NOTE — Progress Notes (Signed)
Treatment given per orders. Patient tolerated it well without problems. Vitals stable and discharged home from clinic via wheelchair Follow up as scheduled.  

## 2023-01-08 NOTE — Progress Notes (Signed)
Patient here for C1D2, no complaints since yesterday. Proceed as planned per parameters.    Treatment given per orders. Patient tolerated it well without problems. Vitals stable and discharged home from clinic ambulatory. Follow up as scheduled.

## 2023-01-09 ENCOUNTER — Inpatient Hospital Stay: Payer: Medicare Other

## 2023-01-09 VITALS — BP 141/82 | HR 56 | Temp 97.3°F | Resp 18

## 2023-01-09 DIAGNOSIS — Z5111 Encounter for antineoplastic chemotherapy: Secondary | ICD-10-CM | POA: Diagnosis not present

## 2023-01-09 DIAGNOSIS — C8238 Follicular lymphoma grade IIIa, lymph nodes of multiple sites: Secondary | ICD-10-CM

## 2023-01-09 MED ORDER — PEGFILGRASTIM-CBQV 6 MG/0.6ML ~~LOC~~ SOSY
6.0000 mg | PREFILLED_SYRINGE | Freq: Once | SUBCUTANEOUS | Status: AC
  Administered 2023-01-09: 6 mg via SUBCUTANEOUS
  Filled 2023-01-09: qty 0.6

## 2023-01-09 NOTE — Progress Notes (Signed)
Alejandro [name] presents today for injection per the provider's orders.  Udenyca injection administration without incident; injection site WNL; see MAR for injection details.  Patient tolerated procedure well and without incident.  No complaints at this time. Discharged from clinic ambulatory in stable condition. Alert and oriented x 3. F/U with [facility] as scheduled.    24 hour follow up: Patient denies any side effects related to his first treatment other than fatigue. Patient denies nausea, vomiting,diarrhea. Patient given [employer] paper work pertaining to the [employer] injection and teaching performed. Understanding verbalized.

## 2023-01-09 NOTE — Patient Instructions (Signed)
Alejandro [name]  Discharge Instructions: Thank you for choosing [facility] to provide your oncology and hematology care.  If you have a lab appointment with the [facility], please come in thru the Main Entrance and check in at the main information desk.  Wear comfortable clothing and clothing appropriate for easy access to any Portacath or PICC line.   We strive to give you quality time with your provider. You may need to reschedule your appointment if you arrive late (15 or more minutes).  Arriving late affects you and other patients whose appointments are after yours.  Also, if you miss three or more appointments without notifying the office, you may be dismissed from the clinic at the provider's discretion.      For prescription refill requests, have your pharmacy contact our office and allow 72 hours for refills to be completed.    Today you received the following chemotherapy and/or immunotherapy agents Udenyca injection. Pegfilgrastim Injection What is this medication? PEGFILGRASTIM (PEG fil gra stim) lowers the risk of infection in people who are receiving chemotherapy. It works by Building control surveyor make more white blood cells, which protects your body from infection. It may also be used to help people who have been exposed to high doses of radiation. This medicine may be used for other purposes; ask your health care provider or pharmacist if you have questions. COMMON BRAND NAME(S): [employer], Neulasta, Nyvepria, Stimufend, UDENYCA, Ziextenzo What should I tell my care team before I take this medication? They need to know if you have any of these conditions: Kidney disease Latex allergy Ongoing radiation therapy Sickle cell disease Skin reactions to acrylic adhesives (On-Body Injector only) An unusual or allergic reaction to pegfilgrastim, filgrastim, other medications, foods, dyes, or preservatives Pregnant or trying to get  pregnant Breast-feeding How should I use this medication? This medication is for injection under the skin. If you get this medication at home, you will be taught how to prepare and give the pre-filled syringe or how to use the On-body Injector. Refer to the patient Instructions for Use for detailed instructions. Use exactly as directed. Tell your care team immediately if you suspect that the On-body Injector may not have performed as intended or if you suspect the use of the On-body Injector resulted in a missed or partial dose. It is important that you put your used needles and syringes in a special sharps container. Do not put them in a trash can. If you do not have a sharps container, call your pharmacist or care team to get one. Talk to your care team about the use of this medication in children. While this medication may be prescribed for selected conditions, precautions do apply. Overdosage: If you think you have taken too much of this medicine contact a poison control center or emergency room at once. NOTE: This medicine is only for you. Do not share this medicine with others. What if I miss a dose? It is important not to miss your dose. Call your care team if you miss your dose. If you miss a dose due to an On-body Injector failure or leakage, a new dose should be administered as soon as possible using a single prefilled syringe for manual use. What may interact with this medication? Interactions have not been studied. This list may not describe all possible interactions. Give your health care provider a list of all the medicines, herbs, non-prescription drugs, or dietary supplements you use. Also tell  them if you smoke, drink alcohol, or use illegal drugs. Some items may interact with your medicine. What should I watch for while using this medication? Your condition will be monitored carefully while you are receiving this medication. You may need blood work done while you are taking this  medication. Talk to your care team about your risk of cancer. You may be more at risk for certain types of cancer if you take this medication. If you are going to need a MRI, CT scan, or other procedure, tell your care team that you are using this medication (On-Body Injector only). What side effects may I notice from receiving this medication? Side effects that you should report to your care team as soon as possible: Allergic reactions--skin rash, itching, hives, swelling of the face, lips, tongue, or throat Capillary leak syndrome--stomach or muscle pain, unusual weakness or fatigue, feeling faint or lightheaded, decrease in the amount of urine, swelling of the ankles, hands, or feet, trouble breathing High white blood cell level--fever, fatigue, trouble breathing, night sweats, change in vision, weight loss Inflammation of the aorta--fever, fatigue, back, chest, or stomach pain, severe headache Kidney injury (glomerulonephritis)--decrease in the amount of urine, red or dark brown urine, foamy or bubbly urine, swelling of the ankles, hands, or feet Shortness of breath or trouble breathing Spleen injury--pain in upper left stomach or shoulder Unusual bruising or bleeding Side effects that usually do not require medical attention (report to your care team if they continue or are bothersome): Bone pain Pain in the hands or feet This list may not describe all possible side effects. Call your doctor for medical advice about side effects. You may report side effects to FDA at 1-800-FDA-1088. Where should I keep my medication? Keep out of the reach of children. If you are using this medication at home, you will be instructed on how to store it. Throw away any unused medication after the expiration date on the label. NOTE: This sheet is a summary. It may not cover all possible information. If you have questions about this medicine, talk to your doctor, pharmacist, or health care provider.  2023  Elsevier/Gold Standard (2021-06-06 00:00:00)       To help prevent nausea and vomiting after your treatment, we encourage you to take your nausea medication as directed.  BELOW ARE SYMPTOMS THAT SHOULD BE REPORTED IMMEDIATELY: *FEVER GREATER THAN 100.4 F (38 C) OR HIGHER *CHILLS OR SWEATING *NAUSEA AND VOMITING THAT IS NOT CONTROLLED WITH YOUR NAUSEA MEDICATION *UNUSUAL SHORTNESS OF BREATH *UNUSUAL BRUISING OR BLEEDING *URINARY PROBLEMS (pain or burning when urinating, or frequent urination) *BOWEL PROBLEMS (unusual diarrhea, constipation, pain near the anus) TENDERNESS IN MOUTH AND THROAT WITH OR WITHOUT PRESENCE OF ULCERS (sore throat, sores in mouth, or a toothache) UNUSUAL RASH, SWELLING OR PAIN  UNUSUAL VAGINAL DISCHARGE OR ITCHING   Items with * indicate a potential emergency and should be followed up as soon as possible or go to the Emergency Department if any problems should occur.  Please show the CHEMOTHERAPY ALERT CARD or IMMUNOTHERAPY ALERT CARD at check-in to the Emergency Department and triage nurse.  Should you have questions after your visit or need to cancel or reschedule your appointment, please contact [facility] [phone]  and follow the prompts.  Office hours are 8:00 a.m. to 4:30 p.m. Monday - Friday. Please note that voicemails left after 4:00 p.m. may not be returned until the following business day.  We are closed weekends and major holidays.  You have access to a nurse at all times for urgent questions. Please call the main number to the clinic [phone] and follow the prompts.  For any non-urgent questions, you may also contact your provider using MyChart. We now offer e-Visits for anyone 92 and older to request care online for non-urgent symptoms. For details visit [email or web address].   Also download the MyChart app! Go to the app store, search "MyChart", open the app, select [facility], and log in with your MyChart username and  password.

## 2023-01-09 NOTE — Anesthesia Postprocedure Evaluation (Signed)
Anesthesia Post Note  Patient: Alejandro [name]  Procedure(s) Performed: INSERTION PORT-A-CATH (Right: Chest)  Patient location during evaluation: Phase II Anesthesia Type: General Level of consciousness: awake Pain management: pain level controlled Vital Signs Assessment: post-procedure vital signs reviewed and stable Respiratory status: spontaneous breathing and respiratory function stable Cardiovascular status: blood pressure returned to baseline and stable Postop Assessment: no headache and no apparent nausea or vomiting Anesthetic complications: no Comments: Late entry   No notable events documented.   Last Vitals:  Vitals:   01/06/23 0649 01/06/23 0829  BP: (!) 146/74 111/69  Pulse: 72 73  Resp: 15 16  Temp: 36.8 C 36.9 C  SpO2: 97% 98%    Last Pain:  Vitals:   01/06/23 0829  TempSrc: Oral  PainSc: 0-No pain                 [name]

## 2023-01-13 ENCOUNTER — Ambulatory Visit: Payer: Medicare Other | Attending: Internal Medicine | Admitting: Internal Medicine

## 2023-01-13 ENCOUNTER — Encounter: Payer: Self-pay | Admitting: Internal Medicine

## 2023-01-13 VITALS — BP 156/88 | HR 81 | Ht 71.0 in | Wt 236.1 lb

## 2023-01-13 DIAGNOSIS — I4819 Other persistent atrial fibrillation: Secondary | ICD-10-CM

## 2023-01-13 NOTE — Patient Instructions (Signed)
Medication Instructions:   Stop Taking Hydrochlorothiazide ( HCTZ )   *If you need a refill on your cardiac medications before your next appointment, please call your pharmacy*   Lab Work: NONE  If you have labs (blood work) drawn today and your tests are completely normal, you will receive your results only by: [facility] (if you have MyChart) OR A paper copy in the mail If you have any lab test that is abnormal or we need to change your treatment, we will call you to review the results.   Testing/Procedures: NONE    Follow-Up: At [facility], you and your health needs are our priority.  As part of our continuing mission to provide you with exceptional heart care, we have created designated Provider Care Teams.  These Care Teams include your primary Cardiologist (physician) and Advanced Practice Providers (APPs -  Physician Assistants and Nurse Practitioners) who all work together to provide you with the care you need, when you need it.  We recommend signing up for the patient portal called "MyChart".  Sign up information is provided on this After Visit Summary.  MyChart is used to connect with patients for Virtual Visits (Telemedicine).  Patients are able to view lab/test results, encounter notes, upcoming appointments, etc.  Non-urgent messages can be sent to your provider as well.   To learn more about what you can do with MyChart, go to [email or web address].    Your next appointment:   1 year(s)  Provider:   Cristopher Peru, MD    Other Instructions Thank you for choosing [facility]!

## 2023-01-13 NOTE — Progress Notes (Signed)
HPI Alejandro [name] is referred today by Nena Polio PA, for evaluation of atrial fib and bradycardia. He has sinus node dysfunction and a h/o bradycardia. He has atrial fib and is asymptomatic. He has followed with Dr. [name] in the past. He has not had syncope. His main complaint is related to his sinus congestion. No palpitations or chest pain or sob.   No Known Allergies   Current Outpatient Medications  Medication Sig Dispense Refill   acetaminophen (TYLENOL) 500 MG tablet Take 1,000 mg by mouth every 6 (six) hours as needed for moderate pain or mild pain.     albuterol (VENTOLIN HFA) 108 (90 Base) MCG/ACT inhaler Inhale 2 puffs into the lungs every 6 (six) hours as needed for wheezing or shortness of breath.     allopurinol (ZYLOPRIM) 300 MG tablet Take 1 tablet (300 mg total) by mouth daily. 30 tablet 3   apixaban (ELIQUIS) 5 MG TABS tablet Take 1 tablet (5 mg total) by mouth 2 (two) times daily. 60 tablet 5   atorvastatin (LIPITOR) 40 MG tablet Take 40 mg by mouth at bedtime.     BENDAMUSTINE HCL IV Inject into the vein every 28 (twenty-eight) days.     cyanocobalamin (VITAMIN B12) 1000 MCG tablet Take 1,000 mcg by mouth daily.     cyanocobalamin (VITAMIN B12) 1000 MCG/ML injection Inject 1,000 mcg into the skin every 30 (thirty) days.     docusate sodium (COLACE) 100 MG capsule Take 1 capsule (100 mg total) by mouth 2 (two) times daily. (Patient taking differently: Take 100 mg by mouth 2 (two) times daily as needed for mild constipation or moderate constipation.) 60 capsule 2   dofetilide (TIKOSYN) 500 MCG capsule Take 500 mcg by mouth 2 (two) times daily.     famotidine (PEPCID) 20 MG tablet Take 1 tablet (20 mg total) by mouth 2 (two) times daily.     fluticasone (FLONASE) 50 MCG/ACT nasal spray Place 1 spray into both nostrils daily.     glipiZIDE (GLUCOTROL XL) 10 MG 24 hr tablet Take 10 mg by mouth daily.     hydrochlorothiazide (HYDRODIURIL) 25 MG tablet Take 25 mg by mouth  daily.     lidocaine-prilocaine (EMLA) cream Apply to affected area once 30 g 3   loratadine (CLARITIN) 10 MG tablet Take 10 mg by mouth daily.     Melatonin 10 MG CAPS Take 30 mg by mouth at bedtime as needed (sleep).     metFORMIN (GLUCOPHAGE-XR) 750 MG 24 hr tablet Take 750 mg by mouth 2 (two) times daily with a meal.     metFORMIN (GLUCOPHAGE-XR) 750 MG 24 hr tablet Take 1 tablet by mouth 2 (two) times daily with a meal.     oxyCODONE (ROXICODONE) 5 MG immediate release tablet Take 1 tablet (5 mg total) by mouth every 6 (six) hours as needed. (Patient taking differently: Take 5 mg by mouth every 6 (six) hours as needed for severe pain or moderate pain.) 12 tablet 0   pantoprazole (PROTONIX) 40 MG tablet Take 40 mg by mouth daily.     polyethylene glycol (MIRALAX / GLYCOLAX) 17 g packet Take 17 g by mouth daily. (Patient taking differently: Take 17 g by mouth daily as needed for moderate constipation.) 30 each 0   potassium chloride (MICRO-K) 10 MEQ CR capsule Take 10 mEq by mouth daily.     potassium chloride (MICRO-K) 10 MEQ CR capsule Take 1 capsule by mouth daily.  Povidone, PF, (IVIZIA DRY EYES) 0.5 % SOLN Place 1 drop into both eyes daily as needed (dry eyes).     prochlorperazine (COMPAZINE) 10 MG tablet Take 1 tablet (10 mg total) by mouth every 6 (six) hours as needed for nausea or vomiting. 60 tablet 1   riTUXimab (RITUXAN IV) Inject into the vein every 28 (twenty-eight) days.     spironolactone (ALDACTONE) 25 MG tablet Take 0.5 tablets (12.5 mg total) by mouth daily. 45 tablet 3   valsartan (DIOVAN) 40 MG tablet Take 1 tablet (40 mg total) by mouth daily. 30 tablet 11   No current facility-administered medications for this visit.   Facility-Administered Medications Ordered in Other Visits  Medication Dose Route Frequency Provider Last Rate Last Admin   sodium chloride flush (NS) 0.9 % injection 10 mL  10 mL Intracatheter PRN Derek Jack, MD   10 mL at 01/07/23 1446      Past Medical History:  Diagnosis Date   A-fib [facility])    AAA Ultrasound 01/11/2020    AAA [place] 01/11/20:  no abdominal aortic aneurysm, 2.4 cm   Aortic insufficiency    a. mild-mod by intraop TEE 05/2018 (no aortic stenosis) // Echo 2/21: mild //  Echo 9/22: trivial   Aortic stenosis    Echo 9/22: mild (mean 16) // Echo 2/21: mild (mean 18.7)   CAD (coronary artery disease)    a. NSTEMI 05/2018 with multivessel disease -> s/p CABGx4 05/11/18 // Myoview 06/07/20: inf scar w mild peri-infarct ischemia, EF 37; Int Risk   Carotid artery disease ([facility])    PreCABG [place] 05/07/18: Bilat ICA 1-39   CKD (chronic kidney disease), stage III ([facility])    HFimpEF (heart failure with improved EF)    Echocardiogram 01/11/20: EF 45, Gr 3 DD, mild AI, mild AS (mean 18.7 mmHg) // Echocardiogram 08/14/21:  EF 55-60, inf HK, mild LVH, Gr 1 DD, normal RVSF, RVSP 25.3, mild LAE, trivial MR, MAC, trivial AI, mild AS (mean 16 mmHg, Vmax 283 cm/s, DI 0.41)   History of nephrolithiasis 2004   Hyperlipidemia    Hypertension    IBS (irritable colon syndrome)    Ischemic cardiomyopathy    Mitral regurgitation    Echo 9/22: trivial MR   Postoperative atrial fibrillation (HCC)    PVC's (premature ventricular contractions)    Reflux    Type 2 diabetes mellitus ([place])     ROS:   All systems reviewed and negative except as noted in the HPI.   Past Surgical History:  Procedure Laterality Date   BRONCHIAL NEEDLE ASPIRATION BIOPSY  11/17/2022   Procedure: BRONCHIAL NEEDLE ASPIRATION BIOPSIES;  Surgeon: Collene Gobble, MD;  Location: [facility];  Service: Cardiopulmonary;;   CARDIOVERSION     CORONARY ARTERY BYPASS GRAFT N/A 05/11/2018   Procedure: CORONARY ARTERY BYPASS GRAFTING (CABG), on pump, times four, using left internal mammary artery and endoscopically harvested right greater saphenous leg vein.;  Surgeon: Ivin Poot, MD;  Location: [facility];  Service: Open Heart Surgery;  Laterality: N/A;    ESOPHAGOGASTRODUODENOSCOPY  02/04/2012   Procedure: ESOPHAGOGASTRODUODENOSCOPY (EGD);  Surgeon: Rogene Houston, MD;  [facility];  Service: Endoscopy;  Laterality: N/A;  100   INGUINAL LYMPH NODE BIOPSY Left 12/15/2022   Procedure: INGUINAL LYMPH NODE BIOPSY;  Surgeon: Rusty Aus, DO;  Location: [facility];  Service: General;  Laterality: Left;   LEFT HEART CATH AND CORONARY ANGIOGRAPHY N/A 05/04/2018   Procedure: LEFT HEART CATH AND CORONARY  ANGIOGRAPHY;  Surgeon: Belva Crome, MD;  Location: [facility];  Service: Cardiovascular;  Laterality: N/A;   NASAL FRACTURE SURGERY     PORTACATH PLACEMENT Right 01/06/2023   Procedure: INSERTION PORT-A-CATH;  Surgeon: Rusty Aus, DO;  Location: [facility];  Service: General;  Laterality: Right;   TEE WITHOUT CARDIOVERSION N/A 05/11/2018   Procedure: TRANSESOPHAGEAL ECHOCARDIOGRAM (TEE);  Surgeon: Prescott Gum, Collier Salina, MD;  Location: [facility];  Service: Open Heart Surgery;  Laterality: N/A;   VIDEO BRONCHOSCOPY WITH ENDOBRONCHIAL ULTRASOUND Right 11/17/2022   Procedure: VIDEO BRONCHOSCOPY WITH ENDOBRONCHIAL ULTRASOUND;  Surgeon: Collene Gobble, MD;  Location: [facility];  Service: Cardiopulmonary;  Laterality: Right;     Family History  Problem Relation Age of Onset   Diabetes Mellitus II Mother    Heart attack Father    Heart attack Brother    Heart disease Other    Arthritis Other    Asthma Other    Diabetes Other      Social History   Socioeconomic History   Marital status: Widowed    Spouse name: Not on file   Number of children: Not on file   Years of education: Not on file   Highest education level: Not on file  Occupational History   Not on file  Tobacco Use   Smoking status: Never   Smokeless tobacco: Never  Vaping Use   Vaping Use: Never used  Substance and Sexual Activity   Alcohol use: No    Alcohol/week: 0.0 standard drinks of alcohol   Drug use: No   Sexual activity: Yes  Other  Topics Concern   Not on file  Social History Narrative   Not on file   Social Determinants of Health   Financial Resource Strain: Not on file  Food Insecurity: Not on file  Transportation Needs: Not on file  Physical Activity: Not on file  Stress: Not on file  Social Connections: Not on file  Intimate Partner Violence: Not on file     BP (!) 156/88   Pulse 81   Ht 5\' 11"  (1.803 m)   Wt 236 lb 1.6 oz (107.1 kg)   SpO2 98%   BMI 32.93 kg/m   Physical Exam:  Well appearing NAD HEENT: Unremarkable Neck:  No JVD, no thyromegally Lymphatics:  No adenopathy Back:  No CVA tenderness Lungs:  Clear HEART:  Regular rate rhythm, no murmurs, no rubs, no clicks Abd:  soft, positive bowel sounds, no organomegally, no rebound, no guarding Ext:  2 plus pulses, no edema, no cyanosis, no clubbing Skin:  No rashes no nodules Neuro:  CN II through XII intact, motor grossly intact  EKG  DEVICE  Normal device function.  See PaceArt for details.   Assess/Plan:  Atrial fib - off of all AV nodal blocking drugs his VR appears to be well controlled. I do not think he needs a PPM. His meds show that he is on dofetilide. Continue Sinus node dysfunction - review of his old ECG's shows marked sinus node dysfunction but it appears that he is now mostly in atrial fib. He will call us if his hear rate slows.  CAD - he denies anginal symptoms. We will follow. Diastolic heart failure - he is class 2. He will continue his current meds except we will ask his to stop the HCTZ as it is incompatible with dofetilide.   Carleene Overlie Kylin Genna,MD

## 2023-01-14 ENCOUNTER — Other Ambulatory Visit: Payer: Self-pay

## 2023-01-14 DIAGNOSIS — C8238 Follicular lymphoma grade IIIa, lymph nodes of multiple sites: Secondary | ICD-10-CM

## 2023-01-17 NOTE — Progress Notes (Unsigned)
[name] [place] [address] Phone: [phone] Fax: [facility] PROGRESS NOTE   Alejandro [name] [id number] Oct 24, 1941 82 y.o.  Alejandro [name] is managed by Dr. Delton Coombes for grade 3A follicular lymphoma, stage III  Actively treated with chemotherapy/immunotherapy/hormonal therapy: YES  Current therapy: Bendamustine + rituximab  Last treated: D1/C1 01/07/2023  INTERVAL HISTORY:  Chief Complaint: Symptom management and chemotherapy follow-up  [name] is managed by Dr. Delton Coombes for his stage III high-grade follicular lymphoma.  He started his first cycle of Bendamustine and rituximab on 01/07/2023 and also received Udenyca on 01/09/2023.  Overall, he feels *** *** Nausea/vomiting/diarrhea *** Mouth sores *** Fatigue *** Appetite *** Oral intake (food) *** Liquids/water He  has *** energy and *** appetite.  His  weight has been ***.  LAST VISIT *** Severe fatigue (unable to function well) prior to starting treatment *** Chronic cough and SOB *** Congested in upper chest and nasal region *** No fevers, night sweats, unintentional weight loss.  OTHER ROS *** Changes in urine *** Rash or skin changes *** Swelling/fluid buildup *** Chest pain, SOB, DOE *** Neuropathy *** New pain (joint pain, headaches, leg pain) *** Bruising or bleeding *** B symptoms *** Mental status changes or behaviors/mood changes  BENDAMUSTINE: *** Cytopenias, nausea/vomiting, diarrhea, fatigue  RITUXIMAB: *** Severe skin and mouth reactions, hepatitis B reactivation, infusion/allergic reactions, progressive multifocal leukoencephalopathy, cytopenias, fatigue, nausea/vomiting, flulike symptoms  ASSESSMENT & PLAN:  # Stage III high-grade (grade 3A) follicular lymphoma - Primary medical oncologist is Dr. Delton Coombes - Affected areas per PET/CT scan (09/11/2022) includes cervical lymph nodes, parotid gland, right vocal  cord, right peritracheal mass, left para-aortic adenopathy, retroperitoneal and mesenteric lymph nodes, left inguinal adenopathy and hypermetabolic activity along the medial aspect of left femoral neck - Day 1/cycle 1 of Bendamustine and rituximab on 01/07/2023, with Udenyca given on 01/09/2023. - PLAN: Neck scheduled appointment with oncologist ***  # TLS prophylaxis - Patient is taking allopurinol 300 mg daily - Labs today *** - PLAN: ***  # *** - *** - PLAN: ***   # Left vocal cord paralysis - S/p laryngoscopy by Dr. Constance Holster on 10/30/2022 - Reports hoarse voice with occasional choking on liquids *** - Dr. Delton Coombes planning on upcoming CT neck with contrast   PLAN SUMMARY: >> *** >> *** >> *** >> Next scheduled appointment with main oncologist: ***   REVIEW OF SYSTEMS: ***  Review of Systems  Past Medical History, Surgical history, Social history, and Family history were reviewed as documented elsewhere in chart, and were updated as appropriate.   OBJECTIVE:  Physical Exam: *** There were no vitals taken for this visit. ECOG: ***  Physical Exam  Lab Review:     Component Value Date/Time   NA 130 (L) 01/05/2023 1504   K 3.9 01/05/2023 1504   CL 97 (L) 01/05/2023 1504   CO2 22 01/05/2023 1504   GLUCOSE 182 (H) 01/05/2023 1504   BUN 15 01/05/2023 1504   CREATININE 1.06 01/05/2023 1504   CALCIUM 8.8 (L) 01/05/2023 1504   PROT 7.6 01/05/2023 1504   ALBUMIN 4.0 01/05/2023 1504   AST 23 01/05/2023 1504   ALT 19 01/05/2023 1504   ALKPHOS 105 01/05/2023 1504   BILITOT 0.7 01/05/2023 1504   GFRNONAA >60 01/05/2023 1504   GFRAA >60 01/27/2020 1018       Component Value Date/Time   WBC 12.7 (H) 01/05/2023 1504   RBC  4.33 01/05/2023 1504   HGB 13.1 01/05/2023 1504   HCT 38.3 (L) 01/05/2023 1504   PLT 279 01/05/2023 1504   MCV 88.5 01/05/2023 1504   MCH 30.3 01/05/2023 1504   MCHC 34.2 01/05/2023 1504   RDW 13.5 01/05/2023 1504   LYMPHSABS 1.4 01/05/2023 1504    MONOABS 1.8 (H) 01/05/2023 1504   EOSABS 0.0 01/05/2023 1504   BASOSABS 0.0 01/05/2023 1504   -------------------------------  Imaging from last 24 hours (if applicable): Radiology interpretation: DG Chest Port 1 View  Result Date: 01/06/2023 CLINICAL DATA:  Port-A-Cath placement. EXAM: PORTABLE CHEST 1 VIEW COMPARISON:  CT chest 12/07/2022.  Chest x-ray 12/07/2022. FINDINGS: Port-A-Cath noted with tip over superior vena cava. Prior CABG. Cardiomegaly. No pulmonary venous congestion. Right perihilar mass best identified on prior recent CT of 12/07/2022. Low lung volumes with mild bibasilar atelectasis. No prominent pleural effusion or pneumothorax. IMPRESSION: 1. Port-A-Cath noted with tip over superior vena cava. No pneumothorax. 2. Prior CABG. Cardiomegaly.  No pulmonary venous congestion. 3. Right perihilar mass best identified on prior recent CT of 12/07/2022. Low lung volumes with mild bibasilar atelectasis. Electronically Signed   By: Marcello Moores  Register M.D.   On: 01/06/2023 08:49   DG C-Arm 1-60 Min-No Report  Result Date: 01/06/2023 Fluoroscopy was utilized by the requesting physician.  No radiographic interpretation.      WRAP UP:  All questions were answered. The patient knows to call the clinic with any problems, questions or concerns.  Medical decision making: ***  Time spent on visit: I spent {CHL ONC TIME VISIT - EVOJJ:[id number]} counseling the patient face to face. The total time spent in the appointment was {CHL ONC TIME VISIT - BZJIR:[id number]} and more than 50% was on counseling.  Harriett Rush, PA-C  ***

## 2023-01-19 ENCOUNTER — Inpatient Hospital Stay: Payer: Medicare Other | Admitting: Physician Assistant

## 2023-01-19 ENCOUNTER — Other Ambulatory Visit: Payer: Self-pay

## 2023-01-19 ENCOUNTER — Inpatient Hospital Stay: Payer: Medicare Other

## 2023-01-19 VITALS — BP 146/68 | HR 69 | Temp 97.0°F | Resp 18 | Wt 232.6 lb

## 2023-01-19 DIAGNOSIS — Z09 Encounter for follow-up examination after completed treatment for conditions other than malignant neoplasm: Secondary | ICD-10-CM

## 2023-01-19 DIAGNOSIS — R5383 Other fatigue: Secondary | ICD-10-CM | POA: Diagnosis not present

## 2023-01-19 DIAGNOSIS — G4733 Obstructive sleep apnea (adult) (pediatric): Secondary | ICD-10-CM | POA: Diagnosis not present

## 2023-01-19 DIAGNOSIS — Z95828 Presence of other vascular implants and grafts: Secondary | ICD-10-CM

## 2023-01-19 DIAGNOSIS — C8238 Follicular lymphoma grade IIIa, lymph nodes of multiple sites: Secondary | ICD-10-CM

## 2023-01-19 DIAGNOSIS — J0100 Acute maxillary sinusitis, unspecified: Secondary | ICD-10-CM

## 2023-01-19 DIAGNOSIS — Z5111 Encounter for antineoplastic chemotherapy: Secondary | ICD-10-CM | POA: Diagnosis not present

## 2023-01-19 LAB — CBC WITH DIFFERENTIAL/PLATELET
Abs Immature Granulocytes: 0.52 10*3/uL — ABNORMAL HIGH (ref 0.00–0.07)
Basophils Absolute: 0.1 10*3/uL (ref 0.0–0.1)
Basophils Relative: 0 %
Eosinophils Absolute: 0 10*3/uL (ref 0.0–0.5)
Eosinophils Relative: 0 %
HCT: 38.6 % — ABNORMAL LOW (ref 39.0–52.0)
Hemoglobin: 13 g/dL (ref 13.0–17.0)
Immature Granulocytes: 3 %
Lymphocytes Relative: 8 %
Lymphs Abs: 1.6 10*3/uL (ref 0.7–4.0)
MCH: 30.2 pg (ref 26.0–34.0)
MCHC: 33.7 g/dL (ref 30.0–36.0)
MCV: 89.8 fL (ref 80.0–100.0)
Monocytes Absolute: 1.2 10*3/uL — ABNORMAL HIGH (ref 0.1–1.0)
Monocytes Relative: 6 %
Neutro Abs: 17.4 10*3/uL — ABNORMAL HIGH (ref 1.7–7.7)
Neutrophils Relative %: 83 %
Platelets: 166 10*3/uL (ref 150–400)
RBC: 4.3 MIL/uL (ref 4.22–5.81)
RDW: 13.7 % (ref 11.5–15.5)
WBC: 20.9 10*3/uL — ABNORMAL HIGH (ref 4.0–10.5)
nRBC: 0 % (ref 0.0–0.2)

## 2023-01-19 LAB — COMPREHENSIVE METABOLIC PANEL
ALT: 30 U/L (ref 0–44)
AST: 26 U/L (ref 15–41)
Albumin: 3.7 g/dL (ref 3.5–5.0)
Alkaline Phosphatase: 130 U/L — ABNORMAL HIGH (ref 38–126)
Anion gap: 10 (ref 5–15)
BUN: 17 mg/dL (ref 8–23)
CO2: 25 mmol/L (ref 22–32)
Calcium: 8.6 mg/dL — ABNORMAL LOW (ref 8.9–10.3)
Chloride: 99 mmol/L (ref 98–111)
Creatinine, Ser: 1.27 mg/dL — ABNORMAL HIGH (ref 0.61–1.24)
GFR, Estimated: 57 mL/min — ABNORMAL LOW (ref >60)
Glucose, Bld: 224 mg/dL — ABNORMAL HIGH (ref 70–99)
Potassium: 4 mmol/L (ref 3.5–5.1)
Sodium: 134 mmol/L — ABNORMAL LOW (ref 135–145)
Total Bilirubin: 0.8 mg/dL (ref 0.3–1.2)
Total Protein: 7.1 g/dL (ref 6.5–8.1)

## 2023-01-19 LAB — MAGNESIUM: Magnesium: 1.7 mg/dL (ref 1.7–2.4)

## 2023-01-19 LAB — URIC ACID: Uric Acid, Serum: 6.4 mg/dL (ref 3.7–8.6)

## 2023-01-19 LAB — LACTATE DEHYDROGENASE: LDH: 146 U/L (ref 98–192)

## 2023-01-19 MED ORDER — SODIUM CHLORIDE 0.9% FLUSH
10.0000 mL | Freq: Once | INTRAVENOUS | Status: AC
  Administered 2023-01-19: 10 mL via INTRAVENOUS

## 2023-01-19 MED ORDER — SALINE SPRAY 0.65 % NA SOLN: 1.0000 | NASAL | 2 refills | Status: DC | PRN

## 2023-01-19 MED ORDER — HEPARIN SOD (PORK) LOCK FLUSH 100 UNIT/ML IV SOLN
500.0000 [IU] | Freq: Once | INTRAVENOUS | Status: AC
  Administered 2023-01-19: 500 [IU] via INTRAVENOUS

## 2023-01-19 NOTE — Patient Instructions (Signed)
Alejandro [name] at [facility] **   You were seen today by Tarri Abernethy PA-C for your chemotherapy follow-up visit.    SINUS ISSUES We will check a CT scan of your head to make sure you do not have any sinus problems related to lymphoma or infection. I have sent prescription to your pharmacy for saline nasal spray that you can use twice daily to help with your symptoms.  DEHYDRATION: It is very important that you remain hydrated while you are receiving treatment!   Make sure that you are drinking at least 64 ounces of water each day.  You can drink juice and other beverages in addition to water, but should still drink plenty of water.  If you are unable to drink enough water, or are losing fluids through vomiting or diarrhea, please [place]. If you are severely dehydrated, you may have symptoms such as low blood pressure, increased weakness, lightheadedness with standing, passing out, or feeling that your heartbeat is racing.  If the symptoms occur, please [place].  If the symptoms are severe or occur outside of business hours, proceed to the emergency department.  FATIGUE Your fatigue may be related to your lymphoma cancer and your chemotherapy cancer treatment. I also suspect that your daytime sleepiness is related to your sleep apnea.  I will send referral for you to see a sleep medicine doctor to discuss other options and different types of masks for your sleep apnea. We will check your iron levels and B12 levels  FOLLOW-UP APPOINTMENT: You are scheduled for visit with Dr. Delton Coombes on 02/04/2023.  If you have any new or worsening symptoms before that time, do not hesitate to call the cancer center at [phone] to schedule a same-day symptom management visit.  ** Thank you for trusting me with your healthcare!  I strive to provide all of my patients with quality care at each visit.  If you  receive a survey for this visit, I would be so grateful to you for taking the time to provide feedback.  Thank you in advance!  ~ Jonahtan Manseau                   Dr. Derek Jack   &   Tarri Abernethy, PA-C   - - - - - - - - - - - - - - - - - -    Thank you for choosing [place] at [facility] to provide your oncology and hematology care.  To afford each patient quality time with our provider, please arrive at least 15 minutes before your scheduled appointment time.   If you have a lab appointment with the [facility] please come in thru the Main Entrance and check in at the main information desk.  You need to re-schedule your appointment should you arrive 10 or more minutes late.  We strive to give you quality time with our providers, and arriving late affects you and other patients whose appointments are after yours.  Also, if you no show three or more times for appointments you may be dismissed from the clinic at the providers discretion.     Again, thank you for choosing [facility].  Our hope is that these requests will decrease the amount of time that you wait before being seen by our physicians.       _____________________________________________________________  Should you have questions after your  visit to [facility], please contact our office at [phone] and follow the prompts.  Our office hours are 8:00 a.m. and 4:30 p.m. Monday - Friday.  Please note that voicemails left after 4:00 p.m. may not be returned until the following business day.  We are closed weekends and major holidays.  You do have access to a nurse 24-7, just call the main number to the clinic [phone] and do not press any options, hold on the line and a nurse will answer the phone.    For prescription refill requests, have your pharmacy contact our office and allow 72 hours.

## 2023-01-19 NOTE — Progress Notes (Signed)
Port flushed with good blood return noted. No bruising or swelling at site. Bandaid applied and patient discharged in satisfactory condition. VVS stable with no signs or symptoms of distressed noted. 

## 2023-01-22 ENCOUNTER — Ambulatory Visit (INDEPENDENT_AMBULATORY_CARE_PROVIDER_SITE_OTHER): Payer: Medicare Other | Admitting: Surgery

## 2023-01-22 DIAGNOSIS — Z09 Encounter for follow-up examination after completed treatment for conditions other than malignant neoplasm: Secondary | ICD-10-CM

## 2023-01-22 NOTE — Progress Notes (Signed)
[facility]  Attempted to call patient to see how he was doing since Port-a-cath insertion on 2/6.  Called patient's home phone but unable to leave voicemail because the voicemail box had not been set up.  If he has any issues related to the port, please call my office at [phone].  Graciella Freer, DO [facility] [address] [place], [place] [address] [phone] (office)

## 2023-01-28 ENCOUNTER — Ambulatory Visit (HOSPITAL_COMMUNITY)
Admission: RE | Admit: 2023-01-28 | Discharge: 2023-01-28 | Disposition: A | Discharge status: Home / Self Care | Payer: Medicare Other | Source: Ambulatory Visit | Attending: Physician Assistant | Admitting: Physician Assistant

## 2023-01-28 DIAGNOSIS — J0100 Acute maxillary sinusitis, unspecified: Secondary | ICD-10-CM | POA: Diagnosis present

## 2023-01-28 MED ORDER — IOHEXOL 300 MG/ML  SOLN
75.0000 mL | Freq: Once | INTRAMUSCULAR | Status: AC | PRN
  Administered 2023-01-28: 75 mL via INTRAVENOUS

## 2023-01-29 ENCOUNTER — Encounter: Payer: Self-pay | Admitting: Radiology

## 2023-02-02 ENCOUNTER — Telehealth: Payer: Self-pay | Admitting: *Deleted

## 2023-02-02 ENCOUNTER — Other Ambulatory Visit: Payer: Self-pay | Admitting: Physician Assistant

## 2023-02-02 DIAGNOSIS — J310 Chronic rhinitis: Secondary | ICD-10-CM

## 2023-02-02 MED ORDER — FLUTICASONE PROPIONATE 50 MCG/ACT NA SUSP: 1.0000 | Freq: Every day | NASAL | 2 refills | Status: DC

## 2023-02-02 NOTE — Telephone Encounter (Signed)
Called patient to make him aware that Flonase was called in to his pharmacy.  Advised that he make [place] aware if he has no improvement in symptoms in 2-4 weeks and we will make a referral to ENT.  Verbalized understanding.

## 2023-02-02 NOTE — Progress Notes (Addendum)
Maxillofacial/sinus CT scan did not show any concerning findings such as tumors or signs of infection.   Patient reports no relief from OTC nasal saline spray.  Prescription sent to pharmacy for intranasal fluticasone (1 spray per nostril per day).  Nurse to notify patient.  If no relief after 2 to 4 weeks, consider ENT referral.

## 2023-02-03 NOTE — Progress Notes (Signed)
[facility] [address] [place] [address]    Clinic Day:  02/04/2023  Referring physician: Pablo Lawrence, NP  Patient Care Team: Pablo Lawrence, NP as PCP - General (Adult Health Nurse Practitioner) Satira Sark, MD as PCP - Cardiology (Cardiology) Derek Jack, MD as Medical Oncologist (Medical Oncology) Brien Mates, RN as Oncology Nurse Navigator (Medical Oncology)   ASSESSMENT & PLAN:   Assessment: 1.  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  No B symptoms. - Reports hoarseness of voice for the last 1 month with occasional choking to liquids. - CT chest (09/03/2022): Interval enlargement of matted appearing pretracheal and right paratracheal lymph node measuring 2.7 x 2.7 cm, previously 1.9 x 1.6 cm (08/23/2021), mild pulmonary fibrosis. - PET/CT scan (09/11/2022): Mildly metabolically lower cervical lymph nodes bilaterally, numerous on the right with SUV 3.7.  Nodule in the deep lobe of the right parotid gland, 1.4 x 1.0 SUV 10.2.  Asymmetric hypermetabolic activity in the right vocal cord, SUV 8.7 with no focal mass.  Right paratracheal mass 3.7 x 2.8 cm SUV 10.6.  10 mm left paratracheal node with SUV 5.5.  Left para-aortic adenopathy 4.5 x 2.4 cm, SUV 12.4.  Additional smaller retroperitoneal and mesenteric lymph nodes mildly hypermetabolic.  10 mm left inguinal node with SUV 4.3.  Hypermetabolic activity along the medial aspect of the left femoral neck SUV 6.8.  No clear bone lesion on the CT images. - Retroperitoneal lymph node biopsy (10/10/2022): Atypical lymphoid proliferation, but concerning for lymphoproliferative disorder. - ENT evaluation (Dr. Constance Holster) fiberoptic laryngoscopy: Left vocal cord paralysis - Bronchoscopy and biopsy of 4R lymph node (11/17/2022): Suspicious for B-cell lymphoma. - Left inguinal excisional biopsy on 12/15/2022 - Pathology: High-grade  follicular lymphoma, grade 3 - Cycle 1 of BR on 01/07/2023   2.  Social/family history: - He lives at home by himself and is independent of all ADLs and IADLs. 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  Non-smoker.  Has secondhand smoke exposure from his wife. - No family history of malignancies.  Plan: 1.  Stage III high-grade follicular lymphoma (grade 3A): - He has tolerated cycle 1 of Bendamustine and rituximab reasonably well at low-dose of 50 mg/m. - He felt tired for couple of days.  Did not have any major GI side effects. - Reviewed labs today which showed normal LFTs and creatinine.  CBC was grossly normal.  Ferritin was 47 with percent saturation of 13.  B12 was normal. - He will proceed with cycle 2 of BR today.  Will increase Bendamustine to 60 mg/m. - RTC 4 weeks for follow-up for cycle 3.   2.  TLS prophylaxis: - Continue allopurinol 300 mg daily.  Uric acid is 4.5 today.   3.  Left vocal cord paralysis: - Dr. Constance Holster did laryngoscopy on 10/30/2022. - He recommends CT scan of the neck with contrast.  We will order at next visit.  Orders Placed This Encounter  Procedures   NM PET Image Restag (PS) Skull Base To Thigh    Standing Status:   Future    Standing Expiration Date:   02/04/2024    Order Specific Question:   If indicated for the ordered procedure, I authorize the administration of a radiopharmaceutical per Radiology protocol    Answer:   Yes    Order Specific Question:   Preferred imaging location?  Answer:   [facility]      I,Alexis Herring,acting as a Education administrator for [employer], MD.,have documented all relevant documentation on the behalf of Derek Jack, MD,as directed by  Derek Jack, MD while in the presence of Derek Jack, MD.   I, Derek Jack MD, have reviewed the above documentation for accuracy and completeness, and I agree with the above.   Derek Jack, MD   3/6/20245:58 PM  CHIEF  COMPLAINT:   Diagnosis: 3A follicular lymphoma, stage III    Cancer Staging  Follicular lymphoma grade 3a ([facility]) Staging form: Hodgkin and Non-Hodgkin Lymphoma, AJCC 8th Edition - Clinical stage from [id number]: Stage III (Follicular lymphoma) - Signed by Derek Jack, MD on 12/23/2022    Prior Therapy: none  Current Therapy:  Bendamustine and rituximab    HISTORY OF PRESENT ILLNESS:   Oncology History  Follicular lymphoma grade 3a ([place])  12/23/2022 Initial Diagnosis   Follicular lymphoma grade 3a ([place])   12/23/2022 Cancer Staging   Staging form: Hodgkin and Non-Hodgkin Lymphoma, AJCC 8th Edition - Clinical stage from [id number]: Stage III (Follicular lymphoma) - Signed by Derek Jack, MD on 12/23/2022 Histopathologic type: Follicular lymphoma, grade 3 Stage prefix: Initial diagnosis   01/07/2023 -  Chemotherapy   Patient is on Treatment Plan : NON-HODGKINS LYMPHOMA Rituximab D1 + Bendamustine D1,2 q28d x 6 cycles        INTERVAL HISTORY:   Alejandro [name] is a 82 y.o. male presenting to clinic today for follow up of 3A follicular lymphoma, stage III. He was last seen by me on 12/23/22.   He was last seen in follow up in our office by [name] on 01/19/23. At that time, he noted worsened fatigue after chemotherapy with a few days of poor appetite and ongoing sinus symptoms x4 months.  Today, he states that he is doing well overall. His appetite level is at 100%. His energy level is at 75%. He denies any GI side effects.  He felt tired for couple of days after last cycle.   PAST MEDICAL HISTORY:   Past Medical History: Past Medical History:  Diagnosis Date   A-fib ([facility])    AAA Ultrasound 01/11/2020    AAA [place] 01/11/20:  no abdominal aortic aneurysm, 2.4 cm   Aortic insufficiency    a. mild-mod by intraop TEE 05/2018 (no aortic stenosis) // Echo 2/21: mild //  Echo 9/22: trivial   Aortic stenosis    Echo 9/22: mild (mean 16) // Echo 2/21: mild (mean 18.7)    CAD (coronary artery disease)    a. NSTEMI 05/2018 with multivessel disease -> s/p CABGx4 05/11/18 // Myoview 06/07/20: inf scar w mild peri-infarct ischemia, EF 37; Int Risk   Carotid artery disease ([place])    PreCABG [place] 05/07/18: Bilat ICA 1-39   CKD (chronic kidney disease), stage III ([facility])    HFimpEF (heart failure with improved EF)    Echocardiogram 01/11/20: EF 45, Gr 3 DD, mild AI, mild AS (mean 18.7 mmHg) // Echocardiogram 08/14/21:  EF 55-60, inf HK, mild LVH, Gr 1 DD, normal RVSF, RVSP 25.3, mild LAE, trivial MR, MAC, trivial AI, mild AS (mean 16 mmHg, Vmax 283 cm/s, DI 0.41)   History of nephrolithiasis 2004   Hyperlipidemia    Hypertension    IBS (irritable colon syndrome)    Ischemic cardiomyopathy    Mitral regurgitation    Echo 9/22: trivial MR   Postoperative atrial fibrillation ([facility])    PVC's (premature ventricular contractions)  Reflux    Type 2 diabetes mellitus ([facility])     Surgical History: Past Surgical History:  Procedure Laterality Date   BRONCHIAL NEEDLE ASPIRATION BIOPSY  11/17/2022   Procedure: BRONCHIAL NEEDLE ASPIRATION BIOPSIES;  Surgeon: Collene Gobble, MD;  Location: [facility];  Service: Cardiopulmonary;;   CARDIOVERSION     CORONARY ARTERY BYPASS GRAFT N/A 05/11/2018   Procedure: CORONARY ARTERY BYPASS GRAFTING (CABG), on pump, times four, using left internal mammary artery and endoscopically harvested right greater saphenous leg vein.;  Surgeon: Ivin Poot, MD;  Location: [facility];  Service: Open Heart Surgery;  Laterality: N/A;   ESOPHAGOGASTRODUODENOSCOPY  02/04/2012   Procedure: ESOPHAGOGASTRODUODENOSCOPY (EGD);  Surgeon: Rogene Houston, MD;  [facility];  Service: Endoscopy;  Laterality: N/A;  100   INGUINAL LYMPH NODE BIOPSY Left 12/15/2022   Procedure: INGUINAL LYMPH NODE BIOPSY;  Surgeon: Rusty Aus, DO;  Location: [facility];  Service: General;  Laterality: Left;   LEFT HEART CATH AND CORONARY ANGIOGRAPHY N/A 05/04/2018    Procedure: LEFT HEART CATH AND CORONARY ANGIOGRAPHY;  Surgeon: Belva Crome, MD;  Location: [facility];  Service: Cardiovascular;  Laterality: N/A;   NASAL FRACTURE SURGERY     PORTACATH PLACEMENT Right 01/06/2023   Procedure: INSERTION PORT-A-CATH;  Surgeon: Rusty Aus, DO;  Location: [facility];  Service: General;  Laterality: Right;   TEE WITHOUT CARDIOVERSION N/A 05/11/2018   Procedure: TRANSESOPHAGEAL ECHOCARDIOGRAM (TEE);  Surgeon: Prescott Gum, Collier Salina, MD;  Location: [facility];  Service: Open Heart Surgery;  Laterality: N/A;   VIDEO BRONCHOSCOPY WITH ENDOBRONCHIAL ULTRASOUND Right 11/17/2022   Procedure: VIDEO BRONCHOSCOPY WITH ENDOBRONCHIAL ULTRASOUND;  Surgeon: Collene Gobble, MD;  Location: [facility];  Service: Cardiopulmonary;  Laterality: Right;    Social History: Social History   Socioeconomic History   Marital status: Widowed    Spouse name: Not on file   Number of children: Not on file   Years of education: Not on file   Highest education level: Not on file  Occupational History   Not on file  Tobacco Use   Smoking status: Never   Smokeless tobacco: Never  Vaping Use   Vaping Use: Never used  Substance and Sexual Activity   Alcohol use: No    Alcohol/week: 0.0 standard drinks of alcohol   Drug use: No   Sexual activity: Yes  Other Topics Concern   Not on file  Social History Narrative   Not on file   Social Determinants of Health   Financial Resource Strain: Not on file  Food Insecurity: Not on file  Transportation Needs: Not on file  Physical Activity: Not on file  Stress: Not on file  Social Connections: Not on file  Intimate Partner Violence: Not on file    Family History: Family History  Problem Relation Age of Onset   Diabetes Mellitus II Mother    Heart attack Father    Heart attack Brother    Heart disease Other    Arthritis Other    Asthma Other    Diabetes Other     Current Medications:  Current Outpatient Medications:     acetaminophen (TYLENOL) 500 MG tablet, Take 1,000 mg by mouth every 6 (six) hours as needed for moderate pain or mild pain., Disp: , Rfl:    albuterol (VENTOLIN HFA) 108 (90 Base) MCG/ACT inhaler, Inhale 2 puffs into the lungs every 6 (six) hours as needed for wheezing or shortness of breath., Disp: , Rfl:  allopurinol (ZYLOPRIM) 300 MG tablet, Take 1 tablet (300 mg total) by mouth daily., Disp: 30 tablet, Rfl: 3   apixaban (ELIQUIS) 5 MG TABS tablet, Take 1 tablet (5 mg total) by mouth 2 (two) times daily., Disp: 60 tablet, Rfl: 5   atorvastatin (LIPITOR) 40 MG tablet, Take 40 mg by mouth at bedtime., Disp: , Rfl:    BENDAMUSTINE HCL IV, Inject into the vein every 28 (twenty-eight) days., Disp: , Rfl:    cyanocobalamin (VITAMIN B12) 1000 MCG tablet, Take 1,000 mcg by mouth daily., Disp: , Rfl:    cyanocobalamin (VITAMIN B12) 1000 MCG/ML injection, Inject 1,000 mcg into the skin every 30 (thirty) days., Disp: , Rfl:    docusate sodium (COLACE) 100 MG capsule, Take 1 capsule (100 mg total) by mouth 2 (two) times daily. (Patient taking differently: Take 100 mg by mouth 2 (two) times daily as needed for mild constipation or moderate constipation.), Disp: 60 capsule, Rfl: 2   dofetilide (TIKOSYN) 500 MCG capsule, Take 500 mcg by mouth 2 (two) times daily., Disp: , Rfl:    famotidine (PEPCID) 20 MG tablet, Take 1 tablet (20 mg total) by mouth 2 (two) times daily., Disp: , Rfl:    fluticasone (FLONASE) 50 MCG/ACT nasal spray, Place 1 spray into both nostrils daily., Disp: 11.1 mL, Rfl: 2   glipiZIDE (GLUCOTROL XL) 10 MG 24 hr tablet, Take 10 mg by mouth daily., Disp: , Rfl:    loratadine (CLARITIN) 10 MG tablet, Take 10 mg by mouth daily., Disp: , Rfl:    Melatonin 10 MG CAPS, Take 30 mg by mouth at bedtime as needed (sleep)., Disp: , Rfl:    metFORMIN (GLUCOPHAGE-XR) 750 MG 24 hr tablet, Take 750 mg by mouth 2 (two) times daily with a meal., Disp: , Rfl:    metFORMIN (GLUCOPHAGE-XR) 750 MG 24 hr  tablet, Take 1 tablet by mouth 2 (two) times daily with a meal., Disp: , Rfl:    oxyCODONE (ROXICODONE) 5 MG immediate release tablet, Take 1 tablet (5 mg total) by mouth every 6 (six) hours as needed. (Patient taking differently: Take 5 mg by mouth every 6 (six) hours as needed for severe pain or moderate pain.), Disp: 12 tablet, Rfl: 0   pantoprazole (PROTONIX) 40 MG tablet, Take 40 mg by mouth daily., Disp: , Rfl:    polyethylene glycol (MIRALAX / GLYCOLAX) 17 g packet, Take 17 g by mouth daily. (Patient taking differently: Take 17 g by mouth daily as needed for moderate constipation.), Disp: 30 each, Rfl: 0   potassium chloride (MICRO-K) 10 MEQ CR capsule, Take 10 mEq by mouth daily., Disp: , Rfl:    potassium chloride (MICRO-K) 10 MEQ CR capsule, Take 1 capsule by mouth daily., Disp: , Rfl:    Povidone, PF, (IVIZIA DRY EYES) 0.5 % SOLN, Place 1 drop into both eyes daily as needed (dry eyes)., Disp: , Rfl:    prochlorperazine (COMPAZINE) 10 MG tablet, Take 1 tablet (10 mg total) by mouth every 6 (six) hours as needed for nausea or vomiting., Disp: 60 tablet, Rfl: 1   riTUXimab (RITUXAN IV), Inject into the vein every 28 (twenty-eight) days., Disp: , Rfl:    sodium chloride (OCEAN) 0.65 % SOLN nasal spray, Place 1 spray into both nostrils as needed for congestion., Disp: 44 mL, Rfl: 2   spironolactone (ALDACTONE) 25 MG tablet, Take 0.5 tablets (12.5 mg total) by mouth daily., Disp: 45 tablet, Rfl: 3   valsartan (DIOVAN) 40 MG tablet, Take 1  tablet (40 mg total) by mouth daily., Disp: 30 tablet, Rfl: 11   ferrous sulfate 325 (65 FE) MG EC tablet, Take 1 tablet (325 mg total) by mouth daily with breakfast., Disp: 100 tablet, Rfl: 1   lidocaine-prilocaine (EMLA) cream, Apply to affected area once (Patient not taking: Reported on 02/04/2023), Disp: 30 g, Rfl: 3 No current facility-administered medications for this visit.  Facility-Administered Medications Ordered in Other Visits:    sodium chloride  flush (NS) 0.9 % injection 10 mL, 10 mL, Intracatheter, PRN, Derek Jack, MD, 10 mL at 01/07/23 1446   sodium chloride flush (NS) 0.9 % injection 10 mL, 10 mL, Intracatheter, PRN, Derek Jack, MD, 10 mL at 02/04/23 1444   Allergies: No Known Allergies  REVIEW OF SYSTEMS:   Review of Systems  Constitutional:  Positive for fatigue. Negative for chills and fever.  HENT:   Negative for lump/mass, mouth sores, nosebleeds, sore throat and trouble swallowing.   Eyes:  Negative for eye problems.  Respiratory:  Positive for shortness of breath. Negative for cough.   Cardiovascular:  Negative for chest pain, leg swelling and palpitations.  Gastrointestinal:  Negative for abdominal pain, constipation, diarrhea, nausea and vomiting.  Genitourinary:  Negative for bladder incontinence, difficulty urinating, dysuria, frequency, hematuria and nocturia.   Musculoskeletal:  Negative for arthralgias, back pain, flank pain, myalgias and neck pain.  Skin:  Negative for itching and rash.  Neurological:  Positive for headaches. Negative for dizziness and numbness.  Hematological:  Does not bruise/bleed easily.  Psychiatric/Behavioral:  Negative for depression, sleep disturbance and suicidal ideas. The patient is not nervous/anxious.   All other systems reviewed and are negative.    VITALS:   There were no vitals taken for this visit.  Wt Readings from Last 3 Encounters:  02/04/23 241 lb 6.4 oz (109.5 kg)  01/19/23 232 lb 9.6 oz (105.5 kg)  01/13/23 236 lb 1.6 oz (107.1 kg)    There is no height or weight on file to calculate BMI.  Performance status (ECOG): 1 - Symptomatic but completely ambulatory  PHYSICAL EXAM:   Physical Exam Vitals and nursing note reviewed. Exam conducted with a chaperone present.  Constitutional:      Appearance: Normal appearance.  Cardiovascular:     Rate and Rhythm: Normal rate and regular rhythm.     Pulses: Normal pulses.     Heart sounds: Normal  heart sounds.  Pulmonary:     Effort: Pulmonary effort is normal.     Breath sounds: Normal breath sounds.  Abdominal:     Palpations: Abdomen is soft. There is no hepatomegaly, splenomegaly or mass.     Tenderness: There is no abdominal tenderness.  Musculoskeletal:     Right lower leg: No edema.     Left lower leg: No edema.  Lymphadenopathy:     Cervical: No cervical adenopathy.     Right cervical: No superficial, deep or posterior cervical adenopathy.    Left cervical: No superficial, deep or posterior cervical adenopathy.     Upper Body:     Right upper body: No supraclavicular or axillary adenopathy.     Left upper body: No supraclavicular or axillary adenopathy.  Neurological:     General: No focal deficit present.     Mental Status: He is alert and oriented to person, place, and time.  Psychiatric:        Mood and Affect: Mood normal.        Behavior: Behavior normal.  LABS:      Latest Ref Rng & Units 02/04/2023    7:51 AM 01/19/2023    9:44 AM 01/05/2023    3:04 PM  CBC  WBC 4.0 - 10.5 K/uL 8.2  20.9  12.7   Hemoglobin 13.0 - 17.0 g/dL 11.4  13.0  13.1   Hematocrit 39.0 - 52.0 % 34.2  38.6  38.3   Platelets 150 - 400 K/uL 227  166  279       Latest Ref Rng & Units 02/04/2023    7:51 AM 01/19/2023    9:44 AM 01/05/2023    3:04 PM  CMP  Glucose 70 - 99 mg/dL 209  224  182   BUN 8 - 23 mg/dL '11  17  15   '$ Creatinine 0.61 - 1.24 mg/dL 0.96  1.27  1.06   Sodium 135 - 145 mmol/L 135  134  130   Potassium 3.5 - 5.1 mmol/L 3.7  4.0  3.9   Chloride 98 - 111 mmol/L 103  99  97   CO2 22 - 32 mmol/L '23  25  22   '$ Calcium 8.9 - 10.3 mg/dL 8.6  8.6  8.8   Total Protein 6.5 - 8.1 g/dL 6.6  7.1  7.6   Total Bilirubin 0.3 - 1.2 mg/dL 0.7  0.8  0.7   Alkaline Phos 38 - 126 U/L 83  130  105   AST 15 - 41 U/L '20  26  23   '$ ALT 0 - 44 U/L '16  30  19      '$ No results found for: "CEA1", "CEA" / No results found for: "CEA1", "CEA" No results found for: "PSA1" No results found  for: "[id number]" No results found for: "CAN125"  No results found for: "TOTALPROTELP", "ALBUMINELP", "A1GS", "A2GS", "BETS", "BETA2SER", "GAMS", "MSPIKE", "SPEI" Lab Results  Component Value Date   TIBC 361 02/04/2023   FERRITIN 47 02/04/2023   IRONPCTSAT 13 (L) 02/04/2023   Lab Results  Component Value Date   LDH 118 02/04/2023   LDH 146 01/19/2023   LDH 167 01/05/2023     STUDIES:   CT MAXILLOFACIAL W CONTRAST  Result Date: 01/29/2023 CLINICAL DATA:  Provided history: Subacute maxillary sinusitis. Sinusitis, immunocompromised (active chemotherapy for lymphoma). Additional history provided by the scanning technologist: Ongoing rhinorrhea, maxillary sinus pressure/pain. EXAM: CT MAXILLOFACIAL WITH CONTRAST TECHNIQUE: Multidetector CT images of the paranasal sinuses was performed according to the standard protocol following intravenous contrast administration. RADIATION DOSE REDUCTION: This exam was performed according to the departmental dose-optimization program which includes automated exposure control, adjustment of the mA and/or kV according to patient size and/or use of iterative reconstruction technique. CONTRAST:  35m OMNIPAQUE IOHEXOL 300 MG/ML  SOLN COMPARISON:  Head CT 05/13/2014. FINDINGS: Paranasal sinuses: Frontal: Mild mucosal thickening within the bilateral frontal sinuses. Patent frontal sinus drainage pathways. Ethmoid: Mucosal thickening scattered within bilateral ethmoid air cells, overall mild. Maxillary: Minimal mucosal thickening within the right maxillary sinus. Mild mucosal thickening within the left maxillary sinus. Sphenoid: Trace mucosal thickening within the bilateral sphenoid sinuses. Patent sphenoethmoidal recesses. Right ostiomeatal unit: Mucosal thickening narrows the ethmoid infundibulum. Otherwise patent. Left ostiomeatal unit: Patent. Nasal passages: Leftward deviation of the nasal septum. Mild mucosal thickening within the bilateral nasal passages. Anatomy:  Pneumatization is present superior to the anterior ethmoid notches, bilaterally. Symmetric and intact olfactory grooves and fovea ethmoidalis, Keros I (1-376m. Sellar sphenoid pneumatization pattern. 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 Background cerebral atrophy  and chronic small vessel image disease at the imaged levels. No orbital mass or acute orbital finding. 16 mm right forehead lipoma. Poor dentition with multifocal periapical lucencies. IMPRESSION: Mild paranasal sinus mucosal thickening, as detailed. No air-fluid levels. Mucosal thickening narrows the right ethmoid infundibulum. The sinus drainage pathways are otherwise patent. Leftward deviation of the nasal septum. Mild mucosal thickening within the bilateral nasal passages. Electronically Signed   By: Kellie Simmering D.O.   On: 01/29/2023 13:34   DG Chest Port 1 View  Result Date: 01/06/2023 CLINICAL DATA:  Port-A-Cath placement. EXAM: PORTABLE CHEST 1 VIEW COMPARISON:  CT chest 12/07/2022.  Chest x-ray 12/07/2022. FINDINGS: Port-A-Cath noted with tip over superior vena cava. Prior CABG. Cardiomegaly. No pulmonary venous congestion. Right perihilar mass best identified on prior recent CT of 12/07/2022. Low lung volumes with mild bibasilar atelectasis. No prominent pleural effusion or pneumothorax. IMPRESSION: 1. Port-A-Cath noted with tip over superior vena cava. No pneumothorax. 2. Prior CABG. Cardiomegaly.  No pulmonary venous congestion. 3. Right perihilar mass best identified on prior recent CT of 12/07/2022. Low lung volumes with mild bibasilar atelectasis. Electronically Signed   By: Marcello Moores  Register M.D.   On: 01/06/2023 08:49   DG C-Arm 1-60 Min-No Report  Result Date: 01/06/2023 Fluoroscopy was utilized by the requesting physician.  No radiographic interpretation.

## 2023-02-04 ENCOUNTER — Other Ambulatory Visit: Payer: Self-pay

## 2023-02-04 ENCOUNTER — Inpatient Hospital Stay: Payer: Medicare Other

## 2023-02-04 ENCOUNTER — Inpatient Hospital Stay (HOSPITAL_BASED_OUTPATIENT_CLINIC_OR_DEPARTMENT_OTHER): Payer: Medicare Other | Admitting: Hematology

## 2023-02-04 ENCOUNTER — Other Ambulatory Visit: Payer: Self-pay | Admitting: Physician Assistant

## 2023-02-04 ENCOUNTER — Inpatient Hospital Stay: Payer: Medicare Other | Attending: Hematology

## 2023-02-04 VITALS — BP 140/61 | HR 57 | Temp 97.7°F | Resp 18

## 2023-02-04 DIAGNOSIS — Z79899 Other long term (current) drug therapy: Secondary | ICD-10-CM | POA: Insufficient documentation

## 2023-02-04 DIAGNOSIS — E611 Iron deficiency: Secondary | ICD-10-CM

## 2023-02-04 DIAGNOSIS — C8238 Follicular lymphoma grade IIIa, lymph nodes of multiple sites: Secondary | ICD-10-CM

## 2023-02-04 DIAGNOSIS — R5383 Other fatigue: Secondary | ICD-10-CM

## 2023-02-04 DIAGNOSIS — R490 Dysphonia: Secondary | ICD-10-CM | POA: Insufficient documentation

## 2023-02-04 DIAGNOSIS — J8410 Pulmonary fibrosis, unspecified: Secondary | ICD-10-CM | POA: Insufficient documentation

## 2023-02-04 DIAGNOSIS — Z5111 Encounter for antineoplastic chemotherapy: Secondary | ICD-10-CM | POA: Insufficient documentation

## 2023-02-04 DIAGNOSIS — C8230 Follicular lymphoma grade IIIa, unspecified site: Secondary | ICD-10-CM | POA: Insufficient documentation

## 2023-02-04 LAB — CBC WITH DIFFERENTIAL/PLATELET
Abs Immature Granulocytes: 0.05 10*3/uL (ref 0.00–0.07)
Basophils Absolute: 0 10*3/uL (ref 0.0–0.1)
Basophils Relative: 0 %
Eosinophils Absolute: 0 10*3/uL (ref 0.0–0.5)
Eosinophils Relative: 1 %
HCT: 34.2 % — ABNORMAL LOW (ref 39.0–52.0)
Hemoglobin: 11.4 g/dL — ABNORMAL LOW (ref 13.0–17.0)
Immature Granulocytes: 1 %
Lymphocytes Relative: 16 %
Lymphs Abs: 1.3 10*3/uL (ref 0.7–4.0)
MCH: 30.1 pg (ref 26.0–34.0)
MCHC: 33.3 g/dL (ref 30.0–36.0)
MCV: 90.2 fL (ref 80.0–100.0)
Monocytes Absolute: 1 10*3/uL (ref 0.1–1.0)
Monocytes Relative: 12 %
Neutro Abs: 5.8 10*3/uL (ref 1.7–7.7)
Neutrophils Relative %: 70 %
Platelets: 227 10*3/uL (ref 150–400)
RBC: 3.79 MIL/uL — ABNORMAL LOW (ref 4.22–5.81)
RDW: 14.7 % (ref 11.5–15.5)
WBC: 8.2 10*3/uL (ref 4.0–10.5)
nRBC: 0 % (ref 0.0–0.2)

## 2023-02-04 LAB — COMPREHENSIVE METABOLIC PANEL
ALT: 16 U/L (ref 0–44)
AST: 20 U/L (ref 15–41)
Albumin: 3.4 g/dL — ABNORMAL LOW (ref 3.5–5.0)
Alkaline Phosphatase: 83 U/L (ref 38–126)
Anion gap: 9 (ref 5–15)
BUN: 11 mg/dL (ref 8–23)
CO2: 23 mmol/L (ref 22–32)
Calcium: 8.6 mg/dL — ABNORMAL LOW (ref 8.9–10.3)
Chloride: 103 mmol/L (ref 98–111)
Creatinine, Ser: 0.96 mg/dL (ref 0.61–1.24)
GFR, Estimated: >60 mL/min (ref >60)
Glucose, Bld: 209 mg/dL — ABNORMAL HIGH (ref 70–99)
Potassium: 3.7 mmol/L (ref 3.5–5.1)
Sodium: 135 mmol/L (ref 135–145)
Total Bilirubin: 0.7 mg/dL (ref 0.3–1.2)
Total Protein: 6.6 g/dL (ref 6.5–8.1)

## 2023-02-04 LAB — IRON AND TIBC
Iron: 47 ug/dL (ref 45–182)
Saturation Ratios: 13 % — ABNORMAL LOW (ref 17.9–39.5)
TIBC: 361 ug/dL (ref 250–450)
UIBC: 314 ug/dL

## 2023-02-04 LAB — URIC ACID: Uric Acid, Serum: 4.5 mg/dL (ref 3.7–8.6)

## 2023-02-04 LAB — MAGNESIUM: Magnesium: 1.7 mg/dL (ref 1.7–2.4)

## 2023-02-04 LAB — VITAMIN B12: Vitamin B-12: 670 pg/mL (ref 180–914)

## 2023-02-04 LAB — LACTATE DEHYDROGENASE: LDH: 118 U/L (ref 98–192)

## 2023-02-04 LAB — FERRITIN: Ferritin: 47 ng/mL (ref 24–336)

## 2023-02-04 MED ORDER — SODIUM CHLORIDE 0.9 % IV SOLN
60.0000 mg/m2 | Freq: Once | INTRAVENOUS | Status: AC
  Administered 2023-02-04: 140 mg via INTRAVENOUS
  Filled 2023-02-04: qty 5.6

## 2023-02-04 MED ORDER — SODIUM CHLORIDE 0.9 % IV SOLN
375.0000 mg/m2 | Freq: Once | INTRAVENOUS | Status: AC
  Administered 2023-02-04: 900 mg via INTRAVENOUS
  Filled 2023-02-04: qty 50

## 2023-02-04 MED ORDER — SODIUM CHLORIDE 0.9 % IV SOLN
10.0000 mg | Freq: Once | INTRAVENOUS | Status: AC
  Administered 2023-02-04: 10 mg via INTRAVENOUS
  Filled 2023-02-04: qty 10

## 2023-02-04 MED ORDER — PALONOSETRON HCL INJECTION 0.25 MG/5ML
0.2500 mg | Freq: Once | INTRAVENOUS | Status: AC
  Administered 2023-02-04: 0.25 mg via INTRAVENOUS
  Filled 2023-02-04: qty 5

## 2023-02-04 MED ORDER — FAMOTIDINE IN NACL 20-0.9 MG/50ML-% IV SOLN
20.0000 mg | Freq: Once | INTRAVENOUS | Status: AC
  Administered 2023-02-04: 20 mg via INTRAVENOUS
  Filled 2023-02-04: qty 50

## 2023-02-04 MED ORDER — ACETAMINOPHEN 325 MG PO TABS
650.0000 mg | ORAL_TABLET | Freq: Once | ORAL | Status: AC
  Administered 2023-02-04: 650 mg via ORAL
  Filled 2023-02-04: qty 2

## 2023-02-04 MED ORDER — CETIRIZINE HCL 10 MG/ML IV SOLN
10.0000 mg | Freq: Once | INTRAVENOUS | Status: AC
  Administered 2023-02-04: 10 mg via INTRAVENOUS
  Filled 2023-02-04: qty 1

## 2023-02-04 MED ORDER — SODIUM CHLORIDE 0.9% FLUSH
10.0000 mL | INTRAVENOUS | Status: DC | PRN
  Administered 2023-02-04: 10 mL

## 2023-02-04 MED ORDER — SODIUM CHLORIDE 0.9% FLUSH
10.0000 mL | Freq: Once | INTRAVENOUS | Status: AC
  Administered 2023-02-04: 10 mL via INTRAVENOUS

## 2023-02-04 MED ORDER — FERROUS SULFATE 325 (65 FE) MG PO TBEC: 325.0000 mg | DELAYED_RELEASE_TABLET | Freq: Every day | ORAL | 1 refills | Status: DC

## 2023-02-04 MED ORDER — SODIUM CHLORIDE 0.9 % IV SOLN: Freq: Once | INTRAVENOUS | Status: AC

## 2023-02-04 MED ORDER — HEPARIN SOD (PORK) LOCK FLUSH 100 UNIT/ML IV SOLN
500.0000 [IU] | Freq: Once | INTRAVENOUS | Status: AC | PRN
  Administered 2023-02-04: 500 [IU]

## 2023-02-04 NOTE — Progress Notes (Signed)
Patient presents today for chemotherapy infusion.  Patient is in satisfactory condition with no new complaints voiced.  Vital signs are stable.  Labs reviewed by Dr. Delton Coombes during his office visit.  All labs are within treatment parameters.  We will proceed with treatment per MD orders.   Patient tolerated treatment well with no complaints voiced.  Patient left via wheelchair with niece in stable condition.  Vital signs stable at discharge.  Follow up as scheduled.

## 2023-02-04 NOTE — Progress Notes (Signed)
Iron levels are borderline low.  Ferrous sulfate 325 mg daily sent to pharmacy.  IV iron would be second choice if no improvement or poor tolerance of oral iron.

## 2023-02-04 NOTE — Progress Notes (Signed)
Patient has been examined by Dr. Delton Coombes. Vital signs and labs have been reviewed by MD - ANC, Creatinine, LFTs, hemoglobin, and platelets are within treatment parameters per M.D. - pt may proceed with treatment.  Primary RN and pharmacy notified.

## 2023-02-04 NOTE — Patient Instructions (Addendum)
[place] at [facility] Discharge Instructions   You were seen and examined today by Dr. Delton Coombes.  He reviewed the results of your lab work which are normal/stable. The results of your iron lab work and other labs for anemia work up are pending.   We will proceed with your treatment today.   Return as scheduled.    Thank you for choosing [place] at [facility] to provide your oncology and hematology care.  To afford each patient quality time with our provider, please arrive at least 15 minutes before your scheduled appointment time.   If you have a lab appointment with the [facility] please come in thru the Main Entrance and check in at the main information desk.  You need to re-schedule your appointment should you arrive 10 or more minutes late.  We strive to give you quality time with our providers, and arriving late affects you and other patients whose appointments are after yours.  Also, if you no show three or more times for appointments you may be dismissed from the clinic at the providers discretion.     Again, thank you for choosing [facility].  Our hope is that these requests will decrease the amount of time that you wait before being seen by our physicians.       _____________________________________________________________  Should you have questions after your visit to [facility], please contact our office at [phone] and follow the prompts.  Our office hours are 8:00 a.m. and 4:30 p.m. Monday - Friday.  Please note that voicemails left after 4:00 p.m. may not be returned until the following business day.  We are closed weekends and major holidays.  You do have access to a nurse 24-7, just call the main number to the clinic [phone] and do not press any options, hold on the line and a nurse will answer the phone.    For prescription refill requests, have your pharmacy contact our office  and allow 72 hours.    Due to Covid, you will need to wear a mask upon entering the hospital. If you do not have a mask, a mask will be given to you at the Main Entrance upon arrival. For doctor visits, patients may have 1 support person age 62 or older with them. For treatment visits, patients can not have anyone with them due to social distancing guidelines and our immunocompromised population.

## 2023-02-04 NOTE — Progress Notes (Signed)
MD confirmed dose increase for Bendeka at 60 mg/m2.  Henreitta Leber, PharmD

## 2023-02-04 NOTE — Patient Instructions (Signed)
[place]  Discharge Instructions: Thank you for choosing [facility] to provide your oncology and hematology care.  If you have a lab appointment with the [place], please come in thru the Main Entrance and check in at the main information desk.  Wear comfortable clothing and clothing appropriate for easy access to any Portacath or PICC line.   We strive to give you quality time with your provider. You may need to reschedule your appointment if you arrive late (15 or more minutes).  Arriving late affects you and other patients whose appointments are after yours.  Also, if you miss three or more appointments without notifying the office, you may be dismissed from the clinic at the provider's discretion.      For prescription refill requests, have your pharmacy contact our office and allow 72 hours for refills to be completed.    Today you received the following chemotherapy and/or immunotherapy agents Rituxan/Bendeka.  Rituximab Injection What is this medication? RITUXIMAB (ri TUX i mab) treats leukemia and lymphoma. It works by blocking a protein that causes cancer cells to grow and multiply. This helps to slow or stop the spread of cancer cells. It may also be used to treat autoimmune conditions, such as arthritis. It works by slowing down an overactive immune system. It is a monoclonal antibody. This medicine may be used for other purposes; ask your health care provider or pharmacist if you have questions. COMMON BRAND NAME(S): RIABNI, Rituxan, RUXIENCE, truxima What should I tell my care team before I take this medication? They need to know if you have any of these conditions: Chest pain Heart disease Immune system problems Infection, such as chickenpox, cold sores, hepatitis B, herpes Irregular heartbeat or rhythm Kidney disease Low blood counts, such as low white cells, platelets, red cells Lung disease Recent or upcoming vaccine An unusual  or allergic reaction to rituximab, other medications, foods, dyes, or preservatives Pregnant or trying to get pregnant Breast-feeding How should I use this medication? This medication is injected into a vein. It is given by a care team in a hospital or clinic setting. A special MedGuide will be given to you before each treatment. Be sure to read this information carefully each time. Talk to your care team about the use of this medication in children. While this medication may be prescribed for children as young as 6 months for selected conditions, precautions do apply. Overdosage: If you think you have taken too much of this medicine contact a poison control center or emergency room at once. NOTE: This medicine is only for you. Do not share this medicine with others. What if I miss a dose? Keep appointments for follow-up doses. It is important not to miss your dose. Call your care team if you are unable to keep an appointment. What may interact with this medication? Do not take this medication with any of the following: Live vaccines This medication may also interact with the following: Cisplatin This list may not describe all possible interactions. Give your health care provider a list of all the medicines, herbs, non-prescription drugs, or dietary supplements you use. Also tell them if you smoke, drink alcohol, or use illegal drugs. Some items may interact with your medicine. What should I watch for while using this medication? Your condition will be monitored carefully while you are receiving this medication. You may need blood work while taking this medication. This medication can cause serious infusion reactions. To reduce the  risk your care team may give you other medications to take before receiving this one. Be sure to follow the directions from your care team. This medication may increase your risk of getting an infection. Call your care team for advice if you get a fever, chills, sore  throat, or other symptoms of a cold or flu. Do not treat yourself. Try to avoid being around people who are sick. Call your care team if you are around anyone with measles, chickenpox, or if you develop sores or blisters that do not heal properly. Avoid taking medications that contain aspirin, acetaminophen, ibuprofen, naproxen, or ketoprofen unless instructed by your care team. These medications may hide a fever. This medication may cause serious skin reactions. They can happen weeks to months after starting the medication. Contact your care team right away if you notice fevers or flu-like symptoms with a rash. The rash may be red or purple and then turn into blisters or peeling of the skin. You may also notice a red rash with swelling of the face, lips, or lymph nodes in your neck or under your arms. In some patients, this medication may cause a serious brain infection that may cause death. If you have any problems seeing, thinking, speaking, walking, or standing, tell your care team right away. If you cannot reach your care team, urgently seek another source of medical care. Talk to your care team if you may be pregnant. Serious birth defects can occur if you take this medication during pregnancy and for 12 months after the last dose. You will need a negative pregnancy test before starting this medication. Contraception is recommended while taking this medication and for 12 months after the last dose. Your care team can help you find the option that works for you. Do not breastfeed while taking this medication and for at least 6 months after the last dose. What side effects may I notice from receiving this medication? Side effects that you should report to your care team as soon as possible: Allergic reactions or angioedema--skin rash, itching or hives, swelling of the face, eyes, lips, tongue, arms, or legs, trouble swallowing or breathing Bowel blockage--stomach cramping, unable to have a bowel  movement or pass gas, loss of appetite, vomiting Dizziness, loss of balance or coordination, confusion or trouble speaking Heart attack--pain or tightness in the chest, shoulders, arms, or jaw, nausea, shortness of breath, cold or clammy skin, feeling faint or lightheaded Heart rhythm changes--fast or irregular heartbeat, dizziness, feeling faint or lightheaded, chest pain, trouble breathing Infection--fever, chills, cough, sore throat, wounds that don't heal, pain or trouble when passing urine, general feeling of discomfort or being unwell Infusion reactions--chest pain, shortness of breath or trouble breathing, feeling faint or lightheaded Kidney injury--decrease in the amount of urine, swelling of the ankles, hands, or feet Liver injury--right upper belly pain, loss of appetite, nausea, light-colored stool, dark yellow or Moneisha Vosler urine, yellowing skin or eyes, unusual weakness or fatigue Redness, blistering, peeling, or loosening of the skin, including inside the mouth Stomach pain that is severe, does not go away, or gets worse Tumor lysis syndrome (TLS)--nausea, vomiting, diarrhea, decrease in the amount of urine, dark urine, unusual weakness or fatigue, confusion, muscle pain or cramps, fast or irregular heartbeat, joint pain Side effects that usually do not require medical attention (report to your care team if they continue or are bothersome): Headache Joint pain Nausea Runny or stuffy nose Unusual weakness or fatigue This list may not describe all possible side  effects. Call your doctor for medical advice about side effects. You may report side effects to FDA at 1-800-FDA-1088. Where should I keep my medication? This medication is given in a hospital or clinic. It will not be stored at home. NOTE: This sheet is a summary. It may not cover all possible information. If you have questions about this medicine, talk to your doctor, pharmacist, or health care provider.  2023 Elsevier/Gold  Standard (2022-04-01 00:00:00)    Bendamustine Injection What is this medication? BENDAMUSTINE (BEN da MUS teen) treats leukemia and lymphoma. It works by slowing down the growth of cancer cells. This medicine may be used for other purposes; ask your health care provider or pharmacist if you have questions. COMMON BRAND NAME(S): Oren Beckmann, VIVIMUSTA What should I tell my care team before I take this medication? They need to know if you have any of these conditions: Infection, especially a viral infection, such as chickenpox, cold sores, herpes Kidney disease Liver disease An unusual or allergic reaction to bendamustine, mannitol, other medications, foods, dyes, or preservatives Pregnant or trying to get pregnant Breast-feeding How should I use this medication? This medication is injected into a vein. It is given by your care team in a hospital or clinic setting. Talk to your care team about the use of this medication in children. Special care may be needed. Overdosage: If you think you have taken too much of this medicine contact a poison control center or emergency room at once. NOTE: This medicine is only for you. Do not share this medicine with others. What if I miss a dose? Keep appointments for follow-up doses. It is important not to miss your dose. Call your care team if you are unable to keep an appointment. What may interact with this medication? Do not take this medication with any of the following: Clozapine This medication may also interact with the following: Atazanavir Cimetidine Ciprofloxacin Enoxacin Fluvoxamine Medications for seizures, such as carbamazepine, phenobarbital Mexiletine Rifampin Tacrine Thiabendazole Zileuton This list may not describe all possible interactions. Give your health care provider a list of all the medicines, herbs, non-prescription drugs, or dietary supplements you use. Also tell them if you smoke, drink alcohol, or  use illegal drugs. Some items may interact with your medicine. What should I watch for while using this medication? Visit your care team for regular checks on your progress. This medication may make you feel generally unwell. This is not uncommon, as chemotherapy can affect healthy cells as well as cancer cells. Report any side effects. Continue your course of treatment even though you feel ill unless your care team tells you to stop. You may need blood work while taking this medication. This medication may increase your risk of getting an infection. Call your care team for advice if you get a fever, chills, sore throat, or other symptoms of a cold or flu. Do not treat yourself. Try to avoid being around people who are sick. This medication may cause serious skin reactions. They can happen weeks to months after starting the medication. Contact your care team right away if you notice fevers or flu-like symptoms with a rash. The rash may be red or purple and then turn into blisters or peeling of the skin. You may also notice a red rash with swelling of the face, lips, or lymph nodes in your neck or under your arms. In some patients, this medication may cause a serious brain infection that may cause death. If you have any  problems seeing, thinking, speaking, walking, or standing, tell your care team right away. If you cannot reach your care team, urgently seek other source of medical care. This medication may increase your risk to bruise or bleed. Call your care team if you notice any unusual bleeding. Talk to your care team about your risk of cancer. You may be more at risk for certain types of cancer if you take this medication. Talk to your care team about your risk of skin cancer. You may be more at risk for skin cancer if you take this medication. Talk to your care team if you or your partner wish to become pregnant or think either of you might be pregnant. This medication can cause serious birth defects  if taken during pregnancy or for up to 6 months after the last dose. A negative pregnancy test is required before starting this medication. A reliable form of contraception is recommended while taking this medication and for 6 months after the last dose. Talk to your care team about reliable forms of contraception. Wear a condom while taking this medication and for at least 3 months after the last dose. Do not breast-feed while taking this medication or for at least 1 week after the last dose. This medication may cause infertility. Talk to your care team if you are concerned about your fertility. What side effects may I notice from receiving this medication? Side effects that you should report to your care team as soon as possible: Allergic reactions--skin rash, itching, hives, swelling of the face, lips, tongue, or throat Infection--fever, chills, cough, sore throat, wounds that don't heal, pain or trouble when passing urine, general feeling of discomfort or being unwell Infusion reactions--chest pain, shortness of breath or trouble breathing, feeling faint or lightheaded Liver injury--right upper belly pain, loss of appetite, nausea, light-colored stool, dark yellow or Shalia Bartko urine, yellowing skin or eyes, unusual weakness or fatigue Low red blood cell level--unusual weakness or fatigue, dizziness, headache, trouble breathing Painful swelling, warmth, or redness of the skin, blisters or sores at the infusion site Rash, fever, and swollen lymph nodes Redness, blistering, peeling, or loosening of the skin, including inside the mouth Tumor lysis syndrome (TLS)--nausea, vomiting, diarrhea, decrease in the amount of urine, dark urine, unusual weakness or fatigue, confusion, muscle pain or cramps, fast or irregular heartbeat, joint pain Unusual bruising or bleeding Side effects that usually do not require medical attention (report to your care team if they continue or are  bothersome): Diarrhea Fatigue Headache Loss of appetite Nausea Vomiting This list may not describe all possible side effects. Call your doctor for medical advice about side effects. You may report side effects to FDA at 1-800-FDA-1088. Where should I keep my medication? This medication is given in a hospital or clinic. It will not be stored at home. NOTE: This sheet is a summary. It may not cover all possible information. If you have questions about this medicine, talk to your doctor, pharmacist, or health care provider.  2023 Elsevier/Gold Standard (2022-02-24 00:00:00)     To help prevent nausea and vomiting after your treatment, we encourage you to take your nausea medication as directed.  BELOW ARE SYMPTOMS THAT SHOULD BE REPORTED IMMEDIATELY: *FEVER GREATER THAN 100.4 F (38 C) OR HIGHER *CHILLS OR SWEATING *NAUSEA AND VOMITING THAT IS NOT CONTROLLED WITH YOUR NAUSEA MEDICATION *UNUSUAL SHORTNESS OF BREATH *UNUSUAL BRUISING OR BLEEDING *URINARY PROBLEMS (pain or burning when urinating, or frequent urination) *BOWEL PROBLEMS (unusual diarrhea, constipation, pain near the  anus) TENDERNESS IN MOUTH AND THROAT WITH OR WITHOUT PRESENCE OF ULCERS (sore throat, sores in mouth, or a toothache) UNUSUAL RASH, SWELLING OR PAIN  UNUSUAL VAGINAL DISCHARGE OR ITCHING   Items with * indicate a potential emergency and should be followed up as soon as possible or go to the Emergency Department if any problems should occur.  Please show the CHEMOTHERAPY ALERT CARD or IMMUNOTHERAPY ALERT CARD at check-in to the Emergency Department and triage nurse.  Should you have questions after your visit or need to cancel or reschedule your appointment, please contact [facility] [phone]  and follow the prompts.  Office hours are 8:00 a.m. to 4:30 p.m. Monday - Friday. Please note that voicemails left after 4:00 p.m. may not be returned until the following business day.  We are  closed weekends and major holidays. You have access to a nurse at all times for urgent questions. Please call the main number to the clinic [phone] and follow the prompts.  For any non-urgent questions, you may also contact your provider using MyChart. We now offer e-Visits for anyone 28 and older to request care online for non-urgent symptoms. For details visit [email or web address].   Also download the MyChart app! Go to the app store, search "MyChart", open the app, select [place], and log in with your MyChart username and password.

## 2023-02-05 ENCOUNTER — Inpatient Hospital Stay: Payer: Medicare Other

## 2023-02-05 VITALS — BP 135/59 | HR 50 | Temp 96.9°F | Resp 16

## 2023-02-05 DIAGNOSIS — C8238 Follicular lymphoma grade IIIa, lymph nodes of multiple sites: Secondary | ICD-10-CM

## 2023-02-05 DIAGNOSIS — Z5111 Encounter for antineoplastic chemotherapy: Secondary | ICD-10-CM | POA: Diagnosis not present

## 2023-02-05 MED ORDER — SODIUM CHLORIDE 0.9 % IV SOLN: Freq: Once | INTRAVENOUS | Status: AC

## 2023-02-05 MED ORDER — HEPARIN SOD (PORK) LOCK FLUSH 100 UNIT/ML IV SOLN
500.0000 [IU] | Freq: Once | INTRAVENOUS | Status: AC | PRN
  Administered 2023-02-05: 500 [IU]

## 2023-02-05 MED ORDER — SODIUM CHLORIDE 0.9 % IV SOLN
60.0000 mg/m2 | Freq: Once | INTRAVENOUS | Status: AC
  Administered 2023-02-05: 140 mg via INTRAVENOUS
  Filled 2023-02-05: qty 5.6

## 2023-02-05 MED ORDER — SODIUM CHLORIDE 0.9 % IV SOLN
10.0000 mg | Freq: Once | INTRAVENOUS | Status: AC
  Administered 2023-02-05: 10 mg via INTRAVENOUS
  Filled 2023-02-05: qty 10

## 2023-02-05 MED ORDER — SODIUM CHLORIDE 0.9% FLUSH
10.0000 mL | INTRAVENOUS | Status: DC | PRN
  Administered 2023-02-05: 10 mL

## 2023-02-05 NOTE — Patient Instructions (Signed)
[place]  Discharge Instructions: Thank you for choosing [facility] to provide your oncology and hematology care.  If you have a lab appointment with the [facility], please come in thru the Main Entrance and check in at the main information desk.  Wear comfortable clothing and clothing appropriate for easy access to any Portacath or PICC line.   We strive to give you quality time with your provider. You may need to reschedule your appointment if you arrive late (15 or more minutes).  Arriving late affects you and other patients whose appointments are after yours.  Also, if you miss three or more appointments without notifying the office, you may be dismissed from the clinic at the provider's discretion.      For prescription refill requests, have your pharmacy contact our office and allow 72 hours for refills to be completed.    Today you received the following chemotherapy and/or immunotherapy agents Bendeka      To help prevent nausea and vomiting after your treatment, we encourage you to take your nausea medication as directed.  BELOW ARE SYMPTOMS THAT SHOULD BE REPORTED IMMEDIATELY: *FEVER GREATER THAN 100.4 F (38 C) OR HIGHER *CHILLS OR SWEATING *NAUSEA AND VOMITING THAT IS NOT CONTROLLED WITH YOUR NAUSEA MEDICATION *UNUSUAL SHORTNESS OF BREATH *UNUSUAL BRUISING OR BLEEDING *URINARY PROBLEMS (pain or burning when urinating, or frequent urination) *BOWEL PROBLEMS (unusual diarrhea, constipation, pain near the anus) TENDERNESS IN MOUTH AND THROAT WITH OR WITHOUT PRESENCE OF ULCERS (sore throat, sores in mouth, or a toothache) UNUSUAL RASH, SWELLING OR PAIN  UNUSUAL VAGINAL DISCHARGE OR ITCHING   Items with * indicate a potential emergency and should be followed up as soon as possible or go to the Emergency Department if any problems should occur.  Please show the CHEMOTHERAPY ALERT CARD or IMMUNOTHERAPY ALERT CARD at check-in to the Emergency  Department and triage nurse.  Should you have questions after your visit or need to cancel or reschedule your appointment, please contact [facility] [phone]  and follow the prompts.  Office hours are 8:00 a.m. to 4:30 p.m. Monday - Friday. Please note that voicemails left after 4:00 p.m. may not be returned until the following business day.  We are closed weekends and major holidays. You have access to a nurse at all times for urgent questions. Please call the main number to the clinic [phone] and follow the prompts.  For any non-urgent questions, you may also contact your provider using MyChart. We now offer e-Visits for anyone 54 and older to request care online for non-urgent symptoms. For details visit [email or web address].   Also download the MyChart app! Go to the app store, search "MyChart", open the app, select [place], and log in with your MyChart username and password.

## 2023-02-05 NOTE — Progress Notes (Signed)
Patient presents today for Bendka infusion per providers order.  Vital signs within parameters for treatment.  Patient has no new complaints at this time.  Treatment given today per MD orders.  Stable during infusion without adverse affects.  Vital signs stable.  No complaints at this time.  Discharge from clinic ambulatory in stable condition.  Alert and oriented X 3.  Follow up with [facility] as scheduled.

## 2023-02-06 ENCOUNTER — Inpatient Hospital Stay: Payer: Medicare Other

## 2023-02-06 VITALS — BP 162/56 | HR 48 | Temp 97.0°F | Resp 16

## 2023-02-06 DIAGNOSIS — C8238 Follicular lymphoma grade IIIa, lymph nodes of multiple sites: Secondary | ICD-10-CM

## 2023-02-06 DIAGNOSIS — Z5111 Encounter for antineoplastic chemotherapy: Secondary | ICD-10-CM | POA: Diagnosis not present

## 2023-02-06 MED ORDER — PEGFILGRASTIM-CBQV 6 MG/0.6ML ~~LOC~~ SOSY
6.0000 mg | PREFILLED_SYRINGE | Freq: Once | SUBCUTANEOUS | Status: AC
  Administered 2023-02-06: 6 mg via SUBCUTANEOUS
  Filled 2023-02-06: qty 0.6

## 2023-02-06 NOTE — Patient Instructions (Addendum)
[facility]  Discharge Instructions: Thank you for choosing [facility] to provide your oncology and hematology care.  If you have a lab appointment with the [facility], please come in thru the Main Entrance and check in at the main information desk.  Wear comfortable clothing and clothing appropriate for easy access to any Portacath or PICC line.   We strive to give you quality time with your provider. You may need to reschedule your appointment if you arrive late (15 or more minutes).  Arriving late affects you and other patients whose appointments are after yours.  Also, if you miss three or more appointments without notifying the office, you may be dismissed from the clinic at the provider's discretion.      For prescription refill requests, have your pharmacy contact our office and allow 72 hours for refills to be completed.    Today you received the following udenyca   To help prevent nausea and vomiting after your treatment, we encourage you to take your nausea medication as directed.  BELOW ARE SYMPTOMS THAT SHOULD BE REPORTED IMMEDIATELY: *FEVER GREATER THAN 100.4 F (38 C) OR HIGHER *CHILLS OR SWEATING *NAUSEA AND VOMITING THAT IS NOT CONTROLLED WITH YOUR NAUSEA MEDICATION *UNUSUAL SHORTNESS OF BREATH *UNUSUAL BRUISING OR BLEEDING *URINARY PROBLEMS (pain or burning when urinating, or frequent urination) *BOWEL PROBLEMS (unusual diarrhea, constipation, pain near the anus) TENDERNESS IN MOUTH AND THROAT WITH OR WITHOUT PRESENCE OF ULCERS (sore throat, sores in mouth, or a toothache) UNUSUAL RASH, SWELLING OR PAIN  UNUSUAL VAGINAL DISCHARGE OR ITCHING   Items with * indicate a potential emergency and should be followed up as soon as possible or go to the Emergency Department if any problems should occur.  Please show the CHEMOTHERAPY ALERT CARD or IMMUNOTHERAPY ALERT CARD at check-in to the Emergency Department and triage nurse.  Should you  have questions after your visit or need to cancel or reschedule your appointment, please contact [facility] [phone]  and follow the prompts.  Office hours are 8:00 a.m. to 4:30 p.m. Monday - Friday. Please note that voicemails left after 4:00 p.m. may not be returned until the following business day.  We are closed weekends and major holidays. You have access to a nurse at all times for urgent questions. Please call the main number to the clinic [phone] and follow the prompts.  For any non-urgent questions, you may also contact your provider using MyChart. We now offer e-Visits for anyone 9 and older to request care online for non-urgent symptoms. For details visit [email or web address].   Also download the MyChart app! Go to the app store, search "MyChart", open the app, select [facility], and log in with your MyChart username and password.

## 2023-02-06 NOTE — Progress Notes (Signed)
Udenyca injection given per orders. Patient tolerated it well without problems. Vitals stable and discharged home from clinic via wheelchair.  Follow up as scheduled.  

## 2023-02-09 LAB — METHYLMALONIC ACID, SERUM: Methylmalonic Acid, Quantitative: 127 nmol/L (ref 0–378)

## 2023-02-14 ENCOUNTER — Other Ambulatory Visit: Payer: Self-pay

## 2023-02-14 ENCOUNTER — Encounter (HOSPITAL_COMMUNITY): Payer: Self-pay | Admitting: Emergency Medicine

## 2023-02-14 ENCOUNTER — Emergency Department (HOSPITAL_COMMUNITY): Payer: Medicare Other

## 2023-02-14 ENCOUNTER — Inpatient Hospital Stay (HOSPITAL_COMMUNITY)
Admission: EM | Admit: 2023-02-14 | Discharge: 2023-02-17 | DRG: 291 | Disposition: A | Discharge status: Home Health | Payer: Medicare Other | Attending: Internal Medicine | Admitting: Internal Medicine

## 2023-02-14 DIAGNOSIS — I4721 Torsades de pointes: Secondary | ICD-10-CM | POA: Diagnosis present

## 2023-02-14 DIAGNOSIS — E7800 Pure hypercholesterolemia, unspecified: Secondary | ICD-10-CM | POA: Diagnosis present

## 2023-02-14 DIAGNOSIS — N1832 Chronic kidney disease, stage 3b: Secondary | ICD-10-CM | POA: Diagnosis present

## 2023-02-14 DIAGNOSIS — I10 Essential (primary) hypertension: Secondary | ICD-10-CM | POA: Diagnosis present

## 2023-02-14 DIAGNOSIS — Q2543 Congenital aneurysm of aorta: Secondary | ICD-10-CM | POA: Diagnosis not present

## 2023-02-14 DIAGNOSIS — C8230 Follicular lymphoma grade IIIa, unspecified site: Secondary | ICD-10-CM | POA: Diagnosis present

## 2023-02-14 DIAGNOSIS — I255 Ischemic cardiomyopathy: Secondary | ICD-10-CM | POA: Diagnosis present

## 2023-02-14 DIAGNOSIS — E1122 Type 2 diabetes mellitus with diabetic chronic kidney disease: Secondary | ICD-10-CM | POA: Diagnosis present

## 2023-02-14 DIAGNOSIS — Z79899 Other long term (current) drug therapy: Secondary | ICD-10-CM | POA: Diagnosis not present

## 2023-02-14 DIAGNOSIS — Z8249 Family history of ischemic heart disease and other diseases of the circulatory system: Secondary | ICD-10-CM | POA: Diagnosis not present

## 2023-02-14 DIAGNOSIS — I5033 Acute on chronic diastolic (congestive) heart failure: Secondary | ICD-10-CM | POA: Insufficient documentation

## 2023-02-14 DIAGNOSIS — Z7901 Long term (current) use of anticoagulants: Secondary | ICD-10-CM

## 2023-02-14 DIAGNOSIS — I130 Hypertensive heart and chronic kidney disease with heart failure and stage 1 through stage 4 chronic kidney disease, or unspecified chronic kidney disease: Principal | ICD-10-CM | POA: Diagnosis present

## 2023-02-14 DIAGNOSIS — Z7984 Long term (current) use of oral hypoglycemic drugs: Secondary | ICD-10-CM

## 2023-02-14 DIAGNOSIS — I2510 Atherosclerotic heart disease of native coronary artery without angina pectoris: Secondary | ICD-10-CM | POA: Diagnosis present

## 2023-02-14 DIAGNOSIS — Z833 Family history of diabetes mellitus: Secondary | ICD-10-CM

## 2023-02-14 DIAGNOSIS — J9601 Acute respiratory failure with hypoxia: Secondary | ICD-10-CM | POA: Diagnosis present

## 2023-02-14 DIAGNOSIS — Z794 Long term (current) use of insulin: Secondary | ICD-10-CM | POA: Diagnosis not present

## 2023-02-14 DIAGNOSIS — E119 Type 2 diabetes mellitus without complications: Secondary | ICD-10-CM

## 2023-02-14 DIAGNOSIS — N183 Chronic kidney disease, stage 3 unspecified: Secondary | ICD-10-CM | POA: Diagnosis present

## 2023-02-14 DIAGNOSIS — Z951 Presence of aortocoronary bypass graft: Secondary | ICD-10-CM | POA: Diagnosis not present

## 2023-02-14 DIAGNOSIS — E876 Hypokalemia: Secondary | ICD-10-CM | POA: Diagnosis present

## 2023-02-14 DIAGNOSIS — E669 Obesity, unspecified: Secondary | ICD-10-CM | POA: Diagnosis present

## 2023-02-14 DIAGNOSIS — Z9221 Personal history of antineoplastic chemotherapy: Secondary | ICD-10-CM

## 2023-02-14 DIAGNOSIS — I493 Ventricular premature depolarization: Secondary | ICD-10-CM | POA: Diagnosis present

## 2023-02-14 DIAGNOSIS — E1159 Type 2 diabetes mellitus with other circulatory complications: Secondary | ICD-10-CM | POA: Diagnosis not present

## 2023-02-14 DIAGNOSIS — Z825 Family history of asthma and other chronic lower respiratory diseases: Secondary | ICD-10-CM

## 2023-02-14 DIAGNOSIS — K219 Gastro-esophageal reflux disease without esophagitis: Secondary | ICD-10-CM | POA: Diagnosis present

## 2023-02-14 DIAGNOSIS — E785 Hyperlipidemia, unspecified: Secondary | ICD-10-CM | POA: Diagnosis present

## 2023-02-14 DIAGNOSIS — I25810 Atherosclerosis of coronary artery bypass graft(s) without angina pectoris: Secondary | ICD-10-CM | POA: Diagnosis not present

## 2023-02-14 DIAGNOSIS — J189 Pneumonia, unspecified organism: Principal | ICD-10-CM

## 2023-02-14 DIAGNOSIS — Z6832 Body mass index (BMI) 32.0-32.9, adult: Secondary | ICD-10-CM | POA: Diagnosis not present

## 2023-02-14 DIAGNOSIS — I480 Paroxysmal atrial fibrillation: Secondary | ICD-10-CM | POA: Diagnosis present

## 2023-02-14 DIAGNOSIS — Z1152 Encounter for screening for COVID-19: Secondary | ICD-10-CM | POA: Diagnosis not present

## 2023-02-14 DIAGNOSIS — I5021 Acute systolic (congestive) heart failure: Secondary | ICD-10-CM | POA: Diagnosis not present

## 2023-02-14 DIAGNOSIS — I509 Heart failure, unspecified: Secondary | ICD-10-CM | POA: Diagnosis not present

## 2023-02-14 DIAGNOSIS — H04123 Dry eye syndrome of bilateral lacrimal glands: Secondary | ICD-10-CM | POA: Diagnosis present

## 2023-02-14 DIAGNOSIS — R0609 Other forms of dyspnea: Secondary | ICD-10-CM | POA: Diagnosis not present

## 2023-02-14 DIAGNOSIS — I080 Rheumatic disorders of both mitral and aortic valves: Secondary | ICD-10-CM | POA: Diagnosis present

## 2023-02-14 DIAGNOSIS — I4729 Other ventricular tachycardia: Secondary | ICD-10-CM | POA: Diagnosis not present

## 2023-02-14 HISTORY — DX: Malignant (primary) neoplasm, unspecified: C80.1

## 2023-02-14 LAB — CBC WITH DIFFERENTIAL/PLATELET
Abs Immature Granulocytes: 1.45 10*3/uL — ABNORMAL HIGH (ref 0.00–0.07)
Basophils Absolute: 0.1 10*3/uL (ref 0.0–0.1)
Basophils Relative: 1 %
Eosinophils Absolute: 0.1 10*3/uL (ref 0.0–0.5)
Eosinophils Relative: 0 %
HCT: 34.4 % — ABNORMAL LOW (ref 39.0–52.0)
Hemoglobin: 11.4 g/dL — ABNORMAL LOW (ref 13.0–17.0)
Immature Granulocytes: 6 %
Lymphocytes Relative: 5 %
Lymphs Abs: 1.2 10*3/uL (ref 0.7–4.0)
MCH: 30.3 pg (ref 26.0–34.0)
MCHC: 33.1 g/dL (ref 30.0–36.0)
MCV: 91.5 fL (ref 80.0–100.0)
Monocytes Absolute: 1.7 10*3/uL — ABNORMAL HIGH (ref 0.1–1.0)
Monocytes Relative: 7 %
Neutro Abs: 20.5 10*3/uL — ABNORMAL HIGH (ref 1.7–7.7)
Neutrophils Relative %: 81 %
Platelets: 156 10*3/uL (ref 150–400)
RBC: 3.76 MIL/uL — ABNORMAL LOW (ref 4.22–5.81)
RDW: 15.2 % (ref 11.5–15.5)
WBC: 25.1 10*3/uL — ABNORMAL HIGH (ref 4.0–10.5)
nRBC: 0 % (ref 0.0–0.2)

## 2023-02-14 LAB — LACTIC ACID, PLASMA
Lactic Acid, Venous: 2.1 mmol/L (ref 0.5–1.9)
Lactic Acid, Venous: 1.6 mmol/L (ref 0.5–1.9)

## 2023-02-14 LAB — COMPREHENSIVE METABOLIC PANEL
ALT: 24 U/L (ref 0–44)
AST: 25 U/L (ref 15–41)
Albumin: 3.4 g/dL — ABNORMAL LOW (ref 3.5–5.0)
Alkaline Phosphatase: 132 U/L — ABNORMAL HIGH (ref 38–126)
Anion gap: 10 (ref 5–15)
BUN: 11 mg/dL (ref 8–23)
CO2: 24 mmol/L (ref 22–32)
Calcium: 7.7 mg/dL — ABNORMAL LOW (ref 8.9–10.3)
Chloride: 103 mmol/L (ref 98–111)
Creatinine, Ser: 1.15 mg/dL (ref 0.61–1.24)
GFR, Estimated: >60 mL/min (ref >60)
Glucose, Bld: 192 mg/dL — ABNORMAL HIGH (ref 70–99)
Potassium: 3.3 mmol/L — ABNORMAL LOW (ref 3.5–5.1)
Sodium: 137 mmol/L (ref 135–145)
Total Bilirubin: 0.5 mg/dL (ref 0.3–1.2)
Total Protein: 6.7 g/dL (ref 6.5–8.1)

## 2023-02-14 LAB — PROTIME-INR
INR: 1.1 (ref 0.8–1.2)
Prothrombin Time: 13.7 seconds (ref 11.4–15.2)

## 2023-02-14 LAB — RESP PANEL BY RT-PCR (RSV, FLU A&B, COVID)  RVPGX2
Influenza A by PCR: NEGATIVE
Influenza B by PCR: NEGATIVE
Resp Syncytial Virus by PCR: NEGATIVE
SARS Coronavirus 2 by RT PCR: NEGATIVE

## 2023-02-14 LAB — APTT: aPTT: 30 seconds (ref 24–36)

## 2023-02-14 LAB — BRAIN NATRIURETIC PEPTIDE: B Natriuretic Peptide: 890 pg/mL — ABNORMAL HIGH (ref 0.0–100.0)

## 2023-02-14 LAB — TROPONIN I (HIGH SENSITIVITY): Troponin I (High Sensitivity): 19 ng/L — ABNORMAL HIGH (ref <18)

## 2023-02-14 MED ORDER — APIXABAN 5 MG PO TABS
5.0000 mg | ORAL_TABLET | Freq: Two times a day (BID) | ORAL | Status: DC
  Administered 2023-02-14 – 2023-02-17 (×6): 5 mg via ORAL
  Filled 2023-02-14 (×6): qty 1

## 2023-02-14 MED ORDER — ONDANSETRON HCL 4 MG/2ML IJ SOLN: 4.0000 mg | Freq: Four times a day (QID) | INTRAMUSCULAR | Status: DC | PRN

## 2023-02-14 MED ORDER — IRBESARTAN 75 MG PO TABS
37.5000 mg | ORAL_TABLET | Freq: Every day | ORAL | Status: DC
  Administered 2023-02-15 – 2023-02-16 (×2): 37.5 mg via ORAL
  Filled 2023-02-14 (×2): qty 1

## 2023-02-14 MED ORDER — LORATADINE 10 MG PO TABS
10.0000 mg | ORAL_TABLET | Freq: Every day | ORAL | Status: DC
  Administered 2023-02-15 – 2023-02-17 (×3): 10 mg via ORAL
  Filled 2023-02-14 (×3): qty 1

## 2023-02-14 MED ORDER — ONDANSETRON HCL 4 MG PO TABS: 4.0000 mg | ORAL_TABLET | Freq: Four times a day (QID) | ORAL | Status: DC | PRN

## 2023-02-14 MED ORDER — ATORVASTATIN CALCIUM 40 MG PO TABS
40.0000 mg | ORAL_TABLET | Freq: Every day | ORAL | Status: DC
  Administered 2023-02-14 – 2023-02-16 (×3): 40 mg via ORAL
  Filled 2023-02-14 (×3): qty 1

## 2023-02-14 MED ORDER — SODIUM CHLORIDE 0.9 % IV SOLN
2.0000 g | Freq: Once | INTRAVENOUS | Status: AC
  Administered 2023-02-14: 2 g via INTRAVENOUS
  Filled 2023-02-14: qty 20

## 2023-02-14 MED ORDER — FUROSEMIDE 10 MG/ML IJ SOLN
40.0000 mg | Freq: Two times a day (BID) | INTRAMUSCULAR | Status: DC
  Administered 2023-02-15 – 2023-02-17 (×5): 40 mg via INTRAVENOUS
  Filled 2023-02-14 (×5): qty 4

## 2023-02-14 MED ORDER — ACETAMINOPHEN 650 MG RE SUPP: 650.0000 mg | Freq: Four times a day (QID) | RECTAL | Status: DC | PRN

## 2023-02-14 MED ORDER — DOFETILIDE 500 MCG PO CAPS
500.0000 ug | ORAL_CAPSULE | Freq: Two times a day (BID) | ORAL | Status: DC
  Administered 2023-02-14 – 2023-02-15 (×2): 500 ug via ORAL
  Filled 2023-02-14 (×2): qty 1

## 2023-02-14 MED ORDER — MELATONIN 3 MG PO TABS
3.0000 mg | ORAL_TABLET | Freq: Every evening | ORAL | Status: DC | PRN
  Administered 2023-02-16: 3 mg via ORAL
  Filled 2023-02-14: qty 1

## 2023-02-14 MED ORDER — SPIRONOLACTONE 12.5 MG HALF TABLET
12.5000 mg | ORAL_TABLET | Freq: Every day | ORAL | Status: DC
  Administered 2023-02-15 – 2023-02-17 (×3): 12.5 mg via ORAL
  Filled 2023-02-14 (×3): qty 1

## 2023-02-14 MED ORDER — POTASSIUM CHLORIDE CRYS ER 20 MEQ PO TBCR
40.0000 meq | EXTENDED_RELEASE_TABLET | ORAL | Status: AC
  Administered 2023-02-14 – 2023-02-15 (×2): 40 meq via ORAL
  Filled 2023-02-14 (×2): qty 2

## 2023-02-14 MED ORDER — ALLOPURINOL 300 MG PO TABS
300.0000 mg | ORAL_TABLET | Freq: Every day | ORAL | Status: DC
  Administered 2023-02-15 – 2023-02-17 (×3): 300 mg via ORAL
  Filled 2023-02-14: qty 3
  Filled 2023-02-14 (×2): qty 1

## 2023-02-14 MED ORDER — OXYCODONE HCL 5 MG PO TABS: 5.0000 mg | ORAL_TABLET | Freq: Four times a day (QID) | ORAL | Status: DC | PRN

## 2023-02-14 MED ORDER — SODIUM CHLORIDE 0.9 % IV SOLN
500.0000 mg | INTRAVENOUS | Status: DC
  Administered 2023-02-14: 500 mg via INTRAVENOUS
  Filled 2023-02-14: qty 5

## 2023-02-14 MED ORDER — SODIUM CHLORIDE 0.9 % IV SOLN: 1.0000 g | Freq: Once | INTRAVENOUS | Status: DC

## 2023-02-14 MED ORDER — PANTOPRAZOLE SODIUM 40 MG PO TBEC
40.0000 mg | DELAYED_RELEASE_TABLET | Freq: Every day | ORAL | Status: DC
  Administered 2023-02-15 – 2023-02-17 (×3): 40 mg via ORAL
  Filled 2023-02-14 (×3): qty 1

## 2023-02-14 MED ORDER — FUROSEMIDE 10 MG/ML IJ SOLN
40.0000 mg | INTRAMUSCULAR | Status: AC
  Administered 2023-02-14: 40 mg via INTRAVENOUS
  Filled 2023-02-14: qty 4

## 2023-02-14 MED ORDER — LACTATED RINGERS IV SOLN: INTRAVENOUS | Status: DC

## 2023-02-14 MED ORDER — ACETAMINOPHEN 325 MG PO TABS: 650.0000 mg | ORAL_TABLET | Freq: Four times a day (QID) | ORAL | Status: DC | PRN

## 2023-02-14 NOTE — Sepsis Progress Note (Signed)
Elink following for Sepsis Protocol 

## 2023-02-14 NOTE — Assessment & Plan Note (Signed)
No chest pain.  Continue blood pressure monitoring.

## 2023-02-14 NOTE — Assessment & Plan Note (Addendum)
Patient had recent chemotherapy with medical immunosuppression.  Continue to follow up cell count. Note that his wbc was 20 on 01/19/23

## 2023-02-14 NOTE — Assessment & Plan Note (Signed)
Continue ARB, furosemide and spironolactone.

## 2023-02-14 NOTE — ED Provider Notes (Signed)
[facility] Provider Note   CSN: [id number] Arrival date & time: 02/14/23  1725     History  Chief Complaint  Patient presents with   Shortness of Breath    Alejandro [name] is a 82 y.o. male.   Shortness of Breath    This patient is a 82 year old male, history of high cholesterol, history of hypertension, history of diabetes and a history of atrial fibrillation permanently on Eliquis, he is also on chemotherapy for lymphoma.  The patient had a Port-A-Cath placed in his right chest about a month ago.  He states that ever since about 3 weeks ago he has been having increased amounts of wheezing and shortness of breath.  This coincides with his last dose of chemotherapy.  He reports that the shortness of breath gets worse at times, better at times, it will come on spontaneously without movement or breathing and is not associated with coughing.  He has not noticed any significant swelling of his legs more than usual and has not had any fevers.  His son at the bedside also reports that he is not taking his medications correctly.  He will often take no medicines or only some of his medicines and is unsure which medicines to take and when.  I have reviewed the patient's medication record and it appears that he is supposed to be taking spironolactone but I do not see any other medications for diuresis.  Home Medications Prior to Admission medications   Medication Sig Start Date End Date Taking? Authorizing Provider  acetaminophen (TYLENOL) 500 MG tablet Take 1,000 mg by mouth every 6 (six) hours as needed for moderate pain or mild pain.    [provider]  albuterol (VENTOLIN HFA) 108 (90 Base) MCG/ACT inhaler Inhale 2 puffs into the lungs every 6 (six) hours as needed for wheezing or shortness of breath. 09/22/22   [provider]  allopurinol (ZYLOPRIM) 300 MG tablet Take 1 tablet (300 mg total) by mouth daily. 01/05/23   Derek Jack, MD  apixaban (ELIQUIS) 5 MG TABS tablet Take 1 tablet (5 mg total) by mouth 2 (two) times daily. 01/08/23   Pappayliou, Barnetta Chapel A, DO  atorvastatin (LIPITOR) 40 MG tablet Take 40 mg by mouth at bedtime.    [provider]  BENDAMUSTINE HCL IV Inject into the vein every 28 (twenty-eight) days. 01/07/23   [provider]  cyanocobalamin (VITAMIN B12) 1000 MCG tablet Take 1,000 mcg by mouth daily.    [provider]  cyanocobalamin (VITAMIN B12) 1000 MCG/ML injection Inject 1,000 mcg into the skin every 30 (thirty) days. 10/20/22   [provider]  docusate sodium (COLACE) 100 MG capsule Take 1 capsule (100 mg total) by mouth 2 (two) times daily. Patient taking differently: Take 100 mg by mouth 2 (two) times daily as needed for mild constipation or moderate constipation. 12/15/22 12/15/23  Pappayliou, Barnetta Chapel A, DO  dofetilide (TIKOSYN) 500 MCG capsule Take 500 mcg by mouth 2 (two) times daily.    [provider]  famotidine (PEPCID) 20 MG tablet Take 1 tablet (20 mg total) by mouth 2 (two) times daily. 11/17/22   Collene Gobble, MD  ferrous sulfate 325 (65 FE) MG EC tablet Take 1 tablet (325 mg total) by mouth daily with breakfast. 02/04/23   Tarri Abernethy M, PA-C  fluticasone (FLONASE) 50 MCG/ACT nasal spray Place 1 spray into both nostrils daily. 02/02/23   Harriett Rush, PA-C  glipiZIDE (GLUCOTROL XL) 10 MG 24 hr tablet Take 10 mg by mouth daily.    [provider]  lidocaine-prilocaine (EMLA) cream Apply to affected area once Patient not taking: Reported on 02/04/2023 01/05/23   Derek Jack, MD  loratadine (CLARITIN) 10 MG tablet Take 10 mg by mouth daily. 04/25/22   [provider]  Melatonin 10 MG CAPS Take 30 mg by mouth at bedtime as needed (sleep).    [provider]  metFORMIN (GLUCOPHAGE-XR) 750 MG 24 hr tablet Take 750 mg by mouth 2 (two) times daily with a meal.    [provider]   metFORMIN (GLUCOPHAGE-XR) 750 MG 24 hr tablet Take 1 tablet by mouth 2 (two) times daily with a meal. 01/05/23   [provider]  oxyCODONE (ROXICODONE) 5 MG immediate release tablet Take 1 tablet (5 mg total) by mouth every 6 (six) hours as needed. Patient taking differently: Take 5 mg by mouth every 6 (six) hours as needed for severe pain or moderate pain. 12/15/22   Pappayliou, Barnetta Chapel A, DO  pantoprazole (PROTONIX) 40 MG tablet Take 40 mg by mouth daily. 02/07/20   [provider]  polyethylene glycol (MIRALAX / GLYCOLAX) 17 g packet Take 17 g by mouth daily. Patient taking differently: Take 17 g by mouth daily as needed for moderate constipation. 12/07/22   Godfrey Pick, MD  potassium chloride (MICRO-K) 10 MEQ CR capsule Take 10 mEq by mouth daily. 06/12/21   [provider]  potassium chloride (MICRO-K) 10 MEQ CR capsule Take 1 capsule by mouth daily. 01/05/23   [provider]  Povidone, PF, (IVIZIA DRY EYES) 0.5 % SOLN Place 1 drop into both eyes daily as needed (dry eyes).    [provider]  prochlorperazine (COMPAZINE) 10 MG tablet Take 1 tablet (10 mg total) by mouth every 6 (six) hours as needed for nausea or vomiting. 01/05/23   Derek Jack, MD  riTUXimab (RITUXAN IV) Inject into the vein every 28 (twenty-eight) days. 01/07/23   [provider]  sodium chloride (OCEAN) 0.65 % SOLN nasal spray Place 1 spray into both nostrils as needed for congestion. 01/19/23   Harriett Rush, PA-C  spironolactone (ALDACTONE) 25 MG tablet Take 0.5 tablets (12.5 mg total) by mouth daily. 12/18/22 12/07/24  Furth, Cadence H, PA-C  valsartan (DIOVAN) 40 MG tablet Take 1 tablet (40 mg total) by mouth daily. 01/07/22   Imogene Burn, PA-C      Allergies    Patient has no known allergies.    Review of Systems   Review of Systems  Respiratory:  Positive for shortness of breath.   All other systems reviewed and are negative.   Physical  Exam Updated Vital Signs BP (!) 165/84   Pulse 82   Resp (!) 23   Ht 1.803 m (5\' 11" )   Wt 106.6 kg   SpO2 94%   BMI 32.78 kg/m  Physical Exam Vitals and nursing note reviewed.  Constitutional:      General: He is not in acute distress.    Appearance: He is well-developed.  HENT:     Head: Normocephalic and atraumatic.     Mouth/Throat:     Pharynx: No oropharyngeal exudate.  Eyes:     General: No scleral icterus.       Right eye: No discharge.        Left eye: No discharge.     Conjunctiva/sclera: Conjunctivae normal.     Pupils: Pupils are equal,  round, and reactive to light.  Neck:     Thyroid: No thyromegaly.     Vascular: No JVD.  Cardiovascular:     Rate and Rhythm: Normal rate and regular rhythm.     Heart sounds: Murmur heard.     No friction rub. No gallop.  Pulmonary:     Effort: Pulmonary effort is normal. No respiratory distress.     Breath sounds: Wheezing and rales present.     Comments: Slight expiratory wheeze but speaks in full sentences Abdominal:     General: Bowel sounds are normal. There is no distension.     Palpations: Abdomen is soft. There is no mass.     Tenderness: There is no abdominal tenderness.  Musculoskeletal:        General: No tenderness. Normal range of motion.     Cervical back: Normal range of motion and neck supple.     Right lower leg: Edema present.     Left lower leg: Edema present.     Comments: Symmetrical 2+ pitting edema in the pretibial regions  Lymphadenopathy:     Cervical: No cervical adenopathy.  Skin:    General: Skin is warm and dry.     Findings: No erythema or rash.  Neurological:     Mental Status: He is alert.     Coordination: Coordination normal.  Psychiatric:        Behavior: Behavior normal.     ED Results / Procedures / Treatments   Labs (all labs ordered are listed, but only abnormal results are displayed) Labs Reviewed  CBC WITH DIFFERENTIAL/PLATELET - Abnormal; Notable for the following  components:      Result Value   WBC 25.1 (*)    RBC 3.76 (*)    Hemoglobin 11.4 (*)    HCT 34.4 (*)    Neutro Abs 20.5 (*)    Monocytes Absolute 1.7 (*)    Abs Immature Granulocytes 1.45 (*)    All other components within normal limits  COMPREHENSIVE METABOLIC PANEL - Abnormal; Notable for the following components:   Potassium 3.3 (*)    Glucose, Bld 192 (*)    Calcium 7.7 (*)    Albumin 3.4 (*)    Alkaline Phosphatase 132 (*)    All other components within normal limits  BRAIN NATRIURETIC PEPTIDE - Abnormal; Notable for the following components:   B Natriuretic Peptide 890.0 (*)    All other components within normal limits  TROPONIN I (HIGH SENSITIVITY) - Abnormal; Notable for the following components:   Troponin I (High Sensitivity) 19 (*)    All other components within normal limits  RESP PANEL BY RT-PCR (RSV, FLU A&B, COVID)  RVPGX2  CULTURE, BLOOD (ROUTINE X 2)  CULTURE, BLOOD (ROUTINE X 2)  LACTIC ACID, PLASMA  LACTIC ACID, PLASMA  PROTIME-INR  APTT    EKG EKG Interpretation  Date/Time:  Saturday February 14 2023 18:26:54 EDT Ventricular Rate:  88 PR Interval:  144 QRS Duration: 107 QT Interval:  395 QTC Calculation: 478 R Axis:   98 Text Interpretation: Sinus or ectopic atrial rhythm Right axis deviation Minimal ST depression, lateral leads Borderline prolonged QT interval since last tracing no significant change Confirmed by Noemi Chapel [phone]) on 02/14/2023 6:32:48 PM  Radiology DG Chest Port 1 View  Result Date: 02/14/2023 CLINICAL DATA:  Shortness of breath, chest pain EXAM: PORTABLE CHEST 1 VIEW COMPARISON:  Chest radiograph dated 01/06/2023. FINDINGS: The heart is enlarged. Vascular calcifications are seen in the aortic  arch. There is mild diffuse bilateral interstitial opacities. Trace pleural effusions are difficult to exclude. There is no pneumothorax. A right internal jugular central venous port catheter tip overlies the superior vena cava or  brachiocephalic vein. Degenerative changes are seen in the spine. IMPRESSION: Cardiomegaly with mild diffuse bilateral interstitial opacities, which may reflect pulmonary edema or atypical infection. Electronically Signed   By: Zerita Boers M.D.   On: 02/14/2023 19:03    Procedures .Critical Care  Performed by: Noemi Chapel, MD Authorized by: Noemi Chapel, MD   Critical care provider statement:    Critical care time (minutes):  45   Critical care time was exclusive of:  Separately billable procedures and treating other patients   Critical care was necessary to treat or prevent imminent or life-threatening deterioration of the following conditions:  Cardiac failure and respiratory failure   Critical care was time spent personally by me on the following activities:  Development of treatment plan with patient or surrogate, discussions with consultants, evaluation of patient's response to treatment, examination of patient, obtaining history from patient or surrogate, review of old charts, re-evaluation of patient's condition, pulse oximetry, ordering and review of radiographic studies, ordering and review of laboratory studies and ordering and performing treatments and interventions   Care discussed with: admitting provider   Comments:           Medications Ordered in ED Medications  azithromycin (ZITHROMAX) 500 mg in sodium chloride 0.9 % 250 mL IVPB (has no administration in time range)  lactated ringers infusion (has no administration in time range)  cefTRIAXone (ROCEPHIN) 2 g in sodium chloride 0.9 % 100 mL IVPB (has no administration in time range)  furosemide (LASIX) injection 40 mg (40 mg Intravenous Given 02/14/23 1830)    ED Course/ Medical Decision Making/ A&P Clinical Course as of 02/14/23 2022  Sat Feb 14, 2023  1831 EKG 12-Lead [BM]    Clinical Course User Index [BM] Noemi Chapel, MD                             Medical Decision Making Amount and/or Complexity of Data  Reviewed Labs: ordered. Radiology: ordered. ECG/medicine tests: ordered. Decision-making details documented in ED Course.  Risk Prescription drug management.   This patient presents to the ED for concern of shortness of breath, this involves an extensive number of treatment options, and is a complaint that carries with it a high risk of complications and morbidity.  The differential diagnosis includes pulmonary embolism though this seems less likely given his anticoagulated state.  Would consider congestive heart failure given the bilateral lower extremity edema and rales.  He has orthopneic and having difficulty with ambulating because of shortness of breath.  He is not on a formal diuretic as best we can tell.  Would consider pneumonia but no coughing or fever.  Would consider coronary disease as he has had a bypass graft though he does not have any chest pain.  He is also uncontrolled on his blood pressure at 180/89 at this time.  The son adds that he is supposed to be on oxygen at home but he does not use it   Co morbidities that complicate the patient evaluation  Congestive heart failure, the last ejection fraction was 50 to 55% left ventricular evaluation with no regional wall motion abnormalities, he had decreased right ventricular systolic function.  There was moderate aortic valve stenosis, this is consistent with the patient's  heart murmur Heart failure Coronary disease A-fib Cancer   Additional history obtained:  Additional history obtained from electronic medical record External records from outside source obtained and reviewed including prior echocardiogram   Lab Tests:  I Ordered, and personally interpreted labs.  The pertinent results include: Leukocytosis over 25,000, BNP over 800, troponin of 19   Imaging Studies ordered:  I ordered imaging studies including chest x-ray I independently visualized and interpreted imaging which showed diffuse infiltrates I agree  with the radiologist interpretation   Cardiac Monitoring: / EKG:  The patient was maintained on a cardiac monitor.  I personally viewed and interpreted the cardiac monitored which showed an underlying rhythm of: A-fib   Consultations Obtained:  I requested consultation with the hospitalist Dr. Cathlean Sauer,  and discussed lab and imaging findings as well as pertinent plan - they recommend: Admission to the hospital, higher level of care   Problem List / ED Course / Critical interventions / Medication management  The patient is short of breath, fluid overloaded but also has signs of pneumonia with a significant leukocytosis.  I am concerned that there may be some underlying sepsis given the significant leukocytosis and hypoxia.  There are infiltrates that could be consistent with pneumonia but may also be pulmonary edema.  I discussed the care with the hospitalist, the patient will be treated for both possible pulmonary sepsis as well as CHF.  I do not think IV fluids are needed at this time as the patient is not hypotensive and shows no signs of shock I ordered medication including Lasix and antibiotics  Reevaluation of the patient after these medicines showed that the patient slightly improved I have reviewed the patients home medicines and have made adjustments as needed   Social Determinants of Health:  Congestive heart failure   Test / Admission - Considered:  Will admit to higher level of care         Final Clinical Impression(s) / ED Diagnoses Final diagnoses:  Community acquired pneumonia, unspecified laterality  Acute congestive heart failure, unspecified heart failure type [facility])    Rx / DC Orders ED Discharge Orders     None         Noemi Chapel, MD 02/14/23 2022

## 2023-02-14 NOTE — Assessment & Plan Note (Signed)
Continue with pantoprazole/.  

## 2023-02-14 NOTE — Assessment & Plan Note (Signed)
Calculated BMI is 32,7 consistent with obesity class 1.

## 2023-02-14 NOTE — ED Triage Notes (Signed)
Patient c/o shortness of breath that started today suddenly while sitting on couch. Patient denies any chest pain. Per patient had a similar episode this morning that woke him at 3am. Patient states episode did not last long, unable to give estimated time but states he went to cough and went back to sleep in which he slept till after 7 sitting up. Patient reports and runny nose and cough without fever. Patient already reporting improvement in shortness of breath during triage. Patient has oxygen at need at home and family put him on 4 liters via [facility] which was reported to help. Patient's O2 sat currently 96% on RA. Patient is a chemo patient, second and last dose was on 02/03/23.

## 2023-02-14 NOTE — Assessment & Plan Note (Signed)
Echocardiogram from 08/2022 with preserved LV systolic function EF 50 to 55%, mild dilated internal cavity, right LV systolic function preserved. LA with moderate dilatation, moderate aortic stenosis.  Plan to continue diuresis with furosemide 40 mg IV q12 hrs Continue with ARB and spironolactone. Continue blood pressure monitoring. Check echocardiogram,   Acute hypoxemic respirator failure due to cardiogenic pulmonary edema, considering leukocytosis need to rule out pneumonia. Patient had antibiotic therapy in the ED Will order repeat chest radiograph in am, if no clearing infiltrates may need non contrast CT chest and resume antibiotics. Follow up cell count in am.

## 2023-02-14 NOTE — Assessment & Plan Note (Signed)
Hypokalemia  Add 80 meq Kcl today in 2 divided doses. Continue diuresis and follow up renal function and electrolytes in am. Keep K at 4 and Mg at 2.

## 2023-02-14 NOTE — Assessment & Plan Note (Addendum)
Continue telemetry monitoring. Currently is rate and  rhythm controlled with dofetilide.  Continue anticoagulation with apixaban.

## 2023-02-14 NOTE — Assessment & Plan Note (Signed)
Continue with statin therapy.  ?

## 2023-02-14 NOTE — H&P (Addendum)
History and Physical    Patient: Alejandro [name] [id number] DOB: [birth year] DOA: 02/14/2023 DOS: the patient was seen and examined on 02/14/2023 PCP: Pablo Lawrence, NP  Patient coming from: Home  Chief Complaint:  Chief Complaint  Patient presents with   Shortness of Breath   HPI: [name] is a 82 y.o. male with medical history significant of atrial fibrillation, aortic stenosis, coronary artery disease, CKD, heart failure, hypertension, obesity and stage 3 high grade follicular lymphoma.  Patient has been at his usual state of health until this morning at 3:00 when he woke up with severe dyspnea, no chest pain or cough. Through the course of the day he had persistent dyspnea and decided to come to the ED for further evaluation. Reports positive lower extremity edema for the last few days.   He had his first chemotherapy on 02/03/22 with apparent good toleration. No recent change in his medications.    Review of Systems: As mentioned in the history of present illness. All other systems reviewed and are negative. Past Medical History:  Diagnosis Date   A-fib [facility])    AAA Ultrasound 01/11/2020    AAA [place] 01/11/20:  no abdominal aortic aneurysm, 2.4 cm   Aortic insufficiency    a. mild-mod by intraop TEE 05/2018 (no aortic stenosis) // Echo 2/21: mild //  Echo 9/22: trivial   Aortic stenosis    Echo 9/22: mild (mean 16) // Echo 2/21: mild (mean 18.7)   CAD (coronary artery disease)    a. NSTEMI 05/2018 with multivessel disease -> s/p CABGx4 05/11/18 // Myoview 06/07/20: inf scar w mild peri-infarct ischemia, EF 37; Int Risk   Cancer (HCC)    Carotid artery disease ([place])    PreCABG [place] 05/07/18: Bilat ICA 1-39   CKD (chronic kidney disease), stage III ([place])    HFimpEF (heart failure with improved EF)    Echocardiogram 01/11/20: EF 45, Gr 3 DD, mild AI, mild AS (mean 18.7 mmHg) // Echocardiogram 08/14/21:  EF 55-60, inf HK, mild LVH, Gr 1 DD, normal RVSF, RVSP 25.3, mild LAE, trivial  MR, MAC, trivial AI, mild AS (mean 16 mmHg, Vmax 283 cm/s, DI 0.41)   History of nephrolithiasis 2004   Hyperlipidemia    Hypertension    IBS (irritable colon syndrome)    Ischemic cardiomyopathy    Mitral regurgitation    Echo 9/22: trivial MR   Postoperative atrial fibrillation (HCC)    PVC's (premature ventricular contractions)    Reflux    Type 2 diabetes mellitus ([place])    Past Surgical History:  Procedure Laterality Date   BRONCHIAL NEEDLE ASPIRATION BIOPSY  11/17/2022   Procedure: BRONCHIAL NEEDLE ASPIRATION BIOPSIES;  Surgeon: Collene Gobble, MD;  Location: [place];  Service: Cardiopulmonary;;   CARDIOVERSION     CORONARY ARTERY BYPASS GRAFT N/A 05/11/2018   Procedure: CORONARY ARTERY BYPASS GRAFTING (CABG), on pump, times four, using left internal mammary artery and endoscopically harvested right greater saphenous leg vein.;  Surgeon: Ivin Poot, MD;  Location: [place];  Service: Open Heart Surgery;  Laterality: N/A;   ESOPHAGOGASTRODUODENOSCOPY  02/04/2012   Procedure: ESOPHAGOGASTRODUODENOSCOPY (EGD);  Surgeon: Rogene Houston, MD;  [facility];  Service: Endoscopy;  Laterality: N/A;  100   INGUINAL LYMPH NODE BIOPSY Left 12/15/2022   Procedure: INGUINAL LYMPH NODE BIOPSY;  Surgeon: Rusty Aus, DO;  Location: [facility];  Service: General;  Laterality: Left;   LEFT HEART CATH AND CORONARY ANGIOGRAPHY N/A 05/04/2018  Procedure: LEFT HEART CATH AND CORONARY ANGIOGRAPHY;  Surgeon: Belva Crome, MD;  Location: [place] [facility];  Service: Cardiovascular;  Laterality: N/A;   NASAL FRACTURE SURGERY     PORTACATH PLACEMENT Right 01/06/2023   Procedure: INSERTION PORT-A-CATH;  Surgeon: Rusty Aus, DO;  Location: [facility];  Service: General;  Laterality: Right;   TEE WITHOUT CARDIOVERSION N/A 05/11/2018   Procedure: TRANSESOPHAGEAL ECHOCARDIOGRAM (TEE);  Surgeon: Prescott Gum, Collier Salina, MD;  Location: [place];  Service: Open Heart Surgery;   Laterality: N/A;   VIDEO BRONCHOSCOPY WITH ENDOBRONCHIAL ULTRASOUND Right 11/17/2022   Procedure: VIDEO BRONCHOSCOPY WITH ENDOBRONCHIAL ULTRASOUND;  Surgeon: Collene Gobble, MD;  Location: [place];  Service: Cardiopulmonary;  Laterality: Right;   Social History:  reports that he has never smoked. He has never used smokeless tobacco. He reports that he does not drink alcohol and does not use drugs.  No Known Allergies  Family History  Problem Relation Age of Onset   Diabetes Mellitus II Mother    Heart attack Father    Heart attack Brother    Heart disease Other    Arthritis Other    Asthma Other    Diabetes Other     Prior to Admission medications   Medication Sig Start Date End Date Taking? Authorizing Provider  acetaminophen (TYLENOL) 500 MG tablet Take 1,000 mg by mouth every 6 (six) hours as needed for moderate pain or mild pain.    [provider]  albuterol (VENTOLIN HFA) 108 (90 Base) MCG/ACT inhaler Inhale 2 puffs into the lungs every 6 (six) hours as needed for wheezing or shortness of breath. 09/22/22   [provider]  allopurinol (ZYLOPRIM) 300 MG tablet Take 1 tablet (300 mg total) by mouth daily. 01/05/23   Derek Jack, MD  apixaban (ELIQUIS) 5 MG TABS tablet Take 1 tablet (5 mg total) by mouth 2 (two) times daily. 01/08/23   Pappayliou, Barnetta Chapel A, DO  atorvastatin (LIPITOR) 40 MG tablet Take 40 mg by mouth at bedtime.    [provider]  BENDAMUSTINE HCL IV Inject into the vein every 28 (twenty-eight) days. 01/07/23   [provider]  cyanocobalamin (VITAMIN B12) 1000 MCG tablet Take 1,000 mcg by mouth daily.    [provider]  cyanocobalamin (VITAMIN B12) 1000 MCG/ML injection Inject 1,000 mcg into the skin every 30 (thirty) days. 10/20/22   [provider]  docusate sodium (COLACE) 100 MG capsule Take 1 capsule (100 mg total) by mouth 2 (two) times daily. Patient taking differently: Take 100 mg by mouth 2  (two) times daily as needed for mild constipation or moderate constipation. 12/15/22 12/15/23  Pappayliou, Barnetta Chapel A, DO  dofetilide (TIKOSYN) 500 MCG capsule Take 500 mcg by mouth 2 (two) times daily.    [provider]  famotidine (PEPCID) 20 MG tablet Take 1 tablet (20 mg total) by mouth 2 (two) times daily. 11/17/22   Collene Gobble, MD  ferrous sulfate 325 (65 FE) MG EC tablet Take 1 tablet (325 mg total) by mouth daily with breakfast. 02/04/23   Tarri Abernethy M, PA-C  fluticasone (FLONASE) 50 MCG/ACT nasal spray Place 1 spray into both nostrils daily. 02/02/23   Harriett Rush, PA-C  glipiZIDE (GLUCOTROL XL) 10 MG 24 hr tablet Take 10 mg by mouth daily.    [provider]  lidocaine-prilocaine (EMLA) cream Apply to affected area once Patient not taking: Reported on 02/04/2023 01/05/23   Derek Jack, MD  loratadine (CLARITIN) 10  MG tablet Take 10 mg by mouth daily. 04/25/22   [provider]  Melatonin 10 MG CAPS Take 30 mg by mouth at bedtime as needed (sleep).    [provider]  metFORMIN (GLUCOPHAGE-XR) 750 MG 24 hr tablet Take 750 mg by mouth 2 (two) times daily with a meal.    [provider]  metFORMIN (GLUCOPHAGE-XR) 750 MG 24 hr tablet Take 1 tablet by mouth 2 (two) times daily with a meal. 01/05/23   [provider]  oxyCODONE (ROXICODONE) 5 MG immediate release tablet Take 1 tablet (5 mg total) by mouth every 6 (six) hours as needed. Patient taking differently: Take 5 mg by mouth every 6 (six) hours as needed for severe pain or moderate pain. 12/15/22   Pappayliou, Barnetta Chapel A, DO  pantoprazole (PROTONIX) 40 MG tablet Take 40 mg by mouth daily. 02/07/20   [provider]  polyethylene glycol (MIRALAX / GLYCOLAX) 17 g packet Take 17 g by mouth daily. Patient taking differently: Take 17 g by mouth daily as needed for moderate constipation. 12/07/22   Godfrey Pick, MD  potassium chloride (MICRO-K) 10 MEQ CR capsule  Take 10 mEq by mouth daily. 06/12/21   [provider]  potassium chloride (MICRO-K) 10 MEQ CR capsule Take 1 capsule by mouth daily. 01/05/23   [provider]  Povidone, PF, (IVIZIA DRY EYES) 0.5 % SOLN Place 1 drop into both eyes daily as needed (dry eyes).    [provider]  prochlorperazine (COMPAZINE) 10 MG tablet Take 1 tablet (10 mg total) by mouth every 6 (six) hours as needed for nausea or vomiting. 01/05/23   Derek Jack, MD  riTUXimab (RITUXAN IV) Inject into the vein every 28 (twenty-eight) days. 01/07/23   [provider]  sodium chloride (OCEAN) 0.65 % SOLN nasal spray Place 1 spray into both nostrils as needed for congestion. 01/19/23   Harriett Rush, PA-C  spironolactone (ALDACTONE) 25 MG tablet Take 0.5 tablets (12.5 mg total) by mouth daily. 12/18/22 12/07/24  Furth, Cadence H, PA-C  valsartan (DIOVAN) 40 MG tablet Take 1 tablet (40 mg total) by mouth daily. 01/07/22   Imogene Burn, PA-C    Physical Exam: Vitals:   02/14/23 1746 02/14/23 1800 02/14/23 1801 02/14/23 2047  BP:  (!) 165/84    Pulse: 81 82    Resp: (!) 31 (!) 23    Temp:    98.8 F (37.1 C)  TempSrc:    Oral  SpO2: 92% 94%    Weight:   106.6 kg   Height:   5\' 11"  (1.803 m)    Neurology awake and alert ENT with mild pallor with no icterus. Cardiovascular with S1 and S2 present, regular with no gallops or rubs, positive murmur systolic at the base and radiated to the carotids. Mild JVD Positive lower extremity edema ++ Respiratory with rales bilaterally with no rhonchi or wheezing Abdomen with no distention No rashes  Data Reviewed:   Na 137, K 3,3 Cl 103 bicarbonate 24, glucose 192 bun 11 cr 1,15  BNP 890 High sensitive troponin 19  Lactic acid 2,1  Wbc 25, hgb 11,4 plt 146  Sars covid 19 negative   Chest radiograph with cardiomegaly with bilateral hilar congestion and bilateral cephalization of the vasculature, positive bilateral symmetric  interstitial infiltrates.  Sternotomy wires in place. Port  a cath right IJ.   EKG 85 bpm, normal axis, normal intervals, sinus rhythm with positive PVC, poor R  R wave  progression, with no significant ST segment or T wave changes.   82 yo male with past medical history of heart failure, chronic atrial fibrillation and hypertension with recent administration of chemotherapy for lymphoma who presents with acute onset of dyspnea. On his physical examination he is volume overloaded. Lactic acid and wbc are elevated. Chest radiograph with signs of pulmonary edema.   Dx acute on chronic diastolic heart failure, to rule out pneumonia.   Assessment and Plan: * Acute on chronic diastolic CHF (congestive heart failure) (HCC) Echocardiogram from 08/2022 with preserved LV systolic function EF 50 to 55%, mild dilated internal cavity, right LV systolic function preserved. LA with moderate dilatation, moderate aortic stenosis.  Plan to continue diuresis with furosemide 40 mg IV q12 hrs Continue with ARB and spironolactone. Continue blood pressure monitoring. Check echocardiogram,   Acute hypoxemic respirator failure due to cardiogenic pulmonary edema, considering leukocytosis need to rule out pneumonia. Patient had antibiotic therapy in the ED Will order repeat chest radiograph in am, if no clearing infiltrates may need non contrast CT chest and resume antibiotics. Follow up cell count in am.   Essential hypertension Continue ARB, furosemide and spironolactone.   Paroxysmal atrial fibrillation (HCC) Continue telemetry monitoring. Currently is rate and  rhythm controlled with dofetilide.  Continue anticoagulation with apixaban.   CKD (chronic kidney disease), stage III (HCC) Hypokalemia  Add 80 meq Kcl today in 2 divided doses. Continue diuresis and follow up renal function and electrolytes in am. Keep K at 4 and Mg at 2.   CAD (coronary artery disease) No chest pain.  Continue blood  pressure monitoring.   Diabetes mellitus ([place]) Continue glucose cover and monitoring with insulin sliding scale.  His random admission glucose is 192 mg/dl.   Hyperlipidemia Continue with statin therapy.   GERD (gastroesophageal reflux disease) Continue with pantoprazole.   Follicular lymphoma grade 3a ([place]) Patient had recent chemotherapy with medical immunosuppression.  Continue to follow up cell count. Note that his wbc was 20 on 01/19/23  Obesity Calculated BMI is 32,7 consistent with obesity class 1.     Advance Care Planning:   Code Status: Full Code   Consults: none   Family Communication: no family at the bedside   Severity of Illness: The appropriate patient status for this patient is INPATIENT. Inpatient status is judged to be reasonable and necessary in order to provide the required intensity of service to ensure the patient's safety. The patient's presenting symptoms, physical exam findings, and initial radiographic and laboratory data in the context of their chronic comorbidities is felt to place them at high risk for further clinical deterioration. Furthermore, it is not anticipated that the patient will be medically stable for discharge from the hospital within 2 midnights of admission.   * I certify that at the point of admission it is my clinical judgment that the patient will require inpatient hospital care spanning beyond 2 midnights from the point of admission due to high intensity of service, high risk for further deterioration and high frequency of surveillance required.*  Author: Tawni Millers, MD 02/14/2023 9:02 PM  For on call review [email or web address].

## 2023-02-14 NOTE — Assessment & Plan Note (Addendum)
Continue glucose cover and monitoring with insulin sliding scale.  His random admission glucose is 192 mg/dl.

## 2023-02-15 ENCOUNTER — Other Ambulatory Visit (HOSPITAL_COMMUNITY): Payer: Self-pay | Admitting: *Deleted

## 2023-02-15 ENCOUNTER — Inpatient Hospital Stay (HOSPITAL_COMMUNITY): Payer: Medicare Other

## 2023-02-15 ENCOUNTER — Telehealth: Payer: Self-pay | Admitting: Physician Assistant

## 2023-02-15 DIAGNOSIS — R0609 Other forms of dyspnea: Secondary | ICD-10-CM | POA: Diagnosis not present

## 2023-02-15 DIAGNOSIS — I5033 Acute on chronic diastolic (congestive) heart failure: Secondary | ICD-10-CM | POA: Diagnosis not present

## 2023-02-15 LAB — MAGNESIUM
Magnesium: 1.5 mg/dL — ABNORMAL LOW (ref 1.7–2.4)
Magnesium: 2.2 mg/dL (ref 1.7–2.4)

## 2023-02-15 LAB — CBC
HCT: 35.7 % — ABNORMAL LOW (ref 39.0–52.0)
Hemoglobin: 11.7 g/dL — ABNORMAL LOW (ref 13.0–17.0)
MCH: 29.9 pg (ref 26.0–34.0)
MCHC: 32.8 g/dL (ref 30.0–36.0)
MCV: 91.3 fL (ref 80.0–100.0)
Platelets: 171 10*3/uL (ref 150–400)
RBC: 3.91 MIL/uL — ABNORMAL LOW (ref 4.22–5.81)
RDW: 15.4 % (ref 11.5–15.5)
WBC: 22.9 10*3/uL — ABNORMAL HIGH (ref 4.0–10.5)
nRBC: 0 % (ref 0.0–0.2)

## 2023-02-15 LAB — BASIC METABOLIC PANEL
Anion gap: 10 (ref 5–15)
Anion gap: 12 (ref 5–15)
BUN: 11 mg/dL (ref 8–23)
BUN: 12 mg/dL (ref 8–23)
CO2: 26 mmol/L (ref 22–32)
CO2: 26 mmol/L (ref 22–32)
Calcium: 8.3 mg/dL — ABNORMAL LOW (ref 8.9–10.3)
Calcium: 8.4 mg/dL — ABNORMAL LOW (ref 8.9–10.3)
Chloride: 105 mmol/L (ref 98–111)
Chloride: 100 mmol/L (ref 98–111)
Creatinine, Ser: 1.05 mg/dL (ref 0.61–1.24)
Creatinine, Ser: 1.06 mg/dL (ref 0.61–1.24)
GFR, Estimated: >60 mL/min (ref >60)
GFR, Estimated: >60 mL/min (ref >60)
Glucose, Bld: 161 mg/dL — ABNORMAL HIGH (ref 70–99)
Glucose, Bld: 265 mg/dL — ABNORMAL HIGH (ref 70–99)
Potassium: 3.7 mmol/L (ref 3.5–5.1)
Potassium: 3.7 mmol/L (ref 3.5–5.1)
Sodium: 141 mmol/L (ref 135–145)
Sodium: 138 mmol/L (ref 135–145)

## 2023-02-15 LAB — ECHOCARDIOGRAM COMPLETE
AR max vel: 1.69 cm2
AV Area VTI: 1.91 cm2
AV Area mean vel: 1.69 cm2
AV Mean grad: 19 mmHg
AV Peak grad: 35.3 mmHg
Ao pk vel: 2.97 m/s
Area-P 1/2: 5.27 cm2
Height: 71 in
S' Lateral: 4.7 cm
Weight: 3760 oz

## 2023-02-15 LAB — MRSA NEXT GEN BY PCR, NASAL: MRSA by PCR Next Gen: NOT DETECTED

## 2023-02-15 LAB — GLUCOSE, CAPILLARY
Glucose-Capillary: 268 mg/dL — ABNORMAL HIGH (ref 70–99)
Glucose-Capillary: 220 mg/dL — ABNORMAL HIGH (ref 70–99)
Glucose-Capillary: 163 mg/dL — ABNORMAL HIGH (ref 70–99)
Glucose-Capillary: 179 mg/dL — ABNORMAL HIGH (ref 70–99)

## 2023-02-15 LAB — PROCALCITONIN: Procalcitonin: 0.11 ng/mL

## 2023-02-15 MED ORDER — INSULIN ASPART 100 UNIT/ML IJ SOLN
0.0000 [IU] | Freq: Three times a day (TID) | INTRAMUSCULAR | Status: DC
  Administered 2023-02-15: 2 [IU] via SUBCUTANEOUS
  Administered 2023-02-15 – 2023-02-16 (×2): 1 [IU] via SUBCUTANEOUS
  Administered 2023-02-16: 3 [IU] via SUBCUTANEOUS
  Administered 2023-02-16: 1 [IU] via SUBCUTANEOUS
  Administered 2023-02-17: 3 [IU] via SUBCUTANEOUS
  Administered 2023-02-17: 1 [IU] via SUBCUTANEOUS

## 2023-02-15 MED ORDER — HYDRALAZINE HCL 20 MG/ML IJ SOLN
5.0000 mg | INTRAMUSCULAR | Status: DC | PRN
  Administered 2023-02-15: 5 mg via INTRAVENOUS
  Filled 2023-02-15: qty 1

## 2023-02-15 MED ORDER — POTASSIUM CHLORIDE CRYS ER 20 MEQ PO TBCR
30.0000 meq | EXTENDED_RELEASE_TABLET | Freq: Once | ORAL | Status: AC
  Administered 2023-02-15: 30 meq via ORAL
  Filled 2023-02-15: qty 1

## 2023-02-15 MED ORDER — HYDRALAZINE HCL 25 MG PO TABS
25.0000 mg | ORAL_TABLET | Freq: Three times a day (TID) | ORAL | Status: DC | PRN
  Administered 2023-02-15: 25 mg via ORAL
  Filled 2023-02-15: qty 1

## 2023-02-15 MED ORDER — INSULIN ASPART 100 UNIT/ML IJ SOLN: 0.0000 [IU] | Freq: Every day | INTRAMUSCULAR | Status: DC

## 2023-02-15 MED ORDER — CHLORHEXIDINE GLUCONATE CLOTH 2 % EX PADS
6.0000 | MEDICATED_PAD | Freq: Every day | CUTANEOUS | Status: DC
  Administered 2023-02-15 – 2023-02-17 (×3): 6 via TOPICAL

## 2023-02-15 MED ORDER — MAGNESIUM SULFATE 2 GM/50ML IV SOLN
2.0000 g | Freq: Once | INTRAVENOUS | Status: AC
  Administered 2023-02-15: 2 g via INTRAVENOUS
  Filled 2023-02-15: qty 50

## 2023-02-15 NOTE — Progress Notes (Signed)
Telemetry called stating patient was in slow run of torsades echo at bedside states they witnessed what they thought was seizure like activity. 1015 telemetry states long run of vfib and Afib HR 170's /180's. 1017 Rapid response called see notes from telemetry. MD to bedside. See flowsheet for vitals. EKG obtained. Patient alert was able to respond to questions appropriately. Patients HR currently sinus 84 and appears to be in no distress at this present time.

## 2023-02-15 NOTE — Progress Notes (Signed)
I have launched the following phone note to EP scheduler Gracy Bruins and [place] triage team, wanted to copy/paste here so easily viewable by hospital team.  See Dr. Delphina Cahill note on this patient from today - she is [facility] DOD and discussed case with Dr. [place] Patient had torsades and Tikosyn had to be stopped.   Patient was put on our [place] rounding list for consult in AM. Dr. Margaretann Loveless also suggests going ahead and arranging close outpatient EP or afib clinic follow-up within 5-7 days of hospital dismissal. Discharge date TBD as of today. I did send staff msg to Ermalinda Barrios and Dr. Dellia Cloud as well to let them know of the consult request.   Will route this msg to EP scheduler Gracy Bruins and [place] triage to review in case there is any delay with EP scheduler accessing this message -> if EP f/u not available in this time frame, please request afib clinic follow-up. Thank you!

## 2023-02-15 NOTE — Telephone Encounter (Signed)
See Dr. Delphina Cahill note on this patient from today - she is [facility] DOD and discussed case with Dr. [place] Patient had torsades and Tikosyn had to be stopped.  Patient was put on our [place] rounding list for consult in AM. Dr. Margaretann Loveless also suggests going ahead and arranging close outpatient EP or afib clinic follow-up within 5-7 days of hospital dismissal. Discharge date TBD as of today. I did send staff msg to Ermalinda Barrios and Dr. Dellia Cloud as well to let them know of the consult request.  Will route this msg to EP scheduler Gracy Bruins and [place] triage to review in case there is any delay with EP scheduler accessing this message -> if EP f/u not available in this time frame, please request afib clinic follow-up. Thank you!

## 2023-02-15 NOTE — Progress Notes (Signed)
*  PRELIMINARY RESULTS* Echocardiogram 2D Echocardiogram has been performed.  Alejandro [name] 02/15/2023, 11:33 AM

## 2023-02-15 NOTE — Progress Notes (Signed)
Called by Dr. [place] re: 2 brief episodes of torsades in setting tikosyn use and hypomagnesemia. Chart reviewed, patient not seen.  Reviewed briefly with EP, consider stopping Tikosyn, and keep K>4, Mg >2. Check electrolytes at least daily while diuresing. Pt currently in sinus rhythm, did have brief afib after torsade episode.   Discussed that if patient appears clinically unwell, would recommend medicine to medicine service transfer and cardiology will consult upon arrival to [facility]. Otherwise, can be monitored at [facility] with the above management considerations, and cardiology will consult in the morning. Dr. [place] will monitor the patient closely, and should feel free to call with any additional cardiovascular concerns. Per primary, requires a few more days in hospital for diuresis.  We will arrange close outpatient EP or afib clinic follow up within 5-7 days of hospital dismissal.   Elouise Munroe, MD

## 2023-02-15 NOTE — Progress Notes (Addendum)
PROGRESS NOTE    [name]  [id number] DOB: 13-Dec-1940 DOA: 02/14/2023 PCP: Pablo Lawrence, NP    Brief Narrative:   Alejandro [name] is a 82 y.o. male with past medical history significant for paroxysmal atrial fibrillation, aortic stenosis, CAD, CKD stage IIIb, chronic diastolic congestive heart failure, HTN, obesity, stage III high-grade follicular lymphoma who presented to [facility] ED on 3/16 with complaints of shortness of breath.  Additionally he reports increasing lower extremity edema over the last few days.  Denies chest pain, no cough, no fever/chills.  Patient with recent chemotherapy treatment on 02/04/2023 with good toleration.  In the ED, temperature 98.8 F, HR 97, RR 31, BP 180/89, SpO2 92% on room air.  WBC 25.1, hemoglobin 11.4, platelets 156.  Sodium 137, potassium 3.3, chloride 103, CO2 24, glucose 192, BUN 11, creatinine 1.15.  AST 25, ALT 24, total bilirubin 0.5.  BNP 890.0.  High sensitive troponin 19.  Procalcitonin 0.11.  COVID/RSV/influenza PCR negative.  Chest x-ray with cardiomegaly with mild diffuse bilateral interstitial opacities consistent with pulmonary edema.  EDP started azithromycin, ceftriaxone, given 1 dose of Lasix 40 mg IV.  TRH consulted for admission for further evaluation management for concern of acute on chronic diastolic congestive heart failure exacerbation.  Assessment & Plan:    Acute on chronic diastolic congestive heart failure exacerbation Patient presenting with progressive shortness of breath associated with worsening lower extremity edema.  Patient is afebrile, elevated WBC count in the setting of follicular lymphoma.  Procalcitonin 0.11, not indicative of an active infectious process.  BNP elevated 890.0 with chest x-ray with cardiomegaly and mild diffuse bilateral interstitial opacities consistent with pulmonary edema. -- net negative  3.4L since admission -- weight 106.6kg>pending today --TTE: Pending -- Lasix 40 mg IV  q12h -- Spironolactone 12.5 mg p.o. daily -- Strict I's and O's and daily weights -- Monitor on telemetry -- BMP daily while on IV diuresis  Torsades  While having echocardiogram performed on 3/17, patient was noted to go into torsades at 1009am that was nonsustained followed by additional episode at 1030 am lasting roughly 1 minute that self resolved.  Patient without complaints other than feeling "warm".  Discussed with cardiology at [facility], Dr Margaretann Loveless who also discussed with EP Dr.Meallor; with recommendation of discontinuing Tikosyn. -- Will transfer to stepdown unit -- Maintain K > 4.0 and Mag >2.0 -- Continue close monitoring on telemetry -- Plan cardiology consult in the a.m., if has recurrent episode will attempt transfer to [facility] for cardiology evaluation although patient currently not on board with transfer.  Paroxysmal atrial fibrillation Essential hypertension -- Tikosyn discontinued as above -- Irbesartan 37.5 mg p.o. daily -- Spironolactone 12.5 mg p.o. daily -- Lasix as above  Type 2 diabetes mellitus On metformin and glipizide outpatient. -- Check hemoglobin A1c -- Sensitive SSI for coverage -- CBGs before every meal/at bedtime  Stage III high-grade follicular lymphoma Follows with medical oncology, Dr. Delton Coombes outpatient.  Most recent chemotherapy 02/04/2023, tolerating. --Continue allopurinol 300 mg p.o. daily --Continue outpatient follow-up with oncology  HLD -- Atorvastatin 40 mg p.o. daily  Aortic stenosis, moderate TTE 09/09/2022 with moderate aortic valve stenosis with a valve area 1.48 cm. -- Updated TTE: Pending -- Outpatient follow ip with cardiology  Obesity Body mass index is 32.78 kg/m.     DVT prophylaxis: SCDs Start: 02/14/23 2153 apixaban (ELIQUIS) tablet 5 mg    Code Status: Full Code Family Communication: No family present at bedside this morning  Disposition Plan:  Level of care: Telemetry Status is: Inpatient Remains  inpatient appropriate because: IV diuresis    Consultants:  None  Procedures:  TTE: pending  Antimicrobials:  Azithromycin 3/16 - 3/16 Ceftriaxone 3/16 - 3/16   Subjective: Patient seen examined bedside, resting comfortably.  Sitting in bedside chair.  Reports dyspnea much improved.  Continues with good urine output with IV Lasix.  Continues with lower extremity edema, discussed with patient to maintain elevation of legs while lying or sitting.  No other specific questions or concerns at this time.  Denies headache, no dizziness, no chest pain, palpitations, no abdominal pain, no fever/chills/night sweats, no nausea/vomiting/diarrhea, no focal weakness, no cough/congestion, no paresthesias.  No acute events overnight per nursing staff.  Objective: Vitals:   02/14/23 2153 02/15/23 0045 02/15/23 0457 02/15/23 0931  BP:  (!) 162/50 (!) 171/59 (!) 152/108  Pulse:  (!) 58 67 67  Resp:  18 20 20   Temp:  98.1 F (36.7 C) 98 F (36.7 C) 98 F (36.7 C)  TempSrc:  Oral Oral Oral  SpO2: 98% 95% 97% 97%  Weight:      Height:        Intake/Output Summary (Last 24 hours) at 02/15/2023 0952 Last data filed at 02/15/2023 0500 Gross per 24 hour  Intake 100 ml  Output 3450 ml  Net -3350 ml   Filed Weights   02/14/23 1801  Weight: 106.6 kg    Examination:  Physical Exam: GEN: NAD, alert and oriented x 3, obese, chronically ill in appearance HEENT: NCAT, PERRL, EOMI, sclera clear, MMM, poor dentition PULM: Diminished breath sounds bilateral bases with mild crackles, no wheezing, normal [place] effort without accessory muscle use, on room air with SpO2 97% at rest. CV: RRR w/o M/G/R GI: abd soft, NTND, NABS, no R/G/M MSK: 2+ peripheral edema bilateral lower extremities up to knee, muscle strength globally intact 5/5 bilateral upper/lower extremities NEURO: CN II-XII intact, no focal deficits, sensation to light touch intact PSYCH: normal mood/affect Integumentary: No concerning  rashes/lesions/wounds noted on exposed skin surfaces    Data Reviewed: I have personally reviewed following labs and imaging studies  CBC: Recent Labs  Lab 02/14/23 1835 02/15/23 0440  WBC 25.1* 22.9*  NEUTROABS 20.5*  --   HGB 11.4* 11.7*  HCT 34.4* 35.7*  MCV 91.5 91.3  PLT 156 [id number]   Basic Metabolic Panel: Recent Labs  Lab 02/14/23 1835 02/15/23 0440  NA 137 141  K 3.3* 3.7  CL 103 105  CO2 24 26  GLUCOSE 192* 161*  BUN 11 11  CREATININE 1.15 1.05  CALCIUM 7.7* 8.3*  MG  --  1.5*   GFR: Estimated Creatinine Clearance: 70.8 mL/min (by C-G formula based on SCr of 1.05 mg/dL). Liver Function Tests: Recent Labs  Lab 02/14/23 1835  AST 25  ALT 24  ALKPHOS 132*  BILITOT 0.5  PROT 6.7  ALBUMIN 3.4*   No results for input(s): "LIPASE", "AMYLASE" in the last 168 hours. No results for input(s): "AMMONIA" in the last 168 hours. Coagulation Profile: Recent Labs  Lab 02/14/23 2015  INR 1.1   Cardiac Enzymes: No results for input(s): "CKTOTAL", "CKMB", "CKMBINDEX", "TROPONINI" in the last 168 hours. BNP (last 3 results) No results for input(s): "PROBNP" in the last 8760 hours. HbA1C: No results for input(s): "HGBA1C" in the last 72 hours. CBG: No results for input(s): "GLUCAP" in the last 168 hours. Lipid Profile: No results for input(s): "CHOL", "HDL", "LDLCALC", "TRIG", "CHOLHDL", "LDLDIRECT" in the  last 72 hours. Thyroid Function Tests: No results for input(s): "TSH", "T4TOTAL", "FREET4", "T3FREE", "THYROIDAB" in the last 72 hours. Anemia Panel: No results for input(s): "VITAMINB12", "FOLATE", "FERRITIN", "TIBC", "IRON", "RETICCTPCT" in the last 72 hours. Sepsis Labs: Recent Labs  Lab 02/14/23 2015 02/14/23 2221 02/15/23 0440  PROCALCITON  --   --  0.11  LATICACIDVEN 2.1* 1.6  --     Recent Results (from the past 240 hour(s))  Resp panel by RT-PCR (RSV, Flu A&B, Covid) Anterior Nasal Swab     Status: None   Collection Time: 02/14/23  8:05 PM    Specimen: Anterior Nasal Swab  Result Value Ref Range Status   SARS Coronavirus 2 by RT PCR NEGATIVE NEGATIVE Final    Comment: (NOTE) SARS-CoV-2 target nucleic acids are NOT DETECTED.  The SARS-CoV-2 RNA is generally detectable in upper respiratory specimens during the acute phase of infection. The lowest concentration of SARS-CoV-2 viral copies this assay can detect is 138 copies/mL. A negative result does not preclude SARS-Cov-2 infection and should not be used as the sole basis for treatment or other patient management decisions. A negative result may occur with  improper specimen collection/handling, submission of specimen other than nasopharyngeal swab, presence of viral mutation(s) within the areas targeted by this assay, and inadequate number of viral copies(<138 copies/mL). A negative result must be combined with clinical observations, patient history, and epidemiological information. The expected result is Negative.  Fact Sheet for Patients:  EntrepreneurPulse.com.au  Fact Sheet for Healthcare Providers:  IncredibleEmployment.be  This test is no t yet approved or cleared by the [place] FDA and  has been authorized for detection and/or diagnosis of SARS-CoV-2 by FDA under an Emergency Use Authorization (EUA). This EUA will remain  in effect (meaning this test can be used) for the duration of the COVID-19 declaration under Section 564(b)(1) of the Act, 21 U.S.C.section 360bbb-3(b)(1), unless the authorization is terminated  or revoked sooner.       Influenza A by PCR NEGATIVE NEGATIVE Final   Influenza B by PCR NEGATIVE NEGATIVE Final    Comment: (NOTE) The Xpert Xpress SARS-CoV-2/FLU/RSV plus assay is intended as an aid in the diagnosis of influenza from Nasopharyngeal swab specimens and should not be used as a sole basis for treatment. Nasal washings and aspirates are unacceptable for Xpert Xpress  SARS-CoV-2/FLU/RSV testing.  Fact Sheet for Patients: EntrepreneurPulse.com.au  Fact Sheet for Healthcare Providers: IncredibleEmployment.be  This test is not yet approved or cleared by the [place] FDA and has been authorized for detection and/or diagnosis of SARS-CoV-2 by FDA under an Emergency Use Authorization (EUA). This EUA will remain in effect (meaning this test can be used) for the duration of the COVID-19 declaration under Section 564(b)(1) of the Act, 21 U.S.C. section 360bbb-3(b)(1), unless the authorization is terminated or revoked.     Resp Syncytial Virus by PCR NEGATIVE NEGATIVE Final    Comment: (NOTE) Fact Sheet for Patients: EntrepreneurPulse.com.au  Fact Sheet for Healthcare Providers: IncredibleEmployment.be  This test is not yet approved or cleared by the [place] FDA and has been authorized for detection and/or diagnosis of SARS-CoV-2 by FDA under an Emergency Use Authorization (EUA). This EUA will remain in effect (meaning this test can be used) for the duration of the COVID-19 declaration under Section 564(b)(1) of the Act, 21 U.S.C. section 360bbb-3(b)(1), unless the authorization is terminated or revoked.  Performed at [facility], [address], [place], [place] [address]   Blood Culture (routine x 2)  Status: None (Preliminary result)   Collection Time: 02/14/23  8:15 PM   Specimen: Left Antecubital; Blood  Result Value Ref Range Status   Specimen Description   Final    LEFT ANTECUBITAL BOTTLES DRAWN AEROBIC AND ANAEROBIC   Special Requests Blood Culture adequate volume  Final   Culture   Final    NO GROWTH < 12 HOURS Performed at [facility], [address] [place], [place] [address]    Report Status PENDING  Incomplete  Blood Culture (routine x 2)     Status: None (Preliminary result)   Collection Time: 02/14/23  8:24 PM   Specimen: BLOOD RIGHT HAND   Result Value Ref Range Status   Specimen Description   Final    BLOOD RIGHT HAND BOTTLES DRAWN AEROBIC AND ANAEROBIC   Special Requests Blood Culture adequate volume  Final   Culture   Final    NO GROWTH < 12 HOURS Performed at [facility], [address] [place], [place] [address]    Report Status PENDING  Incomplete         Radiology Studies: DG Chest 1 View  Result Date: 02/15/2023 CLINICAL DATA:  Dyspnea, chest pain EXAM: CHEST  1 VIEW COMPARISON:  02/14/2023 FINDINGS: The lungs are symmetrically well expanded. Superimposed mild interstitial pulmonary infiltrates appears improved in the interval, suggesting improving pulmonary edema given its relatively rapid resolution. No pneumothorax or pleural effusion. Right internal jugular chest port tip noted in the expected innominate vein. Coronary artery bypass grafting has been performed. Mild cardiomegaly is stable. IMPRESSION: 1. Interval improvement in pulmonary edema. 2. Stable mild cardiomegaly. Electronically Signed   By: Fidela Salisbury M.D.   On: 02/15/2023 01:10   DG Chest Port 1 View  Result Date: 02/14/2023 CLINICAL DATA:  Shortness of breath, chest pain EXAM: PORTABLE CHEST 1 VIEW COMPARISON:  Chest radiograph dated 01/06/2023. FINDINGS: The heart is enlarged. Vascular calcifications are seen in the aortic arch. There is mild diffuse bilateral interstitial opacities. Trace pleural effusions are difficult to exclude. There is no pneumothorax. A right internal jugular central venous port catheter tip overlies the superior vena cava or brachiocephalic vein. Degenerative changes are seen in the spine. IMPRESSION: Cardiomegaly with mild diffuse bilateral interstitial opacities, which may reflect pulmonary edema or atypical infection. Electronically Signed   By: Zerita Boers M.D.   On: 02/14/2023 19:03        Scheduled Meds:  allopurinol  300 mg Oral Daily   apixaban  5 mg Oral BID   atorvastatin  40 mg Oral QHS   dofetilide   500 mcg Oral BID   furosemide  40 mg Intravenous Q12H   irbesartan  37.5 mg Oral Daily   loratadine  10 mg Oral Daily   pantoprazole  40 mg Oral Daily   spironolactone  12.5 mg Oral Daily   Continuous Infusions:  azithromycin 500 mg (02/14/23 2040)     LOS: 1 day    Time spent: 51 minutes spent on chart review, discussion with nursing staff, consultants, updating family and interview/physical exam; more than 50% of that time was spent in counseling and/or coordination of care.    Viviann Broyles J [place] DO [facility] Available via Epic secure chat 7am-7pm After these hours, please refer to coverage provider listed on [email or web address] 02/15/2023, 9:52 AM

## 2023-02-15 NOTE — Progress Notes (Signed)
Patient is having repeated  short runs of V-tachs with 3-5 beats each time, patient is asymptomatic, printed and is kept on the physical chart, sleeping sound and responding well when awaken, HR sustaining at 80-90s, BP peaking up, now 166/81 with MAP 111 after  PRN dose of oral and IV Hydralazine, MD made aware, 2gm Magnesium given earlier,  MD made aware,Troponin ordered, results awaiting,, will continue to monitor and endorse.

## 2023-02-16 DIAGNOSIS — I4721 Torsades de pointes: Secondary | ICD-10-CM

## 2023-02-16 DIAGNOSIS — I4729 Other ventricular tachycardia: Secondary | ICD-10-CM

## 2023-02-16 DIAGNOSIS — I25810 Atherosclerosis of coronary artery bypass graft(s) without angina pectoris: Secondary | ICD-10-CM

## 2023-02-16 DIAGNOSIS — I5021 Acute systolic (congestive) heart failure: Secondary | ICD-10-CM

## 2023-02-16 DIAGNOSIS — I5033 Acute on chronic diastolic (congestive) heart failure: Secondary | ICD-10-CM | POA: Diagnosis not present

## 2023-02-16 DIAGNOSIS — I480 Paroxysmal atrial fibrillation: Secondary | ICD-10-CM

## 2023-02-16 LAB — COMPREHENSIVE METABOLIC PANEL
ALT: 24 U/L (ref 0–44)
AST: 20 U/L (ref 15–41)
Albumin: 3.5 g/dL (ref 3.5–5.0)
Alkaline Phosphatase: 130 U/L — ABNORMAL HIGH (ref 38–126)
Anion gap: 10 (ref 5–15)
BUN: 12 mg/dL (ref 8–23)
CO2: 26 mmol/L (ref 22–32)
Calcium: 8.1 mg/dL — ABNORMAL LOW (ref 8.9–10.3)
Chloride: 99 mmol/L (ref 98–111)
Creatinine, Ser: 1.07 mg/dL (ref 0.61–1.24)
GFR, Estimated: >60 mL/min (ref >60)
Glucose, Bld: 168 mg/dL — ABNORMAL HIGH (ref 70–99)
Potassium: 3.2 mmol/L — ABNORMAL LOW (ref 3.5–5.1)
Sodium: 135 mmol/L (ref 135–145)
Total Bilirubin: 0.9 mg/dL (ref 0.3–1.2)
Total Protein: 6.9 g/dL (ref 6.5–8.1)

## 2023-02-16 LAB — TROPONIN I (HIGH SENSITIVITY)
Troponin I (High Sensitivity): 31 ng/L — ABNORMAL HIGH (ref <18)
Troponin I (High Sensitivity): 31 ng/L — ABNORMAL HIGH (ref <18)

## 2023-02-16 LAB — PHOSPHORUS: Phosphorus: 3 mg/dL (ref 2.5–4.6)

## 2023-02-16 LAB — SEDIMENTATION RATE: Sed Rate: 30 mm/hr — ABNORMAL HIGH (ref 0–16)

## 2023-02-16 LAB — CBC WITH DIFFERENTIAL/PLATELET
Abs Immature Granulocytes: 0.48 10*3/uL — ABNORMAL HIGH (ref 0.00–0.07)
Basophils Absolute: 0.1 10*3/uL (ref 0.0–0.1)
Basophils Relative: 0 %
Eosinophils Absolute: 0.1 10*3/uL (ref 0.0–0.5)
Eosinophils Relative: 0 %
HCT: 37 % — ABNORMAL LOW (ref 39.0–52.0)
Hemoglobin: 12.4 g/dL — ABNORMAL LOW (ref 13.0–17.0)
Immature Granulocytes: 3 %
Lymphocytes Relative: 9 %
Lymphs Abs: 1.8 10*3/uL (ref 0.7–4.0)
MCH: 30.1 pg (ref 26.0–34.0)
MCHC: 33.5 g/dL (ref 30.0–36.0)
MCV: 89.8 fL (ref 80.0–100.0)
Monocytes Absolute: 1.7 10*3/uL — ABNORMAL HIGH (ref 0.1–1.0)
Monocytes Relative: 9 %
Neutro Abs: 15.5 10*3/uL — ABNORMAL HIGH (ref 1.7–7.7)
Neutrophils Relative %: 79 %
Platelets: 183 10*3/uL (ref 150–400)
RBC: 4.12 MIL/uL — ABNORMAL LOW (ref 4.22–5.81)
RDW: 15.2 % (ref 11.5–15.5)
WBC: 19.6 10*3/uL — ABNORMAL HIGH (ref 4.0–10.5)
nRBC: 0 % (ref 0.0–0.2)

## 2023-02-16 LAB — BASIC METABOLIC PANEL
Anion gap: 9 (ref 5–15)
BUN: 15 mg/dL (ref 8–23)
CO2: 26 mmol/L (ref 22–32)
Calcium: 8.3 mg/dL — ABNORMAL LOW (ref 8.9–10.3)
Chloride: 100 mmol/L (ref 98–111)
Creatinine, Ser: 1.16 mg/dL (ref 0.61–1.24)
GFR, Estimated: >60 mL/min (ref >60)
Glucose, Bld: 171 mg/dL — ABNORMAL HIGH (ref 70–99)
Potassium: 4.2 mmol/L (ref 3.5–5.1)
Sodium: 135 mmol/L (ref 135–145)

## 2023-02-16 LAB — GLUCOSE, CAPILLARY
Glucose-Capillary: 183 mg/dL — ABNORMAL HIGH (ref 70–99)
Glucose-Capillary: 270 mg/dL — ABNORMAL HIGH (ref 70–99)
Glucose-Capillary: 176 mg/dL — ABNORMAL HIGH (ref 70–99)
Glucose-Capillary: 190 mg/dL — ABNORMAL HIGH (ref 70–99)

## 2023-02-16 LAB — MAGNESIUM
Magnesium: 2.2 mg/dL (ref 1.7–2.4)
Magnesium: 2.4 mg/dL (ref 1.7–2.4)

## 2023-02-16 LAB — C-REACTIVE PROTEIN: CRP: 1.6 mg/dL — ABNORMAL HIGH (ref <1.0)

## 2023-02-16 LAB — HEMOGLOBIN A1C
Hgb A1c MFr Bld: 8.3 % — ABNORMAL HIGH (ref 4.8–5.6)
Mean Plasma Glucose: 192 mg/dL

## 2023-02-16 MED ORDER — DOCUSATE SODIUM 100 MG PO CAPS
100.0000 mg | ORAL_CAPSULE | Freq: Two times a day (BID) | ORAL | Status: DC
  Administered 2023-02-16 – 2023-02-17 (×2): 100 mg via ORAL
  Filled 2023-02-16 (×2): qty 1

## 2023-02-16 MED ORDER — SACUBITRIL-VALSARTAN 49-51 MG PO TABS
1.0000 | ORAL_TABLET | Freq: Two times a day (BID) | ORAL | Status: DC
  Filled 2023-02-16 (×5): qty 1

## 2023-02-16 MED ORDER — POTASSIUM CHLORIDE 10 MEQ/100ML IV SOLN
10.0000 meq | INTRAVENOUS | Status: AC
  Administered 2023-02-16 (×4): 10 meq via INTRAVENOUS
  Filled 2023-02-16 (×4): qty 100

## 2023-02-16 MED ORDER — SACUBITRIL-VALSARTAN 49-51 MG PO TABS
1.0000 | ORAL_TABLET | Freq: Two times a day (BID) | ORAL | Status: DC
  Administered 2023-02-16 – 2023-02-17 (×2): 1 via ORAL
  Filled 2023-02-16 (×4): qty 1

## 2023-02-16 MED ORDER — POTASSIUM CHLORIDE CRYS ER 20 MEQ PO TBCR
40.0000 meq | EXTENDED_RELEASE_TABLET | ORAL | Status: AC
  Administered 2023-02-16 (×2): 40 meq via ORAL
  Filled 2023-02-16 (×2): qty 2

## 2023-02-16 NOTE — Telephone Encounter (Signed)
Apt made for 02/24/23 with Dr.Taylor

## 2023-02-16 NOTE — Plan of Care (Signed)

## 2023-02-16 NOTE — Consult Note (Signed)
Cardiology Consultation   Patient ID: Alejandro [name] MRN: [id number]; DOB: 1941-09-26  Admit date: 02/14/2023 Date of Consult: 02/16/2023  PCP:  Pablo Lawrence, NP   [facility] [facility] Cardiologist:  Rozann Lesches, MD        Patient Profile:   Alejandro [name] is a 82 y.o. male with a hx of  with history of CAD CABG 2019, ICM LVEF 45%, combined CHF, mod AS, CKD stage 3b, mod MR  who is being seen 02/16/2023 for the evaluation of Torsades at the request of Dr. [place]  History of Present Illness:   Alejandro [name] with above PMHx was admitted with acute on chronic combined CHF. Echo yest EF 45-50% mod AS similar to prior echo. He's been on Tikosyn for PAF and had torsades with low Mg. Has history of bradycardia and off all AV nodal blocking agents. K 3.2 today. WBC 19.6-just had chemo for follicular lymphoma stage 3a. Patient says his last chemo treatment really got him down. He lives alone and eats out daily but tries to watch his salt. He became acutely SOB and came to ED. Denies palpitations, dizziness or syncope. Last night he complained of chest tightness that he says was from the way he was laying in bed and eased when he repositioned himself. Hasn't had chest pain recently. Still having runs of NSVT but refuses transfer to [facility].   Past Medical History:  Diagnosis Date   A-fib [facility])    AAA Ultrasound 01/11/2020    AAA [place] 01/11/20:  no abdominal aortic aneurysm, 2.4 cm   Aortic insufficiency    a. mild-mod by intraop TEE 05/2018 (no aortic stenosis) // Echo 2/21: mild //  Echo 9/22: trivial   Aortic stenosis    Echo 9/22: mild (mean 16) // Echo 2/21: mild (mean 18.7)   CAD (coronary artery disease)    a. NSTEMI 05/2018 with multivessel disease -> s/p CABGx4 05/11/18 // Myoview 06/07/20: inf scar w mild peri-infarct ischemia, EF 37; Int Risk   Cancer (HCC)    Carotid artery disease ([place])    PreCABG [place] 05/07/18: Bilat ICA 1-39   CKD (chronic kidney disease), stage III ([place])     HFimpEF (heart failure with improved EF)    Echocardiogram 01/11/20: EF 45, Gr 3 DD, mild AI, mild AS (mean 18.7 mmHg) // Echocardiogram 08/14/21:  EF 55-60, inf HK, mild LVH, Gr 1 DD, normal RVSF, RVSP 25.3, mild LAE, trivial MR, MAC, trivial AI, mild AS (mean 16 mmHg, Vmax 283 cm/s, DI 0.41)   History of nephrolithiasis 2004   Hyperlipidemia    Hypertension    IBS (irritable colon syndrome)    Ischemic cardiomyopathy    Mitral regurgitation    Echo 9/22: trivial MR   Postoperative atrial fibrillation (HCC)    PVC's (premature ventricular contractions)    Reflux    Type 2 diabetes mellitus ([place])     Past Surgical History:  Procedure Laterality Date   BRONCHIAL NEEDLE ASPIRATION BIOPSY  11/17/2022   Procedure: BRONCHIAL NEEDLE ASPIRATION BIOPSIES;  Surgeon: Collene Gobble, MD;  Location: [place];  Service: Cardiopulmonary;;   CARDIOVERSION     CORONARY ARTERY BYPASS GRAFT N/A 05/11/2018   Procedure: CORONARY ARTERY BYPASS GRAFTING (CABG), on pump, times four, using left internal mammary artery and endoscopically harvested right greater saphenous leg vein.;  Surgeon: Ivin Poot, MD;  Location: [place];  Service: Open Heart Surgery;  Laterality: N/A;   ESOPHAGOGASTRODUODENOSCOPY  02/04/2012   Procedure:  ESOPHAGOGASTRODUODENOSCOPY (EGD);  Surgeon: Rogene Houston, MD;  [facility];  Service: Endoscopy;  Laterality: N/A;  100   INGUINAL LYMPH NODE BIOPSY Left 12/15/2022   Procedure: INGUINAL LYMPH NODE BIOPSY;  Surgeon: Rusty Aus, DO;  Location: [facility] ORS;  Service: General;  Laterality: Left;   LEFT HEART CATH AND CORONARY ANGIOGRAPHY N/A 05/04/2018   Procedure: LEFT HEART CATH AND CORONARY ANGIOGRAPHY;  Surgeon: Belva Crome, MD;  Location: [place] CV LAB;  Service: Cardiovascular;  Laterality: N/A;   NASAL FRACTURE SURGERY     PORTACATH PLACEMENT Right 01/06/2023   Procedure: INSERTION PORT-A-CATH;  Surgeon: Rusty Aus, DO;  Location: [facility];  Service: General;  Laterality: Right;   TEE WITHOUT CARDIOVERSION N/A 05/11/2018   Procedure: TRANSESOPHAGEAL ECHOCARDIOGRAM (TEE);  Surgeon: Prescott Gum, Collier Salina, MD;  Location: [place];  Service: Open Heart Surgery;  Laterality: N/A;   VIDEO BRONCHOSCOPY WITH ENDOBRONCHIAL ULTRASOUND Right 11/17/2022   Procedure: VIDEO BRONCHOSCOPY WITH ENDOBRONCHIAL ULTRASOUND;  Surgeon: Collene Gobble, MD;  Location: [place];  Service: Cardiopulmonary;  Laterality: Right;     Home Medications:  Prior to Admission medications   Medication Sig Start Date End Date Taking? Authorizing Provider  acetaminophen (TYLENOL) 500 MG tablet Take 1,000 mg by mouth every 6 (six) hours as needed for moderate pain or mild pain.    [provider]  albuterol (VENTOLIN HFA) 108 (90 Base) MCG/ACT inhaler Inhale 2 puffs into the lungs every 6 (six) hours as needed for wheezing or shortness of breath. 09/22/22   [provider]  allopurinol (ZYLOPRIM) 300 MG tablet Take 1 tablet (300 mg total) by mouth daily. 01/05/23   Derek Jack, MD  apixaban (ELIQUIS) 5 MG TABS tablet Take 1 tablet (5 mg total) by mouth 2 (two) times daily. 01/08/23   Pappayliou, Barnetta Chapel A, DO  atorvastatin (LIPITOR) 40 MG tablet Take 40 mg by mouth at bedtime.    [provider]  BENDAMUSTINE HCL IV Inject into the vein every 28 (twenty-eight) days. 01/07/23   [provider]  cyanocobalamin (VITAMIN B12) 1000 MCG tablet Take 1,000 mcg by mouth daily.    [provider]  cyanocobalamin (VITAMIN B12) 1000 MCG/ML injection Inject 1,000 mcg into the skin every 30 (thirty) days. 10/20/22   [provider]  docusate sodium (COLACE) 100 MG capsule Take 1 capsule (100 mg total) by mouth 2 (two) times daily. Patient taking differently: Take 100 mg by mouth 2 (two) times daily as needed for mild constipation or moderate constipation. 12/15/22 12/15/23  Pappayliou, Barnetta Chapel A, DO  dofetilide (TIKOSYN) 500  MCG capsule Take 500 mcg by mouth 2 (two) times daily.    [provider]  famotidine (PEPCID) 20 MG tablet Take 1 tablet (20 mg total) by mouth 2 (two) times daily. 11/17/22   Collene Gobble, MD  ferrous sulfate 325 (65 FE) MG EC tablet Take 1 tablet (325 mg total) by mouth daily with breakfast. 02/04/23   Tarri Abernethy M, PA-C  fluticasone (FLONASE) 50 MCG/ACT nasal spray Place 1 spray into both nostrils daily. 02/02/23   Harriett Rush, PA-C  glipiZIDE (GLUCOTROL XL) 10 MG 24 hr tablet Take 10 mg by mouth daily.    [provider]  lidocaine-prilocaine (EMLA) cream Apply to affected area once Patient not taking: Reported on 02/04/2023 01/05/23   Derek Jack, MD  loratadine (CLARITIN) 10 MG tablet Take 10 mg by mouth daily. 04/25/22   [provider]  Melatonin 10 MG CAPS Take 30 mg by mouth at bedtime as needed (sleep).    [provider]  metFORMIN (GLUCOPHAGE-XR) 750 MG 24 hr tablet Take 750 mg by mouth 2 (two) times daily with a meal.    [provider]  metFORMIN (GLUCOPHAGE-XR) 750 MG 24 hr tablet Take 1 tablet by mouth 2 (two) times daily with a meal. 01/05/23   [provider]  oxyCODONE (ROXICODONE) 5 MG immediate release tablet Take 1 tablet (5 mg total) by mouth every 6 (six) hours as needed. Patient taking differently: Take 5 mg by mouth every 6 (six) hours as needed for severe pain or moderate pain. 12/15/22   Pappayliou, Barnetta Chapel A, DO  pantoprazole (PROTONIX) 40 MG tablet Take 40 mg by mouth daily. 02/07/20   [provider]  polyethylene glycol (MIRALAX / GLYCOLAX) 17 g packet Take 17 g by mouth daily. Patient taking differently: Take 17 g by mouth daily as needed for moderate constipation. 12/07/22   Godfrey Pick, MD  potassium chloride (MICRO-K) 10 MEQ CR capsule Take 10 mEq by mouth daily. 06/12/21   [provider]  potassium chloride (MICRO-K) 10 MEQ CR capsule Take 1 capsule by mouth daily. 01/05/23    [provider]  Povidone, PF, (IVIZIA DRY EYES) 0.5 % SOLN Place 1 drop into both eyes daily as needed (dry eyes).    [provider]  prochlorperazine (COMPAZINE) 10 MG tablet Take 1 tablet (10 mg total) by mouth every 6 (six) hours as needed for nausea or vomiting. 01/05/23   Derek Jack, MD  riTUXimab (RITUXAN IV) Inject into the vein every 28 (twenty-eight) days. 01/07/23   [provider]  sodium chloride (OCEAN) 0.65 % SOLN nasal spray Place 1 spray into both nostrils as needed for congestion. 01/19/23   Harriett Rush, PA-C  spironolactone (ALDACTONE) 25 MG tablet Take 0.5 tablets (12.5 mg total) by mouth daily. 12/18/22 12/07/24  Furth, Cadence H, PA-C  valsartan (DIOVAN) 40 MG tablet Take 1 tablet (40 mg total) by mouth daily. 01/07/22   Imogene Burn, PA-C    Inpatient Medications: Scheduled Meds:  allopurinol  300 mg Oral Daily   apixaban  5 mg Oral BID   atorvastatin  40 mg Oral QHS   Chlorhexidine Gluconate Cloth  6 each Topical Daily   furosemide  40 mg Intravenous Q12H   insulin aspart  0-5 Units Subcutaneous QHS   insulin aspart  0-6 Units Subcutaneous TID WC   irbesartan  37.5 mg Oral Daily   loratadine  10 mg Oral Daily   pantoprazole  40 mg Oral Daily   potassium chloride  40 mEq Oral Q3H   spironolactone  12.5 mg Oral Daily   Continuous Infusions:  PRN Meds: acetaminophen **OR** acetaminophen, hydrALAZINE, hydrALAZINE, melatonin, oxyCODONE  Allergies:   No Known Allergies  Social History:   Social History   Socioeconomic History   Marital status: Widowed    Spouse name: Not on file   Number of children: Not on file   Years of education: Not on file   Highest education level: Not on file  Occupational History   Not on file  Tobacco Use   Smoking status: Never   Smokeless tobacco: Never  Vaping Use   Vaping Use: Never used  Substance and Sexual Activity   Alcohol use: No    Alcohol/week: 0.0 standard drinks of  alcohol   Drug use: No   Sexual activity: Yes  Other Topics Concern  Not on file  Social History Narrative   Not on file   Social Determinants of Health   Financial Resource Strain: Not on file  Food Insecurity: Not on file  Transportation Needs: Not on file  Physical Activity: Not on file  Stress: Not on file  Social Connections: Not on file  Intimate Partner Violence: Not on file    Family History:     Family History  Problem Relation Age of Onset   Diabetes Mellitus II Mother    Heart attack Father    Heart attack Brother    Heart disease Other    Arthritis Other    Asthma Other    Diabetes Other      ROS:  Please see the history of present illness.  Review of Systems  Constitutional: Negative.  HENT: Negative.    Cardiovascular:  Positive for chest pain, dyspnea on exertion, irregular heartbeat, leg swelling and orthopnea.  Respiratory:  Positive for cough and shortness of breath.   Endocrine: Negative.   Hematologic/Lymphatic: Negative.   Musculoskeletal: Negative.   Gastrointestinal: Negative.   Genitourinary: Negative.   Neurological:  Positive for weakness.    All other ROS reviewed and negative.     Physical Exam/Data:   Vitals:   02/16/23 0400 02/16/23 0412 02/16/23 0500 02/16/23 0600  BP: (!) 147/76  (!) 142/69 (!) 159/75  Pulse: 78  67 82  Resp: 19  11 20   Temp:  98.6 F (37 C)    TempSrc:  Oral    SpO2: 100%  99% 96%  Weight:  104.4 kg    Height:        Intake/Output Summary (Last 24 hours) at 02/16/2023 0748 Last data filed at 02/16/2023 0716 Gross per 24 hour  Intake 720 ml  Output 3045 ml  Net -2325 ml      02/16/2023    4:12 AM 02/14/2023    6:01 PM 02/04/2023    7:50 AM  Last 3 Weights  Weight (lbs) 230 lb 2.6 oz 235 lb 241 lb 6.4 oz  Weight (kg) 104.4 kg 106.595 kg 109.498 kg     Body mass index is 32.1 kg/m.  General:  Obese, in no acute distress  HEENT: normal Neck: no JVD Vascular: No carotid bruits; Distal pulses 2+  bilaterally Cardiac:  normal S1, S2; irrreg with 3/6 systolic murmur LSB and apex Lungs:   rales and rhonchi at bases  Abd: soft, nontender, no hepatomegaly  Ext: trace edema Musculoskeletal:  No deformities, BUE and BLE strength normal and equal Skin: warm and dry  Neuro:  CNs 2-12 intact, no focal abnormalities noted Psych:  Normal affect   EKG:  The EKG was personally reviewed and demonstrates:  NSR with PVC's NSVT Telemetry:  Telemetry was personally reviewed and demonstrates:  NSR/AFib with runs NSVT  Relevant CV Studies: Echo 02/15/23 IMPRESSIONS     1. Left ventricular ejection fraction, by estimation, is 45 to 50%. The  left ventricle has mildly decreased function. The left ventricle  demonstrates regional wall motion abnormalities (see scoring  diagram/findings for description). The left ventricular   internal cavity size was moderately dilated. Left ventricular diastolic  parameters are consistent with Grade II diastolic dysfunction  (pseudonormalization).   2. Right ventricular systolic function is normal. The right ventricular  size is mildly enlarged. There is mildly elevated pulmonary artery  systolic pressure.   3. Left atrial size was severely dilated.   4. Right atrial size was mildly dilated.  5. The mitral valve is normal in structure. No evidence of mitral valve  regurgitation. No evidence of mitral stenosis.   6. The aortic valve is calcified. There is moderate calcification of the  aortic valve. There is moderate thickening of the aortic valve. Aortic  valve regurgitation is not visualized. Moderate aortic valve stenosis.  Aortic valve mean gradient measures  19.0 mmHg. Aortic valve Vmax measures 2.97 m/s.   7. Aortic dilatation noted. There is mild dilatation of the aortic root,  measuring 43 mm.   8. The inferior vena cava is normal in size with greater than 50%  respiratory variability, suggesting right atrial pressure of 3 mmHg.   Comparison(s): No  significant change from prior study. Prior images  reviewed side by side.     Echo 08/2022 IMPRESSIONS     1. Left ventricular ejection fraction, by estimation, is 50 to 55%. The  left ventricle has low normal function. The left ventricle has no regional  wall motion abnormalities. The left ventricular internal cavity size was  mildly dilated. There is abnormal   septal motion. Recommend obtaining Limited Echo with global longitudinal  strain.   2. Right ventricular systolic function is low normal. The right  ventricular size is normal.   3. The mitral valve is degenerative. Trivial mitral valve regurgitation.  No evidence of mitral stenosis.   4. The aortic valve is calcified. Aortic valve regurgitation is trivial.  Moderate aortic valve stenosis.   5. There is moderate dilatation of the aortic root, measuring 44 mm.   6. The inferior vena cava is normal in size with greater than 50%  respiratory variability, suggesting right atrial pressure of 3 mmHg.   NST: 01/2022   Findings are consistent with prior inferior myocardial infarction. There is no current ischemia.  The study is intermediate risk. Risk based on decreased LVEF and scar, there is no current myocardium at jeopardy. Consider correlating LVEF with echo.   No ST deviation was noted.   LV perfusion is abnormal. Moderate size moderate intensity inferior defect that is fixed.   Left ventricular function is abnormal. Nuclear stress EF: 39 %. The left ventricular ejection fraction is moderately decreased (30-44%). End diastolic cavity size is mildly enlarged.   Limited Echocardiogram: 01/2022 IMPRESSIONS     1. Left ventricular ejection fraction, by estimation, is 55 to 60%. The  left ventricle has normal function. Left ventricular endocardial border  not optimally defined to evaluate regional wall motion.   2. The mitral valve is normal in structure. Trivial mitral valve  regurgitation. No evidence of mitral stenosis.    3. The aortic valve is tricuspid. There is moderate calcification of the  aortic valve. There is moderate thickening of the aortic valve. Aortic  valve regurgitation is not visualized. Mild aortic valve stenosis. Aortic  valve mean gradient measures 15.1  mmHg. Aortic valve peak gradient measures 30.2 mmHg. Aortic valve area, by  VTI measures 1.97 cm.      4. Aortic dilatation noted. There is mild dilatation of the aortic root,  measuring 41 mm.   5. Limited echo to evaluate LV function    Event Monitor: 01/2022 ZIO XT reviewed.  10 days, 13 hours analyzed.  Predominant rhythm is sinus with prolonged PR interval.  Heart rate ranged from 48 bpm up to 114 bpm with average heart rate 72 bpm.   There were frequent PACs including couplets and triplets representing approximately 17% total beats.  There were otherwise rare PVCs including couplets  and triplets representing less than 1% total beats.  Limited ventricular bigeminy and trigeminy noted.   There were multiple episodes of brief PSVT, the longest of which lasted for 13 beats.  Paroxysmal atrial fibrillation was also noted although with only 1% total rhythm burden.  The longest episode of atrial fibrillation was 3 hours and 43 minutes on February 8 with average heart rate in the 90s.   There were no significant pauses.    Laboratory Data:  High Sensitivity Troponin:   Recent Labs  Lab 02/14/23 1835 02/15/23 2324 02/16/23 0304  TROPONINIHS 19* 31* 31*     Chemistry Recent Labs  Lab 02/15/23 0440 02/15/23 1036 02/16/23 0036  NA 141 138 135  K 3.7 3.7 3.2*  CL 105 100 99  CO2 26 26 26   GLUCOSE 161* 265* 168*  BUN 11 12 12   CREATININE 1.05 1.06 1.07  CALCIUM 8.3* 8.4* 8.1*  MG 1.5* 2.2 2.2  GFRNONAA >60 >60 >60  ANIONGAP 10 12 10     Recent Labs  Lab 02/14/23 1835 02/16/23 0036  PROT 6.7 6.9  ALBUMIN 3.4* 3.5  AST 25 20  ALT 24 24  ALKPHOS 132* 130*  BILITOT 0.5 0.9   Lipids No results for input(s): "CHOL",  "TRIG", "HDL", "LABVLDL", "LDLCALC", "CHOLHDL" in the last 168 hours.  Hematology Recent Labs  Lab 02/14/23 1835 02/15/23 0440 02/16/23 0036  WBC 25.1* 22.9* 19.6*  RBC 3.76* 3.91* 4.12*  HGB 11.4* 11.7* 12.4*  HCT 34.4* 35.7* 37.0*  MCV 91.5 91.3 89.8  MCH 30.3 29.9 30.1  MCHC 33.1 32.8 33.5  RDW 15.2 15.4 15.2  PLT 156 171 183   Thyroid No results for input(s): "TSH", "FREET4" in the last 168 hours.  BNP Recent Labs  Lab 02/14/23 1835  BNP 890.0*    DDimer No results for input(s): "DDIMER" in the last 168 hours.   Radiology/Studies:  ECHOCARDIOGRAM COMPLETE  Result Date: 02/15/2023    ECHOCARDIOGRAM REPORT   Patient Name:   Alejandro [name] Date of Exam: 02/15/2023 Medical Rec #:  [id number]      Height:       71.0 in Accession #:    [id number]     Weight:       235.0 lb Date of Birth:  11-17-1941       BSA:          2.258 m Patient Age:    41 years       BP:           171/69 mmHg Patient Gender: M              HR:           67 bpm. Exam Location:  [facility] Procedure: 2D Echo, Cardiac Doppler and Color Doppler Indications:    Dyspnea R06.00  History:        Patient has prior history of Echocardiogram examinations, most                 recent 09/19/2022. CHF and Cardiomyopathy, CAD and Previous                 Myocardial Infarction, Prior CABG, Arrythmias:Atrial                 Fibrillation and PVC; Risk Factors:Hypertension, Dyslipidemia                 and Diabetes. Hx of (HFimpEF) heart failure with improved EF.  Sonographer:  Alvino Chapel RCS Referring Phys: [id number] [place]  1. Left ventricular ejection fraction, by estimation, is 45 to 50%. The left ventricle has mildly decreased function. The left ventricle demonstrates regional wall motion abnormalities (see scoring diagram/findings for description). The left ventricular  internal cavity size was moderately dilated. Left ventricular diastolic parameters are consistent with Grade II diastolic  dysfunction (pseudonormalization).  2. Right ventricular systolic function is normal. The right ventricular size is mildly enlarged. There is mildly elevated pulmonary artery systolic pressure.  3. Left atrial size was severely dilated.  4. Right atrial size was mildly dilated.  5. The mitral valve is normal in structure. No evidence of mitral valve regurgitation. No evidence of mitral stenosis.  6. The aortic valve is calcified. There is moderate calcification of the aortic valve. There is moderate thickening of the aortic valve. Aortic valve regurgitation is not visualized. Moderate aortic valve stenosis. Aortic valve mean gradient measures 19.0 mmHg. Aortic valve Vmax measures 2.97 m/s.  7. Aortic dilatation noted. There is mild dilatation of the aortic root, measuring 43 mm.  8. The inferior vena cava is normal in size with greater than 50% respiratory variability, suggesting right atrial pressure of 3 mmHg. Comparison(s): No significant change from prior study. Prior images reviewed side by side. FINDINGS  Left Ventricle: Left ventricular ejection fraction, by estimation, is 45 to 50%. The left ventricle has mildly decreased function. The left ventricle demonstrates regional wall motion abnormalities. The left ventricular internal cavity size was moderately dilated. There is no left ventricular hypertrophy. Left ventricular diastolic parameters are consistent with Grade II diastolic dysfunction (pseudonormalization).  LV Wall Scoring: The basal inferior segment and basal inferoseptal segment are akinetic. Right Ventricle: The right ventricular size is mildly enlarged. No increase in right ventricular wall thickness. Right ventricular systolic function is normal. There is mildly elevated pulmonary artery systolic pressure. The tricuspid regurgitant velocity is 3.20 m/s, and with an assumed right atrial pressure of 3 mmHg, the estimated right ventricular systolic pressure is 123XX123 mmHg. Left Atrium: Left atrial  size was severely dilated. Right Atrium: Right atrial size was mildly dilated. Pericardium: There is no evidence of pericardial effusion. Mitral Valve: The mitral valve is normal in structure. No evidence of mitral valve regurgitation. No evidence of mitral valve stenosis. Tricuspid Valve: The tricuspid valve is normal in structure. Tricuspid valve regurgitation is mild . No evidence of tricuspid stenosis. Aortic Valve: The aortic valve is calcified. There is moderate calcification of the aortic valve. There is moderate thickening of the aortic valve. Aortic valve regurgitation is not visualized. Moderate aortic stenosis is present. Aortic valve mean gradient measures 19.0 mmHg. Aortic valve peak gradient measures 35.3 mmHg. Aortic valve area, by VTI measures 1.91 cm. Pulmonic Valve: The pulmonic valve was normal in structure. Pulmonic valve regurgitation is mild. No evidence of pulmonic stenosis. Aorta: Aortic dilatation noted. There is mild dilatation of the aortic root, measuring 43 mm. Venous: The inferior vena cava is normal in size with greater than 50% respiratory variability, suggesting right atrial pressure of 3 mmHg. IAS/Shunts: No atrial level shunt detected by color flow Doppler.  LEFT VENTRICLE PLAX 2D LVIDd:         6.40 cm   Diastology LVIDs:         4.70 cm   LV e' medial:    7.07 cm/s LV PW:         1.40 cm   LV E/e' medial:  20.2 LV IVS:  1.10 cm   LV e' lateral:   10.80 cm/s LVOT diam:     2.50 cm   LV E/e' lateral: 13.2 LV SV:         130 LV SV Index:   57 LVOT Area:     4.91 cm  RIGHT VENTRICLE RV S prime:     12.30 cm/s TAPSE (M-mode): 2.1 cm LEFT ATRIUM              Index        RIGHT ATRIUM           Index LA diam:        5.70 cm  2.52 cm/m   RA Area:     26.10 cm LA Vol (A2C):   96.6 ml  42.78 ml/m  RA Volume:   80.30 ml  35.56 ml/m LA Vol (A4C):   124.0 ml 54.91 ml/m LA Biplane Vol: 115.0 ml 50.93 ml/m  AORTIC VALVE AV Area (Vmax):    1.69 cm AV Area (Vmean):   1.69 cm AV  Area (VTI):     1.91 cm AV Vmax:           297.00 cm/s AV Vmean:          201.000 cm/s AV VTI:            0.680 m AV Peak Grad:      35.3 mmHg AV Mean Grad:      19.0 mmHg LVOT Vmax:         102.00 cm/s LVOT Vmean:        69.100 cm/s LVOT VTI:          0.264 m LVOT/AV VTI ratio: 0.39  AORTA Ao Root diam: 4.30 cm MITRAL VALVE                TRICUSPID VALVE MV Area (PHT): 5.27 cm     TR Peak grad:   41.0 mmHg MV Decel Time: 144 msec     TR Vmax:        320.00 cm/s MV E velocity: 143.00 cm/s MV A velocity: 73.80 cm/s   SHUNTS MV E/A ratio:  1.94         Systemic VTI:  0.26 m                             Systemic Diam: 2.50 cm Candee Furbish MD Electronically signed by Candee Furbish MD Signature Date/Time: 02/15/2023/12:29:11 PM    Final    DG Chest 1 View  Result Date: 02/15/2023 CLINICAL DATA:  Dyspnea, chest pain EXAM: CHEST  1 VIEW COMPARISON:  02/14/2023 FINDINGS: The lungs are symmetrically well expanded. Superimposed mild interstitial pulmonary infiltrates appears improved in the interval, suggesting improving pulmonary edema given its relatively rapid resolution. No pneumothorax or pleural effusion. Right internal jugular chest port tip noted in the expected innominate vein. Coronary artery bypass grafting has been performed. Mild cardiomegaly is stable. IMPRESSION: 1. Interval improvement in pulmonary edema. 2. Stable mild cardiomegaly. Electronically Signed   By: Fidela Salisbury M.D.   On: 02/15/2023 01:10   DG Chest Port 1 View  Result Date: 02/14/2023 CLINICAL DATA:  Shortness of breath, chest pain EXAM: PORTABLE CHEST 1 VIEW COMPARISON:  Chest radiograph dated 01/06/2023. FINDINGS: The heart is enlarged. Vascular calcifications are seen in the aortic arch. There is mild diffuse bilateral interstitial opacities. Trace pleural effusions are difficult to exclude. There is no pneumothorax. A  right internal jugular central venous port catheter tip overlies the superior vena cava or brachiocephalic vein.  Degenerative changes are seen in the spine. IMPRESSION: Cardiomegaly with mild diffuse bilateral interstitial opacities, which may reflect pulmonary edema or atypical infection. Electronically Signed   By: Zerita Boers M.D.   On: 02/14/2023 19:03     Assessment and Plan:   Torsades on tikosyn and hypomagnesemia-tikosyn stopped and Mg replaced K 3.2 today and receiving replacement. Patient refuses to transfer to [facility] for further w/u and treatment. Continue to monitor and replace K, Mg. Early f/u with Dr. 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 Continue IV lasix  PAF on  Eliquis and now tikosyn stopped-will need early f/u in EP clinic  History of bradycardia no longer on AV nodal blocking agents  History of PSVT  CAD CABG 2019, no ischemia on NST 01/2022-chest pressure last night patient says was due to position in bed and eased with repositioning. No chest pain at home.  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 The patient's score is based upon: CHF History: 1 HTN History: 1 Diabetes History: 1 Stroke History: 0 Vascular Disease History: 1 Age Score: 2 Gender Score: 0         For questions or updates, please contact [place] Please consult [email or web address] for contact info under    Signed, Ermalinda Barrios, PA-C  02/16/2023 7:48 AM

## 2023-02-16 NOTE — Progress Notes (Signed)
Patient reported chest pain, chest pain protocal started, troponin 31, EKG done printed and in the physical chart, MD made aware, repaeated short runs of Vtachs for 3-5 beats each time, got orders to monitor for now, labs drawn, results awaiting. Will continue to monitor.

## 2023-02-16 NOTE — Progress Notes (Signed)
PROGRESS NOTE    Alejandro [name]  [id number] DOB: 05-13-1941 DOA: 02/14/2023 PCP: Pablo Lawrence, NP    Brief Narrative:   Alejandro [name] is a 82 y.o. male with past medical history significant for paroxysmal atrial fibrillation, aortic stenosis, CAD, CKD stage IIIb, chronic diastolic congestive heart failure, HTN, obesity, stage III high-grade follicular lymphoma who presented to [facility] ED on 3/16 with complaints of shortness of breath.  Additionally he reports increasing lower extremity edema over the last few days.  Denies chest pain, no cough, no fever/chills.  Patient with recent chemotherapy treatment on 02/04/2023 with good toleration.  In the ED, temperature 98.8 F, HR 97, RR 31, BP 180/89, SpO2 92% on room air.  WBC 25.1, hemoglobin 11.4, platelets 156.  Sodium 137, potassium 3.3, chloride 103, CO2 24, glucose 192, BUN 11, creatinine 1.15.  AST 25, ALT 24, total bilirubin 0.5.  BNP 890.0.  High sensitive troponin 19.  Procalcitonin 0.11.  COVID/RSV/influenza PCR negative.  Chest x-ray with cardiomegaly with mild diffuse bilateral interstitial opacities consistent with pulmonary edema.  EDP started azithromycin, ceftriaxone, given 1 dose of Lasix 40 mg IV.  TRH consulted for admission for further evaluation management for concern of acute on chronic diastolic congestive heart failure exacerbation.  Assessment & Plan:    Acute on chronic combined systolic and diastolic congestive heart failure exacerbation Patient presenting with progressive shortness of breath associated with worsening lower extremity edema.  Patient is afebrile, elevated WBC count in the setting of follicular lymphoma.  Procalcitonin 0.11, not indicative of an active infectious process.  BNP elevated 890.0 with chest x-ray with cardiomegaly and mild diffuse bilateral interstitial opacities consistent with pulmonary edema.  TTE with LVEF 45-50%, LV decreased function with regional wall motion normalities,  grade 2 diastolic dysfunction, LV dilated, LA severely dilated, RA moderately dilated, moderate AAS, IVC normal, aortic root dilation of 43 mm. -- Cardiology following, appreciate assistance -- net negative 1.7L past 24h and net negative 5.0L since admission -- weight 106.6>104.4kg -- Lasix 40 mg IV q12h -- Entresto 49-51 mg p.o. twice daily -- Spironolactone 12.5 mg p.o. daily -- Strict I's and O's and daily weights -- Monitor on telemetry -- BMP every 12 hours  Torsades  While having echocardiogram performed on 3/17, patient was noted to go into torsades at 1009am that was nonsustained followed by additional episode at 1030 am lasting roughly 1 minute that self resolved.  Patient without complaints other than feeling "warm".  Discussed with cardiology at [facility], Dr Margaretann Loveless who also discussed with EP Dr.Meallor; with recommendation of discontinuing Tikosyn. -- Cardiology following, appreciate assistance --Tikosyn discontinued -- Maintain K > 4.0 and Mag >2.0 -- Continue close monitoring on telemetry -- Patient declines transfer to [facility] at this time  Paroxysmal atrial fibrillation Essential hypertension -- Tikosyn discontinued as above -- Entresto 49-51 mg p.o. twice daily -- Spironolactone 12.5 mg p.o. daily -- Lasix as above  Hypokalemia Hypomagnesemia Potassium 3.2, magnesium 2.2 this morning.  Continue aggressive repletion of potassium. -- Maintain K > 4.0 and Mag >2.0 -- BMP every 12 hours  Type 2 diabetes mellitus On metformin and glipizide outpatient. -- Check hemoglobin A1c -- Sensitive SSI for coverage -- CBGs before every meal/at bedtime  Stage III high-grade follicular lymphoma Follows with medical oncology, Dr. Delton Coombes outpatient.  Most recent chemotherapy 02/04/2023, tolerating. --Continue allopurinol 300 mg p.o. daily --Continue outpatient follow-up with oncology  HLD -- Atorvastatin 40 mg p.o. daily  Aortic stenosis, moderate  TTE 09/09/2022  with moderate aortic valve stenosis with a valve area 1.48 cm. -- Updated TTE: Pending -- Outpatient follow ip with cardiology  Obesity Body mass index is 32.1 kg/m.  Discussed with patient needs for aggressive lifestyle changes/weight loss as this complicates all facets of care.  Outpatient follow-up with PCP.      DVT prophylaxis: SCDs Start: 02/14/23 2153 apixaban (ELIQUIS) tablet 5 mg    Code Status: Full Code Family Communication: No family present at bedside this morning  Disposition Plan:  Level of care: Stepdown Status is: Inpatient Remains inpatient appropriate because: IV diuresis, awaiting final recommendations/sign off from cardiology    Consultants:  Cardiology  Procedures:  TTE:   Antimicrobials:  Azithromycin 3/16 - 3/16 Ceftriaxone 3/16 - 3/16   Subjective: Patient seen examined bedside, resting comfortably.  Sitting at edge of bed.  Eating breakfast.  States "I would like to go home".  Discussed with patient significant arrhythmia yesterday needs further monitoring and cardiology evaluation today.  Also continues on IV diuresis for volume overload and needs close monitoring of electrolytes.  Declines transfer [facility] for [facility] evaluation.  Seen by cardiology this morning with discontinuation of irbesartan in favor of Entresto given echo findings of slightly reduced EF.  No other specific questions or concerns at this time.  Denies headache, no dizziness, no chest pain, palpitations, no abdominal pain, no fever/chills/night sweats, no nausea/vomiting/diarrhea, no focal weakness, no cough/congestion, no paresthesias.  No acute events overnight per nursing staff.  Objective: Vitals:   02/16/23 0700 02/16/23 0748 02/16/23 0800 02/16/23 1123  BP: (!) 173/86  (!) 153/84   Pulse: 87 97 (!) 129 85  Resp: (!) 26 20 (!) 23 (!) 21  Temp:  (!) 97.5 F (36.4 C)  (!) 97.4 F (36.3 C)  TempSrc:  Oral  Oral  SpO2: 100% 95% 100% 98%  Weight:      Height:         Intake/Output Summary (Last 24 hours) at 02/16/2023 1150 Last data filed at 02/16/2023 0800 Gross per 24 hour  Intake 1180 ml  Output 3045 ml  Net -1865 ml   Filed Weights   02/14/23 1801 02/16/23 0412  Weight: 106.6 kg 104.4 kg    Examination:  Physical Exam: GEN: NAD, alert and oriented x 3, obese, chronically ill in appearance HEENT: NCAT, PERRL, EOMI, sclera clear, MMM, poor dentition PULM: Diminished breath sounds bilateral bases with mild crackles, no wheezing, normal respiratory effort without accessory muscle use, on room air with SpO2 96% at rest. CV: RRR w/o M/G/R GI: abd soft, NTND, NABS, no R/G/M MSK: 1+ peripheral edema bilateral lower extremities up to knee, muscle strength globally intact 5/5 bilateral upper/lower extremities NEURO: CN II-XII intact, no focal deficits, sensation to light touch intact PSYCH: normal mood/affect Integumentary: No concerning rashes/lesions/wounds noted on exposed skin surfaces    Data Reviewed: I have personally reviewed following labs and imaging studies  CBC: Recent Labs  Lab 02/14/23 1835 02/15/23 0440 02/16/23 0036  WBC 25.1* 22.9* 19.6*  NEUTROABS 20.5*  --  15.5*  HGB 11.4* 11.7* 12.4*  HCT 34.4* 35.7* 37.0*  MCV 91.5 91.3 89.8  PLT 156 171 [id number]   Basic Metabolic Panel: Recent Labs  Lab 02/14/23 1835 02/15/23 0440 02/15/23 1036 02/16/23 0036  NA 137 141 138 135  K 3.3* 3.7 3.7 3.2*  CL 103 105 100 99  CO2 24 26 26 26   GLUCOSE 192* 161* 265* 168*  BUN 11 11 12  12  CREATININE 1.15 1.05 1.06 1.07  CALCIUM 7.7* 8.3* 8.4* 8.1*  MG  --  1.5* 2.2 2.2  PHOS  --   --   --  3.0   GFR: Estimated Creatinine Clearance: 68.8 mL/min (by C-G formula based on SCr of 1.07 mg/dL). Liver Function Tests: Recent Labs  Lab 02/14/23 1835 02/16/23 0036  AST 25 20  ALT 24 24  ALKPHOS 132* 130*  BILITOT 0.5 0.9  PROT 6.7 6.9  ALBUMIN 3.4* 3.5   No results for input(s): "LIPASE", "AMYLASE" in the last 168 hours. No  results for input(s): "AMMONIA" in the last 168 hours. Coagulation Profile: Recent Labs  Lab 02/14/23 2015  INR 1.1   Cardiac Enzymes: No results for input(s): "CKTOTAL", "CKMB", "CKMBINDEX", "TROPONINI" in the last 168 hours. BNP (last 3 results) No results for input(s): "PROBNP" in the last 8760 hours. HbA1C: No results for input(s): "HGBA1C" in the last 72 hours. CBG: Recent Labs  Lab 02/15/23 1139 02/15/23 1643 02/15/23 2049 02/16/23 0749 02/16/23 1121  GLUCAP 220* 163* 179* 183* 270*   Lipid Profile: No results for input(s): "CHOL", "HDL", "LDLCALC", "TRIG", "CHOLHDL", "LDLDIRECT" in the last 72 hours. Thyroid Function Tests: No results for input(s): "TSH", "T4TOTAL", "FREET4", "T3FREE", "THYROIDAB" in the last 72 hours. Anemia Panel: No results for input(s): "VITAMINB12", "FOLATE", "FERRITIN", "TIBC", "IRON", "RETICCTPCT" in the last 72 hours. Sepsis Labs: Recent Labs  Lab 02/14/23 2015 02/14/23 2221 02/15/23 0440  PROCALCITON  --   --  0.11  LATICACIDVEN 2.1* 1.6  --     Recent Results (from the past 240 hour(s))  Resp panel by RT-PCR (RSV, Flu A&B, Covid) Anterior Nasal Swab     Status: None   Collection Time: 02/14/23  8:05 PM   Specimen: Anterior Nasal Swab  Result Value Ref Range Status   SARS Coronavirus 2 by RT PCR NEGATIVE NEGATIVE Final    Comment: (NOTE) SARS-CoV-2 target nucleic acids are NOT DETECTED.  The SARS-CoV-2 RNA is generally detectable in upper respiratory specimens during the acute phase of infection. The lowest concentration of SARS-CoV-2 viral copies this assay can detect is 138 copies/mL. A negative result does not preclude SARS-Cov-2 infection and should not be used as the sole basis for treatment or other patient management decisions. A negative result may occur with  improper specimen collection/handling, submission of specimen other than nasopharyngeal swab, presence of viral mutation(s) within the areas targeted by this  assay, and inadequate number of viral copies(<138 copies/mL). A negative result must be combined with clinical observations, patient history, and epidemiological information. The expected result is Negative.  Fact Sheet for Patients:  EntrepreneurPulse.com.au  Fact Sheet for Healthcare Providers:  IncredibleEmployment.be  This test is no t yet approved or cleared by the [place] FDA and  has been authorized for detection and/or diagnosis of SARS-CoV-2 by FDA under an Emergency Use Authorization (EUA). This EUA will remain  in effect (meaning this test can be used) for the duration of the COVID-19 declaration under Section 564(b)(1) of the Act, 21 U.S.C.section 360bbb-3(b)(1), unless the authorization is terminated  or revoked sooner.       Influenza A by PCR NEGATIVE NEGATIVE Final   Influenza B by PCR NEGATIVE NEGATIVE Final    Comment: (NOTE) The Xpert Xpress SARS-CoV-2/FLU/RSV plus assay is intended as an aid in the diagnosis of influenza from Nasopharyngeal swab specimens and should not be used as a sole basis for treatment. Nasal washings and aspirates are unacceptable for Xpert Xpress  SARS-CoV-2/FLU/RSV testing.  Fact Sheet for Patients: EntrepreneurPulse.com.au  Fact Sheet for Healthcare Providers: IncredibleEmployment.be  This test is not yet approved or cleared by the [place] FDA and has been authorized for detection and/or diagnosis of SARS-CoV-2 by FDA under an Emergency Use Authorization (EUA). This EUA will remain in effect (meaning this test can be used) for the duration of the COVID-19 declaration under Section 564(b)(1) of the Act, 21 U.S.C. section 360bbb-3(b)(1), unless the authorization is terminated or revoked.     Resp Syncytial Virus by PCR NEGATIVE NEGATIVE Final    Comment: (NOTE) Fact Sheet for Patients: EntrepreneurPulse.com.au  Fact Sheet for  Healthcare Providers: IncredibleEmployment.be  This test is not yet approved or cleared by the [place] FDA and has been authorized for detection and/or diagnosis of SARS-CoV-2 by FDA under an Emergency Use Authorization (EUA). This EUA will remain in effect (meaning this test can be used) for the duration of the COVID-19 declaration under Section 564(b)(1) of the Act, 21 U.S.C. section 360bbb-3(b)(1), unless the authorization is terminated or revoked.  Performed at [facility], [address] [place], [place] [address]   Blood Culture (routine x 2)     Status: None (Preliminary result)   Collection Time: 02/14/23  8:15 PM   Specimen: Left Antecubital; Blood  Result Value Ref Range Status   Specimen Description   Final    LEFT ANTECUBITAL BOTTLES DRAWN AEROBIC AND ANAEROBIC Performed at [facility], [address], [place], [place] [address]    Special Requests   Final    Blood Culture adequate volume Performed at [facility], [address], [place], [place] [address]    Culture   Final    NO GROWTH 2 DAYS Performed at [facility], [place] [address] [place], [place] [address]    Report Status PENDING  Incomplete  Blood Culture (routine x 2)     Status: None (Preliminary result)   Collection Time: 02/14/23  8:24 PM   Specimen: BLOOD RIGHT HAND  Result Value Ref Range Status   Specimen Description   Final    BLOOD RIGHT HAND BOTTLES DRAWN AEROBIC AND ANAEROBIC Performed at [facility], [address], [place], [place] [address]    Special Requests   Final    Blood Culture adequate volume Performed at [facility], [address] [place], [place] [address]    Culture   Final    NO GROWTH 2 DAYS Performed at [facility], [place] [address] [place], [place] [address]    Report Status PENDING  Incomplete  MRSA Next Gen by PCR, Nasal     Status: None   Collection Time: 02/15/23 11:50 AM   Specimen: Nasal Mucosa; Nasal Swab  Result Value Ref  Range Status   MRSA by PCR Next Gen NOT DETECTED NOT DETECTED Final    Comment: (NOTE) The GeneXpert MRSA Assay (FDA approved for NASAL specimens only), is one component of a comprehensive MRSA colonization surveillance program. It is not intended to diagnose MRSA infection nor to guide or monitor treatment for MRSA infections. Test performance is not FDA approved in patients less than 29 years old. Performed at [facility], [address] [place], [place] [address]          Radiology Studies: ECHOCARDIOGRAM COMPLETE  Result Date: 02/15/2023    ECHOCARDIOGRAM REPORT   Patient Name:   AVID SOLLIE Date of Exam: 02/15/2023 Medical Rec #:  [id number]      Height:  71.0 in Accession #:    [id number]     Weight:       235.0 lb Date of Birth:  [birth year]       BSA:          2.258 m Patient Age:    75 years       BP:           171/69 mmHg Patient Gender: M              HR:           67 bpm. Exam Location:  [facility] Procedure: 2D Echo, Cardiac Doppler and Color Doppler Indications:    Dyspnea R06.00  History:        Patient has prior history of Echocardiogram examinations, most                 recent 09/19/2022. CHF and Cardiomyopathy, CAD and Previous                 Myocardial Infarction, Prior CABG, Arrythmias:Atrial                 Fibrillation and PVC; Risk Factors:Hypertension, Dyslipidemia                 and Diabetes. Hx of (HFimpEF) heart failure with improved EF.  Sonographer:    Alvino Chapel RCS Referring Phys: [id number] [place]  1. Left ventricular ejection fraction, by estimation, is 45 to 50%. The left ventricle has mildly decreased function. The left ventricle demonstrates regional wall motion abnormalities (see scoring diagram/findings for description). The left ventricular  internal cavity size was moderately dilated. Left ventricular diastolic parameters are consistent with Grade II diastolic dysfunction (pseudonormalization).  2. Right ventricular  systolic function is normal. The right ventricular size is mildly enlarged. There is mildly elevated pulmonary artery systolic pressure.  3. Left atrial size was severely dilated.  4. Right atrial size was mildly dilated.  5. The mitral valve is normal in structure. No evidence of mitral valve regurgitation. No evidence of mitral stenosis.  6. The aortic valve is calcified. There is moderate calcification of the aortic valve. There is moderate thickening of the aortic valve. Aortic valve regurgitation is not visualized. Moderate aortic valve stenosis. Aortic valve mean gradient measures 19.0 mmHg. Aortic valve Vmax measures 2.97 m/s.  7. Aortic dilatation noted. There is mild dilatation of the aortic root, measuring 43 mm.  8. The inferior vena cava is normal in size with greater than 50% respiratory variability, suggesting right atrial pressure of 3 mmHg. Comparison(s): No significant change from prior study. Prior images reviewed side by side. FINDINGS  Left Ventricle: Left ventricular ejection fraction, by estimation, is 45 to 50%. The left ventricle has mildly decreased function. The left ventricle demonstrates regional wall motion abnormalities. The left ventricular internal cavity size was moderately dilated. There is no left ventricular hypertrophy. Left ventricular diastolic parameters are consistent with Grade II diastolic dysfunction (pseudonormalization).  LV Wall Scoring: The basal inferior segment and basal inferoseptal segment are akinetic. Right Ventricle: The right ventricular size is mildly enlarged. No increase in right ventricular wall thickness. Right ventricular systolic function is normal. There is mildly elevated pulmonary artery systolic pressure. The tricuspid regurgitant velocity is 3.20 m/s, and with an assumed right atrial pressure of 3 mmHg, the estimated right ventricular systolic pressure is 123XX123 mmHg. Left Atrium: Left atrial size was severely dilated. Right Atrium: Right atrial size  was mildly dilated. Pericardium: There  is no evidence of pericardial effusion. Mitral Valve: The mitral valve is normal in structure. No evidence of mitral valve regurgitation. No evidence of mitral valve stenosis. Tricuspid Valve: The tricuspid valve is normal in structure. Tricuspid valve regurgitation is mild . No evidence of tricuspid stenosis. Aortic Valve: The aortic valve is calcified. There is moderate calcification of the aortic valve. There is moderate thickening of the aortic valve. Aortic valve regurgitation is not visualized. Moderate aortic stenosis is present. Aortic valve mean gradient measures 19.0 mmHg. Aortic valve peak gradient measures 35.3 mmHg. Aortic valve area, by VTI measures 1.91 cm. Pulmonic Valve: The pulmonic valve was normal in structure. Pulmonic valve regurgitation is mild. No evidence of pulmonic stenosis. Aorta: Aortic dilatation noted. There is mild dilatation of the aortic root, measuring 43 mm. Venous: The inferior vena cava is normal in size with greater than 50% respiratory variability, suggesting right atrial pressure of 3 mmHg. IAS/Shunts: No atrial level shunt detected by color flow Doppler.  LEFT VENTRICLE PLAX 2D LVIDd:         6.40 cm   Diastology LVIDs:         4.70 cm   LV e' medial:    7.07 cm/s LV PW:         1.40 cm   LV E/e' medial:  20.2 LV IVS:        1.10 cm   LV e' lateral:   10.80 cm/s LVOT diam:     2.50 cm   LV E/e' lateral: 13.2 LV SV:         130 LV SV Index:   57 LVOT Area:     4.91 cm  RIGHT VENTRICLE RV S prime:     12.30 cm/s TAPSE (M-mode): 2.1 cm LEFT ATRIUM              Index        RIGHT ATRIUM           Index LA diam:        5.70 cm  2.52 cm/m   RA Area:     26.10 cm LA Vol (A2C):   96.6 ml  42.78 ml/m  RA Volume:   80.30 ml  35.56 ml/m LA Vol (A4C):   124.0 ml 54.91 ml/m LA Biplane Vol: 115.0 ml 50.93 ml/m  AORTIC VALVE AV Area (Vmax):    1.69 cm AV Area (Vmean):   1.69 cm AV Area (VTI):     1.91 cm AV Vmax:           297.00 cm/s AV  Vmean:          201.000 cm/s AV VTI:            0.680 m AV Peak Grad:      35.3 mmHg AV Mean Grad:      19.0 mmHg LVOT Vmax:         102.00 cm/s LVOT Vmean:        69.100 cm/s LVOT VTI:          0.264 m LVOT/AV VTI ratio: 0.39  AORTA Ao Root diam: 4.30 cm MITRAL VALVE                TRICUSPID VALVE MV Area (PHT): 5.27 cm     TR Peak grad:   41.0 mmHg MV Decel Time: 144 msec     TR Vmax:        320.00 cm/s MV E velocity: 143.00 cm/s MV A velocity: 73.80  cm/s   SHUNTS MV E/A ratio:  1.94         Systemic VTI:  0.26 m                             Systemic Diam: 2.50 cm Candee Furbish MD Electronically signed by Candee Furbish MD Signature Date/Time: 02/15/2023/12:29:11 PM    Final    DG Chest 1 View  Result Date: 02/15/2023 CLINICAL DATA:  Dyspnea, chest pain EXAM: CHEST  1 VIEW COMPARISON:  02/14/2023 FINDINGS: The lungs are symmetrically well expanded. Superimposed mild interstitial pulmonary infiltrates appears improved in the interval, suggesting improving pulmonary edema given its relatively rapid resolution. No pneumothorax or pleural effusion. Right internal jugular chest port tip noted in the expected innominate vein. Coronary artery bypass grafting has been performed. Mild cardiomegaly is stable. IMPRESSION: 1. Interval improvement in pulmonary edema. 2. Stable mild cardiomegaly. Electronically Signed   By: Fidela Salisbury M.D.   On: 02/15/2023 01:10   DG Chest Port 1 View  Result Date: 02/14/2023 CLINICAL DATA:  Shortness of breath, chest pain EXAM: PORTABLE CHEST 1 VIEW COMPARISON:  Chest radiograph dated 01/06/2023. FINDINGS: The heart is enlarged. Vascular calcifications are seen in the aortic arch. There is mild diffuse bilateral interstitial opacities. Trace pleural effusions are difficult to exclude. There is no pneumothorax. A right internal jugular central venous port catheter tip overlies the superior vena cava or brachiocephalic vein. Degenerative changes are seen in the spine. IMPRESSION:  Cardiomegaly with mild diffuse bilateral interstitial opacities, which may reflect pulmonary edema or atypical infection. Electronically Signed   By: Zerita Boers M.D.   On: 02/14/2023 19:03        Scheduled Meds:  allopurinol  300 mg Oral Daily   apixaban  5 mg Oral BID   atorvastatin  40 mg Oral QHS   Chlorhexidine Gluconate Cloth  6 each Topical Daily   furosemide  40 mg Intravenous Q12H   insulin aspart  0-5 Units Subcutaneous QHS   insulin aspart  0-6 Units Subcutaneous TID WC   loratadine  10 mg Oral Daily   pantoprazole  40 mg Oral Daily   potassium chloride  40 mEq Oral Q3H   sacubitril-valsartan  1 tablet Oral BID   spironolactone  12.5 mg Oral Daily   Continuous Infusions:  potassium chloride 10 mEq (02/16/23 1105)     LOS: 2 days    Time spent: 51 minutes spent on chart review, discussion with nursing staff, consultants, updating family and interview/physical exam; more than 50% of that time was spent in counseling and/or coordination of care.    Jesaiah Fabiano J [place] DO [facility] Available via Epic secure chat 7am-7pm After these hours, please refer to coverage provider listed on [email or web address] 02/16/2023, 11:50 AM

## 2023-02-16 NOTE — TOC Initial Note (Signed)
Transition of Care [facility]) - Initial/Assessment Note    Patient Details  Name: Alejandro [name] MRN: [id number] Date of Birth: [birth year]  [facility]) CM/SW Contact:    Shade Flood, LCSW Phone Number: 02/16/2023, 3:00 PM  Clinical Narrative:                  Pt admitted from home. He is considered high risk for readmission. Pt is independent in ADLs at baseline. He has a PCP and cardiologist. Pt able to get to appointments and obtain medications.  Anticipating pt may benefit from [facility] at dc. Will follow up closer to dc to assist.  Expected Discharge Plan: [place] Barriers to Discharge: Continued Medical Work up   Patient Goals and CMS Choice Patient states their goals for this hospitalization and ongoing recovery are:: go home          Expected Discharge Plan and Services In-house Referral: Clinical Social Work     Living arrangements for the past 2 months: Single Family Home                                      Prior Living Arrangements/Services Living arrangements for the past 2 months: Single Family Home Lives with:: Self Patient language and need for interpreter reviewed:: Yes Do you feel safe going back to the place where you live?: Yes      Need for Family Participation in Patient Care: No (Comment)     Criminal Activity/Legal Involvement Pertinent to Current Situation/Hospitalization: No - Comment as needed  Activities of Daily Living      Permission Sought/Granted                  Emotional Assessment Appearance:: Appears stated age Attitude/Demeanor/Rapport: Engaged Affect (typically observed): Pleasant Orientation: : Oriented to Self, Oriented to Place, Oriented to  Time, Oriented to Situation Alcohol / Substance Use: Not Applicable Psych Involvement: No (comment)  Admission diagnosis:  Heart failure ([place]) [I50.9] Community acquired pneumonia, unspecified laterality [J18.9] Acute congestive heart  failure, unspecified heart failure type ([place]) [I50.9] Patient Active Problem List   Diagnosis Date Noted   Acute on chronic diastolic CHF (congestive heart failure) ([place]) 02/14/2023   Paroxysmal atrial fibrillation ([place]) 0000000   Follicular lymphoma of lymph nodes of inguinal region ([place]) 0000000   Follicular lymphoma grade 3a ([place]) 12/23/2022   Inguinal lymphadenopathy 12/15/2022   Mediastinal adenopathy 11/17/2022   Generalized lymphadenopathy 09/16/2022   Headache 10/28/2021   Upper airway cough syndrome 10/28/2021   Hypothermia    Uncontrolled type 2 diabetes mellitus with hyperglycemia, without long-term current use of insulin ([place]) 07/13/2021   (HFimpEF) heart failure with improved EF 07/13/2021   SIRS (systemic inflammatory response syndrome) ([place]) 0000000   Acute metabolic encephalopathy 0000000   S/P CABG x 4 05/11/2018   CKD (chronic kidney disease), stage III ([place]) 05/07/2018   Obesity 05/07/2018   Aortic stenosis 05/07/2018   Non-ST elevation (NSTEMI) myocardial infarction ([place]) 05/07/2018   CAD (coronary artery disease) 05/07/2018   DOE (dyspnea on exertion)    Leukocytosis 05/02/2018   Flash pulmonary edema ([place]) 05/02/2018   Diabetic neuropathy with neurologic complication ([place]) [id number]   Spinal stenosis of lumbar region 03/29/2013   Melena 03/02/2013   GERD (gastroesophageal reflux disease) 0000000   Helicobacter pylori (H. pylori) 09/02/2012   Essential hypertension 01/08/2012   Diabetes mellitus ([place]) 01/08/2012  Hyperlipidemia    PCP:  Pablo Lawrence, NP Pharmacy:   [place], [address] [place] [address] [place] [address] Phone: [phone] Fax: [phone]     Social Determinants of Health (SDOH) Social History: SDOH Screenings   Depression (PHQ2-9): Low Risk  (11/12/2018)  Tobacco Use: Low Risk  (02/14/2023)   SDOH Interventions:      Readmission Risk Interventions    02/16/2023    2:59 PM  Readmission Risk Prevention Plan  Transportation Screening Complete  HRI or [place] Complete  Social Work Consult for [place] Planning/Counseling Complete  Palliative Care Screening Not Applicable  Medication Review Press photographer) Complete

## 2023-02-17 DIAGNOSIS — I5033 Acute on chronic diastolic (congestive) heart failure: Secondary | ICD-10-CM | POA: Diagnosis not present

## 2023-02-17 LAB — BASIC METABOLIC PANEL
Anion gap: 9 (ref 5–15)
BUN: 16 mg/dL (ref 8–23)
CO2: 24 mmol/L (ref 22–32)
Calcium: 8.2 mg/dL — ABNORMAL LOW (ref 8.9–10.3)
Chloride: 102 mmol/L (ref 98–111)
Creatinine, Ser: 0.99 mg/dL (ref 0.61–1.24)
GFR, Estimated: >60 mL/min (ref >60)
Glucose, Bld: 192 mg/dL — ABNORMAL HIGH (ref 70–99)
Potassium: 3.8 mmol/L (ref 3.5–5.1)
Sodium: 135 mmol/L (ref 135–145)

## 2023-02-17 LAB — GLUCOSE, CAPILLARY
Glucose-Capillary: 200 mg/dL — ABNORMAL HIGH (ref 70–99)
Glucose-Capillary: 292 mg/dL — ABNORMAL HIGH (ref 70–99)

## 2023-02-17 LAB — MAGNESIUM: Magnesium: 2.1 mg/dL (ref 1.7–2.4)

## 2023-02-17 MED ORDER — SACUBITRIL-VALSARTAN 49-51 MG PO TABS: 1.0000 | ORAL_TABLET | Freq: Two times a day (BID) | ORAL | 2 refills | Status: AC

## 2023-02-17 MED ORDER — POTASSIUM CHLORIDE CRYS ER 20 MEQ PO TBCR
20.0000 meq | EXTENDED_RELEASE_TABLET | Freq: Once | ORAL | Status: AC
  Administered 2023-02-17: 20 meq via ORAL
  Filled 2023-02-17: qty 1

## 2023-02-17 MED ORDER — FUROSEMIDE 80 MG PO TABS: 80.0000 mg | ORAL_TABLET | Freq: Every day | ORAL | 2 refills | Status: DC

## 2023-02-17 MED ORDER — POTASSIUM CHLORIDE CRYS ER 20 MEQ PO TBCR: 20.0000 meq | EXTENDED_RELEASE_TABLET | Freq: Every day | ORAL | 2 refills | Status: DC

## 2023-02-17 NOTE — Progress Notes (Signed)
Patient's case discussed with Dr. Dellia Cloud and [employer] representative [place]. Insurance will not cover a lifevest unless it's documented that he had sustained ventricular tachycardia or has prolonged QT syndrome. Dr. Dellia Cloud reviewed his rhythm strips and didn't feel that he had either of these. To be sent home on higher dose Kdur 20 meq bid and has appt to see Dr. Lovena Le next week 02/24/23.

## 2023-02-17 NOTE — Discharge Summary (Addendum)
Physician Discharge Summary  Alejandro [name] [id number] DOB: 02/11/1941 DOA: 02/14/2023  PCP: Pablo Lawrence, NP  Admit date: 02/14/2023 Discharge date: 02/17/2023  Admitted From: Home Disposition: Home  Recommendations for Outpatient Follow-up:  Follow up with PCP in 1-2 weeks Follow-up with cardiology, Dr. Lovena Le as scheduled on 02/24/2023 Follow-up with medical oncology, Dr. Delton Coombes as scheduled on 03/05/2023 Started on Entresto and furosemide 80 mg p.o. daily per cardiology recommendations May need to consider decrease fluid resuscitation during chemotherapy sessions as possible playing a role in his volume overload leading to this hospitalization  Home Health: None Equipment/Devices: LifeVest  Discharge Condition: Stable  CODE STATUS: Full code Diet recommendation: Heart healthy/consistent carb regular diet  History of present illness:  Alejandro [name] is a 82 y.o. male with past medical history significant for paroxysmal atrial fibrillation, aortic stenosis, CAD, CKD stage IIIb, chronic diastolic congestive heart failure, HTN, obesity, stage III high-grade follicular lymphoma who presented to [facility] ED on 3/16 with complaints of shortness of breath.  Additionally he reports increasing lower extremity edema over the last few days.  Denies chest pain, no cough, no fever/chills.   Patient with recent chemotherapy treatment on 02/04/2023 with good toleration.   In the ED, temperature 98.8 F, HR 97, RR 31, BP 180/89, SpO2 92% on room air.  WBC 25.1, hemoglobin 11.4, platelets 156.  Sodium 137, potassium 3.3, chloride 103, CO2 24, glucose 192, BUN 11, creatinine 1.15.  AST 25, ALT 24, total bilirubin 0.5.  BNP 890.0.  High sensitive troponin 19.  Procalcitonin 0.11.  COVID/RSV/influenza PCR negative.  Chest x-ray with cardiomegaly with mild diffuse bilateral interstitial opacities consistent with pulmonary edema.  EDP started azithromycin, ceftriaxone, given 1 dose of Lasix  40 mg IV.  TRH consulted for admission for further evaluation management for concern of acute on chronic diastolic congestive heart failure exacerbation.  Hospital course:  Acute on chronic combined systolic and diastolic congestive heart failure exacerbation Patient presenting with progressive shortness of breath associated with worsening lower extremity edema.  Patient is afebrile, elevated WBC count in the setting of follicular lymphoma.  Procalcitonin 0.11, not indicative of an active infectious process.  BNP elevated 890.0 with chest x-ray with cardiomegaly and mild diffuse bilateral interstitial opacities consistent with pulmonary edema.  TTE with LVEF 45-50%, LV decreased function with regional wall motion normalities, grade 2 diastolic dysfunction, LV dilated, LA severely dilated, RA moderately dilated, moderate AAS, IVC normal, aortic root dilation of 43 mm.  Cardiology was consulted and followed during hospital course.  Patient was started on IV Lasix with good diuresis with net -5.5 L during the hospitalization.  Patient was started on Entresto 49-51 mg p.o. twice daily and will discharge on Lasix 80 mg p.o. daily.  Continue home spironolactone 12.5 mg p.o. daily.  Outpatient follow-up with cardiology upcoming on 02/24/2023.  Cardiology recommended outpatient stress testing once acute issues resolved.  Recommend monitoring of daily weights.  May need to decrease IV fluid hydration during chemotherapy sessions as possible contributing factor to his volume overload.   Torsades/polymorphic VT Prolonged Qtc, now resolved While having echocardiogram performed on 3/17, patient was noted to go into torsades at 1009am that was nonsustained followed by additional episode at 1030 am lasting roughly 1 minute that self resolved.  Patient without complaints other than feeling "warm".  Discussed with cardiology at [facility], Dr Margaretann Loveless who also discussed with EP Dr.Meallor; with recommendation of discontinuing  Tikosyn.  Cardiology was consulted and followed during hospital  course.  Patient declined transfer to [facility]  Has outpatient electrophysiology appointment set up with Dr. Lovena Le on 02/24/2023.  Will discharge with LifeVest in place for sustained torsades/polymorphic ventricular tachycardia lasting greater than 30 seconds noted during his hospitalization in the setting of prolonged QTc.   Paroxysmal atrial fibrillation Essential hypertension Tikosyn discontinued as above, started on Entresto 49-51 mg p.o. twice daily, and furosemide 80 mg p.o. daily.  Continue home Spironolactone 12.5 mg p.o. daily   Hypokalemia Hypomagnesemia Repleted during hospitalization.  Goal to maintain K > 4.0 and Mag >2.0.  Will discharge on potassium 20 mill equivalents p.o. daily.  Recommend outpatient follow-up with EP, repeat BMP and magnesium level in 1 week.   Type 2 diabetes mellitus On metformin and glipizide outpatient.  Hemoglobin A1c 8.3, not optimally controlled.  Outpatient follow-up with PCP.   Stage III high-grade follicular lymphoma Follows with medical oncology, Dr. Delton Coombes outpatient.  Most recent chemotherapy 02/04/2023, tolerating. Continue allopurinol 300 mg p.o. daily.  May need decreased IV fluids during chemotherapy sessions given volume overload leading to CHF exacerbation this admission. Continue outpatient follow-up with oncology   HLD Atorvastatin 40 mg p.o. daily   Aortic stenosis, moderate Aortic root aneurysm Outpatient follow-up with cardiology   Obesity Body mass index is 32.1 kg/m.  Discussed with patient needs for aggressive lifestyle changes/weight loss as this complicates all facets of care.  Outpatient follow-up with PCP.  Discharge Diagnoses:  Principal Problem:   Acute on chronic diastolic CHF (congestive heart failure) (HCC) Active Problems:   Essential hypertension   Paroxysmal atrial fibrillation (HCC)   CKD (chronic kidney disease), stage III (HCC)   CAD  (coronary artery disease)   Diabetes mellitus (HCC)   Hyperlipidemia   GERD (gastroesophageal reflux disease)   Follicular lymphoma grade 3a ([employer])   Obesity    Discharge Instructions  Discharge Instructions     Diet - low sodium heart healthy   Complete by: As directed    Increase activity slowly   Complete by: As directed       Allergies as of 02/17/2023   No Known Allergies      Medication List     STOP taking these medications    BENDAMUSTINE HCL IV   dofetilide 500 MCG capsule Commonly known as: TIKOSYN   lidocaine-prilocaine cream Commonly known as: EMLA   RITUXAN IV   valsartan 40 MG tablet Commonly known as: Diovan       TAKE these medications    acetaminophen 500 MG tablet Commonly known as: TYLENOL Take 1,000 mg by mouth every 6 (six) hours as needed for moderate pain or mild pain.   albuterol 108 (90 Base) MCG/ACT inhaler Commonly known as: VENTOLIN HFA Inhale 2 puffs into the lungs every 6 (six) hours as needed for wheezing or shortness of breath.   allopurinol 300 MG tablet Commonly known as: ZYLOPRIM Take 1 tablet (300 mg total) by mouth daily.   apixaban 5 MG Tabs tablet Commonly known as: Eliquis Take 1 tablet (5 mg total) by mouth 2 (two) times daily.   atorvastatin 40 MG tablet Commonly known as: LIPITOR Take 40 mg by mouth at bedtime.   cyanocobalamin 1000 MCG tablet Commonly known as: VITAMIN B12 Take 1,000 mcg by mouth daily.   cyanocobalamin 1000 MCG/ML injection Commonly known as: VITAMIN B12 Inject 1,000 mcg into the skin every 30 (thirty) days.   docusate sodium 100 MG capsule Commonly known as: Colace Take 1 capsule (100 mg total) by  mouth 2 (two) times daily. What changed:  when to take this reasons to take this   famotidine 20 MG tablet Commonly known as: Pepcid Take 1 tablet (20 mg total) by mouth 2 (two) times daily.   ferrous sulfate 325 (65 FE) MG EC tablet Take 1 tablet (325 mg total) by mouth daily  with breakfast.   fluticasone 50 MCG/ACT nasal spray Commonly known as: FLONASE Place 1 spray into both nostrils daily.   furosemide 80 MG tablet Commonly known as: Lasix Take 1 tablet (80 mg total) by mouth daily.   glipiZIDE 10 MG 24 hr tablet Commonly known as: GLUCOTROL XL Take 10 mg by mouth daily.   iVIZIA Dry Eyes 0.5 % Soln Generic drug: Povidone (PF) Place 1 drop into both eyes daily as needed (dry eyes).   loratadine 10 MG tablet Commonly known as: CLARITIN Take 10 mg by mouth daily.   Melatonin 10 MG Caps Take 30 mg by mouth at bedtime as needed (sleep).   metFORMIN 750 MG 24 hr tablet Commonly known as: GLUCOPHAGE-XR Take 1 tablet by mouth 2 (two) times daily with a meal.   oxyCODONE 5 MG immediate release tablet Commonly known as: Roxicodone Take 1 tablet (5 mg total) by mouth every 6 (six) hours as needed. What changed: reasons to take this   pantoprazole 40 MG tablet Commonly known as: PROTONIX Take 40 mg by mouth daily.   polyethylene glycol 17 g packet Commonly known as: MIRALAX / GLYCOLAX Take 17 g by mouth daily. What changed:  when to take this reasons to take this   potassium chloride 10 MEQ CR capsule Commonly known as: MICRO-K Take 10 mEq by mouth daily.   potassium chloride SA 20 MEQ tablet Commonly known as: KLOR-CON M Take 1 tablet (20 mEq total) by mouth daily.   prochlorperazine 10 MG tablet Commonly known as: COMPAZINE Take 1 tablet (10 mg total) by mouth every 6 (six) hours as needed for nausea or vomiting.   sacubitril-valsartan 49-51 MG Commonly known as: ENTRESTO Take 1 tablet by mouth 2 (two) times daily.   sodium chloride 0.65 % Soln nasal spray Commonly known as: OCEAN Place 1 spray into both nostrils as needed for congestion.   spironolactone 25 MG tablet Commonly known as: ALDACTONE Take 0.5 tablets (12.5 mg total) by mouth daily.               Durable Medical Equipment  (From admission, onward)            Start     Ordered   02/17/23 0946  For home use only DME Vest life vest  Once       Comments: 3 months   02/17/23 0948            Follow-up Information     Pablo Lawrence, NP. Schedule an appointment as soon as possible for a visit in 1 week(s).   Specialty: Adult Health Nurse Practitioner Contact information: [address] [place] [address] [phone]         Evans Lance, MD. Go on 02/24/2023.   Specialty: Cardiology Contact information: [place] [address] [id number]         Derek Jack, MD. Go on 03/05/2023.   Specialty: Hematology Contact information: [place] [address] [phone]                No Known Allergies  Consultations: Cardiology  Procedures/Studies: ECHOCARDIOGRAM COMPLETE  Result Date: 02/15/2023    ECHOCARDIOGRAM REPORT   Patient Name:   DEVARIUS STROMQUIST Date of Exam: 02/15/2023 Medical Rec #:  [id number]      Height:       71.0 in Accession #:    [id number]     Weight:       235.0 lb Date of Birth:  Jun 30, 1941       BSA:          2.258 m Patient Age:    82 years       BP:           171/69 mmHg Patient Gender: M              HR:           67 bpm. Exam Location:  [facility] Procedure: 2D Echo, Cardiac Doppler and Color Doppler Indications:    Dyspnea R06.00  History:        Patient has prior history of Echocardiogram examinations, most                 recent 09/19/2022. CHF and Cardiomyopathy, CAD and Previous                 Myocardial Infarction, Prior CABG, Arrythmias:Atrial                 Fibrillation and PVC; Risk Factors:Hypertension, Dyslipidemia                 and Diabetes. Hx of (HFimpEF) heart failure with improved EF.  Sonographer:    Alvino Chapel RCS Referring Phys: [id number] [place]  1. Left ventricular ejection fraction, by estimation, is 45 to 50%. The left ventricle has mildly decreased function. The left ventricle demonstrates  regional wall motion abnormalities (see scoring diagram/findings for description). The left ventricular  internal cavity size was moderately dilated. Left ventricular diastolic parameters are consistent with Grade II diastolic dysfunction (pseudonormalization).  2. Right ventricular systolic function is normal. The right ventricular size is mildly enlarged. There is mildly elevated pulmonary artery systolic pressure.  3. Left atrial size was severely dilated.  4. Right atrial size was mildly dilated.  5. The mitral valve is normal in structure. No evidence of mitral valve regurgitation. No evidence of mitral stenosis.  6. The aortic valve is calcified. There is moderate calcification of the aortic valve. There is moderate thickening of the aortic valve. Aortic valve regurgitation is not visualized. Moderate aortic valve stenosis. Aortic valve mean gradient measures 19.0 mmHg. Aortic valve Vmax measures 2.97 m/s.  7. Aortic dilatation noted. There is mild dilatation of the aortic root, measuring 43 mm.  8. The inferior vena cava is normal in size with greater than 50% respiratory variability, suggesting right atrial pressure of 3 mmHg. Comparison(s): No significant change from prior study. Prior images reviewed side by side. FINDINGS  Left Ventricle: Left ventricular ejection fraction, by estimation, is 45 to 50%. The left ventricle has mildly decreased function. The left ventricle demonstrates regional wall motion abnormalities. The left ventricular internal cavity size was moderately dilated. There is no left ventricular hypertrophy. Left ventricular diastolic parameters are consistent with Grade II diastolic dysfunction (pseudonormalization).  LV Wall Scoring: The basal inferior segment and basal inferoseptal segment are akinetic. Right Ventricle: The right ventricular size is mildly enlarged. No increase in right ventricular wall thickness. Right ventricular systolic function is normal. There is mildly elevated  pulmonary artery systolic pressure. The tricuspid  regurgitant velocity is 3.20 m/s, and with an assumed right atrial pressure of 3 mmHg, the estimated right ventricular systolic pressure is 123XX123 mmHg. Left Atrium: Left atrial size was severely dilated. Right Atrium: Right atrial size was mildly dilated. Pericardium: There is no evidence of pericardial effusion. Mitral Valve: The mitral valve is normal in structure. No evidence of mitral valve regurgitation. No evidence of mitral valve stenosis. Tricuspid Valve: The tricuspid valve is normal in structure. Tricuspid valve regurgitation is mild . No evidence of tricuspid stenosis. Aortic Valve: The aortic valve is calcified. There is moderate calcification of the aortic valve. There is moderate thickening of the aortic valve. Aortic valve regurgitation is not visualized. Moderate aortic stenosis is present. Aortic valve mean gradient measures 19.0 mmHg. Aortic valve peak gradient measures 35.3 mmHg. Aortic valve area, by VTI measures 1.91 cm. Pulmonic Valve: The pulmonic valve was normal in structure. Pulmonic valve regurgitation is mild. No evidence of pulmonic stenosis. Aorta: Aortic dilatation noted. There is mild dilatation of the aortic root, measuring 43 mm. Venous: The inferior vena cava is normal in size with greater than 50% respiratory variability, suggesting right atrial pressure of 3 mmHg. IAS/Shunts: No atrial level shunt detected by color flow Doppler.  LEFT VENTRICLE PLAX 2D LVIDd:         6.40 cm   Diastology LVIDs:         4.70 cm   LV e' medial:    7.07 cm/s LV PW:         1.40 cm   LV E/e' medial:  20.2 LV IVS:        1.10 cm   LV e' lateral:   10.80 cm/s LVOT diam:     2.50 cm   LV E/e' lateral: 13.2 LV SV:         130 LV SV Index:   57 LVOT Area:     4.91 cm  RIGHT VENTRICLE RV S prime:     12.30 cm/s TAPSE (M-mode): 2.1 cm LEFT ATRIUM              Index        RIGHT ATRIUM           Index LA diam:        5.70 cm  2.52 cm/m   RA Area:     26.10  cm LA Vol (A2C):   96.6 ml  42.78 ml/m  RA Volume:   80.30 ml  35.56 ml/m LA Vol (A4C):   124.0 ml 54.91 ml/m LA Biplane Vol: 115.0 ml 50.93 ml/m  AORTIC VALVE AV Area (Vmax):    1.69 cm AV Area (Vmean):   1.69 cm AV Area (VTI):     1.91 cm AV Vmax:           297.00 cm/s AV Vmean:          201.000 cm/s AV VTI:            0.680 m AV Peak Grad:      35.3 mmHg AV Mean Grad:      19.0 mmHg LVOT Vmax:         102.00 cm/s LVOT Vmean:        69.100 cm/s LVOT VTI:          0.264 m LVOT/AV VTI ratio: 0.39  AORTA Ao Root diam: 4.30 cm MITRAL VALVE                TRICUSPID VALVE MV Area (PHT):  5.27 cm     TR Peak grad:   41.0 mmHg MV Decel Time: 144 msec     TR Vmax:        320.00 cm/s MV E velocity: 143.00 cm/s MV A velocity: 73.80 cm/s   SHUNTS MV E/A ratio:  1.94         Systemic VTI:  0.26 m                             Systemic Diam: 2.50 cm Candee Furbish MD Electronically signed by Candee Furbish MD Signature Date/Time: 02/15/2023/12:29:11 PM    Final    DG Chest 1 View  Result Date: 02/15/2023 CLINICAL DATA:  Dyspnea, chest pain EXAM: CHEST  1 VIEW COMPARISON:  02/14/2023 FINDINGS: The lungs are symmetrically well expanded. Superimposed mild interstitial pulmonary infiltrates appears improved in the interval, suggesting improving pulmonary edema given its relatively rapid resolution. No pneumothorax or pleural effusion. Right internal jugular chest port tip noted in the expected innominate vein. Coronary artery bypass grafting has been performed. Mild cardiomegaly is stable. IMPRESSION: 1. Interval improvement in pulmonary edema. 2. Stable mild cardiomegaly. Electronically Signed   By: Fidela Salisbury M.D.   On: 02/15/2023 01:10   DG Chest Port 1 View  Result Date: 02/14/2023 CLINICAL DATA:  Shortness of breath, chest pain EXAM: PORTABLE CHEST 1 VIEW COMPARISON:  Chest radiograph dated 01/06/2023. FINDINGS: The heart is enlarged. Vascular calcifications are seen in the aortic arch. There is mild diffuse  bilateral interstitial opacities. Trace pleural effusions are difficult to exclude. There is no pneumothorax. A right internal jugular central venous port catheter tip overlies the superior vena cava or brachiocephalic vein. Degenerative changes are seen in the spine. IMPRESSION: Cardiomegaly with mild diffuse bilateral interstitial opacities, which may reflect pulmonary edema or atypical infection. Electronically Signed   By: Zerita Boers M.D.   On: 02/14/2023 19:03   CT MAXILLOFACIAL W CONTRAST  Result Date: 01/29/2023 CLINICAL DATA:  Provided history: Subacute maxillary sinusitis. Sinusitis, immunocompromised (active chemotherapy for lymphoma). Additional history provided by the scanning technologist: Ongoing rhinorrhea, maxillary sinus pressure/pain. EXAM: CT MAXILLOFACIAL WITH CONTRAST TECHNIQUE: Multidetector CT images of the paranasal sinuses was performed according to the standard protocol following intravenous contrast administration. RADIATION DOSE REDUCTION: This exam was performed according to the departmental dose-optimization program which includes automated exposure control, adjustment of the mA and/or kV according to patient size and/or use of iterative reconstruction technique. CONTRAST:  32mL OMNIPAQUE IOHEXOL 300 MG/ML  SOLN COMPARISON:  Head CT 05/13/2014. FINDINGS: Paranasal sinuses: Frontal: Mild mucosal thickening within the bilateral frontal sinuses. Patent frontal sinus drainage pathways. Ethmoid: Mucosal thickening scattered within bilateral ethmoid air cells, overall mild. Maxillary: Minimal mucosal thickening within the right maxillary sinus. Mild mucosal thickening within the left maxillary sinus. Sphenoid: Trace mucosal thickening within the bilateral sphenoid sinuses. Patent sphenoethmoidal recesses. Right ostiomeatal unit: Mucosal thickening narrows the ethmoid infundibulum. Otherwise patent. Left ostiomeatal unit: Patent. Nasal passages: Leftward deviation of the nasal septum.  Mild mucosal thickening within the bilateral nasal passages. Anatomy: Pneumatization is present superior to the anterior ethmoid notches, bilaterally. Symmetric and intact olfactory grooves and fovea ethmoidalis, Keros I (1-69mm). Sellar sphenoid pneumatization pattern. 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 Background cerebral atrophy and chronic small vessel image disease at the imaged levels. No orbital mass or acute orbital finding. 16 mm right forehead lipoma. Poor dentition with multifocal periapical lucencies. IMPRESSION: Mild paranasal sinus  mucosal thickening, as detailed. No air-fluid levels. Mucosal thickening narrows the right ethmoid infundibulum. The sinus drainage pathways are otherwise patent. Leftward deviation of the nasal septum. Mild mucosal thickening within the bilateral nasal passages. Electronically Signed   By: Kellie Simmering D.O.   On: 01/29/2023 13:34     Subjective: Seen examined bedside, resting comfortably.  Sitting at edge of bed.  Wishes to discharge home today.  Continues to have some episodes of NSVT that self resolved, patient has been asymptomatic.  Seen by cardiology this morning and okay for discharge home today after receives LifeVest.  Has been set up with outpatient follow-up with electrophysiology, scheduled for 02/24/2023.  Patient with no other complaints or concerns at this time.  Denies headache, no dizziness, no chest pain, no palpitations, no shortness of breath, no abdominal pain, no fever/chills/night sweats, no nausea cefonicid diarrhea, no focal weakness, no fatigue, no paresthesias.  No acute events overnight per nursing staff.  Discharge Exam: Vitals:   02/17/23 0500 02/17/23 0740  BP: 121/68   Pulse: 63   Resp: 17   Temp:  98.3 F (36.8 C)  SpO2: 97%    Vitals:   02/17/23 0300 02/17/23 0400 02/17/23 0500 02/17/23 0740  BP: (!) 124/57 (!) 111/52 121/68   Pulse: 67 67 63   Resp: 19 20 17    Temp:    98.3 F (36.8 C)   TempSrc:    Oral  SpO2: 98% 97% 97%   Weight:      Height:        Physical Exam: GEN: NAD, alert and oriented x 3, chronically ill appearance, appears older than stated age HEENT: NCAT, PERRL, EOMI, sclera clear, MMM, poor dentition PULM: CTAB w/o wheezes/crackles, normal respiratory effort, on room air CV: RRR w/o M/G/R GI: abd soft, NTND, NABS, no R/G/M MSK: Bilateral lower extremity peripheral edema, muscle strength globally intact 5/5 bilateral upper/lower extremities NEURO: CN II-XII intact, no focal deficits, sensation to light touch intact PSYCH: normal mood/affect Integumentary: No concerning rashes/lesions/wounds noted on exposed skin surfaces.    The results of significant diagnostics from this hospitalization (including imaging, microbiology, ancillary and laboratory) are listed below for reference.     Microbiology: Recent Results (from the past 240 hour(s))  Resp panel by RT-PCR (RSV, Flu A&B, Covid) Anterior Nasal Swab     Status: None   Collection Time: 02/14/23  8:05 PM   Specimen: Anterior Nasal Swab  Result Value Ref Range Status   SARS Coronavirus 2 by RT PCR NEGATIVE NEGATIVE Final    Comment: (NOTE) SARS-CoV-2 target nucleic acids are NOT DETECTED.  The SARS-CoV-2 RNA is generally detectable in upper respiratory specimens during the acute phase of infection. The lowest concentration of SARS-CoV-2 viral copies this assay can detect is 138 copies/mL. A negative result does not preclude SARS-Cov-2 infection and should not be used as the sole basis for treatment or other patient management decisions. A negative result may occur with  improper specimen collection/handling, submission of specimen other than nasopharyngeal swab, presence of viral mutation(s) within the areas targeted by this assay, and inadequate number of viral copies(<138 copies/mL). A negative result must be combined with clinical observations, patient history, and  epidemiological information. The expected result is Negative.  Fact Sheet for Patients:  EntrepreneurPulse.com.au  Fact Sheet for Healthcare Providers:  IncredibleEmployment.be  This test is no t yet approved or cleared by the [place] FDA and  has been authorized for detection and/or diagnosis of SARS-CoV-2 by FDA  under an Emergency Use Authorization (EUA). This EUA will remain  in effect (meaning this test can be used) for the duration of the COVID-19 declaration under Section 564(b)(1) of the Act, 21 U.S.C.section 360bbb-3(b)(1), unless the authorization is terminated  or revoked sooner.       Influenza A by PCR NEGATIVE NEGATIVE Final   Influenza B by PCR NEGATIVE NEGATIVE Final    Comment: (NOTE) The Xpert Xpress SARS-CoV-2/FLU/RSV plus assay is intended as an aid in the diagnosis of influenza from Nasopharyngeal swab specimens and should not be used as a sole basis for treatment. Nasal washings and aspirates are unacceptable for Xpert Xpress SARS-CoV-2/FLU/RSV testing.  Fact Sheet for Patients: EntrepreneurPulse.com.au  Fact Sheet for Healthcare Providers: IncredibleEmployment.be  This test is not yet approved or cleared by the [place] FDA and has been authorized for detection and/or diagnosis of SARS-CoV-2 by FDA under an Emergency Use Authorization (EUA). This EUA will remain in effect (meaning this test can be used) for the duration of the COVID-19 declaration under Section 564(b)(1) of the Act, 21 U.S.C. section 360bbb-3(b)(1), unless the authorization is terminated or revoked.     Resp Syncytial Virus by PCR NEGATIVE NEGATIVE Final    Comment: (NOTE) Fact Sheet for Patients: EntrepreneurPulse.com.au  Fact Sheet for Healthcare Providers: IncredibleEmployment.be  This test is not yet approved or cleared by the [place] FDA and has been  authorized for detection and/or diagnosis of SARS-CoV-2 by FDA under an Emergency Use Authorization (EUA). This EUA will remain in effect (meaning this test can be used) for the duration of the COVID-19 declaration under Section 564(b)(1) of the Act, 21 U.S.C. section 360bbb-3(b)(1), unless the authorization is terminated or revoked.  Performed at [facility], [address], [place], [place] [address]   Blood Culture (routine x 2)     Status: None (Preliminary result)   Collection Time: 02/14/23  8:15 PM   Specimen: Left Antecubital; Blood  Result Value Ref Range Status   Specimen Description   Final    LEFT ANTECUBITAL BOTTLES DRAWN AEROBIC AND ANAEROBIC   Special Requests Blood Culture adequate volume  Final   Culture   Final    NO GROWTH 3 DAYS Performed at [facility], [address] [place], [place] [address]    Report Status PENDING  Incomplete  Blood Culture (routine x 2)     Status: None (Preliminary result)   Collection Time: 02/14/23  8:24 PM   Specimen: BLOOD RIGHT HAND  Result Value Ref Range Status   Specimen Description   Final    BLOOD RIGHT HAND BOTTLES DRAWN AEROBIC AND ANAEROBIC   Special Requests Blood Culture adequate volume  Final   Culture   Final    NO GROWTH 3 DAYS Performed at [facility], [address], [place], [place] [address]    Report Status PENDING  Incomplete  MRSA Next Gen by PCR, Nasal     Status: None   Collection Time: 02/15/23 11:50 AM   Specimen: Nasal Mucosa; Nasal Swab  Result Value Ref Range Status   MRSA by PCR Next Gen NOT DETECTED NOT DETECTED Final    Comment: (NOTE) The GeneXpert MRSA Assay (FDA approved for NASAL specimens only), is one component of a comprehensive MRSA colonization surveillance program. It is not intended to diagnose MRSA infection nor to guide or monitor treatment for MRSA infections. Test performance is not FDA approved in patients less than 45 years old. Performed at [facility], [name]  [place], [place] [address]      Labs: BNP (last 3 results) Recent Labs    02/14/23 1835  BNP [id number]*   Basic Metabolic Panel: Recent Labs  Lab 02/15/23 0440 02/15/23 1036 02/16/23 0036 02/16/23 1708 02/17/23 0406  NA 141 138 135 135 135  K 3.7 3.7 3.2* 4.2 3.8  CL 105 100 99 100 102  CO2 26 26 26 26 24   GLUCOSE 161* 265* 168* 171* 192*  BUN 11 12 12 15 16   CREATININE 1.05 1.06 1.07 1.16 0.99  CALCIUM 8.3* 8.4* 8.1* 8.3* 8.2*  MG 1.5* 2.2 2.2 2.4 2.1  PHOS  --   --  3.0  --   --    Liver Function Tests: Recent Labs  Lab 02/14/23 1835 02/16/23 0036  AST 25 20  ALT 24 24  ALKPHOS 132* 130*  BILITOT 0.5 0.9  PROT 6.7 6.9  ALBUMIN 3.4* 3.5   No results for input(s): "LIPASE", "AMYLASE" in the last 168 hours. No results for input(s): "AMMONIA" in the last 168 hours. CBC: Recent Labs  Lab 02/14/23 1835 02/15/23 0440 02/16/23 0036  WBC 25.1* 22.9* 19.6*  NEUTROABS 20.5*  --  15.5*  HGB 11.4* 11.7* 12.4*  HCT 34.4* 35.7* 37.0*  MCV 91.5 91.3 89.8  PLT 156 171 183   Cardiac Enzymes: No results for input(s): "CKTOTAL", "CKMB", "CKMBINDEX", "TROPONINI" in the last 168 hours. BNP: Invalid input(s): "POCBNP" CBG: Recent Labs  Lab 02/16/23 0749 02/16/23 1121 02/16/23 1611 02/16/23 2325 02/17/23 0737  GLUCAP 183* 270* 176* 190* 200*   D-Dimer No results for input(s): "DDIMER" in the last 72 hours. Hgb A1c Recent Labs    02/15/23 1008  HGBA1C 8.3*   Lipid Profile No results for input(s): "CHOL", "HDL", "LDLCALC", "TRIG", "CHOLHDL", "LDLDIRECT" in the last 72 hours. Thyroid function studies No results for input(s): "TSH", "T4TOTAL", "T3FREE", "THYROIDAB" in the last 72 hours.  Invalid input(s): "FREET3" Anemia work up No results for input(s): "VITAMINB12", "FOLATE", "FERRITIN", "TIBC", "IRON", "RETICCTPCT" in the last 72 hours. Urinalysis    Component Value Date/Time   COLORURINE YELLOW 12/07/2022 1020   APPEARANCEUR CLEAR 12/07/2022 1020   LABSPEC  1.014 12/07/2022 1020   PHURINE 8.0 12/07/2022 1020   GLUCOSEU 150 (A) 12/07/2022 1020   HGBUR NEGATIVE 12/07/2022 1020   BILIRUBINUR NEGATIVE 12/07/2022 1020   KETONESUR NEGATIVE 12/07/2022 1020   PROTEINUR NEGATIVE 12/07/2022 1020   NITRITE NEGATIVE 12/07/2022 1020   LEUKOCYTESUR NEGATIVE 12/07/2022 1020   Sepsis Labs Recent Labs  Lab 02/14/23 1835 02/15/23 0440 02/16/23 0036  WBC 25.1* 22.9* 19.6*   Microbiology Recent Results (from the past 240 hour(s))  Resp panel by RT-PCR (RSV, Flu A&B, Covid) Anterior Nasal Swab     Status: None   Collection Time: 02/14/23  8:05 PM   Specimen: Anterior Nasal Swab  Result Value Ref Range Status   SARS Coronavirus 2 by RT PCR NEGATIVE NEGATIVE Final    Comment: (NOTE) SARS-CoV-2 target nucleic acids are NOT DETECTED.  The SARS-CoV-2 RNA is generally detectable in upper respiratory specimens during the acute phase of infection. The lowest concentration of SARS-CoV-2 viral copies this assay can detect is 138 copies/mL. A negative result does not preclude SARS-Cov-2 infection and should not be used as the sole basis for treatment or other patient management decisions. A negative result may occur with  improper specimen collection/handling, submission of specimen other than nasopharyngeal swab, presence of viral mutation(s) within the areas targeted by this assay, and inadequate number of  viral copies(<138 copies/mL). A negative result must be combined with clinical observations, patient history, and epidemiological information. The expected result is Negative.  Fact Sheet for Patients:  EntrepreneurPulse.com.au  Fact Sheet for Healthcare Providers:  IncredibleEmployment.be  This test is no t yet approved or cleared by the [place] FDA and  has been authorized for detection and/or diagnosis of SARS-CoV-2 by FDA under an Emergency Use Authorization (EUA). This EUA will remain  in effect  (meaning this test can be used) for the duration of the COVID-19 declaration under Section 564(b)(1) of the Act, 21 U.S.C.section 360bbb-3(b)(1), unless the authorization is terminated  or revoked sooner.       Influenza A by PCR NEGATIVE NEGATIVE Final   Influenza B by PCR NEGATIVE NEGATIVE Final    Comment: (NOTE) The Xpert Xpress SARS-CoV-2/FLU/RSV plus assay is intended as an aid in the diagnosis of influenza from Nasopharyngeal swab specimens and should not be used as a sole basis for treatment. Nasal washings and aspirates are unacceptable for Xpert Xpress SARS-CoV-2/FLU/RSV testing.  Fact Sheet for Patients: EntrepreneurPulse.com.au  Fact Sheet for Healthcare Providers: IncredibleEmployment.be  This test is not yet approved or cleared by the [place] FDA and has been authorized for detection and/or diagnosis of SARS-CoV-2 by FDA under an Emergency Use Authorization (EUA). This EUA will remain in effect (meaning this test can be used) for the duration of the COVID-19 declaration under Section 564(b)(1) of the Act, 21 U.S.C. section 360bbb-3(b)(1), unless the authorization is terminated or revoked.     Resp Syncytial Virus by PCR NEGATIVE NEGATIVE Final    Comment: (NOTE) Fact Sheet for Patients: EntrepreneurPulse.com.au  Fact Sheet for Healthcare Providers: IncredibleEmployment.be  This test is not yet approved or cleared by the [place] FDA and has been authorized for detection and/or diagnosis of SARS-CoV-2 by FDA under an Emergency Use Authorization (EUA). This EUA will remain in effect (meaning this test can be used) for the duration of the COVID-19 declaration under Section 564(b)(1) of the Act, 21 U.S.C. section 360bbb-3(b)(1), unless the authorization is terminated or revoked.  Performed at [facility], [address], [place], [place] [address]   Blood Culture (routine x 2)      Status: None (Preliminary result)   Collection Time: 02/14/23  8:15 PM   Specimen: Left Antecubital; Blood  Result Value Ref Range Status   Specimen Description   Final    LEFT ANTECUBITAL BOTTLES DRAWN AEROBIC AND ANAEROBIC   Special Requests Blood Culture adequate volume  Final   Culture   Final    NO GROWTH 3 DAYS Performed at [facility], [address], [place], [place] [address]    Report Status PENDING  Incomplete  Blood Culture (routine x 2)     Status: None (Preliminary result)   Collection Time: 02/14/23  8:24 PM   Specimen: BLOOD RIGHT HAND  Result Value Ref Range Status   Specimen Description   Final    BLOOD RIGHT HAND BOTTLES DRAWN AEROBIC AND ANAEROBIC   Special Requests Blood Culture adequate volume  Final   Culture   Final    NO GROWTH 3 DAYS Performed at [facility], [address], [place], [place] [address]    Report Status PENDING  Incomplete  MRSA Next Gen by PCR, Nasal     Status: None   Collection Time: 02/15/23 11:50 AM   Specimen: Nasal Mucosa; Nasal Swab  Result Value Ref Range Status   MRSA by PCR Next Gen NOT DETECTED  NOT DETECTED Final    Comment: (NOTE) The GeneXpert MRSA Assay (FDA approved for NASAL specimens only), is one component of a comprehensive MRSA colonization surveillance program. It is not intended to diagnose MRSA infection nor to guide or monitor treatment for MRSA infections. Test performance is not FDA approved in patients less than 81 years old. Performed at [facility], [address], [place], [place] [address]      Time coordinating discharge: Over 30 minutes  SIGNED:   Michai Dieppa J [place] DO  [facility] 02/17/2023, 11:35 AM

## 2023-02-17 NOTE — TOC Progression Note (Signed)
Transition of Care [facility]) - Progression Note    Patient Details  Name: DAMITRI ZAPANTA MRN: [id number] Date of Birth: [birth year]  Transition of Care [facility] CM/SW Contact  Ihor Gully, LCSW Phone Number: 02/17/2023, 10:29 AM  Clinical Narrative:    Spoke with Paulla Fore with [employer]. Life vest has been ordered for patient by Estella Husk. Chrys Racer request order and clinicals be sent to her attention at [facility] via fax [phone].   Expected Discharge Plan: [place] Barriers to Discharge: Continued Medical Work up  Expected Discharge Plan and Services In-house Referral: Clinical Social Work     Living arrangements for the past 2 months: Single Family Home                                       Social Determinants of Health (SDOH) Interventions SDOH Screenings   Depression (PHQ2-9): Low Risk  (11/12/2018)  Tobacco Use: Low Risk  (02/14/2023)    Readmission Risk Interventions    02/16/2023    2:59 PM  Readmission Risk Prevention Plan  Transportation Screening Complete  HRI or [place] Complete  Social Work Consult for [place] Planning/Counseling Complete  Palliative Care Screening Not Applicable  Medication Review Press photographer) Complete

## 2023-02-17 NOTE — Progress Notes (Addendum)
Rounding Note    Patient Name: Alejandro [name] Date of Encounter: 02/17/2023  [place] Cardiologist: Rozann Lesches, MD    Subjective   Wants to go home. No complaints  Inpatient Medications    Scheduled Meds:  allopurinol  300 mg Oral Daily   apixaban  5 mg Oral BID   atorvastatin  40 mg Oral QHS   Chlorhexidine Gluconate Cloth  6 each Topical Daily   docusate sodium  100 mg Oral BID   furosemide  40 mg Intravenous Q12H   insulin aspart  0-5 Units Subcutaneous QHS   insulin aspart  0-6 Units Subcutaneous TID WC   loratadine  10 mg Oral Daily   pantoprazole  40 mg Oral Daily   potassium chloride  20 mEq Oral Once   sacubitril-valsartan  1 tablet Oral BID   spironolactone  12.5 mg Oral Daily   Continuous Infusions:  PRN Meds: acetaminophen **OR** acetaminophen, hydrALAZINE, hydrALAZINE, melatonin, oxyCODONE   Vital Signs    Vitals:   02/17/23 0300 02/17/23 0400 02/17/23 0500 02/17/23 0740  BP: (!) 124/57 (!) 111/52 121/68   Pulse: 67 67 63   Resp: 19 20 17    Temp:    98.3 F (36.8 C)  TempSrc:    Oral  SpO2: 98% 97% 97%   Weight:      Height:        Intake/Output Summary (Last 24 hours) at 02/17/2023 0836 Last data filed at 02/16/2023 2200 Gross per 24 hour  Intake 1329.86 ml  Output 1590 ml  Net -260.14 ml      02/16/2023    4:12 AM 02/14/2023    6:01 PM 02/04/2023    7:50 AM  Last 3 Weights  Weight (lbs) 230 lb 2.6 oz 235 lb 241 lb 6.4 oz  Weight (kg) 104.4 kg 106.595 kg 109.498 kg      Telemetry    Afib with runs NSVT yest but improving, some NSR - Personally Reviewed  ECG     Afib with QTc 486 - Personally Reviewed  Physical Exam    GEN: No acute distress.   Neck: No JVD Cardiac: RRR, no murmurs, rubs, or gallops.  Respiratory: scattered rhonchi GI: Soft, nontender, non-distended  MS: No edema; No deformity. Neuro:  Nonfocal  Psych: Normal affect   Labs    High Sensitivity Troponin:   Recent Labs  Lab  02/14/23 1835 02/15/23 2324 02/16/23 0304  TROPONINIHS 19* 31* 31*     Chemistry Recent Labs  Lab 02/14/23 1835 02/15/23 0440 02/16/23 0036 02/16/23 1708 02/17/23 0406  NA 137   < > 135 135 135  K 3.3*   < > 3.2* 4.2 3.8  CL 103   < > 99 100 102  CO2 24   < > 26 26 24   GLUCOSE 192*   < > 168* 171* 192*  BUN 11   < > 12 15 16   CREATININE 1.15   < > 1.07 1.16 0.99  CALCIUM 7.7*   < > 8.1* 8.3* 8.2*  MG  --    < > 2.2 2.4 2.1  PROT 6.7  --  6.9  --   --   ALBUMIN 3.4*  --  3.5  --   --   AST 25  --  20  --   --   ALT 24  --  24  --   --   ALKPHOS 132*  --  130*  --   --  BILITOT 0.5  --  0.9  --   --   GFRNONAA >60   < > >60 >60 >60  ANIONGAP 10   < > 10 9 9    < > = values in this interval not displayed.    Lipids No results for input(s): "CHOL", "TRIG", "HDL", "LABVLDL", "LDLCALC", "CHOLHDL" in the last 168 hours.  Hematology Recent Labs  Lab 02/14/23 1835 02/15/23 0440 02/16/23 0036  WBC 25.1* 22.9* 19.6*  RBC 3.76* 3.91* 4.12*  HGB 11.4* 11.7* 12.4*  HCT 34.4* 35.7* 37.0*  MCV 91.5 91.3 89.8  MCH 30.3 29.9 30.1  MCHC 33.1 32.8 33.5  RDW 15.2 15.4 15.2  PLT 156 171 183   Thyroid No results for input(s): "TSH", "FREET4" in the last 168 hours.  BNP Recent Labs  Lab 02/14/23 1835  BNP 890.0*    DDimer No results for input(s): "DDIMER" in the last 168 hours.   Radiology    ECHOCARDIOGRAM COMPLETE  Result Date: 02/15/2023    ECHOCARDIOGRAM REPORT   Patient Name:   Alejandro [name] Date of Exam: 02/15/2023 Medical Rec #:  [id number]      Height:       71.0 in Accession #:    [id number]     Weight:       235.0 lb Date of Birth:  04-Jan-1941       BSA:          2.258 m Patient Age:    82 years       BP:           171/69 mmHg Patient Gender: M              HR:           67 bpm. Exam Location:  [facility] Procedure: 2D Echo, Cardiac Doppler and Color Doppler Indications:    Dyspnea R06.00  History:        Patient has prior history of Echocardiogram examinations, most                  recent 09/19/2022. CHF and Cardiomyopathy, CAD and Previous                 Myocardial Infarction, Prior CABG, Arrythmias:Atrial                 Fibrillation and PVC; Risk Factors:Hypertension, Dyslipidemia                 and Diabetes. Hx of (HFimpEF) heart failure with improved EF.  Sonographer:    Alvino Chapel RCS Referring Phys: [id number] [place]  1. Left ventricular ejection fraction, by estimation, is 45 to 50%. The left ventricle has mildly decreased function. The left ventricle demonstrates regional wall motion abnormalities (see scoring diagram/findings for description). The left ventricular  internal cavity size was moderately dilated. Left ventricular diastolic parameters are consistent with Grade II diastolic dysfunction (pseudonormalization).  2. Right ventricular systolic function is normal. The right ventricular size is mildly enlarged. There is mildly elevated pulmonary artery systolic pressure.  3. Left atrial size was severely dilated.  4. Right atrial size was mildly dilated.  5. The mitral valve is normal in structure. No evidence of mitral valve regurgitation. No evidence of mitral stenosis.  6. The aortic valve is calcified. There is moderate calcification of the aortic valve. There is moderate thickening of the aortic valve. Aortic valve regurgitation is not visualized. Moderate aortic valve stenosis. Aortic valve mean gradient measures  19.0 mmHg. Aortic valve Vmax measures 2.97 m/s.  7. Aortic dilatation noted. There is mild dilatation of the aortic root, measuring 43 mm.  8. The inferior vena cava is normal in size with greater than 50% respiratory variability, suggesting right atrial pressure of 3 mmHg. Comparison(s): No significant change from prior study. Prior images reviewed side by side. FINDINGS  Left Ventricle: Left ventricular ejection fraction, by estimation, is 45 to 50%. The left ventricle has mildly decreased function. The left ventricle  demonstrates regional wall motion abnormalities. The left ventricular internal cavity size was moderately dilated. There is no left ventricular hypertrophy. Left ventricular diastolic parameters are consistent with Grade II diastolic dysfunction (pseudonormalization).  LV Wall Scoring: The basal inferior segment and basal inferoseptal segment are akinetic. Right Ventricle: The right ventricular size is mildly enlarged. No increase in right ventricular wall thickness. Right ventricular systolic function is normal. There is mildly elevated pulmonary artery systolic pressure. The tricuspid regurgitant velocity is 3.20 m/s, and with an assumed right atrial pressure of 3 mmHg, the estimated right ventricular systolic pressure is 38.1 mmHg. Left Atrium: Left atrial size was severely dilated. Right Atrium: Right atrial size was mildly dilated. Pericardium: There is no evidence of pericardial effusion. Mitral Valve: The mitral valve is normal in structure. No evidence of mitral valve regurgitation. No evidence of mitral valve stenosis. Tricuspid Valve: The tricuspid valve is normal in structure. Tricuspid valve regurgitation is mild . No evidence of tricuspid stenosis. Aortic Valve: The aortic valve is calcified. There is moderate calcification of the aortic valve. There is moderate thickening of the aortic valve. Aortic valve regurgitation is not visualized. Moderate aortic stenosis is present. Aortic valve mean gradient measures 19.0 mmHg. Aortic valve peak gradient measures 35.3 mmHg. Aortic valve area, by VTI measures 1.91 cm. Pulmonic Valve: The pulmonic valve was normal in structure. Pulmonic valve regurgitation is mild. No evidence of pulmonic stenosis. Aorta: Aortic dilatation noted. There is mild dilatation of the aortic root, measuring 43 mm. Venous: The inferior vena cava is normal in size with greater than 50% respiratory variability, suggesting right atrial pressure of 3 mmHg. IAS/Shunts: No atrial level shunt  detected by color flow Doppler.  LEFT VENTRICLE PLAX 2D LVIDd:         6.40 cm   Diastology LVIDs:         4.70 cm   LV e' medial:    7.07 cm/s LV PW:         1.40 cm   LV E/e' medial:  20.2 LV IVS:        1.10 cm   LV e' lateral:   10.80 cm/s LVOT diam:     2.50 cm   LV E/e' lateral: 13.2 LV SV:         130 LV SV Index:   57 LVOT Area:     4.91 cm  RIGHT VENTRICLE RV S prime:     12.30 cm/s TAPSE (M-mode): 2.1 cm LEFT ATRIUM              Index        RIGHT ATRIUM           Index LA diam:        5.70 cm  2.52 cm/m   RA Area:     26.10 cm LA Vol (A2C):   96.6 ml  42.78 ml/m  RA Volume:   80.30 ml  35.56 ml/m LA Vol (A4C):   124.0 ml 54.91 ml/m LA Biplane  Vol: 115.0 ml 50.93 ml/m  AORTIC VALVE AV Area (Vmax):    1.69 cm AV Area (Vmean):   1.69 cm AV Area (VTI):     1.91 cm AV Vmax:           297.00 cm/s AV Vmean:          201.000 cm/s AV VTI:            0.680 m AV Peak Grad:      35.3 mmHg AV Mean Grad:      19.0 mmHg LVOT Vmax:         102.00 cm/s LVOT Vmean:        69.100 cm/s LVOT VTI:          0.264 m LVOT/AV VTI ratio: 0.39  AORTA Ao Root diam: 4.30 cm MITRAL VALVE                TRICUSPID VALVE MV Area (PHT): 5.27 cm     TR Peak grad:   41.0 mmHg MV Decel Time: 144 msec     TR Vmax:        320.00 cm/s MV E velocity: 143.00 cm/s MV A velocity: 73.80 cm/s   SHUNTS MV E/A ratio:  1.94         Systemic VTI:  0.26 m                             Systemic Diam: 2.50 cm Candee Furbish MD Electronically signed by Candee Furbish MD Signature Date/Time: 02/15/2023/12:29:11 PM    Final     Cardiac Studies    Echo 02/15/23 IMPRESSIONS     1. Left ventricular ejection fraction, by estimation, is 45 to 50%. The  left ventricle has mildly decreased function. The left ventricle  demonstrates regional wall motion abnormalities (see scoring  diagram/findings for description). The left ventricular   internal cavity size was moderately dilated. Left ventricular diastolic  parameters are consistent with Grade II  diastolic dysfunction  (pseudonormalization).   2. Right ventricular systolic function is normal. The right ventricular  size is mildly enlarged. There is mildly elevated pulmonary artery  systolic pressure.   3. Left atrial size was severely dilated.   4. Right atrial size was mildly dilated.   5. The mitral valve is normal in structure. No evidence of mitral valve  regurgitation. No evidence of mitral stenosis.   6. The aortic valve is calcified. There is moderate calcification of the  aortic valve. There is moderate thickening of the aortic valve. Aortic  valve regurgitation is not visualized. Moderate aortic valve stenosis.  Aortic valve mean gradient measures  19.0 mmHg. Aortic valve Vmax measures 2.97 m/s.   7. Aortic dilatation noted. There is mild dilatation of the aortic root,  measuring 43 mm.   8. The inferior vena cava is normal in size with greater than 50%  respiratory variability, suggesting right atrial pressure of 3 mmHg.   Comparison(s): No significant change from prior study. Prior images  reviewed side by side.   Patient Profile     82 y.o. male  with a hx of  with history of CAD CABG 2019, ICM LVEF 45%, combined CHF, mod AS, CKD stage 3b, mod MR  who is being seen 02/16/2023 for the evaluation of Torsades at the request of Dr. [place]   Assessment & Plan     Torsades on tikosyn and hypomagnesemia-tikosyn stopped and Mg replaced K 3.8  today. More NSVT yest.  Patient refuses to transfer to [facility] for further w/u and treatment. Consider life vest,. Early f/u with Dr. Lovena Le 3/26.   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 Irbesartan switched to entresto 49/51 mg bid   PAF on  Eliquis and now tikosyn stopped-has appt with Dr. Lovena Le 02/24/23   History of bradycardia no longer on AV nodal blocking agents   History of PSVT   CAD CABG 2019, no  ischemia on NST 01/2022-chest pressure 02/15/23 patient says was due to position in bed and eased with repositioning. No chest pain at home.   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

## 2023-02-17 NOTE — Care Management Important Message (Signed)
Important Message  Patient Details  Name: Alejandro [name] MRN: [id number] Date of Birth: 07-29-1941   Medicare Important Message Given:  N/A - LOS <3 / Initial given by admissions     Tommy Medal 02/17/2023, 11:20 AM

## 2023-02-19 LAB — CULTURE, BLOOD (ROUTINE X 2)
Culture: NO GROWTH
Culture: NO GROWTH
Special Requests: ADEQUATE
Special Requests: ADEQUATE

## 2023-02-24 ENCOUNTER — Encounter: Payer: Self-pay | Admitting: Internal Medicine

## 2023-02-24 ENCOUNTER — Ambulatory Visit: Payer: Medicare Other | Attending: Internal Medicine | Admitting: Internal Medicine

## 2023-02-24 VITALS — BP 128/68 | HR 66 | Ht 70.0 in | Wt 227.2 lb

## 2023-02-24 DIAGNOSIS — I4821 Permanent atrial fibrillation: Secondary | ICD-10-CM

## 2023-02-24 NOTE — Progress Notes (Addendum)
HPI Mr. Quebedeaux returns for evaluation of atrial fib and bradycardia. He has sinus node dysfunction and a h/o bradycardia. He has atrial fib and is asymptomatic. He has followed with Dr. [name] in the past. He has not had syncope. His main complaint is related to his sinus congestion. No palpitations or chest pain or sob.  The patient was recently in the hospital with pneumonia, and developed Torsades on dofetilide. This was stopped. I have reviewed the ECG's and his QT interval was dramatically prolonged which occurred while he was in the hospital in association with taking both dofetilide (chronically) and initiation of azithromycin by the ED MD. In addition he had hypomagnesemia.  No Known Allergies   Current Outpatient Medications  Medication Sig Dispense Refill   acetaminophen (TYLENOL) 500 MG tablet Take 1,000 mg by mouth every 6 (six) hours as needed for moderate pain or mild pain.     albuterol (VENTOLIN HFA) 108 (90 Base) MCG/ACT inhaler Inhale 2 puffs into the lungs every 6 (six) hours as needed for wheezing or shortness of breath.     allopurinol (ZYLOPRIM) 300 MG tablet Take 1 tablet (300 mg total) by mouth daily. 30 tablet 3   apixaban (ELIQUIS) 5 MG TABS tablet Take 1 tablet (5 mg total) by mouth 2 (two) times daily. 60 tablet 5   atorvastatin (LIPITOR) 40 MG tablet Take 40 mg by mouth at bedtime.     cyanocobalamin (VITAMIN B12) 1000 MCG tablet Take 1,000 mcg by mouth daily.     cyanocobalamin (VITAMIN B12) 1000 MCG/ML injection Inject 1,000 mcg into the skin every 30 (thirty) days.     docusate sodium (COLACE) 100 MG capsule Take 1 capsule (100 mg total) by mouth 2 (two) times daily. (Patient taking differently: Take 100 mg by mouth 2 (two) times daily as needed for mild constipation or moderate constipation.) 60 capsule 2   famotidine (PEPCID) 20 MG tablet Take 1 tablet (20 mg total) by mouth 2 (two) times daily.     ferrous sulfate 325 (65 FE) MG EC tablet Take 1 tablet  (325 mg total) by mouth daily with breakfast. 100 tablet 1   fluticasone (FLONASE) 50 MCG/ACT nasal spray Place 1 spray into both nostrils daily. 11.1 mL 2   furosemide (LASIX) 80 MG tablet Take 1 tablet (80 mg total) by mouth daily. 30 tablet 2   glipiZIDE (GLUCOTROL XL) 10 MG 24 hr tablet Take 10 mg by mouth daily.     loratadine (CLARITIN) 10 MG tablet Take 10 mg by mouth daily.     Melatonin 10 MG CAPS Take 30 mg by mouth at bedtime as needed (sleep).     metFORMIN (GLUCOPHAGE-XR) 750 MG 24 hr tablet Take 1 tablet by mouth 2 (two) times daily with a meal.     oxyCODONE (ROXICODONE) 5 MG immediate release tablet Take 1 tablet (5 mg total) by mouth every 6 (six) hours as needed. (Patient taking differently: Take 5 mg by mouth every 6 (six) hours as needed for severe pain or moderate pain.) 12 tablet 0   pantoprazole (PROTONIX) 40 MG tablet Take 40 mg by mouth daily.     polyethylene glycol (MIRALAX / GLYCOLAX) 17 g packet Take 17 g by mouth daily. (Patient taking differently: Take 17 g by mouth daily as needed for moderate constipation.) 30 each 0   potassium chloride (MICRO-K) 10 MEQ CR capsule Take 10 mEq by mouth daily.     potassium chloride SA (KLOR-CON  M) 20 MEQ tablet Take 1 tablet (20 mEq total) by mouth daily. 30 tablet 2   Povidone, PF, (IVIZIA DRY EYES) 0.5 % SOLN Place 1 drop into both eyes daily as needed (dry eyes).     sacubitril-valsartan (ENTRESTO) 49-51 MG Take 1 tablet by mouth 2 (two) times daily. 60 tablet 2   sodium chloride (OCEAN) 0.65 % SOLN nasal spray Place 1 spray into both nostrils as needed for congestion. 44 mL 2   spironolactone (ALDACTONE) 25 MG tablet Take 0.5 tablets (12.5 mg total) by mouth daily. 45 tablet 3   prochlorperazine (COMPAZINE) 10 MG tablet Take 1 tablet (10 mg total) by mouth every 6 (six) hours as needed for nausea or vomiting. (Patient not taking: Reported on 02/24/2023) 60 tablet 1   No current facility-administered medications for this visit.    Facility-Administered Medications Ordered in Other Visits  Medication Dose Route Frequency Provider Last Rate Last Admin   sodium chloride flush (NS) 0.9 % injection 10 mL  10 mL Intracatheter PRN Derek Jack, MD   10 mL at 01/07/23 1446     Past Medical History:  Diagnosis Date   A-fib [facility])    AAA Ultrasound 01/11/2020    AAA [place] 01/11/20:  no abdominal aortic aneurysm, 2.4 cm   Aortic insufficiency    a. mild-mod by intraop TEE 05/2018 (no aortic stenosis) // Echo 2/21: mild //  Echo 9/22: trivial   Aortic stenosis    Echo 9/22: mild (mean 16) // Echo 2/21: mild (mean 18.7)   CAD (coronary artery disease)    a. NSTEMI 05/2018 with multivessel disease -> s/p CABGx4 05/11/18 // Myoview 06/07/20: inf scar w mild peri-infarct ischemia, EF 37; Int Risk   Cancer (HCC)    Carotid artery disease ([place])    PreCABG [place] 05/07/18: Bilat ICA 1-39   CKD (chronic kidney disease), stage III ([place])    HFimpEF (heart failure with improved EF)    Echocardiogram 01/11/20: EF 45, Gr 3 DD, mild AI, mild AS (mean 18.7 mmHg) // Echocardiogram 08/14/21:  EF 55-60, inf HK, mild LVH, Gr 1 DD, normal RVSF, RVSP 25.3, mild LAE, trivial MR, MAC, trivial AI, mild AS (mean 16 mmHg, Vmax 283 cm/s, DI 0.41)   History of nephrolithiasis 2004   Hyperlipidemia    Hypertension    IBS (irritable colon syndrome)    Ischemic cardiomyopathy    Mitral regurgitation    Echo 9/22: trivial MR   Postoperative atrial fibrillation (HCC)    PVC's (premature ventricular contractions)    Reflux    Type 2 diabetes mellitus ([place])     ROS:   All systems reviewed and negative except as noted in the HPI.   Past Surgical History:  Procedure Laterality Date   BRONCHIAL NEEDLE ASPIRATION BIOPSY  11/17/2022   Procedure: BRONCHIAL NEEDLE ASPIRATION BIOPSIES;  Surgeon: Collene Gobble, MD;  Location: [place];  Service: Cardiopulmonary;;   CARDIOVERSION     CORONARY ARTERY BYPASS GRAFT N/A 05/11/2018   Procedure: CORONARY  ARTERY BYPASS GRAFTING (CABG), on pump, times four, using left internal mammary artery and endoscopically harvested right greater saphenous leg vein.;  Surgeon: Ivin Poot, MD;  Location: [place];  Service: Open Heart Surgery;  Laterality: N/A;   ESOPHAGOGASTRODUODENOSCOPY  02/04/2012   Procedure: ESOPHAGOGASTRODUODENOSCOPY (EGD);  Surgeon: Rogene Houston, MD;  [facility];  Service: Endoscopy;  Laterality: N/A;  100   INGUINAL LYMPH NODE BIOPSY Left 12/15/2022   Procedure: INGUINAL LYMPH  NODE BIOPSY;  Surgeon: Rusty Aus, DO;  Location: [facility];  Service: General;  Laterality: Left;   LEFT HEART CATH AND CORONARY ANGIOGRAPHY N/A 05/04/2018   Procedure: LEFT HEART CATH AND CORONARY ANGIOGRAPHY;  Surgeon: Belva Crome, MD;  Location: [place] [facility];  Service: Cardiovascular;  Laterality: N/A;   NASAL FRACTURE SURGERY     PORTACATH PLACEMENT Right 01/06/2023   Procedure: INSERTION PORT-A-CATH;  Surgeon: Rusty Aus, DO;  Location: [facility];  Service: General;  Laterality: Right;   TEE WITHOUT CARDIOVERSION N/A 05/11/2018   Procedure: TRANSESOPHAGEAL ECHOCARDIOGRAM (TEE);  Surgeon: Prescott Gum, Collier Salina, MD;  Location: [place];  Service: Open Heart Surgery;  Laterality: N/A;   VIDEO BRONCHOSCOPY WITH ENDOBRONCHIAL ULTRASOUND Right 11/17/2022   Procedure: VIDEO BRONCHOSCOPY WITH ENDOBRONCHIAL ULTRASOUND;  Surgeon: Collene Gobble, MD;  Location: [place];  Service: Cardiopulmonary;  Laterality: Right;     Family History  Problem Relation Age of Onset   Diabetes Mellitus II Mother    Heart attack Father    Heart attack Brother    Heart disease Other    Arthritis Other    Asthma Other    Diabetes Other      Social History   Socioeconomic History   Marital status: Widowed    Spouse name: Not on file   Number of children: Not on file   Years of education: Not on file   Highest education level: Not on file  Occupational History   Not on file   Tobacco Use   Smoking status: Never   Smokeless tobacco: Never  Vaping Use   Vaping Use: Never used  Substance and Sexual Activity   Alcohol use: No    Alcohol/week: 0.0 standard drinks of alcohol   Drug use: No   Sexual activity: Yes  Other Topics Concern   Not on file  Social History Narrative   Not on file   Social Determinants of Health   Financial Resource Strain: Not on file  Food Insecurity: Not on file  Transportation Needs: Not on file  Physical Activity: Not on file  Stress: Not on file  Social Connections: Not on file  Intimate Partner Violence: Not on file     BP 128/68   Pulse 66   Ht 5\' 10"  (1.778 m)   Wt 227 lb 3.2 oz (103.1 kg)   SpO2 97%   BMI 32.60 kg/m   Physical Exam:  Well appearing NAD HEENT: Unremarkable Neck:  No JVD, no thyromegally Lymphatics:  No adenopathy Back:  No CVA tenderness Lungs:  Clear HEART:  Regular rate rhythm, no murmurs, no rubs, no clicks Abd:  soft, positive bowel sounds, no organomegally, no rebound, no guarding Ext:  2 plus pulses, no edema, no cyanosis, no clubbing Skin:  No rashes no nodules Neuro:  CN II through XII intact, motor grossly intact  EKG  DEVICE  Normal device function.  See PaceArt for details.   Assess/Plan:  Atrial fib - off of all AV nodal blocking drugs his VR appears to be well controlled. I do not think he needs a PPM.  Sinus node dysfunction - review of his old ECG's shows marked sinus node dysfunction but it appears that he is now mostly in atrial fib. He will call us if his hear rate slows.  CAD - he denies anginal symptoms. We will follow. Diastolic heart failure - he is class 2. He will continue his current meds including lasix. I asked him to  weight himself every day and call if his weight goes up more than 5 lbs. QT prolongation - this occurred due to the combination of hypomag, dofetilide and azithromycin. As the dofetilide was stopped as was the azithromycin, no additional  treamtent is indicated.   Carleene Overlie Aaralynn Shepheard,MD

## 2023-02-24 NOTE — Patient Instructions (Signed)
Medication Instructions:  Your physician recommends that you continue on your current medications as directed. Please refer to the Current Medication list given to you today.  Your provider would like you to weigh daily and call if your weight goes up by 5 pounds   *If you need a refill on your cardiac medications before your next appointment, please call your pharmacy*   Lab Work: NONE   If you have labs (blood work) drawn today and your tests are completely normal, you will receive your results only by: [place] (if you have MyChart) OR A paper copy in the mail If you have any lab test that is abnormal or we need to change your treatment, we will call you to review the results.   Testing/Procedures: NONE    Follow-Up: At [facility], you and your health needs are our priority.  As part of our continuing mission to provide you with exceptional heart care, we have created designated Provider Care Teams.  These Care Teams include your primary Cardiologist (physician) and Advanced Practice Providers (APPs -  Physician Assistants and Nurse Practitioners) who all work together to provide you with the care you need, when you need it.  We recommend signing up for the patient portal called "MyChart".  Sign up information is provided on this After Visit Summary.  MyChart is used to connect with patients for Virtual Visits (Telemedicine).  Patients are able to view lab/test results, encounter notes, upcoming appointments, etc.  Non-urgent messages can be sent to your provider as well.   To learn more about what you can do with MyChart, go to [email or web address].    Your next appointment:   1 year(s)  Provider:   Cristopher Peru, MD    Other Instructions Thank you for choosing [facility]!

## 2023-02-25 ENCOUNTER — Encounter: Payer: Self-pay | Admitting: Internal Medicine

## 2023-03-03 NOTE — Progress Notes (Signed)
[facility] [address], [place] [address]    Clinic Day:  03/04/2023  Referring physician: Pablo Lawrence, NP  Patient Care Team: Pablo Lawrence, NP as PCP - General (Adult Health Nurse Practitioner) Satira Sark, MD as PCP - Cardiology (Cardiology) Derek Jack, MD as Medical Oncologist (Medical Oncology) Brien Mates, RN as Oncology Nurse Navigator (Medical Oncology)   ASSESSMENT & PLAN:   Assessment: 1.  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  No B symptoms. - Reports hoarseness of voice for the last 1 month with occasional choking to liquids. - CT chest (09/03/2022): Interval enlargement of matted appearing pretracheal and right paratracheal lymph node measuring 2.7 x 2.7 cm, previously 1.9 x 1.6 cm (08/23/2021), mild pulmonary fibrosis. - PET/CT scan (09/11/2022): Mildly metabolically lower cervical lymph nodes bilaterally, numerous on the right with SUV 3.7.  Nodule in the deep lobe of the right parotid gland, 1.4 x 1.0 SUV 10.2.  Asymmetric hypermetabolic activity in the right vocal cord, SUV 8.7 with no focal mass.  Right paratracheal mass 3.7 x 2.8 cm SUV 10.6.  10 mm left paratracheal node with SUV 5.5.  Left para-aortic adenopathy 4.5 x 2.4 cm, SUV 12.4.  Additional smaller retroperitoneal and mesenteric lymph nodes mildly hypermetabolic.  10 mm left inguinal node with SUV 4.3.  Hypermetabolic activity along the medial aspect of the left femoral neck SUV 6.8.  No clear bone lesion on the CT images. - Retroperitoneal lymph node biopsy (10/10/2022): Atypical lymphoid proliferation, but concerning for lymphoproliferative disorder. - ENT evaluation (Dr. Constance Holster) fiberoptic laryngoscopy: Left vocal cord paralysis - Bronchoscopy and biopsy of 4R lymph node (11/17/2022): Suspicious for B-cell lymphoma. - Left inguinal excisional biopsy on 12/15/2022 - Pathology: High-grade  follicular lymphoma, grade 3 - Cycle 1 of BR on 01/07/2023   2.  Social/family history: - He lives at home by himself and is independent of all ADLs and IADLs. 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  Non-smoker.  Has secondhand smoke exposure from his wife. - No family history of malignancies.   Plan: 1.  Stage III high-grade follicular lymphoma (grade 3A): - He received cycle 2 with Bendamustine 60 mg/m. - He felt more tired but was able to do all his ADLs.  He lost about 2 pounds since last visit. - Reviewed labs today which showed normal LFTs.  CBC was grossly normal.  LDH is normal. - Proceed with cycle 3 today at the same dose of Bendamustine 60 mg/m.  RTC 4 weeks with PET scan.  If there is very good response on PET scan will consider decreasing Bendamustine to 50 mg/m for better tolerability.   2.  TLS prophylaxis: - Continue allopurinol 300 mg daily.  Uric acid is 4.8 today.   3.  Left vocal cord paralysis: - Dr. Constance Holster did laryngoscopy on 10/30/2022. - We will arrange for CT soft tissue neck with contrast.  No orders of the defined types were placed in this encounter.     I,Katie Daubenspeck,acting as a Education administrator for Derek Jack, MD.,have documented all relevant documentation on the behalf of Derek Jack, MD,as directed by  Derek Jack, MD while in the presence of Derek Jack, MD.   I, Derek Jack MD, have reviewed the above documentation for accuracy and completeness, and I agree with the above.   Derek Jack, MD   4/3/20245:15 PM  CHIEF COMPLAINT:  Diagnosis: 3A follicular lymphoma, stage III    Cancer Staging  Follicular lymphoma grade 3a Staging form: Hodgkin and Non-Hodgkin Lymphoma, AJCC 8th Edition - Clinical stage from [id number]: Stage III (Follicular lymphoma) - Signed by Derek Jack, MD on 12/23/2022    Prior Therapy: none  Current Therapy:  Bendamustine and rituximab     HISTORY OF PRESENT ILLNESS:   Oncology History  Follicular lymphoma grade 3a  12/23/2022 Initial Diagnosis   Follicular lymphoma grade 3a ([place])   12/23/2022 Cancer Staging   Staging form: Hodgkin and Non-Hodgkin Lymphoma, AJCC 8th Edition - Clinical stage from [id number]: Stage III (Follicular lymphoma) - Signed by Derek Jack, MD on 12/23/2022 Histopathologic type: Follicular lymphoma, grade 3 Stage prefix: Initial diagnosis   01/07/2023 -  Chemotherapy   Patient is on Treatment Plan : NON-HODGKINS LYMPHOMA Rituximab D1 + Bendamustine D1,2 q28d x 6 cycles        INTERVAL HISTORY:   Alejandro [name] is a 82 y.o. male presenting to clinic today for follow up of 3A follicular lymphoma. He was last seen by me on 02/04/23.  Since his last visit, he presented to the ED on 02/14/23 with progressive SOB and BLE edema. 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 He was started on Entresto in the hospital and discharged on Lasix.  Today, he states that he is doing well overall. His appetite level is at 100%. His energy level is at 10%.  PAST MEDICAL HISTORY:   Past Medical History: Past Medical History:  Diagnosis Date   A-fib ([place])    AAA Ultrasound 01/11/2020    AAA [place] 01/11/20:  no abdominal aortic aneurysm, 2.4 cm   Aortic insufficiency    a. mild-mod by intraop TEE 05/2018 (no aortic stenosis) // Echo 2/21: mild //  Echo 9/22: trivial   Aortic stenosis    Echo 9/22: mild (mean 16) // Echo 2/21: mild (mean 18.7)   CAD (coronary artery disease)    a. NSTEMI 05/2018 with multivessel disease -> s/p CABGx4 05/11/18 // Myoview 06/07/20: inf scar w mild peri-infarct ischemia, EF 37; Int Risk   Cancer (HCC)    Carotid artery disease ([place])    PreCABG [place] 05/07/18: Bilat ICA 1-39   CKD (chronic kidney disease), stage III ([place])    HFimpEF (heart failure with improved EF)    Echocardiogram 01/11/20: EF 45,  Gr 3 DD, mild AI, mild AS (mean 18.7 mmHg) // Echocardiogram 08/14/21:  EF 55-60, inf HK, mild LVH, Gr 1 DD, normal RVSF, RVSP 25.3, mild LAE, trivial MR, MAC, trivial AI, mild AS (mean 16 mmHg, Vmax 283 cm/s, DI 0.41)   History of nephrolithiasis 2004   Hyperlipidemia    Hypertension    IBS (irritable colon syndrome)    Ischemic cardiomyopathy    Mitral regurgitation    Echo 9/22: trivial MR   Postoperative atrial fibrillation (HCC)    PVC's (premature ventricular contractions)    Reflux    Type 2 diabetes mellitus ([place])     Surgical History: Past Surgical History:  Procedure Laterality Date   BRONCHIAL NEEDLE ASPIRATION BIOPSY  11/17/2022   Procedure: BRONCHIAL NEEDLE ASPIRATION BIOPSIES;  Surgeon: Collene Gobble, MD;  Location: [facility];  Service: Cardiopulmonary;;   CARDIOVERSION     CORONARY ARTERY BYPASS GRAFT N/A 05/11/2018   Procedure: CORONARY ARTERY BYPASS GRAFTING (CABG), on pump, times four, using left internal mammary artery and endoscopically harvested  right greater saphenous leg vein.;  Surgeon: Ivin Poot, MD;  Location: [facility];  Service: Open Heart Surgery;  Laterality: N/A;   ESOPHAGOGASTRODUODENOSCOPY  02/04/2012   Procedure: ESOPHAGOGASTRODUODENOSCOPY (EGD);  Surgeon: Rogene Houston, MD;  [facility];  Service: Endoscopy;  Laterality: N/A;  100   INGUINAL LYMPH NODE BIOPSY Left 12/15/2022   Procedure: INGUINAL LYMPH NODE BIOPSY;  Surgeon: Rusty Aus, DO;  Location: [facility];  Service: General;  Laterality: Left;   LEFT HEART CATH AND CORONARY ANGIOGRAPHY N/A 05/04/2018   Procedure: LEFT HEART CATH AND CORONARY ANGIOGRAPHY;  Surgeon: Belva Crome, MD;  Location: [facility];  Service: Cardiovascular;  Laterality: N/A;   NASAL FRACTURE SURGERY     PORTACATH PLACEMENT Right 01/06/2023   Procedure: INSERTION PORT-A-CATH;  Surgeon: Rusty Aus, DO;  Location: [facility];  Service: General;  Laterality: Right;   TEE WITHOUT  CARDIOVERSION N/A 05/11/2018   Procedure: TRANSESOPHAGEAL ECHOCARDIOGRAM (TEE);  Surgeon: Prescott Gum, Collier Salina, MD;  Location: [facility];  Service: Open Heart Surgery;  Laterality: N/A;   VIDEO BRONCHOSCOPY WITH ENDOBRONCHIAL ULTRASOUND Right 11/17/2022   Procedure: VIDEO BRONCHOSCOPY WITH ENDOBRONCHIAL ULTRASOUND;  Surgeon: Collene Gobble, MD;  Location: [facility];  Service: Cardiopulmonary;  Laterality: Right;    Social History: Social History   Socioeconomic History   Marital status: Widowed    Spouse name: Not on file   Number of children: Not on file   Years of education: Not on file   Highest education level: Not on file  Occupational History   Not on file  Tobacco Use   Smoking status: Never   Smokeless tobacco: Never  Vaping Use   Vaping Use: Never used  Substance and Sexual Activity   Alcohol use: No    Alcohol/week: 0.0 standard drinks of alcohol   Drug use: No   Sexual activity: Yes  Other Topics Concern   Not on file  Social History Narrative   Not on file   Social Determinants of Health   Financial Resource Strain: Not on file  Food Insecurity: Not on file  Transportation Needs: Not on file  Physical Activity: Not on file  Stress: Not on file  Social Connections: Not on file  Intimate Partner Violence: Not on file    Family History: Family History  Problem Relation Age of Onset   Diabetes Mellitus II Mother    Heart attack Father    Heart attack Brother    Heart disease Other    Arthritis Other    Asthma Other    Diabetes Other     Current Medications:  Current Outpatient Medications:    acetaminophen (TYLENOL) 500 MG tablet, Take 1,000 mg by mouth every 6 (six) hours as needed for moderate pain or mild pain., Disp: , Rfl:    albuterol (VENTOLIN HFA) 108 (90 Base) MCG/ACT inhaler, Inhale 2 puffs into the lungs every 6 (six) hours as needed for wheezing or shortness of breath., Disp: , Rfl:    allopurinol (ZYLOPRIM) 300 MG tablet, Take 1 tablet (300  mg total) by mouth daily., Disp: 30 tablet, Rfl: 3   apixaban (ELIQUIS) 5 MG TABS tablet, Take 1 tablet (5 mg total) by mouth 2 (two) times daily., Disp: 60 tablet, Rfl: 5   atorvastatin (LIPITOR) 40 MG tablet, Take 40 mg by mouth at bedtime., Disp: , Rfl:    cyanocobalamin (VITAMIN B12) 1000 MCG tablet, Take 1,000 mcg by mouth daily., Disp: , Rfl:  cyanocobalamin (VITAMIN B12) 1000 MCG/ML injection, Inject 1,000 mcg into the skin every 30 (thirty) days., Disp: , Rfl:    docusate sodium (COLACE) 100 MG capsule, Take 1 capsule (100 mg total) by mouth 2 (two) times daily. (Patient taking differently: Take 100 mg by mouth 2 (two) times daily as needed for mild constipation or moderate constipation.), Disp: 60 capsule, Rfl: 2   famotidine (PEPCID) 20 MG tablet, Take 1 tablet (20 mg total) by mouth 2 (two) times daily., Disp: , Rfl:    ferrous sulfate 325 (65 FE) MG EC tablet, Take 1 tablet (325 mg total) by mouth daily with breakfast., Disp: 100 tablet, Rfl: 1   fluticasone (FLONASE) 50 MCG/ACT nasal spray, Place 1 spray into both nostrils daily., Disp: 11.1 mL, Rfl: 2   furosemide (LASIX) 80 MG tablet, Take 1 tablet (80 mg total) by mouth daily., Disp: 30 tablet, Rfl: 2   glipiZIDE (GLUCOTROL XL) 10 MG 24 hr tablet, Take 10 mg by mouth daily., Disp: , Rfl:    loratadine (CLARITIN) 10 MG tablet, Take 10 mg by mouth daily., Disp: , Rfl:    Melatonin 10 MG CAPS, Take 30 mg by mouth at bedtime as needed (sleep)., Disp: , Rfl:    metFORMIN (GLUCOPHAGE-XR) 750 MG 24 hr tablet, Take 1 tablet by mouth 2 (two) times daily with a meal., Disp: , Rfl:    oxyCODONE (ROXICODONE) 5 MG immediate release tablet, Take 1 tablet (5 mg total) by mouth every 6 (six) hours as needed. (Patient taking differently: Take 5 mg by mouth every 6 (six) hours as needed for severe pain or moderate pain.), Disp: 12 tablet, Rfl: 0   pantoprazole (PROTONIX) 40 MG tablet, Take 40 mg by mouth daily., Disp: , Rfl:    polyethylene glycol  (MIRALAX / GLYCOLAX) 17 g packet, Take 17 g by mouth daily. (Patient taking differently: Take 17 g by mouth daily as needed for moderate constipation.), Disp: 30 each, Rfl: 0   potassium chloride (MICRO-K) 10 MEQ CR capsule, Take 10 mEq by mouth daily., Disp: , Rfl:    potassium chloride SA (KLOR-CON M) 20 MEQ tablet, Take 1 tablet (20 mEq total) by mouth daily., Disp: 30 tablet, Rfl: 2   Povidone, PF, (IVIZIA DRY EYES) 0.5 % SOLN, Place 1 drop into both eyes daily as needed (dry eyes)., Disp: , Rfl:    prochlorperazine (COMPAZINE) 10 MG tablet, Take 1 tablet (10 mg total) by mouth every 6 (six) hours as needed for nausea or vomiting., Disp: 60 tablet, Rfl: 1   sacubitril-valsartan (ENTRESTO) 49-51 MG, Take 1 tablet by mouth 2 (two) times daily., Disp: 60 tablet, Rfl: 2   sodium chloride (OCEAN) 0.65 % SOLN nasal spray, Place 1 spray into both nostrils as needed for congestion., Disp: 44 mL, Rfl: 2   spironolactone (ALDACTONE) 25 MG tablet, Take 0.5 tablets (12.5 mg total) by mouth daily., Disp: 45 tablet, Rfl: 3 No current facility-administered medications for this visit.  Facility-Administered Medications Ordered in Other Visits:    sodium chloride flush (NS) 0.9 % injection 10 mL, 10 mL, Intracatheter, PRN, Derek Jack, MD, 10 mL at 01/07/23 1446   sodium chloride flush (NS) 0.9 % injection 10 mL, 10 mL, Intracatheter, PRN, Derek Jack, MD, 10 mL at 03/04/23 1401   Allergies: No Known Allergies  REVIEW OF SYSTEMS:   Review of Systems  Constitutional:  Positive for fatigue. Negative for chills and fever.  HENT:   Negative for lump/mass, mouth sores, nosebleeds, sore  throat and trouble swallowing.   Eyes:  Negative for eye problems.  Respiratory:  Negative for cough and shortness of breath.   Cardiovascular:  Negative for chest pain, leg swelling and palpitations.  Gastrointestinal:  Positive for constipation. Negative for abdominal pain, diarrhea, nausea and vomiting.   Genitourinary:  Negative for bladder incontinence, difficulty urinating, dysuria, frequency, hematuria and nocturia.   Musculoskeletal:  Negative for arthralgias, back pain, flank pain, myalgias and neck pain.  Skin:  Negative for itching and rash.  Neurological:  Negative for dizziness, headaches and numbness.  Hematological:  Does not bruise/bleed easily.  Psychiatric/Behavioral:  Negative for depression, sleep disturbance and suicidal ideas. The patient is not nervous/anxious.   All other systems reviewed and are negative.    VITALS:   There were no vitals taken for this visit.  Wt Readings from Last 3 Encounters:  03/04/23 225 lb 9.6 oz (102.3 kg)  02/24/23 227 lb 3.2 oz (103.1 kg)  02/16/23 230 lb 2.6 oz (104.4 kg)    There is no height or weight on file to calculate BMI.  Performance status (ECOG): 1 - Symptomatic but completely ambulatory  PHYSICAL EXAM:   Physical Exam Vitals and nursing note reviewed. Exam conducted with a chaperone present.  Constitutional:      Appearance: Normal appearance.  Cardiovascular:     Rate and Rhythm: Normal rate and regular rhythm.     Pulses: Normal pulses.     Heart sounds: Normal heart sounds.  Pulmonary:     Effort: Pulmonary effort is normal.     Breath sounds: Normal breath sounds.  Abdominal:     Palpations: Abdomen is soft. There is no hepatomegaly, splenomegaly or mass.     Tenderness: There is no abdominal tenderness.  Musculoskeletal:     Right lower leg: No edema.     Left lower leg: No edema.  Lymphadenopathy:     Cervical: No cervical adenopathy.     Right cervical: No superficial, deep or posterior cervical adenopathy.    Left cervical: No superficial, deep or posterior cervical adenopathy.     Upper Body:     Right upper body: No supraclavicular or axillary adenopathy.     Left upper body: No supraclavicular or axillary adenopathy.  Neurological:     General: No focal deficit present.     Mental Status: He is  alert and oriented to person, place, and time.  Psychiatric:        Mood and Affect: Mood normal.        Behavior: Behavior normal.     LABS:      Latest Ref Rng & Units 03/04/2023    7:39 AM 02/16/2023   12:36 AM 02/15/2023    4:40 AM  CBC  WBC 4.0 - 10.5 K/uL 7.9  19.6  22.9   Hemoglobin 13.0 - 17.0 g/dL 12.7  12.4  11.7   Hematocrit 39.0 - 52.0 % 37.7  37.0  35.7   Platelets 150 - 400 K/uL 291  183  171       Latest Ref Rng & Units 03/04/2023    7:39 AM 02/17/2023    4:06 AM 02/16/2023    5:08 PM  CMP  Glucose 70 - 99 mg/dL 233  192  171   BUN 8 - 23 mg/dL 16  16  15    Creatinine 0.61 - 1.24 mg/dL 1.16  0.99  1.16   Sodium 135 - 145 mmol/L 131  135  135   Potassium  3.5 - 5.1 mmol/L 3.9  3.8  4.2   Chloride 98 - 111 mmol/L 99  102  100   CO2 22 - 32 mmol/L 22  24  26    Calcium 8.9 - 10.3 mg/dL 8.6  8.2  8.3   Total Protein 6.5 - 8.1 g/dL 6.9     Total Bilirubin 0.3 - 1.2 mg/dL 0.6     Alkaline Phos 38 - 126 U/L 95     AST 15 - 41 U/L 20     ALT 0 - 44 U/L 19        No results found for: "CEA1", "CEA" / No results found for: "CEA1", "CEA" No results found for: "PSA1" No results found for: "[id number]" No results found for: "CAN125"  No results found for: "TOTALPROTELP", "ALBUMINELP", "A1GS", "A2GS", "BETS", "BETA2SER", "GAMS", "MSPIKE", "SPEI" Lab Results  Component Value Date   TIBC 361 02/04/2023   FERRITIN 47 02/04/2023   IRONPCTSAT 13 (L) 02/04/2023   Lab Results  Component Value Date   LDH 128 03/04/2023   LDH 118 02/04/2023   LDH 146 01/19/2023     STUDIES:   ECHOCARDIOGRAM COMPLETE  Result Date: 02/15/2023    ECHOCARDIOGRAM REPORT   Patient Name:   Alejandro [name] Date of Exam: 02/15/2023 Medical Rec #:  [id number]      Height:       71.0 in Accession #:    [id number]     Weight:       235.0 lb Date of Birth:  30-Jun-1941       BSA:          2.258 m Patient Age:    82 years       BP:           171/69 mmHg Patient Gender: M              HR:           67 bpm.  Exam Location:  [facility] Procedure: 2D Echo, Cardiac Doppler and Color Doppler Indications:    Dyspnea R06.00  History:        Patient has prior history of Echocardiogram examinations, most                 recent 09/19/2022. CHF and Cardiomyopathy, CAD and Previous                 Myocardial Infarction, Prior CABG, Arrythmias:Atrial                 Fibrillation and PVC; Risk Factors:Hypertension, Dyslipidemia                 and Diabetes. Hx of (HFimpEF) heart failure with improved EF.  Sonographer:    Alvino Chapel RCS Referring Phys: [id number] [place]  1. Left ventricular ejection fraction, by estimation, is 45 to 50%. The left ventricle has mildly decreased function. The left ventricle demonstrates regional wall motion abnormalities (see scoring diagram/findings for description). The left ventricular  internal cavity size was moderately dilated. Left ventricular diastolic parameters are consistent with Grade II diastolic dysfunction (pseudonormalization).  2. Right ventricular systolic function is normal. The right ventricular size is mildly enlarged. There is mildly elevated pulmonary artery systolic pressure.  3. Left atrial size was severely dilated.  4. Right atrial size was mildly dilated.  5. The mitral valve is normal in structure. No evidence of mitral valve regurgitation. No evidence of mitral stenosis.  6. The aortic valve  is calcified. There is moderate calcification of the aortic valve. There is moderate thickening of the aortic valve. Aortic valve regurgitation is not visualized. Moderate aortic valve stenosis. Aortic valve mean gradient measures 19.0 mmHg. Aortic valve Vmax measures 2.97 m/s.  7. Aortic dilatation noted. There is mild dilatation of the aortic root, measuring 43 mm.  8. The inferior vena cava is normal in size with greater than 50% respiratory variability, suggesting right atrial pressure of 3 mmHg. Comparison(s): No significant change from prior study.  Prior images reviewed side by side. FINDINGS  Left Ventricle: Left ventricular ejection fraction, by estimation, is 45 to 50%. The left ventricle has mildly decreased function. The left ventricle demonstrates regional wall motion abnormalities. The left ventricular internal cavity size was moderately dilated. There is no left ventricular hypertrophy. Left ventricular diastolic parameters are consistent with Grade II diastolic dysfunction (pseudonormalization).  LV Wall Scoring: The basal inferior segment and basal inferoseptal segment are akinetic. Right Ventricle: The right ventricular size is mildly enlarged. No increase in right ventricular wall thickness. Right ventricular systolic function is normal. There is mildly elevated pulmonary artery systolic pressure. The tricuspid regurgitant velocity is 3.20 m/s, and with an assumed right atrial pressure of 3 mmHg, the estimated right ventricular systolic pressure is 123XX123 mmHg. Left Atrium: Left atrial size was severely dilated. Right Atrium: Right atrial size was mildly dilated. Pericardium: There is no evidence of pericardial effusion. Mitral Valve: The mitral valve is normal in structure. No evidence of mitral valve regurgitation. No evidence of mitral valve stenosis. Tricuspid Valve: The tricuspid valve is normal in structure. Tricuspid valve regurgitation is mild . No evidence of tricuspid stenosis. Aortic Valve: The aortic valve is calcified. There is moderate calcification of the aortic valve. There is moderate thickening of the aortic valve. Aortic valve regurgitation is not visualized. Moderate aortic stenosis is present. Aortic valve mean gradient measures 19.0 mmHg. Aortic valve peak gradient measures 35.3 mmHg. Aortic valve area, by VTI measures 1.91 cm. Pulmonic Valve: The pulmonic valve was normal in structure. Pulmonic valve regurgitation is mild. No evidence of pulmonic stenosis. Aorta: Aortic dilatation noted. There is mild dilatation of the aortic  root, measuring 43 mm. Venous: The inferior vena cava is normal in size with greater than 50% respiratory variability, suggesting right atrial pressure of 3 mmHg. IAS/Shunts: No atrial level shunt detected by color flow Doppler.  LEFT VENTRICLE PLAX 2D LVIDd:         6.40 cm   Diastology LVIDs:         4.70 cm   LV e' medial:    7.07 cm/s LV PW:         1.40 cm   LV E/e' medial:  20.2 LV IVS:        1.10 cm   LV e' lateral:   10.80 cm/s LVOT diam:     2.50 cm   LV E/e' lateral: 13.2 LV SV:         130 LV SV Index:   57 LVOT Area:     4.91 cm  RIGHT VENTRICLE RV S prime:     12.30 cm/s TAPSE (M-mode): 2.1 cm LEFT ATRIUM              Index        RIGHT ATRIUM           Index LA diam:        5.70 cm  2.52 cm/m   RA Area:  26.10 cm LA Vol (A2C):   96.6 ml  42.78 ml/m  RA Volume:   80.30 ml  35.56 ml/m LA Vol (A4C):   124.0 ml 54.91 ml/m LA Biplane Vol: 115.0 ml 50.93 ml/m  AORTIC VALVE AV Area (Vmax):    1.69 cm AV Area (Vmean):   1.69 cm AV Area (VTI):     1.91 cm AV Vmax:           297.00 cm/s AV Vmean:          201.000 cm/s AV VTI:            0.680 m AV Peak Grad:      35.3 mmHg AV Mean Grad:      19.0 mmHg LVOT Vmax:         102.00 cm/s LVOT Vmean:        69.100 cm/s LVOT VTI:          0.264 m LVOT/AV VTI ratio: 0.39  AORTA Ao Root diam: 4.30 cm MITRAL VALVE                TRICUSPID VALVE MV Area (PHT): 5.27 cm     TR Peak grad:   41.0 mmHg MV Decel Time: 144 msec     TR Vmax:        320.00 cm/s MV E velocity: 143.00 cm/s MV A velocity: 73.80 cm/s   SHUNTS MV E/A ratio:  1.94         Systemic VTI:  0.26 m                             Systemic Diam: 2.50 cm Candee Furbish MD Electronically signed by Candee Furbish MD Signature Date/Time: 02/15/2023/12:29:11 PM    Final    DG Chest 1 View  Result Date: 02/15/2023 CLINICAL DATA:  Dyspnea, chest pain EXAM: CHEST  1 VIEW COMPARISON:  02/14/2023 FINDINGS: The lungs are symmetrically well expanded. Superimposed mild interstitial pulmonary infiltrates appears  improved in the interval, suggesting improving pulmonary edema given its relatively rapid resolution. No pneumothorax or pleural effusion. Right internal jugular chest port tip noted in the expected innominate vein. Coronary artery bypass grafting has been performed. Mild cardiomegaly is stable. IMPRESSION: 1. Interval improvement in pulmonary edema. 2. Stable mild cardiomegaly. Electronically Signed   By: Fidela Salisbury M.D.   On: 02/15/2023 01:10   DG Chest Port 1 View  Result Date: 02/14/2023 CLINICAL DATA:  Shortness of breath, chest pain EXAM: PORTABLE CHEST 1 VIEW COMPARISON:  Chest radiograph dated 01/06/2023. FINDINGS: The heart is enlarged. Vascular calcifications are seen in the aortic arch. There is mild diffuse bilateral interstitial opacities. Trace pleural effusions are difficult to exclude. There is no pneumothorax. A right internal jugular central venous port catheter tip overlies the superior vena cava or brachiocephalic vein. Degenerative changes are seen in the spine. IMPRESSION: Cardiomegaly with mild diffuse bilateral interstitial opacities, which may reflect pulmonary edema or atypical infection. Electronically Signed   By: Zerita Boers M.D.   On: 02/14/2023 19:03

## 2023-03-04 ENCOUNTER — Inpatient Hospital Stay: Payer: Medicare Other

## 2023-03-04 ENCOUNTER — Inpatient Hospital Stay (HOSPITAL_BASED_OUTPATIENT_CLINIC_OR_DEPARTMENT_OTHER): Payer: Medicare Other | Admitting: Hematology

## 2023-03-04 ENCOUNTER — Inpatient Hospital Stay: Payer: Medicare Other | Attending: Hematology

## 2023-03-04 VITALS — BP 119/57 | HR 62 | Temp 96.6°F | Resp 18

## 2023-03-04 DIAGNOSIS — C8238 Follicular lymphoma grade IIIa, lymph nodes of multiple sites: Secondary | ICD-10-CM

## 2023-03-04 DIAGNOSIS — J8410 Pulmonary fibrosis, unspecified: Secondary | ICD-10-CM | POA: Diagnosis not present

## 2023-03-04 DIAGNOSIS — R490 Dysphonia: Secondary | ICD-10-CM | POA: Insufficient documentation

## 2023-03-04 DIAGNOSIS — C8230 Follicular lymphoma grade IIIa, unspecified site: Secondary | ICD-10-CM | POA: Insufficient documentation

## 2023-03-04 DIAGNOSIS — Z79899 Other long term (current) drug therapy: Secondary | ICD-10-CM | POA: Diagnosis not present

## 2023-03-04 DIAGNOSIS — Z5111 Encounter for antineoplastic chemotherapy: Secondary | ICD-10-CM | POA: Insufficient documentation

## 2023-03-04 DIAGNOSIS — R739 Hyperglycemia, unspecified: Secondary | ICD-10-CM

## 2023-03-04 LAB — COMPREHENSIVE METABOLIC PANEL
ALT: 19 U/L (ref 0–44)
AST: 20 U/L (ref 15–41)
Albumin: 3.8 g/dL (ref 3.5–5.0)
Alkaline Phosphatase: 95 U/L (ref 38–126)
Anion gap: 10 (ref 5–15)
BUN: 16 mg/dL (ref 8–23)
CO2: 22 mmol/L (ref 22–32)
Calcium: 8.6 mg/dL — ABNORMAL LOW (ref 8.9–10.3)
Chloride: 99 mmol/L (ref 98–111)
Creatinine, Ser: 1.16 mg/dL (ref 0.61–1.24)
GFR, Estimated: >60 mL/min (ref >60)
Glucose, Bld: 233 mg/dL — ABNORMAL HIGH (ref 70–99)
Potassium: 3.9 mmol/L (ref 3.5–5.1)
Sodium: 131 mmol/L — ABNORMAL LOW (ref 135–145)
Total Bilirubin: 0.6 mg/dL (ref 0.3–1.2)
Total Protein: 6.9 g/dL (ref 6.5–8.1)

## 2023-03-04 LAB — CBC WITH DIFFERENTIAL/PLATELET
Abs Immature Granulocytes: 0.05 10*3/uL (ref 0.00–0.07)
Basophils Absolute: 0 10*3/uL (ref 0.0–0.1)
Basophils Relative: 0 %
Eosinophils Absolute: 0.1 10*3/uL (ref 0.0–0.5)
Eosinophils Relative: 1 %
HCT: 37.7 % — ABNORMAL LOW (ref 39.0–52.0)
Hemoglobin: 12.7 g/dL — ABNORMAL LOW (ref 13.0–17.0)
Immature Granulocytes: 1 %
Lymphocytes Relative: 23 %
Lymphs Abs: 1.8 10*3/uL (ref 0.7–4.0)
MCH: 29.8 pg (ref 26.0–34.0)
MCHC: 33.7 g/dL (ref 30.0–36.0)
MCV: 88.5 fL (ref 80.0–100.0)
Monocytes Absolute: 1.3 10*3/uL — ABNORMAL HIGH (ref 0.1–1.0)
Monocytes Relative: 16 %
Neutro Abs: 4.7 10*3/uL (ref 1.7–7.7)
Neutrophils Relative %: 59 %
Platelets: 291 10*3/uL (ref 150–400)
RBC: 4.26 MIL/uL (ref 4.22–5.81)
RDW: 14.6 % (ref 11.5–15.5)
WBC: 7.9 10*3/uL (ref 4.0–10.5)
nRBC: 0 % (ref 0.0–0.2)

## 2023-03-04 LAB — LACTATE DEHYDROGENASE: LDH: 128 U/L (ref 98–192)

## 2023-03-04 LAB — URIC ACID: Uric Acid, Serum: 4.8 mg/dL (ref 3.7–8.6)

## 2023-03-04 LAB — MAGNESIUM: Magnesium: 1.8 mg/dL (ref 1.7–2.4)

## 2023-03-04 MED ORDER — ACETAMINOPHEN 325 MG PO TABS
650.0000 mg | ORAL_TABLET | Freq: Once | ORAL | Status: AC
  Administered 2023-03-04: 650 mg via ORAL
  Filled 2023-03-04: qty 2

## 2023-03-04 MED ORDER — SODIUM CHLORIDE 0.9 % IV SOLN
60.0000 mg/m2 | Freq: Once | INTRAVENOUS | Status: AC
  Administered 2023-03-04: 140 mg via INTRAVENOUS
  Filled 2023-03-04: qty 5.6

## 2023-03-04 MED ORDER — SODIUM CHLORIDE 0.9 % IV SOLN
375.0000 mg/m2 | Freq: Once | INTRAVENOUS | Status: AC
  Administered 2023-03-04: 900 mg via INTRAVENOUS
  Filled 2023-03-04: qty 90

## 2023-03-04 MED ORDER — PALONOSETRON HCL INJECTION 0.25 MG/5ML
0.2500 mg | Freq: Once | INTRAVENOUS | Status: AC
  Administered 2023-03-04: 0.25 mg via INTRAVENOUS
  Filled 2023-03-04: qty 5

## 2023-03-04 MED ORDER — CETIRIZINE HCL 10 MG/ML IV SOLN
10.0000 mg | Freq: Once | INTRAVENOUS | Status: AC
  Administered 2023-03-04: 10 mg via INTRAVENOUS
  Filled 2023-03-04: qty 1

## 2023-03-04 MED ORDER — SODIUM CHLORIDE 0.9 % IV SOLN
10.0000 mg | Freq: Once | INTRAVENOUS | Status: AC
  Administered 2023-03-04: 10 mg via INTRAVENOUS
  Filled 2023-03-04: qty 10

## 2023-03-04 MED ORDER — FAMOTIDINE IN NACL 20-0.9 MG/50ML-% IV SOLN
20.0000 mg | Freq: Once | INTRAVENOUS | Status: AC
  Administered 2023-03-04: 20 mg via INTRAVENOUS
  Filled 2023-03-04: qty 50

## 2023-03-04 MED ORDER — HEPARIN SOD (PORK) LOCK FLUSH 100 UNIT/ML IV SOLN
500.0000 [IU] | Freq: Once | INTRAVENOUS | Status: AC | PRN
  Administered 2023-03-04: 500 [IU]

## 2023-03-04 MED ORDER — INSULIN ASPART 100 UNIT/ML IJ SOLN
10.0000 [IU] | Freq: Once | INTRAMUSCULAR | Status: AC
  Administered 2023-03-04: 10 [IU] via SUBCUTANEOUS
  Filled 2023-03-04: qty 0.1

## 2023-03-04 MED ORDER — SODIUM CHLORIDE 0.9 % IV SOLN: Freq: Once | INTRAVENOUS | Status: AC

## 2023-03-04 MED ORDER — SODIUM CHLORIDE 0.9% FLUSH
10.0000 mL | INTRAVENOUS | Status: DC | PRN
  Administered 2023-03-04: 10 mL

## 2023-03-04 NOTE — Progress Notes (Signed)
Patient has been examined by Dr. Katragadda. Vital signs and labs have been reviewed by MD - ANC, Creatinine, LFTs, hemoglobin, and platelets are within treatment parameters per M.D. - pt may proceed with treatment.  Primary RN and pharmacy notified.  

## 2023-03-04 NOTE — Patient Instructions (Signed)
[facility]  Discharge Instructions: Thank you for choosing [facility] to provide your oncology and hematology care.  If you have a lab appointment with the [facility] - please note that after April 8th, 2024, all labs will be drawn in the cancer center.  You do not have to check in or register with the main entrance as you have in the past but will complete your check-in in the cancer center.  Wear comfortable clothing and clothing appropriate for easy access to any Portacath or PICC line.   We strive to give you quality time with your provider. You may need to reschedule your appointment if you arrive late (15 or more minutes).  Arriving late affects you and other patients whose appointments are after yours.  Also, if you miss three or more appointments without notifying the office, you may be dismissed from the clinic at the provider's discretion.      For prescription refill requests, have your pharmacy contact our office and allow 72 hours for refills to be completed.    Today you received the following chemotherapy and/or immunotherapy agents Rituximab and Bendeka   To help prevent nausea and vomiting after your treatment, we encourage you to take your nausea medication as directed.  Rituximab Injection What is this medication? RITUXIMAB (ri TUX i mab) treats leukemia and lymphoma. It works by blocking a protein that causes cancer cells to grow and multiply. This helps to slow or stop the spread of cancer cells. It may also be used to treat autoimmune conditions, such as arthritis. It works by slowing down an overactive immune system. It is a monoclonal antibody. This medicine may be used for other purposes; ask your health care provider or pharmacist if you have questions. COMMON BRAND NAME(S): RIABNI, Rituxan, RUXIENCE, truxima What should I tell my care team before I take this medication? They need to know if you have any of these  conditions: Chest pain Heart disease Immune system problems Infection, such as chickenpox, cold sores, hepatitis B, herpes Irregular heartbeat or rhythm Kidney disease Low blood counts, such as low white cells, platelets, red cells Lung disease Recent or upcoming vaccine An unusual or allergic reaction to rituximab, other medications, foods, dyes, or preservatives Pregnant or trying to get pregnant Breast-feeding How should I use this medication? This medication is injected into a vein. It is given by a care team in a hospital or clinic setting. A special MedGuide will be given to you before each treatment. Be sure to read this information carefully each time. Talk to your care team about the use of this medication in children. While this medication may be prescribed for children as young as 6 months for selected conditions, precautions do apply. Overdosage: If you think you have taken too much of this medicine contact a poison control center or emergency room at once. NOTE: This medicine is only for you. Do not share this medicine with others. What if I miss a dose? Keep appointments for follow-up doses. It is important not to miss your dose. Call your care team if you are unable to keep an appointment. What may interact with this medication? Do not take this medication with any of the following: Live vaccines This medication may also interact with the following: Cisplatin This list may not describe all possible interactions. Give your health care provider a list of all the medicines, herbs, non-prescription drugs, or dietary supplements you use. Also tell them if you  smoke, drink alcohol, or use illegal drugs. Some items may interact with your medicine. What should I watch for while using this medication? Your condition will be monitored carefully while you are receiving this medication. You may need blood work while taking this medication. This medication can cause serious infusion  reactions. To reduce the risk your care team may give you other medications to take before receiving this one. Be sure to follow the directions from your care team. This medication may increase your risk of getting an infection. Call your care team for advice if you get a fever, chills, sore throat, or other symptoms of a cold or flu. Do not treat yourself. Try to avoid being around people who are sick. Call your care team if you are around anyone with measles, chickenpox, or if you develop sores or blisters that do not heal properly. Avoid taking medications that contain aspirin, acetaminophen, ibuprofen, naproxen, or ketoprofen unless instructed by your care team. These medications may hide a fever. This medication may cause serious skin reactions. They can happen weeks to months after starting the medication. Contact your care team right away if you notice fevers or flu-like symptoms with a rash. The rash may be red or purple and then turn into blisters or peeling of the skin. You may also notice a red rash with swelling of the face, lips, or lymph nodes in your neck or under your arms. In some patients, this medication may cause a serious brain infection that may cause death. If you have any problems seeing, thinking, speaking, walking, or standing, tell your care team right away. If you cannot reach your care team, urgently seek another source of medical care. Talk to your care team if you may be pregnant. Serious birth defects can occur if you take this medication during pregnancy and for 12 months after the last dose. You will need a negative pregnancy test before starting this medication. Contraception is recommended while taking this medication and for 12 months after the last dose. Your care team can help you find the option that works for you. Do not breastfeed while taking this medication and for at least 6 months after the last dose. What side effects may I notice from receiving this  medication? Side effects that you should report to your care team as soon as possible: Allergic reactions or angioedema--skin rash, itching or hives, swelling of the face, eyes, lips, tongue, arms, or legs, trouble swallowing or breathing Bowel blockage--stomach cramping, unable to have a bowel movement or pass gas, loss of appetite, vomiting Dizziness, loss of balance or coordination, confusion or trouble speaking Heart attack--pain or tightness in the chest, shoulders, arms, or jaw, nausea, shortness of breath, cold or clammy skin, feeling faint or lightheaded Heart rhythm changes--fast or irregular heartbeat, dizziness, feeling faint or lightheaded, chest pain, trouble breathing Infection--fever, chills, cough, sore throat, wounds that don't heal, pain or trouble when passing urine, general feeling of discomfort or being unwell Infusion reactions--chest pain, shortness of breath or trouble breathing, feeling faint or lightheaded Kidney injury--decrease in the amount of urine, swelling of the ankles, hands, or feet Liver injury--right upper belly pain, loss of appetite, nausea, light-colored stool, dark yellow or brown urine, yellowing skin or eyes, unusual weakness or fatigue Redness, blistering, peeling, or loosening of the skin, including inside the mouth Stomach pain that is severe, does not go away, or gets worse Tumor lysis syndrome (TLS)--nausea, vomiting, diarrhea, decrease in the amount of urine, dark urine, unusual  weakness or fatigue, confusion, muscle pain or cramps, fast or irregular heartbeat, joint pain Side effects that usually do not require medical attention (report to your care team if they continue or are bothersome): Headache Joint pain Nausea Runny or stuffy nose Unusual weakness or fatigue This list may not describe all possible side effects. Call your doctor for medical advice about side effects. You may report side effects to FDA at 1-800-FDA-1088. Where should I keep  my medication? This medication is given in a hospital or clinic. It will not be stored at home. NOTE: This sheet is a summary. It may not cover all possible information. If you have questions about this medicine, talk to your doctor, pharmacist, or health care provider.  2023 Elsevier/Gold Standard (2022-04-01 00:00:00)  Bendamustine Injection What is this medication? BENDAMUSTINE (BEN da MUS teen) treats leukemia and lymphoma. It works by slowing down the growth of cancer cells. This medicine may be used for other purposes; ask your health care provider or pharmacist if you have questions. COMMON BRAND NAME(S): Oren Beckmann, VIVIMUSTA What should I tell my care team before I take this medication? They need to know if you have any of these conditions: Infection, especially a viral infection, such as chickenpox, cold sores, herpes Kidney disease Liver disease An unusual or allergic reaction to bendamustine, mannitol, other medications, foods, dyes, or preservatives Pregnant or trying to get pregnant Breast-feeding How should I use this medication? This medication is injected into a vein. It is given by your care team in a hospital or clinic setting. Talk to your care team about the use of this medication in children. Special care may be needed. Overdosage: If you think you have taken too much of this medicine contact a poison control center or emergency room at once. NOTE: This medicine is only for you. Do not share this medicine with others. What if I miss a dose? Keep appointments for follow-up doses. It is important not to miss your dose. Call your care team if you are unable to keep an appointment. What may interact with this medication? Do not take this medication with any of the following: Clozapine This medication may also interact with the following: Atazanavir Cimetidine Ciprofloxacin Enoxacin Fluvoxamine Medications for seizures, such as carbamazepine,  phenobarbital Mexiletine Rifampin Tacrine Thiabendazole Zileuton This list may not describe all possible interactions. Give your health care provider a list of all the medicines, herbs, non-prescription drugs, or dietary supplements you use. Also tell them if you smoke, drink alcohol, or use illegal drugs. Some items may interact with your medicine. What should I watch for while using this medication? Visit your care team for regular checks on your progress. This medication may make you feel generally unwell. This is not uncommon, as chemotherapy can affect healthy cells as well as cancer cells. Report any side effects. Continue your course of treatment even though you feel ill unless your care team tells you to stop. You may need blood work while taking this medication. This medication may increase your risk of getting an infection. Call your care team for advice if you get a fever, chills, sore throat, or other symptoms of a cold or flu. Do not treat yourself. Try to avoid being around people who are sick. This medication may cause serious skin reactions. They can happen weeks to months after starting the medication. Contact your care team right away if you notice fevers or flu-like symptoms with a rash. The rash may be red or purple  and then turn into blisters or peeling of the skin. You may also notice a red rash with swelling of the face, lips, or lymph nodes in your neck or under your arms. In some patients, this medication may cause a serious brain infection that may cause death. If you have any problems seeing, thinking, speaking, walking, or standing, tell your care team right away. If you cannot reach your care team, urgently seek other source of medical care. This medication may increase your risk to bruise or bleed. Call your care team if you notice any unusual bleeding. Talk to your care team about your risk of cancer. You may be more at risk for certain types of cancer if you take this  medication. Talk to your care team about your risk of skin cancer. You may be more at risk for skin cancer if you take this medication. Talk to your care team if you or your partner wish to become pregnant or think either of you might be pregnant. This medication can cause serious birth defects if taken during pregnancy or for up to 6 months after the last dose. A negative pregnancy test is required before starting this medication. A reliable form of contraception is recommended while taking this medication and for 6 months after the last dose. Talk to your care team about reliable forms of contraception. Wear a condom while taking this medication and for at least 3 months after the last dose. Do not breast-feed while taking this medication or for at least 1 week after the last dose. This medication may cause infertility. Talk to your care team if you are concerned about your fertility. What side effects may I notice from receiving this medication? Side effects that you should report to your care team as soon as possible: Allergic reactions--skin rash, itching, hives, swelling of the face, lips, tongue, or throat Infection--fever, chills, cough, sore throat, wounds that don't heal, pain or trouble when passing urine, general feeling of discomfort or being unwell Infusion reactions--chest pain, shortness of breath or trouble breathing, feeling faint or lightheaded Liver injury--right upper belly pain, loss of appetite, nausea, light-colored stool, dark yellow or brown urine, yellowing skin or eyes, unusual weakness or fatigue Low red blood cell level--unusual weakness or fatigue, dizziness, headache, trouble breathing Painful swelling, warmth, or redness of the skin, blisters or sores at the infusion site Rash, fever, and swollen lymph nodes Redness, blistering, peeling, or loosening of the skin, including inside the mouth Tumor lysis syndrome (TLS)--nausea, vomiting, diarrhea, decrease in the amount  of urine, dark urine, unusual weakness or fatigue, confusion, muscle pain or cramps, fast or irregular heartbeat, joint pain Unusual bruising or bleeding Side effects that usually do not require medical attention (report to your care team if they continue or are bothersome): Diarrhea Fatigue Headache Loss of appetite Nausea Vomiting This list may not describe all possible side effects. Call your doctor for medical advice about side effects. You may report side effects to FDA at 1-800-FDA-1088. Where should I keep my medication? This medication is given in a hospital or clinic. It will not be stored at home. NOTE: This sheet is a summary. It may not cover all possible information. If you have questions about this medicine, talk to your doctor, pharmacist, or health care provider.  2023 Elsevier/Gold Standard (2022-02-24 00:00:00)    BELOW ARE SYMPTOMS THAT SHOULD BE REPORTED IMMEDIATELY: *FEVER GREATER THAN 100.4 F (38 C) OR HIGHER *CHILLS OR SWEATING *NAUSEA AND VOMITING THAT IS NOT  CONTROLLED WITH YOUR NAUSEA MEDICATION *UNUSUAL SHORTNESS OF BREATH *UNUSUAL BRUISING OR BLEEDING *URINARY PROBLEMS (pain or burning when urinating, or frequent urination) *BOWEL PROBLEMS (unusual diarrhea, constipation, pain near the anus) TENDERNESS IN MOUTH AND THROAT WITH OR WITHOUT PRESENCE OF ULCERS (sore throat, sores in mouth, or a toothache) UNUSUAL RASH, SWELLING OR PAIN  UNUSUAL VAGINAL DISCHARGE OR ITCHING   Items with * indicate a potential emergency and should be followed up as soon as possible or go to the Emergency Department if any problems should occur.  Please show the CHEMOTHERAPY ALERT CARD or IMMUNOTHERAPY ALERT CARD at check-in to the Emergency Department and triage nurse.  Should you have questions after your visit or need to cancel or reschedule your appointment, please contact [facility] [phone]  and follow the prompts.  Office hours are 8:00 a.m. to  4:30 p.m. Monday - Friday. Please note that voicemails left after 4:00 p.m. may not be returned until the following business day.  We are closed weekends and major holidays. You have access to a nurse at all times for urgent questions. Please call the main number to the clinic [phone] and follow the prompts.  For any non-urgent questions, you may also contact your provider using MyChart. We now offer e-Visits for anyone 51 and older to request care online for non-urgent symptoms. For details visit [email or web address].   Also download the MyChart app! Go to the app store, search "MyChart", open the app, select , and log in with your MyChart username and password.

## 2023-03-04 NOTE — Patient Instructions (Addendum)
[facility] Discharge Instructions   You were seen and examined today by Dr. Delton Coombes.  He reviewed the results of your lab work which are normal/stable. Your blood sugar is elevated today at 233. We will give you some insulin to get this down, as we will give you a steroid in the clinic that will make your blood sugar go up.   We will proceed with your treatment today.   Return as scheduled.    Thank you for choosing [facility] at [facility] to provide your oncology and hematology care.  To afford each patient quality time with our provider, please arrive at least 15 minutes before your scheduled appointment time.   If you have a lab appointment with the [facility] please come in thru the Main Entrance and check in at the main information desk.  You need to re-schedule your appointment should you arrive 10 or more minutes late.  We strive to give you quality time with our providers, and arriving late affects you and other patients whose appointments are after yours.  Also, if you no show three or more times for appointments you may be dismissed from the clinic at the providers discretion.     Again, thank you for choosing [facility].  Our hope is that these requests will decrease the amount of time that you wait before being seen by our physicians.       _____________________________________________________________  Should you have questions after your visit to [facility], please contact our office at [phone] and follow the prompts.  Our office hours are 8:00 a.m. and 4:30 p.m. Monday - Friday.  Please note that voicemails left after 4:00 p.m. may not be returned until the following business day.  We are closed weekends and major holidays.  You do have access to a nurse 24-7, just call the main number to the clinic [phone] and do not press any options, hold on the line and a nurse will  answer the phone.    For prescription refill requests, have your pharmacy contact our office and allow 72 hours.    Due to Covid, you will need to wear a mask upon entering the hospital. If you do not have a mask, a mask will be given to you at the Main Entrance upon arrival. For doctor visits, patients may have 1 support person age 75 or older with them. For treatment visits, patients can not have anyone with them due to social distancing guidelines and our immunocompromised population.

## 2023-03-04 NOTE — Progress Notes (Signed)
Patient presents today for Rituximab and Bendeka infusion. Patient is in satisfactory condition with no new complaints voiced.  Vital signs are stable.  Labs reviewed by Dr. Delton Coombes during the office visit and all labs are within treatment parameters. Pt's blood sugar is 233, pt will receive Novolg 10 units Burchinal per Dr.K We will proceed with treatment per MD orders.   Treatment given today per MD orders. Tolerated infusion without adverse affects. Vital signs stable. No complaints at this time. Discharged from clinic via wheelchair in stable condition. Alert and oriented x 3. F/U with [facility] as scheduled.

## 2023-03-05 ENCOUNTER — Inpatient Hospital Stay: Payer: Medicare Other

## 2023-03-05 ENCOUNTER — Other Ambulatory Visit: Payer: Self-pay

## 2023-03-05 VITALS — BP 116/74 | HR 75 | Temp 97.6°F | Resp 18

## 2023-03-05 DIAGNOSIS — C8238 Follicular lymphoma grade IIIa, lymph nodes of multiple sites: Secondary | ICD-10-CM

## 2023-03-05 DIAGNOSIS — Z5111 Encounter for antineoplastic chemotherapy: Secondary | ICD-10-CM | POA: Diagnosis not present

## 2023-03-05 MED ORDER — SODIUM CHLORIDE 0.9 % IV SOLN
60.0000 mg/m2 | Freq: Once | INTRAVENOUS | Status: AC
  Administered 2023-03-05: 140 mg via INTRAVENOUS
  Filled 2023-03-05: qty 5.6

## 2023-03-05 MED ORDER — SODIUM CHLORIDE 0.9 % IV SOLN: Freq: Once | INTRAVENOUS | Status: AC

## 2023-03-05 MED ORDER — SODIUM CHLORIDE 0.9% FLUSH
10.0000 mL | INTRAVENOUS | Status: DC | PRN
  Administered 2023-03-05: 10 mL

## 2023-03-05 MED ORDER — SODIUM CHLORIDE 0.9 % IV SOLN
10.0000 mg | Freq: Once | INTRAVENOUS | Status: AC
  Administered 2023-03-05: 10 mg via INTRAVENOUS
  Filled 2023-03-05: qty 10

## 2023-03-05 MED ORDER — HEPARIN SOD (PORK) LOCK FLUSH 100 UNIT/ML IV SOLN
500.0000 [IU] | Freq: Once | INTRAVENOUS | Status: AC | PRN
  Administered 2023-03-05: 500 [IU]

## 2023-03-05 NOTE — Patient Instructions (Signed)
[facility]  Discharge Instructions: Thank you for choosing [facility] to provide your oncology and hematology care.  If you have a lab appointment with the [facility] - please note that after April 8th, 2024, all labs will be drawn in the cancer center.  You do not have to check in or register with the main entrance as you have in the past but will complete your check-in in the cancer center.  Wear comfortable clothing and clothing appropriate for easy access to any Portacath or PICC line.   We strive to give you quality time with your provider. You may need to reschedule your appointment if you arrive late (15 or more minutes).  Arriving late affects you and other patients whose appointments are after yours.  Also, if you miss three or more appointments without notifying the office, you may be dismissed from the clinic at the provider's discretion.      For prescription refill requests, have your pharmacy contact our office and allow 72 hours for refills to be completed.    Today you received the following chemotherapy and/or immunotherapy agents Bendeka.  Bendamustine Injection What is this medication? BENDAMUSTINE (BEN da MUS teen) treats leukemia and lymphoma. It works by slowing down the growth of cancer cells. This medicine may be used for other purposes; ask your health care provider or pharmacist if you have questions. COMMON BRAND NAME(S): Oren Beckmann, VIVIMUSTA What should I tell my care team before I take this medication? They need to know if you have any of these conditions: Infection, especially a viral infection, such as chickenpox, cold sores, herpes Kidney disease Liver disease An unusual or allergic reaction to bendamustine, mannitol, other medications, foods, dyes, or preservatives Pregnant or trying to get pregnant Breast-feeding How should I use this medication? This medication is injected into a vein. It is given  by your care team in a hospital or clinic setting. Talk to your care team about the use of this medication in children. Special care may be needed. Overdosage: If you think you have taken too much of this medicine contact a poison control center or emergency room at once. NOTE: This medicine is only for you. Do not share this medicine with others. What if I miss a dose? Keep appointments for follow-up doses. It is important not to miss your dose. Call your care team if you are unable to keep an appointment. What may interact with this medication? Do not take this medication with any of the following: Clozapine This medication may also interact with the following: Atazanavir Cimetidine Ciprofloxacin Enoxacin Fluvoxamine Medications for seizures, such as carbamazepine, phenobarbital Mexiletine Rifampin Tacrine Thiabendazole Zileuton This list may not describe all possible interactions. Give your health care provider a list of all the medicines, herbs, non-prescription drugs, or dietary supplements you use. Also tell them if you smoke, drink alcohol, or use illegal drugs. Some items may interact with your medicine. What should I watch for while using this medication? Visit your care team for regular checks on your progress. This medication may make you feel generally unwell. This is not uncommon, as chemotherapy can affect healthy cells as well as cancer cells. Report any side effects. Continue your course of treatment even though you feel ill unless your care team tells you to stop. You may need blood work while taking this medication. This medication may increase your risk of getting an infection. Call your care team for advice if you get  a fever, chills, sore throat, or other symptoms of a cold or flu. Do not treat yourself. Try to avoid being around people who are sick. This medication may cause serious skin reactions. They can happen weeks to months after starting the medication. Contact  your care team right away if you notice fevers or flu-like symptoms with a rash. The rash may be red or purple and then turn into blisters or peeling of the skin. You may also notice a red rash with swelling of the face, lips, or lymph nodes in your neck or under your arms. In some patients, this medication may cause a serious brain infection that may cause death. If you have any problems seeing, thinking, speaking, walking, or standing, tell your care team right away. If you cannot reach your care team, urgently seek other source of medical care. This medication may increase your risk to bruise or bleed. Call your care team if you notice any unusual bleeding. Talk to your care team about your risk of cancer. You may be more at risk for certain types of cancer if you take this medication. Talk to your care team about your risk of skin cancer. You may be more at risk for skin cancer if you take this medication. Talk to your care team if you or your partner wish to become pregnant or think either of you might be pregnant. This medication can cause serious birth defects if taken during pregnancy or for up to 6 months after the last dose. A negative pregnancy test is required before starting this medication. A reliable form of contraception is recommended while taking this medication and for 6 months after the last dose. Talk to your care team about reliable forms of contraception. Wear a condom while taking this medication and for at least 3 months after the last dose. Do not breast-feed while taking this medication or for at least 1 week after the last dose. This medication may cause infertility. Talk to your care team if you are concerned about your fertility. What side effects may I notice from receiving this medication? Side effects that you should report to your care team as soon as possible: Allergic reactions--skin rash, itching, hives, swelling of the face, lips, tongue, or throat Infection--fever,  chills, cough, sore throat, wounds that don't heal, pain or trouble when passing urine, general feeling of discomfort or being unwell Infusion reactions--chest pain, shortness of breath or trouble breathing, feeling faint or lightheaded Liver injury--right upper belly pain, loss of appetite, nausea, light-colored stool, dark yellow or brown urine, yellowing skin or eyes, unusual weakness or fatigue Low red blood cell level--unusual weakness or fatigue, dizziness, headache, trouble breathing Painful swelling, warmth, or redness of the skin, blisters or sores at the infusion site Rash, fever, and swollen lymph nodes Redness, blistering, peeling, or loosening of the skin, including inside the mouth Tumor lysis syndrome (TLS)--nausea, vomiting, diarrhea, decrease in the amount of urine, dark urine, unusual weakness or fatigue, confusion, muscle pain or cramps, fast or irregular heartbeat, joint pain Unusual bruising or bleeding Side effects that usually do not require medical attention (report to your care team if they continue or are bothersome): Diarrhea Fatigue Headache Loss of appetite Nausea Vomiting This list may not describe all possible side effects. Call your doctor for medical advice about side effects. You may report side effects to FDA at 1-800-FDA-1088. Where should I keep my medication? This medication is given in a hospital or clinic. It will not be stored  at home. NOTE: This sheet is a summary. It may not cover all possible information. If you have questions about this medicine, talk to your doctor, pharmacist, or health care provider.  2023 Elsevier/Gold Standard (2022-02-24 00:00:00)       To help prevent nausea and vomiting after your treatment, we encourage you to take your nausea medication as directed.  BELOW ARE SYMPTOMS THAT SHOULD BE REPORTED IMMEDIATELY: *FEVER GREATER THAN 100.4 F (38 C) OR HIGHER *CHILLS OR SWEATING *NAUSEA AND VOMITING THAT IS NOT CONTROLLED  WITH YOUR NAUSEA MEDICATION *UNUSUAL SHORTNESS OF BREATH *UNUSUAL BRUISING OR BLEEDING *URINARY PROBLEMS (pain or burning when urinating, or frequent urination) *BOWEL PROBLEMS (unusual diarrhea, constipation, pain near the anus) TENDERNESS IN MOUTH AND THROAT WITH OR WITHOUT PRESENCE OF ULCERS (sore throat, sores in mouth, or a toothache) UNUSUAL RASH, SWELLING OR PAIN  UNUSUAL VAGINAL DISCHARGE OR ITCHING   Items with * indicate a potential emergency and should be followed up as soon as possible or go to the Emergency Department if any problems should occur.  Please show the CHEMOTHERAPY ALERT CARD or IMMUNOTHERAPY ALERT CARD at check-in to the Emergency Department and triage nurse.  Should you have questions after your visit or need to cancel or reschedule your appointment, please contact [facility] [phone]  and follow the prompts.  Office hours are 8:00 a.m. to 4:30 p.m. Monday - Friday. Please note that voicemails left after 4:00 p.m. may not be returned until the following business day.  We are closed weekends and major holidays. You have access to a nurse at all times for urgent questions. Please call the main number to the clinic [phone] and follow the prompts.  For any non-urgent questions, you may also contact your provider using MyChart. We now offer e-Visits for anyone 44 and older to request care online for non-urgent symptoms. For details visit [email or web address].   Also download the MyChart app! Go to the app store, search "MyChart", open the app, select [facility], and log in with your MyChart username and password.

## 2023-03-05 NOTE — Progress Notes (Signed)
Patient tolerated treatment well with no complaints voiced.  Patient left via wheelchair with family in stable condition.  Vital signs stable at discharge.  Follow up as scheduled.

## 2023-03-05 NOTE — Progress Notes (Signed)
Patient presents today for chemotherapy infusion, Bendeka.  Patient is in satisfactory condition with no new complaints voiced.  Vital signs are stable.  Labs reviewed and all labs are within treatment parameters.  We will proceed with treatment per MD orders.

## 2023-03-06 ENCOUNTER — Inpatient Hospital Stay: Payer: Medicare Other

## 2023-03-06 VITALS — BP 125/66 | HR 83 | Temp 97.9°F | Resp 18

## 2023-03-06 DIAGNOSIS — Z5111 Encounter for antineoplastic chemotherapy: Secondary | ICD-10-CM | POA: Diagnosis not present

## 2023-03-06 DIAGNOSIS — C8238 Follicular lymphoma grade IIIa, lymph nodes of multiple sites: Secondary | ICD-10-CM

## 2023-03-06 MED ORDER — PEGFILGRASTIM-CBQV 6 MG/0.6ML ~~LOC~~ SOSY
6.0000 mg | PREFILLED_SYRINGE | Freq: Once | SUBCUTANEOUS | Status: AC
  Administered 2023-03-06: 6 mg via SUBCUTANEOUS
  Filled 2023-03-06: qty 0.6

## 2023-03-06 NOTE — Patient Instructions (Signed)
[facility]  Discharge Instructions: Thank you for choosing [facility] to provide your oncology and hematology care.  If you have a lab appointment with the Cancer Center - please note that after April 8th, 2024, all labs will be drawn in the cancer center.  You do not have to check in or register with the main entrance as you have in the past but will complete your check-in in the cancer center.  Wear comfortable clothing and clothing appropriate for easy access to any Portacath or PICC line.   We strive to give you quality time with your provider. You may need to reschedule your appointment if you arrive late (15 or more minutes).  Arriving late affects you and other patients whose appointments are after yours.  Also, if you miss three or more appointments without notifying the office, you may be dismissed from the clinic at the provider's discretion.      For prescription refill requests, have your pharmacy contact our office and allow 72 hours for refills to be completed.    Today you received the following chemotherapy and/or immunotherapy agents Udenyca.  Pegfilgrastim Injection What is this medication? PEGFILGRASTIM (PEG fil gra stim) lowers the risk of infection in people who are receiving chemotherapy. It works by helping your body make more white blood cells, which protects your body from infection. It may also be used to help people who have been exposed to high doses of radiation. This medicine may be used for other purposes; ask your health care provider or pharmacist if you have questions. COMMON BRAND NAME(S): Fulphila, Fylnetra, Neulasta, Nyvepria, Stimufend, UDENYCA, Ziextenzo What should I tell my care team before I take this medication? They need to know if you have any of these conditions: Kidney disease Latex allergy Ongoing radiation therapy Sickle cell disease Skin reactions to acrylic adhesives (On-Body Injector only) An unusual or  allergic reaction to pegfilgrastim, filgrastim, other medications, foods, dyes, or preservatives Pregnant or trying to get pregnant Breast-feeding How should I use this medication? This medication is for injection under the skin. If you get this medication at home, you will be taught how to prepare and give the pre-filled syringe or how to use the On-body Injector. Refer to the patient Instructions for Use for detailed instructions. Use exactly as directed. Tell your care team immediately if you suspect that the On-body Injector may not have performed as intended or if you suspect the use of the On-body Injector resulted in a missed or partial dose. It is important that you put your used needles and syringes in a special sharps container. Do not put them in a trash can. If you do not have a sharps container, call your pharmacist or care team to get one. Talk to your care team about the use of this medication in children. While this medication may be prescribed for selected conditions, precautions do apply. Overdosage: If you think you have taken too much of this medicine contact a poison control center or emergency room at once. NOTE: This medicine is only for you. Do not share this medicine with others. What if I miss a dose? It is important not to miss your dose. Call your care team if you miss your dose. If you miss a dose due to an On-body Injector failure or leakage, a new dose should be administered as soon as possible using a single prefilled syringe for manual use. What may interact with this medication? Interactions have not   been studied. This list may not describe all possible interactions. Give your health care provider a list of all the medicines, herbs, non-prescription drugs, or dietary supplements you use. Also tell them if you smoke, drink alcohol, or use illegal drugs. Some items may interact with your medicine. What should I watch for while using this medication? Your condition will  be monitored carefully while you are receiving this medication. You may need blood work done while you are taking this medication. Talk to your care team about your risk of cancer. You may be more at risk for certain types of cancer if you take this medication. If you are going to need a MRI, CT scan, or other procedure, tell your care team that you are using this medication (On-Body Injector only). What side effects may I notice from receiving this medication? Side effects that you should report to your care team as soon as possible: Allergic reactions--skin rash, itching, hives, swelling of the face, lips, tongue, or throat Capillary leak syndrome--stomach or muscle pain, unusual weakness or fatigue, feeling faint or lightheaded, decrease in the amount of urine, swelling of the ankles, hands, or feet, trouble breathing High white blood cell level--fever, fatigue, trouble breathing, night sweats, change in vision, weight loss Inflammation of the aorta--fever, fatigue, back, chest, or stomach pain, severe headache Kidney injury (glomerulonephritis)--decrease in the amount of urine, red or dark brown urine, foamy or bubbly urine, swelling of the ankles, hands, or feet Shortness of breath or trouble breathing Spleen injury--pain in upper left stomach or shoulder Unusual bruising or bleeding Side effects that usually do not require medical attention (report to your care team if they continue or are bothersome): Bone pain Pain in the hands or feet This list may not describe all possible side effects. Call your doctor for medical advice about side effects. You may report side effects to FDA at 1-800-FDA-1088. Where should I keep my medication? Keep out of the reach of children. If you are using this medication at home, you will be instructed on how to store it. Throw away any unused medication after the expiration date on the label. NOTE: This sheet is a summary. It may not cover all possible  information. If you have questions about this medicine, talk to your doctor, pharmacist, or health care provider.  2023 Elsevier/Gold Standard (2021-08-02 00:00:00)       To help prevent nausea and vomiting after your treatment, we encourage you to take your nausea medication as directed.  BELOW ARE SYMPTOMS THAT SHOULD BE REPORTED IMMEDIATELY: *FEVER GREATER THAN 100.4 F (38 C) OR HIGHER *CHILLS OR SWEATING *NAUSEA AND VOMITING THAT IS NOT CONTROLLED WITH YOUR NAUSEA MEDICATION *UNUSUAL SHORTNESS OF BREATH *UNUSUAL BRUISING OR BLEEDING *URINARY PROBLEMS (pain or burning when urinating, or frequent urination) *BOWEL PROBLEMS (unusual diarrhea, constipation, pain near the anus) TENDERNESS IN MOUTH AND THROAT WITH OR WITHOUT PRESENCE OF ULCERS (sore throat, sores in mouth, or a toothache) UNUSUAL RASH, SWELLING OR PAIN  UNUSUAL VAGINAL DISCHARGE OR ITCHING   Items with * indicate a potential emergency and should be followed up as soon as possible or go to the Emergency Department if any problems should occur.  Please show the CHEMOTHERAPY ALERT CARD or IMMUNOTHERAPY ALERT CARD at check-in to the Emergency Department and triage nurse.  Should you have questions after your visit or need to cancel or reschedule your appointment, please contact [facility] [phone]  and follow the prompts.  Office hours are 8:00 a.m.   to 4:30 p.m. Monday - Friday. Please note that voicemails left after 4:00 p.m. may not be returned until the following business day.  We are closed weekends and major holidays. You have access to a nurse at all times for urgent questions. Please call the main number to the clinic [phone] and follow the prompts.  For any non-urgent questions, you may also contact your provider using MyChart. We now offer e-Visits for anyone 18 and older to request care online for non-urgent symptoms. For details visit [email or web address].   Also download the MyChart  app! Go to the app store, search "MyChart", open the app, select [facility], and log in with your MyChart username and password.   

## 2023-03-06 NOTE — Progress Notes (Signed)
Alejandro [name] presents today for injection per the provider's orders.  Udenyca administration without incident; injection site WNL; see MAR for injection details.  Patient tolerated procedure well and without incident.  No questions or complaints noted at this time. Discharged from clinic by wheel chair in stable condition. Alert and oriented x 3. F/U with [facility] as scheduled.

## 2023-03-26 ENCOUNTER — Encounter (HOSPITAL_COMMUNITY)
Admission: RE | Admit: 2023-03-26 | Discharge: 2023-03-26 | Disposition: A | Discharge status: Home / Self Care | Payer: Medicare Other | Source: Ambulatory Visit | Attending: Hematology | Admitting: Hematology

## 2023-03-26 DIAGNOSIS — C8238 Follicular lymphoma grade IIIa, lymph nodes of multiple sites: Secondary | ICD-10-CM | POA: Insufficient documentation

## 2023-03-31 NOTE — Progress Notes (Signed)
[facility] [address], [place] [address]    Clinic Day:  04/01/2023  Referring physician: Doreatha Massed, Alejandro [name]  Patient Care Team: Alejandro Rutherford, Alejandro [name] as PCP - General (Adult Health Nurse Practitioner) Alejandro Sidle, Alejandro [name] as PCP - Cardiology (Cardiology) Alejandro Massed, Alejandro [name] as Medical Oncologist (Medical Oncology) Alejandro Sarah, Alejandro [name] as Oncology Nurse Navigator (Medical Oncology)   ASSESSMENT & PLAN:   Assessment: 1.  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  No B symptoms. - Reports hoarseness of voice for the last 1 month with occasional choking to liquids. - CT chest (09/03/2022): Interval enlargement of matted appearing pretracheal and right paratracheal lymph node measuring 2.7 x 2.7 cm, previously 1.9 x 1.6 cm (08/23/2021), mild pulmonary fibrosis. - PET/CT scan (09/11/2022): Mildly metabolically lower cervical lymph nodes bilaterally, numerous on the right with SUV 3.7.  Nodule in the deep lobe of the right parotid gland, 1.4 x 1.0 SUV 10.2.  Asymmetric hypermetabolic activity in the right vocal cord, SUV 8.7 with no focal mass.  Right paratracheal mass 3.7 x 2.8 cm SUV 10.6.  10 mm left paratracheal node with SUV 5.5.  Left para-aortic adenopathy 4.5 x 2.4 cm, SUV 12.4.  Additional smaller retroperitoneal and mesenteric lymph nodes mildly hypermetabolic.  10 mm left inguinal node with SUV 4.3.  Hypermetabolic activity along the medial aspect of the left femoral neck SUV 6.8.  No clear bone lesion on the CT images. - Retroperitoneal lymph node biopsy (10/10/2022): Atypical lymphoid proliferation, but concerning for lymphoproliferative disorder. - ENT evaluation (Dr. Pollyann Kennedy) fiberoptic laryngoscopy: Left vocal cord paralysis - Bronchoscopy and biopsy of 4R lymph node (11/17/2022): Suspicious for B-cell lymphoma. - Left inguinal excisional biopsy on 12/15/2022 - Pathology: High-grade  follicular lymphoma, grade 3 - Cycle 1 of BR on 01/07/2023   2.  Social/family history: - He lives at home by himself and is independent of all ADLs and IADLs. 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  Non-smoker.  Has secondhand smoke exposure from his wife. - No family history of malignancies.    Plan: 1.  Stage III high-grade follicular lymphoma (grade 3A): - He had completed 3 cycles of Bendamustine at 60 mg/m. - He reports severe fatigue after last cycle.  He reports dyspnea on exertion. - He missed PET scan last Thursday as he had eaten a bowl of ice cream the night before and his sugar was about 300. - Reviewed labs today: Normal LFTs.  LDH normal.  CBC grossly normal with anemia from myelosuppression. - Because of his severe fatigue, I have recommended that we hold off on his treatment today.  We will arrange for PET scan on 04/09/2023.  If PET scan shows excellent response, will consider further dose reduction of Bendamustine for better tolerability.  I will reevaluate him during the week of 04/13/2023.   2.  TLS prophylaxis: - Continue allopurinol 300 mg daily.  Uric acid 3.7.   3.  Left vocal cord paralysis: - Dr. Pollyann Kennedy did laryngoscopy on 10/30/2022 and recommended CT soft tissue neck with contrast.  4.  Diabetes: - He is on glipizide 10 mg daily and metformin 750 mg twice daily. - He was told to not to eat ice cream the day before the scan.    No orders of the defined types were placed in this encounter.     I,Alejandro [name],acting as a Neurosurgeon  for Alejandro Massed, Alejandro [name].,have documented all relevant documentation on the behalf of Alejandro Massed, Alejandro [name],as directed by  Alejandro Massed, Alejandro [name] while in the presence of Alejandro Massed, Alejandro [name].   I, Alejandro Massed Alejandro [name], have reviewed the above documentation for accuracy and completeness, and I agree with the above.   Alejandro Massed, Alejandro [name]   5/1/20245:55 PM  CHIEF COMPLAINT:   Diagnosis:  3A follicular lymphoma, stage III    Cancer Staging  Follicular lymphoma grade 3a (HCC) Staging form: Hodgkin and Non-Hodgkin Lymphoma, AJCC 8th Edition - Clinical stage from 12/23/2022: Stage III (Follicular lymphoma) - Signed by Alejandro Massed, Alejandro [name] on 12/23/2022    Prior Therapy: none  Current Therapy:  Bendamustine and rituximab    HISTORY OF PRESENT ILLNESS:   Oncology History  Follicular lymphoma grade 3a (HCC)  12/23/2022 Initial Diagnosis   Follicular lymphoma grade 3a (HCC)   12/23/2022 Cancer Staging   Staging form: Hodgkin and Non-Hodgkin Lymphoma, AJCC 8th Edition - Clinical stage from 12/23/2022: Stage III (Follicular lymphoma) - Signed by Alejandro Massed, Alejandro [name] on 12/23/2022 Histopathologic type: Follicular lymphoma, grade 3 Stage prefix: Initial diagnosis   01/07/2023 -  Chemotherapy   Patient is on Treatment Plan : NON-HODGKINS LYMPHOMA Rituximab D1 + Bendamustine D1,2 q28d x 6 cycles        INTERVAL HISTORY:   Alejandro [name] is a 82 y.o. male presenting to clinic today for follow up of 3A follicular lymphoma, stage III. He was last seen by me on 03/04/23.  He is scheduled for restaging PET scan on 04/09/23.  Today, he states that he is doing well overall. His appetite level is at 100%. His energy level is at 10%.  PAST MEDICAL HISTORY:   Past Medical History: Past Medical History:  Diagnosis Date   A-fib (HCC)    AAA Ultrasound 01/11/2020    AAA [place] 01/11/20:  no abdominal aortic aneurysm, 2.4 cm   Aortic insufficiency    a. mild-mod by intraop TEE 05/2018 (no aortic stenosis) // Echo 2/21: mild //  Echo 9/22: trivial   Aortic stenosis    Echo 9/22: mild (mean 16) // Echo 2/21: mild (mean 18.7)   CAD (coronary artery disease)    a. NSTEMI 05/2018 with multivessel disease -> s/p CABGx4 05/11/18 // Myoview 06/07/20: inf scar w mild peri-infarct ischemia, EF 37; Int Risk   Cancer (HCC)    Carotid artery disease (HCC)    PreCABG [place] 05/07/18: Bilat ICA 1-39   CKD (chronic  kidney disease), stage III (HCC)    HFimpEF (heart failure with improved EF)    Echocardiogram 01/11/20: EF 45, Gr 3 DD, mild AI, mild AS (mean 18.7 mmHg) // Echocardiogram 08/14/21:  EF 55-60, inf HK, mild LVH, Gr 1 DD, normal RVSF, RVSP 25.3, mild LAE, trivial MR, MAC, trivial AI, mild AS (mean 16 mmHg, Vmax 283 cm/s, DI 0.41)   History of nephrolithiasis 2004   Hyperlipidemia    Hypertension    IBS (irritable colon syndrome)    Ischemic cardiomyopathy    Mitral regurgitation    Echo 9/22: trivial MR   Postoperative atrial fibrillation (HCC)    PVC's (premature ventricular contractions)    Reflux    Type 2 diabetes mellitus (HCC)     Surgical History: Past Surgical History:  Procedure Laterality Date   BRONCHIAL NEEDLE ASPIRATION BIOPSY  11/17/2022   Procedure: BRONCHIAL NEEDLE ASPIRATION BIOPSIES;  Surgeon: Leslye Peer, Alejandro [name];  Location: [facility] ENDOSCOPY;  Service: Cardiopulmonary;;   CARDIOVERSION  CORONARY ARTERY BYPASS GRAFT N/A 05/11/2018   Procedure: CORONARY ARTERY BYPASS GRAFTING (CABG), on pump, times four, using left internal mammary artery and endoscopically harvested right greater saphenous leg vein.;  Surgeon: Kerin Perna, Alejandro [name];  [facility] OR;  Service: Open Heart Surgery;  Laterality: N/A;   ESOPHAGOGASTRODUODENOSCOPY  02/04/2012   Procedure: ESOPHAGOGASTRODUODENOSCOPY (EGD);  Surgeon: Malissa Hippo, Alejandro [name];  Location: [facility] ENDO SUITE;  Service: Endoscopy;  Laterality: N/A;  100   INGUINAL LYMPH NODE BIOPSY Left 12/15/2022   Procedure: INGUINAL LYMPH NODE BIOPSY;  Surgeon: Lewie Chamber, DO;  Location: [facility] ORS;  Service: General;  Laterality: Left;   LEFT HEART CATH AND CORONARY ANGIOGRAPHY N/A 05/04/2018   Procedure: LEFT HEART CATH AND CORONARY ANGIOGRAPHY;  Surgeon: Lyn Records, Alejandro [name];  Location: [facility] INVASIVE CV LAB;  Service: Cardiovascular;  Laterality: N/A;   NASAL FRACTURE SURGERY     PORTACATH PLACEMENT Right 01/06/2023   Procedure: INSERTION PORT-A-CATH;   Surgeon: Lewie Chamber, DO;  Location: [facility] ORS;  Service: General;  Laterality: Right;   TEE WITHOUT CARDIOVERSION N/A 05/11/2018   Procedure: TRANSESOPHAGEAL ECHOCARDIOGRAM (TEE);  Surgeon: Donata Clay, Theron Arista, Alejandro [name];  Location[facility] OR;  Service: Open Heart Surgery;  Laterality: N/A;   VIDEO BRONCHOSCOPY WITH ENDOBRONCHIAL ULTRASOUND Right 11/17/2022   Procedure: VIDEO BRONCHOSCOPY WITH ENDOBRONCHIAL ULTRASOUND;  Surgeon: Leslye Peer, Alejandro [name];  Location: [facility] ENDOSCOPY;  Service: Cardiopulmonary;  Laterality: Right;    Social History: Social History   Socioeconomic History   Marital status: Widowed    Spouse name: Not on file   Number of children: Not on file   Years of education: Not on file   Highest education level: Not on file  Occupational History   Not on file  Tobacco Use   Smoking status: Never   Smokeless tobacco: Never  Vaping Use   Vaping Use: Never used  Substance and Sexual Activity   Alcohol use: No    Alcohol/week: 0.0 standard drinks of alcohol   Drug use: No   Sexual activity: Yes  Other Topics Concern   Not on file  Social History Narrative   Not on file   Social Determinants of Health   Financial Resource Strain: Not on file  Food Insecurity: Not on file  Transportation Needs: Not on file  Physical Activity: Not on file  Stress: Not on file  Social Connections: Not on file  Intimate Partner Violence: Not on file    Family History: Family History  Problem Relation Age of Onset   Diabetes Mellitus II Mother    Heart attack Father    Heart attack Brother    Heart disease Other    Arthritis Other    Asthma Other    Diabetes Other     Current Medications:  Current Outpatient Medications:    acetaminophen (TYLENOL) 500 MG tablet, Take 1,000 mg by mouth every 6 (six) hours as needed for moderate pain or mild pain., Disp: , Rfl:    albuterol (VENTOLIN HFA) 108 (90 Base) MCG/ACT inhaler, Inhale 2 puffs into the lungs every 6 (six) hours as needed  for wheezing or shortness of breath., Disp: , Rfl:    allopurinol (ZYLOPRIM) 300 MG tablet, Take 1 tablet (300 mg total) by mouth daily., Disp: 30 tablet, Rfl: 3   apixaban (ELIQUIS) 5 MG TABS tablet, Take 1 tablet (5 mg total) by mouth 2 (two) times daily., Disp: 60 tablet, Rfl: 5   atorvastatin (LIPITOR) 40 MG tablet, Take 40 mg  by mouth at bedtime., Disp: , Rfl:    cyanocobalamin (VITAMIN B12) 1000 MCG tablet, Take 1,000 mcg by mouth daily., Disp: , Rfl:    cyanocobalamin (VITAMIN B12) 1000 MCG/ML injection, Inject 1,000 mcg into the skin every 30 (thirty) days., Disp: , Rfl:    docusate sodium (COLACE) 100 MG capsule, Take 1 capsule (100 mg total) by mouth 2 (two) times daily. (Patient taking differently: Take 100 mg by mouth 2 (two) times daily as needed for mild constipation or moderate constipation.), Disp: 60 capsule, Rfl: 2   famotidine (PEPCID) 20 MG tablet, Take 1 tablet (20 mg total) by mouth 2 (two) times daily., Disp: , Rfl:    ferrous sulfate 325 (65 FE) MG EC tablet, Take 1 tablet (325 mg total) by mouth daily with breakfast., Disp: 100 tablet, Rfl: 1   fluticasone (FLONASE) 50 MCG/ACT nasal spray, Place 1 spray into both nostrils daily., Disp: 11.1 mL, Rfl: 2   furosemide (LASIX) 80 MG tablet, Take 1 tablet (80 mg total) by mouth daily., Disp: 30 tablet, Rfl: 2   glipiZIDE (GLUCOTROL XL) 10 MG 24 hr tablet, Take 10 mg by mouth daily., Disp: , Rfl:    loratadine (CLARITIN) 10 MG tablet, Take 10 mg by mouth daily., Disp: , Rfl:    Melatonin 10 MG CAPS, Take 30 mg by mouth at bedtime as needed (sleep)., Disp: , Rfl:    metFORMIN (GLUCOPHAGE-XR) 750 MG 24 hr tablet, Take 1 tablet by mouth 2 (two) times daily with a meal., Disp: , Rfl:    oxyCODONE (ROXICODONE) 5 MG immediate release tablet, Take 1 tablet (5 mg total) by mouth every 6 (six) hours as needed. (Patient taking differently: Take 5 mg by mouth every 6 (six) hours as needed for severe pain or moderate pain.), Disp: 12 tablet,  Rfl: 0   pantoprazole (PROTONIX) 40 MG tablet, Take 40 mg by mouth daily., Disp: , Rfl:    polyethylene glycol (MIRALAX / GLYCOLAX) 17 g packet, Take 17 g by mouth daily. (Patient taking differently: Take 17 g by mouth daily as needed for moderate constipation.), Disp: 30 each, Rfl: 0   potassium chloride (MICRO-K) 10 MEQ CR capsule, Take 10 mEq by mouth daily., Disp: , Rfl:    potassium chloride SA (KLOR-CON M) 20 MEQ tablet, Take 1 tablet (20 mEq total) by mouth daily., Disp: 30 tablet, Rfl: 2   Povidone, PF, (IVIZIA DRY EYES) 0.5 % SOLN, Place 1 drop into both eyes daily as needed (dry eyes)., Disp: , Rfl:    prochlorperazine (COMPAZINE) 10 MG tablet, Take 1 tablet (10 mg total) by mouth every 6 (six) hours as needed for nausea or vomiting., Disp: 60 tablet, Rfl: 1   sacubitril-valsartan (ENTRESTO) 49-51 MG, Take 1 tablet by mouth 2 (two) times daily., Disp: 60 tablet, Rfl: 2   sodium chloride (OCEAN) 0.65 % SOLN nasal spray, Place 1 spray into both nostrils as needed for congestion., Disp: 44 mL, Rfl: 2   spironolactone (ALDACTONE) 25 MG tablet, Take 0.5 tablets (12.5 mg total) by mouth daily., Disp: 45 tablet, Rfl: 3 No current facility-administered medications for this visit.  Facility-Administered Medications Ordered in Other Visits:    sodium chloride flush (NS) 0.9 % injection 10 mL, 10 mL, Intracatheter, PRN, Alejandro Massed, Alejandro [name], 10 mL at 01/07/23 1446   Allergies: No Known Allergies  REVIEW OF SYSTEMS:   Review of Systems  Constitutional:  Positive for fatigue. Negative for chills and fever.  HENT:   Negative for  lump/mass, mouth sores, nosebleeds, sore throat and trouble swallowing.   Eyes:  Negative for eye problems.  Respiratory:  Positive for shortness of breath. Negative for cough.   Cardiovascular:  Negative for chest pain, leg swelling and palpitations.  Gastrointestinal:  Negative for abdominal pain, constipation, diarrhea, nausea and vomiting.  Genitourinary:   Negative for bladder incontinence, difficulty urinating, dysuria, frequency, hematuria and nocturia.   Musculoskeletal:  Negative for arthralgias, back pain, flank pain, myalgias and neck pain.  Skin:  Negative for itching and rash.  Neurological:  Negative for dizziness, headaches and numbness.  Hematological:  Does not bruise/bleed easily.  Psychiatric/Behavioral:  Positive for sleep disturbance. Negative for depression and suicidal ideas. The patient is not nervous/anxious.   All other systems reviewed and are negative.    VITALS:   There were no vitals taken for this visit.  Wt Readings from Last 3 Encounters:  04/01/23 230 lb 3.2 oz (104.4 kg)  03/04/23 225 lb 9.6 oz (102.3 kg)  02/24/23 227 lb 3.2 oz (103.1 kg)    There is no height or weight on file to calculate BMI.  Performance status (ECOG): 1 - Symptomatic but completely ambulatory  PHYSICAL EXAM:   Physical Exam Vitals and nursing note reviewed. Exam conducted with a chaperone present.  Constitutional:      Appearance: Normal appearance.  Cardiovascular:     Rate and Rhythm: Normal rate and regular rhythm.     Pulses: Normal pulses.     Heart sounds: Normal heart sounds.  Pulmonary:     Effort: Pulmonary effort is normal.     Breath sounds: Normal breath sounds.  Abdominal:     Palpations: Abdomen is soft. There is no hepatomegaly, splenomegaly or mass.     Tenderness: There is no abdominal tenderness.  Musculoskeletal:     Right lower leg: No edema.     Left lower leg: No edema.  Lymphadenopathy:     Cervical: No cervical adenopathy.     Right cervical: No superficial, deep or posterior cervical adenopathy.    Left cervical: No superficial, deep or posterior cervical adenopathy.     Upper Body:     Right upper body: No supraclavicular or axillary adenopathy.     Left upper body: No supraclavicular or axillary adenopathy.  Neurological:     General: No focal deficit present.     Mental Status: He is alert  and oriented to person, place, and time.  Psychiatric:        Mood and Affect: Mood normal.        Behavior: Behavior normal.     LABS:      Latest Ref Rng & Units 04/01/2023    7:45 AM 03/04/2023    7:39 AM 02/16/2023   12:36 AM  CBC  WBC 4.0 - 10.5 K/uL 7.4  7.9  19.6   Hemoglobin 13.0 - 17.0 g/dL 16.1  09.6  04.5   Hematocrit 39.0 - 52.0 % 36.5  37.7  37.0   Platelets 150 - 400 K/uL 285  291  183       Latest Ref Rng & Units 04/01/2023    7:45 AM 03/04/2023    7:39 AM 02/17/2023    4:06 AM  CMP  Glucose 70 - 99 mg/dL 409  811  914   BUN 8 - 23 mg/dL 14  16  16    Creatinine 0.61 - 1.24 mg/dL 7.82  9.56  2.13   Sodium 135 - 145 mmol/L 133  131  135   Potassium 3.5 - 5.1 mmol/L 3.7  3.9  3.8   Chloride 98 - 111 mmol/L 101  99  102   CO2 22 - 32 mmol/L 23  22  24    Calcium 8.9 - 10.3 mg/dL 8.6  8.6  8.2   Total Protein 6.5 - 8.1 g/dL 6.7  6.9    Total Bilirubin 0.3 - 1.2 mg/dL 0.5  0.6    Alkaline Phos 38 - 126 U/L 94  95    AST 15 - 41 U/L 20  20    ALT 0 - 44 U/L 17  19       No results found for: "CEA1", "CEA" / No results found for: "CEA1", "CEA" No results found for: "PSA1" No results found for: "ZOX096" No results found for: "CAN125"  No results found for: "TOTALPROTELP", "ALBUMINELP", "A1GS", "A2GS", "BETS", "BETA2SER", "GAMS", "MSPIKE", "SPEI" Lab Results  Component Value Date   TIBC 361 02/04/2023   FERRITIN 47 02/04/2023   IRONPCTSAT 13 (L) 02/04/2023   Lab Results  Component Value Date   LDH 129 04/01/2023   LDH 128 03/04/2023   LDH 118 02/04/2023     STUDIES:   No results found.

## 2023-04-01 ENCOUNTER — Inpatient Hospital Stay: Payer: Medicare Other | Attending: Hematology

## 2023-04-01 ENCOUNTER — Inpatient Hospital Stay: Payer: Medicare Other

## 2023-04-01 ENCOUNTER — Inpatient Hospital Stay: Payer: Medicare Other | Admitting: Hematology

## 2023-04-01 DIAGNOSIS — C8230 Follicular lymphoma grade IIIa, unspecified site: Secondary | ICD-10-CM | POA: Insufficient documentation

## 2023-04-01 DIAGNOSIS — C8238 Follicular lymphoma grade IIIa, lymph nodes of multiple sites: Secondary | ICD-10-CM

## 2023-04-01 DIAGNOSIS — E119 Type 2 diabetes mellitus without complications: Secondary | ICD-10-CM | POA: Insufficient documentation

## 2023-04-01 DIAGNOSIS — Z9221 Personal history of antineoplastic chemotherapy: Secondary | ICD-10-CM | POA: Diagnosis not present

## 2023-04-01 DIAGNOSIS — J3801 Paralysis of vocal cords and larynx, unilateral: Secondary | ICD-10-CM | POA: Insufficient documentation

## 2023-04-01 LAB — CBC WITH DIFFERENTIAL/PLATELET
Abs Immature Granulocytes: 0.04 10*3/uL (ref 0.00–0.07)
Basophils Absolute: 0 10*3/uL (ref 0.0–0.1)
Basophils Relative: 0 %
Eosinophils Absolute: 0.1 10*3/uL (ref 0.0–0.5)
Eosinophils Relative: 1 %
HCT: 36.5 % — ABNORMAL LOW (ref 39.0–52.0)
Hemoglobin: 12.3 g/dL — ABNORMAL LOW (ref 13.0–17.0)
Immature Granulocytes: 1 %
Lymphocytes Relative: 23 %
Lymphs Abs: 1.7 10*3/uL (ref 0.7–4.0)
MCH: 30.2 pg (ref 26.0–34.0)
MCHC: 33.7 g/dL (ref 30.0–36.0)
MCV: 89.7 fL (ref 80.0–100.0)
Monocytes Absolute: 1.1 10*3/uL — ABNORMAL HIGH (ref 0.1–1.0)
Monocytes Relative: 15 %
Neutro Abs: 4.5 10*3/uL (ref 1.7–7.7)
Neutrophils Relative %: 60 %
Platelets: 285 10*3/uL (ref 150–400)
RBC: 4.07 MIL/uL — ABNORMAL LOW (ref 4.22–5.81)
RDW: 15.4 % (ref 11.5–15.5)
WBC: 7.4 10*3/uL (ref 4.0–10.5)
nRBC: 0 % (ref 0.0–0.2)

## 2023-04-01 LAB — COMPREHENSIVE METABOLIC PANEL
ALT: 17 U/L (ref 0–44)
AST: 20 U/L (ref 15–41)
Albumin: 3.6 g/dL (ref 3.5–5.0)
Alkaline Phosphatase: 94 U/L (ref 38–126)
Anion gap: 9 (ref 5–15)
BUN: 14 mg/dL (ref 8–23)
CO2: 23 mmol/L (ref 22–32)
Calcium: 8.6 mg/dL — ABNORMAL LOW (ref 8.9–10.3)
Chloride: 101 mmol/L (ref 98–111)
Creatinine, Ser: 1.09 mg/dL (ref 0.61–1.24)
GFR, Estimated: >60 mL/min (ref >60)
Glucose, Bld: 266 mg/dL — ABNORMAL HIGH (ref 70–99)
Potassium: 3.7 mmol/L (ref 3.5–5.1)
Sodium: 133 mmol/L — ABNORMAL LOW (ref 135–145)
Total Bilirubin: 0.5 mg/dL (ref 0.3–1.2)
Total Protein: 6.7 g/dL (ref 6.5–8.1)

## 2023-04-01 LAB — URIC ACID: Uric Acid, Serum: 3.7 mg/dL (ref 3.7–8.6)

## 2023-04-01 LAB — MAGNESIUM: Magnesium: 1.7 mg/dL (ref 1.7–2.4)

## 2023-04-01 LAB — LACTATE DEHYDROGENASE: LDH: 129 U/L (ref 98–192)

## 2023-04-01 MED ORDER — SODIUM CHLORIDE 0.9% FLUSH
10.0000 mL | INTRAVENOUS | Status: DC | PRN
  Administered 2023-04-01: 10 mL via INTRAVENOUS

## 2023-04-01 MED ORDER — HEPARIN SOD (PORK) LOCK FLUSH 100 UNIT/ML IV SOLN
500.0000 [IU] | Freq: Once | INTRAVENOUS | Status: AC
  Administered 2023-04-01: 500 [IU] via INTRAVENOUS

## 2023-04-01 NOTE — Progress Notes (Signed)
Patient seen and examined today by Dr. Ellin Saba. Treatment held due to fatigue. Primary RN and Pharmacy made aware.

## 2023-04-01 NOTE — Patient Instructions (Signed)
[facility]  Discharge Instructions  You were seen and examined today by Dr. Ellin Saba.  Dr. Ellin Saba will hold your treatment until after the PET scan. Your blood sugar has to be less than 260 for them to be able to do the PET scan.  Follow-up as scheduled.  Thank you for choosing [facility] to provide your oncology and hematology care.   To afford each patient quality time with our provider, please arrive at least 15 minutes before your scheduled appointment time. You may need to reschedule your appointment if you arrive late (10 or more minutes). Arriving late affects you and other patients whose appointments are after yours.  Also, if you miss three or more appointments without notifying the office, you may be dismissed from the clinic at the provider's discretion.    Again, thank you for choosing [facility].  Our hope is that these requests will decrease the amount of time that you wait before being seen by our physicians.   If you have a lab appointment with the Cancer Center - please note that after April 8th, all labs will be drawn in the cancer center.  You do not have to check in or register with the main entrance as you have in the past but will complete your check-in at the cancer center.            _____________________________________________________________  Should you have questions after your visit to [facility], please contact our office at [phone] and follow the prompts.  Our office hours are 8:00 a.m. to 4:30 p.m. Monday - Thursday and 8:00 a.m. to 2:30 p.m. Friday.  Please note that voicemails left after 4:00 p.m. may not be returned until the following business day.  We are closed weekends and all major holidays.  You do have access to a nurse 24-7, just call the main number to the clinic [phone] and do not press any options, hold on the line and a nurse will answer the phone.     For prescription refill requests, have your pharmacy contact our office and allow 72 hours.    Masks are no longer required in the cancer centers. If you would like for your care team to wear a mask while they are taking care of you, please let them know. You may have one support person who is at least 80 years old accompany you for your appointments.

## 2023-04-01 NOTE — Progress Notes (Signed)
No treatment today per MD.  

## 2023-04-02 ENCOUNTER — Inpatient Hospital Stay: Payer: Medicare Other

## 2023-04-03 ENCOUNTER — Inpatient Hospital Stay: Payer: Medicare Other

## 2023-04-09 ENCOUNTER — Encounter (HOSPITAL_COMMUNITY)
Admission: RE | Admit: 2023-04-09 | Discharge: 2023-04-09 | Disposition: A | Discharge status: Home / Self Care | Payer: Medicare Other | Source: Ambulatory Visit | Attending: Hematology | Admitting: Hematology

## 2023-04-09 DIAGNOSIS — C8238 Follicular lymphoma grade IIIa, lymph nodes of multiple sites: Secondary | ICD-10-CM | POA: Diagnosis present

## 2023-04-09 MED ORDER — FLUDEOXYGLUCOSE F - 18 (FDG) INJECTION
11.2700 | Freq: Once | INTRAVENOUS | Status: AC | PRN
  Administered 2023-04-09: 11.27 via INTRAVENOUS

## 2023-04-13 NOTE — Progress Notes (Signed)
[facility] [address], [place] [address]    Clinic Day:  04/14/2023  Referring physician: Roe Rutherford, NP  Patient Care Team: Roe Rutherford, NP as PCP - General (Adult Health Nurse Practitioner) Jonelle Sidle, MD as PCP - Cardiology (Cardiology) Doreatha Massed, MD as Medical Oncologist (Medical Oncology) Therese Sarah, RN as Oncology Nurse Navigator (Medical Oncology)   ASSESSMENT & PLAN:   Assessment: 1.  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  No B symptoms. - Reports hoarseness of voice for the last 1 month with occasional choking to liquids. - CT chest (09/03/2022): Interval enlargement of matted appearing pretracheal and right paratracheal lymph node measuring 2.7 x 2.7 cm, previously 1.9 x 1.6 cm (08/23/2021), mild pulmonary fibrosis. - PET/CT scan (09/11/2022): Mildly metabolically lower cervical lymph nodes bilaterally, numerous on the right with SUV 3.7.  Nodule in the deep lobe of the right parotid gland, 1.4 x 1.0 SUV 10.2.  Asymmetric hypermetabolic activity in the right vocal cord, SUV 8.7 with no focal mass.  Right paratracheal mass 3.7 x 2.8 cm SUV 10.6.  10 mm left paratracheal node with SUV 5.5.  Left para-aortic adenopathy 4.5 x 2.4 cm, SUV 12.4.  Additional smaller retroperitoneal and mesenteric lymph nodes mildly hypermetabolic.  10 mm left inguinal node with SUV 4.3.  Hypermetabolic activity along the medial aspect of the left femoral neck SUV 6.8.  No clear bone lesion on the CT images. - Retroperitoneal lymph node biopsy (10/10/2022): Atypical lymphoid proliferation, but concerning for lymphoproliferative disorder. - ENT evaluation (Dr. Pollyann Kennedy) fiberoptic laryngoscopy: Left vocal cord paralysis - Bronchoscopy and biopsy of 4R lymph node (11/17/2022): Suspicious for B-cell lymphoma. - Left inguinal excisional biopsy on 12/15/2022 - Pathology: High-grade  follicular lymphoma, grade 3 - 3 cycles of BR from 01/07/2023 through 03/04/2023 - PET scan on 04/09/2023: Deauville 2 response. - Chemotherapy was held due to severe weakness.   2.  Social/family history: - He lives at home by himself and is independent of all ADLs and IADLs. 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  Non-smoker.  Has secondhand smoke exposure from his wife. - No family history of malignancies.    Plan: 1.  Stage III high-grade follicular lymphoma (grade 3A): - He has completed 3 cycles of Bendamustine and rituximab. - We reviewed PET scan from 04/09/2023 which showed Deauville 2 response with no residual metabolic activity in the neck, chest, abdomen or pelvic lymph nodes.  Marked reduction in size of previously described mediastinal and periaortic lymph nodes. - He reports having severe weakness. - I have recommended that we stop chemotherapy at this time due to poor tolerance. - We discussed close surveillance with follow-up visit in 3 months with repeat labs and exam.  Will plan to repeat PET scan in 6 months. - Refer to physical therapy evaluation and management for fatigue and deconditioning from chemotherapy. - We also discussed incidental finding of metabolic activity in the medial aspect of the right parotid gland favoring parotid neoplasm.  He wants to hold off on ENT referral at this time.   2.  TLS prophylaxis: - Uric acid today is normal.  Discontinue allopurinol.   3.  Left vocal cord paralysis: - Dr. Pollyann Kennedy did laryngoscopy on 10/30/2022 and recommended CT soft tissue neck with contrast.   4.  Diabetes: - Continue glipizide 10 mg daily and metformin 750 mg twice  daily.    Orders Placed This Encounter  Procedures   CBC with Differential    Standing Status:   Future    Standing Expiration Date:   04/13/2024   Comprehensive metabolic panel    Standing Status:   Future    Standing Expiration Date:   04/13/2024   Lactate dehydrogenase     Standing Status:   Future    Standing Expiration Date:   04/13/2024      I,Katie Daubenspeck,acting as a scribe for Doreatha Massed, MD.,have documented all relevant documentation on the behalf of Doreatha Massed, MD,as directed by  Doreatha Massed, MD while in the presence of Doreatha Massed, MD.   I, Doreatha Massed MD, have reviewed the above documentation for accuracy and completeness, and I agree with the above.   Doreatha Massed, MD   5/14/20246:09 PM  CHIEF COMPLAINT:   Diagnosis: 3A follicular lymphoma, stage III    Cancer Staging  Follicular lymphoma grade 3a (HCC) Staging form: Hodgkin and Non-Hodgkin Lymphoma, AJCC 8th Edition - Clinical stage from 12/23/2022: Stage III (Follicular lymphoma) - Signed by Doreatha Massed, MD on 12/23/2022    Prior Therapy: none  Current Therapy:  Bendamustine and rituximab    HISTORY OF PRESENT ILLNESS:   Oncology History  Follicular lymphoma grade 3a (HCC)  12/23/2022 Initial Diagnosis   Follicular lymphoma grade 3a (HCC)   12/23/2022 Cancer Staging   Staging form: Hodgkin and Non-Hodgkin Lymphoma, AJCC 8th Edition - Clinical stage from 12/23/2022: Stage III (Follicular lymphoma) - Signed by Doreatha Massed, MD on 12/23/2022 Histopathologic type: Follicular lymphoma, grade 3 Stage prefix: Initial diagnosis   01/07/2023 -  Chemotherapy   Patient is on Treatment Plan : NON-HODGKINS LYMPHOMA Rituximab D1 + Bendamustine D1,2 q28d x 6 cycles        INTERVAL HISTORY:   Alejandro [name] is a 82 y.o. male presenting to clinic today for follow up of 3A follicular lymphoma, stage III. He was last seen by me on 04/01/23.  Since his last visit, he underwent restaging PET scan on 04/09/23 showing: no residual hypermetabolic activity within lymph nodes; marked reduction in size of previous mediastinal and periaortic retroperitoneal lymphadenopathy; single focus of metabolic activity within medial right parotid gland, favor  primary parotid neoplasm.  Today, he states that he is doing well overall. His appetite level is at 100%. His energy level is at 10%.  PAST MEDICAL HISTORY:   Past Medical History: Past Medical History:  Diagnosis Date   A-fib (HCC)    AAA Ultrasound 01/11/2020    AAA [place] 01/11/20:  no abdominal aortic aneurysm, 2.4 cm   Aortic insufficiency    a. mild-mod by intraop TEE 05/2018 (no aortic stenosis) // Echo 2/21: mild //  Echo 9/22: trivial   Aortic stenosis    Echo 9/22: mild (mean 16) // Echo 2/21: mild (mean 18.7)   CAD (coronary artery disease)    a. NSTEMI 05/2018 with multivessel disease -> s/p CABGx4 05/11/18 // Myoview 06/07/20: inf scar w mild peri-infarct ischemia, EF 37; Int Risk   Cancer (HCC)    Carotid artery disease (HCC)    PreCABG [place] 05/07/18: Bilat ICA 1-39   CKD (chronic kidney disease), stage III (HCC)    HFimpEF (heart failure with improved EF)    Echocardiogram 01/11/20: EF 45, Gr 3 DD, mild AI, mild AS (mean 18.7 mmHg) // Echocardiogram 08/14/21:  EF 55-60, inf HK, mild LVH, Gr 1 DD, normal RVSF, RVSP 25.3, mild LAE, trivial MR, MAC, trivial AI,  mild AS (mean 16 mmHg, Vmax 283 cm/s, DI 0.41)   History of nephrolithiasis 2004   Hyperlipidemia    Hypertension    IBS (irritable colon syndrome)    Ischemic cardiomyopathy    Mitral regurgitation    Echo 9/22: trivial MR   Postoperative atrial fibrillation (HCC)    PVC's (premature ventricular contractions)    Reflux    Type 2 diabetes mellitus (HCC)     Surgical History: Past Surgical History:  Procedure Laterality Date   BRONCHIAL NEEDLE ASPIRATION BIOPSY  11/17/2022   Procedure: BRONCHIAL NEEDLE ASPIRATION BIOPSIES;  Surgeon: Leslye Peer, MD;  Location: [facility] ENDOSCOPY;  Service: Cardiopulmonary;;   CARDIOVERSION     CORONARY ARTERY BYPASS GRAFT N/A 05/11/2018   Procedure: CORONARY ARTERY BYPASS GRAFTING (CABG), on pump, times four, using left internal mammary artery and endoscopically harvested right greater  saphenous leg vein.;  Surgeon: Kerin Perna, MD;  Location: [facility] OR;  Service: Open Heart Surgery;  Laterality: N/A;   ESOPHAGOGASTRODUODENOSCOPY  02/04/2012   Procedure: ESOPHAGOGASTRODUODENOSCOPY (EGD);  Surgeon: Malissa Hippo, MD;  Location: [facility] ENDO SUITE;  Service: Endoscopy;  Laterality: N/A;  100   INGUINAL LYMPH NODE BIOPSY Left 12/15/2022   Procedure: INGUINAL LYMPH NODE BIOPSY;  Surgeon: Lewie Chamber, DO;  Location: [facility] ORS;  Service: General;  Laterality: Left;   LEFT HEART CATH AND CORONARY ANGIOGRAPHY N/A 05/04/2018   Procedure: LEFT HEART CATH AND CORONARY ANGIOGRAPHY;  Surgeon: Lyn Records, MD;  Location: [facility] INVASIVE CV LAB;  Service: Cardiovascular;  Laterality: N/A;   NASAL FRACTURE SURGERY     PORTACATH PLACEMENT Right 01/06/2023   Procedure: INSERTION PORT-A-CATH;  Surgeon: Lewie Chamber, DO;  Location: [facility] ORS;  Service: General;  Laterality: Right;   TEE WITHOUT CARDIOVERSION N/A 05/11/2018   Procedure: TRANSESOPHAGEAL ECHOCARDIOGRAM (TEE);  Surgeon: Donata Clay, Theron Arista, MD;  Location: [facility];  Service: Open Heart Surgery;  Laterality: N/A;   VIDEO BRONCHOSCOPY WITH ENDOBRONCHIAL ULTRASOUND Right 11/17/2022   Procedure: VIDEO BRONCHOSCOPY WITH ENDOBRONCHIAL ULTRASOUND;  Surgeon: Leslye Peer, MD;  Location: [facility] ENDOSCOPY;  Service: Cardiopulmonary;  Laterality: Right;    Social History: Social History   Socioeconomic History   Marital status: Widowed    Spouse name: Not on file   Number of children: Not on file   Years of education: Not on file   Highest education level: Not on file  Occupational History   Not on file  Tobacco Use   Smoking status: Never   Smokeless tobacco: Never  Vaping Use   Vaping Use: Never used  Substance and Sexual Activity   Alcohol use: No    Alcohol/week: 0.0 standard drinks of alcohol   Drug use: No   Sexual activity: Yes  Other Topics Concern   Not on file  Social History Narrative   Not on file   Social  Determinants of Health   Financial Resource Strain: Not on file  Food Insecurity: Not on file  Transportation Needs: Not on file  Physical Activity: Not on file  Stress: Not on file  Social Connections: Not on file  Intimate Partner Violence: Not on file    Family History: Family History  Problem Relation Age of Onset   Diabetes Mellitus II Mother    Heart attack Father    Heart attack Brother    Heart disease Other    Arthritis Other    Asthma Other    Diabetes Other     Current Medications:  Current Outpatient Medications:    albuterol (VENTOLIN HFA) 108 (90 Base) MCG/ACT inhaler, Inhale 2 puffs into the lungs every 6 (six) hours as needed for wheezing or shortness of breath., Disp: , Rfl:    allopurinol (ZYLOPRIM) 300 MG tablet, Take 1 tablet (300 mg total) by mouth daily., Disp: 30 tablet, Rfl: 3   apixaban (ELIQUIS) 5 MG TABS tablet, Take 1 tablet (5 mg total) by mouth 2 (two) times daily., Disp: 60 tablet, Rfl: 5   atorvastatin (LIPITOR) 40 MG tablet, Take 40 mg by mouth at bedtime., Disp: , Rfl:    bisacodyl (DULCOLAX) 5 MG EC tablet, Take 5 mg by mouth daily as needed for moderate constipation., Disp: , Rfl:    cyanocobalamin (VITAMIN B12) 1000 MCG tablet, Take 1,000 mcg by mouth daily., Disp: , Rfl:    docusate sodium (COLACE) 100 MG capsule, Take 100 mg by mouth daily as needed for mild constipation., Disp: , Rfl:    ferrous sulfate 325 (65 FE) MG EC tablet, Take 1 tablet (325 mg total) by mouth daily with breakfast., Disp: 100 tablet, Rfl: 1   fluticasone (FLONASE) 50 MCG/ACT nasal spray, Place 1 spray into both nostrils daily., Disp: 11.1 mL, Rfl: 2   furosemide (LASIX) 80 MG tablet, Take 1 tablet (80 mg total) by mouth daily., Disp: 30 tablet, Rfl: 2   glipiZIDE (GLUCOTROL XL) 10 MG 24 hr tablet, Take 10 mg by mouth daily., Disp: , Rfl:    loratadine (CLARITIN) 10 MG tablet, Take 10 mg by mouth daily., Disp: , Rfl:    Melatonin 10 MG CAPS, Take 30 mg by mouth at  bedtime as needed (sleep)., Disp: , Rfl:    metFORMIN (GLUCOPHAGE-XR) 750 MG 24 hr tablet, Take 1 tablet by mouth 2 (two) times daily with a meal., Disp: , Rfl:    oxyCODONE (ROXICODONE) 5 MG immediate release tablet, Take 1 tablet (5 mg total) by mouth every 6 (six) hours as needed. (Patient taking differently: Take 5 mg by mouth every 6 (six) hours as needed for severe pain or moderate pain.), Disp: 12 tablet, Rfl: 0   pantoprazole (PROTONIX) 40 MG tablet, Take 40 mg by mouth daily., Disp: , Rfl:    potassium chloride (MICRO-K) 10 MEQ CR capsule, Take 10 mEq by mouth daily., Disp: , Rfl:    Povidone, PF, (IVIZIA DRY EYES) 0.5 % SOLN, Place 1 drop into both eyes daily as needed (dry eyes)., Disp: , Rfl:    sacubitril-valsartan (ENTRESTO) 49-51 MG, Take 1 tablet by mouth 2 (two) times daily., Disp: 60 tablet, Rfl: 2   sodium chloride (OCEAN) 0.65 % SOLN nasal spray, Place 1 spray into both nostrils as needed for congestion., Disp: 44 mL, Rfl: 2   spironolactone (ALDACTONE) 25 MG tablet, Take 0.5 tablets (12.5 mg total) by mouth daily., Disp: 45 tablet, Rfl: 3   prochlorperazine (COMPAZINE) 10 MG tablet, Take 1 tablet (10 mg total) by mouth every 6 (six) hours as needed for nausea or vomiting. (Patient not taking: Reported on 04/14/2023), Disp: 60 tablet, Rfl: 1 No current facility-administered medications for this visit.  Facility-Administered Medications Ordered in Other Visits:    sodium chloride flush (NS) 0.9 % injection 10 mL, 10 mL, Intracatheter, PRN, Doreatha Massed, MD, 10 mL at 01/07/23 1446   Allergies: No Known Allergies  REVIEW OF SYSTEMS:   Review of Systems  Constitutional:  Positive for fatigue. Negative for chills and fever.  HENT:   Negative for lump/mass, mouth sores, nosebleeds, sore  throat and trouble swallowing.   Eyes:  Negative for eye problems.  Respiratory:  Positive for shortness of breath. Negative for cough.   Cardiovascular:  Positive for palpitations.  Negative for chest pain and leg swelling.  Gastrointestinal:  Positive for constipation. Negative for abdominal pain, diarrhea, nausea and vomiting.  Genitourinary:  Negative for bladder incontinence, difficulty urinating, dysuria, frequency, hematuria and nocturia.   Musculoskeletal:  Negative for arthralgias, back pain, flank pain, myalgias and neck pain.  Skin:  Negative for itching and rash.  Neurological:  Negative for dizziness, headaches and numbness.  Hematological:  Does not bruise/bleed easily.  Psychiatric/Behavioral:  Negative for depression, sleep disturbance and suicidal ideas. The patient is not nervous/anxious.   All other systems reviewed and are negative.    VITALS:   There were no vitals taken for this visit.  Wt Readings from Last 3 Encounters:  04/01/23 230 lb 3.2 oz (104.4 kg)  03/04/23 225 lb 9.6 oz (102.3 kg)  02/24/23 227 lb 3.2 oz (103.1 kg)    There is no height or weight on file to calculate BMI.  Performance status (ECOG): 2 - Symptomatic, <50% confined to bed  PHYSICAL EXAM:   Physical Exam Vitals and nursing note reviewed. Exam conducted with a chaperone present.  Constitutional:      Appearance: Normal appearance.  Cardiovascular:     Rate and Rhythm: Normal rate and regular rhythm.     Pulses: Normal pulses.     Heart sounds: Normal heart sounds.  Pulmonary:     Effort: Pulmonary effort is normal.     Breath sounds: Normal breath sounds.  Abdominal:     Palpations: Abdomen is soft. There is no hepatomegaly, splenomegaly or mass.     Tenderness: There is no abdominal tenderness.  Musculoskeletal:     Right lower leg: No edema.     Left lower leg: No edema.  Lymphadenopathy:     Cervical: No cervical adenopathy.     Right cervical: No superficial, deep or posterior cervical adenopathy.    Left cervical: No superficial, deep or posterior cervical adenopathy.     Upper Body:     Right upper body: No supraclavicular or axillary adenopathy.      Left upper body: No supraclavicular or axillary adenopathy.  Neurological:     General: No focal deficit present.     Mental Status: He is alert and oriented to person, place, and time.  Psychiatric:        Mood and Affect: Mood normal.        Behavior: Behavior normal.     LABS:      Latest Ref Rng & Units 04/14/2023   10:57 AM 04/01/2023    7:45 AM 03/04/2023    7:39 AM  CBC  WBC 4.0 - 10.5 K/uL 8.6  7.4  7.9   Hemoglobin 13.0 - 17.0 g/dL 16.1  09.6  04.5   Hematocrit 39.0 - 52.0 % 37.0  36.5  37.7   Platelets 150 - 400 K/uL 196  285  291       Latest Ref Rng & Units 04/14/2023   10:57 AM 04/01/2023    7:45 AM 03/04/2023    7:39 AM  CMP  Glucose 70 - 99 mg/dL 409  811  914   BUN 8 - 23 mg/dL 16  14  16    Creatinine 0.61 - 1.24 mg/dL 7.82  9.56  2.13   Sodium 135 - 145 mmol/L 131  133  131  Potassium 3.5 - 5.1 mmol/L 4.1  3.7  3.9   Chloride 98 - 111 mmol/L 101  101  99   CO2 22 - 32 mmol/L 22  23  22    Calcium 8.9 - 10.3 mg/dL 8.6  8.6  8.6   Total Protein 6.5 - 8.1 g/dL 6.6  6.7  6.9   Total Bilirubin 0.3 - 1.2 mg/dL 0.7  0.5  0.6   Alkaline Phos 38 - 126 U/L 93  94  95   AST 15 - 41 U/L 18  20  20    ALT 0 - 44 U/L 18  17  19       No results found for: "CEA1", "CEA" / No results found for: "CEA1", "CEA" No results found for: "PSA1" No results found for: "WUJ811" No results found for: "CAN125"  No results found for: "TOTALPROTELP", "ALBUMINELP", "A1GS", "A2GS", "BETS", "BETA2SER", "GAMS", "MSPIKE", "SPEI" Lab Results  Component Value Date   TIBC 361 02/04/2023   FERRITIN 47 02/04/2023   IRONPCTSAT 13 (L) 02/04/2023   Lab Results  Component Value Date   LDH 146 04/14/2023   LDH 129 04/01/2023   LDH 128 03/04/2023     STUDIES:   NM PET Image Restag (PS) Skull Base To Thigh  Result Date: 04/09/2023 CLINICAL DATA:  Subsequent treatment strategy for non-Hodgkin's lymphoma. Assess therapy response. EXAM: NUCLEAR MEDICINE PET SKULL BASE TO THIGH TECHNIQUE: 11.8  mCi F-18 FDG was injected intravenously. Full-ring PET imaging was performed from the skull base to thigh after the radiotracer. CT data was obtained and used for attenuation correction and anatomic localization. Fasting blood glucose: 259 mg/dl COMPARISON:  None Available. FINDINGS: Mediastinal blood pool activity: SUV max 2.96 Liver activity: SUV max 4.0 NECK: Resolution hypermetabolic activity associated with small bilateral level III lymph node seen on comparison PET-CT scan. Resolution of a symmetric vocal cord activity seen on comparison exam. Favor relief of impingement of LEFT recurrent laryngeal nerve. There is persistent hypermetabolic nodule in the medial aspect of the RIGHT parotid gland with SUV max equal 6.0. There is a high-density rounded lesion measuring 15 mm image 34/3. Favor primary parotid neoplasm. Incidental CT findings: None. CHEST: Resolution previously seen hypermetabolic paratracheal lymph nodes. No residual hypermetabolic adenopathy remains in the mediastinum. Incidental CT findings: No suspicious pulmonary nodules ABDOMEN/PELVIS: Marked reduction in size and metabolic activity of bulky periaortic retroperitoneal adenopathy seen on comparison exam. For example tissue LEFT of the aorta at the level of the kidney measures 7 mm compared to 20 mm (178/4). The residual tissue has no metabolic activity above adjacent aortic blood pool activity. No hypermetabolic lymph nodes in the pelvis. No hypermetabolic inguinal nodes. Incidental CT findings: Bilateral hydroceles. SKELETON: No focal hypermetabolic activity to suggest skeletal metastasis. Incidental CT findings: IMPRESSION: 1. No residual hypermetabolic activity within neck, chest, abdomen or pelvic lymph nodes above background blood pool activity ( Deauville 2). 2. Marked reduction in size of previous described mediastinal and periaortic retroperitoneal lymph nodes. 3. Single focus of metabolic activity remains within the medial aspect of  the RIGHT parotid gland. Favor primary parotid neoplasm. Consider ENT consultation to evaluate for benign or malignant parotid neoplasm. Electronically Signed   By: Genevive Bi M.D.   On: 04/09/2023 10:37

## 2023-04-14 ENCOUNTER — Inpatient Hospital Stay: Payer: Medicare Other

## 2023-04-14 ENCOUNTER — Inpatient Hospital Stay (HOSPITAL_BASED_OUTPATIENT_CLINIC_OR_DEPARTMENT_OTHER): Payer: Medicare Other | Admitting: Hematology

## 2023-04-14 VITALS — BP 124/74 | HR 74 | Temp 96.4°F | Resp 20

## 2023-04-14 DIAGNOSIS — C8238 Follicular lymphoma grade IIIa, lymph nodes of multiple sites: Secondary | ICD-10-CM

## 2023-04-14 DIAGNOSIS — C8230 Follicular lymphoma grade IIIa, unspecified site: Secondary | ICD-10-CM | POA: Diagnosis not present

## 2023-04-14 DIAGNOSIS — Z95828 Presence of other vascular implants and grafts: Secondary | ICD-10-CM

## 2023-04-14 LAB — LACTATE DEHYDROGENASE: LDH: 146 U/L (ref 98–192)

## 2023-04-14 LAB — CBC WITH DIFFERENTIAL/PLATELET
Abs Immature Granulocytes: 0.07 10*3/uL (ref 0.00–0.07)
Basophils Absolute: 0 10*3/uL (ref 0.0–0.1)
Basophils Relative: 0 %
Eosinophils Absolute: 0 10*3/uL (ref 0.0–0.5)
Eosinophils Relative: 1 %
HCT: 37 % — ABNORMAL LOW (ref 39.0–52.0)
Hemoglobin: 12.6 g/dL — ABNORMAL LOW (ref 13.0–17.0)
Immature Granulocytes: 1 %
Lymphocytes Relative: 19 %
Lymphs Abs: 1.6 10*3/uL (ref 0.7–4.0)
MCH: 30.7 pg (ref 26.0–34.0)
MCHC: 34.1 g/dL (ref 30.0–36.0)
MCV: 90 fL (ref 80.0–100.0)
Monocytes Absolute: 1.1 10*3/uL — ABNORMAL HIGH (ref 0.1–1.0)
Monocytes Relative: 13 %
Neutro Abs: 5.7 10*3/uL (ref 1.7–7.7)
Neutrophils Relative %: 66 %
Platelets: 196 10*3/uL (ref 150–400)
RBC: 4.11 MIL/uL — ABNORMAL LOW (ref 4.22–5.81)
RDW: 14.9 % (ref 11.5–15.5)
WBC: 8.6 10*3/uL (ref 4.0–10.5)
nRBC: 0 % (ref 0.0–0.2)

## 2023-04-14 LAB — COMPREHENSIVE METABOLIC PANEL
ALT: 18 U/L (ref 0–44)
AST: 18 U/L (ref 15–41)
Albumin: 3.6 g/dL (ref 3.5–5.0)
Alkaline Phosphatase: 93 U/L (ref 38–126)
Anion gap: 8 (ref 5–15)
BUN: 16 mg/dL (ref 8–23)
CO2: 22 mmol/L (ref 22–32)
Calcium: 8.6 mg/dL — ABNORMAL LOW (ref 8.9–10.3)
Chloride: 101 mmol/L (ref 98–111)
Creatinine, Ser: 1.04 mg/dL (ref 0.61–1.24)
GFR, Estimated: >60 mL/min (ref >60)
Glucose, Bld: 258 mg/dL — ABNORMAL HIGH (ref 70–99)
Potassium: 4.1 mmol/L (ref 3.5–5.1)
Sodium: 131 mmol/L — ABNORMAL LOW (ref 135–145)
Total Bilirubin: 0.7 mg/dL (ref 0.3–1.2)
Total Protein: 6.6 g/dL (ref 6.5–8.1)

## 2023-04-14 LAB — MAGNESIUM: Magnesium: 2 mg/dL (ref 1.7–2.4)

## 2023-04-14 LAB — URIC ACID: Uric Acid, Serum: 2.9 mg/dL — ABNORMAL LOW (ref 3.7–8.6)

## 2023-04-14 MED ORDER — HEPARIN SOD (PORK) LOCK FLUSH 100 UNIT/ML IV SOLN
500.0000 [IU] | Freq: Once | INTRAVENOUS | Status: AC
  Administered 2023-04-14: 500 [IU] via INTRAVENOUS

## 2023-04-14 MED ORDER — SODIUM CHLORIDE 0.9% FLUSH
10.0000 mL | Freq: Once | INTRAVENOUS | Status: AC
  Administered 2023-04-14: 10 mL via INTRAVENOUS

## 2023-04-14 NOTE — Progress Notes (Signed)
Message received from A. Anderson RN / Dr. Denver Faster patient declining further treatments at this time. Discharged from clinic by wheel chair in stable condition. Alert and oriented x 3. F/U with [facility] as scheduled.

## 2023-04-14 NOTE — Patient Instructions (Signed)
[facility] at [facility] Discharge Instructions   You were seen and examined today by Dr. Ellin Saba.  He reviewed the results of your lab work which are normal/stable.   He reviewed the results of your PET scan that shows that the lymphoma has completely resolved. However, there is a spot in your parotid gland that is lighting up that is unrelated to the lymphoma. There could be a tumor there. We can refer you to an ENT doctor to evaluate this further. These are typically slow growing tumors.   We will discontinue any further treatments at this time since you are feeling so tired and weak and had such a good response to the treatments you've received.   We will refer you to physical therapy to help build your strength back up.   Return as scheduled.    Thank you for choosing [facility] at [facility] to provide your oncology and hematology care.  To afford each patient quality time with our provider, please arrive at least 15 minutes before your scheduled appointment time.   If you have a lab appointment with the Cancer Center please come in thru the Main Entrance and check in at the main information desk.  You need to re-schedule your appointment should you arrive 10 or more minutes late.  We strive to give you quality time with our providers, and arriving late affects you and other patients whose appointments are after yours.  Also, if you no show three or more times for appointments you may be dismissed from the clinic at the providers discretion.     Again, thank you for choosing [facility].  Our hope is that these requests will decrease the amount of time that you wait before being seen by our physicians.       _____________________________________________________________  Should you have questions after your visit to [facility], please contact our office at [phone] and follow the prompts.  Our office  hours are 8:00 a.m. and 4:30 p.m. Monday - Friday.  Please note that voicemails left after 4:00 p.m. may not be returned until the following business day.  We are closed weekends and major holidays.  You do have access to a nurse 24-7, just call the main number to the clinic [phone] and do not press any options, hold on the line and a nurse will answer the phone.    For prescription refill requests, have your pharmacy contact our office and allow 72 hours.    Due to Covid, you will need to wear a mask upon entering the hospital. If you do not have a mask, a mask will be given to you at the Main Entrance upon arrival. For doctor visits, patients may have 1 support person age 56 or older with them. For treatment visits, patients can not have anyone with them due to social distancing guidelines and our immunocompromised population.

## 2023-04-15 ENCOUNTER — Inpatient Hospital Stay: Payer: Medicare Other

## 2023-04-15 ENCOUNTER — Other Ambulatory Visit: Payer: Self-pay

## 2023-04-16 ENCOUNTER — Inpatient Hospital Stay: Payer: Medicare Other

## 2023-04-19 ENCOUNTER — Other Ambulatory Visit: Payer: Self-pay | Admitting: Surgery

## 2023-04-22 IMAGING — DX DG CHEST 1V PORT
1 series · 1 of 1 positions shown · non-contrast
Comparison: Chest radiograph 01/03/2020

CLINICAL DATA: Questionable sepsis

EXAM:
PORTABLE CHEST 1 VIEW

[chest ap]
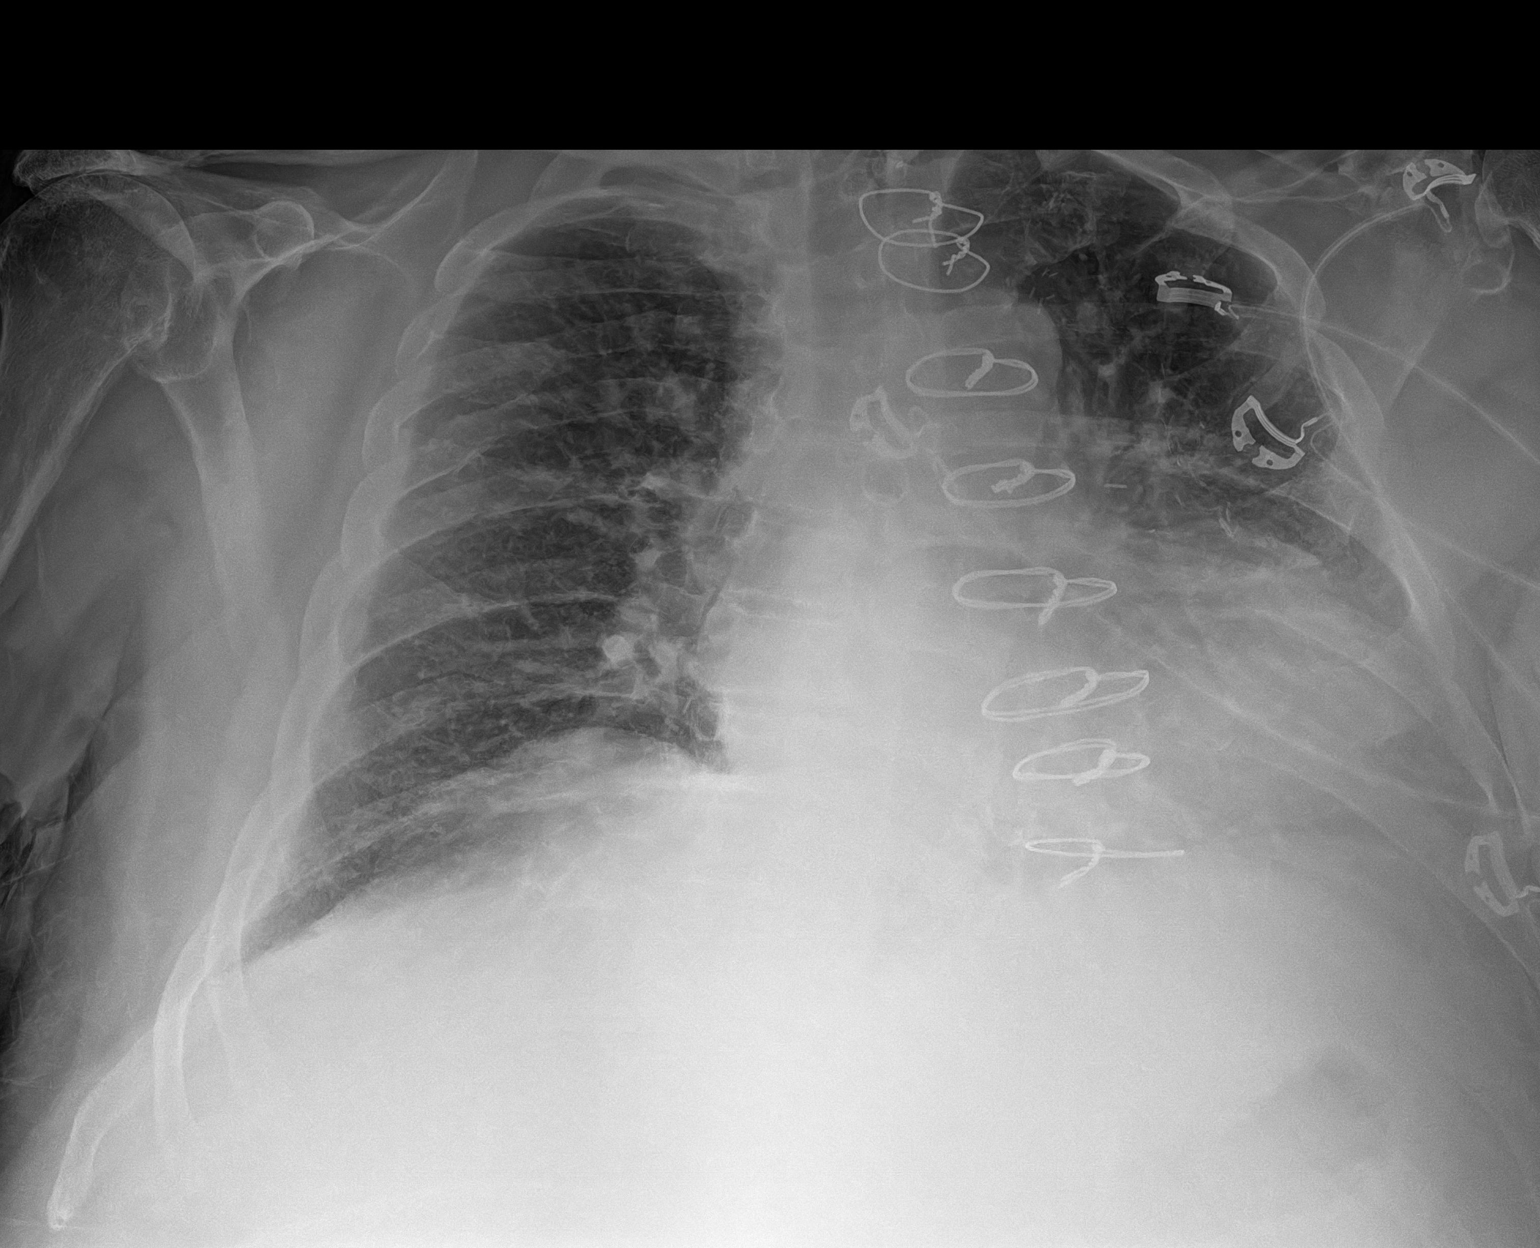

[1 of 1 positions shown; findings below may reference images not displayed]

FINDINGS: Median sternotomy wires and mediastinal surgical clips are again
noted.

The heart is enlarged, not significantly changed. The mediastinal
contours are otherwise within normal limits.

There is vascular congestion without frank pulmonary edema. There is
no focal consolidation. There is no significant pleural effusion.
There is no appreciable pneumothorax.

The bones are unremarkable.
IMPRESSION: Cardiomegaly and  vascular congestion without frank pulmonary edema.

## 2023-04-29 ENCOUNTER — Other Ambulatory Visit: Payer: Medicare Other

## 2023-04-29 ENCOUNTER — Ambulatory Visit: Payer: Medicare Other

## 2023-04-29 ENCOUNTER — Ambulatory Visit: Payer: Medicare Other | Admitting: Hematology

## 2023-04-30 ENCOUNTER — Other Ambulatory Visit: Payer: Self-pay

## 2023-04-30 ENCOUNTER — Ambulatory Visit: Payer: Medicare Other

## 2023-05-01 ENCOUNTER — Ambulatory Visit: Payer: Medicare Other
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
## 2023-05-17 ENCOUNTER — Encounter (HOSPITAL_COMMUNITY): Payer: Self-pay

## 2023-05-17 ENCOUNTER — Emergency Department (HOSPITAL_COMMUNITY)
Admission: EM | Admit: 2023-05-17 | Discharge: 2023-05-17 | Disposition: A | Discharge status: Home / Self Care | Payer: Medicare Other | Attending: Emergency Medicine | Admitting: Emergency Medicine

## 2023-05-17 ENCOUNTER — Emergency Department (HOSPITAL_COMMUNITY): Payer: Medicare Other

## 2023-05-17 ENCOUNTER — Other Ambulatory Visit: Payer: Self-pay

## 2023-05-17 DIAGNOSIS — Z85828 Personal history of other malignant neoplasm of skin: Secondary | ICD-10-CM | POA: Insufficient documentation

## 2023-05-17 DIAGNOSIS — Z79899 Other long term (current) drug therapy: Secondary | ICD-10-CM | POA: Diagnosis not present

## 2023-05-17 DIAGNOSIS — I4891 Unspecified atrial fibrillation: Secondary | ICD-10-CM | POA: Insufficient documentation

## 2023-05-17 DIAGNOSIS — I10 Essential (primary) hypertension: Secondary | ICD-10-CM | POA: Insufficient documentation

## 2023-05-17 DIAGNOSIS — R0602 Shortness of breath: Secondary | ICD-10-CM | POA: Diagnosis not present

## 2023-05-17 DIAGNOSIS — E119 Type 2 diabetes mellitus without complications: Secondary | ICD-10-CM | POA: Insufficient documentation

## 2023-05-17 DIAGNOSIS — Z7901 Long term (current) use of anticoagulants: Secondary | ICD-10-CM | POA: Insufficient documentation

## 2023-05-17 DIAGNOSIS — I509 Heart failure, unspecified: Secondary | ICD-10-CM | POA: Insufficient documentation

## 2023-05-17 DIAGNOSIS — I2510 Atherosclerotic heart disease of native coronary artery without angina pectoris: Secondary | ICD-10-CM | POA: Insufficient documentation

## 2023-05-17 LAB — CBC WITH DIFFERENTIAL/PLATELET
Abs Immature Granulocytes: 0.09 10*3/uL — ABNORMAL HIGH (ref 0.00–0.07)
Basophils Absolute: 0 10*3/uL (ref 0.0–0.1)
Basophils Relative: 0 %
Eosinophils Absolute: 0 10*3/uL (ref 0.0–0.5)
Eosinophils Relative: 0 %
HCT: 37 % — ABNORMAL LOW (ref 39.0–52.0)
Hemoglobin: 12.3 g/dL — ABNORMAL LOW (ref 13.0–17.0)
Immature Granulocytes: 1 %
Lymphocytes Relative: 15 %
Lymphs Abs: 1.1 10*3/uL (ref 0.7–4.0)
MCH: 30.1 pg (ref 26.0–34.0)
MCHC: 33.2 g/dL (ref 30.0–36.0)
MCV: 90.7 fL (ref 80.0–100.0)
Monocytes Absolute: 1 10*3/uL (ref 0.1–1.0)
Monocytes Relative: 13 %
Neutro Abs: 5.1 10*3/uL (ref 1.7–7.7)
Neutrophils Relative %: 71 %
Platelets: 191 10*3/uL (ref 150–400)
RBC: 4.08 MIL/uL — ABNORMAL LOW (ref 4.22–5.81)
RDW: 14.4 % (ref 11.5–15.5)
WBC: 7.3 10*3/uL (ref 4.0–10.5)
nRBC: 0 % (ref 0.0–0.2)

## 2023-05-17 LAB — COMPREHENSIVE METABOLIC PANEL
ALT: 15 U/L (ref 0–44)
AST: 16 U/L (ref 15–41)
Albumin: 3.4 g/dL — ABNORMAL LOW (ref 3.5–5.0)
Alkaline Phosphatase: 86 U/L (ref 38–126)
Anion gap: 10 (ref 5–15)
BUN: 15 mg/dL (ref 8–23)
CO2: 24 mmol/L (ref 22–32)
Calcium: 8.2 mg/dL — ABNORMAL LOW (ref 8.9–10.3)
Chloride: 101 mmol/L (ref 98–111)
Creatinine, Ser: 0.94 mg/dL (ref 0.61–1.24)
GFR, Estimated: >60 mL/min (ref >60)
Glucose, Bld: 246 mg/dL — ABNORMAL HIGH (ref 70–99)
Potassium: 3.6 mmol/L (ref 3.5–5.1)
Sodium: 135 mmol/L (ref 135–145)
Total Bilirubin: 0.5 mg/dL (ref 0.3–1.2)
Total Protein: 6.2 g/dL — ABNORMAL LOW (ref 6.5–8.1)

## 2023-05-17 LAB — BRAIN NATRIURETIC PEPTIDE: B Natriuretic Peptide: 269 pg/mL — ABNORMAL HIGH (ref 0.0–100.0)

## 2023-05-17 LAB — TROPONIN I (HIGH SENSITIVITY)
Troponin I (High Sensitivity): 13 ng/L (ref <18)
Troponin I (High Sensitivity): 12 ng/L (ref <18)

## 2023-05-17 MED ORDER — IPRATROPIUM-ALBUTEROL 0.5-2.5 (3) MG/3ML IN SOLN
3.0000 mL | Freq: Once | RESPIRATORY_TRACT | Status: AC
  Administered 2023-05-17: 3 mL via RESPIRATORY_TRACT
  Filled 2023-05-17: qty 3

## 2023-05-17 MED ORDER — IPRATROPIUM-ALBUTEROL 0.5-2.5 (3) MG/3ML IN SOLN
3.0000 mL | Freq: Once | RESPIRATORY_TRACT | Status: AC
  Administered 2023-05-17: 3 mL via RESPIRATORY_TRACT

## 2023-05-17 MED ORDER — METHYLPREDNISOLONE 4 MG PO TBPK: ORAL_TABLET | Freq: Every day | ORAL | 0 refills | Status: AC

## 2023-05-17 MED ORDER — FUROSEMIDE 10 MG/ML IJ SOLN
20.0000 mg | Freq: Once | INTRAMUSCULAR | Status: AC
  Administered 2023-05-17: 20 mg via INTRAVENOUS
  Filled 2023-05-17: qty 2

## 2023-05-17 MED ORDER — METHYLPREDNISOLONE 4 MG PO TBPK: ORAL_TABLET | Freq: Every day | ORAL | 0 refills | Status: DC

## 2023-05-17 NOTE — ED Provider Notes (Signed)
Alejandro [name]   CSN: [id number] Arrival date & time: 05/17/23  1255     History Chief Complaint  Patient presents with   Shortness of Breath    Alejandro [name] is a 82 y.o. male with h/o A-fib on Eliquis, congestive heart failure, CAD, cancer in remission, hypertension, diabetes, hyperlipidemia presents the emergency room today for evaluation of shortness of breath.  Patient reports he has been on oxygen as needed after his cancer treatment.  He reports that he was never officially diagnosed with COPD or emphysema but they noticed that his oxygen would get low occasionally and gave him some whenever he needed it.  He has an oxygen tank at home.  On Friday, he reports that he was having some shortness of breath and went to his primary care doctor where they gave him a breathing treatment and he felt better.  They gave him prescription for nebulizers and nebulizer machine however did not have the machine.  Given that this was on a Friday, they were unable to pick up a nebulizer machine and he has been without his nebulizers.  He has been using his albuterol inhaler and finds some relief but not as much as he does with the nebulizer machine.  He denies any chest pain, weight gain, or lower extremity swelling.  He reports compliancy to his medications.  Denies any fevers, cough or cold symptoms.  He had a put on his oxygen this morning and did not like it was helping him any.   Shortness of Breath Associated symptoms: no abdominal pain, no chest pain, no cough, no fever and no vomiting        Home Medications Prior to Admission medications   Medication Sig Start Date End Date Taking? Authorizing Provider  acetaminophen (TYLENOL) 500 MG tablet Take 1,000 mg by mouth every 6 (six) hours as needed for moderate pain or headache.   Yes [provider]  albuterol (VENTOLIN HFA) 108 (90 Base) MCG/ACT inhaler Inhale 2 puffs into the  lungs every 6 (six) hours as needed for wheezing or shortness of breath. 09/22/22  Yes [provider]  allopurinol (ZYLOPRIM) 300 MG tablet Take 1 tablet (300 mg total) by mouth daily. 01/05/23  Yes Doreatha Massed, MD  apixaban (ELIQUIS) 5 MG TABS tablet Take 1 tablet (5 mg total) by mouth 2 (two) times daily. 01/08/23  Yes Pappayliou, Santina Evans A, DO  atorvastatin (LIPITOR) 40 MG tablet Take 40 mg by mouth at bedtime.   Yes [provider]  cyanocobalamin (VITAMIN B12) 1000 MCG tablet Take 1,000 mcg by mouth daily.   Yes [provider]  docusate sodium (COLACE) 100 MG capsule Take 300 mg by mouth daily as needed for mild constipation.   Yes [provider]  ferrous sulfate 325 (65 FE) MG EC tablet Take 1 tablet (325 mg total) by mouth daily with breakfast. 02/04/23  Yes Pennington, Rebekah M, PA-C  fluticasone (FLONASE) 50 MCG/ACT nasal spray Place 1 spray into both nostrils daily. 02/02/23  Yes Pennington, Rebekah M, PA-C  furosemide (LASIX) 80 MG tablet Take 1 tablet (80 mg total) by mouth daily. 02/17/23 05/18/23 Yes [place], Eric J, DO  glipiZIDE (GLUCOTROL XL) 10 MG 24 hr tablet Take 10 mg by mouth daily.   Yes [provider]  metFORMIN (GLUCOPHAGE-XR) 750 MG 24 hr tablet Take 1 tablet by mouth 2 (two) times daily with a meal. 01/05/23  Yes [provider]  pantoprazole (PROTONIX) 40 MG tablet Take 40 mg by mouth daily. 02/07/20  Yes [provider]  potassium chloride (MICRO-K) 10 MEQ CR capsule Take 10 mEq by mouth daily.   Yes [provider]  Povidone, PF, (IVIZIA DRY EYES) 0.5 % SOLN Place 1 drop into both eyes daily as needed (dry eyes).   Yes [provider]  sacubitril-valsartan (ENTRESTO) 49-51 MG Take 1 tablet by mouth 2 (two) times daily. 02/17/23 05/18/23 Yes [place], Eric J, DO  sodium chloride (OCEAN) 0.65 % SOLN nasal spray Place 1 spray into both nostrils as needed for congestion. 01/19/23  Yes Pennington,  Rebekah M, PA-C  spironolactone (ALDACTONE) 25 MG tablet Take 0.5 tablets (12.5 mg total) by mouth daily. 12/18/22 12/07/24 Yes Furth, Cadence H, PA-C  albuterol (ACCUNEB) 1.25 MG/3ML nebulizer solution Take 1 ampule by nebulization every 6 (six) hours as needed for wheezing or shortness of breath. 05/15/23   [provider]  bisacodyl (DULCOLAX) 5 MG EC tablet Take 5 mg by mouth daily as needed for moderate constipation. Patient not taking: Reported on 05/17/2023    [provider]  loratadine (CLARITIN) 10 MG tablet Take 10 mg by mouth daily. 04/25/22   [provider]  Melatonin 10 MG CAPS Take 30 mg by mouth at bedtime as needed (sleep).    [provider]  oxyCODONE (ROXICODONE) 5 MG immediate release tablet Take 1 tablet (5 mg total) by mouth every 6 (six) hours as needed. Patient not taking: Reported on 05/17/2023 12/15/22   Pappayliou, Santina Evans A, DO  prochlorperazine (COMPAZINE) 10 MG tablet Take 1 tablet (10 mg total) by mouth every 6 (six) hours as needed for nausea or vomiting. Patient not taking: Reported on 04/14/2023 01/05/23   Doreatha Massed, MD      Allergies    Patient has no known allergies.    Review of Systems   Review of Systems  Constitutional:  Negative for chills and fever.  HENT:  Negative for congestion and rhinorrhea.   Respiratory:  Positive for shortness of breath. Negative for cough.   Cardiovascular:  Negative for chest pain, palpitations and leg swelling.  Gastrointestinal:  Negative for abdominal pain, nausea and vomiting.    Physical Exam Updated Vital Signs BP (!) 154/74   Pulse 81   Temp 97.7 F (36.5 C) (Oral)   Resp 20   Ht 5\' 10"  (1.778 m)   Wt 104.4 kg   SpO2 97%   BMI 33.02 kg/m  Physical Exam Vitals and nursing [name] reviewed.  Constitutional:      Appearance: He is not toxic-appearing.  HENT:     Mouth/Throat:     Comments: Poor dentition Cardiovascular:     Rate and Rhythm: Normal rate.   Pulmonary:     Effort: Pulmonary effort is normal. No tachypnea.     Comments: Sounds diminished bilaterally.  Some fine end expiratory wheezing heard throughout. He is satting well on his  Chest:     Chest wall: No tenderness.  Abdominal:     Palpations: Abdomen is soft.  Musculoskeletal:     Right lower leg: No tenderness. No edema.     Left lower leg: No tenderness. No edema.  Skin:    General: Skin is warm and dry.  Neurological:     Mental Status: He is alert.     ED Results / Procedures / Treatments   Labs (all labs ordered are listed, but only abnormal results are displayed) Labs Reviewed  CBC  WITH DIFFERENTIAL/PLATELET - Abnormal; Notable for the following components:      Result Value   RBC 4.08 (*)    Hemoglobin 12.3 (*)    HCT 37.0 (*)    Abs Immature Granulocytes 0.09 (*)    All other components within normal limits  COMPREHENSIVE METABOLIC PANEL - Abnormal; Notable for the following components:   Glucose, Bld 246 (*)    Calcium 8.2 (*)    Total Protein 6.2 (*)    Albumin 3.4 (*)    All other components within normal limits  BRAIN NATRIURETIC PEPTIDE - Abnormal; Notable for the following components:   B Natriuretic Peptide 269.0 (*)    All other components within normal limits  TROPONIN I (HIGH SENSITIVITY)  TROPONIN I (HIGH SENSITIVITY)    EKG EKG Interpretation  Date/Time:  Sunday May 17 2023 13:10:51 EDT Ventricular Rate:  77 PR Interval:    QRS Duration: 130 QT Interval:  458 QTC Calculation: 518 R Axis:   48 Text Interpretation: nsr with frequent PVCs No significant change since last tracing Confirmed by Jacalyn Lefevre [phone]) on 05/17/2023 1:38:02 PM  Radiology DG Chest Port 1 View  Result Date: 05/17/2023 CLINICAL DATA:  Shortness of breath EXAM: PORTABLE CHEST 1 VIEW COMPARISON:  02/15/2023 FINDINGS: Right-sided chest port terminates at the level of the mid SVC. Stable cardiomegaly status post sternotomy and CABG. Diffusely increased  interstitial markings bilaterally. Possible small bilateral pleural effusions. No pneumothorax. IMPRESSION: Diffusely increased interstitial markings bilaterally favoring pulmonary edema. Electronically Signed   By: Duanne Guess D.O.   On: 05/17/2023 14:18    Procedures Procedures   Medications Ordered in ED Medications  ipratropium-albuterol (DUONEB) 0.5-2.5 (3) MG/3ML nebulizer solution 3 mL (3 mLs Nebulization Given 05/17/23 1508)  ipratropium-albuterol (DUONEB) 0.5-2.5 (3) MG/3ML nebulizer solution 3 mL (3 mLs Nebulization Given 05/17/23 1611)  furosemide (LASIX) injection 20 mg (20 mg Intravenous Given 05/17/23 1646)    ED Course/ Medical Decision Making/ A&P    Medical Decision Making Amount and/or Complexity of Data Reviewed Labs: ordered. Radiology: ordered.  Risk Prescription drug management.   82 y.o. male presents to the ER for evaluation of shortness of breath. Differential diagnosis includes but is not limited to CHF, pericardial effusion/tamponade, arrhythmias, ACS, COPD, asthma, bronchitis, pneumonia, pneumothorax, PE, anemia. Vital signs unremarkable. Physical exam as noted above.   Chest pain order set placed.  DuoNebs ordered.  I independently reviewed and interpreted the patient's labs.  CBC shows mild anemia with hemoglobin of 12.3.  No leukocytosis.  CMP shows a low glucose at 246.  Mildly decreased calcium, protein, and albumin.  Otherwise, no electrolyte or LFT abnormality.  BNP is elevated at 269, although is less in the 1 they had a few months prior.  Troponin at 13 with repeat at 12.  EKG reviewed and interpreted by an attending and read as nsr with frequent PVCs No significant change since last tracing.  CXR shows diffusely increased interstitial markings bilaterally favoring pulmonary edema.  Patient was given some IV Lasix.  His oxygen levels between 92 and 95% on room air but he is requesting to have his oxygen put on.  I recommended admission for his  bronchitis and congestive heart failure however he reports that he feels fine and better after the treatments and would like to go home.  Given that his oxygen is no longer needed and his lungs are sounding better and he is ambulatory without the setting, I discussed the can go home but needs  to return if he has any changes.  I discussed with him that the Medrol Dosepak will help with some of the wheezing heard as well probably from bronchitis.  Discussed with him that this will raise his blood sugars however and to be mindful of that.  I discussed with my attending and he reports he is stable for discharge home.  I question that last vital signs entered as he had a previous 1 charted just 8 minutes prior to that it was normal.  The patient not appear in any acute distress and ambulated. The patient was normotensive if not a little elevated during his time in the ER.  We discussed the results of the labs/imaging. The plan is to take steroids as needed.  He is going to pick up his nebulizer machine tomorrow when the store opens.  I recommended following up with his primary care doctor as well. We discussed strict return precautions and red flag symptoms. The patient verbalized their understanding and agrees to the plan. The patient is stable and being discharged home in good condition.  Portions of this report may have been transcribed using voice recognition software. Every effort was made to ensure accuracy; however, inadvertent computerized transcription errors may be present.   Final Clinical Impression(s) / ED Diagnoses Final diagnoses:  Shortness of breath    Rx / DC Orders ED Discharge Orders          Ordered    methylPREDNISolone (MEDROL DOSEPAK) 4 MG TBPK tablet  Daily,   Status:  Discontinued        05/17/23 1933    methylPREDNISolone (MEDROL DOSEPAK) 4 MG TBPK tablet  Daily        05/17/23 1947              Achille Rich, Cordelia Poche 05/20/23 Erling Conte, MD 05/22/23  1159

## 2023-05-17 NOTE — ED Triage Notes (Signed)
Pt comes in with complaints of SOB starting Friday was seen by PCP and the symptoms went away. Pt states SOB started again this morning awakening him from his sleep. Pt states having to use his PRN oxygen at 2L. Pt in remission from groin cancer.

## 2023-05-17 NOTE — Discharge Instructions (Addendum)
You were seen in the ER for evaluation of shortness of breath,I am glad hat you are feeling better. Please make sure that you follow up with your PCP as you may need to increase your Lasix. Please use your breathing treatments as needed. I am sending in some prednisone to help your wheezing as well. If you have any concerns, new or worsening symptoms. Please return to the nearest ER for re-evlaution.   Contact a doctor if: Your condition does not get better as soon as expected. You have a hard time doing your normal activities, even after you rest. You have new symptoms. You cannot walk up stairs. You cannot exercise the way you normally do. Get help right away if: Your shortness of breath gets worse. You have trouble breathing when you are resting. You feel light-headed or you faint. You have a cough that is not helped by medicines. You cough up blood. You have pain with breathing. You have pain in your chest, arms, shoulders, or belly (abdomen). You have a fever. These symptoms may be an emergency. Get help right away. Call 911. Do not wait to see if the symptoms will go away. Do not drive yourself to the hospital.

## 2023-05-17 NOTE — ED Provider Triage Note (Signed)
Emergency Medicine Provider Triage Evaluation Note  Alejandro [name] , a 82 y.o. male  was evaluated in triage.  Pt complains of SOB since Friday off and on. Went to PCP and was given a breathing treatment and felt fine. He reports that he woke up SOB and had to put had his oxygen. Feels like the oxygen isn't helping. No chest pain, cough, or recent fever. H/o afib on elliquis. Reports compliancy with his medications.  Review of Systems  Positive:  Negative:   Physical Exam  Ht 5\' 10"  (1.778 m)   Wt 104.4 kg   BMI 33.02 kg/m  Gen:   Awake, no distress   Resp:   MSK:   Moves extremities without difficulty  Other:  Lungs are clear, but do sound tight. No resp distress or tripoding.   Medical Decision Making  Medically screening exam initiated at 1:10 PM.  Appropriate orders placed.  Alejandro [name] was informed that the remainder of the evaluation will be completed by another provider, this initial triage assessment does not replace that evaluation, and the importance of remaining in the ED until their evaluation is complete.     Achille Rich, PA-C 05/17/23 1314

## 2023-05-17 NOTE — ED Notes (Signed)
Patient went to the bathroom without oxygen. Patient given another nebulizer upon request. Patient remained off of oxygen after treatment. He requested it back on. I told him to try to hold off SPO2 still 94%. Patient states he still feels ok but still slightly short of breath.

## 2023-05-18 ENCOUNTER — Encounter (HOSPITAL_COMMUNITY)
Admission: RE | Admit: 2023-05-18 | Discharge: 2023-05-18 | Disposition: A | Discharge status: Home / Self Care | Payer: Medicare Other | Source: Ambulatory Visit | Attending: Ophthalmology | Admitting: Ophthalmology

## 2023-05-19 ENCOUNTER — Ambulatory Visit (HOSPITAL_COMMUNITY): Payer: Medicare Other

## 2023-05-22 ENCOUNTER — Ambulatory Visit (HOSPITAL_COMMUNITY): Admission: RE | Admit: 2023-05-22 | Payer: Medicare Other | Source: Home / Self Care | Admitting: Ophthalmology

## 2023-05-22 ENCOUNTER — Encounter (HOSPITAL_COMMUNITY): Admission: RE | Payer: Self-pay | Source: Home / Self Care

## 2023-05-22 SURGERY — PHACOEMULSIFICATION, CATARACT, WITH IOL INSERTION
Anesthesia: Monitor Anesthesia Care | Laterality: Right

## 2023-06-06 ENCOUNTER — Other Ambulatory Visit: Payer: Self-pay

## 2023-06-06 ENCOUNTER — Emergency Department (HOSPITAL_COMMUNITY)
Admission: EM | Admit: 2023-06-06 | Discharge: 2023-06-06 | Disposition: A | Discharge status: Home / Self Care | Payer: Medicare Other | Attending: Emergency Medicine | Admitting: Emergency Medicine

## 2023-06-06 ENCOUNTER — Encounter (HOSPITAL_COMMUNITY): Payer: Self-pay | Admitting: Emergency Medicine

## 2023-06-06 ENCOUNTER — Emergency Department (HOSPITAL_COMMUNITY): Payer: Medicare Other

## 2023-06-06 DIAGNOSIS — E119 Type 2 diabetes mellitus without complications: Secondary | ICD-10-CM | POA: Insufficient documentation

## 2023-06-06 DIAGNOSIS — R0602 Shortness of breath: Secondary | ICD-10-CM

## 2023-06-06 DIAGNOSIS — Z7984 Long term (current) use of oral hypoglycemic drugs: Secondary | ICD-10-CM | POA: Insufficient documentation

## 2023-06-06 DIAGNOSIS — Z7901 Long term (current) use of anticoagulants: Secondary | ICD-10-CM | POA: Diagnosis not present

## 2023-06-06 DIAGNOSIS — I4891 Unspecified atrial fibrillation: Secondary | ICD-10-CM | POA: Insufficient documentation

## 2023-06-06 DIAGNOSIS — I130 Hypertensive heart and chronic kidney disease with heart failure and stage 1 through stage 4 chronic kidney disease, or unspecified chronic kidney disease: Secondary | ICD-10-CM | POA: Insufficient documentation

## 2023-06-06 DIAGNOSIS — N189 Chronic kidney disease, unspecified: Secondary | ICD-10-CM | POA: Insufficient documentation

## 2023-06-06 DIAGNOSIS — I2510 Atherosclerotic heart disease of native coronary artery without angina pectoris: Secondary | ICD-10-CM | POA: Insufficient documentation

## 2023-06-06 DIAGNOSIS — I509 Heart failure, unspecified: Secondary | ICD-10-CM | POA: Insufficient documentation

## 2023-06-06 LAB — CBC WITH DIFFERENTIAL/PLATELET
Abs Immature Granulocytes: 0.11 10*3/uL — ABNORMAL HIGH (ref 0.00–0.07)
Basophils Absolute: 0 10*3/uL (ref 0.0–0.1)
Basophils Relative: 0 %
Eosinophils Absolute: 0 10*3/uL (ref 0.0–0.5)
Eosinophils Relative: 0 %
HCT: 38.3 % — ABNORMAL LOW (ref 39.0–52.0)
Hemoglobin: 12.6 g/dL — ABNORMAL LOW (ref 13.0–17.0)
Immature Granulocytes: 1 %
Lymphocytes Relative: 14 %
Lymphs Abs: 1.3 10*3/uL (ref 0.7–4.0)
MCH: 29.6 pg (ref 26.0–34.0)
MCHC: 32.9 g/dL (ref 30.0–36.0)
MCV: 90.1 fL (ref 80.0–100.0)
Monocytes Absolute: 1.1 10*3/uL — ABNORMAL HIGH (ref 0.1–1.0)
Monocytes Relative: 12 %
Neutro Abs: 7.2 10*3/uL (ref 1.7–7.7)
Neutrophils Relative %: 73 %
Platelets: 204 10*3/uL (ref 150–400)
RBC: 4.25 MIL/uL (ref 4.22–5.81)
RDW: 14.2 % (ref 11.5–15.5)
WBC: 9.9 10*3/uL (ref 4.0–10.5)
nRBC: 0 % (ref 0.0–0.2)

## 2023-06-06 LAB — BASIC METABOLIC PANEL
Anion gap: 10 (ref 5–15)
BUN: 13 mg/dL (ref 8–23)
CO2: 25 mmol/L (ref 22–32)
Calcium: 8.6 mg/dL — ABNORMAL LOW (ref 8.9–10.3)
Chloride: 101 mmol/L (ref 98–111)
Creatinine, Ser: 0.96 mg/dL (ref 0.61–1.24)
GFR, Estimated: >60 mL/min (ref >60)
Glucose, Bld: 251 mg/dL — ABNORMAL HIGH (ref 70–99)
Potassium: 3.8 mmol/L (ref 3.5–5.1)
Sodium: 136 mmol/L (ref 135–145)

## 2023-06-06 LAB — BRAIN NATRIURETIC PEPTIDE: B Natriuretic Peptide: 326 pg/mL — ABNORMAL HIGH (ref 0.0–100.0)

## 2023-06-06 MED ORDER — FUROSEMIDE 10 MG/ML IJ SOLN
40.0000 mg | Freq: Once | INTRAMUSCULAR | Status: AC
  Administered 2023-06-06: 40 mg via INTRAVENOUS
  Filled 2023-06-06: qty 4

## 2023-06-06 NOTE — Discharge Instructions (Signed)
You have been given medication here help with fluid overload.  Please keep your upcoming appointment with for your heart procedure next week.  Return to the emergency department for any new or worsening symptoms.

## 2023-06-06 NOTE — ED Provider Notes (Signed)
[place] EMERGENCY DEPARTMENT AT [facility] Provider Note   CSN: [id number] Arrival date & time: 06/06/23  4098     History  Chief Complaint  Patient presents with   Shortness of Breath    Alejandro [name] is a 82 y.o. male.   Shortness of Breath Associated symptoms: no abdominal pain, no chest pain, no cough, no diaphoresis, no fever, no headaches and no vomiting        Alejandro [name] is a 82 y.o. male with past medical history of type 2 diabetes, hypertension, CKD, coronary artery disease,  atrial fib anticoagulated, and CHF who presents to the Emergency Department complaining of sudden shortness of breath this morning.  He wears 2L O2 continuously at baseline became short of breath after waking up  His granddaughter come by to check on him and found that his concentrator was not working properly and his O2 tank was empty.  She brought him here for further evaluation.   He was hypoxic on arrival here.  Denies chest pain or cough. He is scheduled to have cardiac cath next week.    Home Medications Prior to Admission medications   Medication Sig Start Date End Date Taking? Authorizing Provider  acetaminophen (TYLENOL) 500 MG tablet Take 1,000 mg by mouth every 6 (six) hours as needed for moderate pain or headache.    [provider]  albuterol (ACCUNEB) 1.25 MG/3ML nebulizer solution Take 1 ampule by nebulization every 6 (six) hours as needed for wheezing or shortness of breath. 05/15/23   [provider]  albuterol (VENTOLIN HFA) 108 (90 Base) MCG/ACT inhaler Inhale 2 puffs into the lungs every 6 (six) hours as needed for wheezing or shortness of breath. 09/22/22   [provider]  allopurinol (ZYLOPRIM) 300 MG tablet Take 1 tablet (300 mg total) by mouth daily. 01/05/23   Doreatha Massed, MD  apixaban (ELIQUIS) 5 MG TABS tablet Take 1 tablet (5 mg total) by mouth 2 (two) times daily. 01/08/23   Pappayliou, Santina Evans A, DO  atorvastatin  (LIPITOR) 40 MG tablet Take 40 mg by mouth at bedtime.    [provider]  bisacodyl (DULCOLAX) 5 MG EC tablet Take 5 mg by mouth daily as needed for moderate constipation. Patient not taking: Reported on 05/17/2023    [provider]  cyanocobalamin (VITAMIN B12) 1000 MCG tablet Take 1,000 mcg by mouth daily.    [provider]  docusate sodium (COLACE) 100 MG capsule Take 300 mg by mouth daily as needed for mild constipation.    [provider]  ferrous sulfate 325 (65 FE) MG EC tablet Take 1 tablet (325 mg total) by mouth daily with breakfast. 02/04/23   Rojelio Brenner M, PA-C  fluticasone (FLONASE) 50 MCG/ACT nasal spray Place 1 spray into both nostrils daily. 02/02/23   Carnella Guadalajara, PA-C  furosemide (LASIX) 80 MG tablet Take 1 tablet (80 mg total) by mouth daily. 02/17/23 05/18/23  [place], Eric J, DO  glipiZIDE (GLUCOTROL XL) 10 MG 24 hr tablet Take 10 mg by mouth daily.    [provider]  loratadine (CLARITIN) 10 MG tablet Take 10 mg by mouth daily. 04/25/22   [provider]  Melatonin 10 MG CAPS Take 30 mg by mouth at bedtime as needed (sleep).    [provider]  metFORMIN (GLUCOPHAGE-XR) 750 MG 24 hr tablet Take 1 tablet by mouth 2 (two) times daily with a meal. 01/05/23   [provider]  oxyCODONE (  ROXICODONE) 5 MG immediate release tablet Take 1 tablet (5 mg total) by mouth every 6 (six) hours as needed. Patient not taking: Reported on 05/17/2023 12/15/22   Pappayliou, Santina Evans A, DO  pantoprazole (PROTONIX) 40 MG tablet Take 40 mg by mouth daily. 02/07/20   [provider]  potassium chloride (MICRO-K) 10 MEQ CR capsule Take 10 mEq by mouth daily.    [provider]  Povidone, PF, (IVIZIA DRY EYES) 0.5 % SOLN Place 1 drop into both eyes daily as needed (dry eyes).    [provider]  prochlorperazine (COMPAZINE) 10 MG tablet Take 1 tablet (10 mg total) by mouth every 6 (six) hours as  needed for nausea or vomiting. Patient not taking: Reported on 04/14/2023 01/05/23   Doreatha Massed, MD  sodium chloride (OCEAN) 0.65 % SOLN nasal spray Place 1 spray into both nostrils as needed for congestion. 01/19/23   Carnella Guadal[place], PA-C  spironolactone (ALDACTONE) 25 MG tablet Take 0.5 tablets (12.5 mg total) by mouth daily. 12/18/22 12/07/24  Furth, Cadence H, PA-C      Allergies    Patient has no known allergies.    Review of Systems   Review of Systems  Constitutional:  Negative for appetite change, diaphoresis and fever.  Respiratory:  Positive for shortness of breath. Negative for cough.   Cardiovascular:  Negative for chest pain and leg swelling.  Gastrointestinal:  Negative for abdominal pain, diarrhea and vomiting.  Neurological:  Negative for dizziness, light-headedness and headaches.    Physical Exam Updated Vital Signs BP (!) 178/104 (BP Location: Right Arm)   Pulse 84   Temp 98.8 F (37.1 C)   Resp 18   SpO2 97%  Physical Exam Vitals and nursing note reviewed.  Constitutional:      Appearance: He is well-developed. He is not ill-appearing.  HENT:     Mouth/Throat:     Mouth: Mucous membranes are moist.  Cardiovascular:     Rate and Rhythm: Normal rate and regular rhythm.     Pulses: Normal pulses.  Pulmonary:     Effort: Pulmonary effort is normal.     Comments: At time of my exam, patient on 2 L O2 by nasal cannula.  No increased work of breathing.  Slightly diminished lung sounds bilaterally.  No rales or wheezing. Abdominal:     Palpations: Abdomen is soft.     Tenderness: There is no abdominal tenderness.  Musculoskeletal:        General: Normal range of motion.     Right lower leg: No edema.     Left lower leg: No edema.  Skin:    General: Skin is warm.     Capillary Refill: Capillary refill takes less than 2 seconds.  Neurological:     General: No focal deficit present.     Mental Status: He is alert.     Sensory: No sensory deficit.      Motor: No weakness.     ED Results / Procedures / Treatments   Labs (all labs ordered are listed, but only abnormal results are displayed) Labs Reviewed  CBC WITH DIFFERENTIAL/PLATELET - Abnormal; Notable for the following components:      Result Value   Hemoglobin 12.6 (*)    HCT 38.3 (*)    Monocytes Absolute 1.1 (*)    Abs Immature Granulocytes 0.11 (*)    All other components within normal limits  BRAIN NATRIURETIC PEPTIDE - Abnormal; Notable for the following components:   B  Natriuretic Peptide 326.0 (*)    All other components within normal limits  BASIC METABOLIC PANEL - Abnormal; Notable for the following components:   Glucose, Bld 251 (*)    Calcium 8.6 (*)    All other components within normal limits    EKG None  Radiology DG Chest Portable 1 View  Result Date: 06/06/2023 CLINICAL DATA:  Short of breath beginning this morning. EXAM: PORTABLE CHEST 1 VIEW COMPARISON:  05/17/2023.  CT, 12/07/2022. FINDINGS: Stable changes from prior CABG surgery. Cardiac silhouette borderline enlarged. No mediastinal or hilar masses. Lung volumes are low. Bilateral vascular congestion. Mild interstitial thickening. Additional opacity at the left lung base suspected to be atelectasis with a possible small effusion. No pneumothorax. Right internal jugular Port-A-Cath, tip in the upper superior vena cava, stable. Skeletal structures are grossly intact. IMPRESSION: 1. Borderline cardiac enlargement, vascular congestion mild interstitial thickening. Suspect mild congestive heart failure. Findings are similar to the chest radiograph dated 05/17/2023. Electronically Signed   By: Amie Portland M.D.   On: 06/06/2023 11:00    Procedures Procedures    Medications Ordered in ED Medications  furosemide (LASIX) injection 40 mg (40 mg Intravenous Given 06/06/23 1313)    ED Course/ Medical Decision Making/ A&P                             Medical Decision Making Patient here with history of  coronary artery disease, A-fib, CHF anticoagulated for A-fib.  Here with sudden onset shortness of breath this morning.  Was brought in by family member for hypoxia.  Found that his O2 tanks at home were empty and concentrator not working properly.  Granddaughter contacted someone to come out to the home to check the concentrator. Pt denies chest pain   Hypoxia earlier today likely from lack of supplemental oxygen.  He was placed on 4 L when he arrived here and oxygen quickly came up into the 90s.  He was returned to his baseline 2 L without further hypoxia states his breathing has returned to normal.  Will check labs here.  Anticipate discharge home if his concentrator or O2 tanks can be fixed or his O2 tanks filled.  Amount and/or Complexity of Data Reviewed Labs: ordered.    Details: Labs interpreted by me, no evidence of leukocytosis, chemistries show hyperglycemia without derangement.  BNP mildly elevated 326. Radiology: ordered.    Details: Chest x-ray shows borderline cardiac enlargement vascular congestion with mild interstitial thickening.  Findings similar to chest x-ray from June 2024 ECG/medicine tests: ordered.    Details: EKG shows atrial fibrillation, rate 81 Discussion of management or test interpretation with external provider(s): Granddaughter returned, oxygen concentrator fixed and O2 tanks have been filled.  Patient diuresed here.  Will have him continue his diuretic.  Hypertensive, no evidence of endorgan damage.  Agreeable to take his blood pressure medications when he returns home.  Will keep upcoming appointment for his cath procedure next week.  Return precautions were discussed  Risk Prescription drug management.           Final Clinical Impression(s) / ED Diagnoses Final diagnoses:  None    Rx / DC Orders ED Discharge Orders     None         Pauline Aus, PA-C 06/06/23 1442    Benjiman Core, MD 06/06/23 [phone]

## 2023-06-06 NOTE — ED Triage Notes (Signed)
Pt reports SHOB that started this morning. Pt reporting he is due to have a heart cath next week. Pt denies any chest pain at this time. PT wear 2L at baseline.

## 2023-06-15 ENCOUNTER — Encounter (HOSPITAL_COMMUNITY)
Admission: RE | Admit: 2023-06-15 | Discharge: 2023-06-15 | Disposition: A | Discharge status: Home / Self Care | Payer: Medicare Other | Source: Ambulatory Visit | Attending: Ophthalmology | Admitting: Ophthalmology

## 2023-06-16 ENCOUNTER — Other Ambulatory Visit: Payer: Self-pay

## 2023-06-16 ENCOUNTER — Encounter (HOSPITAL_COMMUNITY): Payer: Self-pay
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
## 2023-06-19 ENCOUNTER — Other Ambulatory Visit: Payer: Self-pay

## 2023-06-19 DIAGNOSIS — J310 Chronic rhinitis: Secondary | ICD-10-CM

## 2023-06-19 MED ORDER — FLUTICASONE PROPIONATE 50 MCG/ACT NA SUSP
1.0000 | Freq: Every day | NASAL | 2 refills | Status: DC
Start: 2023-06-19 — End: 2023-06-24

## 2023-06-24 ENCOUNTER — Other Ambulatory Visit: Payer: Self-pay | Admitting: *Deleted

## 2023-06-24 DIAGNOSIS — J310 Chronic rhinitis: Secondary | ICD-10-CM

## 2023-06-24 MED ORDER — FLUTICASONE PROPIONATE 50 MCG/ACT NA SUSP
1.0000 | Freq: Every day | NASAL | 2 refills | Status: AC
Start: 2023-06-24 — End: ?

## 2023-06-26 ENCOUNTER — Other Ambulatory Visit: Payer: Self-pay

## 2023-06-29 ENCOUNTER — Encounter (HOSPITAL_COMMUNITY): Payer: Self-pay

## 2023-06-29 ENCOUNTER — Emergency Department (HOSPITAL_COMMUNITY): Payer: Medicare Other

## 2023-06-29 ENCOUNTER — Other Ambulatory Visit: Payer: Self-pay

## 2023-06-29 ENCOUNTER — Observation Stay (HOSPITAL_COMMUNITY)
Admission: EM | Admit: 2023-06-29 | Discharge: 2023-06-30 | Disposition: A | Discharge status: Home / Self Care | Payer: Medicare Other | Attending: Internal Medicine | Admitting: Internal Medicine

## 2023-06-29 DIAGNOSIS — Z859 Personal history of malignant neoplasm, unspecified: Secondary | ICD-10-CM | POA: Insufficient documentation

## 2023-06-29 DIAGNOSIS — Z79899 Other long term (current) drug therapy: Secondary | ICD-10-CM | POA: Insufficient documentation

## 2023-06-29 DIAGNOSIS — I10 Essential (primary) hypertension: Secondary | ICD-10-CM | POA: Diagnosis present

## 2023-06-29 DIAGNOSIS — Z1152 Encounter for screening for COVID-19: Secondary | ICD-10-CM | POA: Diagnosis not present

## 2023-06-29 DIAGNOSIS — N183 Chronic kidney disease, stage 3 unspecified: Secondary | ICD-10-CM | POA: Insufficient documentation

## 2023-06-29 DIAGNOSIS — I350 Nonrheumatic aortic (valve) stenosis: Secondary | ICD-10-CM | POA: Diagnosis not present

## 2023-06-29 DIAGNOSIS — E1165 Type 2 diabetes mellitus with hyperglycemia: Secondary | ICD-10-CM | POA: Insufficient documentation

## 2023-06-29 DIAGNOSIS — E1122 Type 2 diabetes mellitus with diabetic chronic kidney disease: Secondary | ICD-10-CM | POA: Diagnosis not present

## 2023-06-29 DIAGNOSIS — Z7901 Long term (current) use of anticoagulants: Secondary | ICD-10-CM | POA: Insufficient documentation

## 2023-06-29 DIAGNOSIS — Z951 Presence of aortocoronary bypass graft: Secondary | ICD-10-CM | POA: Diagnosis not present

## 2023-06-29 DIAGNOSIS — I130 Hypertensive heart and chronic kidney disease with heart failure and stage 1 through stage 4 chronic kidney disease, or unspecified chronic kidney disease: Secondary | ICD-10-CM | POA: Insufficient documentation

## 2023-06-29 DIAGNOSIS — I5023 Acute on chronic systolic (congestive) heart failure: Principal | ICD-10-CM | POA: Insufficient documentation

## 2023-06-29 DIAGNOSIS — E785 Hyperlipidemia, unspecified: Secondary | ICD-10-CM | POA: Diagnosis present

## 2023-06-29 DIAGNOSIS — Z7984 Long term (current) use of oral hypoglycemic drugs: Secondary | ICD-10-CM | POA: Diagnosis not present

## 2023-06-29 DIAGNOSIS — I4891 Unspecified atrial fibrillation: Secondary | ICD-10-CM

## 2023-06-29 DIAGNOSIS — I5041 Acute combined systolic (congestive) and diastolic (congestive) heart failure: Secondary | ICD-10-CM | POA: Diagnosis not present

## 2023-06-29 DIAGNOSIS — I480 Paroxysmal atrial fibrillation: Secondary | ICD-10-CM | POA: Diagnosis not present

## 2023-06-29 DIAGNOSIS — Z7902 Long term (current) use of antithrombotics/antiplatelets: Secondary | ICD-10-CM | POA: Diagnosis not present

## 2023-06-29 DIAGNOSIS — R0602 Shortness of breath: Secondary | ICD-10-CM | POA: Diagnosis present

## 2023-06-29 DIAGNOSIS — J449 Chronic obstructive pulmonary disease, unspecified: Secondary | ICD-10-CM | POA: Diagnosis not present

## 2023-06-29 DIAGNOSIS — I2510 Atherosclerotic heart disease of native coronary artery without angina pectoris: Secondary | ICD-10-CM | POA: Diagnosis present

## 2023-06-29 DIAGNOSIS — E669 Obesity, unspecified: Secondary | ICD-10-CM | POA: Diagnosis present

## 2023-06-29 DIAGNOSIS — E119 Type 2 diabetes mellitus without complications: Secondary | ICD-10-CM

## 2023-06-29 DIAGNOSIS — E876 Hypokalemia: Secondary | ICD-10-CM | POA: Diagnosis not present

## 2023-06-29 DIAGNOSIS — I272 Pulmonary hypertension, unspecified: Secondary | ICD-10-CM

## 2023-06-29 DIAGNOSIS — K219 Gastro-esophageal reflux disease without esophagitis: Secondary | ICD-10-CM | POA: Diagnosis present

## 2023-06-29 DIAGNOSIS — I509 Heart failure, unspecified: Secondary | ICD-10-CM

## 2023-06-29 LAB — BASIC METABOLIC PANEL
Anion gap: 6 (ref 5–15)
BUN: 10 mg/dL (ref 8–23)
CO2: 25 mmol/L (ref 22–32)
Calcium: 8.4 mg/dL — ABNORMAL LOW (ref 8.9–10.3)
Chloride: 101 mmol/L (ref 98–111)
Creatinine, Ser: 0.94 mg/dL (ref 0.61–1.24)
GFR, Estimated: >60 mL/min (ref >60)
Glucose, Bld: 298 mg/dL — ABNORMAL HIGH (ref 70–99)
Potassium: 3.3 mmol/L — ABNORMAL LOW (ref 3.5–5.1)
Sodium: 132 mmol/L — ABNORMAL LOW (ref 135–145)

## 2023-06-29 LAB — CBC WITH DIFFERENTIAL/PLATELET
Abs Immature Granulocytes: 0.11 10*3/uL — ABNORMAL HIGH (ref 0.00–0.07)
Basophils Absolute: 0 10*3/uL (ref 0.0–0.1)
Basophils Relative: 0 %
Eosinophils Absolute: 0 10*3/uL (ref 0.0–0.5)
Eosinophils Relative: 0 %
HCT: 37.3 % — ABNORMAL LOW (ref 39.0–52.0)
Hemoglobin: 12.2 g/dL — ABNORMAL LOW (ref 13.0–17.0)
Immature Granulocytes: 1 %
Lymphocytes Relative: 12 %
Lymphs Abs: 1.2 10*3/uL (ref 0.7–4.0)
MCH: 29.3 pg (ref 26.0–34.0)
MCHC: 32.7 g/dL (ref 30.0–36.0)
MCV: 89.7 fL (ref 80.0–100.0)
Monocytes Absolute: 1 10*3/uL (ref 0.1–1.0)
Monocytes Relative: 11 %
Neutro Abs: 7.1 10*3/uL (ref 1.7–7.7)
Neutrophils Relative %: 76 %
Platelets: 244 10*3/uL (ref 150–400)
RBC: 4.16 MIL/uL — ABNORMAL LOW (ref 4.22–5.81)
RDW: 14.3 % (ref 11.5–15.5)
WBC: 9.4 10*3/uL (ref 4.0–10.5)
nRBC: 0 % (ref 0.0–0.2)

## 2023-06-29 LAB — GLUCOSE, CAPILLARY
Glucose-Capillary: 159 mg/dL — ABNORMAL HIGH (ref 70–99)
Glucose-Capillary: 267 mg/dL — ABNORMAL HIGH (ref 70–99)

## 2023-06-29 LAB — MAGNESIUM: Magnesium: 1.8 mg/dL (ref 1.7–2.4)

## 2023-06-29 LAB — TROPONIN I (HIGH SENSITIVITY)
Troponin I (High Sensitivity): 16 ng/L (ref <18)
Troponin I (High Sensitivity): 15 ng/L (ref <18)

## 2023-06-29 LAB — BRAIN NATRIURETIC PEPTIDE: B Natriuretic Peptide: 337 pg/mL — ABNORMAL HIGH (ref 0.0–100.0)

## 2023-06-29 LAB — SARS CORONAVIRUS 2 BY RT PCR: SARS Coronavirus 2 by RT PCR: NEGATIVE

## 2023-06-29 MED ORDER — PANTOPRAZOLE SODIUM 40 MG PO TBEC
40.0000 mg | DELAYED_RELEASE_TABLET | Freq: Every day | ORAL | Status: DC
  Administered 2023-06-30: 40 mg via ORAL
  Filled 2023-06-29: qty 1

## 2023-06-29 MED ORDER — INSULIN ASPART 100 UNIT/ML IJ SOLN
0.0000 [IU] | Freq: Three times a day (TID) | INTRAMUSCULAR | Status: DC
  Administered 2023-06-29 – 2023-06-30 (×2): 3 [IU] via SUBCUTANEOUS

## 2023-06-29 MED ORDER — CYCLOSPORINE 0.05 % OP EMUL
1.0000 [drp] | Freq: Two times a day (BID) | OPHTHALMIC | Status: DC
  Administered 2023-06-29 – 2023-06-30 (×3): 1 [drp] via OPHTHALMIC
  Filled 2023-06-29 (×3): qty 30

## 2023-06-29 MED ORDER — POTASSIUM CHLORIDE CRYS ER 20 MEQ PO TBCR: 20.0000 meq | EXTENDED_RELEASE_TABLET | Freq: Two times a day (BID) | ORAL | Status: DC

## 2023-06-29 MED ORDER — PANTOPRAZOLE SODIUM 40 MG PO TBEC: 40.0000 mg | DELAYED_RELEASE_TABLET | Freq: Every day | ORAL | Status: DC

## 2023-06-29 MED ORDER — ALBUTEROL SULFATE (2.5 MG/3ML) 0.083% IN NEBU: 2.5000 mg | INHALATION_SOLUTION | Freq: Four times a day (QID) | RESPIRATORY_TRACT | Status: DC | PRN

## 2023-06-29 MED ORDER — SODIUM CHLORIDE 0.9% FLUSH: 3.0000 mL | INTRAVENOUS | Status: DC | PRN

## 2023-06-29 MED ORDER — ACETAMINOPHEN 325 MG PO TABS: 650.0000 mg | ORAL_TABLET | Freq: Four times a day (QID) | ORAL | Status: DC | PRN

## 2023-06-29 MED ORDER — POTASSIUM CHLORIDE CRYS ER 20 MEQ PO TBCR
20.0000 meq | EXTENDED_RELEASE_TABLET | Freq: Two times a day (BID) | ORAL | Status: DC
  Administered 2023-06-29: 20 meq via ORAL
  Filled 2023-06-29: qty 1

## 2023-06-29 MED ORDER — APIXABAN 5 MG PO TABS
5.0000 mg | ORAL_TABLET | Freq: Two times a day (BID) | ORAL | Status: DC
  Administered 2023-06-29 – 2023-06-30 (×2): 5 mg via ORAL
  Filled 2023-06-29 (×2): qty 1

## 2023-06-29 MED ORDER — FLUTICASONE PROPIONATE 50 MCG/ACT NA SUSP
1.0000 | Freq: Every day | NASAL | Status: DC
  Administered 2023-06-29 – 2023-06-30 (×2): 1 via NASAL
  Filled 2023-06-29: qty 16

## 2023-06-29 MED ORDER — APIXABAN 5 MG PO TABS: 5.0000 mg | ORAL_TABLET | Freq: Two times a day (BID) | ORAL | Status: DC

## 2023-06-29 MED ORDER — LORATADINE 10 MG PO TABS
10.0000 mg | ORAL_TABLET | Freq: Every day | ORAL | Status: DC
  Administered 2023-06-30: 10 mg via ORAL
  Filled 2023-06-29: qty 1

## 2023-06-29 MED ORDER — ATORVASTATIN CALCIUM 40 MG PO TABS
40.0000 mg | ORAL_TABLET | Freq: Every day | ORAL | Status: DC
  Administered 2023-06-29: 40 mg via ORAL
  Filled 2023-06-29: qty 1

## 2023-06-29 MED ORDER — ALLOPURINOL 100 MG PO TABS
300.0000 mg | ORAL_TABLET | Freq: Every day | ORAL | Status: DC
  Administered 2023-06-30: 300 mg via ORAL
  Filled 2023-06-29: qty 3

## 2023-06-29 MED ORDER — SODIUM CHLORIDE 0.9% FLUSH
3.0000 mL | Freq: Two times a day (BID) | INTRAVENOUS | Status: DC
  Administered 2023-06-29 – 2023-06-30 (×3): 3 mL via INTRAVENOUS

## 2023-06-29 MED ORDER — FUROSEMIDE 10 MG/ML IJ SOLN
80.0000 mg | Freq: Once | INTRAMUSCULAR | Status: AC
  Administered 2023-06-29: 80 mg via INTRAVENOUS
  Filled 2023-06-29: qty 8

## 2023-06-29 MED ORDER — SODIUM CHLORIDE 0.9 % IV SOLN: 250.0000 mL | INTRAVENOUS | Status: DC | PRN

## 2023-06-29 MED ORDER — POTASSIUM CHLORIDE CRYS ER 20 MEQ PO TBCR
40.0000 meq | EXTENDED_RELEASE_TABLET | Freq: Once | ORAL | Status: AC
  Administered 2023-06-29: 40 meq via ORAL
  Filled 2023-06-29: qty 2

## 2023-06-29 MED ORDER — DOCUSATE SODIUM 100 MG PO CAPS: 300.0000 mg | ORAL_CAPSULE | Freq: Every day | ORAL | Status: DC | PRN

## 2023-06-29 MED ORDER — ONDANSETRON HCL 4 MG/2ML IJ SOLN: 4.0000 mg | Freq: Four times a day (QID) | INTRAMUSCULAR | Status: DC | PRN

## 2023-06-29 MED ORDER — BISACODYL 5 MG PO TBEC: 5.0000 mg | DELAYED_RELEASE_TABLET | Freq: Every day | ORAL | Status: DC | PRN

## 2023-06-29 MED ORDER — ACETAMINOPHEN 325 MG PO TABS: 650.0000 mg | ORAL_TABLET | ORAL | Status: DC | PRN

## 2023-06-29 MED ORDER — INSULIN ASPART 100 UNIT/ML IJ SOLN
0.0000 [IU] | Freq: Every day | INTRAMUSCULAR | Status: DC
  Administered 2023-06-29: 3 [IU] via SUBCUTANEOUS

## 2023-06-29 MED ORDER — MAGNESIUM SULFATE 2 GM/50ML IV SOLN
2.0000 g | Freq: Once | INTRAVENOUS | Status: AC
  Administered 2023-06-29: 2 g via INTRAVENOUS
  Filled 2023-06-29: qty 50

## 2023-06-29 NOTE — ED Provider Notes (Signed)
[place] [facility] AT [facility] Provider Note   CSN: [id number] Arrival date & time: 06/29/23  4098     History  Chief Complaint  Patient presents with   Shortness of Breath    Alejandro [name] is a 82 y.o. male.  He has a history of coronary disease CABG congestive heart failure aortic stenosis, is recently been on oxygen.  He said he has been more short of breath for a few weeks and acutely worse today.  No cough chest pain fevers chills nausea vomiting.  He is not sure why he is more short of breath.  He tells me he recently had a cardiac catheterization.  On review of his notes he is following with [facility] cardiology, had a cardiac cath and they are concerned about his aortic stenosis and may need a valve replacement.  He is on anticoagulation.  The history is provided by the patient.  Shortness of Breath Severity:  Severe Onset quality:  Gradual Duration:  2 weeks Timing:  Constant Progression:  Worsening Chronicity:  New Relieved by:  Nothing Worsened by:  Activity Ineffective treatments:  Rest and oxygen Associated symptoms: no abdominal pain, no chest pain, no cough, no diaphoresis, no fever, no hemoptysis, no sputum production, no syncope and no vomiting   Risk factors: no tobacco use        Home Medications Prior to Admission medications   Medication Sig Start Date End Date Taking? Authorizing Provider  acetaminophen (TYLENOL) 500 MG tablet Take 1,000 mg by mouth every 6 (six) hours as needed for moderate pain or headache.    [provider]  albuterol (ACCUNEB) 1.25 MG/3ML nebulizer solution Take 1 ampule by nebulization every 6 (six) hours as needed for wheezing or shortness of breath. 05/15/23   [provider]  albuterol (VENTOLIN HFA) 108 (90 Base) MCG/ACT inhaler Inhale 2 puffs into the lungs every 6 (six) hours as needed for wheezing or shortness of breath. 09/22/22   [provider]  allopurinol (ZYLOPRIM) 300  MG tablet Take 1 tablet (300 mg total) by mouth daily. 01/05/23   Doreatha Massed, MD  apixaban (ELIQUIS) 5 MG TABS tablet Take 1 tablet (5 mg total) by mouth 2 (two) times daily. 01/08/23   Pappayliou, Santina Evans A, DO  atorvastatin (LIPITOR) 40 MG tablet Take 40 mg by mouth at bedtime.    [provider]  bisacodyl (DULCOLAX) 5 MG EC tablet Take 5 mg by mouth daily as needed for moderate constipation. Patient not taking: Reported on 05/17/2023    [provider]  cyanocobalamin (VITAMIN B12) 1000 MCG tablet Take 1,000 mcg by mouth daily.    [provider]  docusate sodium (COLACE) 100 MG capsule Take 300 mg by mouth daily as needed for mild constipation.    [provider]  ferrous sulfate 325 (65 FE) MG EC tablet Take 1 tablet (325 mg total) by mouth daily with breakfast. 02/04/23   Rojelio Brenner M, PA-C  fluticasone (FLONASE) 50 MCG/ACT nasal spray Place 1 spray into both nostrils daily. 06/24/23   Carnella Guadalajara, PA-C  furosemide (LASIX) 80 MG tablet Take 1 tablet (80 mg total) by mouth daily. 02/17/23 05/18/23  [place], Eric J, DO  glipiZIDE (GLUCOTROL XL) 10 MG 24 hr tablet Take 10 mg by mouth daily.    [provider]  loratadine (CLARITIN) 10 MG tablet Take 10 mg by mouth daily. 04/25/22   [provider]  Melatonin 10 MG CAPS Take 30  mg by mouth at bedtime as needed (sleep).    [provider]  metFORMIN (GLUCOPHAGE-XR) 750 MG 24 hr tablet Take 1 tablet by mouth 2 (two) times daily with a meal. 01/05/23   [provider]  oxyCODONE (ROXICODONE) 5 MG immediate release tablet Take 1 tablet (5 mg total) by mouth every 6 (six) hours as needed. Patient not taking: Reported on 05/17/2023 12/15/22   Pappayliou, Santina Evans A, DO  pantoprazole (PROTONIX) 40 MG tablet Take 40 mg by mouth daily. 02/07/20   [provider]  potassium chloride (MICRO-K) 10 MEQ CR capsule Take 10 mEq by mouth daily.    [provider]  Povidone, PF, (IVIZIA DRY EYES) 0.5 % SOLN Place 1 drop into both eyes daily as needed (dry eyes).    [provider]  prochlorperazine (COMPAZINE) 10 MG tablet Take 1 tablet (10 mg total) by mouth every 6 (six) hours as needed for nausea or vomiting. Patient not taking: Reported on 04/14/2023 01/05/23   Doreatha Massed, MD  sodium chloride (OCEAN) 0.65 % SOLN nasal spray Place 1 spray into both nostrils as needed for congestion. 01/19/23   Carnella Guadal[place], PA-C  spironolactone (ALDACTONE) 25 MG tablet Take 0.5 tablets (12.5 mg total) by mouth daily. 12/18/22 12/07/24  Furth, Cadence H, PA-C      Allergies    Patient has no known allergies.    Review of Systems   Review of Systems  Constitutional:  Negative for diaphoresis and fever.  Respiratory:  Positive for shortness of breath. Negative for cough, hemoptysis and sputum production.   Cardiovascular:  Negative for chest pain and syncope.  Gastrointestinal:  Negative for abdominal pain and vomiting.    Physical Exam Updated Vital Signs Pulse 87   Ht 5\' 11"  (1.803 m)   Wt 105.2 kg   SpO2 99%   BMI 32.36 kg/m  Physical Exam Vitals and nursing note reviewed.  Constitutional:      General: He is not in acute distress.    Appearance: Normal appearance. He is well-developed.  HENT:     Head: Normocephalic and atraumatic.  Eyes:     Conjunctiva/sclera: Conjunctivae normal.  Cardiovascular:     Rate and Rhythm: Normal rate and regular rhythm.     Heart sounds: Murmur heard.  Pulmonary:     Effort: Tachypnea and accessory muscle usage present. No respiratory distress.     Breath sounds: Normal breath sounds.  Abdominal:     Palpations: Abdomen is soft.     Tenderness: There is no abdominal tenderness. There is no guarding or rebound.  Musculoskeletal:        General: No swelling.     Cervical back: Neck supple.     Right lower leg: No tenderness. Edema present.     Left lower leg: No tenderness. Edema  present.  Skin:    General: Skin is warm and dry.     Capillary Refill: Capillary refill takes less than 2 seconds.  Neurological:     General: No focal deficit present.     Mental Status: He is alert.     Motor: No weakness.     ED Results / Procedures / Treatments   Labs (all labs ordered are listed, but only abnormal results are displayed) Labs Reviewed  BASIC METABOLIC PANEL - Abnormal; Notable for the following components:      Result Value   Sodium 132 (*)    Potassium 3.3 (*)    Glucose, Bld 298 (*)  Calcium 8.4 (*)    All other components within normal limits  BRAIN NATRIURETIC PEPTIDE - Abnormal; Notable for the following components:   B Natriuretic Peptide 337.0 (*)    All other components within normal limits  CBC WITH DIFFERENTIAL/PLATELET - Abnormal; Notable for the following components:   RBC 4.16 (*)    Hemoglobin 12.2 (*)    HCT 37.3 (*)    Abs Immature Granulocytes 0.11 (*)    All other components within normal limits  GLUCOSE, CAPILLARY - Abnormal; Notable for the following components:   Glucose-Capillary 159 (*)    All other components within normal limits  SARS CORONAVIRUS 2 BY RT PCR  MAGNESIUM  BASIC METABOLIC PANEL  MAGNESIUM  TROPONIN I (HIGH SENSITIVITY)  TROPONIN I (HIGH SENSITIVITY)    EKG EKG Interpretation Date/Time:  Monday June 29 2023 10:11:21 EDT Ventricular Rate:  80 PR Interval:    QRS Duration:  115 QT Interval:  392 QTC Calculation: 453 R Axis:   63  Text Interpretation: Atrial fibrillation Nonspecific intraventricular conduction delay Borderline repolarization abnormality No significant change since prior 7/24 Confirmed by Meridee Score [phone]) on 06/29/2023 10:16:58 AM  Radiology DG Chest Port 1 View  Result Date: 06/29/2023 CLINICAL DATA:  Shortness of breath EXAM: PORTABLE CHEST - 1 VIEW COMPARISON:  06/06/2023 FINDINGS: Unchanged cardiomegaly and pulmonary vascular congestion. Unchanged left basilar airspace  opacity. Postsurgical changes of CABG again seen. Right chest port unchanged in position. IMPRESSION: Unchanged cardiomegaly and pulmonary vascular congestion indicative of heart failure/fluid volume overload. Unchanged left basilar opacity, likely due to atelectasis. Electronically Signed   By: Acquanetta Belling M.D.   On: 06/29/2023 11:28    Procedures Procedures    Medications Ordered in ED Medications  allopurinol (ZYLOPRIM) tablet 300 mg (has no administration in time range)  atorvastatin (LIPITOR) tablet 40 mg (has no administration in time range)  docusate sodium (COLACE) capsule 300 mg (has no administration in time range)  bisacodyl (DULCOLAX) EC tablet 5 mg (has no administration in time range)  loratadine (CLARITIN) tablet 10 mg (has no administration in time range)  albuterol (PROVENTIL) (2.5 MG/3ML) 0.083% nebulizer solution 2.5 mg (has no administration in time range)  fluticasone (FLONASE) 50 MCG/ACT nasal spray 1 spray (1 spray Each Nare Given 06/29/23 1513)  cycloSPORINE (RESTASIS) 0.05 % ophthalmic emulsion 1 drop (1 drop Both Eyes Given 06/29/23 1513)  sodium chloride flush (NS) 0.9 % injection 3 mL (3 mLs Intravenous Given 06/29/23 1435)  sodium chloride flush (NS) 0.9 % injection 3 mL (has no administration in time range)  0.9 %  sodium chloride infusion (has no administration in time range)  acetaminophen (TYLENOL) tablet 650 mg (has no administration in time range)  ondansetron (ZOFRAN) injection 4 mg (has no administration in time range)  insulin aspart (novoLOG) injection 0-15 Units (has no administration in time range)  insulin aspart (novoLOG) injection 0-5 Units (has no administration in time range)  apixaban (ELIQUIS) tablet 5 mg (has no administration in time range)  pantoprazole (PROTONIX) EC tablet 40 mg (has no administration in time range)  potassium chloride SA (KLOR-CON M) CR tablet 20 mEq (has no administration in time range)  magnesium sulfate IVPB 2 g 50 mL (0  g Intravenous Stopped 06/29/23 1256)  potassium chloride SA (KLOR-CON M) CR tablet 40 mEq (40 mEq Oral Given 06/29/23 1131)  furosemide (LASIX) injection 80 mg (80 mg Intravenous Given 06/29/23 1259)    ED Course/ Medical Decision Making/ A&P Clinical Course as of  06/29/23 1715  Mon Jun 29, 2023  1019 EKG not crossing in epic.  Atrial fibrillation with controlled rate, frequent PVCs, nonspecific ST-T's.  No significant change from prior EKG. [MB]  1106 Cardiac cath 7/24 - Impression  Prox RCA lesion is 100% stenosed.   Prox LAD lesion is 100% stenosed.   1st Mrg lesion is 100% stenosed.   Origin to Prox Graft lesion is 100% stenosed.   Mid Cx to Dist Cx lesion is 20% stenosed.   RPDA lesion is 50% stenosed.  The imaging is on file and stored in a permanent location.   DIAGNOSTIC CARDIAC CATHETERIZATION REPORT ___________________________________________________________________________ _ Findings: - Multivessel coronary disease including RCA CTO, chronic LAD CTO, obtuse marginal CTO, nonobstructive disease otherwise -Patent saphenous vein graft to right coronary artery, patent vein graft to first diagonal, occluded vein graft presumably to obtuse marginal, nonobstructive circumflex artery disease, patent LIMA to LAD -No target for PCI -Severe pulmonary hypertension -22 to 25 mm aortic valve gradient -Aortic valve area calculates to approximate 1.2 cm  Recommendations: -Will receive opinion about need for TAVR; echocardiogram was reviewed and it does not appear likely that this aortic stenosis is critical -Dyspnea is likely multifactorial due to pulmonary hypertension, left ventricular dysfunction and mild to moderate aortic and mitral valve disease  [MB]  1107 I asked the patient if he wanted me to involve his cardiology team from [facility] versus talk with the doctors here and he said he did not want to go as far as [place]. [MB]  1117 Echo 6/24 - Impression  Left  Ventricle: Systolic function is moderate to severely abnormal. EF: 35-40%. Quantitative analysis of left ventricular Global Longitudinal Strain (GLS) imaging is -6.100%.   Left Ventricle: Left ventricle size is normal.   Left Atrium: Left atrium is moderately dilated at 5.900 cm.   Right Ventricle: Systolic function is mildly reduced. Abnormal tricuspid annular plane systolic excursion (TAPSE) <1.7 cm.   Aortic Valve: There is moderate to severe stenosis, with peak and mean gradients of 66.000 and 37.000 mmHg, valve area of 1 sq cm   Mitral Valve: There is mild to moderate regurgitation with a centrally directed jet.   Tricuspid Valve: The right ventricular systolic pressure is mildly elevated (37-49 mmHg).  [MB]  1149 Discussed with Dr. Wyline Mood cardiology who said he would evaluate the patient in consult. [MB]  1251 Patient was evaluated Dr. Wyline Mood and he feels he would benefit from mission to the hospital for IV diuresis. [MB]    Clinical Course User Index [MB] Terrilee Files, MD                             Medical Decision Making Amount and/or Complexity of Data Reviewed Labs: ordered. Radiology: ordered.  Risk Prescription drug management. Decision regarding hospitalization.   This patient complains of increased shortness of breath dyspnea on exertion; this involves an extensive number of treatment Options and is a complaint that carries with it a high risk of complications and morbidity. The differential includes CHF, valvular heart disease, COPD, hypoxia, pneumonia sepsis, Sirs, anemia  I ordered, reviewed and interpreted labs, which included CBC with normal white count stable low hemoglobin, chemistries with mild hypokalemia elevated glucose, low magnesium, COVID-negative, BNP mildly elevated I ordered medication IV potassium and magnesium, IV Lasix and reviewed PMP when indicated. I ordered imaging studies which included chest x-ray and I independently     visualized and interpreted imaging which  showed stable cardiomegaly Additional history obtained from patient's family member Previous records obtained and reviewed in epic including recent [facility] notes from cardiology I consulted cardiology Dr. Wyline Mood and [facility] hospitalist Dr. Sherryll Burger and discussed lab and imaging findings and discussed disposition.  Cardiac monitoring reviewed, A-fib with frequent PVCs  Social determinants considered, patient with increased stressors Critical Interventions: None  After the interventions stated above, I reevaluated the patient and found patient still to be symptomatic dyspneic although not hypoxic on stable oxygen Admission and further testing considered, patient will benefit from mission the hospital for IV diuresis.  Patient in agreement with plan for admission.         Final Clinical Impression(s) / ED Diagnoses Final diagnoses:  Acute on chronic congestive heart failure, unspecified heart failure type (HCC)  Hypokalemia  Hypomagnesemia  Atrial fibrillation with controlled ventricular rate [facility])    Rx / DC Orders ED Discharge Orders     None         Terrilee Files, MD 06/29/23 1719

## 2023-06-29 NOTE — Progress Notes (Signed)
Patient alert and verbal, ambulated with assistance to bathroom. Patient verbalized no complaints of pain during shift. Continues on 3 liters of oxygen, patient stated normally on 2 liters at home. Reported some complaints of shortness of breath while ambulating.

## 2023-06-29 NOTE — H&P (Addendum)
History and Physical    Alejandro [name] MVH:[id number] DOB: [birth year] DOA: 06/29/2023  PCP: Roe Rutherford, NP   Patient coming from: Home  Chief Complaint: Shortness of breath  HPI: Alejandro [name] is a 82 y.o. male with medical history significant for CAD with prior CABG, moderate aortic stenosis, PAF on Eliquis, HFrEF, chronic hypoxemia, bradycardia, gout, and GERD who presented to the ED with significant shortness of breath that began suddenly while he was sitting on his couch.  He denies any pain in his chest, cough, fevers, or chills.  He has some mild lower extremity edema with right side being greater than left.  He states that his weight has been stable and he has been compliant with his home medications including diuretic.  He recently was noted to have a cardiac catheterization at [facility] as well as echo which showed EF 35-40% on 05/2023.   ED Course: Vital signs are stable and patient afebrile.  Sodium 132 and potassium 3.3 with glucose 298 and BNP 337.  Chest x-ray with some cardiomegaly and pulmonary vascular congestion noted.  He has been seen by cardiology already with recommendations to start IV diuresis and electrolyte supplementation ordered by EDP.  Review of Systems: Reviewed as noted above, otherwise negative.  Past Medical History:  Diagnosis Date   A-fib [facility])    AAA Ultrasound 01/11/2020    AAA [place] 01/11/20:  no abdominal aortic aneurysm, 2.4 cm   Aortic insufficiency    a. mild-mod by intraop TEE 05/2018 (no aortic stenosis) // Echo 2/21: mild //  Echo 9/22: trivial   Aortic stenosis    Echo 9/22: mild (mean 16) // Echo 2/21: mild (mean 18.7)   CAD (coronary artery disease)    a. NSTEMI 05/2018 with multivessel disease -> s/p CABGx4 05/11/18 // Myoview 06/07/20: inf scar w mild peri-infarct ischemia, EF 37; Int Risk   Cancer (HCC)    Carotid artery disease (HCC)    PreCABG [place] 05/07/18: Bilat ICA 1-39   CKD (chronic kidney disease), stage III (HCC)    HFimpEF (heart  failure with improved EF)    Echocardiogram 01/11/20: EF 45, Gr 3 DD, mild AI, mild AS (mean 18.7 mmHg) // Echocardiogram 08/14/21:  EF 55-60, inf HK, mild LVH, Gr 1 DD, normal RVSF, RVSP 25.3, mild LAE, trivial MR, MAC, trivial AI, mild AS (mean 16 mmHg, Vmax 283 cm/s, DI 0.41)   History of nephrolithiasis 2004   Hyperlipidemia    Hypertension    IBS (irritable colon syndrome)    Ischemic cardiomyopathy    Mitral regurgitation    Echo 9/22: trivial MR   Postoperative atrial fibrillation (HCC)    PVC's (premature ventricular contractions)    Reflux    Type 2 diabetes mellitus (HCC)     Past Surgical History:  Procedure Laterality Date   BRONCHIAL NEEDLE ASPIRATION BIOPSY  11/17/2022   Procedure: BRONCHIAL NEEDLE ASPIRATION BIOPSIES;  Surgeon: Leslye Peer, MD;  Location: [facility] ENDOSCOPY;  Service: Cardiopulmonary;;   CARDIOVERSION     CORONARY ARTERY BYPASS GRAFT N/A 05/11/2018   Procedure: CORONARY ARTERY BYPASS GRAFTING (CABG), on pump, times four, using left internal mammary artery and endoscopically harvested right greater saphenous leg vein.;  Surgeon: Kerin Perna, MD;  Location: [facility] OR;  Service: Open Heart Surgery;  Laterality: N/A;   ESOPHAGOGASTRODUODENOSCOPY  02/04/2012   Procedure: ESOPHAGOGASTRODUODENOSCOPY (EGD);  Surgeon: Malissa Hippo, MD;  Location: [facility] ENDO SUITE;  Service: Endoscopy;  Laterality: N/A;  100   INGUINAL  LYMPH NODE BIOPSY Left 12/15/2022   Procedure: INGUINAL LYMPH NODE BIOPSY;  Surgeon: Lewie Chamber, DO;  Location: [facility] ORS;  Service: General;  Laterality: Left;   LEFT HEART CATH AND CORONARY ANGIOGRAPHY N/A 05/04/2018   Procedure: LEFT HEART CATH AND CORONARY ANGIOGRAPHY;  Surgeon: Lyn Records, MD;  Location: [facility] INVASIVE CV LAB;  Service: Cardiovascular;  Laterality: N/A;   NASAL FRACTURE SURGERY     PORTACATH PLACEMENT Right 01/06/2023   Procedure: INSERTION PORT-A-CATH;  Surgeon: Lewie Chamber, DO;  Location: [facility] ORS;  Service:  General;  Laterality: Right;   TEE WITHOUT CARDIOVERSION N/A 05/11/2018   Procedure: TRANSESOPHAGEAL ECHOCARDIOGRAM (TEE);  Surgeon: Donata Clay, Theron Arista, MD;  Location: [facility] OR;  Service: Open Heart Surgery;  Laterality: N/A;   VIDEO BRONCHOSCOPY WITH ENDOBRONCHIAL ULTRASOUND Right 11/17/2022   Procedure: VIDEO BRONCHOSCOPY WITH ENDOBRONCHIAL ULTRASOUND;  Surgeon: Leslye Peer, MD;  Location: [facility] ENDOSCOPY;  Service: Cardiopulmonary;  Laterality: Right;     reports that he has never smoked. He has never used smokeless tobacco. He reports that he does not drink alcohol and does not use drugs.  No Known Allergies  Family History  Problem Relation Age of Onset   Diabetes Mellitus II Mother    Heart attack Father    Heart attack Brother    Heart disease Other    Arthritis Other    Asthma Other    Diabetes Other     Prior to Admission medications   Medication Sig Start Date End Date Taking? Authorizing Provider  acetaminophen (TYLENOL) 500 MG tablet Take 1,000 mg by mouth every 6 (six) hours as needed for moderate pain or headache.   Yes [provider]  albuterol (ACCUNEB) 1.25 MG/3ML nebulizer solution Take 1 ampule by nebulization every 6 (six) hours as needed for wheezing or shortness of breath. 05/15/23  Yes [provider]  albuterol (VENTOLIN HFA) 108 (90 Base) MCG/ACT inhaler Inhale 2 puffs into the lungs every 6 (six) hours as needed for wheezing or shortness of breath. 09/22/22  Yes [provider]  allopurinol (ZYLOPRIM) 300 MG tablet Take 1 tablet (300 mg total) by mouth daily. 01/05/23  Yes Doreatha Massed, MD  apixaban (ELIQUIS) 5 MG TABS tablet Take 1 tablet (5 mg total) by mouth 2 (two) times daily. 01/08/23  Yes Pappayliou, Santina Evans A, DO  atorvastatin (LIPITOR) 40 MG tablet Take 40 mg by mouth at bedtime.   Yes [provider]  bisacodyl (DULCOLAX) 5 MG EC tablet Take 5 mg by mouth daily as needed for moderate constipation.   Yes [provider]  cyanocobalamin (VITAMIN B12) 1000 MCG tablet Take 1,000 mcg by mouth daily.   Yes [provider]  cycloSPORINE (RESTASIS) 0.05 % ophthalmic emulsion Place 1 drop into both eyes 2 (two) times daily. 05/29/23  Yes [provider]  docusate sodium (COLACE) 100 MG capsule Take 300 mg by mouth daily as needed for mild constipation.   Yes [provider]  famotidine (PEPCID) 20 MG tablet Take 20 mg by mouth 2 (two) times daily. 06/19/23  Yes [provider]  ipratropium (ATROVENT) 0.06 % nasal spray Place into the nose. 05/29/23  Yes [provider]  azithromycin (ZITHROMAX) 250 MG tablet Take 250 mg by mouth as directed. Patient not taking: Reported on 06/29/2023 06/22/23   [provider]  fluticasone (FLONASE) 50 MCG/ACT nasal spray Place 1 spray into both nostrils daily. 06/24/23   Carnella Guadalajara, PA-C  furosemide (LASIX) 80  MG tablet Take 1 tablet (80 mg total) by mouth daily. 02/17/23 05/18/23  [place], Eric J, DO  glipiZIDE (GLUCOTROL XL) 10 MG 24 hr tablet Take 10 mg by mouth daily.    [provider]  loratadine (CLARITIN) 10 MG tablet Take 10 mg by mouth daily. 04/25/22   [provider]  Melatonin 10 MG CAPS Take 30 mg by mouth at bedtime as needed (sleep).    [provider]  metFORMIN (GLUCOPHAGE-XR) 750 MG 24 hr tablet Take 1 tablet by mouth 2 (two) times daily with a meal. 01/05/23   [provider]  oxyCODONE (ROXICODONE) 5 MG immediate release tablet Take 1 tablet (5 mg total) by mouth every 6 (six) hours as needed. Patient not taking: Reported on 05/17/2023 12/15/22   Pappayliou, Santina Evans A, DO  pantoprazole (PROTONIX) 40 MG tablet Take 40 mg by mouth daily. 02/07/20   [provider]  potassium chloride (MICRO-K) 10 MEQ CR capsule Take 10 mEq by mouth daily.    [provider]  Povidone, PF, (IVIZIA DRY EYES) 0.5 % SOLN Place 1 drop into both eyes daily as needed (dry  eyes).    [provider]  prochlorperazine (COMPAZINE) 10 MG tablet Take 1 tablet (10 mg total) by mouth every 6 (six) hours as needed for nausea or vomiting. Patient not taking: Reported on 04/14/2023 01/05/23   Doreatha Massed, MD  sodium chloride (OCEAN) 0.65 % SOLN nasal spray Place 1 spray into both nostrils as needed for congestion. 01/19/23   Carnella Gu[place], PA-C  spironolactone (ALDACTONE) 25 MG tablet Take 0.5 tablets (12.5 mg total) by mouth daily. 12/18/22 12/07/24  Marianne Sofia, PA-C    Physical Exam: Vitals:   06/29/23 1130 06/29/23 1200 06/29/23 1300 06/29/23 1337  BP: (!) 142/96 (!) 158/84 (!) 142/98   Pulse: 82 78 74   Resp: (!) 27 19 (!) 26   Temp:    97.9 F (36.6 C)  TempSrc:    Oral  SpO2: 97% 97% 97%   Weight:      Height:        Constitutional: NAD, calm, comfortable Vitals:   06/29/23 1130 06/29/23 1200 06/29/23 1300 06/29/23 1337  BP: (!) 142/96 (!) 158/84 (!) 142/98   Pulse: 82 78 74   Resp: (!) 27 19 (!) 26   Temp:    97.9 F (36.6 C)  TempSrc:    Oral  SpO2: 97% 97% 97%   Weight:      Height:       Eyes: lids and conjunctivae normal Neck: normal, supple Respiratory: clear to auscultation bilaterally. Normal respiratory effort. No accessory muscle use.  2 L nasal cannula oxygen Cardiovascular: Regular rate and rhythm, no murmurs. Abdomen: no tenderness, no distention. Bowel sounds positive.  Musculoskeletal: Right greater than left skin edema Skin: no rashes, lesions, ulcers.  Psychiatric: Flat affect  Labs on Admission: I have personally reviewed following labs and imaging studies  CBC: Recent Labs  Lab 06/29/23 1016  WBC 9.4  NEUTROABS 7.1  HGB 12.2*  HCT 37.3*  MCV 89.7  PLT 244   Basic Metabolic Panel: Recent Labs  Lab 06/29/23 1016  NA 132*  K 3.3*  CL 101  CO2 25  GLUCOSE 298*  BUN 10  CREATININE 0.94  CALCIUM 8.4*  MG 1.8   GFR: Estimated Creatinine Clearance: 77.4 mL/min (by C-G formula based  on SCr of 0.94 mg/dL). Liver Function Tests: No results for input(s): "AST", "ALT", "ALKPHOS", "BILITOT", "  PROT", "ALBUMIN" in the last 168 hours. No results for input(s): "LIPASE", "AMYLASE" in the last 168 hours. No results for input(s): "AMMONIA" in the last 168 hours. Coagulation Profile: No results for input(s): "INR", "PROTIME" in the last 168 hours. Cardiac Enzymes: No results for input(s): "CKTOTAL", "CKMB", "CKMBINDEX", "TROPONINI" in the last 168 hours. BNP (last 3 results) No results for input(s): "PROBNP" in the last 8760 hours. HbA1C: No results for input(s): "HGBA1C" in the last 72 hours. CBG: No results for input(s): "GLUCAP" in the last 168 hours. Lipid Profile: No results for input(s): "CHOL", "HDL", "LDLCALC", "TRIG", "CHOLHDL", "LDLDIRECT" in the last 72 hours. Thyroid Function Tests: No results for input(s): "TSH", "T4TOTAL", "FREET4", "T3FREE", "THYROIDAB" in the last 72 hours. Anemia Panel: No results for input(s): "VITAMINB12", "FOLATE", "FERRITIN", "TIBC", "IRON", "RETICCTPCT" in the last 72 hours. Urine analysis:    Component Value Date/Time   COLORURINE YELLOW 12/07/2022 1020   APPEARANCEUR CLEAR 12/07/2022 1020   LABSPEC 1.014 12/07/2022 1020   PHURINE 8.0 12/07/2022 1020   GLUCOSEU 150 (A) 12/07/2022 1020   HGBUR NEGATIVE 12/07/2022 1020   BILIRUBINUR NEGATIVE 12/07/2022 1020   KETONESUR NEGATIVE 12/07/2022 1020   PROTEINUR NEGATIVE 12/07/2022 1020   NITRITE NEGATIVE 12/07/2022 1020   LEUKOCYTESUR NEGATIVE 12/07/2022 1020    Radiological Exams on Admission: DG Chest Port 1 View  Result Date: 06/29/2023 CLINICAL DATA:  Shortness of breath EXAM: PORTABLE CHEST - 1 VIEW COMPARISON:  06/06/2023 FINDINGS: Unchanged cardiomegaly and pulmonary vascular congestion. Unchanged left basilar airspace opacity. Postsurgical changes of CABG again seen. Right chest port unchanged in position. IMPRESSION: Unchanged cardiomegaly and pulmonary vascular congestion  indicative of heart failure/fluid volume overload. Unchanged left basilar opacity, likely due to atelectasis. Electronically Signed   By: Acquanetta Belling M.D.   On: 06/29/2023 11:28    EKG: Independently reviewed.   Assessment/Plan Principal Problem:   Acute CHF (HCC) Active Problems:   Essential hypertension   CAD (coronary artery disease)   Diabetes mellitus (HCC)   Hyperlipidemia   GERD (gastroesophageal reflux disease)   Obesity   Aortic stenosis   S/P CABG x 4    Acute on chronic HFrEF 2D echocardiogram with LVEF 35-40% 05/2023 with noted moderate AAS Continue diuresis with IV Lasix per cardiology Hold beta-blocker due to prior bradycardia Continue Entresto  Strict I's and O's, daily weights, Reds clip  Mild hypokalemia Replete and reevaluate in a.m., monitor carefully while on diuresis  Hyponatremia In the setting of volume overload, continue to monitor  Type 2 diabetes with hyperglycemia Carb modified diet and SSI Hold home medications  Aortic stenosis Currently followed by [facility]  CAD with prior CABG Recent catheterization 06/2023 at [facility] with no PCI targets noted No acute issues currently, continue to monitor  Atrial fibrillation Continue anticoagulation with Eliquis Monitor on telemetry Off of AV nodal agents due to bradycardia  COPD with chronic hypoxemia 2 L chronically at home  NHL Per notes, currently in remission  GERD PPI  Gout Currently without acute flare, continue allopurinol  Obesity BMI 32.36   DVT prophylaxis: Eliquis Code Status: Full Family Communication: None at bedside Disposition Plan:Admit for diuresis Consults called:Cardiology Admission status: Inpatient, Tele  Severity of Illness: The appropriate patient status for this patient is INPATIENT. Inpatient status is judged to be reasonable and necessary in order to provide the required intensity of service to ensure the patient's safety. The patient's presenting symptoms,  physical exam findings, and initial radiographic and laboratory data in the context of their  chronic comorbidities is felt to place them at high risk for further clinical deterioration. Furthermore, it is not anticipated that the patient will be medically stable for discharge from the hospital within 2 midnights of admission.   * I certify that at the point of admission it is my clinical judgment that the patient will require inpatient hospital care spanning beyond 2 midnights from the point of admission due to high intensity of service, high risk for further deterioration and high frequency of surveillance required.*   Taniela Feltus D Latavion Halls DO [facility]  If 7PM-7AM, please contact night-coverage [email or web address]  06/29/2023, 1:42 PM

## 2023-06-29 NOTE — Progress Notes (Signed)
Tele called patient had a 5 beat run of [place]. Md Sherryll Burger made aware.

## 2023-06-29 NOTE — Consult Note (Signed)
Cardiology Consultation   Patient ID: Alejandro [name] MRN: [id number]; DOB: 1941/02/09  Admit date: 06/29/2023 Date of Consult: 06/29/2023  PCP:  Roe Rutherford, NP   [facility] Providers Cardiologist:  Nona Dell, MD        Patient Profile:   Alejandro [name] is a 82 y.o. male with a hx of CAD with prior CABG, chronic HFrEF,  who is being seen 06/29/2023 for the evaluation of SOB  at the request of Dr Charm Barges.  History of Present Illness:   [name] 82 yo male history of chronic HFrEF LVEF 35-40% by [facility] echo 05/2023, CAD with prior CABG in 2019, moderate aortic stenosis, PAF, bradycardia off av nodal agents,  who presents with SOB.  Reports feeling well up until this morning. While sitting on the couch onset of severe SOB. Denies any chest pains. No coughing or wheezing. Has had some mild LE edema, reports weights have been stable at 232 lbs and has been compliant with meds including his diuretic.    K 3.3 Cr 0.94  BNP 337  Mg 1.8 Trop 16 EKG rate controlled afib, no acute ischemic changes CXR chronc congestion suggesting fluid overload    06/2023 cath [facility] CTO of LAD, RCA, OM. Patent SVG- RCA, SVG-D1, occlued SVG-OM, patent LIMA-LAD. No PCI targets. AVA area by cath 1.2, measured gradient 22-25. PA Baseline: 58/28. PCWP Baseline: 28/34. RV Baseline: 64/5. RA Baseline: 14/17. LV Baseline: 185/22. AO Baseline: 164/85. Fick CO / CI Baseline: 6.36/2.83. PA Sat Baseline: 69.4. AO Sat Baseline: 93.5.   08/2022 echo: LVEF 50-55%, mod AS mean grad 22, AVA VTI 1.48  05/2023 echo [facility] Left Ventricle: Systolic function is moderate to severely abnormal. EF:  35-40%. Quantitative analysis of left ventricular Global Longitudinal  Strain (GLS) imaging is -6.100%.    Left Ventricle: Left ventricle size is normal.    Left Atrium: Left atrium is moderately dilated at 5.900 cm.    Right Ventricle: Systolic function is mildly reduced. Abnormal  tricuspid annular plane  systolic excursion (TAPSE) <1.7 cm.    Aortic Valve: There is moderate to severe stenosis, with peak and mean  gradients of 66.000 and 37.000 mmHg, valve area of 1 sq cm    Mitral Valve: There is mild to moderate regurgitation with a centrally  directed jet.    Tricuspid Valve: The right ventricular systolic pressure is mildly  elevated (37-49 mmHg).   Past Medical History:  Diagnosis Date   A-fib [facility])    AAA Ultrasound 01/11/2020    AAA [place] 01/11/20:  no abdominal aortic aneurysm, 2.4 cm   Aortic insufficiency    a. mild-mod by intraop TEE 05/2018 (no aortic stenosis) // Echo 2/21: mild //  Echo 9/22: trivial   Aortic stenosis    Echo 9/22: mild (mean 16) // Echo 2/21: mild (mean 18.7)   CAD (coronary artery disease)    a. NSTEMI 05/2018 with multivessel disease -> s/p CABGx4 05/11/18 // Myoview 06/07/20: inf scar w mild peri-infarct ischemia, EF 37; Int Risk   Cancer (HCC)    Carotid artery disease (HCC)    PreCABG [place] 05/07/18: Bilat ICA 1-39   CKD (chronic kidney disease), stage III (HCC)    HFimpEF (heart failure with improved EF)    Echocardiogram 01/11/20: EF 45, Gr 3 DD, mild AI, mild AS (mean 18.7 mmHg) // Echocardiogram 08/14/21:  EF 55-60, inf HK, mild LVH, Gr 1 DD, normal RVSF, RVSP 25.3, mild LAE, trivial MR, MAC, trivial AI,  mild AS (mean 16 mmHg, Vmax 283 cm/s, DI 0.41)   History of nephrolithiasis 2004   Hyperlipidemia    Hypertension    IBS (irritable colon syndrome)    Ischemic cardiomyopathy    Mitral regurgitation    Echo 9/22: trivial MR   Postoperative atrial fibrillation (HCC)    PVC's (premature ventricular contractions)    Reflux    Type 2 diabetes mellitus (HCC)     Past Surgical History:  Procedure Laterality Date   BRONCHIAL NEEDLE ASPIRATION BIOPSY  11/17/2022   Procedure: BRONCHIAL NEEDLE ASPIRATION BIOPSIES;  Surgeon: Leslye Peer, MD;  Location: [facility] ENDOSCOPY;  Service: Cardiopulmonary;;   CARDIOVERSION     CORONARY ARTERY BYPASS GRAFT N/A 05/11/2018    Procedure: CORONARY ARTERY BYPASS GRAFTING (CABG), on pump, times four, using left internal mammary artery and endoscopically harvested right greater saphenous leg vein.;  Surgeon: Kerin Perna, MD;  Location: [facility] OR;  Service: Open Heart Surgery;  Laterality: N/A;   ESOPHAGOGASTRODUODENOSCOPY  02/04/2012   Procedure: ESOPHAGOGASTRODUODENOSCOPY (EGD);  Surgeon: Malissa Hippo, MD;  Location: [facility] ENDO SUITE;  Service: Endoscopy;  Laterality: N/A;  100   INGUINAL LYMPH NODE BIOPSY Left 12/15/2022   Procedure: INGUINAL LYMPH NODE BIOPSY;  Surgeon: Lewie Chamber, DO;  Location: [facility] ORS;  Service: General;  Laterality: Left;   LEFT HEART CATH AND CORONARY ANGIOGRAPHY N/A 05/04/2018   Procedure: LEFT HEART CATH AND CORONARY ANGIOGRAPHY;  Surgeon: Lyn Records, MD;  Location: [facility] INVASIVE CV LAB;  Service: Cardiovascular;  Laterality: N/A;   NASAL FRACTURE SURGERY     PORTACATH PLACEMENT Right 01/06/2023   Procedure: INSERTION PORT-A-CATH;  Surgeon: Lewie Chamber, DO;  Location: [facility] ORS;  Service: General;  Laterality: Right;   TEE WITHOUT CARDIOVERSION N/A 05/11/2018   Procedure: TRANSESOPHAGEAL ECHOCARDIOGRAM (TEE);  Surgeon: Donata Clay, Theron Arista, MD;  Location: [facility] OR;  Service: Open Heart Surgery;  Laterality: N/A;   VIDEO BRONCHOSCOPY WITH ENDOBRONCHIAL ULTRASOUND Right 11/17/2022   Procedure: VIDEO BRONCHOSCOPY WITH ENDOBRONCHIAL ULTRASOUND;  Surgeon: Leslye Peer, MD;  Location: [facility] ENDOSCOPY;  Service: Cardiopulmonary;  Laterality: Right;     Inpatient Medications: Scheduled Meds:  Continuous Infusions:  magnesium sulfate bolus IVPB 2 g (06/29/23 1131)   PRN Meds:   Allergies:   No Known Allergies  Social History:   Social History   Socioeconomic History   Marital status: Widowed    Spouse name: Not on file   Number of children: Not on file   Years of education: Not on file   Highest education level: Not on file  Occupational History   Not on file  Tobacco  Use   Smoking status: Never   Smokeless tobacco: Never  Vaping Use   Vaping status: Never Used  Substance and Sexual Activity   Alcohol use: No    Alcohol/week: 0.0 standard drinks of alcohol   Drug use: No   Sexual activity: Yes  Other Topics Concern   Not on file  Social History Narrative   Not on file   Social Determinants of Health   Financial Resource Strain: Low Risk  (05/29/2023)   Received from [facility], [facility]   Overall Financial Resource Strain (CARDIA)    Difficulty of Paying Living Expenses: Not hard at all  Food Insecurity: No Food Insecurity (05/29/2023)   Received from [facility], [facility]   Hunger Vital Sign    Worried About Running Out of Food in the Last Year: Never true  Ran Out of Food in the Last Year: Never true  Transportation Needs: No Transportation Needs (05/29/2023)   Received from [facility], [facility]   [facility] - Transportation    Lack of Transportation (Medical): No    Lack of Transportation (Non-Medical): No  Physical Activity: Unknown (05/29/2023)   Received from [facility], [facility]   Exercise Vital Sign    Days of Exercise per Week: 0 days    Minutes of Exercise per Session: Not on file  Stress: Stress Concern Present (06/12/2023)   Received from [facility] - Occupational Stress Questionnaire    Feeling of Stress : To some extent  Social Connections: Socially Integrated (05/29/2023)   Received from [facility], [facility]   Social Network    How would you rate your social network (family, work, friends)?: Good participation with social networks  Intimate Partner Violence: Not At Risk (06/12/2023)   Received from [facility]   HITS    Over the last 12 months how often did your partner physically hurt you?: 1    Over the last 12 months how often did your partner insult you or talk down to you?: 1    Over the last 12 months how often did your partner  threaten you with physical harm?: 1    Over the last 12 months how often did your partner scream or curse at you?: 1    Family History:    Family History  Problem Relation Age of Onset   Diabetes Mellitus II Mother    Heart attack Father    Heart attack Brother    Heart disease Other    Arthritis Other    Asthma Other    Diabetes Other      ROS:  Please see the history of present illness.   All other ROS reviewed and negative.     Physical Exam/Data:   Vitals:   06/29/23 1017 06/29/23 1100 06/29/23 1115 06/29/23 1130  BP:  (!) 155/82 (!) 160/83 (!) 142/96  Pulse:  82 84 82  Resp:  (!) 23 (!) 33 (!) 27  Temp: 97.8 F (36.6 C)     TempSrc: Oral     SpO2:  95% 97% 97%  Weight:      Height:       No intake or output data in the 24 hours ending 06/29/23 1153    06/29/2023   10:06 AM 06/16/2023    3:01 PM 05/17/2023    1:07 PM  Last 3 Weights  Weight (lbs) 232 lb 230 lb 2.6 oz 230 lb 2.6 oz  Weight (kg) 105.235 kg 104.4 kg 104.4 kg     Body mass index is 32.36 kg/m.  General:  Well nourished, well developed, in no acute distress HEENT: normal Neck: +JVD Vascular: No carotid bruits; Distal pulses 2+ bilaterally Cardiac:  irreg, 3/6 systolic murmur rusb Lungs:  crackles bilatearlly Abd: soft, nontender, no hepatomegaly  Ext: no edema Musculoskeletal:  trace bilateral edema Skin: warm and dry  Neuro:  CNs 2-12 intact, no focal abnormalities noted Psych:  Normal affect    Laboratory Data:  High Sensitivity Troponin:   Recent Labs  Lab 06/29/23 1016  TROPONINIHS 16     Chemistry Recent Labs  Lab 06/29/23 1016  NA 132*  K 3.3*  CL 101  CO2 25  GLUCOSE 298*  BUN 10  CREATININE 0.94  CALCIUM 8.4*  MG 1.8  GFRNONAA >60  ANIONGAP  6    No results for input(s): "PROT", "ALBUMIN", "AST", "ALT", "ALKPHOS", "BILITOT" in the last 168 hours. Lipids No results for input(s): "CHOL", "TRIG", "HDL", "LABVLDL", "LDLCALC", "CHOLHDL" in the last 168 hours.   HematologyNo results for input(s): "WBC", "RBC", "HGB", "HCT", "MCV", "MCH", "MCHC", "RDW", "PLT" in the last 168 hours. Thyroid No results for input(s): "TSH", "FREET4" in the last 168 hours.  BNP Recent Labs  Lab 06/29/23 1016  BNP 337.0*    DDimer No results for input(s): "DDIMER" in the last 168 hours.   Radiology/Studies:  DG Chest Port 1 View  Result Date: 06/29/2023 CLINICAL DATA:  Shortness of breath EXAM: PORTABLE CHEST - 1 VIEW COMPARISON:  06/06/2023 FINDINGS: Unchanged cardiomegaly and pulmonary vascular congestion. Unchanged left basilar airspace opacity. Postsurgical changes of CABG again seen. Right chest port unchanged in position. IMPRESSION: Unchanged cardiomegaly and pulmonary vascular congestion indicative of heart failure/fluid volume overload. Unchanged left basilar opacity, likely due to atelectasis. Electronically Signed   By: Acquanetta Belling M.D.   On: 06/29/2023 11:28     Assessment and Plan:   1.Acute on chronic HFrEF - 05/2023 LVEF 35-40%, indet diastolic fxn, mild RV dysfunction, mod AS - 06/2023 [facility] mean PA 38, PCWP 28/34, CI 2.83 - CXR pulm edema chronic, BNP 300s, elevated JVD and crackles one exam - dose IV lasix 80mg  x 1 in ER, monitor output and potentially redose tomorrow.    - no beta blocker due to prior bradycardia. Per 06/02/23 [facility] cards note on entresot 49/51mg  bid. Defer considerations for MRA and SGLT2i to his primary cardiologist as we only have limited history on him.   2.Aortic stenosis 05/2023 echo: mean grad 37, AVA VTI 1, DI 0.322 Area by cath 1.2, gradient 22-25 - followed by [facility], essentially moderate AS from collection of data - defer ongoing observation to his primary cardiologist  3.Pulmonary HTN - 06/2023 [facility] mean PA 38, PCWP 28/34, CI 2.83 - cath consistent with group II pulmonary HTN due to left sided systolic dysfunction   4. CAD - history of prior CABG - 06/2023 cath [facility] CTO of LAD, RCA, OM. Patent SVG- RCA, SVG-D1,  occlued SVG-OM, patent LIMA-LAD. No PCI targets. - no acute issues   5. Afib - off av nodal agents due to bradycardia - from notes had been on dofetlide at one point, unclear when and why discontinued - currently self rate controlled, rates 70s to 80s - he is on eliquis  6. COPD  - on home O2 2L chronically  7. NHL - per notes history of NHL in remission   Signed, Dina Rich, MD  06/29/2023 11:53 AM

## 2023-06-29 NOTE — ED Triage Notes (Signed)
Pt c/o SOB that started this morning, denies chest pain, denies overall pain, states he "just can not breath"

## 2023-06-30 DIAGNOSIS — I5041 Acute combined systolic (congestive) and diastolic (congestive) heart failure: Secondary | ICD-10-CM | POA: Diagnosis not present

## 2023-06-30 DIAGNOSIS — I5023 Acute on chronic systolic (congestive) heart failure: Secondary | ICD-10-CM | POA: Diagnosis not present

## 2023-06-30 DIAGNOSIS — I509 Heart failure, unspecified: Secondary | ICD-10-CM

## 2023-06-30 LAB — GLUCOSE, CAPILLARY
Glucose-Capillary: 175 mg/dL — ABNORMAL HIGH (ref 70–99)
Glucose-Capillary: 190 mg/dL — ABNORMAL HIGH (ref 70–99)

## 2023-06-30 MED ORDER — FUROSEMIDE 20 MG PO TABS
60.0000 mg | ORAL_TABLET | Freq: Every day | ORAL | 11 refills | Status: AC
Start: 2023-06-30 — End: 2024-06-29

## 2023-06-30 MED ORDER — GUAIFENESIN ER 600 MG PO TB12
600.0000 mg | ORAL_TABLET | Freq: Two times a day (BID) | ORAL | Status: DC
  Administered 2023-06-30: 600 mg via ORAL
  Filled 2023-06-30: qty 1

## 2023-06-30 MED ORDER — SACUBITRIL-VALSARTAN 49-51 MG PO TABS
1.0000 | ORAL_TABLET | Freq: Two times a day (BID) | ORAL | Status: DC
  Administered 2023-06-30: 1 via ORAL
  Filled 2023-06-30: qty 1

## 2023-06-30 MED ORDER — SACUBITRIL-VALSARTAN 49-51 MG PO TABS: 1.0000 | ORAL_TABLET | Freq: Two times a day (BID) | ORAL | 0 refills | Status: AC

## 2023-06-30 MED ORDER — POTASSIUM CHLORIDE CRYS ER 20 MEQ PO TBCR
40.0000 meq | EXTENDED_RELEASE_TABLET | Freq: Two times a day (BID) | ORAL | Status: DC
  Administered 2023-06-30: 40 meq via ORAL
  Filled 2023-06-30: qty 2

## 2023-06-30 MED ORDER — FUROSEMIDE 10 MG/ML IJ SOLN
40.0000 mg | Freq: Once | INTRAMUSCULAR | Status: AC
  Administered 2023-06-30: 40 mg via INTRAVENOUS
  Filled 2023-06-30: qty 4

## 2023-06-30 NOTE — Care Management CC44 (Signed)
Condition Code 44 Documentation Completed  Patient Details  Name: Alejandro [name] MRN: [id number] Date of Birth: 06-20-1941   Condition Code 44 given:  Yes Patient signature on Condition Code 44 notice:  Yes Documentation of 2 MD's agreement:  Yes Code 44 added to claim:  Yes    Annice Needy, LCSW 06/30/2023, 1:45 PM

## 2023-06-30 NOTE — Discharge Summary (Signed)
Physician Discharge Summary  Alejandro [name] VHQ:[id number] DOB: 07/07/1941 DOA: 06/29/2023  PCP: Roe Rutherford, NP  Admit date: 06/29/2023  Discharge date: 06/30/2023  Admitted From:Home  Disposition:  Home  Recommendations for Outpatient Follow-up:  Follow up with PCP in 1-2 weeks Follow-up with cardiology at [facility] and continue on Entresto and Lasix dosing as prescribed No beta-blocker for now due to prior bradycardia and defer further considerations for MRA and SGLT2 inhibitor to primary cardiologist Continue other home medications as prior  Home Health: None  Equipment/Devices: Has home oxygen 2 L nasal cannula  Discharge Condition:Stable  CODE STATUS: Full  Diet recommendation: Heart Healthy  Brief/Interim Summary: Alejandro [name] is a 82 y.o. male with medical history significant for CAD with prior CABG, moderate aortic stenosis, PAF on Eliquis, HFrEF, chronic hypoxemia, bradycardia, gout, and GERD who presented to the ED with significant shortness of breath that began suddenly while he was sitting on his couch.  He was admitted with acute on chronic HFrEF/pulmonary hypertension and was given IV Lasix overnight with improvement in his clinical condition after diuresis.  He is overall feeling well and appears stable for discharge from cardiology perspective and has been recommended to now remain on Lasix 60 mg daily from 40 mg daily previously.  He will need close follow-up with cardiology at [facility].  No other acute concerns or events noted.  Discharge Diagnoses:  Principal Problem:   Acute CHF (HCC) Active Problems:   Essential hypertension   CAD (coronary artery disease)   Diabetes mellitus (HCC)   Hyperlipidemia   GERD (gastroesophageal reflux disease)   Obesity   Aortic stenosis   S/P CABG x 4  Principal discharge diagnosis: Acute on chronic HFrEF/pulmonary hypertension.  Discharge Instructions  Discharge Instructions     Diet - low sodium heart healthy    Complete by: As directed    Increase activity slowly   Complete by: As directed       Allergies as of 06/30/2023   No Known Allergies      Medication List     STOP taking these medications    azithromycin 250 MG tablet Commonly known as: ZITHROMAX       TAKE these medications    acetaminophen 500 MG tablet Commonly known as: TYLENOL Take 1,000 mg by mouth every 6 (six) hours as needed for moderate pain or headache.   albuterol 108 (90 Base) MCG/ACT inhaler Commonly known as: VENTOLIN HFA Inhale 2 puffs into the lungs every 6 (six) hours as needed for wheezing or shortness of breath.   albuterol 1.25 MG/3ML nebulizer solution Commonly known as: ACCUNEB Take 1 ampule by nebulization every 6 (six) hours as needed for wheezing or shortness of breath.   allopurinol 300 MG tablet Commonly known as: ZYLOPRIM Take 1 tablet (300 mg total) by mouth daily.   apixaban 5 MG Tabs tablet Commonly known as: Eliquis Take 1 tablet (5 mg total) by mouth 2 (two) times daily.   atorvastatin 40 MG tablet Commonly known as: LIPITOR Take 40 mg by mouth at bedtime.   bisacodyl 5 MG EC tablet Commonly known as: DULCOLAX Take 5 mg by mouth daily as needed for moderate constipation.   cyanocobalamin 1000 MCG tablet Commonly known as: VITAMIN B12 Take 1,000 mcg by mouth daily.   cycloSPORINE 0.05 % ophthalmic emulsion Commonly known as: RESTASIS Place 1 drop into both eyes 2 (two) times daily.   docusate sodium 100 MG capsule Commonly known as: COLACE Take 300 mg by  mouth daily as needed for mild constipation.   famotidine 20 MG tablet Commonly known as: PEPCID Take 20 mg by mouth 2 (two) times daily.   fluticasone 50 MCG/ACT nasal spray Commonly known as: FLONASE Place 1 spray into both nostrils daily.   furosemide 20 MG tablet Commonly known as: Lasix Take 3 tablets (60 mg total) by mouth daily. What changed:  medication strength how much to take   glipiZIDE 10 MG  24 hr tablet Commonly known as: GLUCOTROL XL Take 10 mg by mouth daily.   loratadine 10 MG tablet Commonly known as: CLARITIN Take 10 mg by mouth daily.   Melatonin 10 MG Caps Take 30 mg by mouth at bedtime as needed (sleep).   metFORMIN 750 MG 24 hr tablet Commonly known as: GLUCOPHAGE-XR Take 1 tablet by mouth 2 (two) times daily with a meal.   pantoprazole 40 MG tablet Commonly known as: PROTONIX Take 40 mg by mouth daily.   potassium chloride 10 MEQ CR capsule Commonly known as: MICRO-K Take 10 mEq by mouth daily.   sacubitril-valsartan 49-51 MG Commonly known as: ENTRESTO Take 1 tablet by mouth 2 (two) times daily.        Follow-up Information     Randa Ngo, MD. Schedule an appointment as soon as possible for a visit in 2 week(s).   Specialty: Internal Medicine Why: Make a Cardiology Follow-up Appointment within 2 weeks. Contact information: [address] [facility] [address] [place] [address] [phone]                No Known Allergies  Consultations: Cardiology   Procedures/Studies: [facility] Chest Port 1 View  Result Date: 06/29/2023 CLINICAL DATA:  Shortness of breath EXAM: PORTABLE CHEST - 1 VIEW COMPARISON:  06/06/2023 FINDINGS: Unchanged cardiomegaly and pulmonary vascular congestion. Unchanged left basilar airspace opacity. Postsurgical changes of CABG again seen. Right chest port unchanged in position. IMPRESSION: Unchanged cardiomegaly and pulmonary vascular congestion indicative of heart failure/fluid volume overload. Unchanged left basilar opacity, likely due to atelectasis. Electronically Signed   By: Acquanetta Belling M.D.   On: 06/29/2023 11:28   DG Chest Portable 1 View  Result Date: 06/06/2023 CLINICAL DATA:  Short of breath beginning this morning. EXAM: PORTABLE CHEST 1 VIEW COMPARISON:  05/17/2023.  CT, 12/07/2022. FINDINGS: Stable changes from prior CABG surgery. Cardiac silhouette borderline enlarged. No mediastinal  or hilar masses. Lung volumes are low. Bilateral vascular congestion. Mild interstitial thickening. Additional opacity at the left lung base suspected to be atelectasis with a possible small effusion. No pneumothorax. Right internal jugular Port-A-Cath, tip in the upper superior vena cava, stable. Skeletal structures are grossly intact. IMPRESSION: 1. Borderline cardiac enlargement, vascular congestion mild interstitial thickening. Suspect mild congestive heart failure. Findings are similar to the chest radiograph dated 05/17/2023. Electronically Signed   By: Amie Portland M.D.   On: 06/06/2023 11:00     Discharge Exam: Vitals:   06/30/23 0207 06/30/23 0611  BP: 135/73 126/63  Pulse: 87 88  Resp: 18 16  Temp: 98.6 F (37 C) 98.6 F (37 C)  SpO2: 99% 99%   Vitals:   06/29/23 2232 06/30/23 0207 06/30/23 0443 06/30/23 0611  BP: (!) 153/84 135/73  126/63  Pulse: 82 87  88  Resp: 18 18  16   Temp: 98.5 F (36.9 C) 98.6 F (37 C)  98.6 F (37 C)  TempSrc: Oral Oral  Oral  SpO2: 100% 99%  99%  Weight:   100.8 kg  Height:        General: Pt is alert, awake, not in acute distress Cardiovascular: RRR, S1/S2 +, no rubs, no gallops Respiratory: CTA bilaterally, no wheezing, no rhonchi, 2 L nasal cannula Abdominal: Soft, NT, ND, bowel sounds + Extremities: no edema, no cyanosis    The results of significant diagnostics from this hospitalization (including imaging, microbiology, ancillary and laboratory) are listed below for reference.     Microbiology: Recent Results (from the past 240 hour(s))  SARS Coronavirus 2 by RT PCR (hospital order, performed in [facility] hospital lab) *cepheid single result test* Anterior Nasal Swab     Status: None   Collection Time: 06/29/23 10:30 AM   Specimen: Anterior Nasal Swab  Result Value Ref Range Status   SARS Coronavirus 2 by RT PCR NEGATIVE NEGATIVE Final    Comment: (NOTE) SARS-CoV-2 target nucleic acids are NOT DETECTED.  The SARS-CoV-2  RNA is generally detectable in upper and lower respiratory specimens during the acute phase of infection. The lowest concentration of SARS-CoV-2 viral copies this assay can detect is 250 copies / mL. A negative result does not preclude SARS-CoV-2 infection and should not be used as the sole basis for treatment or other patient management decisions.  A negative result may occur with improper specimen collection / handling, submission of specimen other than nasopharyngeal swab, presence of viral mutation(s) within the areas targeted by this assay, and inadequate number of viral copies (<250 copies / mL). A negative result must be combined with clinical observations, patient history, and epidemiological information.  Fact Sheet for Patients:   RoadLapTop.co.za  Fact Sheet for Healthcare Providers: http://kim-miller.com/  This test is not yet approved or  cleared by the [place] FDA and has been authorized for detection and/or diagnosis of SARS-CoV-2 by FDA under an Emergency Use Authorization (EUA).  This EUA will remain in effect (meaning this test can be used) for the duration of the COVID-19 declaration under Section 564(b)(1) of the Act, 21 U.S.C. section 360bbb-3(b)(1), unless the authorization is terminated or revoked sooner.  Performed at [facility], [address], [place], [place] [address]      Labs: BNP (last 3 results) Recent Labs    05/17/23 1346 06/06/23 1056 06/29/23 1016  BNP 269.0* 326.0* 337.0*   Basic Metabolic Panel: Recent Labs  Lab 06/29/23 1016 06/30/23 0422  NA 132* 136  K 3.3* 3.3*  CL 101 101  CO2 25 26  GLUCOSE 298* 163*  BUN 10 11  CREATININE 0.94 0.80  CALCIUM 8.4* 8.4*  MG 1.8 2.2   Liver Function Tests: No results for input(s): "AST", "ALT", "ALKPHOS", "BILITOT", "PROT", "ALBUMIN" in the last 168 hours. No results for input(s): "LIPASE", "AMYLASE" in the last 168 hours. No results  for input(s): "AMMONIA" in the last 168 hours. CBC: Recent Labs  Lab 06/29/23 1016  WBC 9.4  NEUTROABS 7.1  HGB 12.2*  HCT 37.3*  MCV 89.7  PLT 244   Cardiac Enzymes: No results for input(s): "CKTOTAL", "CKMB", "CKMBINDEX", "TROPONINI" in the last 168 hours. BNP: Invalid input(s): "POCBNP" CBG: Recent Labs  Lab 06/29/23 1651 06/29/23 2002 06/30/23 0723 06/30/23 1108  GLUCAP 159* 267* 175* 190*   D-Dimer No results for input(s): "DDIMER" in the last 72 hours. Hgb A1c No results for input(s): "HGBA1C" in the last 72 hours. Lipid Profile No results for input(s): "CHOL", "HDL", "LDLCALC", "TRIG", "CHOLHDL", "LDLDIRECT" in the last 72 hours. Thyroid function studies No results for input(s): "TSH", "T4TOTAL", "T3FREE", "THYROIDAB" in  the last 72 hours.  Invalid input(s): "FREET3" Anemia work up No results for input(s): "VITAMINB12", "FOLATE", "FERRITIN", "TIBC", "IRON", "RETICCTPCT" in the last 72 hours. Urinalysis    Component Value Date/Time   COLORURINE YELLOW 12/07/2022 1020   APPEARANCEUR CLEAR 12/07/2022 1020   LABSPEC 1.014 12/07/2022 1020   PHURINE 8.0 12/07/2022 1020   GLUCOSEU 150 (A) 12/07/2022 1020   HGBUR NEGATIVE 12/07/2022 1020   BILIRUBINUR NEGATIVE 12/07/2022 1020   KETONESUR NEGATIVE 12/07/2022 1020   PROTEINUR NEGATIVE 12/07/2022 1020   NITRITE NEGATIVE 12/07/2022 1020   LEUKOCYTESUR NEGATIVE 12/07/2022 1020   Sepsis Labs Recent Labs  Lab 06/29/23 1016  WBC 9.4   Microbiology Recent Results (from the past 240 hour(s))  SARS Coronavirus 2 by RT PCR (hospital order, performed in [facility] hospital lab) *cepheid single result test* Anterior Nasal Swab     Status: None   Collection Time: 06/29/23 10:30 AM   Specimen: Anterior Nasal Swab  Result Value Ref Range Status   SARS Coronavirus 2 by RT PCR NEGATIVE NEGATIVE Final    Comment: (NOTE) SARS-CoV-2 target nucleic acids are NOT DETECTED.  The SARS-CoV-2 RNA is generally detectable in  upper and lower respiratory specimens during the acute phase of infection. The lowest concentration of SARS-CoV-2 viral copies this assay can detect is 250 copies / mL. A negative result does not preclude SARS-CoV-2 infection and should not be used as the sole basis for treatment or other patient management decisions.  A negative result may occur with improper specimen collection / handling, submission of specimen other than nasopharyngeal swab, presence of viral mutation(s) within the areas targeted by this assay, and inadequate number of viral copies (<250 copies / mL). A negative result must be combined with clinical observations, patient history, and epidemiological information.  Fact Sheet for Patients:   RoadLapTop.co.za  Fact Sheet for Healthcare Providers: http://kim-miller.com/  This test is not yet approved or  cleared by the [place] FDA and has been authorized for detection and/or diagnosis of SARS-CoV-2 by FDA under an Emergency Use Authorization (EUA).  This EUA will remain in effect (meaning this test can be used) for the duration of the COVID-19 declaration under Section 564(b)(1) of the Act, 21 U.S.C. section 360bbb-3(b)(1), unless the authorization is terminated or revoked sooner.  Performed at [facility], [address] [place], [place] [address]      Time coordinating discharge: 35 minutes  SIGNED:   Erick Blinks, DO [facility] 06/30/2023, 1:08 PM  If 7PM-7AM, please contact night-coverage [email or web address]

## 2023-06-30 NOTE — TOC Initial Note (Signed)
Transition of Care [facility]) - Initial/Assessment Note    Patient Details  Name: Alejandro [name] MRN: [id number] Date of Birth: Apr 27, 1941  Transition of Care [facility]) CM/SW Contact:    Annice Needy, LCSW Phone Number: 06/30/2023, 11:05 AM  Clinical Narrative:                 Patient from home alone. Lianne Moris, provided history. Patient ambulates independently when in the home and uses a cane when he is out. He drives. Patient has home oxygen (portable, concentrator, and emergency tank). Patient is independent with ADLs. He has an "entourage of women" who bring him meals since his wife passed. Patient does not follow a HH diet and does not take daily weights.  Apolinar Junes provides assistance with needed as he lives eight minutes from patient. Plan is for patient to d/c home.   Expected Discharge Plan: Home/Self Care Barriers to Discharge: Continued Medical Work up   Patient Goals and CMS Choice Patient states their goals for this hospitalization and ongoing recovery are:: return home          Expected Discharge Plan and Services     Post Acute Care Choice: Durable Medical Equipment Living arrangements for the past 2 months: Single Family Home                                      Prior Living Arrangements/Services Living arrangements for the past 2 months: Single Family Home Lives with:: Self Patient language and need for interpreter reviewed:: Yes        Need for Family Participation in Patient Care: Yes (Comment) Care giver support system in place?: Yes (comment) Current home services: DME (cane, oxygen) Criminal Activity/Legal Involvement Pertinent to Current Situation/Hospitalization: No - Comment as needed  Activities of Daily Living Home Assistive Devices/Equipment: Walker (specify type) ADL Screening (condition at time of admission) Patient's cognitive ability adequate to safely complete daily activities?: Yes Is the patient deaf or have difficulty  hearing?: No Does the patient have difficulty seeing, even when wearing glasses/contacts?: No Does the patient have difficulty concentrating, remembering, or making decisions?: No Patient able to express need for assistance with ADLs?: Yes Does the patient have difficulty dressing or bathing?: No Independently performs ADLs?: Yes (appropriate for developmental age) Does the patient have difficulty walking or climbing stairs?: Yes Weakness of Legs: Both Weakness of Arms/Hands: None  Permission Sought/Granted Permission sought to share information with : Family Supports    Share Information with NAME: Elder Cyphers, grandson           Emotional Assessment       Orientation: : Oriented to Situation, Oriented to  Time, Oriented to Place, Oriented to Self Alcohol / Substance Use: Not Applicable Psych Involvement: No (comment)  Admission diagnosis:  Acute CHF (HCC) [I50.9] Patient Active Problem List   Diagnosis Date Noted   Acute CHF (HCC) 06/29/2023   Acute on chronic diastolic CHF (congestive heart failure) (HCC) 02/14/2023   Paroxysmal atrial fibrillation (HCC) 02/14/2023   Follicular lymphoma of lymph nodes of inguinal region (HCC) 01/06/2023   Follicular lymphoma grade 3a (HCC) 12/23/2022   Inguinal lymphadenopathy 12/15/2022   Mediastinal adenopathy 11/17/2022   Generalized lymphadenopathy 09/16/2022   Headache 10/28/2021   Upper airway cough syndrome 10/28/2021   Hypothermia    Uncontrolled type 2 diabetes mellitus with hyperglycemia, without long-term current use of insulin (HCC) 07/13/2021   (  HFimpEF) heart failure with improved EF 07/13/2021   SIRS (systemic inflammatory response syndrome) (HCC) 07/11/2021   Acute metabolic encephalopathy 07/11/2021   S/P CABG x 4 05/11/2018   CKD (chronic kidney disease), stage III (HCC) 05/07/2018   Obesity 05/07/2018   Aortic stenosis 05/07/2018   Non-ST elevation (NSTEMI) myocardial infarction (HCC) 05/07/2018   CAD  (coronary artery disease) 05/07/2018   DOE (dyspnea on exertion)    Leukocytosis 05/02/2018   Flash pulmonary edema (HCC) 05/02/2018   Diabetic neuropathy with neurologic complication (HCC) 03/29/2013   Spinal stenosis of lumbar region 03/29/2013   Melena 03/02/2013   GERD (gastroesophageal reflux disease) 09/02/2012   Helicobacter pylori (H. pylori) 09/02/2012   Essential hypertension 01/08/2012   Diabetes mellitus (HCC) 01/08/2012   Hyperlipidemia    PCP:  Roe Rutherford, NP Pharmacy:   [facility] [phone] - [place], [place] - [address] [place] [address] Phone: [phone] Fax: [phone]  [facility] - [place], [place] - [address] [place] [address] Phone: [phone] Fax: [phone]     Social Determinants of Health (SDOH) Social History: SDOH Screenings   Food Insecurity: No Food Insecurity (06/29/2023)  Housing: Low Risk  (06/29/2023)  Transportation Needs: No Transportation Needs (06/29/2023)  Utilities: Not At Risk (06/29/2023)  Depression (PHQ2-9): Low Risk  (11/12/2018)  Financial Resource Strain: Low Risk  (05/29/2023)   Received from [facility]  Physical Activity: Unknown (05/29/2023)   Received from [facility]  Social Connections: Socially Integrated (05/29/2023)   Received from [facility]  Stress: Stress Concern Present (06/12/2023)   Received from [facility]  Tobacco Use: Low Risk  (06/29/2023)   SDOH Interventions:     Readmission Risk Interventions    02/16/2023    2:59 PM  Readmission Risk Prevention Plan  Transportation Screening Complete  HRI or Home Care Consult Complete  Social Work Consult for Recovery Care Planning/Counseling Complete  Palliative Care Screening Not Applicable  Medication Review Oceanographer) Complete

## 2023-06-30 NOTE — Progress Notes (Signed)
Mobility Specialist Progress Note:    06/30/23 1320  Mobility  Activity Ambulated with assistance in hallway  Level of Assistance Standby assist, set-up cues, supervision of patient - no hands on  Assistive Device Front wheel walker  Distance Ambulated (ft) 150 ft  Range of Motion/Exercises Active;All extremities  Activity Response Tolerated well  Mobility Referral Yes  $Mobility charge 1 Mobility  Mobility Specialist Start Time (ACUTE ONLY) 1320  Mobility Specialist Stop Time (ACUTE ONLY) 1330  Mobility Specialist Time Calculation (min) (ACUTE ONLY) 10 min   Pt was received laying in bed, agreeable to mobility session. SBA to stand and ambulate with RW. SpO2 91% on 3L while ambulating. Tolerated well, asx throughout. Returned pt to room, sitting EOB, all needs met.   Lawerance Bach Mobility Specialist Please contact via Special educational needs teacher or  Rehab office at [phone]

## 2023-06-30 NOTE — Care Management Obs Status (Signed)
MEDICARE OBSERVATION STATUS NOTIFICATION   Patient Details  Name: Alejandro [name] MRN: [id number] Date of Birth: Apr 07, 1941   Medicare Observation Status Notification Given:  Yes    Annice Needy, LCSW 06/30/2023, 1:45 PM

## 2023-06-30 NOTE — Progress Notes (Addendum)
Rounding Note    Patient Name: Alejandro [name] Date of Encounter: 06/30/2023  [facility] Cardiologist: Previously Dr. Diona Browner - Now followed by [facility]   Subjective   Reports breathing has improved. No orthopnea or PND. Still with some edema along his right leg which he says has improved. No chest pain or palpitations. Anxious to go home today.   Inpatient Medications    Scheduled Meds:  allopurinol  300 mg Oral Daily   apixaban  5 mg Oral BID   atorvastatin  40 mg Oral QHS   cycloSPORINE  1 drop Both Eyes BID   fluticasone  1 spray Each Nare Daily   furosemide  40 mg Intravenous Once   guaiFENesin  600 mg Oral BID   insulin aspart  0-15 Units Subcutaneous TID WC   insulin aspart  0-5 Units Subcutaneous QHS   loratadine  10 mg Oral Daily   pantoprazole  40 mg Oral Daily   potassium chloride  40 mEq Oral BID   sacubitril-valsartan  1 tablet Oral BID   sodium chloride flush  3 mL Intravenous Q12H   Continuous Infusions:  sodium chloride     PRN Meds: sodium chloride, acetaminophen, albuterol, bisacodyl, docusate sodium, ondansetron (ZOFRAN) IV, sodium chloride flush   Vital Signs    Vitals:   06/29/23 2232 06/30/23 0207 06/30/23 0443 06/30/23 0611  BP: (!) 153/84 135/73  126/63  Pulse: 82 87  88  Resp: 18 18  16   Temp: 98.5 F (36.9 C) 98.6 F (37 C)  98.6 F (37 C)  TempSrc: Oral Oral  Oral  SpO2: 100% 99%  99%  Weight:   100.8 kg   Height:        Intake/Output Summary (Last 24 hours) at 06/30/2023 1009 Last data filed at 06/30/2023 0145 Gross per 24 hour  Intake 408 ml  Output 1350 ml  Net -942 ml      06/30/2023    4:43 AM 06/29/2023    2:20 PM 06/29/2023   10:06 AM  Last 3 Weights  Weight (lbs) 222 lb 3.6 oz 223 lb 15.8 oz 232 lb  Weight (kg) 100.8 kg 101.6 kg 105.235 kg      Telemetry    Atrial fibrillation, HR in 60's to 70's with frequent PVC's. 12 beats NSVT around 0400. - Personally Reviewed  ECG    Rate-controlled  atrial fibrillation, HR 80 with IVCD and PVC's.  - Personally Reviewed  Physical Exam   GEN: No acute distress.   Neck: No JVD Cardiac: Irregularly irregular, 2/6 SEM along RUSB.  Respiratory: Rales along right base. Scattered rhonchi. No wheezing.  GI: Soft, nontender, non-distended  MS: Trace edema along RLE, no edema along LLE; No deformity. Neuro:  Nonfocal  Psych: Normal affect   Labs    High Sensitivity Troponin:   Recent Labs  Lab 06/29/23 1016 06/29/23 1223  TROPONINIHS 16 15     Chemistry Recent Labs  Lab 06/29/23 1016 06/30/23 0422  NA 132* 136  K 3.3* 3.3*  CL 101 101  CO2 25 26  GLUCOSE 298* 163*  BUN 10 11  CREATININE 0.94 0.80  CALCIUM 8.4* 8.4*  MG 1.8 2.2  GFRNONAA >60 >60  ANIONGAP 6 9    Lipids No results for input(s): "CHOL", "TRIG", "HDL", "LABVLDL", "LDLCALC", "CHOLHDL" in the last 168 hours.  Hematology Recent Labs  Lab 06/29/23 1016  WBC 9.4  RBC 4.16*  HGB 12.2*  HCT 37.3*  MCV 89.7  MCH 29.3  MCHC 32.7  RDW 14.3  PLT 244   Thyroid No results for input(s): "TSH", "FREET4" in the last 168 hours.  BNP Recent Labs  Lab 06/29/23 1016  BNP 337.0*    DDimer No results for input(s): "DDIMER" in the last 168 hours.   Radiology    DG Chest Port 1 View  Result Date: 06/29/2023 CLINICAL DATA:  Shortness of breath EXAM: PORTABLE CHEST - 1 VIEW COMPARISON:  06/06/2023 FINDINGS: Unchanged cardiomegaly and pulmonary vascular congestion. Unchanged left basilar airspace opacity. Postsurgical changes of CABG again seen. Right chest port unchanged in position. IMPRESSION: Unchanged cardiomegaly and pulmonary vascular congestion indicative of heart failure/fluid volume overload. Unchanged left basilar opacity, likely due to atelectasis. Electronically Signed   By: Acquanetta Belling M.D.   On: 06/29/2023 11:28    Cardiac Studies   Echocardiogram: 01/2023 IMPRESSIONS     1. Left ventricular ejection fraction, by estimation, is 45 to 50%. The   left ventricle has mildly decreased function. The left ventricle  demonstrates regional wall motion abnormalities (see scoring  diagram/findings for description). The left ventricular   internal cavity size was moderately dilated. Left ventricular diastolic  parameters are consistent with Grade II diastolic dysfunction  (pseudonormalization).   2. Right ventricular systolic function is normal. The right ventricular  size is mildly enlarged. There is mildly elevated pulmonary artery  systolic pressure.   3. Left atrial size was severely dilated.   4. Right atrial size was mildly dilated.   5. The mitral valve is normal in structure. No evidence of mitral valve  regurgitation. No evidence of mitral stenosis.   6. The aortic valve is calcified. There is moderate calcification of the  aortic valve. There is moderate thickening of the aortic valve. Aortic  valve regurgitation is not visualized. Moderate aortic valve stenosis.  Aortic valve mean gradient measures  19.0 mmHg. Aortic valve Vmax measures 2.97 m/s.   7. Aortic dilatation noted. There is mild dilatation of the aortic root,  measuring 43 mm.   8. The inferior vena cava is normal in size with greater than 50%  respiratory variability, suggesting right atrial pressure of 3 mmHg.   Comparison(s): No significant change from prior study. Prior images  reviewed side by side.   Echocardiogram: 05/2023 Left Ventricle: Systolic function is moderate to severely abnormal. EF:  35-40%. Quantitative analysis of left ventricular Global Longitudinal  Strain (GLS) imaging is -6.100%.    Left Ventricle: Left ventricle size is normal.    Left Atrium: Left atrium is moderately dilated at 5.900 cm.    Right Ventricle: Systolic function is mildly reduced. Abnormal  tricuspid annular plane systolic excursion (TAPSE) <1.7 cm.    Aortic Valve: There is moderate to severe stenosis, with peak and mean  gradients of 66.000 and 37.000 mmHg, valve area  of 1 sq cm    Mitral Valve: There is mild to moderate regurgitation with a centrally  directed jet.    Tricuspid Valve: The right ventricular systolic pressure is mildly  elevated (37-49 mmHg).   Cardiac Catheterization: 06/2023 Prox RCA lesion is 100% stenosed.    Prox LAD lesion is 100% stenosed.    1st Mrg lesion is 100% stenosed.    Origin to Prox Graft lesion is 100% stenosed.    Mid Cx to Dist Cx lesion is 20% stenosed.    RPDA lesion is 50% stenosed.   Findings:  - Multivessel coronary disease including RCA CTO, chronic LAD CTO, obtuse  marginal  CTO, nonobstructive disease otherwise  -Patent saphenous vein graft to right coronary artery, patent vein graft  to first diagonal, occluded vein graft presumably to obtuse marginal,  nonobstructive circumflex artery disease, patent LIMA to LAD  -No target for PCI  -Severe pulmonary hypertension  -22 to 25 mm aortic valve gradient  -Aortic valve area calculates to approximate 1.2 cm   Recommendations:  -Will receive opinion about need for TAVR; echocardiogram was reviewed and  it does not appear likely that this aortic stenosis is critical  -Dyspnea is likely multifactorial due to pulmonary hypertension, left  ventricular dysfunction and mild to moderate aortic and mitral valve  disease   Complications:  None   Patient Profile     82 y.o. male w/ PMH of CAD (s/p CABG in 05/2018, cath in 06/2023 showing patent SVG-RCA, patent SVG-D1, occluded SVG-OM and patent LIMA-LAD with no targets for PCI and medical management recommended), paroxysmal atrial fibrillation (previously on Tikosyn but stopped in 01/2023 due to episodes of NSVT and Torsades in the setting of prolonged QTc), HFmrEF (EF 45-50% by echo in 01/2023, at 35-40% by echo in 05/2023), Stage 3 high-grade follicular lymphoma, aortic stenosis, HTN, HLD, Lyme Disease and Stage 3 CKD who is currently admitted with an acute CHF exacerbation.   Assessment & Plan    1. Acute on  Chronic HFrEF/Pulmonary HTN - Most recent echocardiogram showed his EF was reduced at 35 to 40% and RV function was mildly reduced. He did have severe pulmonary hypertension by recent catheterization as well which is felt to be Group 2 in the setting of his reduced EF. - BNP elevated to 337 on admission and CXR consistent with CHF. He did receive IV Lasix 80 mg x 1 yesterday with a recorded net output of -942 mL and weight down 1 lb to 222 lbs. Creatinine stable at 0.80 this morning and K+ low at 3.3 (replacement already ordered).  - He has not been on a beta-blocker due to bradycardia. He is on Entresto 49-51mg  BID as an outpatient and will reorder. He does have some volume overload on exam today and would dose IV Lasix 40mg  x1. He wants to go home later today which is probably reasonable with close outpatient follow-up. Was taking Lasix 40mg  daily at home and would likely titrate to 60mg  daily  2. CAD - He underwent CABG in 2019 and recent catheterization earlier this month showed an occluded SVG to OM but patent SVG to RCA, SVG to D1 and LIMA to LAD with no targets for PCI and medical management recommended. - Hs Troponin values have been negative at 16 and 15. Continue Atorvastatin 40 mg daily. He is not on ASA given the need for anticoagulation and is no longer on beta-blocker therapy due to bradycardia.  3. Paroxysmal Atrial Fibrillation - He has been off AV nodal blocking agents due to bradycardia and was previously on Tikosyn but this was discontinued during an admission in 01/2023 due to prolonged QT and episodes of Torsades (also had electrolyte abnormalities at that time and received Azithromycin). - He remains on Eliquis 5 mg twice daily for anticoagulation.  4. Stage 3 High-grade Follicular Lymphoma - Followed by Oncology as an outpatient. Previously on chemotherapy but stopped in 04/2023 due to poor tolerance and surveillance scans recommended.   5. COPD - He is on 2L [place] at baseline.  Was initially requiring 3L but now down to 2L.   6. NSVT - He did have 12 beats NSVT on telemetry.  Mg 2.2 this AM. K+ 3.3 today and supplementation has already been ordered by the admitting team. Keep K+ ~ 4.0.  For questions or updates, please contact [facility] Please consult [email or web address] for contact info under        Signed, Ellsworth Lennox, PA-C  06/30/2023, 10:09 AM    Attending note Patient seen and discussed with PA Iran Ouch, I agree with her documentation.   1.Acute on chronic HFrEF - 05/2023 LVEF 35-40%, indet diastolic fxn, mild RV dysfunction, mod AS - 06/2023 RHC mean PA 38, PCWP 28/34, CI 2.83 - CXR pulm edema chronic, BNP 300s, elevated JVD and crackles on exam  -I/Os incomplete, weights appear inaccurate. Received IV lasix 80mg  yesterday, downtrend in Cr with diuresis consistent with venous congestion and HF. Dosed IV lasix 40mg  this AM. Check reds vest - ambulate patient and document symptoms, possible d/c later today. Would change his home oral lasix to 60mg  daily.    - no beta blocker due to prior bradycardia. Per 06/02/23 [facility] cards note on entresot 49/51mg  bid. Defer considerations for MRA and SGLT2i to his primary cardiologist as we only have limited history on him.    2.Aortic stenosis 05/2023 echo: mean grad 37, AVA VTI 1, DI 0.322 Area by cath 1.2, gradient 22-25 - followed by [facility], essentially moderate AS from collection of data - defer ongoing observation to his primary cardiologist   3.Pulmonary HTN - 06/2023 RHC mean PA 38, PCWP 28/34, CI 2.83 - cath consistent with group II pulmonary HTN due to left sided systolic dysfunction     4. CAD - history of prior CABG - 06/2023 cath [facility] CTO of LAD, RCA, OM. Patent SVG- RCA, SVG-D1, occlued SVG-OM, patent LIMA-LAD. No PCI targets. - no acute issues     5. Afib - off av nodal agents due to bradycardia - from notes had been on dofetlide at one point, unclear when and why discontinued -  currently self rate controlled, rates 70s to 80s - he is on eliquis   6. COPD  - on home O2 2L chronically   7. NHL - per notes history of NHL in remission

## 2023-07-07 ENCOUNTER — Encounter (HOSPITAL_COMMUNITY)
Admission: RE | Admit: 2023-07-07 | Discharge: 2023-07-07 | Disposition: A | Discharge status: Home / Self Care | Payer: Medicare Other | Source: Ambulatory Visit | Attending: Ophthalmology | Admitting: Ophthalmology

## 2023-07-07 NOTE — Progress Notes (Signed)
Dr Alva Garnet reviewed chart and recent hospitalization. No orders given.
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
## 2023-07-13 ENCOUNTER — Ambulatory Visit (HOSPITAL_COMMUNITY): Payer: Medicare Other | Admitting: Anesthesiology

## 2023-07-13 ENCOUNTER — Ambulatory Visit (HOSPITAL_BASED_OUTPATIENT_CLINIC_OR_DEPARTMENT_OTHER): Payer: Medicare Other | Admitting: Anesthesiology

## 2023-07-13 ENCOUNTER — Encounter (HOSPITAL_COMMUNITY)
Admission: RE | Disposition: A | Discharge status: Home / Self Care | Payer: Self-pay | Source: Home / Self Care | Attending: Ophthalmology

## 2023-07-13 ENCOUNTER — Ambulatory Visit (HOSPITAL_COMMUNITY)
Admission: RE | Admit: 2023-07-13 | Discharge: 2023-07-13 | Disposition: A | Discharge status: Home / Self Care | Payer: Medicare Other | Source: Home / Self Care | Attending: Ophthalmology | Admitting: Ophthalmology

## 2023-07-13 DIAGNOSIS — I4891 Unspecified atrial fibrillation: Secondary | ICD-10-CM | POA: Insufficient documentation

## 2023-07-13 DIAGNOSIS — Z951 Presence of aortocoronary bypass graft: Secondary | ICD-10-CM | POA: Insufficient documentation

## 2023-07-13 DIAGNOSIS — H25811 Combined forms of age-related cataract, right eye: Secondary | ICD-10-CM

## 2023-07-13 DIAGNOSIS — I350 Nonrheumatic aortic (valve) stenosis: Secondary | ICD-10-CM | POA: Insufficient documentation

## 2023-07-13 DIAGNOSIS — N183 Chronic kidney disease, stage 3 unspecified: Secondary | ICD-10-CM | POA: Diagnosis not present

## 2023-07-13 DIAGNOSIS — H02834 Dermatochalasis of left upper eyelid: Secondary | ICD-10-CM | POA: Insufficient documentation

## 2023-07-13 DIAGNOSIS — H17822 Peripheral opacity of cornea, left eye: Secondary | ICD-10-CM | POA: Insufficient documentation

## 2023-07-13 DIAGNOSIS — I130 Hypertensive heart and chronic kidney disease with heart failure and stage 1 through stage 4 chronic kidney disease, or unspecified chronic kidney disease: Secondary | ICD-10-CM

## 2023-07-13 DIAGNOSIS — I252 Old myocardial infarction: Secondary | ICD-10-CM | POA: Insufficient documentation

## 2023-07-13 DIAGNOSIS — H2181 Floppy iris syndrome: Secondary | ICD-10-CM | POA: Diagnosis not present

## 2023-07-13 DIAGNOSIS — I5033 Acute on chronic diastolic (congestive) heart failure: Secondary | ICD-10-CM | POA: Diagnosis not present

## 2023-07-13 DIAGNOSIS — H11153 Pinguecula, bilateral: Secondary | ICD-10-CM | POA: Diagnosis not present

## 2023-07-13 DIAGNOSIS — E1122 Type 2 diabetes mellitus with diabetic chronic kidney disease: Secondary | ICD-10-CM | POA: Insufficient documentation

## 2023-07-13 DIAGNOSIS — K219 Gastro-esophageal reflux disease without esophagitis: Secondary | ICD-10-CM | POA: Diagnosis not present

## 2023-07-13 DIAGNOSIS — G4730 Sleep apnea, unspecified: Secondary | ICD-10-CM | POA: Insufficient documentation

## 2023-07-13 DIAGNOSIS — I2510 Atherosclerotic heart disease of native coronary artery without angina pectoris: Secondary | ICD-10-CM | POA: Insufficient documentation

## 2023-07-13 DIAGNOSIS — H0100A Unspecified blepharitis right eye, upper and lower eyelids: Secondary | ICD-10-CM | POA: Diagnosis not present

## 2023-07-13 DIAGNOSIS — N189 Chronic kidney disease, unspecified: Secondary | ICD-10-CM | POA: Diagnosis not present

## 2023-07-13 DIAGNOSIS — H02831 Dermatochalasis of right upper eyelid: Secondary | ICD-10-CM | POA: Insufficient documentation

## 2023-07-13 DIAGNOSIS — E1136 Type 2 diabetes mellitus with diabetic cataract: Secondary | ICD-10-CM | POA: Insufficient documentation

## 2023-07-13 DIAGNOSIS — Z7984 Long term (current) use of oral hypoglycemic drugs: Secondary | ICD-10-CM | POA: Insufficient documentation

## 2023-07-13 DIAGNOSIS — I509 Heart failure, unspecified: Secondary | ICD-10-CM | POA: Insufficient documentation

## 2023-07-13 DIAGNOSIS — H52223 Regular astigmatism, bilateral: Secondary | ICD-10-CM | POA: Insufficient documentation

## 2023-07-13 DIAGNOSIS — H0100B Unspecified blepharitis left eye, upper and lower eyelids: Secondary | ICD-10-CM | POA: Insufficient documentation

## 2023-07-13 HISTORY — PX: CATARACT EXTRACTION W/PHACO: SHX586

## 2023-07-13 LAB — GLUCOSE, CAPILLARY: Glucose-Capillary: 198 mg/dL — ABNORMAL HIGH (ref 70–99)

## 2023-07-13 SURGERY — PHACOEMULSIFICATION, CATARACT, WITH IOL INSERTION
Anesthesia: Monitor Anesthesia Care | Site: Eye | Laterality: Right

## 2023-07-13 MED ORDER — DEXAMETHASONE 0.4 MG OP INST
VAGINAL_INSERT | OPHTHALMIC | Status: DC | PRN
  Administered 2023-07-13: .4 mg via OPHTHALMIC

## 2023-07-13 MED ORDER — DEXAMETHASONE 0.4 MG OP INST
VAGINAL_INSERT | OPHTHALMIC | Status: AC
  Filled 2023-07-13: qty 1

## 2023-07-13 MED ORDER — POVIDONE-IODINE 5 % OP SOLN
OPHTHALMIC | Status: DC | PRN
  Administered 2023-07-13: 1 via OPHTHALMIC

## 2023-07-13 MED ORDER — SODIUM HYALURONATE 10 MG/ML IO SOLUTION
PREFILLED_SYRINGE | INTRAOCULAR | Status: DC | PRN
  Administered 2023-07-13: .85 mL via INTRAOCULAR

## 2023-07-13 MED ORDER — MIDAZOLAM HCL 2 MG/2ML IJ SOLN
INTRAMUSCULAR | Status: DC | PRN
  Administered 2023-07-13: 1 mg via INTRAVENOUS

## 2023-07-13 MED ORDER — LACTATED RINGERS IV SOLN: INTRAVENOUS | Status: DC

## 2023-07-13 MED ORDER — STERILE WATER FOR IRRIGATION IR SOLN
Status: DC | PRN
  Administered 2023-07-13: 1

## 2023-07-13 MED ORDER — EPINEPHRINE PF 1 MG/ML IJ SOLN
INTRAMUSCULAR | Status: AC
  Filled 2023-07-13: qty 1

## 2023-07-13 MED ORDER — PHENYLEPHRINE-KETOROLAC 1-0.3 % IO SOLN
INTRAOCULAR | Status: DC | PRN
  Administered 2023-07-13: 500 mL via OPHTHALMIC

## 2023-07-13 MED ORDER — LIDOCAINE HCL (PF) 1 % IJ SOLN
INTRAOCULAR | Status: DC | PRN
  Administered 2023-07-13: 1 mL via OPHTHALMIC

## 2023-07-13 MED ORDER — TETRACAINE HCL 0.5 % OP SOLN
1.0000 [drp] | OPHTHALMIC | Status: AC | PRN
  Administered 2023-07-13 (×3): 1 [drp] via OPHTHALMIC

## 2023-07-13 MED ORDER — EPINEPHRINE PF 1 MG/ML IJ SOLN
INTRAMUSCULAR | Status: AC
  Filled 2023-07-13: qty 2

## 2023-07-13 MED ORDER — PHENYLEPHRINE-KETOROLAC 1-0.3 % IO SOLN
INTRAOCULAR | Status: AC
  Filled 2023-07-13: qty 4

## 2023-07-13 MED ORDER — MIDAZOLAM HCL 2 MG/2ML IJ SOLN
INTRAMUSCULAR | Status: AC
  Filled 2023-07-13: qty 2

## 2023-07-13 MED ORDER — LIDOCAINE HCL 3.5 % OP GEL
1.0000 | Freq: Once | OPHTHALMIC | Status: AC
  Administered 2023-07-13: 1 via OPHTHALMIC

## 2023-07-13 MED ORDER — SODIUM HYALURONATE 23MG/ML IO SOSY
PREFILLED_SYRINGE | INTRAOCULAR | Status: DC | PRN
  Administered 2023-07-13: .6 mL via INTRAOCULAR

## 2023-07-13 MED ORDER — SODIUM CHLORIDE 0.9% FLUSH
INTRAVENOUS | Status: DC | PRN
  Administered 2023-07-13: 3 mL via INTRAVENOUS

## 2023-07-13 MED ORDER — BSS IO SOLN
INTRAOCULAR | Status: DC | PRN
  Administered 2023-07-13: 15 mL via INTRAOCULAR

## 2023-07-13 MED ORDER — TROPICAMIDE 1 % OP SOLN
1.0000 [drp] | OPHTHALMIC | Status: AC | PRN
  Administered 2023-07-13 (×3): 1 [drp] via OPHTHALMIC

## 2023-07-13 MED ORDER — PHENYLEPHRINE HCL 2.5 % OP SOLN
1.0000 [drp] | OPHTHALMIC | Status: AC | PRN
  Administered 2023-07-13 (×3): 1 [drp] via OPHTHALMIC

## 2023-07-13 SURGICAL SUPPLY — 14 items
CATARACT SUITE SIGHTPATH (MISCELLANEOUS) ×1 IMPLANT
CLOTH BEACON ORANGE TIMEOUT ST (SAFETY) ×1 IMPLANT
EYE SHIELD UNIVERSAL CLEAR (GAUZE/BANDAGES/DRESSINGS) IMPLANT
FEE CATARACT SUITE SIGHTPATH (MISCELLANEOUS) ×1 IMPLANT
GLOVE BIOGEL PI IND STRL 7.0 (GLOVE) ×2 IMPLANT
LENS IOL TECNIS EYHANCE 20.0 (Intraocular Lens) IMPLANT
NDL HYPO 18GX1.5 BLUNT FILL (NEEDLE) ×1 IMPLANT
NEEDLE HYPO 18GX1.5 BLUNT FILL (NEEDLE) ×1 IMPLANT
PAD ARMBOARD 7.5X6 YLW CONV (MISCELLANEOUS) ×1 IMPLANT
POSITIONER HEAD 8X9X4 ADT (SOFTGOODS) ×1 IMPLANT
RING MALYGIN 7.0 (MISCELLANEOUS) IMPLANT
SYR TB 1ML LL NO SAFETY (SYRINGE) ×1 IMPLANT
TAPE SURG TRANSPORE 1 IN (GAUZE/BANDAGES/DRESSINGS) IMPLANT
WATER STERILE IRR 250ML POUR (IV SOLUTION) ×1 IMPLANT

## 2023-07-13 NOTE — Anesthesia Postprocedure Evaluation (Signed)
Anesthesia Post Note  Patient: Alejandro [name]  Procedure(s) Performed: CATARACT EXTRACTION PHACO AND INTRAOCULAR LENS PLACEMENT (IOC) WITH PLACEMENT OF CORTICOSTEROID (Right: Eye)  Patient location during evaluation: Phase II Anesthesia Type: MAC Level of consciousness: awake and alert and oriented Pain management: pain level controlled Vital Signs Assessment: post-procedure vital signs reviewed and stable Respiratory status: spontaneous breathing, nonlabored ventilation and respiratory function stable Cardiovascular status: stable and blood pressure returned to baseline Postop Assessment: no apparent nausea or vomiting Anesthetic complications: no  No notable events documented.   Last Vitals:  Vitals:   07/13/23 0815 07/13/23 0941  BP: (!) 113/56 107/85  Pulse: 79 82  Resp: 20 17  Temp: 36.8 C 36.5 C  SpO2: 100% 94%    Last Pain:  Vitals:   07/13/23 0941  TempSrc: Oral  PainSc: 0-No pain                  C 

## 2023-07-13 NOTE — Anesthesia Procedure Notes (Signed)
Date/Time: 07/13/2023 9:13 AM  Performed by: Franco Nones, CRNAPre-anesthesia Checklist: Patient identified, Emergency Drugs available, Suction available, Timeout performed and Patient being monitored Patient Re-evaluated:Patient Re-evaluated prior to induction Oxygen Delivery Method: Nasal Cannula

## 2023-07-13 NOTE — Op Note (Signed)
Date of procedure: 07/13/23  Pre-operative diagnosis:   1.Visually significant combined form age-related cataract, Right Eye (H25.811) 2. Poor dilation of the iris, right eye  Post-operative diagnosis:   1. Visually significant combined form age-related cataract, Right Eye (H25.811) 2. Pain and inflammation following cataract surgery Right Eye (H57.11) 3. Intra-operative floppy iris syndrome, right eye  Procedure:  Removal of cataract via phacoemulsification and insertion of intra-ocular lens [employer] DIB00 +20.0D into the capsular bag of the Right Eye, Complex 2. Placement of Dextenza insert, Right Eye  Attending surgeon: Rudy Jew. , MD, MA  Anesthesia: MAC, Topical Akten  Complications: None  Estimated Blood Loss: <76mL (minimal)  Specimens: None  Implants: As above  Indications:  Visually significant age-related cataract, Right Eye  Procedure:  The patient was seen and identified in the pre-operative area. The operative eye was identified and dilated.  The operative eye was marked.  Topical anesthesia was administered to the operative eye.     The patient was then to the operative suite and placed in the supine position.  A timeout was performed confirming the patient, procedure to be performed, and all other relevant information.   The patient's face was prepped and draped in the usual fashion for intra-ocular surgery.  A lid speculum was placed into the operative eye and the surgical microscope moved into place and focused.  A superotemporal paracentesis was created using a 20 gauge paracentesis blade.  Shugarcaine was injected into the anterior chamber. A floppy iris was noted.  Viscoelastic was injected into the anterior chamber.  A temporal clear-corneal main wound incision was created using a 2.34mm microkeratome. A 7.17mm Malyugin ring was placed. A continuous curvilinear capsulorrhexis was initiated using an irrigating cystitome and completed using  capsulorrhexis forceps.  Hydrodissection and hydrodeliniation were performed.  Viscoelastic was injected into the anterior chamber.  A phacoemulsification handpiece and a chopper as a second instrument were used to remove the nucleus and epinucleus. The irrigation/aspiration handpiece was used to remove any remaining cortical material.   The capsular bag was reinflated with viscoelastic, checked, and found to be intact.  The intraocular lens was inserted into the capsular bag. The Malyugin ring was removed.The irrigation/aspiration handpiece was used to remove any remaining viscoelastic.  The clear corneal wound and paracentesis wounds were then hydrated and checked with Weck-Cels to be watertight.   The lid-speculum was removed. The lower punctum was dilated. A Dextenza implant was placed in the lower canaliculus without complication.  The drape was removed.  The patient's face was cleaned with a wet and dry 4x4. A clear shield was taped over the eye. The patient was taken to the post-operative care unit in good condition, having tolerated the procedure well.  Post-Op Instructions: The patient will follow up at [facility] for a same day post-operative evaluation and will receive all other orders and instructions.

## 2023-07-13 NOTE — Anesthesia Preprocedure Evaluation (Signed)
Anesthesia Evaluation  Patient identified by MRN, date of birth, ID band Patient awake    Reviewed: Allergy & Precautions, H&P , NPO status , Patient's Chart, lab work & pertinent test results  Airway Mallampati: III  TM Distance: >3 FB Neck ROM: Full    Dental  (+) Missing, Poor Dentition, Chipped, Dental Advisory Given   Pulmonary shortness of breath, with exertion and Long-Term Oxygen Therapy, sleep apnea, Continuous Positive Airway Pressure Ventilation and Oxygen sleep apnea    Pulmonary exam normal breath sounds clear to auscultation       Cardiovascular Exercise Tolerance: Poor hypertension, Pt. on medications + CAD, + Past MI, + CABG, +CHF and + DOE  + dysrhythmias Atrial Fibrillation + Valvular Problems/Murmurs AS  Rhythm:Irregular Rate:Normal + Systolic murmurs 1. Left ventricular ejection fraction, by estimation, is 45 to 50%. The  left ventricle has mildly decreased function. The left ventricle  demonstrates regional wall motion abnormalities (see scoring  diagram/findings for description). The left ventricular   internal cavity size was moderately dilated. Left ventricular diastolic  parameters are consistent with Grade II diastolic dysfunction  (pseudonormalization).   2. Right ventricular systolic function is normal. The right ventricular  size is mildly enlarged. There is mildly elevated pulmonary artery  systolic pressure.   3. Left atrial size was severely dilated.   4. Right atrial size was mildly dilated.   5. The mitral valve is normal in structure. No evidence of mitral valve  regurgitation. No evidence of mitral stenosis.   6. The aortic valve is calcified. There is moderate calcification of the  aortic valve. There is moderate thickening of the aortic valve. Aortic  valve regurgitation is not visualized. Moderate aortic valve stenosis.  Aortic valve mean gradient measures  19.0 mmHg. Aortic valve Vmax  measures 2.97 m/s.   7. Aortic dilatation noted. There is mild dilatation of the aortic root,  measuring 43 mm.   8. The inferior vena cava is normal in size with greater than 50%  respiratory variability, suggesting right atrial pressure of 3 mmHg.      Neuro/Psych  Headaches  negative psych ROS   GI/Hepatic Neg liver ROS,GERD  Medicated and Controlled,,  Endo/Other  diabetes, Well Controlled, Type 2    Renal/GU Renal disease  negative genitourinary   Musculoskeletal negative musculoskeletal ROS (+)    Abdominal   Peds negative pediatric ROS (+)  Hematology negative hematology ROS (+)   Anesthesia Other Findings   Reproductive/Obstetrics negative OB ROS                             Anesthesia Physical Anesthesia Plan  ASA: 4  Anesthesia Plan: MAC   Post-op Pain Management: Minimal or no pain anticipated   Induction:   PONV Risk Score and Plan: 0 and Treatment may vary due to age or medical condition  Airway Management Planned: Nasal Cannula and Natural Airway  Additional Equipment:   Intra-op Plan:   Post-operative Plan:   Informed Consent: I have reviewed the patients History and Physical, chart, labs and discussed the procedure including the risks, benefits and alternatives for the proposed anesthesia with the patient or authorized representative who has indicated his/her understanding and acceptance.     Dental advisory given  Plan Discussed with: CRNA and Surgeon  Anesthesia Plan Comments:        Anesthesia Quick Evaluation

## 2023-07-13 NOTE — Transfer of Care (Signed)
Immediate Anesthesia Transfer of Care Note  Patient: NICKOLAOS BOGARD  Procedure(s) Performed: CATARACT EXTRACTION PHACO AND INTRAOCULAR LENS PLACEMENT (IOC) WITH PLACEMENT OF CORTICOSTEROID (Right: Eye)  Patient Location: Short Stay  Anesthesia Type:MAC  Level of Consciousness: awake and alert   Airway & Oxygen Therapy: Patient Spontanous Breathing  Post-op Assessment: Report given to RN and Post -op Vital signs reviewed and stable  Post vital signs: Reviewed and stable  Last Vitals:  Vitals Value Taken Time  BP 107/85 07/13/23 0941  Temp 36.5 C 07/13/23 0941  Pulse 82 07/13/23 0941  Resp 17 07/13/23 0941  SpO2 94 % 07/13/23 0941    Last Pain:  Vitals:   07/13/23 0941  TempSrc: Oral  PainSc: 0-No pain      Patients Stated Pain Goal: 5 (07/13/23 0815)  Complications: No notable events documented.

## 2023-07-13 NOTE — Discharge Instructions (Addendum)
Please discharge patient when stable, will follow up today with Dr. Wrzosek at the [facility] office immediately following discharge.  Leave shield in place until visit.  All paperwork with discharge instructions will be given at the office.  [facility] Address:  [address]  [place], [place] [address]  

## 2023-07-13 NOTE — Interval H&P Note (Signed)
History and Physical Interval Note:  07/13/2023 9:03 AM  Alejandro [name]  has presented today for surgery, with the diagnosis of combined forms age related cataract, right eye.  The various methods of treatment have been discussed with the patient and family. After consideration of risks, benefits and other options for treatment, the patient has consented to  Procedure(s): CATARACT EXTRACTION PHACO AND INTRAOCULAR LENS PLACEMENT (IOC) (Right) as a surgical intervention.  The patient's history has been reviewed, patient examined, no change in status, stable for surgery.  I have reviewed the patient's chart and labs.  Questions were answered to the patient's satisfaction.     Fabio Pierce

## 2023-07-15 ENCOUNTER — Ambulatory Visit: Payer: Medicare Other | Admitting: Hematology

## 2023-07-15 ENCOUNTER — Other Ambulatory Visit: Payer: Medicare Other

## 2023-07-15 ENCOUNTER — Encounter (HOSPITAL_COMMUNITY): Payer: Self-pay | Admitting: Ophthalmology

## 2023-07-18 ENCOUNTER — Other Ambulatory Visit: Payer: Self-pay

## 2023-07-19 NOTE — Progress Notes (Signed)
[facility] [address] [place] [address]    Clinic Day:  07/20/2023  Referring physician: Roe Rutherford, NP  Patient Care Team: Roe Rutherford, NP as PCP - General (Adult Health Nurse Practitioner) Jonelle Sidle, MD as PCP - Cardiology (Cardiology) Doreatha Massed, MD as Medical Oncologist (Medical Oncology) Therese Sarah, RN as Oncology Nurse Navigator (Medical Oncology)   ASSESSMENT & PLAN:   Assessment: 1.  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  No B symptoms. - Reports hoarseness of voice for the last 1 month with occasional choking to liquids. - CT chest (09/03/2022): Interval enlargement of matted appearing pretracheal and right paratracheal lymph node measuring 2.7 x 2.7 cm, previously 1.9 x 1.6 cm (08/23/2021), mild pulmonary fibrosis. - PET/CT scan (09/11/2022): Mildly metabolically lower cervical lymph nodes bilaterally, numerous on the right with SUV 3.7.  Nodule in the deep lobe of the right parotid gland, 1.4 x 1.0 SUV 10.2.  Asymmetric hypermetabolic activity in the right vocal cord, SUV 8.7 with no focal mass.  Right paratracheal mass 3.7 x 2.8 cm SUV 10.6.  10 mm left paratracheal node with SUV 5.5.  Left para-aortic adenopathy 4.5 x 2.4 cm, SUV 12.4.  Additional smaller retroperitoneal and mesenteric lymph nodes mildly hypermetabolic.  10 mm left inguinal node with SUV 4.3.  Hypermetabolic activity along the medial aspect of the left femoral neck SUV 6.8.  No clear bone lesion on the CT images. - Retroperitoneal lymph node biopsy (10/10/2022): Atypical lymphoid proliferation, but concerning for lymphoproliferative disorder. - ENT evaluation (Dr. Pollyann Kennedy) fiberoptic laryngoscopy: Left vocal cord paralysis - Bronchoscopy and biopsy of 4R lymph node (11/17/2022): Suspicious for B-cell lymphoma. - Left inguinal excisional biopsy on 12/15/2022 - Pathology: High-grade  follicular lymphoma, grade 3 - 3 cycles of BR from 01/07/2023 through 03/04/2023 - PET scan on 04/09/2023: Deauville 2 response. - Chemotherapy was held due to severe weakness.   2.  Social/family history: - He lives at home by himself and is independent of all ADLs and IADLs. 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  Non-smoker.  Has secondhand smoke exposure from his wife. - No family history of malignancies.    Plan: 1.  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 - Denies any fevers, night sweats or weight loss in the last 3 months.  He was recently hospitalized with CHF exacerbation and has been on oxygen since then. - Reviewed labs today: Normal LFTs and creatinine.  LDH minimally elevated at 215.  CBC shows grossly normal with mild anemia. - Recommend follow-up in 4 months.  Will repeat CT CAP with contrast prior to next visit.   2.  Normocytic anemia: - Hemoglobin is 11.7.  He is taking iron tablet most days of the week and has dark stools when he takes iron. - Will check ferritin and iron panel at next visit.     Orders Placed This Encounter  Procedures   CT CHEST ABDOMEN PELVIS W CONTRAST    Standing Status:   Future    Standing Expiration Date:   07/19/2024    Order Specific Question:   If indicated for the ordered procedure, I authorize the administration of contrast media per Radiology protocol    Answer:   Yes    Order Specific Question:  Does the patient have a contrast media/X-ray dye allergy?    Answer:   No    Order Specific Question:   Preferred imaging location?    Answer:   [facility]    Order Specific Question:   Release to patient    Answer:   Immediate    Order Specific Question:   If indicated for the ordered procedure, I authorize the administration of oral contrast media per Radiology protocol    Answer:   Yes    CBC with Differential/Platelet    Standing Status:   Future    Standing Expiration Date:   07/19/2024    Order Specific Question:   Release to patient    Answer:   Immediate   Comprehensive metabolic panel    Standing Status:   Future    Standing Expiration Date:   07/19/2024    Order Specific Question:   Release to patient    Answer:   Immediate   Lactate dehydrogenase    Standing Status:   Future    Standing Expiration Date:   07/19/2024    Order Specific Question:   Release to patient    Answer:   Immediate   Ferritin    Standing Status:   Future    Standing Expiration Date:   07/19/2024    Order Specific Question:   Release to patient    Answer:   Immediate   Iron and TIBC    Standing Status:   Future    Standing Expiration Date:   07/19/2024    Order Specific Question:   Release to patient    Answer:   Immediate      I,Helena R Teague,acting as a scribe for Doreatha Massed, MD.,have documented all relevant documentation on the behalf of Doreatha Massed, MD,as directed by  Doreatha Massed, MD while in the presence of Doreatha Massed, MD.  I, Doreatha Massed MD, have reviewed the above documentation for accuracy and completeness, and I agree with the above.    Doreatha Massed, MD   8/19/20244:06 PM  CHIEF COMPLAINT:   Diagnosis: 3A follicular lymphoma, stage III    Cancer Staging  Follicular lymphoma grade 3a (HCC) Staging form: Hodgkin and Non-Hodgkin Lymphoma, AJCC 8th Edition - Clinical stage from 12/23/2022: Stage III (Follicular lymphoma) - Signed by Doreatha Massed, MD on 12/23/2022    Prior Therapy: none  Current Therapy:  Bendamustine and rituximab    HISTORY OF PRESENT ILLNESS:   Oncology History  Follicular lymphoma grade 3a (HCC)  12/23/2022 Initial Diagnosis   Follicular lymphoma grade 3a (HCC)   12/23/2022 Cancer Staging   Staging form: Hodgkin and Non-Hodgkin Lymphoma, AJCC 8th Edition - Clinical stage from 12/23/2022:  Stage III (Follicular lymphoma) - Signed by Doreatha Massed, MD on 12/23/2022 Histopathologic type: Follicular lymphoma, grade 3 Stage prefix: Initial diagnosis   01/07/2023 -  Chemotherapy   Patient is on Treatment Plan : NON-HODGKINS LYMPHOMA Rituximab D1 + Bendamustine D1,2 q28d x 6 cycles        INTERVAL HISTORY:   Lenus is a 82 y.o. male presenting to clinic today for follow up of 3A follicular lymphoma, stage III. He was last seen by me on 04/14/23.  Since his last visit, he presented to the ED on 6/16 and 7/6 for SOB. On 6/16 chest X ray showed diffusely increased interstitial markings bilaterally favoring pulmonary edema. He was given Duoneb and IV Lasix before discharge on 6/17 when his symptoms improved. On 7/6 jis O2 tanks  at home were empty and he stabilized once given 4 L of O2.   He was admitted to the ED on 7/29 for acute CHF and acute exacerbation of CHF. He was given IV Lasix overnight with improvement in his clinical condition after diuresis.   He also underwent cataract extractions and intraocular lens placement with corticosteroid on 07/13/23 of the right eye and has the left eye scheduled for 07/27/23 with Dr. June Leap.   Today, he states that he is doing well overall. His appetite level is at 90%. His energy level is at 10%.  PAST MEDICAL HISTORY:   Past Medical History: Past Medical History:  Diagnosis Date   A-fib (HCC)    AAA Ultrasound 01/11/2020    AAA [place] 01/11/20:  no abdominal aortic aneurysm, 2.4 cm   Aortic insufficiency    a. mild-mod by intraop TEE 05/2018 (no aortic stenosis) // Echo 2/21: mild //  Echo 9/22: trivial   Aortic stenosis    Echo 9/22: mild (mean 16) // Echo 2/21: mild (mean 18.7)   CAD (coronary artery disease)    a. NSTEMI 05/2018 with multivessel disease -> s/p CABGx4 05/11/18 // Myoview 06/07/20: inf scar w mild peri-infarct ischemia, EF 37; Int Risk   Cancer (HCC)    Carotid artery disease (HCC)    PreCABG [place] 05/07/18: Bilat ICA 1-39    CKD (chronic kidney disease), stage III (HCC)    HFimpEF (heart failure with improved EF)    Echocardiogram 01/11/20: EF 45, Gr 3 DD, mild AI, mild AS (mean 18.7 mmHg) // Echocardiogram 08/14/21:  EF 55-60, inf HK, mild LVH, Gr 1 DD, normal RVSF, RVSP 25.3, mild LAE, trivial MR, MAC, trivial AI, mild AS (mean 16 mmHg, Vmax 283 cm/s, DI 0.41)   History of nephrolithiasis 2004   Hyperlipidemia    Hypertension    IBS (irritable colon syndrome)    Ischemic cardiomyopathy    Mitral regurgitation    Echo 9/22: trivial MR   Postoperative atrial fibrillation (HCC)    PVC's (premature ventricular contractions)    Reflux    Type 2 diabetes mellitus (HCC)     Surgical History: Past Surgical History:  Procedure Laterality Date   BRONCHIAL NEEDLE ASPIRATION BIOPSY  11/17/2022   Procedure: BRONCHIAL NEEDLE ASPIRATION BIOPSIES;  Surgeon: Leslye Peer, MD;  Location: [facility] ENDOSCOPY;  Service: Cardiopulmonary;;   CARDIOVERSION     CATARACT EXTRACTION W/PHACO Right 07/13/2023   Procedure: CATARACT EXTRACTION PHACO AND INTRAOCULAR LENS PLACEMENT (IOC) WITH PLACEMENT OF CORTICOSTEROID;  Surgeon: Fabio Pierce, MD;  Location: [facility] ORS;  Service: Ophthalmology;  Laterality: Right;  CDE 17.32   CORONARY ARTERY BYPASS GRAFT N/A 05/11/2018   Procedure: CORONARY ARTERY BYPASS GRAFTING (CABG), on pump, times four, using left internal mammary artery and endoscopically harvested right greater saphenous leg vein.;  Surgeon: Kerin Perna, MD;  Location: [facility] OR;  Service: Open Heart Surgery;  Laterality: N/A;   ESOPHAGOGASTRODUODENOSCOPY  02/04/2012   Procedure: ESOPHAGOGASTRODUODENOSCOPY (EGD);  Surgeon: Malissa Hippo, MD;  Location: [facility] ENDO SUITE;  Service: Endoscopy;  Laterality: N/A;  100   INGUINAL LYMPH NODE BIOPSY Left 12/15/2022   Procedure: INGUINAL LYMPH NODE BIOPSY;  Surgeon: Lewie Chamber, DO;  Location: [facility] ORS;  Service: General;  Laterality: Left;   LEFT HEART CATH AND CORONARY ANGIOGRAPHY N/A  05/04/2018   Procedure: LEFT HEART CATH AND CORONARY ANGIOGRAPHY;  Surgeon: Lyn Records, MD;  Location: [facility] INVASIVE CV LAB;  Service: Cardiovascular;  Laterality: N/A;  NASAL FRACTURE SURGERY     PORTACATH PLACEMENT Right 01/06/2023   Procedure: INSERTION PORT-A-CATH;  Surgeon: Lewie Chamber, DO;  Location: [facility] ORS;  Service: General;  Laterality: Right;   TEE WITHOUT CARDIOVERSION N/A 05/11/2018   Procedure: TRANSESOPHAGEAL ECHOCARDIOGRAM (TEE);  Surgeon: Donata Clay, Theron Arista, MD;  Location: [facility] OR;  Service: Open Heart Surgery;  Laterality: N/A;   VIDEO BRONCHOSCOPY WITH ENDOBRONCHIAL ULTRASOUND Right 11/17/2022   Procedure: VIDEO BRONCHOSCOPY WITH ENDOBRONCHIAL ULTRASOUND;  Surgeon: Leslye Peer, MD;  Location: [facility] ENDOSCOPY;  Service: Cardiopulmonary;  Laterality: Right;    Social History: Social History   Socioeconomic History   Marital status: Widowed    Spouse name: Not on file   Number of children: Not on file   Years of education: Not on file   Highest education level: Not on file  Occupational History   Not on file  Tobacco Use   Smoking status: Never   Smokeless tobacco: Never  Vaping Use   Vaping status: Never Used  Substance and Sexual Activity   Alcohol use: No    Alcohol/week: 0.0 standard drinks of alcohol   Drug use: No   Sexual activity: Yes  Other Topics Concern   Not on file  Social History Narrative   Not on file   Social Determinants of Health   Financial Resource Strain: Low Risk  (05/29/2023)   Received from [facility]   Overall Financial Resource Strain (CARDIA)    Difficulty of Paying Living Expenses: Not hard at all  Food Insecurity: No Food Insecurity (06/29/2023)   Hunger Vital Sign    Worried About Running Out of Food in the Last Year: Never true    Ran Out of Food in the Last Year: Never true  Transportation Needs: No Transportation Needs (06/29/2023)   PRAPARE - Administrator, Civil Service (Medical):  No    Lack of Transportation (Non-Medical): No  Physical Activity: Unknown (05/29/2023)   Received from [facility]   Exercise Vital Sign    Days of Exercise per Week: 0 days    Minutes of Exercise per Session: Not on file  Stress: Stress Concern Present (06/12/2023)   Received from [facility] - Occupational Stress Questionnaire    Feeling of Stress : To some extent  Social Connections: Socially Integrated (05/29/2023)   Received from [facility], [facility]   Social Network    How would you rate your social network (family, work, friends)?: Good participation with social networks  Intimate Partner Violence: Not At Risk (06/29/2023)   Humiliation, Afraid, Rape, and Kick questionnaire    Fear of Current or Ex-Partner: No    Emotionally Abused: No    Physically Abused: No    Sexually Abused: No    Family History: Family History  Problem Relation Age of Onset   Diabetes Mellitus II Mother    Heart attack Father    Heart attack Brother    Heart disease Other    Arthritis Other    Asthma Other    Diabetes Other     Current Medications:  Current Outpatient Medications:    acetaminophen (TYLENOL) 500 MG tablet, Take 1,000 mg by mouth every 6 (six) hours as needed for moderate pain or headache., Disp: , Rfl:    albuterol (ACCUNEB) 1.25 MG/3ML nebulizer solution, Take 1 ampule by nebulization every 6 (six) hours as needed for wheezing or shortness of breath., Disp: ,  Rfl:    albuterol (VENTOLIN HFA) 108 (90 Base) MCG/ACT inhaler, Inhale 2 puffs into the lungs every 6 (six) hours as needed for wheezing or shortness of breath., Disp: , Rfl:    allopurinol (ZYLOPRIM) 300 MG tablet, Take 1 tablet (300 mg total) by mouth daily., Disp: 30 tablet, Rfl: 3   apixaban (ELIQUIS) 5 MG TABS tablet, Take 1 tablet (5 mg total) by mouth 2 (two) times daily., Disp: 60 tablet, Rfl: 5   atorvastatin (LIPITOR) 40 MG tablet, Take 40 mg by  mouth at bedtime., Disp: , Rfl:    bisacodyl (DULCOLAX) 5 MG EC tablet, Take 5 mg by mouth daily as needed for moderate constipation., Disp: , Rfl:    cyanocobalamin (VITAMIN B12) 1000 MCG tablet, Take 1,000 mcg by mouth daily., Disp: , Rfl:    cycloSPORINE (RESTASIS) 0.05 % ophthalmic emulsion, Place 1 drop into both eyes 2 (two) times daily., Disp: , Rfl:    docusate sodium (COLACE) 100 MG capsule, Take 300 mg by mouth daily as needed for mild constipation., Disp: , Rfl:    famotidine (PEPCID) 20 MG tablet, Take 20 mg by mouth 2 (two) times daily., Disp: , Rfl:    fluticasone (FLONASE) 50 MCG/ACT nasal spray, Place 1 spray into both nostrils daily., Disp: 11.1 mL, Rfl: 2   furosemide (LASIX) 20 MG tablet, Take 3 tablets (60 mg total) by mouth daily., Disp: 90 tablet, Rfl: 11   glipiZIDE (GLUCOTROL XL) 10 MG 24 hr tablet, Take 10 mg by mouth daily., Disp: , Rfl:    loratadine (CLARITIN) 10 MG tablet, Take 10 mg by mouth daily., Disp: , Rfl:    Melatonin 10 MG CAPS, Take 30 mg by mouth at bedtime as needed (sleep)., Disp: , Rfl:    metFORMIN (GLUCOPHAGE-XR) 750 MG 24 hr tablet, Take 1 tablet by mouth 2 (two) times daily with a meal., Disp: , Rfl:    pantoprazole (PROTONIX) 40 MG tablet, Take 40 mg by mouth daily., Disp: , Rfl:    potassium chloride (MICRO-K) 10 MEQ CR capsule, Take 10 mEq by mouth daily., Disp: , Rfl:    sacubitril-valsartan (ENTRESTO) 49-51 MG, Take 1 tablet by mouth 2 (two) times daily., Disp: 60 tablet, Rfl: 0 No current facility-administered medications for this visit.  Facility-Administered Medications Ordered in Other Visits:    sodium chloride flush (NS) 0.9 % injection 10 mL, 10 mL, Intracatheter, PRN, Doreatha Massed, MD, 10 mL at 01/07/23 1446   Allergies: No Known Allergies  REVIEW OF SYSTEMS:   Review of Systems  Constitutional:  Negative for chills, fatigue and fever.  HENT:   Negative for lump/mass, mouth sores, nosebleeds, sore throat and trouble  swallowing.   Eyes:  Negative for eye problems.  Respiratory:  Positive for shortness of breath. Negative for cough.   Cardiovascular:  Negative for chest pain, leg swelling and palpitations.  Gastrointestinal:  Negative for abdominal pain, constipation, diarrhea, nausea and vomiting.  Genitourinary:  Negative for bladder incontinence, difficulty urinating, dysuria, frequency, hematuria and nocturia.   Musculoskeletal:  Negative for arthralgias, back pain, flank pain, myalgias and neck pain.  Skin:  Negative for itching and rash.  Neurological:  Negative for dizziness, headaches and numbness.  Hematological:  Does not bruise/bleed easily.  Psychiatric/Behavioral:  Negative for depression, sleep disturbance and suicidal ideas. The patient is not nervous/anxious.   All other systems reviewed and are negative.    VITALS:   There were no vitals taken for this visit.  Wt Readings  from Last 3 Encounters:  06/30/23 221 lb 1.9 oz (100.3 kg)  06/16/23 230 lb 2.6 oz (104.4 kg)  05/17/23 230 lb 2.6 oz (104.4 kg)    There is no height or weight on file to calculate BMI.  Performance status (ECOG): 2 - Symptomatic, <50% confined to bed  PHYSICAL EXAM:   Physical Exam Vitals and nursing note reviewed. Exam conducted with a chaperone present.  Constitutional:      Appearance: Normal appearance.  Cardiovascular:     Rate and Rhythm: Normal rate and regular rhythm.     Pulses: Normal pulses.     Heart sounds: Normal heart sounds.  Pulmonary:     Effort: Pulmonary effort is normal.     Breath sounds: Normal breath sounds.  Abdominal:     Palpations: Abdomen is soft. There is no hepatomegaly, splenomegaly or mass.     Tenderness: There is no abdominal tenderness.  Musculoskeletal:     Right lower leg: No edema.     Left lower leg: No edema.  Lymphadenopathy:     Cervical: No cervical adenopathy.     Right cervical: No superficial, deep or posterior cervical adenopathy.    Left cervical:  No superficial, deep or posterior cervical adenopathy.     Upper Body:     Right upper body: No supraclavicular or axillary adenopathy.     Left upper body: No supraclavicular or axillary adenopathy.  Neurological:     General: No focal deficit present.     Mental Status: He is alert and oriented to person, place, and time.  Psychiatric:        Mood and Affect: Mood normal.        Behavior: Behavior normal.     LABS:      Latest Ref Rng & Units 07/20/2023    2:20 PM 06/29/2023   10:16 AM 06/06/2023   10:56 AM  CBC  WBC 4.0 - 10.5 K/uL 7.4  9.4  9.9   Hemoglobin 13.0 - 17.0 g/dL 40.9  81.1  91.4   Hematocrit 39.0 - 52.0 % 35.1  37.3  38.3   Platelets 150 - 400 K/uL 208  244  204       Latest Ref Rng & Units 07/20/2023    2:20 PM 06/30/2023    4:22 AM 06/29/2023   10:16 AM  CMP  Glucose 70 - 99 mg/dL 782  956  213   BUN 8 - 23 mg/dL 14  11  10    Creatinine 0.61 - 1.24 mg/dL 0.86  5.78  4.69   Sodium 135 - 145 mmol/L 135  136  132   Potassium 3.5 - 5.1 mmol/L 3.1  3.3  3.3   Chloride 98 - 111 mmol/L 100  101  101   CO2 22 - 32 mmol/L 26  26  25    Calcium 8.9 - 10.3 mg/dL 8.5  8.4  8.4   Total Protein 6.5 - 8.1 g/dL 6.8     Total Bilirubin 0.3 - 1.2 mg/dL 0.7     Alkaline Phos 38 - 126 U/L 77     AST 15 - 41 U/L 17     ALT 0 - 44 U/L 14        No results found for: "CEA1", "CEA" / No results found for: "CEA1", "CEA" No results found for: "PSA1" No results found for: "GEX528" No results found for: "CAN125"  No results found for: "TOTALPROTELP", "ALBUMINELP", "A1GS", "A2GS", "BETS", "BETA2SER", "GAMS", "MSPIKE", "SPEI"  Lab Results  Component Value Date   TIBC 361 02/04/2023   FERRITIN 47 02/04/2023   IRONPCTSAT 13 (L) 02/04/2023   Lab Results  Component Value Date   LDH 215 (H) 07/20/2023   LDH 146 04/14/2023   LDH 129 04/01/2023     STUDIES:   DG Chest Port 1 View  Result Date: 06/29/2023 CLINICAL DATA:  Shortness of breath EXAM: PORTABLE CHEST - 1 VIEW  COMPARISON:  06/06/2023 FINDINGS: Unchanged cardiomegaly and pulmonary vascular congestion. Unchanged left basilar airspace opacity. Postsurgical changes of CABG again seen. Right chest port unchanged in position. IMPRESSION: Unchanged cardiomegaly and pulmonary vascular congestion indicative of heart failure/fluid volume overload. Unchanged left basilar opacity, likely due to atelectasis. Electronically Signed   By: Acquanetta Belling M.D.   On: 06/29/2023 11:28

## 2023-07-20 ENCOUNTER — Inpatient Hospital Stay: Payer: Medicare Other | Attending: Hematology

## 2023-07-20 ENCOUNTER — Inpatient Hospital Stay: Payer: Medicare Other | Admitting: Hematology

## 2023-07-20 DIAGNOSIS — C8238 Follicular lymphoma grade IIIa, lymph nodes of multiple sites: Secondary | ICD-10-CM

## 2023-07-20 DIAGNOSIS — D649 Anemia, unspecified: Secondary | ICD-10-CM | POA: Diagnosis present

## 2023-07-20 DIAGNOSIS — C8230 Follicular lymphoma grade IIIa, unspecified site: Secondary | ICD-10-CM | POA: Insufficient documentation

## 2023-07-20 DIAGNOSIS — E611 Iron deficiency: Secondary | ICD-10-CM

## 2023-07-20 LAB — CBC WITH DIFFERENTIAL/PLATELET
Abs Immature Granulocytes: 0.08 10*3/uL — ABNORMAL HIGH (ref 0.00–0.07)
Basophils Absolute: 0 10*3/uL (ref 0.0–0.1)
Basophils Relative: 0 %
Eosinophils Absolute: 0.1 10*3/uL (ref 0.0–0.5)
Eosinophils Relative: 1 %
HCT: 35.1 % — ABNORMAL LOW (ref 39.0–52.0)
Hemoglobin: 11.7 g/dL — ABNORMAL LOW (ref 13.0–17.0)
Immature Granulocytes: 1 %
Lymphocytes Relative: 21 %
Lymphs Abs: 1.5 10*3/uL (ref 0.7–4.0)
MCH: 29.7 pg (ref 26.0–34.0)
MCHC: 33.3 g/dL (ref 30.0–36.0)
MCV: 89.1 fL (ref 80.0–100.0)
Monocytes Absolute: 1.1 10*3/uL — ABNORMAL HIGH (ref 0.1–1.0)
Monocytes Relative: 14 %
Neutro Abs: 4.7 10*3/uL (ref 1.7–7.7)
Neutrophils Relative %: 63 %
Platelets: 208 10*3/uL (ref 150–400)
RBC: 3.94 MIL/uL — ABNORMAL LOW (ref 4.22–5.81)
RDW: 14.4 % (ref 11.5–15.5)
WBC: 7.4 10*3/uL (ref 4.0–10.5)
nRBC: 0 % (ref 0.0–0.2)

## 2023-07-20 LAB — COMPREHENSIVE METABOLIC PANEL
ALT: 14 U/L (ref 0–44)
AST: 17 U/L (ref 15–41)
Albumin: 3.7 g/dL (ref 3.5–5.0)
Alkaline Phosphatase: 77 U/L (ref 38–126)
Anion gap: 9 (ref 5–15)
BUN: 14 mg/dL (ref 8–23)
CO2: 26 mmol/L (ref 22–32)
Calcium: 8.5 mg/dL — ABNORMAL LOW (ref 8.9–10.3)
Chloride: 100 mmol/L (ref 98–111)
Creatinine, Ser: 0.84 mg/dL (ref 0.61–1.24)
GFR, Estimated: >60 mL/min (ref >60)
Glucose, Bld: 226 mg/dL — ABNORMAL HIGH (ref 70–99)
Potassium: 3.1 mmol/L — ABNORMAL LOW (ref 3.5–5.1)
Sodium: 135 mmol/L (ref 135–145)
Total Bilirubin: 0.7 mg/dL (ref 0.3–1.2)
Total Protein: 6.8 g/dL (ref 6.5–8.1)

## 2023-07-20 LAB — LACTATE DEHYDROGENASE: LDH: 215 U/L — ABNORMAL HIGH (ref 98–192)

## 2023-07-20 MED ORDER — HEPARIN SOD (PORK) LOCK FLUSH 100 UNIT/ML IV SOLN
500.0000 [IU] | Freq: Once | INTRAVENOUS | Status: AC
  Administered 2023-07-20: 500 [IU] via INTRAVENOUS

## 2023-07-20 MED ORDER — SODIUM CHLORIDE 0.9% FLUSH
10.0000 mL | Freq: Once | INTRAVENOUS | Status: AC
  Administered 2023-07-20: 10 mL via INTRAVENOUS

## 2023-07-20 NOTE — Patient Instructions (Addendum)
[facility]  Discharge Instructions  You were seen and examined today by Dr. Ellin Saba.  Dr. Ellin Saba discussed your most recent lab work and CT scan which revealed that everything looks stable except your hemoglobin and potassium is slightly low.  Dr. Ellin Saba is going to repeat CT of your chest, abdomen and pelvis.  Follow-up as scheduled.    Thank you for choosing [facility] to provide your oncology and hematology care.   To afford each patient quality time with our provider, please arrive at least 15 minutes before your scheduled appointment time. You may need to reschedule your appointment if you arrive late (10 or more minutes). Arriving late affects you and other patients whose appointments are after yours.  Also, if you miss three or more appointments without notifying the office, you may be dismissed from the clinic at the provider's discretion.    Again, thank you for choosing [facility].  Our hope is that these requests will decrease the amount of time that you wait before being seen by our physicians.   If you have a lab appointment with the Cancer Center - please note that after April 8th, all labs will be drawn in the cancer center.  You do not have to check in or register with the main entrance as you have in the past but will complete your check-in at the cancer center.            _____________________________________________________________  Should you have questions after your visit to [facility], please contact our office at [phone] and follow the prompts.  Our office hours are 8:00 a.m. to 4:30 p.m. Monday - Thursday and 8:00 a.m. to 2:30 p.m. Friday.  Please note that voicemails left after 4:00 p.m. may not be returned until the following business day.  We are closed weekends and all major holidays.  You do have access to a nurse 24-7, just call the main number to the clinic  [phone] and do not press any options, hold on the line and a nurse will answer the phone.    For prescription refill requests, have your pharmacy contact our office and allow 72 hours.    Masks are no longer required in the cancer centers. If you would like for your care team to wear a mask while they are taking care of you, please let them know. You may have one support person who is at least 80 years old accompany you for your appointments.

## 2023-07-20 NOTE — Progress Notes (Signed)
Patients port flushed without difficulty.  Good blood return noted with no bruising or swelling noted at site.  Band aid applied.  VSS with discharge and left in satisfactory condition with no s/s of distress noted.   

## 2023-07-21 ENCOUNTER — Other Ambulatory Visit: Payer: Self-pay

## 2023-07-22 ENCOUNTER — Encounter (HOSPITAL_COMMUNITY): Payer: Self-pay

## 2023-07-22 ENCOUNTER — Encounter (HOSPITAL_COMMUNITY)
Admission: RE | Admit: 2023-07-22 | Discharge: 2023-07-22 | Disposition: A | Discharge status: Home / Self Care | Payer: Medicare Other | Source: Ambulatory Visit | Attending: Ophthalmology | Admitting: Ophthalmology

## 2023-07-22 ENCOUNTER — Ambulatory Visit (HOSPITAL_COMMUNITY): Payer: Medicare Other | Admitting: Physical Therapy

## 2023-07-22 NOTE — H&P (Signed)
Surgical History & Physical  Patient Name: Alejandro [name]  DOB: 1941-03-20  Surgery: Cataract extraction with intraocular lens implant phacoemulsification; Left Eye Surgeon: Fabio Pierce MD Surgery Date: 07/27/2023 Pre-Op Date: 07/20/2023  HPI: A 22 Yr. old male patient is returning 6 days after cataract surgery. The right eye is affected. Since the last visit, the affected area feels improvement and is doing well. The patient's vision is improved and stable. Patient is following medication instructions. The patient experiences no eye pain. Patient elects to proceed with cataract sx in the left eye. The patient complains of difficulty when reading fine print, books, newspaper, instructions etc., which began for an unknown amount of time. The left eye is affected. The episode is gradual. The condition is worse with daily activities and with intensive visual activity (reading, computer). The complaint is associated with blurry vision, glare and halos. The patient experiences no eye pain. HPI was performed by Fabio Pierce .  Medical History: Cataracts diabetic retinopathy  Cancer Diabetes Heart Problem High Blood Pressure LDL Lung Problems allergies, GERD, chronic kidney disease  Review of Systems Allergic/Immunologic Seasonal Allergies Cardiovascular High Blood Pressure Endocrine diabetic All recorded systems are negative except as noted above.  Social Never smoked   Medication Ivizia, Prednisolone-Moxifloxacin-Bromfenac,  Albuterol, Eliquis, Atorvastatin, Flonase, Furosemide, Glipizide, Loratadine, Metformin, Oxycodone, Protonix, Compazine, Entresto, Spironolactone  Sx/Procedures Phaco c IOL OD with Dextenza,  Open Heart Sx x4  Drug Allergies  NKDA  History & Physical: Heent: cataract NECK: supple without bruits LUNGS: lungs clear to auscultation CV: regular rate and rhythm Abdomen: soft and non-tender  Impression & Plan: Assessment: 1.  CATARACT EXTRACTION STATUS;  Right Eye (Z98.41) 2.  NUCLEAR SCLEROSIS AGE RELATED; , Left Eye (H25.12) 3.  CORNEAL SCAR PERIPHERAL; Left Eye [phone])  Plan: 1.  1 week after cataract surgery. Doing well with improved vision and normal eye pressure. Call with any problems or concerns. Continue Pred-Moxi-Brom 2x/day for 3 more weeks.  2.  Left Eye. Cataract accounts for the patient's decreased vision. This visual impairment is not correctable with a tolerable change in glasses or contact lenses. Cataract surgery with an implantation of a new lens should significantly improve the visual and functional status of the patient. Discussed all risks, benefits, alternatives, and potential complications. Discussed the procedures and recovery. Patient desires to have surgery. A-scan ordered and performed today for intra-ocular lens calculations. The surgery will be performed in order to improve vision for driving, reading, and for eye examinations. Recommend phacoemulsification with intra-ocular lens. Recommend Dextenza for post-operative pain and inflammation. Surgery required to correct imbalance of vision. Dilates poorly - shugarcaine by protocol. Will want to operate superiorly due to corneal scarring. Malyugin Ring. Omidira.  3.  From remote trauma. Will need to perform cataract surgery superiorly to avoid the dense temporal scarring.

## 2023-07-24 ENCOUNTER — Ambulatory Visit (HOSPITAL_COMMUNITY): Payer: Medicare Other

## 2023-07-27 MED ORDER — PHENYLEPHRINE-KETOROLAC 1-0.3 % IO SOLN
INTRAOCULAR | Status: AC
  Filled 2023-07-27: qty 4

## 2023-08-05 ENCOUNTER — Encounter (HOSPITAL_COMMUNITY)
Admission: RE | Admit: 2023-08-05 | Discharge: 2023-08-05 | Disposition: A | Discharge status: Home / Self Care | Payer: Medicare Other | Source: Ambulatory Visit | Attending: Ophthalmology | Admitting: Ophthalmology

## 2023-08-06 ENCOUNTER — Encounter (HOSPITAL_COMMUNITY): Payer: Self-pay | Admitting: Anesthesiology

## 2023-08-07 ENCOUNTER — Emergency Department (HOSPITAL_COMMUNITY): Payer: Medicare Other

## 2023-08-07 ENCOUNTER — Encounter (HOSPITAL_COMMUNITY): Admission: RE | Payer: Self-pay | Source: Home / Self Care

## 2023-08-07 ENCOUNTER — Encounter (HOSPITAL_COMMUNITY): Payer: Self-pay | Admitting: *Deleted

## 2023-08-07 ENCOUNTER — Ambulatory Visit (HOSPITAL_COMMUNITY): Admission: RE | Admit: 2023-08-07 | Payer: Medicare Other | Source: Home / Self Care | Admitting: Ophthalmology

## 2023-08-07 ENCOUNTER — Other Ambulatory Visit: Payer: Self-pay

## 2023-08-07 ENCOUNTER — Emergency Department (HOSPITAL_COMMUNITY)
Admission: EM | Admit: 2023-08-07 | Discharge: 2023-08-07 | Disposition: A | Discharge status: Home / Self Care | Payer: Medicare Other | Attending: Emergency Medicine | Admitting: Emergency Medicine

## 2023-08-07 DIAGNOSIS — J8410 Pulmonary fibrosis, unspecified: Secondary | ICD-10-CM | POA: Diagnosis not present

## 2023-08-07 DIAGNOSIS — Z7984 Long term (current) use of oral hypoglycemic drugs: Secondary | ICD-10-CM | POA: Insufficient documentation

## 2023-08-07 DIAGNOSIS — Z7901 Long term (current) use of anticoagulants: Secondary | ICD-10-CM | POA: Insufficient documentation

## 2023-08-07 DIAGNOSIS — R0602 Shortness of breath: Secondary | ICD-10-CM | POA: Diagnosis present

## 2023-08-07 DIAGNOSIS — I129 Hypertensive chronic kidney disease with stage 1 through stage 4 chronic kidney disease, or unspecified chronic kidney disease: Secondary | ICD-10-CM | POA: Diagnosis not present

## 2023-08-07 DIAGNOSIS — I5020 Unspecified systolic (congestive) heart failure: Secondary | ICD-10-CM | POA: Diagnosis not present

## 2023-08-07 DIAGNOSIS — N183 Chronic kidney disease, stage 3 unspecified: Secondary | ICD-10-CM | POA: Insufficient documentation

## 2023-08-07 DIAGNOSIS — E1122 Type 2 diabetes mellitus with diabetic chronic kidney disease: Secondary | ICD-10-CM | POA: Insufficient documentation

## 2023-08-07 DIAGNOSIS — Z79899 Other long term (current) drug therapy: Secondary | ICD-10-CM | POA: Insufficient documentation

## 2023-08-07 DIAGNOSIS — Z20822 Contact with and (suspected) exposure to covid-19: Secondary | ICD-10-CM | POA: Diagnosis not present

## 2023-08-07 DIAGNOSIS — I2510 Atherosclerotic heart disease of native coronary artery without angina pectoris: Secondary | ICD-10-CM | POA: Insufficient documentation

## 2023-08-07 DIAGNOSIS — R0609 Other forms of dyspnea: Secondary | ICD-10-CM

## 2023-08-07 LAB — CBC WITH DIFFERENTIAL/PLATELET
Abs Immature Granulocytes: 0.07 10*3/uL (ref 0.00–0.07)
Basophils Absolute: 0 10*3/uL (ref 0.0–0.1)
Basophils Relative: 0 %
Eosinophils Absolute: 0 10*3/uL (ref 0.0–0.5)
Eosinophils Relative: 0 %
HCT: 35.9 % — ABNORMAL LOW (ref 39.0–52.0)
Hemoglobin: 11.6 g/dL — ABNORMAL LOW (ref 13.0–17.0)
Immature Granulocytes: 1 %
Lymphocytes Relative: 13 %
Lymphs Abs: 1 10*3/uL (ref 0.7–4.0)
MCH: 28.6 pg (ref 26.0–34.0)
MCHC: 32.3 g/dL (ref 30.0–36.0)
MCV: 88.4 fL (ref 80.0–100.0)
Monocytes Absolute: 0.9 10*3/uL (ref 0.1–1.0)
Monocytes Relative: 12 %
Neutro Abs: 5.9 10*3/uL (ref 1.7–7.7)
Neutrophils Relative %: 74 %
Platelets: 203 10*3/uL (ref 150–400)
RBC: 4.06 MIL/uL — ABNORMAL LOW (ref 4.22–5.81)
RDW: 14.6 % (ref 11.5–15.5)
WBC: 8 10*3/uL (ref 4.0–10.5)
nRBC: 0 % (ref 0.0–0.2)

## 2023-08-07 LAB — COMPREHENSIVE METABOLIC PANEL
ALT: 16 U/L (ref 0–44)
AST: 18 U/L (ref 15–41)
Albumin: 3.4 g/dL — ABNORMAL LOW (ref 3.5–5.0)
Alkaline Phosphatase: 74 U/L (ref 38–126)
Anion gap: 8 (ref 5–15)
BUN: 11 mg/dL (ref 8–23)
CO2: 28 mmol/L (ref 22–32)
Calcium: 8.5 mg/dL — ABNORMAL LOW (ref 8.9–10.3)
Chloride: 101 mmol/L (ref 98–111)
Creatinine, Ser: 0.86 mg/dL (ref 0.61–1.24)
GFR, Estimated: >60 mL/min (ref >60)
Glucose, Bld: 184 mg/dL — ABNORMAL HIGH (ref 70–99)
Potassium: 3.5 mmol/L (ref 3.5–5.1)
Sodium: 137 mmol/L (ref 135–145)
Total Bilirubin: 0.7 mg/dL (ref 0.3–1.2)
Total Protein: 6.7 g/dL (ref 6.5–8.1)

## 2023-08-07 LAB — BRAIN NATRIURETIC PEPTIDE: B Natriuretic Peptide: 450 pg/mL — ABNORMAL HIGH (ref 0.0–100.0)

## 2023-08-07 LAB — TROPONIN I (HIGH SENSITIVITY)
Troponin I (High Sensitivity): 12 ng/L (ref <18)
Troponin I (High Sensitivity): 11 ng/L (ref <18)

## 2023-08-07 LAB — SARS CORONAVIRUS 2 BY RT PCR: SARS Coronavirus 2 by RT PCR: NEGATIVE

## 2023-08-07 SURGERY — CATARACT EXTRACTION PHACO AND INTRAOCULAR LENS PLACEMENT (IOC) with placement of Corticosteroid
Anesthesia: Monitor Anesthesia Care | Laterality: Left

## 2023-08-07 MED ORDER — FUROSEMIDE 10 MG/ML IJ SOLN: 20.0000 mg | Freq: Once | INTRAMUSCULAR | Status: DC

## 2023-08-07 MED ORDER — IOHEXOL 350 MG/ML SOLN
75.0000 mL | Freq: Once | INTRAVENOUS | Status: AC | PRN
  Administered 2023-08-07: 75 mL via INTRAVENOUS

## 2023-08-07 MED ORDER — FUROSEMIDE 10 MG/ML IJ SOLN
40.0000 mg | Freq: Once | INTRAMUSCULAR | Status: AC
  Administered 2023-08-07: 40 mg via INTRAVENOUS
  Filled 2023-08-07: qty 4

## 2023-08-07 NOTE — Discharge Instructions (Addendum)
Thank you for coming to [facility] Emergency Department. You were seen for shortness of breath. We did an exam, labs, and imaging, and these showed small amount of fluid on the lungs consistent with your diagnosis of heart failure as well as likely worsening pulmonary fibrosis.  We gave you an extra dose of Lasix here in the ED to help get rid of some of the fluid.  I advised that you make an appointment to follow-up with a pulmonologist (lung doctor).  Your primary care doctor can refer you to a pulmonologist.  Please follow up with your primary care provider within 1 week.   Do not hesitate to return to the ED or call 911 if you experience: -Worsening symptoms -Chest pain -Lightheadedness, passing out -Fevers/chills -Anything else that concerns you

## 2023-08-07 NOTE — ED Notes (Signed)
Patient ambulated to nurses station and back spo2 93% on 3lpm 

## 2023-08-07 NOTE — ED Triage Notes (Signed)
Pt c/o feeling sob x 2 days and coughing up brown sputum; pt states he as been around people with covid but has not tested positive  Pt states he is on 3L of O2 at all times and has not had to increase

## 2023-08-07 NOTE — ED Provider Notes (Signed)
[place] EMERGENCY DEPARTMENT AT [facility] Provider Note   CSN: [id number] Arrival date & time: 08/07/23  3338     History  Chief Complaint  Patient presents with   Shortness of Breath    Alejandro [name] is a 82 y.o. male with medical history significant for stage III high-grade follicular lymphoma, CAD with prior CABG, moderate aortic stenosis, PAF on Eliquis, HFrEF, chronic hypoxemia, bradycardia, gout, and GERD  who presents with DOE x 2 days and coughing up brown sputum; pt states he as been around people with covid but has not tested positive.  He denies any chest pain, abdominal pain, nausea vomiting diarrhea constipation, urinary symptoms, leg swelling.  Takes Eliquis for A-fib.  Has no history of DVT or PE.  He was hospitalized from 06/29/2023 to 06/30/2023 for shortness of breath due to acute on chronic HFrEF/pulmonary hypertension and was given IV Lasix with improvement of clinical condition.  Lasix was increased at that time from 40 mg daily to 60 mg daily. He does have lymphoma and is not currently on chemotherapy.  Pt states he is on 3L of O2 at all times and has not had to increase it at home.    Past Medical History:  Diagnosis Date   A-fib [facility])    AAA Ultrasound 01/11/2020    AAA [place] 01/11/20:  no abdominal aortic aneurysm, 2.4 cm   Aortic insufficiency    a. mild-mod by intraop TEE 05/2018 (no aortic stenosis) // Echo 2/21: mild //  Echo 9/22: trivial   Aortic stenosis    Echo 9/22: mild (mean 16) // Echo 2/21: mild (mean 18.7)   CAD (coronary artery disease)    a. NSTEMI 05/2018 with multivessel disease -> s/p CABGx4 05/11/18 // Myoview 06/07/20: inf scar w mild peri-infarct ischemia, EF 37; Int Risk   Cancer (HCC)    Carotid artery disease (HCC)    PreCABG [place] 05/07/18: Bilat ICA 1-39   CKD (chronic kidney disease), stage III (HCC)    HFimpEF (heart failure with improved EF)    Echocardiogram 01/11/20: EF 45, Gr 3 DD, mild AI, mild AS (mean 18.7 mmHg) //  Echocardiogram 08/14/21:  EF 55-60, inf HK, mild LVH, Gr 1 DD, normal RVSF, RVSP 25.3, mild LAE, trivial MR, MAC, trivial AI, mild AS (mean 16 mmHg, Vmax 283 cm/s, DI 0.41)   History of nephrolithiasis 2004   Hyperlipidemia    Hypertension    IBS (irritable colon syndrome)    Ischemic cardiomyopathy    Mitral regurgitation    Echo 9/22: trivial MR   Postoperative atrial fibrillation (HCC)    PVC's (premature ventricular contractions)    Reflux    Type 2 diabetes mellitus (HCC)        Home Medications Prior to Admission medications   Medication Sig Start Date End Date Taking? Authorizing Provider  acetaminophen (TYLENOL) 500 MG tablet Take 1,000 mg by mouth every 6 (six) hours as needed for moderate pain or headache.    [provider]  albuterol (ACCUNEB) 1.25 MG/3ML nebulizer solution Take 1 ampule by nebulization every 6 (six) hours as needed for wheezing or shortness of breath. 05/15/23   [provider]  albuterol (VENTOLIN HFA) 108 (90 Base) MCG/ACT inhaler Inhale 2 puffs into the lungs every 6 (six) hours as needed for wheezing or shortness of breath. 09/22/22   [provider]  allopurinol (ZYLOPRIM) 300 MG tablet Take 1 tablet (300 mg total) by mouth daily. 01/05/23   Ellin Saba,  Vern Claude, MD  apixaban (ELIQUIS) 5 MG TABS tablet Take 1 tablet (5 mg total) by mouth 2 (two) times daily. 01/08/23   Pappayliou, Santina Evans A, DO  atorvastatin (LIPITOR) 40 MG tablet Take 40 mg by mouth at bedtime.    [provider]  bisacodyl (DULCOLAX) 5 MG EC tablet Take 5 mg by mouth daily as needed for moderate constipation.    [provider]  cyanocobalamin (VITAMIN B12) 1000 MCG tablet Take 1,000 mcg by mouth daily.    [provider]  cycloSPORINE (RESTASIS) 0.05 % ophthalmic emulsion Place 1 drop into both eyes 2 (two) times daily. 05/29/23   [provider]  docusate sodium (COLACE) 100 MG capsule Take 300 mg by mouth daily as needed for  mild constipation.    [provider]  famotidine (PEPCID) 20 MG tablet Take 20 mg by mouth 2 (two) times daily. 06/19/23   [provider]  fluticasone (FLONASE) 50 MCG/ACT nasal spray Place 1 spray into both nostrils daily. 06/24/23   Carnella Guadalajara, PA-C  furosemide (LASIX) 20 MG tablet Take 3 tablets (60 mg total) by mouth daily. 06/30/23 06/29/24  Sherryll Burger, Pratik D, DO  glipiZIDE (GLUCOTROL XL) 10 MG 24 hr tablet Take 10 mg by mouth daily.    [provider]  loratadine (CLARITIN) 10 MG tablet Take 10 mg by mouth daily. 04/25/22   [provider]  Melatonin 10 MG CAPS Take 30 mg by mouth at bedtime as needed (sleep).    [provider]  metFORMIN (GLUCOPHAGE-XR) 750 MG 24 hr tablet Take 1 tablet by mouth 2 (two) times daily with a meal. 01/05/23   [provider]  pantoprazole (PROTONIX) 40 MG tablet Take 40 mg by mouth daily. 02/07/20   [provider]  potassium chloride (MICRO-K) 10 MEQ CR capsule Take 10 mEq by mouth daily.    [provider]      Allergies    Patient has no known allergies.    Review of Systems   Review of Systems A 10 point review of systems was performed and is negative unless otherwise reported in HPI.  Physical Exam Updated Vital Signs BP 139/83   Pulse 82   Temp 98.5 F (36.9 C)   Resp (!) 24   Ht 5\' 11"  (1.803 m)   Wt 101.2 kg   SpO2 93%   BMI 31.10 kg/m  Physical Exam General: Chronically ill-appearing male, lying in bed.  HEENT: PERRLA, Sclera anicteric, MMM, trachea midline.  Cardiology: RRR, systolic murmur heard. BL radial and DP pulses equal bilaterally.  Resp: Normal respiratory rate and effort. No wheezes, rhonchi. Fine crackles heard intermittently in lower lung fields.  On 3L [place].  Abd: Soft, non-tender, non-distended. No rebound tenderness or guarding.  GU: Deferred. MSK: No peripheral edema or signs of trauma.  Skin: warm, dry.  Neuro: A&Ox4, CNs II-XII grossly  intact. MAEs. Sensation grossly intact.  Psych: Normal mood and affect.   ED Results / Procedures / Treatments   Labs (all labs ordered are listed, but only abnormal results are displayed) Labs Reviewed  BRAIN NATRIURETIC PEPTIDE - Abnormal; Notable for the following components:      Result Value   B Natriuretic Peptide 450.0 (*)    All other components within normal limits  CBC WITH DIFFERENTIAL/PLATELET - Abnormal; Notable for the following components:   RBC 4.06 (*)    Hemoglobin 11.6 (*)    HCT 35.9 (*)    All other components  within normal limits  COMPREHENSIVE METABOLIC PANEL - Abnormal; Notable for the following components:   Glucose, Bld 184 (*)    Calcium 8.5 (*)    Albumin 3.4 (*)    All other components within normal limits  SARS CORONAVIRUS 2 BY RT PCR  TROPONIN I (HIGH SENSITIVITY)  TROPONIN I (HIGH SENSITIVITY)    EKG EKG Interpretation Date/Time:  Friday August 07 2023 09:00:55 EDT Ventricular Rate:  86 PR Interval:    QRS Duration:  115 QT Interval:  417 QTC Calculation: 499 R Axis:   9  Text Interpretation: Atrial fibrillation Nonspecific intraventricular conduction delay Confirmed by Vivi Barrack [phone]) on 08/07/2023 10:16:07 AM  Radiology CT Angio Chest PE W and/or Wo Contrast  Result Date: 08/07/2023 CLINICAL DATA:  Pulmonary embolism (PE) suspected, high prob. Shortness of breath for 2 days. Cough. Hemoptysis. * Tracking Code: BO * EXAM: CT ANGIOGRAPHY CHEST WITH CONTRAST TECHNIQUE: Multidetector CT imaging of the chest was performed using the standard protocol during bolus administration of intravenous contrast. Multiplanar CT image reconstructions and MIPs were obtained to evaluate the vascular anatomy. RADIATION DOSE REDUCTION: This exam was performed according to the departmental dose-optimization program which includes automated exposure control, adjustment of the mA and/or kV according to patient size and/or use of iterative reconstruction  technique. CONTRAST:  75mL OMNIPAQUE IOHEXOL 350 MG/ML SOLN COMPARISON:  CT angiography chest from 12/07/2022. FINDINGS: Cardiovascular: No evidence of embolism to the proximal subsegmental pulmonary artery level. Mild cardiomegaly. No pericardial effusion. No aortic aneurysm. There are coronary artery calcifications, in keeping with coronary artery disease. There are also mild peripheral atherosclerotic vascular calcifications of thoracic aorta and its major branches. Mediastinum/Nodes: Visualized thyroid gland appears grossly unremarkable. No solid / cystic mediastinal masses. The esophagus is nondistended precluding optimal assessment. Redemonstration of multiple enlarged mediastinal and bilateral hilar lymph nodes without significant interval change since the prior study. For example, left lower paratracheal lymph node measures 13 x 20 mm, right lower paratracheal lymph node measures 19 x 24 mm. No significant interval change in size. No axillary lymphadenopathy by size criteria. Lungs/Pleura: The central tracheo-bronchial tree is patent. There is interval progression of peripheral/subpleural reticulations throughout bilateral lungs with lower lobe predominance. There are patchy areas of bronchiectasis. No honeycombing. Findings favor worsening pulmonary fibrosis. There is trace right and small left pleural effusion, new since the prior study. No acute consolidation or lung mass. Upper Abdomen: Visualized upper abdominal viscera within normal limits. Musculoskeletal: A CT Port-a-Cath is seen in the right upper chest wall with the catheter terminating in the upper portion of superior vena cava. Visualized soft tissues of the chest wall are otherwise grossly unremarkable. No suspicious osseous lesions. There are mild multilevel degenerative changes in the visualized spine. Review of the MIP images confirms the above findings. IMPRESSION: 1. No evidence of pulmonary embolism. 2. Interval progression of  peripheral/subpleural reticulations throughout bilateral lungs with lower lobe predominance. Patchy areas of bronchiectasis. No honeycombing. Findings favor worsening pulmonary fibrosis. 3. Trace right and small left pleural effusion, new since the prior study. 4. Stable mediastinal and bilateral hilar lymphadenopathy. 5. Multiple other nonacute observations, as described above. Aortic Atherosclerosis (ICD10-I70.0). Electronically Signed   By: Jules Schick M.D.   On: 08/07/2023 13:13   DG Chest 2 View  Result Date: 08/07/2023 CLINICAL DATA:  SOB EXAM: CHEST - 2 VIEW COMPARISON:  06/29/2023. FINDINGS: Low lung volume. Redemonstration of prominent interstitial markings, slightly improved when compared to the prior study. There is increasing opacification of  left mid lower lung zones. Left lung is grossly clear. There is blunting of right costophrenic angle, similar to the prior study, favoring probable trace right pleural effusion. Evaluation of cardiomediastinal silhouette is nondiagnostic due to left lower hemithorax opacification. However, no significant interval change. There are surgical staples along the heart border and sternotomy wires, status post CABG (coronary artery bypass graft). No acute osseous abnormalities. The soft tissues are within normal limits. Redemonstration of right-sided CT Port-A-Cath with its tip overlying the upper portion of superior vena cava. IMPRESSION: 1. Increasing opacification of the left mid lower lung zones, likely reflecting a combination of pleural effusion and atelectasis. Superimposed infection is not excluded in the appropriate clinical setting. 2. Trace right pleural effusion. Electronically Signed   By: Jules Schick M.D.   On: 08/07/2023 10:07    Procedures Procedures    Medications Ordered in ED Medications  iohexol (OMNIPAQUE) 350 MG/ML injection 75 mL (75 mLs Intravenous Contrast Given 08/07/23 1136)  furosemide (LASIX) injection 40 mg (40 mg Intravenous  Given 08/07/23 1344)    ED Course/ Medical Decision Making/ A&P                          Medical Decision Making Amount and/or Complexity of Data Reviewed Labs: ordered. Decision-making details documented in ED Course. Radiology: ordered. Decision-making details documented in ED Course.  Risk Prescription drug management.    This patient presents to the ED for concern of dyspnea, this involves an extensive number of treatment options, and is a complaint that carries with it a high risk of complications and morbidity.  I considered the following differential and admission for this acute, potentially life threatening condition.   MDM:    For patient's SOB/DOE, consider atypical ACS w/ DOE and will get troponin, EKG reassuringly does not show signs of ischemia. Consider HFrEF and pulm edema/pulm HTN as patient was recently hospitalized with shortness of breath thought to be due to this etiology.  He also has a history of COPD but has no wheezing on exam to suggest a COPD exacerbation.  Consider pneumonia or viral lower respiratory infection such as COVID given patient's exposures and will get a COVID test.  Chest x-ray demonstrates possible increasing opacification in the left mid lower lung zones which could be pleural effusion atelectasis or infection.  CT scan will be appropriate to further characterize this abnormality.  Additionally, cannot rule out PE even though patient does take eliquis as he has multiple risk factors - recent hospitalization, active malignancy, and he is coughing up brown sputum which could represent hemoptysis. Will perform CT PE for further evaluation.   Clinical Course as of 08/07/23 1357  Fri Aug 07, 2023  1030 DG Chest 2 View 1. Increasing opacification of the left mid lower lung zones, likely reflecting a combination of pleural effusion and atelectasis. Superimposed infection is not excluded in the appropriate clinical setting. 2. Trace right pleural  effusion.   [HN]  1317 Troponin I (High Sensitivity): 12 neg [HN]  1317 B Natriuretic Peptide(!): 450.0 Elevated BNP more than most recent values in 300 [HN]  1318 CT Angio Chest PE W and/or Wo Contrast 1. No evidence of pulmonary embolism. 2. Interval progression of peripheral/subpleural reticulations throughout bilateral lungs with lower lobe predominance. Patchy areas of bronchiectasis. No honeycombing. Findings favor worsening pulmonary fibrosis. 3. Trace right and small left pleural effusion, new since the prior study. 4. Stable mediastinal and bilateral hilar lymphadenopathy. 5. Multiple  other nonacute observations, as described above.  Aortic Atherosclerosis (ICD10-I70.0).   [HN]  1318 CT PE w/ no PE. Does show some trace pleural effusions and worsening pulm fibrosis. Patient is informed of the findings of his workup today. Will give patient dose of lasix here for what increased fluid/pleural effusions, acute on chronic HF are contributing to his symptoms. He has no increased WOB and is stable on his home O2. Ambulated w/ home O2 and stayed above 93%. Do not believe he requires admission to the hospital at this time and patient prefers to be discharged. Advised he follow up with PCP or pulmonlogist for further evaluation of his worsening pulmonary fibrosis. Patient is stable on his home O2.  [HN]  1352 Troponin I (High Sensitivity): 11 Neg flat [HN]    Clinical Course User Index [HN] Loetta Rough, MD    Labs: I Ordered, and personally interpreted labs.  The pertinent results include: Those listed above  Imaging Studies ordered: I ordered imaging studies including chest x-ray, CT PE I independently visualized and interpreted imaging. I agree with the radiologist interpretation  Additional history obtained from chart review, wife at bedside.    Cardiac Monitoring: The patient was maintained on a cardiac monitor.  I personally viewed and interpreted the cardiac monitored  which showed an underlying rhythm of: Atrial fibrillation rate controlled  Reevaluation: After the interventions noted above, I reevaluated the patient and found that they have :stayed the same  Social Determinants of Health:  lives independently  Disposition:  DC  Co morbidities that complicate the patient evaluation  Past Medical History:  Diagnosis Date   A-fib (HCC)    AAA Ultrasound 01/11/2020    AAA [place] 01/11/20:  no abdominal aortic aneurysm, 2.4 cm   Aortic insufficiency    a. mild-mod by intraop TEE 05/2018 (no aortic stenosis) // Echo 2/21: mild //  Echo 9/22: trivial   Aortic stenosis    Echo 9/22: mild (mean 16) // Echo 2/21: mild (mean 18.7)   CAD (coronary artery disease)    a. NSTEMI 05/2018 with multivessel disease -> s/p CABGx4 05/11/18 // Myoview 06/07/20: inf scar w mild peri-infarct ischemia, EF 37; Int Risk   Cancer (HCC)    Carotid artery disease (HCC)    PreCABG [place] 05/07/18: Bilat ICA 1-39   CKD (chronic kidney disease), stage III (HCC)    HFimpEF (heart failure with improved EF)    Echocardiogram 01/11/20: EF 45, Gr 3 DD, mild AI, mild AS (mean 18.7 mmHg) // Echocardiogram 08/14/21:  EF 55-60, inf HK, mild LVH, Gr 1 DD, normal RVSF, RVSP 25.3, mild LAE, trivial MR, MAC, trivial AI, mild AS (mean 16 mmHg, Vmax 283 cm/s, DI 0.41)   History of nephrolithiasis 2004   Hyperlipidemia    Hypertension    IBS (irritable colon syndrome)    Ischemic cardiomyopathy    Mitral regurgitation    Echo 9/22: trivial MR   Postoperative atrial fibrillation (HCC)    PVC's (premature ventricular contractions)    Reflux    Type 2 diabetes mellitus (HCC)      Medicines Meds ordered this encounter  Medications   iohexol (OMNIPAQUE) 350 MG/ML injection 75 mL   DISCONTD: furosemide (LASIX) injection 20 mg   furosemide (LASIX) injection 40 mg    I have reviewed the patients home medicines and have made adjustments as needed  Problem List / ED Course: Problem List Items Addressed  This Visit   None Visit Diagnoses  HFrEF (heart failure with reduced ejection fraction) (HCC)    -  Primary   Relevant Medications   furosemide (LASIX) injection 40 mg (Completed)   Pulmonary fibrosis (HCC)       Dyspnea on exertion                       This note was created using dictation software, which may contain spelling or grammatical errors.    Loetta Rough, MD 08/07/23 [phone]

## 2023-08-16 ENCOUNTER — Other Ambulatory Visit: Payer: Self-pay

## 2023-08-16 ENCOUNTER — Emergency Department (HOSPITAL_COMMUNITY): Payer: Medicare Other

## 2023-08-16 ENCOUNTER — Inpatient Hospital Stay (HOSPITAL_COMMUNITY)
Admission: EM | Admit: 2023-08-16 | Discharge: 2023-08-18 | DRG: 189 | Disposition: A | Discharge status: Home / Self Care | Payer: Medicare Other | Attending: Internal Medicine | Admitting: Internal Medicine

## 2023-08-16 ENCOUNTER — Encounter (HOSPITAL_COMMUNITY): Payer: Self-pay | Admitting: Emergency Medicine

## 2023-08-16 DIAGNOSIS — J189 Pneumonia, unspecified organism: Secondary | ICD-10-CM

## 2023-08-16 DIAGNOSIS — I252 Old myocardial infarction: Secondary | ICD-10-CM

## 2023-08-16 DIAGNOSIS — Z8616 Personal history of COVID-19: Secondary | ICD-10-CM

## 2023-08-16 DIAGNOSIS — I130 Hypertensive heart and chronic kidney disease with heart failure and stage 1 through stage 4 chronic kidney disease, or unspecified chronic kidney disease: Secondary | ICD-10-CM | POA: Diagnosis present

## 2023-08-16 DIAGNOSIS — I5043 Acute on chronic combined systolic (congestive) and diastolic (congestive) heart failure: Secondary | ICD-10-CM | POA: Diagnosis present

## 2023-08-16 DIAGNOSIS — I480 Paroxysmal atrial fibrillation: Secondary | ICD-10-CM | POA: Diagnosis present

## 2023-08-16 DIAGNOSIS — E876 Hypokalemia: Secondary | ICD-10-CM | POA: Diagnosis present

## 2023-08-16 DIAGNOSIS — Z7984 Long term (current) use of oral hypoglycemic drugs: Secondary | ICD-10-CM

## 2023-08-16 DIAGNOSIS — Z8249 Family history of ischemic heart disease and other diseases of the circulatory system: Secondary | ICD-10-CM

## 2023-08-16 DIAGNOSIS — Z6832 Body mass index (BMI) 32.0-32.9, adult: Secondary | ICD-10-CM

## 2023-08-16 DIAGNOSIS — J8410 Pulmonary fibrosis, unspecified: Secondary | ICD-10-CM | POA: Diagnosis not present

## 2023-08-16 DIAGNOSIS — J9601 Acute respiratory failure with hypoxia: Principal | ICD-10-CM | POA: Diagnosis present

## 2023-08-16 DIAGNOSIS — Z833 Family history of diabetes mellitus: Secondary | ICD-10-CM

## 2023-08-16 DIAGNOSIS — I2510 Atherosclerotic heart disease of native coronary artery without angina pectoris: Secondary | ICD-10-CM | POA: Diagnosis present

## 2023-08-16 DIAGNOSIS — I255 Ischemic cardiomyopathy: Secondary | ICD-10-CM | POA: Diagnosis present

## 2023-08-16 DIAGNOSIS — Z87442 Personal history of urinary calculi: Secondary | ICD-10-CM

## 2023-08-16 DIAGNOSIS — N189 Chronic kidney disease, unspecified: Secondary | ICD-10-CM | POA: Diagnosis present

## 2023-08-16 DIAGNOSIS — R0600 Dyspnea, unspecified: Secondary | ICD-10-CM

## 2023-08-16 DIAGNOSIS — Z9981 Dependence on supplemental oxygen: Secondary | ICD-10-CM

## 2023-08-16 DIAGNOSIS — Z951 Presence of aortocoronary bypass graft: Secondary | ICD-10-CM

## 2023-08-16 DIAGNOSIS — Z79899 Other long term (current) drug therapy: Secondary | ICD-10-CM

## 2023-08-16 DIAGNOSIS — Z1152 Encounter for screening for COVID-19: Secondary | ICD-10-CM

## 2023-08-16 DIAGNOSIS — I509 Heart failure, unspecified: Principal | ICD-10-CM

## 2023-08-16 DIAGNOSIS — I080 Rheumatic disorders of both mitral and aortic valves: Secondary | ICD-10-CM | POA: Diagnosis present

## 2023-08-16 DIAGNOSIS — I2489 Other forms of acute ischemic heart disease: Secondary | ICD-10-CM | POA: Diagnosis present

## 2023-08-16 DIAGNOSIS — I10 Essential (primary) hypertension: Secondary | ICD-10-CM | POA: Diagnosis present

## 2023-08-16 DIAGNOSIS — Z7901 Long term (current) use of anticoagulants: Secondary | ICD-10-CM

## 2023-08-16 DIAGNOSIS — E785 Hyperlipidemia, unspecified: Secondary | ICD-10-CM | POA: Diagnosis present

## 2023-08-16 DIAGNOSIS — I482 Chronic atrial fibrillation, unspecified: Secondary | ICD-10-CM | POA: Diagnosis present

## 2023-08-16 DIAGNOSIS — E1165 Type 2 diabetes mellitus with hyperglycemia: Secondary | ICD-10-CM | POA: Diagnosis present

## 2023-08-16 DIAGNOSIS — Z825 Family history of asthma and other chronic lower respiratory diseases: Secondary | ICD-10-CM

## 2023-08-16 DIAGNOSIS — J84112 Idiopathic pulmonary fibrosis: Secondary | ICD-10-CM | POA: Diagnosis present

## 2023-08-16 DIAGNOSIS — E669 Obesity, unspecified: Secondary | ICD-10-CM | POA: Diagnosis present

## 2023-08-16 DIAGNOSIS — K219 Gastro-esophageal reflux disease without esophagitis: Secondary | ICD-10-CM | POA: Diagnosis present

## 2023-08-16 DIAGNOSIS — E1122 Type 2 diabetes mellitus with diabetic chronic kidney disease: Secondary | ICD-10-CM | POA: Diagnosis present

## 2023-08-16 LAB — CBC WITH DIFFERENTIAL/PLATELET
Abs Immature Granulocytes: 0.07 10*3/uL (ref 0.00–0.07)
Basophils Absolute: 0 10*3/uL (ref 0.0–0.1)
Basophils Relative: 0 %
Eosinophils Absolute: 0.1 10*3/uL (ref 0.0–0.5)
Eosinophils Relative: 1 %
HCT: 34.3 % — ABNORMAL LOW (ref 39.0–52.0)
Hemoglobin: 10.8 g/dL — ABNORMAL LOW (ref 13.0–17.0)
Immature Granulocytes: 1 %
Lymphocytes Relative: 17 %
Lymphs Abs: 1.9 10*3/uL (ref 0.7–4.0)
MCH: 28.2 pg (ref 26.0–34.0)
MCHC: 31.5 g/dL (ref 30.0–36.0)
MCV: 89.6 fL (ref 80.0–100.0)
Monocytes Absolute: 1.4 10*3/uL — ABNORMAL HIGH (ref 0.1–1.0)
Monocytes Relative: 13 %
Neutro Abs: 7.5 10*3/uL (ref 1.7–7.7)
Neutrophils Relative %: 68 %
Platelets: 223 10*3/uL (ref 150–400)
RBC: 3.83 MIL/uL — ABNORMAL LOW (ref 4.22–5.81)
RDW: 15.3 % (ref 11.5–15.5)
WBC: 10.9 10*3/uL — ABNORMAL HIGH (ref 4.0–10.5)
nRBC: 0 % (ref 0.0–0.2)

## 2023-08-16 LAB — COMPREHENSIVE METABOLIC PANEL
ALT: 13 U/L (ref 0–44)
AST: 15 U/L (ref 15–41)
Albumin: 3.6 g/dL (ref 3.5–5.0)
Alkaline Phosphatase: 85 U/L (ref 38–126)
Anion gap: 10 (ref 5–15)
BUN: 15 mg/dL (ref 8–23)
CO2: 25 mmol/L (ref 22–32)
Calcium: 8.8 mg/dL — ABNORMAL LOW (ref 8.9–10.3)
Chloride: 101 mmol/L (ref 98–111)
Creatinine, Ser: 0.92 mg/dL (ref 0.61–1.24)
GFR, Estimated: >60 mL/min (ref >60)
Glucose, Bld: 228 mg/dL — ABNORMAL HIGH (ref 70–99)
Potassium: 3.3 mmol/L — ABNORMAL LOW (ref 3.5–5.1)
Sodium: 136 mmol/L (ref 135–145)
Total Bilirubin: 0.5 mg/dL (ref 0.3–1.2)
Total Protein: 7 g/dL (ref 6.5–8.1)

## 2023-08-16 LAB — BRAIN NATRIURETIC PEPTIDE: B Natriuretic Peptide: 470 pg/mL — ABNORMAL HIGH (ref 0.0–100.0)

## 2023-08-16 LAB — TROPONIN I (HIGH SENSITIVITY): Troponin I (High Sensitivity): 23 ng/L — ABNORMAL HIGH (ref <18)

## 2023-08-16 MED ORDER — IOHEXOL 350 MG/ML SOLN
75.0000 mL | Freq: Once | INTRAVENOUS | Status: AC | PRN
  Administered 2023-08-16: 75 mL via INTRAVENOUS

## 2023-08-16 MED ORDER — IPRATROPIUM-ALBUTEROL 0.5-2.5 (3) MG/3ML IN SOLN
3.0000 mL | Freq: Once | RESPIRATORY_TRACT | Status: AC
  Administered 2023-08-16: 3 mL via RESPIRATORY_TRACT
  Filled 2023-08-16: qty 3

## 2023-08-16 MED ORDER — ALBUTEROL SULFATE (2.5 MG/3ML) 0.083% IN NEBU
INHALATION_SOLUTION | RESPIRATORY_TRACT | Status: AC
  Administered 2023-08-16: 2.5 mg
  Filled 2023-08-16: qty 3

## 2023-08-16 MED ORDER — ALBUTEROL SULFATE HFA 108 (90 BASE) MCG/ACT IN AERS: 2.0000 | INHALATION_SPRAY | RESPIRATORY_TRACT | Status: DC | PRN

## 2023-08-16 MED ORDER — FUROSEMIDE 10 MG/ML IJ SOLN
40.0000 mg | Freq: Once | INTRAMUSCULAR | Status: AC
  Administered 2023-08-17: 40 mg via INTRAVENOUS
  Filled 2023-08-16: qty 4

## 2023-08-16 NOTE — ED Provider Notes (Incomplete)
[place] EMERGENCY DEPARTMENT AT [facility] Provider Note  CSN: [id number] Arrival date & time: 08/16/23 2153  Chief Complaint(s) Shortness of Breath and Chest Pain  HPI LARONE MCGONAGLE is a 82 y.o. male with past medical history as below, significant for A-fib, previously on Eliquis but has not been able to take for the past 3 weeks, CAD, CKD, CHF, HTN, HLD, MR who presents to the ED with complaint of dyspnea  Patient reports dyspnea over the past few days, worse last 24 hrs.  He unfortunately been unable to have his Eliquis or Entresto filled in the past 3 weeks secondary to insurance issues.  Patient with progressively worsening dyspnea over the past 24 hours, exertional dyspnea, unable to ambulate more than 10 or 15 feet without becoming severely dyspneic having sit down.  He has had to increase his home oxygen from 3 L to 4 L.  No cough, reports mild chest pain that has resolved since onset. No nausea or vomiting.  No fevers or chills.  No unexpected weight changes.  Past Medical History Past Medical History:  Diagnosis Date  . A-fib (HCC)   . AAA Ultrasound 01/11/2020    AAA [place] 01/11/20:  no abdominal aortic aneurysm, 2.4 cm  . Aortic insufficiency    a. mild-mod by intraop TEE 05/2018 (no aortic stenosis) // Echo 2/21: mild //  Echo 9/22: trivial  . Aortic stenosis    Echo 9/22: mild (mean 16) // Echo 2/21: mild (mean 18.7)  . CAD (coronary artery disease)    a. NSTEMI 05/2018 with multivessel disease -> s/p CABGx4 05/11/18 // Myoview 06/07/20: inf scar w mild peri-infarct ischemia, EF 37; Int Risk  . Cancer (HCC)   . Carotid artery disease (HCC)    PreCABG [place] 05/07/18: Bilat ICA 1-39  . CKD (chronic kidney disease), stage III (HCC)   . HFimpEF (heart failure with improved EF)    Echocardiogram 01/11/20: EF 45, Gr 3 DD, mild AI, mild AS (mean 18.7 mmHg) // Echocardiogram 08/14/21:  EF 55-60, inf HK, mild LVH, Gr 1 DD, normal RVSF, RVSP 25.3, mild LAE, trivial MR, MAC, trivial  AI, mild AS (mean 16 mmHg, Vmax 283 cm/s, DI 0.41)  . History of nephrolithiasis 2004  . Hyperlipidemia   . Hypertension   . IBS (irritable colon syndrome)   . Ischemic cardiomyopathy   . Mitral regurgitation    Echo 9/22: trivial MR  . Postoperative atrial fibrillation (HCC)   . PVC's (premature ventricular contractions)   . Reflux   . Type 2 diabetes mellitus [facility])    Patient Active Problem List   Diagnosis Date Noted  . Acute exacerbation of CHF (congestive heart failure) (HCC) 06/30/2023  . Acute CHF (HCC) 06/29/2023  . Acute on chronic diastolic CHF (congestive heart failure) (HCC) 02/14/2023  . Paroxysmal atrial fibrillation (HCC) 02/14/2023  . Follicular lymphoma of lymph nodes of inguinal region (HCC) 01/06/2023  . Follicular lymphoma grade 3a (HCC) 12/23/2022  . Inguinal lymphadenopathy 12/15/2022  . Mediastinal adenopathy 11/17/2022  . Generalized lymphadenopathy 09/16/2022  . Headache 10/28/2021  . Upper airway cough syndrome 10/28/2021  . Hypothermia   . Uncontrolled type 2 diabetes mellitus with hyperglycemia, without long-term current use of insulin (HCC) 07/13/2021  . (HFimpEF) heart failure with improved EF 07/13/2021  . SIRS (systemic inflammatory response syndrome) (HCC) 07/11/2021  . Acute metabolic encephalopathy 07/11/2021  . S/P CABG x 4 05/11/2018  . CKD (chronic kidney disease), stage III (HCC) 05/07/2018  . Obesity  05/07/2018  . Aortic stenosis 05/07/2018  . Non-ST elevation (NSTEMI) myocardial infarction (HCC) 05/07/2018  . CAD (coronary artery disease) 05/07/2018  . DOE (dyspnea on exertion)   . Leukocytosis 05/02/2018  . Flash pulmonary edema (HCC) 05/02/2018  . Diabetic neuropathy with neurologic complication (HCC) 03/29/2013  . Spinal stenosis of lumbar region 03/29/2013  . Melena 03/02/2013  . GERD (gastroesophageal reflux disease) 09/02/2012  . Helicobacter pylori (H. pylori) 09/02/2012  . Essential hypertension 01/08/2012  . Diabetes  mellitus (HCC) 01/08/2012  . Hyperlipidemia    Home Medication(s) Prior to Admission medications   Medication Sig Start Date End Date Taking? Authorizing Provider  acetaminophen (TYLENOL) 500 MG tablet Take 1,000 mg by mouth every 6 (six) hours as needed for moderate pain or headache.    [provider]  albuterol (ACCUNEB) 1.25 MG/3ML nebulizer solution Take 1 ampule by nebulization every 6 (six) hours as needed for wheezing or shortness of breath. 05/15/23   [provider]  albuterol (VENTOLIN HFA) 108 (90 Base) MCG/ACT inhaler Inhale 2 puffs into the lungs every 6 (six) hours as needed for wheezing or shortness of breath. 09/22/22   [provider]  allopurinol (ZYLOPRIM) 300 MG tablet Take 1 tablet (300 mg total) by mouth daily. 01/05/23   Doreatha Massed, MD  apixaban (ELIQUIS) 5 MG TABS tablet Take 1 tablet (5 mg total) by mouth 2 (two) times daily. 01/08/23   Pappayliou, Santina Evans A, DO  atorvastatin (LIPITOR) 40 MG tablet Take 40 mg by mouth at bedtime.    [provider]  bisacodyl (DULCOLAX) 5 MG EC tablet Take 5 mg by mouth daily as needed for moderate constipation.    [provider]  cyanocobalamin (VITAMIN B12) 1000 MCG tablet Take 1,000 mcg by mouth daily.    [provider]  cycloSPORINE (RESTASIS) 0.05 % ophthalmic emulsion Place 1 drop into both eyes 2 (two) times daily. 05/29/23   [provider]  docusate sodium (COLACE) 100 MG capsule Take 300 mg by mouth daily as needed for mild constipation.    [provider]  famotidine (PEPCID) 20 MG tablet Take 20 mg by mouth 2 (two) times daily. 06/19/23   [provider]  fluticasone (FLONASE) 50 MCG/ACT nasal spray Place 1 spray into both nostrils daily. 06/24/23   Carnella Gu[place], PA-C  furosemide (LASIX) 20 MG tablet Take 3 tablets (60 mg total) by mouth daily. 06/30/23 06/29/24  Sherryll Burger, Pratik D, DO  glipiZIDE (GLUCOTROL XL) 10 MG 24 hr tablet Take 10  mg by mouth daily.    [provider]  loratadine (CLARITIN) 10 MG tablet Take 10 mg by mouth daily. 04/25/22   [provider]  Melatonin 10 MG CAPS Take 30 mg by mouth at bedtime as needed (sleep).    [provider]  metFORMIN (GLUCOPHAGE-XR) 750 MG 24 hr tablet Take 1 tablet by mouth 2 (two) times daily with a meal. 01/05/23   [provider]  pantoprazole (PROTONIX) 40 MG tablet Take 40 mg by mouth daily. 02/07/20   [provider]  potassium chloride (MICRO-K) 10 MEQ CR capsule Take 10 mEq by mouth daily.    [provider]  Past Surgical History Past Surgical History:  Procedure Laterality Date  . BRONCHIAL NEEDLE ASPIRATION BIOPSY  11/17/2022   Procedure: BRONCHIAL NEEDLE ASPIRATION BIOPSIES;  Surgeon: Leslye Peer, MD;  Location: [facility] ENDOSCOPY;  Service: Cardiopulmonary;;  . CARDIOVERSION    . CATARACT EXTRACTION W/PHACO Right 07/13/2023   Procedure: CATARACT EXTRACTION PHACO AND INTRAOCULAR LENS PLACEMENT (IOC) WITH PLACEMENT OF CORTICOSTEROID;  Surgeon: Fabio Pierce, MD;  Location: [facility] ORS;  Service: Ophthalmology;  Laterality: Right;  CDE 17.32  . CORONARY ARTERY BYPASS GRAFT N/A 05/11/2018   Procedure: CORONARY ARTERY BYPASS GRAFTING (CABG), on pump, times four, using left internal mammary artery and endoscopically harvested right greater saphenous leg vein.;  Surgeon: Kerin Perna, MD;  Location: [facility] OR;  Service: Open Heart Surgery;  Laterality: N/A;  . ESOPHAGOGASTRODUODENOSCOPY  02/04/2012   Procedure: ESOPHAGOGASTRODUODENOSCOPY (EGD);  Surgeon: Malissa Hippo, MD;  Location: [facility] ENDO SUITE;  Service: Endoscopy;  Laterality: N/A;  100  . INGUINAL LYMPH NODE BIOPSY Left 12/15/2022   Procedure: INGUINAL LYMPH NODE BIOPSY;  Surgeon: Theophilus Kinds A, DO;  Location: [facility] ORS;  Service: General;   Laterality: Left;  . LEFT HEART CATH AND CORONARY ANGIOGRAPHY N/A 05/04/2018   Procedure: LEFT HEART CATH AND CORONARY ANGIOGRAPHY;  Surgeon: Lyn Records, MD;  Location: [facility] INVASIVE CV LAB;  Service: Cardiovascular;  Laterality: N/A;  . NASAL FRACTURE SURGERY    . PORTACATH PLACEMENT Right 01/06/2023   Procedure: INSERTION PORT-A-CATH;  Surgeon: Lewie Chamber, DO;  Location: [facility] ORS;  Service: General;  Laterality: Right;  . TEE WITHOUT CARDIOVERSION N/A 05/11/2018   Procedure: TRANSESOPHAGEAL ECHOCARDIOGRAM (TEE);  Surgeon: Donata Clay, Theron Arista, MD;  Location: [facility] OR;  Service: Open Heart Surgery;  Laterality: N/A;  . VIDEO BRONCHOSCOPY WITH ENDOBRONCHIAL ULTRASOUND Right 11/17/2022   Procedure: VIDEO BRONCHOSCOPY WITH ENDOBRONCHIAL ULTRASOUND;  Surgeon: Leslye Peer, MD;  Location: [facility] ENDOSCOPY;  Service: Cardiopulmonary;  Laterality: Right;   Family History Family History  Problem Relation Age of Onset  . Diabetes Mellitus II Mother   . Heart attack Father   . Heart attack Brother   . Heart disease Other   . Arthritis Other   . Asthma Other   . Diabetes Other     Social History Social History   Tobacco Use  . Smoking status: Never  . Smokeless tobacco: Never  Vaping Use  . Vaping status: Never Used  Substance Use Topics  . Alcohol use: No    Alcohol/week: 0.0 standard drinks of alcohol  . Drug use: No   Allergies Patient has no known allergies.  Review of Systems Review of Systems  Constitutional:  Positive for fatigue. Negative for chills and fever.  Respiratory:  Positive for chest tightness and shortness of breath.   Gastrointestinal:  Negative for abdominal pain, nausea and vomiting.  Genitourinary:  Negative for difficulty urinating and dysuria.  Skin:  Negative for pallor.  Neurological:  Negative for syncope and light-headedness.  All other systems reviewed and are negative.   Physical Exam Vital Signs  I have reviewed the triage vital signs BP  (!) 150/98   Pulse 91   Temp 98.3 F (36.8 C) (Oral)   Resp 17   Ht 5\' 11"  (1.803 m)   Wt 101.2 kg   SpO2 97%   BMI 31.10 kg/m  Physical Exam Vitals and nursing note reviewed.  Constitutional:      General: He is not in acute distress.    Appearance: He is well-developed. He is  obese.  HENT:     Head: Normocephalic and atraumatic.     Right Ear: External ear normal.     Left Ear: External ear normal.     Mouth/Throat:     Mouth: Mucous membranes are moist.  Eyes:     General: No scleral icterus. Cardiovascular:     Rate and Rhythm: Normal rate and regular rhythm.     Pulses: Normal pulses.     Heart sounds: Normal heart sounds.  Pulmonary:     Effort: Pulmonary effort is normal. Tachypnea present. No respiratory distress.     Breath sounds: Wheezing present.  Abdominal:     General: Abdomen is flat.     Palpations: Abdomen is soft.     Tenderness: There is no abdominal tenderness.  Musculoskeletal:     Cervical back: No rigidity.     Right lower leg: Edema present.     Left lower leg: Edema present.  Skin:    General: Skin is warm and dry.     Capillary Refill: Capillary refill takes less than 2 seconds.  Neurological:     Mental Status: He is alert.  Psychiatric:        Mood and Affect: Mood normal.        Behavior: Behavior normal.     ED Results and Treatments Labs (all labs ordered are listed, but only abnormal results are displayed) Labs Reviewed  COMPREHENSIVE METABOLIC PANEL - Abnormal; Notable for the following components:      Result Value   Potassium 3.3 (*)    Glucose, Bld 228 (*)    Calcium 8.8 (*)    All other components within normal limits  CBC WITH DIFFERENTIAL/PLATELET - Abnormal; Notable for the following components:   WBC 10.9 (*)    RBC 3.83 (*)    Hemoglobin 10.8 (*)    HCT 34.3 (*)    Monocytes Absolute 1.4 (*)    All other components within normal limits  BRAIN NATRIURETIC PEPTIDE - Abnormal; Notable for the following  components:   B Natriuretic Peptide 470.0 (*)    All other components within normal limits  TROPONIN I (HIGH SENSITIVITY) - Abnormal; Notable for the following components:   Troponin I (High Sensitivity) 23 (*)    All other components within normal limits  TROPONIN I (HIGH SENSITIVITY)                                                                                                                          Radiology CT Angio Chest PE W and/or Wo Contrast  Result Date: 08/17/2023 CLINICAL DATA:  Shortness of breath since last night. Ran out of Eliquis. EXAM: CT ANGIOGRAPHY CHEST WITH CONTRAST TECHNIQUE: Multidetector CT imaging of the chest was performed using the standard protocol during bolus administration of intravenous contrast. Multiplanar CT image reconstructions and MIPs were obtained to evaluate the vascular anatomy. RADIATION DOSE REDUCTION: This exam was performed according to the departmental dose-optimization program which includes  automated exposure control, adjustment of the mA and/or kV according to patient size and/or use of iterative reconstruction technique. CONTRAST:  75mL OMNIPAQUE IOHEXOL 350 MG/ML SOLN COMPARISON:  Chest radiograph 08/16/2023 and CTA chest 08/07/2023 FINDINGS: Cardiovascular: Negative for acute pulmonary embolism. Cardiomegaly. No pericardial effusion. Coronary artery and aortic atherosclerotic calcification. Right chest wall Port-A-Cath with tip in the low SVC. Sternotomy and CABG. Mediastinum/Nodes: Trachea and esophagus are unremarkable. Unchanged enlarged mediastinal and hilar lymph nodes. For example left lower paratracheal lymph node measures 21 by 19 mm on series 5/image 131. Lungs/Pleura: Bilateral subpleural reticular and ground-glass opacities compatible with interstitial lung disease are similar to prior. There are new bilateral peribronchovascular predominant patchy ground-glass opacities. Mild interlobular septal thickening in the lower lungs. Small  bilateral pleural effusions. Upper Abdomen: No acute abnormality. Musculoskeletal: No acute fracture or destructive osseous lesion. Remote right rib fractures. Review of the MIP images confirms the above findings. IMPRESSION: 1. Negative for acute pulmonary embolism. 2. Patchy ground-glass opacities and interlobular septal thickening superimposed on a background of interstitial lung disease. Pulmonary edema is favored though atypical infection could be considered in the appropriate clinical setting. 3. Unchanged enlarged mediastinal and hilar lymph nodes. Aortic Atherosclerosis (ICD10-I70.0). Electronically Signed   By: Minerva Fester M.D.   On: 08/17/2023 00:00   DG Chest Port 1 View  Result Date: 08/16/2023 CLINICAL DATA:  Chest pain EXAM: PORTABLE CHEST 1 VIEW COMPARISON:  Radiograph and CT 08/07/2023 FINDINGS: Stable cardiomegaly. Sternotomy and CABG. Right chest wall Port-A-Cath tip in the upper SVC. Diffuse bilateral hazy interstitial and airspace opacities greatest in the left mid and lower lung. Small left and trace right pleural effusions. No pneumothorax. IMPRESSION: Findings suggestive of CHF. Electronically Signed   By: Minerva Fester M.D.   On: 08/16/2023 22:26    Pertinent labs & imaging results that were available during my care of the patient were reviewed by me and considered in my medical decision making (see MDM for details).  Medications Ordered in ED Medications  albuterol (VENTOLIN HFA) 108 (90 Base) MCG/ACT inhaler 2 puff (has no administration in time range)  cefTRIAXone (ROCEPHIN) 2 g in sodium chloride 0.9 % 100 mL IVPB (has no administration in time range)  azithromycin (ZITHROMAX) 500 mg in sodium chloride 0.9 % 250 mL IVPB (has no administration in time range)  ipratropium-albuterol (DUONEB) 0.5-2.5 (3) MG/3ML nebulizer solution 3 mL (has no administration in time range)  potassium chloride SA (KLOR-CON M) CR tablet 40 mEq (has no administration in time range)   ipratropium-albuterol (DUONEB) 0.5-2.5 (3) MG/3ML nebulizer solution 3 mL (3 mLs Nebulization Given 08/16/23 2256)  albuterol (PROVENTIL) (2.5 MG/3ML) 0.083% nebulizer solution (2.5 mg  Given 08/16/23 2256)  iohexol (OMNIPAQUE) 350 MG/ML injection 75 mL (75 mLs Intravenous Contrast Given 08/16/23 2335)  furosemide (LASIX) injection 40 mg (40 mg Intravenous Given 08/17/23 0012)  Procedures .Critical Care  Performed by: Sloan Leiter, DO Authorized by: Sloan Leiter, DO   Critical care provider statement:    Critical care time (minutes):  30   Critical care time was exclusive of:  Separately billable procedures and treating other patients   Critical care was necessary to treat or prevent imminent or life-threatening deterioration of the following conditions:  Respiratory failure   Critical care was time spent personally by me on the following activities:  Development of treatment plan with patient or surrogate, discussions with consultants, evaluation of patient's response to treatment, examination of patient, ordering and review of laboratory studies, ordering and review of radiographic studies, ordering and performing treatments and interventions, pulse oximetry, re-evaluation of patient's condition, review of old charts and obtaining history from patient or surrogate   Care discussed with: admitting provider     (including critical care time)  Medical Decision Making / ED Course    Medical Decision Making:    HYDER BACAK is a 82 y.o. male with past medical history as below, significant for A-fib, previously on Eliquis but has not been able to take for the past 3 weeks, CAD, CKD, CHF, HTN, HLD, MR who presents to the ED with complaint of dyspnea. The complaint involves an extensive differential diagnosis and also carries with it a high risk of complications  and morbidity.  Serious etiology was considered. Ddx includes but is not limited to: In my evaluation of this patient's dyspnea my DDx includes, but is not limited to, pneumonia, pulmonary embolism, pneumothorax, pulmonary edema, metabolic acidosis, asthma, COPD, cardiac cause, anemia, anxiety, etc.  Differential includes all life-threatening causes for chest pain. This includes but is not exclusive to acute coronary syndrome, aortic dissection, pulmonary embolism, cardiac tamponade, community-acquired pneumonia, pericarditis, musculoskeletal chest wall pain, etc.   Complete initial physical exam performed, notably the patient  was tachypnea noted, no respiratory distress, afebrile.  Breathing comfortably on 4.5 L nasal cannula.    Reviewed and confirmed nursing documentation for past medical history, family history, social history.  Vital signs reviewed.    Clinical Course as of 08/17/23 0033  Wynelle Link Aug 16, 2023  2247 CXR c/w CHF [SG]  2251 Reports dx COVID19 3 weeks ago [SG]  2337 B Natriuretic Peptide(!): 470.0 [SG]  2338 Troponin I (High Sensitivity)(!): 23 [SG]  2338 GFR, Estimated: >60 Give lasix 40mg  IV [SG]  Mon Aug 17, 2023  0003 CTPE with pulmonary edema vs atypical infection, no PE [SG]  0017 ?atypical pna on CTA, pt with productive cough w/ brown sputum worsening last 1-2 wks, chills, no fever, worsening exertional dyspnea [SG]  0019 Attempted to ambulate patient, became severely dyspneic, conversational dyspnea, hypoxia mid 80's [SG]  0031 Troponin I (High Sensitivity)(!): 23 Favor demand ischemia, EKG stable  [SG]  0032 PSI 110 [SG]    Clinical Course User Index [SG] Sloan Leiter, DO     Patient with recent COVID-19 infection, worsening exertional dyspnea, he has not had his Lasix for approximately 3 weeks.  Will get CT PE.  Pt with continued exertional dyspnea, concern pos pna on imaging. He is not septic  Plan admission for acute chf, worsening dyspnea, atypical pna;  pt agreeable with plan                  Additional history obtained: -Additional history obtained from caregiver -External records from outside source obtained and reviewed including: Chart review including previous notes, labs, imaging, consultation notes including  Home meds Prior labs/imaging    Lab Tests: -I ordered, reviewed, and interpreted labs.   The pertinent results include:   Labs Reviewed  COMPREHENSIVE METABOLIC PANEL - Abnormal; Notable for the following components:      Result Value   Potassium 3.3 (*)    Glucose, Bld 228 (*)    Calcium 8.8 (*)    All other components within normal limits  CBC WITH DIFFERENTIAL/PLATELET - Abnormal; Notable for the following components:   WBC 10.9 (*)    RBC 3.83 (*)    Hemoglobin 10.8 (*)    HCT 34.3 (*)    Monocytes Absolute 1.4 (*)    All other components within normal limits  BRAIN NATRIURETIC PEPTIDE - Abnormal; Notable for the following components:   B Natriuretic Peptide 470.0 (*)    All other components within normal limits  TROPONIN I (HIGH SENSITIVITY) - Abnormal; Notable for the following components:   Troponin I (High Sensitivity) 23 (*)    All other components within normal limits  TROPONIN I (HIGH SENSITIVITY)    Notable for as above  EKG   EKG Interpretation Date/Time:    Ventricular Rate:    PR Interval:    QRS Duration:    QT Interval:    QTC Calculation:   R Axis:      Text Interpretation:           Imaging Studies ordered: I ordered imaging studies including CXR CTPE I independently visualized the following imaging with scope of interpretation limited to determining acute life threatening conditions related to emergency care; findings noted above, significant for chf/pna I independently visualized and interpreted imaging. I agree with the radiologist interpretation   Medicines ordered and prescription drug management: Meds ordered this encounter  Medications  . albuterol  (VENTOLIN HFA) 108 (90 Base) MCG/ACT inhaler 2 puff  . ipratropium-albuterol (DUONEB) 0.5-2.5 (3) MG/3ML nebulizer solution 3 mL  . albuterol (PROVENTIL) (2.5 MG/3ML) 0.083% nebulizer solution    [place] F: cabinet override  . iohexol (OMNIPAQUE) 350 MG/ML injection 75 mL  . furosemide (LASIX) injection 40 mg  . cefTRIAXone (ROCEPHIN) 2 g in sodium chloride 0.9 % 100 mL IVPB    Order Specific Question:   Antibiotic Indication:    Answer:   CAP  . azithromycin (ZITHROMAX) 500 mg in sodium chloride 0.9 % 250 mL IVPB    Order Specific Question:   Antibiotic Indication:    Answer:   CAP  . ipratropium-albuterol (DUONEB) 0.5-2.5 (3) MG/3ML nebulizer solution 3 mL  . potassium chloride SA (KLOR-CON M) CR tablet 40 mEq    -I have reviewed the patients home medicines and have made adjustments as needed   Consultations Obtained: na   Cardiac Monitoring: The patient was maintained on a cardiac monitor.  I personally viewed and interpreted the cardiac monitored which showed an underlying rhythm of: nsr  Social Determinants of Health:  Diagnosis or treatment significantly limited by social determinants of health: obesity   Reevaluation: After the interventions noted above, I reevaluated the patient and found that they have improved  Co morbidities that complicate the patient evaluation . Past Medical History:  Diagnosis Date  . A-fib (HCC)   . AAA Ultrasound 01/11/2020    AAA [place] 01/11/20:  no abdominal aortic aneurysm, 2.4 cm  . Aortic insufficiency    a. mild-mod by intraop TEE 05/2018 (no aortic stenosis) // Echo 2/21: mild //  Echo 9/22: trivial  . Aortic stenosis    Echo  9/22: mild (mean 16) // Echo 2/21: mild (mean 18.7)  . CAD (coronary artery disease)    a. NSTEMI 05/2018 with multivessel disease -> s/p CABGx4 05/11/18 // Myoview 06/07/20: inf scar w mild peri-infarct ischemia, EF 37; Int Risk  . Cancer (HCC)   . Carotid artery disease (HCC)    PreCABG [place] 05/07/18: Bilat  ICA 1-39  . CKD (chronic kidney disease), stage III (HCC)   . HFimpEF (heart failure with improved EF)    Echocardiogram 01/11/20: EF 45, Gr 3 DD, mild AI, mild AS (mean 18.7 mmHg) // Echocardiogram 08/14/21:  EF 55-60, inf HK, mild LVH, Gr 1 DD, normal RVSF, RVSP 25.3, mild LAE, trivial MR, MAC, trivial AI, mild AS (mean 16 mmHg, Vmax 283 cm/s, DI 0.41)  . History of nephrolithiasis 2004  . Hyperlipidemia   . Hypertension   . IBS (irritable colon syndrome)   . Ischemic cardiomyopathy   . Mitral regurgitation    Echo 9/22: trivial MR  . Postoperative atrial fibrillation (HCC)   . PVC's (premature ventricular contractions)   . Reflux   . Type 2 diabetes mellitus (HCC)       Dispostion: Disposition decision including need for hospitalization was considered, and patient admitted to the hospital.    Final Clinical Impression(s) / ED Diagnoses Final diagnoses:  Dyspnea, unspecified type  Acute congestive heart failure, unspecified heart failure type (HCC)  Atypical pneumonia  Pulmonary fibrosis (HCC)        Sloan Leiter, DO 08/17/23 0025    Sloan Leiter, DO 08/17/23 0031

## 2023-08-16 NOTE — ED Triage Notes (Signed)
Pt with c/o Chest pain and SOB since last night. States he ran out of his Eliquis and hasn't had any in 2-3 days. Pt is on O2 @ 3L [place] at home.

## 2023-08-16 NOTE — Discharge Instructions (Signed)
It was a pleasure caring for you today in the emergency department. ° °Please return to the emergency department for any worsening or worrisome symptoms. ° ° °

## 2023-08-17 ENCOUNTER — Encounter: Payer: Self-pay | Admitting: Hematology

## 2023-08-17 ENCOUNTER — Other Ambulatory Visit (HOSPITAL_COMMUNITY): Payer: Medicare Other

## 2023-08-17 ENCOUNTER — Other Ambulatory Visit (HOSPITAL_COMMUNITY): Payer: Self-pay

## 2023-08-17 ENCOUNTER — Other Ambulatory Visit (HOSPITAL_COMMUNITY): Payer: Self-pay | Admitting: *Deleted

## 2023-08-17 DIAGNOSIS — I2489 Other forms of acute ischemic heart disease: Secondary | ICD-10-CM | POA: Diagnosis present

## 2023-08-17 DIAGNOSIS — I2510 Atherosclerotic heart disease of native coronary artery without angina pectoris: Secondary | ICD-10-CM | POA: Diagnosis present

## 2023-08-17 DIAGNOSIS — Z7901 Long term (current) use of anticoagulants: Secondary | ICD-10-CM | POA: Diagnosis not present

## 2023-08-17 DIAGNOSIS — J84112 Idiopathic pulmonary fibrosis: Secondary | ICD-10-CM | POA: Diagnosis present

## 2023-08-17 DIAGNOSIS — Z951 Presence of aortocoronary bypass graft: Secondary | ICD-10-CM | POA: Diagnosis not present

## 2023-08-17 DIAGNOSIS — K219 Gastro-esophageal reflux disease without esophagitis: Secondary | ICD-10-CM

## 2023-08-17 DIAGNOSIS — I5043 Acute on chronic combined systolic (congestive) and diastolic (congestive) heart failure: Secondary | ICD-10-CM

## 2023-08-17 DIAGNOSIS — Z8249 Family history of ischemic heart disease and other diseases of the circulatory system: Secondary | ICD-10-CM | POA: Diagnosis not present

## 2023-08-17 DIAGNOSIS — I10 Essential (primary) hypertension: Secondary | ICD-10-CM

## 2023-08-17 DIAGNOSIS — E1165 Type 2 diabetes mellitus with hyperglycemia: Secondary | ICD-10-CM | POA: Diagnosis present

## 2023-08-17 DIAGNOSIS — E785 Hyperlipidemia, unspecified: Secondary | ICD-10-CM | POA: Diagnosis present

## 2023-08-17 DIAGNOSIS — E876 Hypokalemia: Secondary | ICD-10-CM

## 2023-08-17 DIAGNOSIS — I130 Hypertensive heart and chronic kidney disease with heart failure and stage 1 through stage 4 chronic kidney disease, or unspecified chronic kidney disease: Secondary | ICD-10-CM | POA: Diagnosis present

## 2023-08-17 DIAGNOSIS — I482 Chronic atrial fibrillation, unspecified: Secondary | ICD-10-CM | POA: Diagnosis present

## 2023-08-17 DIAGNOSIS — N189 Chronic kidney disease, unspecified: Secondary | ICD-10-CM | POA: Diagnosis present

## 2023-08-17 DIAGNOSIS — J9601 Acute respiratory failure with hypoxia: Principal | ICD-10-CM

## 2023-08-17 DIAGNOSIS — Z9981 Dependence on supplemental oxygen: Secondary | ICD-10-CM | POA: Diagnosis not present

## 2023-08-17 DIAGNOSIS — I480 Paroxysmal atrial fibrillation: Secondary | ICD-10-CM | POA: Diagnosis present

## 2023-08-17 DIAGNOSIS — I080 Rheumatic disorders of both mitral and aortic valves: Secondary | ICD-10-CM | POA: Diagnosis present

## 2023-08-17 DIAGNOSIS — E1122 Type 2 diabetes mellitus with diabetic chronic kidney disease: Secondary | ICD-10-CM | POA: Diagnosis present

## 2023-08-17 DIAGNOSIS — I255 Ischemic cardiomyopathy: Secondary | ICD-10-CM | POA: Diagnosis present

## 2023-08-17 DIAGNOSIS — E7800 Pure hypercholesterolemia, unspecified: Secondary | ICD-10-CM

## 2023-08-17 DIAGNOSIS — J8410 Pulmonary fibrosis, unspecified: Secondary | ICD-10-CM | POA: Diagnosis present

## 2023-08-17 DIAGNOSIS — Z79899 Other long term (current) drug therapy: Secondary | ICD-10-CM | POA: Diagnosis not present

## 2023-08-17 DIAGNOSIS — E669 Obesity, unspecified: Secondary | ICD-10-CM | POA: Diagnosis present

## 2023-08-17 DIAGNOSIS — Z1152 Encounter for screening for COVID-19: Secondary | ICD-10-CM | POA: Diagnosis not present

## 2023-08-17 DIAGNOSIS — Z8616 Personal history of COVID-19: Secondary | ICD-10-CM | POA: Diagnosis not present

## 2023-08-17 LAB — GLUCOSE, CAPILLARY
Glucose-Capillary: 197 mg/dL — ABNORMAL HIGH (ref 70–99)
Glucose-Capillary: 158 mg/dL — ABNORMAL HIGH (ref 70–99)
Glucose-Capillary: 185 mg/dL — ABNORMAL HIGH (ref 70–99)
Glucose-Capillary: 165 mg/dL — ABNORMAL HIGH (ref 70–99)
Glucose-Capillary: 215 mg/dL — ABNORMAL HIGH (ref 70–99)

## 2023-08-17 LAB — BLOOD GAS, VENOUS
Acid-Base Excess: 3 mmol/L — ABNORMAL HIGH (ref 0.0–2.0)
Bicarbonate: 27.1 mmol/L (ref 20.0–28.0)
Drawn by: 1517
O2 Saturation: 68.9 %
Patient temperature: 36.6
pCO2, Ven: 38 mmHg — ABNORMAL LOW (ref 44–60)
pH, Ven: 7.46 — ABNORMAL HIGH (ref 7.25–7.43)
pO2, Ven: 36 mmHg (ref 32–45)

## 2023-08-17 LAB — HEMOGLOBIN A1C
Hgb A1c MFr Bld: 8.4 % — ABNORMAL HIGH (ref 4.8–5.6)
Mean Plasma Glucose: 194 mg/dL

## 2023-08-17 LAB — TSH: TSH: 2.004 u[IU]/mL (ref 0.350–4.500)

## 2023-08-17 LAB — PROCALCITONIN: Procalcitonin: <0.1 ng/mL

## 2023-08-17 LAB — TROPONIN I (HIGH SENSITIVITY): Troponin I (High Sensitivity): 26 ng/L — ABNORMAL HIGH (ref <18)

## 2023-08-17 LAB — LACTIC ACID, PLASMA: Lactic Acid, Venous: 2.7 mmol/L (ref 0.5–1.9)

## 2023-08-17 MED ORDER — ACETAMINOPHEN 650 MG RE SUPP: 650.0000 mg | Freq: Four times a day (QID) | RECTAL | Status: DC | PRN

## 2023-08-17 MED ORDER — ALBUTEROL SULFATE (2.5 MG/3ML) 0.083% IN NEBU: 2.5000 mg | INHALATION_SOLUTION | RESPIRATORY_TRACT | Status: DC | PRN

## 2023-08-17 MED ORDER — HYDRALAZINE HCL 20 MG/ML IJ SOLN: 10.0000 mg | INTRAMUSCULAR | Status: DC | PRN

## 2023-08-17 MED ORDER — IPRATROPIUM-ALBUTEROL 0.5-2.5 (3) MG/3ML IN SOLN
3.0000 mL | Freq: Four times a day (QID) | RESPIRATORY_TRACT | Status: DC
  Administered 2023-08-17: 3 mL via RESPIRATORY_TRACT
  Filled 2023-08-17: qty 3

## 2023-08-17 MED ORDER — REVEFENACIN 175 MCG/3ML IN SOLN
175.0000 ug | Freq: Every day | RESPIRATORY_TRACT | Status: DC
  Administered 2023-08-17 – 2023-08-18 (×2): 175 ug via RESPIRATORY_TRACT
  Filled 2023-08-17 (×2): qty 3

## 2023-08-17 MED ORDER — OXYCODONE HCL 5 MG PO TABS: 5.0000 mg | ORAL_TABLET | ORAL | Status: DC | PRN

## 2023-08-17 MED ORDER — MORPHINE SULFATE (PF) 2 MG/ML IV SOLN: 2.0000 mg | INTRAVENOUS | Status: DC | PRN

## 2023-08-17 MED ORDER — GUAIFENESIN 100 MG/5ML PO LIQD: 5.0000 mL | ORAL | Status: DC | PRN

## 2023-08-17 MED ORDER — ONDANSETRON HCL 4 MG/2ML IJ SOLN: 4.0000 mg | Freq: Four times a day (QID) | INTRAMUSCULAR | Status: DC | PRN

## 2023-08-17 MED ORDER — INSULIN ASPART 100 UNIT/ML IJ SOLN
0.0000 [IU] | Freq: Three times a day (TID) | INTRAMUSCULAR | Status: DC
  Administered 2023-08-17 – 2023-08-18 (×4): 3 [IU] via SUBCUTANEOUS

## 2023-08-17 MED ORDER — MELATONIN 3 MG PO TABS
6.0000 mg | ORAL_TABLET | Freq: Every evening | ORAL | Status: DC | PRN
  Administered 2023-08-17: 6 mg via ORAL
  Filled 2023-08-17: qty 2

## 2023-08-17 MED ORDER — INFLUENZA VAC A&B SURF ANT ADJ 0.5 ML IM SUSY: 0.5000 mL | PREFILLED_SYRINGE | INTRAMUSCULAR | Status: DC

## 2023-08-17 MED ORDER — FAMOTIDINE 20 MG PO TABS
20.0000 mg | ORAL_TABLET | Freq: Two times a day (BID) | ORAL | Status: DC
  Administered 2023-08-17 – 2023-08-18 (×3): 20 mg via ORAL
  Filled 2023-08-17 (×3): qty 1

## 2023-08-17 MED ORDER — METOPROLOL TARTRATE 5 MG/5ML IV SOLN: 5.0000 mg | INTRAVENOUS | Status: DC | PRN

## 2023-08-17 MED ORDER — POTASSIUM CHLORIDE CRYS ER 20 MEQ PO TBCR
40.0000 meq | EXTENDED_RELEASE_TABLET | Freq: Once | ORAL | Status: AC
  Administered 2023-08-17: 40 meq via ORAL
  Filled 2023-08-17: qty 2

## 2023-08-17 MED ORDER — PANTOPRAZOLE SODIUM 40 MG PO TBEC
40.0000 mg | DELAYED_RELEASE_TABLET | Freq: Every day | ORAL | Status: DC
  Administered 2023-08-17 – 2023-08-18 (×2): 40 mg via ORAL
  Filled 2023-08-17 (×2): qty 1

## 2023-08-17 MED ORDER — PNEUMOCOCCAL 20-VAL CONJ VACC 0.5 ML IM SUSY: 0.5000 mL | PREFILLED_SYRINGE | INTRAMUSCULAR | Status: DC

## 2023-08-17 MED ORDER — SODIUM CHLORIDE 0.9 % IV SOLN
2.0000 g | Freq: Once | INTRAVENOUS | Status: AC
  Administered 2023-08-17: 2 g via INTRAVENOUS
  Filled 2023-08-17: qty 20

## 2023-08-17 MED ORDER — CHLORHEXIDINE GLUCONATE CLOTH 2 % EX PADS
6.0000 | MEDICATED_PAD | Freq: Every day | CUTANEOUS | Status: DC
  Administered 2023-08-17 – 2023-08-18 (×2): 6 via TOPICAL

## 2023-08-17 MED ORDER — IPRATROPIUM-ALBUTEROL 0.5-2.5 (3) MG/3ML IN SOLN: 3.0000 mL | RESPIRATORY_TRACT | Status: DC | PRN

## 2023-08-17 MED ORDER — SENNOSIDES-DOCUSATE SODIUM 8.6-50 MG PO TABS: 1.0000 | ORAL_TABLET | Freq: Every evening | ORAL | Status: DC | PRN

## 2023-08-17 MED ORDER — ACETAMINOPHEN 325 MG PO TABS: 650.0000 mg | ORAL_TABLET | Freq: Four times a day (QID) | ORAL | Status: DC | PRN

## 2023-08-17 MED ORDER — APIXABAN 5 MG PO TABS
5.0000 mg | ORAL_TABLET | Freq: Two times a day (BID) | ORAL | Status: DC
  Administered 2023-08-17 – 2023-08-18 (×3): 5 mg via ORAL
  Filled 2023-08-17 (×3): qty 1

## 2023-08-17 MED ORDER — ARFORMOTEROL TARTRATE 15 MCG/2ML IN NEBU
15.0000 ug | INHALATION_SOLUTION | Freq: Two times a day (BID) | RESPIRATORY_TRACT | Status: DC
  Administered 2023-08-17 – 2023-08-18 (×3): 15 ug via RESPIRATORY_TRACT
  Filled 2023-08-17 (×3): qty 2

## 2023-08-17 MED ORDER — IPRATROPIUM-ALBUTEROL 0.5-2.5 (3) MG/3ML IN SOLN
3.0000 mL | Freq: Once | RESPIRATORY_TRACT | Status: AC
  Administered 2023-08-17: 3 mL via RESPIRATORY_TRACT
  Filled 2023-08-17: qty 3

## 2023-08-17 MED ORDER — TRAZODONE HCL 50 MG PO TABS: 50.0000 mg | ORAL_TABLET | Freq: Every evening | ORAL | Status: DC | PRN

## 2023-08-17 MED ORDER — POTASSIUM CHLORIDE CRYS ER 20 MEQ PO TBCR
40.0000 meq | EXTENDED_RELEASE_TABLET | ORAL | Status: AC
  Administered 2023-08-17 (×3): 40 meq via ORAL
  Filled 2023-08-17 (×3): qty 2

## 2023-08-17 MED ORDER — ONDANSETRON HCL 4 MG PO TABS: 4.0000 mg | ORAL_TABLET | Freq: Four times a day (QID) | ORAL | Status: DC | PRN

## 2023-08-17 MED ORDER — INSULIN ASPART 100 UNIT/ML IJ SOLN
0.0000 [IU] | Freq: Every day | INTRAMUSCULAR | Status: DC
  Administered 2023-08-17: 2 [IU] via SUBCUTANEOUS

## 2023-08-17 MED ORDER — SODIUM CHLORIDE 0.9 % IV SOLN
500.0000 mg | INTRAVENOUS | Status: AC
  Administered 2023-08-17: 500 mg via INTRAVENOUS
  Filled 2023-08-17: qty 5

## 2023-08-17 MED ORDER — ATORVASTATIN CALCIUM 40 MG PO TABS
40.0000 mg | ORAL_TABLET | Freq: Every day | ORAL | Status: DC
  Administered 2023-08-17: 40 mg via ORAL
  Filled 2023-08-17: qty 1

## 2023-08-17 MED ORDER — FLUTICASONE PROPIONATE 50 MCG/ACT NA SUSP
1.0000 | Freq: Every day | NASAL | Status: DC
  Administered 2023-08-17 – 2023-08-18 (×2): 1 via NASAL
  Filled 2023-08-17: qty 16

## 2023-08-17 MED ORDER — FUROSEMIDE 10 MG/ML IJ SOLN
40.0000 mg | Freq: Two times a day (BID) | INTRAMUSCULAR | Status: DC
  Administered 2023-08-17 – 2023-08-18 (×3): 40 mg via INTRAVENOUS
  Filled 2023-08-17 (×3): qty 4

## 2023-08-17 NOTE — Evaluation (Signed)
Physical Therapy Evaluation Patient Details Name: Alejandro [name] MRN: [id number] DOB: 05/19/1941 Today's Date: 08/17/2023  History of Present Illness  Alejandro [name] is a 82 y.o. male with medical history significant of atrial fibrillation, aortic insufficiency, coronary artery disease, CKD, heart failure, hypertension, hyperlipidemia, type 2 diabetes mellitus, and more presents the ED with a chief complaint of dyspnea.  Patient reports he has had dyspnea for 2 days.  It has been worse since it started.  It is worse with exertion.  He is on 3 L nasal cannula chronically.  They had to increase him to 6 L nasal cannula on the floor.  Patient denies any cough.  He denies orthopnea.  He reports he had a spell like this 2 weeks ago.  He did have some swelling in his legs today but he denies any weight gain.  He did have some chest pain that was substernal today and felt like a dull ache.  Patient reports that he cannot live at home like this where he cannot breathe whenever he tries to move around.  He desperately wants to see pulmonology but has not been able to get an appointment scheduled until September 25.  He is hoping to see the pulmonologist earlier than that.  He was very disappointed to hear that we do not have pulmonology here in this hospital.  We explained that our resources are limited role hospital.  Patient denies any hemoptysis.  He has no other complaints at this time.   Clinical Impression  Pt admitted with above diagnosis. Patient functioning near his baseline but reports difficulty going up and down stairs to gain access to his home and he reports a fall 3 weeks ago. Patient demonstrating dyspnea on exertion and requiring the use of a RW to continue walking back to his room. Patient's mobility limited by his shortness of breathe and patient demonstrates a more unsteady gait with fatigue. Patient agreeable to sitting in recliner at end of session - nursing notified. Pt currently with functional  limitations due to the deficits listed below (see PT Problem List). Pt will benefit from acute skilled PT to increase their independence and safety with mobility to allow discharge.           If plan is discharge home, recommend the following: A little help with walking and/or transfers;A little help with bathing/dressing/bathroom;Help with stairs or ramp for entrance;Assist for transportation;Assistance with cooking/housework   Can travel by private Tax inspector (2 wheels)  Recommendations for Other Services       Functional Status Assessment Patient has had a recent decline in their functional status and demonstrates the ability to make significant improvements in function in a reasonable and predictable amount of time.     Precautions / Restrictions Precautions Precautions: Fall Precaution Comments: patient reports 1 fall 3 weeks ago Restrictions Weight Bearing Restrictions: No      Mobility  Bed Mobility Overal bed mobility: Modified Independent             General bed mobility comments: increased time    Transfers Overall transfer level: Needs assistance Equipment used: None Transfers: Sit to/from Stand, Bed to chair/wheelchair/BSC Sit to Stand: Contact guard assist, Supervision   Step pivot transfers: Contact guard assist       General transfer comment: assistance for safety in an unfamiliar environment and for line management; cues for step sequencing and hand placement with RW use.  Ambulation/Gait Ambulation/Gait assistance: Supervision, Contact guard assist Gait Distance (Feet): 90 Feet Assistive device: None, Rolling walker (2 wheels) Gait Pattern/deviations: Step-through pattern, Decreased step length - right, Decreased step length - left, Decreased stride length, Wide base of support Gait velocity: decreased     General Gait Details: somewhat slow, labored cadence without assistive device; somewhat  slow, labored cadence using RW as patient demonstrated dyspnea on exertion and tended to reach of objects without asistive device; limitedy by fatigue; on 6 LPM through mobility  Stairs            Wheelchair Mobility     Tilt Bed    Modified Rankin (Stroke Patients Only)       Balance Overall balance assessment: History of Falls, Needs assistance   Sitting balance-Leahy Scale: Good Sitting balance - Comments: seated at EOB     Standing balance-Leahy Scale: Fair Standing balance comment: fair without assistive device; fair/good with RW           Pertinent Vitals/Pain Pain Assessment Pain Assessment: No/denies pain    Home Living Family/patient expects to be discharged to:: Private residence Living Arrangements: Non-relatives/Friends Available Help at Discharge: Family;Friend(s);Available PRN/intermittently Type of Home: House Home Access: Stairs to enter Entrance Stairs-Rails: None Entrance Stairs-Number of Steps: 1   Home Layout: One level Home Equipment: Cane - single point;Shower seat      Prior Function Prior Level of Function : History of Falls (last six months);Needs assist       Physical Assist : ADLs (physical);Mobility (physical) Mobility (physical): Stairs ADLs (physical): IADLs Mobility Comments: patient reports he uses SPC on occasion; reports he has begun having difficulty climbing stairs in and out of his home. ADLs Comments: reports his friend typically does the cooking, cleaning and laundry     Extremity/Trunk Assessment   Upper Extremity Assessment Upper Extremity Assessment: Defer to OT evaluation    Lower Extremity Assessment Lower Extremity Assessment: Generalized weakness    Cervical / Trunk Assessment Cervical / Trunk Assessment: Normal  Communication   Communication Communication: No apparent difficulties Cueing Techniques: Verbal cues  Cognition Arousal: Alert Behavior During Therapy: WFL for tasks  assessed/performed Overall Cognitive Status: Within Functional Limits for tasks assessed          General Comments      Exercises     Assessment/Plan    PT Assessment Patient needs continued PT services  PT Problem List Decreased strength;Decreased knowledge of use of DME;Decreased activity tolerance;Decreased balance;Decreased mobility       PT Treatment Interventions DME instruction;Gait training;Stair training;Patient/family education;Functional mobility training;Therapeutic activities;Therapeutic exercise;Balance training    PT Goals (Current goals can be found in the Care Plan section)  Acute Rehab PT Goals Patient Stated Goal: Go to OPPT to get stronger legs for stairs. PT Goal Formulation: With patient Time For Goal Achievement: 08/31/23 Potential to Achieve Goals: Good    Frequency Min 3X/week        AM-PAC PT "6 Clicks" Mobility  Outcome Measure Help needed turning from your back to your side while in a flat bed without using bedrails?: None Help needed moving from lying on your back to sitting on the side of a flat bed without using bedrails?: A Little Help needed moving to and from a bed to a chair (including a wheelchair)?: A Little Help needed standing up from a chair using your arms (e.g., wheelchair or bedside chair)?: A Little Help needed to walk in hospital room?: A Little Help needed climbing 3-5 steps  with a railing? : A Little 6 Click Score: 19    End of Session Equipment Utilized During Treatment: Gait belt Activity Tolerance: Patient limited by fatigue Patient left: in chair;with call bell/phone within reach Nurse Communication: Mobility status PT Visit Diagnosis: Unsteadiness on feet (R26.81);History of falling (Z91.81);Other abnormalities of gait and mobility (R26.89);Muscle weakness (generalized) (M62.81)    Time: 1610-9604 PT Time Calculation (min) (ACUTE ONLY): 27 min   Charges:   PT Evaluation $PT Eval Low Complexity: 1 Low PT  Treatments $Therapeutic Activity: 8-22 mins PT General Charges $$ ACUTE PT VISIT: 1 Visit         Katina Dung. Hartnett-Rands, MS, PT [facility] [phone]  Britta Mccreedy  Hartnett-Rands 08/17/2023, 3:59 PM

## 2023-08-17 NOTE — Progress Notes (Signed)
PROGRESS NOTE    Alejandro [name]  WUJ:[id number] DOB: 1941-05-22 DOA: 08/16/2023 PCP: Roe Rutherford, NP   Brief Narrative:  82 yo with history of A-fib, aortic insufficiency, CAD, CKD, CHF EF 45%, HTN, HLD, chronic hypoxia on 3 L nasal cannula DM2 comes for SOB.  Upon admission noted to be hypoxic requiring 6 L nasal cannula.  Has outpatient pulmonary appointment on September 25.   Assessment & Plan:  Principal Problem:   Acute respiratory failure with hypoxia (HCC) Active Problems:   Essential hypertension   Hyperlipidemia   GERD (gastroesophageal reflux disease)   Uncontrolled type 2 diabetes mellitus with hyperglycemia, without long-term current use of insulin (HCC)   CHF exacerbation (HCC)   Hypokalemia   Atrial fibrillation, chronic (HCC)    Acute respiratory failure with hypoxia (HCC) Idiopathic pulmonary fibrosis - Baseline uses 3 L nasal cannula, now on 6 L nasal cannula.  Supposed to see outpatient pulmonary September 25. - For now I-S/flutter, bronchodilators.  ProCal Neg   Acute on chronic CHF exacerbation (HCC) with reduced EF EF is 45%.  Lasix given in the ER.  Closely monitor. Lasix as tolerated   Essential hypertension Not on any medication prior to arrival.  IV as needed   Hyperlipidemia Statin   GERD (gastroesophageal reflux disease) - Continue Protonix and Pepcid   Atrial fibrillation, chronic (HCC) - Continue Eliquis   Hypokalemia As needed repletion    Uncontrolled type 2 diabetes mellitus with hyperglycemia, without long-term current use of insulin (HCC) Holding home p.o. meds.  Sliding scale and Accu-Cheks   DVT prophylaxis: Eliquis Code Status: Full code Family Communication:  Family at bedside Status is: Inpatient Remains inpatient appropriate because: IV diuretics    Subjective: Doing ok no complaints besides DOE   Examination:  General exam: Appears calm and comfortable  Respiratory system: bibasilar crackles.   Cardiovascular system: S1 & S2 heard, RRR. No JVD, murmurs, rubs, gallops or clicks. No pedal edema. Gastrointestinal system: Abdomen is nondistended, soft and nontender. No organomegaly or masses felt. Normal bowel sounds heard. Central nervous system: Alert and oriented. No focal neurological deficits. Extremities: Symmetric 5 x 5 power. Skin: No rashes, lesions or ulcers Psychiatry: Judgement and insight appear normal. Mood & affect appropriate.      Diet Orders (From admission, onward)     Start     Ordered   08/17/23 0542  Diet Heart Room service appropriate? Yes; Fluid consistency: Thin; Fluid restriction: 1500 mL Fluid  Diet effective now       Question Answer Comment  Room service appropriate? Yes   Fluid consistency: Thin   Fluid restriction: 1500 mL Fluid      08/17/23 0541            Objective: Vitals:   08/17/23 0045 08/17/23 0229 08/17/23 0714 08/17/23 0830  BP: (!) 148/90 137/79  (!) 148/84  Pulse: (!) 103 (!) 52  83  Resp: (!) 26 20    Temp: 97.9 F (36.6 C) 98.2 F (36.8 C)  98.1 F (36.7 C)  TempSrc: Oral Oral  Oral  SpO2: 95% 100% 98% 100%  Weight:    105.7 kg  Height:    5\' 11"  (1.803 m)    Intake/Output Summary (Last 24 hours) at 08/17/2023 0925 Last data filed at 08/17/2023 0847 Gross per 24 hour  Intake 310.3 ml  Output 2100 ml  Net -1789.7 ml   Filed Weights   08/16/23 2159 08/17/23 0830  Weight: 101.2 kg 105.7 kg  Scheduled Meds:  apixaban  5 mg Oral BID   atorvastatin  40 mg Oral QHS   Chlorhexidine Gluconate Cloth  6 each Topical Q0600   famotidine  20 mg Oral BID   fluticasone  1 spray Each Nare Daily   furosemide  40 mg Intravenous BID   [START ON 08/18/2023] influenza vaccine adjuvanted  0.5 mL Intramuscular Tomorrow-1000   insulin aspart  0-15 Units Subcutaneous TID WC   insulin aspart  0-5 Units Subcutaneous QHS   ipratropium-albuterol  3 mL Nebulization Q6H   pantoprazole  40 mg Oral Daily   [START ON 08/18/2023]  pneumococcal 20-valent conjugate vaccine  0.5 mL Intramuscular Tomorrow-1000   Continuous Infusions:  Nutritional status     Body mass index is 32.5 kg/m.  Data Reviewed:   CBC: Recent Labs  Lab 08/16/23 2236  WBC 10.9*  NEUTROABS 7.5  HGB 10.8*  HCT 34.3*  MCV 89.6  PLT 223   Basic Metabolic Panel: Recent Labs  Lab 08/16/23 2236  NA 136  K 3.3*  CL 101  CO2 25  GLUCOSE 228*  BUN 15  CREATININE 0.92  CALCIUM 8.8*   GFR: Estimated Creatinine Clearance: 79.3 mL/min (by C-G formula based on SCr of 0.92 mg/dL). Liver Function Tests: Recent Labs  Lab 08/16/23 2236  AST 15  ALT 13  ALKPHOS 85  BILITOT 0.5  PROT 7.0  ALBUMIN 3.6   No results for input(s): "LIPASE", "AMYLASE" in the last 168 hours. No results for input(s): "AMMONIA" in the last 168 hours. Coagulation Profile: No results for input(s): "INR", "PROTIME" in the last 168 hours. Cardiac Enzymes: No results for input(s): "CKTOTAL", "CKMB", "CKMBINDEX", "TROPONINI" in the last 168 hours. BNP (last 3 results) No results for input(s): "PROBNP" in the last 8760 hours. HbA1C: No results for input(s): "HGBA1C" in the last 72 hours. CBG: Recent Labs  Lab 08/17/23 0224 08/17/23 0755  GLUCAP 197* 158*   Lipid Profile: No results for input(s): "CHOL", "HDL", "LDLCALC", "TRIG", "CHOLHDL", "LDLDIRECT" in the last 72 hours. Thyroid Function Tests: No results for input(s): "TSH", "T4TOTAL", "FREET4", "T3FREE", "THYROIDAB" in the last 72 hours. Anemia Panel: No results for input(s): "VITAMINB12", "FOLATE", "FERRITIN", "TIBC", "IRON", "RETICCTPCT" in the last 72 hours. Sepsis Labs: Recent Labs  Lab 08/17/23 0053  PROCALCITON <0.10  LATICACIDVEN 2.7*    Recent Results (from the past 240 hour(s))  SARS Coronavirus 2 by RT PCR (hospital order, performed in [facility] hospital lab) *cepheid single result test* Anterior Nasal Swab     Status: None   Collection Time: 08/07/23 10:35 AM   Specimen:  Anterior Nasal Swab  Result Value Ref Range Status   SARS Coronavirus 2 by RT PCR NEGATIVE NEGATIVE Final    Comment: (NOTE) SARS-CoV-2 target nucleic acids are NOT DETECTED.  The SARS-CoV-2 RNA is generally detectable in upper and lower respiratory specimens during the acute phase of infection. The lowest concentration of SARS-CoV-2 viral copies this assay can detect is 250 copies / mL. A negative result does not preclude SARS-CoV-2 infection and should not be used as the sole basis for treatment or other patient management decisions.  A negative result may occur with improper specimen collection / handling, submission of specimen other than nasopharyngeal swab, presence of viral mutation(s) within the areas targeted by this assay, and inadequate number of viral copies (<250 copies / mL). A negative result must be combined with clinical observations, patient history, and epidemiological information.  Fact Sheet for Patients:   RoadLapTop.co.za  Fact Sheet for Healthcare Providers: http://kim-miller.com/  This test is not yet approved or  cleared by the [place] FDA and has been authorized for detection and/or diagnosis of SARS-CoV-2 by FDA under an Emergency Use Authorization (EUA).  This EUA will remain in effect (meaning this test can be used) for the duration of the COVID-19 declaration under Section 564(b)(1) of the Act, 21 U.S.C. section 360bbb-3(b)(1), unless the authorization is terminated or revoked sooner.  Performed at [facility], [address] [place], [place] [address]          Radiology Studies: CT Angio Chest PE W and/or Wo Contrast  Result Date: 08/17/2023 CLINICAL DATA:  Shortness of breath since last night. Ran out of Eliquis. EXAM: CT ANGIOGRAPHY CHEST WITH CONTRAST TECHNIQUE: Multidetector CT imaging of the chest was performed using the standard protocol during bolus administration of intravenous  contrast. Multiplanar CT image reconstructions and MIPs were obtained to evaluate the vascular anatomy. RADIATION DOSE REDUCTION: This exam was performed according to the departmental dose-optimization program which includes automated exposure control, adjustment of the mA and/or kV according to patient size and/or use of iterative reconstruction technique. CONTRAST:  75mL OMNIPAQUE IOHEXOL 350 MG/ML SOLN COMPARISON:  Chest radiograph 08/16/2023 and CTA chest 08/07/2023 FINDINGS: Cardiovascular: Negative for acute pulmonary embolism. Cardiomegaly. No pericardial effusion. Coronary artery and aortic atherosclerotic calcification. Right chest wall Port-A-Cath with tip in the low SVC. Sternotomy and CABG. Mediastinum/Nodes: Trachea and esophagus are unremarkable. Unchanged enlarged mediastinal and hilar lymph nodes. For example left lower paratracheal lymph node measures 21 by 19 mm on series 5/image 131. Lungs/Pleura: Bilateral subpleural reticular and ground-glass opacities compatible with interstitial lung disease are similar to prior. There are new bilateral peribronchovascular predominant patchy ground-glass opacities. Mild interlobular septal thickening in the lower lungs. Small bilateral pleural effusions. Upper Abdomen: No acute abnormality. Musculoskeletal: No acute fracture or destructive osseous lesion. Remote right rib fractures. Review of the MIP images confirms the above findings. IMPRESSION: 1. Negative for acute pulmonary embolism. 2. Patchy ground-glass opacities and interlobular septal thickening superimposed on a background of interstitial lung disease. Pulmonary edema is favored though atypical infection could be considered in the appropriate clinical setting. 3. Unchanged enlarged mediastinal and hilar lymph nodes. Aortic Atherosclerosis (ICD10-I70.0). Electronically Signed   By: Minerva Fester M.D.   On: 08/17/2023 00:00   DG Chest Port 1 View  Result Date: 08/16/2023 CLINICAL DATA:  Chest  pain EXAM: PORTABLE CHEST 1 VIEW COMPARISON:  Radiograph and CT 08/07/2023 FINDINGS: Stable cardiomegaly. Sternotomy and CABG. Right chest wall Port-A-Cath tip in the upper SVC. Diffuse bilateral hazy interstitial and airspace opacities greatest in the left mid and lower lung. Small left and trace right pleural effusions. No pneumothorax. IMPRESSION: Findings suggestive of CHF. Electronically Signed   By: Minerva Fester M.D.   On: 08/16/2023 22:26           LOS: 0 days   Time spent= 35 mins    Miguel Rota, MD [facility]  If 7PM-7AM, please contact night-coverage  08/17/2023, 9:25 AM

## 2023-08-17 NOTE — Plan of Care (Signed)

## 2023-08-17 NOTE — Assessment & Plan Note (Signed)
-   Patient has pulmonary edema on CTA chest - Severe dyspnea on exertion - Troponin flat at 23, 26 - Last echo was done at the beginning of this year and shows an ejection fraction of 45-50% with mildly decreased LV function and grade 2 diastolic dysfunction - Lasix given in the ED - Continue Lasix - Reds clip - Monitor intake and output - Continue to monitor on telemetry

## 2023-08-17 NOTE — H&P (Signed)
History and Physical    Patient: Alejandro [name] MWU:[id number] DOB: 22-May-1941 DOA: 08/16/2023 DOS: the patient was seen and examined on 08/17/2023 PCP: Roe Rutherford, NP  Patient coming from: Home  Chief Complaint:  Chief Complaint  Patient presents with   Shortness of Breath   Chest Pain   HPI: Alejandro [name] is a 82 y.o. male with medical history significant of atrial fibrillation, aortic insufficiency, coronary artery disease, CKD, heart failure, hypertension, hyperlipidemia, type 2 diabetes mellitus, and more presents the ED with a chief complaint of dyspnea.  Patient reports he has had dyspnea for 2 days.  It has been worse since it started.  It is worse with exertion.  He is on 3 L nasal cannula chronically.  They had to increase him to 6 L nasal cannula on the floor.  Patient denies any cough.  He denies orthopnea.  He reports he had a spell like this 2 weeks ago.  He did have some swelling in his legs today but he denies any weight gain.  He did have some chest pain that was substernal today and felt like a dull ache.  Patient reports that he cannot live at home like this where he cannot breathe whenever he tries to move around.  He desperately wants to see pulmonology but has not been able to get an appointment scheduled until September 25.  He is hoping to see the pulmonologist earlier than that.  He was very disappointed to hear that we do not have pulmonology here in this hospital.  We explained that our resources are limited role hospital.  Patient denies any hemoptysis.  He has no other complaints at this time.  Patient does not smoke and does not drink.  Patient is full code. Review of Systems: As mentioned in the history of present illness. All other systems reviewed and are negative. Past Medical History:  Diagnosis Date   A-fib [facility])    AAA Ultrasound 01/11/2020    AAA [place] 01/11/20:  no abdominal aortic aneurysm, 2.4 cm   Aortic insufficiency    a. mild-mod by intraop TEE  05/2018 (no aortic stenosis) // Echo 2/21: mild //  Echo 9/22: trivial   Aortic stenosis    Echo 9/22: mild (mean 16) // Echo 2/21: mild (mean 18.7)   CAD (coronary artery disease)    a. NSTEMI 05/2018 with multivessel disease -> s/p CABGx4 05/11/18 // Myoview 06/07/20: inf scar w mild peri-infarct ischemia, EF 37; Int Risk   Cancer (HCC)    Carotid artery disease (HCC)    PreCABG [place] 05/07/18: Bilat ICA 1-39   CKD (chronic kidney disease), stage III (HCC)    HFimpEF (heart failure with improved EF)    Echocardiogram 01/11/20: EF 45, Gr 3 DD, mild AI, mild AS (mean 18.7 mmHg) // Echocardiogram 08/14/21:  EF 55-60, inf HK, mild LVH, Gr 1 DD, normal RVSF, RVSP 25.3, mild LAE, trivial MR, MAC, trivial AI, mild AS (mean 16 mmHg, Vmax 283 cm/s, DI 0.41)   History of nephrolithiasis 2004   Hyperlipidemia    Hypertension    IBS (irritable colon syndrome)    Ischemic cardiomyopathy    Mitral regurgitation    Echo 9/22: trivial MR   Postoperative atrial fibrillation (HCC)    PVC's (premature ventricular contractions)    Reflux    Type 2 diabetes mellitus (HCC)    Past Surgical History:  Procedure Laterality Date   BRONCHIAL NEEDLE ASPIRATION BIOPSY  11/17/2022   Procedure: BRONCHIAL NEEDLE  ASPIRATION BIOPSIES;  Surgeon: Leslye Peer, MD;  Location: [facility] ENDOSCOPY;  Service: Cardiopulmonary;;   CARDIOVERSION     CATARACT EXTRACTION W/PHACO Right 07/13/2023   Procedure: CATARACT EXTRACTION PHACO AND INTRAOCULAR LENS PLACEMENT (IOC) WITH PLACEMENT OF CORTICOSTEROID;  Surgeon: Fabio Pierce, MD;  Location: [facility] ORS;  Service: Ophthalmology;  Laterality: Right;  CDE 17.32   CORONARY ARTERY BYPASS GRAFT N/A 05/11/2018   Procedure: CORONARY ARTERY BYPASS GRAFTING (CABG), on pump, times four, using left internal mammary artery and endoscopically harvested right greater saphenous leg vein.;  Surgeon: Kerin Perna, MD;  Location: [facility] OR;  Service: Open Heart Surgery;  Laterality: N/A;    ESOPHAGOGASTRODUODENOSCOPY  02/04/2012   Procedure: ESOPHAGOGASTRODUODENOSCOPY (EGD);  Surgeon: Malissa Hippo, MD;  Location: [facility] ENDO SUITE;  Service: Endoscopy;  Laterality: N/A;  100   INGUINAL LYMPH NODE BIOPSY Left 12/15/2022   Procedure: INGUINAL LYMPH NODE BIOPSY;  Surgeon: Lewie Chamber, DO;  Location: [facility] ORS;  Service: General;  Laterality: Left;   LEFT HEART CATH AND CORONARY ANGIOGRAPHY N/A 05/04/2018   Procedure: LEFT HEART CATH AND CORONARY ANGIOGRAPHY;  Surgeon: Lyn Records, MD;  Location: [facility] INVASIVE CV LAB;  Service: Cardiovascular;  Laterality: N/A;   NASAL FRACTURE SURGERY     PORTACATH PLACEMENT Right 01/06/2023   Procedure: INSERTION PORT-A-CATH;  Surgeon: Lewie Chamber, DO;  Location: [facility] ORS;  Service: General;  Laterality: Right;   TEE WITHOUT CARDIOVERSION N/A 05/11/2018   Procedure: TRANSESOPHAGEAL ECHOCARDIOGRAM (TEE);  Surgeon: Donata Clay, Theron Arista, MD;  Location: [facility] OR;  Service: Open Heart Surgery;  Laterality: N/A;   VIDEO BRONCHOSCOPY WITH ENDOBRONCHIAL ULTRASOUND Right 11/17/2022   Procedure: VIDEO BRONCHOSCOPY WITH ENDOBRONCHIAL ULTRASOUND;  Surgeon: Leslye Peer, MD;  Location[facility] ENDOSCOPY;  Service: Cardiopulmonary;  Laterality: Right;   Social History:  reports that he has never smoked. He has never used smokeless tobacco. He reports that he does not drink alcohol and does not use drugs.  No Known Allergies  Family History  Problem Relation Age of Onset   Diabetes Mellitus II Mother    Heart attack Father    Heart attack Brother    Heart disease Other    Arthritis Other    Asthma Other    Diabetes Other     Prior to Admission medications   Medication Sig Start Date End Date Taking? Authorizing Provider  acetaminophen (TYLENOL) 500 MG tablet Take 1,000 mg by mouth every 6 (six) hours as needed for moderate pain or headache.    [provider]  albuterol (ACCUNEB) 1.25 MG/3ML nebulizer solution Take 1 ampule by  nebulization every 6 (six) hours as needed for wheezing or shortness of breath. 05/15/23   [provider]  albuterol (VENTOLIN HFA) 108 (90 Base) MCG/ACT inhaler Inhale 2 puffs into the lungs every 6 (six) hours as needed for wheezing or shortness of breath. 09/22/22   [provider]  allopurinol (ZYLOPRIM) 300 MG tablet Take 1 tablet (300 mg total) by mouth daily. 01/05/23   Doreatha Massed, MD  apixaban (ELIQUIS) 5 MG TABS tablet Take 1 tablet (5 mg total) by mouth 2 (two) times daily. 01/08/23   Pappayliou, Santina Evans A, DO  atorvastatin (LIPITOR) 40 MG tablet Take 40 mg by mouth at bedtime.    [provider]  bisacodyl (DULCOLAX) 5 MG EC tablet Take 5 mg by mouth daily as needed for moderate constipation.    [provider]  cyanocobalamin (VITAMIN B12) 1000 MCG tablet Take 1,000 mcg  by mouth daily.    [provider]  cycloSPORINE (RESTASIS) 0.05 % ophthalmic emulsion Place 1 drop into both eyes 2 (two) times daily. 05/29/23   [provider]  docusate sodium (COLACE) 100 MG capsule Take 300 mg by mouth daily as needed for mild constipation.    [provider]  famotidine (PEPCID) 20 MG tablet Take 20 mg by mouth 2 (two) times daily. 06/19/23   [provider]  fluticasone (FLONASE) 50 MCG/ACT nasal spray Place 1 spray into both nostrils daily. 06/24/23   Carnella Guadalajara, PA-C  furosemide (LASIX) 20 MG tablet Take 3 tablets (60 mg total) by mouth daily. 06/30/23 06/29/24  Sherryll Burger, Pratik D, DO  glipiZIDE (GLUCOTROL XL) 10 MG 24 hr tablet Take 10 mg by mouth daily.    [provider]  loratadine (CLARITIN) 10 MG tablet Take 10 mg by mouth daily. 04/25/22   [provider]  Melatonin 10 MG CAPS Take 30 mg by mouth at bedtime as needed (sleep).    [provider]  metFORMIN (GLUCOPHAGE-XR) 750 MG 24 hr tablet Take 1 tablet by mouth 2 (two) times daily with a meal. 01/05/23   [provider]   pantoprazole (PROTONIX) 40 MG tablet Take 40 mg by mouth daily. 02/07/20   [provider]  potassium chloride (MICRO-K) 10 MEQ CR capsule Take 10 mEq by mouth daily.    [provider]    Physical Exam: Vitals:   08/17/23 0015 08/17/23 0042 08/17/23 0045 08/17/23 0229  BP: (!) 154/99  (!) 148/90 137/79  Pulse: (!) 103  (!) 103 (!) 52  Resp: (!) 23  (!) 26 20  Temp:   97.9 F (36.6 C) 98.2 F (36.8 C)  TempSrc:   Oral Oral  SpO2: 92% 93% 95% 100%  Weight:      Height:       1.  General: Patient lying supine in bed,  no acute distress   2. Psychiatric: Alert and oriented x 3, mood and behavior normal for situation, pleasant and cooperative with exam   3. Neurologic: Speech and language are normal, face is symmetric, moves all 4 extremities voluntarily, at baseline without acute deficits on limited exam   4. HEENMT:  Head is atraumatic, normocephalic, pupils reactive to light, neck is supple, trachea is midline, mucous membranes are moist   5. Respiratory : Lungs are clear to auscultation bilaterally without wheezing, rhonchi, rales, no cyanosis, no increase in work of breathing or accessory muscle use   6. Cardiovascular : Heart rate normal, rhythm is regular, significant murmurs, rubs or gallops, positive for peripheral edema, peripheral pulses palpated   7. Gastrointestinal:  Abdomen is soft, nondistended, nontender to palpation bowel sounds active, no masses or organomegaly palpated   8. Skin:  Skin is warm, dry and intact without rashes, acute lesions, or ulcers on limited exam   9.Musculoskeletal:  No acute deformities or trauma, no asymmetry in tone, positive for peripheral edema, peripheral pulses palpated, no tenderness to palpation in the extremities  Data Reviewed: In the ED Temp 98.3, heart rate 85-107, respiratory rate 17-33, blood pressure 148/90 8-165/104 No leukocytosis with a white blood cell count of 10.9, hemoglobin 10.8 Chemistry  reveals a hypokalemia and a hyperglycemia BNP [address], 26 CTA chest shows no pulmonary embolism, positive for pulmonary edema, possible atypical pneumonia - Procalcitonin negative indicating that is not likely of bacterial pneumonia Admission requested for acute respiratory failure with hypoxia  Assessment and Plan: *  Acute respiratory failure with hypoxia (HCC) - Multifactorial - Requires 3 L nasal cannula at baseline - Requiring 6 L nasal cannula now - Secondary to pulmonary fibrosis and CHF exacerbation - Continue treatment as per CHF exacerbation - Patient has an appointment on September 25 to see pulmonologist, but he is hoping to get in sooner if anything can be done to assist with that  Essential hypertension - Currently not on a PTA medication for hypertension - If blood pressure remains elevated consider adding  Hyperlipidemia - Continue statin - Patient reports he has not had this medication for 3 weeks due to the mail order pharmacy not sending it to him - Consult TOC to help sort this out  GERD (gastroesophageal reflux disease) - Continue Protonix and Pepcid  Atrial fibrillation, chronic (HCC) - Continue Eliquis  Hypokalemia - Potassium 3.3 - Anticipated to decrease with Lasix - 40 mEq replace - Check in the a.m.  CHF exacerbation (HCC) - Patient has pulmonary edema on CTA chest - Severe dyspnea on exertion - Troponin flat at 23, 26 - Last echo was done at the beginning of this year and shows an ejection fraction of 45-50% with mildly decreased LV function and grade 2 diastolic dysfunction - Lasix given in the ED - Continue Lasix - Reds clip - Monitor intake and output - Continue to monitor on telemetry  Uncontrolled type 2 diabetes mellitus with hyperglycemia, without long-term current use of insulin (HCC) - Hold glipizide and metformin - Sliding scale coverage -Monitor CBGs      Advance Care Planning:   Code Status: Full Code  Consults:  None at this time  Family Communication: No family at bedside  Severity of Illness: The appropriate patient status for this patient is INPATIENT. Inpatient status is judged to be reasonable and necessary in order to provide the required intensity of service to ensure the patient's safety. The patient's presenting symptoms, physical exam findings, and initial radiographic and laboratory data in the context of their chronic comorbidities is felt to place them at high risk for further clinical deterioration. Furthermore, it is not anticipated that the patient will be medically stable for discharge from the hospital within 2 midnights of admission.   * I certify that at the point of admission it is my clinical judgment that the patient will require inpatient hospital care spanning beyond 2 midnights from the point of admission due to high intensity of service, high risk for further deterioration and high frequency of surveillance required.*  Author: Lilyan Gilford, DO 08/17/2023 5:42 AM  For on call review [email or web address].

## 2023-08-17 NOTE — TOC Initial Note (Signed)
Transition of Care [facility]) - Initial/Assessment Note    Patient Details  Name: Alejandro [name] MRN: [id number] Date of Birth: December 28, 1940  Transition [facility]) CM/SW Contact:    [name], LCSWA Phone Number: 08/17/2023, 1:42 PM  Clinical Narrative:                 Pt is high risk for readmission. TOC consulted twice for medication assistance naand CHF screen. CSW spoke with pts grandson to complete assessment. Pt lives with a friend [name]. Pt is independent in completing his ADLs. Pts family provides transportation to appointments as needed. Pt has not had HH. Pt has a cane to use when needed. Per pts grandson he states pt normally takes medications as prescribed. Pt does not weigh daily or follow a heart healthy diet. TOC to follow.   CSW spoke to [facility] home delivery pharmacy who states that pts last refill was not completed and delivered likely due to high cost of medications. Per pharmacy eloquis cost is $514 for a 100 day supply. CSW spoke with pts grandson about this, he states he is aware that his grandfather states he will not pay for this medication as the copay is so high. CSW updated pharmacy and MD of this and to see if there is something else that could be prescribed.   Expected Discharge Plan: Home/Self Care Barriers to Discharge: Continued Medical Work up   Patient Goals and CMS Choice Patient states their goals for this hospitalization and ongoing recovery are:: return home CMS Medicare.gov Compare Post Acute Care list provided to:: Patient Choice offered to / list presented to : Patient      Expected Discharge Plan and Services In-house Referral: Clinical Social Work Discharge Planning Services: CM Consult   Living arrangements for the past 2 months: Single Family Home                                      Prior Living Arrangements/Services Living arrangements for the past 2 months: Single Family Home Lives with:: Friends Patient language and need  for interpreter reviewed:: Yes Do you feel safe going back to the place where you live?: Yes      Need for Family Participation in Patient Care: Yes (Comment) Care giver support system in place?: Yes (comment) Current home services: DME Criminal Activity/Legal Involvement Pertinent to Current Situation/Hospitalization: No - Comment as needed  Activities of Daily Living Home Assistive Devices/Equipment: Shower chair with back ADL Screening (condition at time of admission) Patient's cognitive ability adequate to safely complete daily activities?: Yes Is the patient deaf or have difficulty hearing?: No Does the patient have difficulty seeing, even when wearing glasses/contacts?: No Does the patient have difficulty concentrating, remembering, or making decisions?: No Patient able to express need for assistance with ADLs?: Yes Does the patient have difficulty dressing or bathing?: No Independently performs ADLs?: Yes (appropriate for developmental age) Does the patient have difficulty walking or climbing stairs?: Yes Weakness of Legs: Both Weakness of Arms/Hands: None  Permission Sought/Granted                  Emotional Assessment Appearance:: Appears stated age Attitude/Demeanor/Rapport: Engaged Affect (typically observed): Accepting   Alcohol / Substance Use: Not Applicable Psych Involvement: No (comment)  Admission diagnosis:  Pulmonary fibrosis (HCC) [J84.10] Atypical pneumonia [J18.9] Acute respiratory failure with hypoxia (HCC) [J96.01] Dyspnea, unspecified type [R06.00] Acute congestive  heart failure, unspecified heart failure type [facility]) [I50.9] Patient Active Problem List   Diagnosis Date Noted   Acute respiratory failure with hypoxia (HCC) 08/17/2023   Hypokalemia 08/17/2023   Atrial fibrillation, chronic (HCC) 08/17/2023   CHF exacerbation (HCC) 06/30/2023   Acute CHF (HCC) 06/29/2023   Acute on chronic diastolic CHF (congestive heart failure) (HCC) 02/14/2023    Paroxysmal atrial fibrillation (HCC) 02/14/2023   Follicular lymphoma of lymph nodes of inguinal region (HCC) 01/06/2023   Follicular lymphoma grade 3a (HCC) 12/23/2022   Inguinal lymphadenopathy 12/15/2022   Mediastinal adenopathy 11/17/2022   Generalized lymphadenopathy 09/16/2022   Headache 10/28/2021   Upper airway cough syndrome 10/28/2021   Hypothermia    Uncontrolled type 2 diabetes mellitus with hyperglycemia, without long-term current use of insulin (HCC) 07/13/2021   (HFimpEF) heart failure with improved EF 07/13/2021   SIRS (systemic inflammatory response syndrome) (HCC) 07/11/2021   Acute metabolic encephalopathy 07/11/2021   S/P CABG x 4 05/11/2018   CKD (chronic kidney disease), stage III (HCC) 05/07/2018   Obesity 05/07/2018   Aortic stenosis 05/07/2018   Non-ST elevation (NSTEMI) myocardial infarction (HCC) 05/07/2018   CAD (coronary artery disease) 05/07/2018   DOE (dyspnea on exertion)    Leukocytosis 05/02/2018   Flash pulmonary edema (HCC) 05/02/2018   Diabetic neuropathy with neurologic complication (HCC) 03/29/2013   Spinal stenosis of lumbar region 03/29/2013   Melena 03/02/2013   GERD (gastroesophageal reflux disease) 09/02/2012   Helicobacter pylori (H. pylori) 09/02/2012   Essential hypertension 01/08/2012   Diabetes mellitus (HCC) 01/08/2012   Hyperlipidemia    PCP:  Roe Rutherford, NP Pharmacy:   [facility] [id number] - [place], [place] - [address] [place] [address] Phone: [phone] Fax: [phone]  [facility] - [place], [place] - [address] [place] [address] Phone: [phone] Fax: [phone]     Social Determinants of Health (SDOH) Social History: SDOH Screenings   Food Insecurity: No Food Insecurity (08/17/2023)  Housing: Low Risk  (08/17/2023)  Transportation Needs: No Transportation Needs  (08/17/2023)  Utilities: Not At Risk (08/17/2023)  Depression (PHQ2-9): Low Risk  (11/12/2018)  Financial Resource Strain: Low Risk  (05/29/2023)   Received from [facility], [facility]  Physical Activity: Unknown (05/29/2023)   Received from [facility], [facility]  Social Connections: Socially Integrated (05/29/2023)   Received from [facility], [facility]  Stress: Stress Concern Present (06/12/2023)   Received from [facility]  Tobacco Use: Low Risk  (08/16/2023)   SDOH Interventions:     Readmission Risk Interventions    08/17/2023    1:40 PM 02/16/2023    2:59 PM  Readmission Risk Prevention Plan  Transportation Screening Complete Complete  HRI or Home Care Consult  Complete  Social Work Consult for Recovery Care Planning/Counseling  Complete  Palliative Care Screening  Not Applicable  Medication Review Ocean[facility]) Complete Complete  HRI or Home Care Consult Complete   SW Recovery Care/Counseling Consult Complete   Palliative Care Screening Not Applicable   Skilled Nursing Facility Not Applicable

## 2023-08-17 NOTE — Plan of Care (Signed)
  Problem: Acute Rehab PT Goals(only PT should resolve) Goal: Patient Will Transfer Sit To/From Stand Outcome: Progressing Flowsheets (Taken 08/17/2023 1604) Patient will transfer sit to/from stand: with supervision Goal: Pt Will Transfer Bed To Chair/Chair To Bed Outcome: Progressing Flowsheets (Taken 08/17/2023 1604) Pt will Transfer Bed to Chair/Chair to Bed: with supervision Goal: Pt Will Ambulate Outcome: Progressing Flowsheets (Taken 08/17/2023 1604) Pt will Ambulate:  100 feet  with least restrictive assistive device  with contact guard assist  with supervision Goal: Pt Will Go Up/Down Stairs Outcome: Progressing Flowsheets (Taken 08/17/2023 1604) Pt will Go Up / Down Stairs:  1-2 stairs  with contact guard assist  with least restrictive assistive device    Britta Mccreedy D. Hartnett-Rands, MS, PT [name] [phone] 08/17/2023

## 2023-08-17 NOTE — Assessment & Plan Note (Signed)
-   Currently not on a PTA medication for hypertension - If blood pressure remains elevated consider adding

## 2023-08-17 NOTE — Assessment & Plan Note (Addendum)
-   Hold glipizide and metformin - Sliding scale coverage -Monitor CBGs

## 2023-08-17 NOTE — Assessment & Plan Note (Signed)
-   Continue statin - Patient reports he has not had this medication for 3 weeks due to the mail order pharmacy not sending it to him - Consult TOC to help sort this out

## 2023-08-17 NOTE — Progress Notes (Signed)
   08/17/23 0800  ReDS Vest / Clip  Station Marker C  Ruler Value 30  ReDS Actual Value  (low quality)

## 2023-08-17 NOTE — Assessment & Plan Note (Signed)
--  Continue Protonix and Pepcid

## 2023-08-17 NOTE — ED Notes (Signed)
ED TO INPATIENT HANDOFF REPORT  ED Nurse Name and Phone #: Manus Gunning, RN   S Name/Age/Gender Alejandro [name] 82 y.o. male Room/Bed: APA01/APA01  Code Status   Code Status: Prior  Home/SNF/Other Home Patient oriented to: self, place, time, and situation Is this baseline? Yes   Triage Complete: Triage complete  Chief Complaint Acute respiratory failure with hypoxia (HCC) [J96.01]  Triage Note Pt with c/o Chest pain and SOB since last night. States he ran out of his Eliquis and hasn't had any in 2-3 days. Pt is on O2 @ 3L [place] at home.    Allergies No Known Allergies  Level of Care/Admitting Diagnosis ED Disposition     ED Disposition  Admit   Condition  --   Comment  Hospital Area: [facility] [[id number]]  Level of Care: Telemetry [5]  Covid Evaluation: Recent COVID positive no isolation required infection day 21-90  Diagnosis: Acute respiratory failure with hypoxia [facility]) [[id number]]  Admitting Physician: Lilyan Gilford [[id number]]  Attending Physician: Lilyan Gilford [[id number]]  Certification:: I certify this patient will need inpatient services for at least 2 midnights  Expected Medical Readiness: 08/19/2023          B Medical/Surgery History Past Medical History:  Diagnosis Date   A-fib (HCC)    AAA Ultrasound 01/11/2020    AAA [place] 01/11/20:  no abdominal aortic aneurysm, 2.4 cm   Aortic insufficiency    a. mild-mod by intraop TEE 05/2018 (no aortic stenosis) // Echo 2/21: mild //  Echo 9/22: trivial   Aortic stenosis    Echo 9/22: mild (mean 16) // Echo 2/21: mild (mean 18.7)   CAD (coronary artery disease)    a. NSTEMI 05/2018 with multivessel disease -> s/p CABGx4 05/11/18 // Myoview 06/07/20: inf scar w mild peri-infarct ischemia, EF 37; Int Risk   Cancer (HCC)    Carotid artery disease (HCC)    PreCABG [place] 05/07/18: Bilat ICA 1-39   CKD (chronic kidney disease), stage III (HCC)    HFimpEF (heart failure with improved EF)    Echocardiogram 01/11/20:  EF 45, Gr 3 DD, mild AI, mild AS (mean 18.7 mmHg) // Echocardiogram 08/14/21:  EF 55-60, inf HK, mild LVH, Gr 1 DD, normal RVSF, RVSP 25.3, mild LAE, trivial MR, MAC, trivial AI, mild AS (mean 16 mmHg, Vmax 283 cm/s, DI 0.41)   History of nephrolithiasis 2004   Hyperlipidemia    Hypertension    IBS (irritable colon syndrome)    Ischemic cardiomyopathy    Mitral regurgitation    Echo 9/22: trivial MR   Postoperative atrial fibrillation (HCC)    PVC's (premature ventricular contractions)    Reflux    Type 2 diabetes mellitus (HCC)    Past Surgical History:  Procedure Laterality Date   BRONCHIAL NEEDLE ASPIRATION BIOPSY  11/17/2022   Procedure: BRONCHIAL NEEDLE ASPIRATION BIOPSIES;  Surgeon: Leslye Peer, MD;  Location: [facility] ENDOSCOPY;  Service: Cardiopulmonary;;   CARDIOVERSION     CATARACT EXTRACTION W/PHACO Right 07/13/2023   Procedure: CATARACT EXTRACTION PHACO AND INTRAOCULAR LENS PLACEMENT (IOC) WITH PLACEMENT OF CORTICOSTEROID;  Surgeon: Fabio Pierce, MD;  Location: [facility] ORS;  Service: Ophthalmology;  Laterality: Right;  CDE 17.32   CORONARY ARTERY BYPASS GRAFT N/A 05/11/2018   Procedure: CORONARY ARTERY BYPASS GRAFTING (CABG), on pump, times four, using left internal mammary artery and endoscopically harvested right greater saphenous leg vein.;  Surgeon: Kerin Perna, MD;  Location: [facility] OR;  Service: Open Heart Surgery;  Laterality: N/A;   ESOPHAGOGASTRODUODENOSCOPY  02/04/2012   Procedure: ESOPHAGOGASTRODUODENOSCOPY (EGD);  Surgeon: Malissa Hippo, MD;  Location: [facility] ENDO SUITE;  Service: Endoscopy;  Laterality: N/A;  100   INGUINAL LYMPH NODE BIOPSY Left 12/15/2022   Procedure: INGUINAL LYMPH NODE BIOPSY;  Surgeon: Lewie Chamber, DO;  Location: [facility] ORS;  Service: General;  Laterality: Left;   LEFT HEART CATH AND CORONARY ANGIOGRAPHY N/A 05/04/2018   Procedure: LEFT HEART CATH AND CORONARY ANGIOGRAPHY;  Surgeon: Lyn Records, MD;  Location: [facility] INVASIVE CV LAB;  Service:  Cardiovascular;  Laterality: N/A;   NASAL FRACTURE SURGERY     PORTACATH PLACEMENT Right 01/06/2023   Procedure: INSERTION PORT-A-CATH;  Surgeon: Lewie Chamber, DO;  Location: [facility] ORS;  Service: General;  Laterality: Right;   TEE WITHOUT CARDIOVERSION N/A 05/11/2018   Procedure: TRANSESOPHAGEAL ECHOCARDIOGRAM (TEE);  Surgeon: Donata Clay, Theron Arista, MD;  Location: [facility] OR;  Service: Open Heart Surgery;  Laterality: N/A;   VIDEO BRONCHOSCOPY WITH ENDOBRONCHIAL ULTRASOUND Right 11/17/2022   Procedure: VIDEO BRONCHOSCOPY WITH ENDOBRONCHIAL ULTRASOUND;  Surgeon: Leslye Peer, MD;  Location: [facility] ENDOSCOPY;  Service: Cardiopulmonary;  Laterality: Right;     A IV Location/Drains/Wounds Patient Lines/Drains/Airways Status     Active Line/Drains/Airways     Name Placement date Placement time Site Days   Implanted Port 01/06/23 Right Chest 01/06/23  0755  Chest  223            Intake/Output Last 24 hours No intake or output data in the 24 hours ending 08/17/23 0100  Labs/Imaging Results for orders placed or performed during the hospital encounter of 08/16/23 (from the past 48 hour(s))  Troponin I (High Sensitivity)     Status: Abnormal   Collection Time: 08/16/23 10:36 PM  Result Value Ref Range   Troponin I (High Sensitivity) 23 (H) <18 ng/L    Comment: (NOTE) Elevated high sensitivity troponin I (hsTnI) values and significant  changes across serial measurements may suggest ACS but many other  chronic and acute conditions are known to elevate hsTnI results.  Refer to the "Links" section for chest pain algorithms and additional  guidance. Performed at [facility], [address], [place], [place] [address]   Comprehensive metabolic panel     Status: Abnormal   Collection Time: 08/16/23 10:36 PM  Result Value Ref Range   Sodium 136 135 - 145 mmol/L   Potassium 3.3 (L) 3.5 - 5.1 mmol/L   Chloride 101 98 - 111 mmol/L   CO2 25 22 - 32 mmol/L   Glucose, Bld 228 (H) 70 - 99 mg/dL     Comment: Glucose reference range applies only to samples taken after fasting for at least 8 hours.   BUN 15 8 - 23 mg/dL   Creatinine, Ser 9.98 0.61 - 1.24 mg/dL   Calcium 8.8 (L) 8.9 - 10.3 mg/dL   Total Protein 7.0 6.5 - 8.1 g/dL   Albumin 3.6 3.5 - 5.0 g/dL   AST 15 15 - 41 U/L   ALT 13 0 - 44 U/L   Alkaline Phosphatase 85 38 - 126 U/L   Total Bilirubin 0.5 0.3 - 1.2 mg/dL   GFR, Estimated >33 >82 mL/min    Comment: (NOTE) Calculated using the CKD-EPI Creatinine Equation (2021)    Anion gap 10 5 - 15    Comment: Performed at [facility], [address], [place], [place] [address]  CBC with Differential     Status: Abnormal   Collection Time: 08/16/23  10:36 PM  Result Value Ref Range   WBC 10.9 (H) 4.0 - 10.5 K/uL   RBC 3.83 (L) 4.22 - 5.81 MIL/uL   Hemoglobin 10.8 (L) 13.0 - 17.0 g/dL   HCT 40.9 (L) 81.1 - 91.4 %   MCV 89.6 80.0 - 100.0 fL   MCH 28.2 26.0 - 34.0 pg   MCHC 31.5 30.0 - 36.0 g/dL   RDW 78.2 95.6 - 21.3 %   Platelets 223 150 - 400 K/uL   nRBC 0.0 0.0 - 0.2 %   Neutrophils Relative % 68 %   Neutro Abs 7.5 1.7 - 7.7 K/uL   Lymphocytes Relative 17 %   Lymphs Abs 1.9 0.7 - 4.0 K/uL   Monocytes Relative 13 %   Monocytes Absolute 1.4 (H) 0.1 - 1.0 K/uL   Eosinophils Relative 1 %   Eosinophils Absolute 0.1 0.0 - 0.5 K/uL   Basophils Relative 0 %   Basophils Absolute 0.0 0.0 - 0.1 K/uL   Immature Granulocytes 1 %   Abs Immature Granulocytes 0.07 0.00 - 0.07 K/uL    Comment: Performed at [facility], [address] [place], [place] [address]  Brain natriuretic peptide     Status: Abnormal   Collection Time: 08/16/23 10:36 PM  Result Value Ref Range   B Natriuretic Peptide 470.0 (H) 0.0 - 100.0 pg/mL    Comment: Performed at [facility], [address] [place], [place] [address]  Troponin I (High Sensitivity)     Status: Abnormal   Collection Time: 08/17/23 12:11 AM  Result Value Ref Range   Troponin I (High Sensitivity) 26 (H) <18 ng/L    Comment:  (NOTE) Elevated high sensitivity troponin I (hsTnI) values and significant  changes across serial measurements may suggest ACS but many other  chronic and acute conditions are known to elevate hsTnI results.  Refer to the "Links" section for chest pain algorithms and additional  guidance. Performed at [facility], [address], [place], [place] [address]    CT Angio Chest PE W and/or Wo Contrast  Result Date: 08/17/2023 CLINICAL DATA:  Shortness of breath since last night. Ran out of Eliquis. EXAM: CT ANGIOGRAPHY CHEST WITH CONTRAST TECHNIQUE: Multidetector CT imaging of the chest was performed using the standard protocol during bolus administration of intravenous contrast. Multiplanar CT image reconstructions and MIPs were obtained to evaluate the vascular anatomy. RADIATION DOSE REDUCTION: This exam was performed according to the departmental dose-optimization program which includes automated exposure control, adjustment of the mA and/or kV according to patient size and/or use of iterative reconstruction technique. CONTRAST:  75mL OMNIPAQUE IOHEXOL 350 MG/ML SOLN COMPARISON:  Chest radiograph 08/16/2023 and CTA chest 08/07/2023 FINDINGS: Cardiovascular: Negative for acute pulmonary embolism. Cardiomegaly. No pericardial effusion. Coronary artery and aortic atherosclerotic calcification. Right chest wall Port-A-Cath with tip in the low SVC. Sternotomy and CABG. Mediastinum/Nodes: Trachea and esophagus are unremarkable. Unchanged enlarged mediastinal and hilar lymph nodes. For example left lower paratracheal lymph node measures 21 by 19 mm on series 5/image 131. Lungs/Pleura: Bilateral subpleural reticular and ground-glass opacities compatible with interstitial lung disease are similar to prior. There are new bilateral peribronchovascular predominant patchy ground-glass opacities. Mild interlobular septal thickening in the lower lungs. Small bilateral pleural effusions. Upper Abdomen: No acute  abnormality. Musculoskeletal: No acute fracture or destructive osseous lesion. Remote right rib fractures. Review of the MIP images confirms the above findings. IMPRESSION: 1. Negative for acute pulmonary embolism. 2. Patchy ground-glass opacities and interlobular septal thickening superimposed on a  background of interstitial lung disease. Pulmonary edema is favored though atypical infection could be considered in the appropriate clinical setting. 3. Unchanged enlarged mediastinal and hilar lymph nodes. Aortic Atherosclerosis (ICD10-I70.0). Electronically Signed   By: Minerva Fester M.D.   On: 08/17/2023 00:00   DG Chest Port 1 View  Result Date: 08/16/2023 CLINICAL DATA:  Chest pain EXAM: PORTABLE CHEST 1 VIEW COMPARISON:  Radiograph and CT 08/07/2023 FINDINGS: Stable cardiomegaly. Sternotomy and CABG. Right chest wall Port-A-Cath tip in the upper SVC. Diffuse bilateral hazy interstitial and airspace opacities greatest in the left mid and lower lung. Small left and trace right pleural effusions. No pneumothorax. IMPRESSION: Findings suggestive of CHF. Electronically Signed   By: Minerva Fester M.D.   On: 08/16/2023 22:26    Pending Labs Unresulted Labs (From admission, onward)     Start     Ordered   08/17/23 0040  Lactic acid, plasma  (Lactic Acid)  Once,   R        08/17/23 0039   08/17/23 0039  Blood gas, venous  Once,   R        08/17/23 0039   08/17/23 0036  Procalcitonin  Add-on,   AD       References:    Procalcitonin Lower Respiratory Tract Infection AND Sepsis Procalcitonin Algorithm   08/17/23 0036            Vitals/Pain Today's Vitals   08/16/23 2315 08/17/23 0015 08/17/23 0042 08/17/23 0045  BP: (!) 150/98 (!) 154/99  (!) 148/90  Pulse: 91 (!) 103  (!) 103  Resp: 17 (!) 23  (!) 26  Temp:    97.9 F (36.6 C)  TempSrc:    Oral  SpO2: 97% 92% 93% 95%  Weight:      Height:      PainSc:        Isolation Precautions No active isolations  Medications Medications   albuterol (VENTOLIN HFA) 108 (90 Base) MCG/ACT inhaler 2 puff (has no administration in time range)  cefTRIAXone (ROCEPHIN) 2 g in sodium chloride 0.9 % 100 mL IVPB (2 g Intravenous New Bag/Given 08/17/23 0033)  azithromycin (ZITHROMAX) 500 mg in sodium chloride 0.9 % 250 mL IVPB (has no administration in time range)  ipratropium-albuterol (DUONEB) 0.5-2.5 (3) MG/3ML nebulizer solution 3 mL (3 mLs Nebulization Given 08/16/23 2256)  albuterol (PROVENTIL) (2.5 MG/3ML) 0.083% nebulizer solution (2.5 mg  Given 08/16/23 2256)  iohexol (OMNIPAQUE) 350 MG/ML injection 75 mL (75 mLs Intravenous Contrast Given 08/16/23 2335)  furosemide (LASIX) injection 40 mg (40 mg Intravenous Given 08/17/23 0012)  ipratropium-albuterol (DUONEB) 0.5-2.5 (3) MG/3ML nebulizer solution 3 mL (3 mLs Nebulization Given 08/17/23 0032)  potassium chloride SA (KLOR-CON M) CR tablet 40 mEq (40 mEq Oral Given 08/17/23 0033)    Mobility Walks at baseline. Recently severe dyspnea with exertion. Has not ambulated in ED.      Focused Assessments Pulmonary Assessment Handoff:  Lung sounds:   O2 Device: Nasal Cannula O2 Flow Rate (L/min): 5 L/min    R Recommendations: See Admitting Provider Note  Report given to:   Additional Notes: 58M presented with ongoing shob recently worse within last 24 hours. Says he has not taken any Eliquis in last 2-3 weeks result of insurance complications. A.fib on cardiac monitor. Imaging suggests CHF exacerbation. Hx of pulmonary fibrosis. 40mg  Furosemide given through port. Ceftriaxone and Azithromycin administered. Urine out ~400cc.   Given option to go home or be admitted. Chose to be admitted to  see if he feels better and speak to social work for help restarting medications.

## 2023-08-17 NOTE — Assessment & Plan Note (Signed)
-   Potassium 3.3 - Anticipated to decrease with Lasix - 40 mEq replace - Check in the a.m.

## 2023-08-17 NOTE — Assessment & Plan Note (Signed)
-   Multifactorial - Requires 3 L nasal cannula at baseline - Requiring 6 L nasal cannula now - Secondary to pulmonary fibrosis and CHF exacerbation - Continue treatment as per CHF exacerbation - Patient has an appointment on September 25 to see pulmonologist, but he is hoping to get in sooner if anything can be done to assist with that

## 2023-08-17 NOTE — Assessment & Plan Note (Signed)
-   Continue Eliquis 

## 2023-08-18 ENCOUNTER — Inpatient Hospital Stay (HOSPITAL_COMMUNITY): Payer: Medicare Other

## 2023-08-18 DIAGNOSIS — J9601 Acute respiratory failure with hypoxia: Secondary | ICD-10-CM | POA: Diagnosis not present

## 2023-08-18 LAB — GLUCOSE, CAPILLARY: Glucose-Capillary: 173 mg/dL — ABNORMAL HIGH (ref 70–99)

## 2023-08-18 LAB — BASIC METABOLIC PANEL
Anion gap: 10 (ref 5–15)
BUN: 12 mg/dL (ref 8–23)
CO2: 25 mmol/L (ref 22–32)
Calcium: 8.4 mg/dL — ABNORMAL LOW (ref 8.9–10.3)
Chloride: 101 mmol/L (ref 98–111)
Creatinine, Ser: 0.86 mg/dL (ref 0.61–1.24)
GFR, Estimated: >60 mL/min (ref >60)
Glucose, Bld: 157 mg/dL — ABNORMAL HIGH (ref 70–99)
Potassium: 3.6 mmol/L (ref 3.5–5.1)
Sodium: 136 mmol/L (ref 135–145)

## 2023-08-18 LAB — CBC
HCT: 31.8 % — ABNORMAL LOW (ref 39.0–52.0)
Hemoglobin: 10.3 g/dL — ABNORMAL LOW (ref 13.0–17.0)
MCH: 29 pg (ref 26.0–34.0)
MCHC: 32.4 g/dL (ref 30.0–36.0)
MCV: 89.6 fL (ref 80.0–100.0)
Platelets: 210 10*3/uL (ref 150–400)
RBC: 3.55 MIL/uL — ABNORMAL LOW (ref 4.22–5.81)
RDW: 15.9 % — ABNORMAL HIGH (ref 11.5–15.5)
WBC: 10 10*3/uL (ref 4.0–10.5)
nRBC: 0 % (ref 0.0–0.2)

## 2023-08-18 LAB — MAGNESIUM: Magnesium: 2 mg/dL (ref 1.7–2.4)

## 2023-08-18 LAB — BRAIN NATRIURETIC PEPTIDE: B Natriuretic Peptide: 412 pg/mL — ABNORMAL HIGH (ref 0.0–100.0)

## 2023-08-18 MED ORDER — METOPROLOL SUCCINATE ER 25 MG PO TB24: 25.0000 mg | ORAL_TABLET | Freq: Every day | ORAL | 0 refills | Status: AC

## 2023-08-18 MED ORDER — APIXABAN 5 MG PO TABS: 5.0000 mg | ORAL_TABLET | Freq: Two times a day (BID) | ORAL | 0 refills | Status: AC

## 2023-08-18 MED ORDER — ENTRESTO 49-51 MG PO TABS: 1.0000 | ORAL_TABLET | Freq: Two times a day (BID) | ORAL | 0 refills | Status: DC

## 2023-08-18 MED ORDER — POLYVINYL ALCOHOL 1.4 % OP SOLN
1.0000 [drp] | OPHTHALMIC | Status: DC | PRN
  Administered 2023-08-18: 1 [drp] via OPHTHALMIC
  Filled 2023-08-18: qty 15

## 2023-08-18 NOTE — Progress Notes (Signed)
OT Cancellation Note  Patient Details Name: ELIAZAR DANKENBRING MRN: [id number] DOB: 1940-12-28   Cancelled Treatment:    Reason Eval/Treat Not Completed: OT screened, no needs identified, will sign off. Pt functioning near baseline for ADLs and functional mobility, discharging today, no OT needs at this time.     Ezra Sites, OTR/L  [phone] 08/18/2023, 11:22 AM

## 2023-08-18 NOTE — Progress Notes (Signed)
*  PRELIMINARY RESULTS* Echocardiogram 2D Echocardiogram has not been performed. Nurse and nursing students in room at 8:15 a.m.  Stacey Drain 08/18/2023, 8:30 AM

## 2023-08-18 NOTE — Progress Notes (Signed)
Physical Therapy Treatment Patient Details Name: Alejandro [name] MRN: [id number] DOB: Oct 23, 1941 Today's Date: 08/18/2023   History of Present Illness Alejandro [name] is a 82 y.o. male with medical history significant of atrial fibrillation, aortic insufficiency, coronary artery disease, CKD, heart failure, hypertension, hyperlipidemia, type 2 diabetes mellitus, and more presents the ED with a chief complaint of dyspnea.  Patient reports he has had dyspnea for 2 days.  It has been worse since it started.  It is worse with exertion.  He is on 3 L nasal cannula chronically.  They had to increase him to 6 L nasal cannula on the floor.  Patient denies any cough.  He denies orthopnea.  He reports he had a spell like this 2 weeks ago.  He did have some swelling in his legs today but he denies any weight gain.  He did have some chest pain that was substernal today and felt like a dull ache.  Patient reports that he cannot live at home like this where he cannot breathe whenever he tries to move around.  He desperately wants to see pulmonology but has not been able to get an appointment scheduled until September 25.  He is hoping to see the pulmonologist earlier than that.  He was very disappointed to hear that we do not have pulmonology here in this hospital.  We explained that our resources are limited role hospital.  Patient denies any hemoptysis.  He has no other complaints at this time.    PT Comments  Pt sitting edge of bed, eager to participate with therapy.  Pt required max cues for UE placement for safety with transfers as tends to pull rather than push.  Pt steady upon standing and ambulated X60  feet before requesting to return to chair due to dyspnea.  Pt ambulated with 3 L of oxygen and monitored O2 sats during.  Sat did not fall below 93 during ambulation but did drop to 89 when returned to chair.  Sat quickly returned to 100% after rest.  Pt sitting in chair preparing for discharge.     If plan is  discharge home, recommend the following: A little help with walking and/or transfers;A little help with bathing/dressing/bathroom;Help with stairs or ramp for entrance;Assist for transportation;Assistance with cooking/housework   Can travel by private vehicle        Equipment Recommendations  Rolling walker (2 wheels)       Precautions / Restrictions Precautions Precautions: Fall Precaution Comments: patient reports 1 fall 3 weeks ago Restrictions Weight Bearing Restrictions: No           Cognition Arousal: Alert Behavior During Therapy: WFL for tasks assessed/performed Overall Cognitive Status: Within Functional Limits for tasks assessed                                                 Pertinent Vitals/Pain Pain Assessment Pain Assessment: No/denies pain     PT Goals (current goals can now be found in the care plan section) Acute Rehab PT Goals Patient Stated Goal: Go to OPPT to get stronger legs for stairs. PT Goal Formulation: With patient Time For Goal Achievement: 08/31/23 Potential to Achieve Goals: Good Progress towards PT goals: Progressing toward goals    Frequency    Min 3X/week       AM-PAC PT "6 Clicks" Mobility  Outcome Measure  Help needed turning from your back to your side while in a flat bed without using bedrails?: None Help needed moving from lying on your back to sitting on the side of a flat bed without using bedrails?: None Help needed moving to and from a bed to a chair (including a wheelchair)?: A Little Help needed standing up from a chair using your arms (e.g., wheelchair or bedside chair)?: A Little Help needed to walk in hospital room?: A Little Help needed climbing 3-5 steps with a railing? : A Little 6 Click Score: 20    End of Session Equipment Utilized During Treatment: Gait belt;Oxygen Activity Tolerance: Patient limited by fatigue Patient left: in chair;with call bell/phone within reach;with  family/visitor present;with chair alarm set Nurse Communication: Mobility status PT Visit Diagnosis: Unsteadiness on feet (R26.81);History of falling (Z91.81);Other abnormalities of gait and mobility (R26.89);Muscle weakness (generalized) (M62.81)     Time: 1005-1020 PT Time Calculation (min) (ACUTE ONLY): 15 min  Charges:    $Gait Training: 8-22 mins PT General Charges $$ ACUTE PT VISIT: 1 Visit                     Lurena Nida, PTA/CLT [facility] Ph: [phone]    Lurena Nida 08/18/2023, 10:59 AM

## 2023-08-18 NOTE — Care Management Important Message (Signed)
Important Message  Patient Details  Name: Alejandro [name] MRN: [id number] Date of Birth: 01/10/1941   Medicare Important Message Given:  N/A - LOS <3 / Initial given by admissions     Corey Harold 08/18/2023, 11:22 AM

## 2023-08-18 NOTE — TOC Transition Note (Signed)
Transition of Care [facility]) - CM/SW Discharge Note   Patient Details  Name: Alejandro [name] MRN: [id number] Date of Birth: [birth year]  [facility] CM/SW Contact:  [name], LCSWA Phone Number: 08/18/2023, 10:46 AM   Clinical Narrative:    CSW spoke with pts grandson about OP PT recommendation. He states this can be sent out but that pt may not actually go. Grandson states pt wanted OP PT last admission then never went when it was time. CSW sent referral and updated Grandson OP PT office will follow up. CSW also relayed grandson's concerns about pt possibly needing anxiety medication to MD. TOC signing off.   Final next level of care: OP Rehab Barriers to Discharge: Barriers Resolved   Patient Goals and CMS Choice CMS Medicare.gov Compare Post Acute Care list provided to:: Patient Choice offered to / list presented to : Patient  Discharge Placement                         Discharge Plan and Services Additional resources added to the After Visit Summary for   In-house Referral: Clinical Social Work Discharge Planning Services: CM Consult                                 Social Determinants of Health (SDOH) Interventions SDOH Screenings   Food Insecurity: No Food Insecurity (08/17/2023)  Housing: Low Risk  (08/17/2023)  Transportation Needs: No Transportation Needs (08/17/2023)  Utilities: Not At Risk (08/17/2023)  Depression (PHQ2-9): Low Risk  (11/12/2018)  Financial Resource Strain: Low Risk  (05/29/2023)   Received from [facility], [facility]  Physical Activity: Unknown (05/29/2023)   Received from [facility], [facility]  Social Connections: Socially Integrated (05/29/2023)   Received from [facility], [facility]  Stress: Stress Concern Present (06/12/2023)   Received from [facility]  Tobacco Use: Low Risk  (08/16/2023)     Readmission Risk Interventions    08/17/2023    1:40 PM 02/16/2023    2:59 PM  Readmission Risk  Prevention Plan  Transportation Screening Complete Complete  HRI or Home Care Consult  Complete  Social Work Consult for Recovery Care Planning/Counseling  Complete  Palliative Care Screening  Not Applicable  Medication Review Ocean[facility]) Complete Complete  HRI or Home Care Consult Complete   SW Recovery Care/Counseling Consult Complete   Palliative Care Screening Not Applicable   Skilled Nursing Facility Not Applicable

## 2023-08-18 NOTE — Hospital Course (Addendum)
  Brief Narrative:  82 yo with history of A-fib, aortic insufficiency, CAD, CKD, CHF EF 45%, HTN, HLD, chronic hypoxia on 3 L nasal cannula DM2 comes for SOB.  Upon admission noted to be hypoxic requiring 6 L nasal cannula.  Has outpatient pulmonary appointment on September 25.  Patient was started on diuretics with significantly improved his symptoms.     Assessment & Plan:  Principal Problem:   Acute respiratory failure with hypoxia (HCC) Active Problems:   Essential hypertension   Hyperlipidemia   GERD (gastroesophageal reflux disease)   Uncontrolled type 2 diabetes mellitus with hyperglycemia, without long-term current use of insulin (HCC)   CHF exacerbation (HCC)   Hypokalemia   Atrial fibrillation, chronic (HCC)    Acute respiratory failure with hypoxia (HCC) Idiopathic pulmonary fibrosis Significantly improved with diuresis.  Has outpatient pulmonary appointment on September 25 which she should keep.   Acute on chronic CHF exacerbation (HCC) with reduced EF, class III EF is 45%.  S today morning appears euvolemic.  Patient would like to restart his Entresto.  Will prescribe him, he follows with outpatient [facility] cardiology and can continue to follow-up with their service.  Will also continue Lasix and potassium supplements.  I will also add Toprol-XL   Essential hypertension Currently on Toprol XL, Entresto, Lasix   Hyperlipidemia Statin   GERD (gastroesophageal reflux disease) - Continue Protonix and Pepcid   Atrial fibrillation, chronic (HCC) - Continue Eliquis, he needs to be compliant with his medications   Hypokalemia As needed repletion     Uncontrolled type 2 diabetes mellitus with hyperglycemia, without long-term current use of insulin (HCC) New home meds     DVT prophylaxis: Eliquis Code Status: Full code Family Communication:  Called Valentina Shaggy.   Status is: Inpatient Remains inpatient appropriate because-may be discharged later today if we are able to  wean him down on oxygen

## 2023-08-18 NOTE — Discharge Summary (Signed)
Physician Discharge Summary  Alejandro [name] ZOX:[id number] DOB: 11-22-1941 DOA: 08/16/2023  PCP: Roe Rutherford, NP  Admit date: 08/16/2023 Discharge date: 08/18/2023  Admitted From: Home Disposition:  Home  Recommendations for Outpatient Follow-up:  Follow up with PCP in 1-2 weeks Please obtain BMP/CBC in one week your next doctors visit.  Restarted His Home Entresto & Eliquis.  Prescribed Toprol-XL Needs follow-up with his outpatient [facility] cardiology Continue home Lasix and potassium supplements Has outpatient follow-up with pulmonary in September 25   Discharge Condition: Stable CODE STATUS: Full code Diet recommendation: Heart healthy    Brief Narrative:  82 yo with history of A-fib, aortic insufficiency, CAD, CKD, CHF EF 45%, HTN, HLD, chronic hypoxia on 3 L nasal cannula DM2 comes for SOB.  Upon admission noted to be hypoxic requiring 6 L nasal cannula.  Has outpatient pulmonary appointment on September 25.  Patient was started on diuretics with significantly improved his symptoms.     Assessment & Plan:  Principal Problem:   Acute respiratory failure with hypoxia (HCC) Active Problems:   Essential hypertension   Hyperlipidemia   GERD (gastroesophageal reflux disease)   Uncontrolled type 2 diabetes mellitus with hyperglycemia, without long-term current use of insulin (HCC)   CHF exacerbation (HCC)   Hypokalemia   Atrial fibrillation, chronic (HCC)    Acute respiratory failure with hypoxia (HCC) Idiopathic pulmonary fibrosis Significantly improved with diuresis.  Has outpatient pulmonary appointment on September 25 which she should keep.   Acute on chronic CHF exacerbation (HCC) with reduced EF, class III EF is 45%.  S today morning appears euvolemic.  Patient would like to restart his Entresto.  Will prescribe him, he follows with outpatient [facility] cardiology and can continue to follow-up with their service.  Will also continue Lasix and potassium supplements.  I  will also add Toprol-XL   Essential hypertension Currently on Toprol XL, Entresto, Lasix   Hyperlipidemia Statin   GERD (gastroesophageal reflux disease) - Continue Protonix and Pepcid   Atrial fibrillation, chronic (HCC) - Continue Eliquis, he needs to be compliant with his medications   Hypokalemia As needed repletion     Uncontrolled type 2 diabetes mellitus with hyperglycemia, without long-term current use of insulin (HCC) New home meds     DVT prophylaxis: Eliquis Code Status: Full code Family Communication:  Called Valentina Shaggy.   Status is: Inpatient Remains inpatient appropriate because-may be discharged later today if we are able to wean him down on oxygen   Discharge Diagnoses:  Principal Problem:   Acute respiratory failure with hypoxia (HCC) Active Problems:   Essential hypertension   Hyperlipidemia   GERD (gastroesophageal reflux disease)   Uncontrolled type 2 diabetes mellitus with hyperglycemia, without long-term current use of insulin (HCC)   CHF exacerbation (HCC)   Hypokalemia   Atrial fibrillation, chronic (HCC)      Consultations: None  Subjective: Feeling well no complaints.  He was feeling back to his baseline  Discharge Exam: Vitals:   08/18/23 0341 08/18/23 0727  BP: 130/77   Pulse: 81   Resp:    Temp: 98.4 F (36.9 C)   SpO2: 100% 98%   Vitals:   08/17/23 2003 08/17/23 2016 08/18/23 0341 08/18/23 0727  BP: (!) 152/91  130/77   Pulse: 89  81   Resp: 20     Temp: 97.9 F (36.6 C)  98.4 F (36.9 C)   TempSrc: Oral  Oral   SpO2: 100% 100% 100% 98%  Weight:   107.3 kg  Height:        General: Pt is alert, awake, not in acute distress Cardiovascular: RRR, S1/S2 +, no rubs, no gallops Respiratory: CTA bilaterally, no wheezing, no rhonchi Abdominal: Soft, NT, ND, bowel sounds + Extremities: no edema, no cyanosis  Discharge Instructions   Allergies as of 08/18/2023   No Known Allergies      Medication List      TAKE these medications    acetaminophen 500 MG tablet Commonly known as: TYLENOL Take 1,000 mg by mouth every 6 (six) hours as needed for moderate pain or headache.   albuterol 108 (90 Base) MCG/ACT inhaler Commonly known as: VENTOLIN HFA Inhale 2 puffs into the lungs every 6 (six) hours as needed for wheezing or shortness of breath.   albuterol 1.25 MG/3ML nebulizer solution Commonly known as: ACCUNEB Take 1 ampule by nebulization every 6 (six) hours as needed for wheezing or shortness of breath.   allopurinol 300 MG tablet Commonly known as: ZYLOPRIM Take 1 tablet (300 mg total) by mouth daily.   aluminum-magnesium hydroxide 200-200 MG/5ML suspension Take 15 mLs by mouth every 6 (six) hours as needed for indigestion.   apixaban 5 MG Tabs tablet Commonly known as: Eliquis Take 1 tablet (5 mg total) by mouth 2 (two) times daily.   atorvastatin 40 MG tablet Commonly known as: LIPITOR Take 40 mg by mouth at bedtime.   cyanocobalamin 1000 MCG tablet Commonly known as: VITAMIN B12 Take 1,000 mcg by mouth daily.   cycloSPORINE 0.05 % ophthalmic emulsion Commonly known as: RESTASIS Place 1 drop into both eyes 2 (two) times daily.   docusate sodium 100 MG capsule Commonly known as: COLACE Take 300 mg by mouth daily as needed for mild constipation.   Entresto 49-51 MG Generic drug: sacubitril-valsartan Take 1 tablet by mouth 2 (two) times daily.   fluticasone 50 MCG/ACT nasal spray Commonly known as: FLONASE Place 1 spray into both nostrils daily.   furosemide 20 MG tablet Commonly known as: Lasix Take 3 tablets (60 mg total) by mouth daily.   glipiZIDE 10 MG 24 hr tablet Commonly known as: GLUCOTROL XL Take 10 mg by mouth daily.   loratadine 10 MG tablet Commonly known as: CLARITIN Take 10 mg by mouth daily.   Melatonin 10 MG Caps Take 30 mg by mouth at bedtime as needed (sleep).   metFORMIN 750 MG 24 hr tablet Commonly known as: GLUCOPHAGE-XR Take 1  tablet by mouth 2 (two) times daily with a meal.   metoprolol succinate 25 MG 24 hr tablet Commonly known as: Toprol XL Take 1 tablet (25 mg total) by mouth daily.   potassium chloride 10 MEQ CR capsule Commonly known as: MICRO-K Take 10 mEq by mouth daily.        Follow-up Information     Call  Roe Rutherford, NP.   Specialty: Adult Health Nurse Practitioner Why: Follow up from ER visit Contact information: [address] [place] [address] [phone]         Go to  [facility] Emergency Department at [facility].   Specialty: Emergency Medicine Why: As needed, If symptoms worsen Contact information: [address] [place] [address] [phone]               No Known Allergies  You were cared for by a hospitalist during your hospital stay. If you have any questions about your discharge medications or the care you received while you  were in the hospital after you are discharged, you can call the unit and asked to speak with the hospitalist on call if the hospitalist that took care of you is not available. Once you are discharged, your primary care physician will handle any further medical issues. Please note that no refills for any discharge medications will be authorized once you are discharged, as it is imperative that you return to your primary care physician (or establish a relationship with a primary care physician if you do not have one) for your aftercare needs so that they can reassess your need for medications and monitor your lab values.  You were cared for by a hospitalist during your hospital stay. If you have any questions about your discharge medications or the care you received while you were in the hospital after you are discharged, you can call the unit and asked to speak with the hospitalist on call if the hospitalist that took care of you is not available. Once you are discharged, your primary care physician  will handle any further medical issues. Please note that NO REFILLS for any discharge medications will be authorized once you are discharged, as it is imperative that you return to your primary care physician (or establish a relationship with a primary care physician if you do not have one) for your aftercare needs so that they can reassess your need for medications and monitor your lab values.  Please request your Prim.MD to go over all Hospital Tests and Procedure/Radiological results at the follow up, please get all Hospital records sent to your Prim MD by signing hospital release before you go home.  Get CBC, CMP, 2 view Chest X ray checked  by Primary MD during your next visit or SNF MD in 5-7 days ( we routinely change or add medications that can affect your baseline labs and fluid status, therefore we recommend that you get the mentioned basic workup next visit with your PCP, your PCP may decide not to get them or add new tests based on their clinical decision)  On your next visit with your primary care physician please Get Medicines reviewed and adjusted.  If you experience worsening of your admission symptoms, develop shortness of breath, life threatening emergency, suicidal or homicidal thoughts you must seek medical attention immediately by calling 911 or calling your MD immediately  if symptoms less severe.  You Must read complete instructions/literature along with all the possible adverse reactions/side effects for all the Medicines you take and that have been prescribed to you. Take any new Medicines after you have completely understood and accpet all the possible adverse reactions/side effects.   Do not drive, operate heavy machinery, perform activities at heights, swimming or participation in water activities or provide baby sitting services if your were admitted for syncope or siezures until you have seen by Primary MD or a Neurologist and advised to do so again.  Do not drive when  taking Pain medications.   Procedures/Studies: CT Angio Chest PE W and/or Wo Contrast  Result Date: 08/17/2023 CLINICAL DATA:  Shortness of breath since last night. Ran out of Eliquis. EXAM: CT ANGIOGRAPHY CHEST WITH CONTRAST TECHNIQUE: Multidetector CT imaging of the chest was performed using the standard protocol during bolus administration of intravenous contrast. Multiplanar CT image reconstructions and MIPs were obtained to evaluate the vascular anatomy. RADIATION DOSE REDUCTION: This exam was performed according to the departmental dose-optimization program which includes automated exposure control, adjustment of the mA and/or kV according to  patient size and/or use of iterative reconstruction technique. CONTRAST:  75mL OMNIPAQUE IOHEXOL 350 MG/ML SOLN COMPARISON:  Chest radiograph 08/16/2023 and CTA chest 08/07/2023 FINDINGS: Cardiovascular: Negative for acute pulmonary embolism. Cardiomegaly. No pericardial effusion. Coronary artery and aortic atherosclerotic calcification. Right chest wall Port-A-Cath with tip in the low SVC. Sternotomy and CABG. Mediastinum/Nodes: Trachea and esophagus are unremarkable. Unchanged enlarged mediastinal and hilar lymph nodes. For example left lower paratracheal lymph node measures 21 by 19 mm on series 5/image 131. Lungs/Pleura: Bilateral subpleural reticular and ground-glass opacities compatible with interstitial lung disease are similar to prior. There are new bilateral peribronchovascular predominant patchy ground-glass opacities. Mild interlobular septal thickening in the lower lungs. Small bilateral pleural effusions. Upper Abdomen: No acute abnormality. Musculoskeletal: No acute fracture or destructive osseous lesion. Remote right rib fractures. Review of the MIP images confirms the above findings. IMPRESSION: 1. Negative for acute pulmonary embolism. 2. Patchy ground-glass opacities and interlobular septal thickening superimposed on a background of interstitial  lung disease. Pulmonary edema is favored though atypical infection could be considered in the appropriate clinical setting. 3. Unchanged enlarged mediastinal and hilar lymph nodes. Aortic Atherosclerosis (ICD10-I70.0). Electronically Signed   By: Minerva Fester M.D.   On: 08/17/2023 00:00   DG Chest Port 1 View  Result Date: 08/16/2023 CLINICAL DATA:  Chest pain EXAM: PORTABLE CHEST 1 VIEW COMPARISON:  Radiograph and CT 08/07/2023 FINDINGS: Stable cardiomegaly. Sternotomy and CABG. Right chest wall Port-A-Cath tip in the upper SVC. Diffuse bilateral hazy interstitial and airspace opacities greatest in the left mid and lower lung. Small left and trace right pleural effusions. No pneumothorax. IMPRESSION: Findings suggestive of CHF. Electronically Signed   By: Minerva Fester M.D.   On: 08/16/2023 22:26   CT Angio Chest PE W and/or Wo Contrast  Result Date: 08/07/2023 CLINICAL DATA:  Pulmonary embolism (PE) suspected, high prob. Shortness of breath for 2 days. Cough. Hemoptysis. * Tracking Code: BO * EXAM: CT ANGIOGRAPHY CHEST WITH CONTRAST TECHNIQUE: Multidetector CT imaging of the chest was performed using the standard protocol during bolus administration of intravenous contrast. Multiplanar CT image reconstructions and MIPs were obtained to evaluate the vascular anatomy. RADIATION DOSE REDUCTION: This exam was performed according to the departmental dose-optimization program which includes automated exposure control, adjustment of the mA and/or kV according to patient size and/or use of iterative reconstruction technique. CONTRAST:  75mL OMNIPAQUE IOHEXOL 350 MG/ML SOLN COMPARISON:  CT angiography chest from 12/07/2022. FINDINGS: Cardiovascular: No evidence of embolism to the proximal subsegmental pulmonary artery level. Mild cardiomegaly. No pericardial effusion. No aortic aneurysm. There are coronary artery calcifications, in keeping with coronary artery disease. There are also mild peripheral  atherosclerotic vascular calcifications of thoracic aorta and its major branches. Mediastinum/Nodes: Visualized thyroid gland appears grossly unremarkable. No solid / cystic mediastinal masses. The esophagus is nondistended precluding optimal assessment. Redemonstration of multiple enlarged mediastinal and bilateral hilar lymph nodes without significant interval change since the prior study. For example, left lower paratracheal lymph node measures 13 x 20 mm, right lower paratracheal lymph node measures 19 x 24 mm. No significant interval change in size. No axillary lymphadenopathy by size criteria. Lungs/Pleura: The central tracheo-bronchial tree is patent. There is interval progression of peripheral/subpleural reticulations throughout bilateral lungs with lower lobe predominance. There are patchy areas of bronchiectasis. No honeycombing. Findings favor worsening pulmonary fibrosis. There is trace right and small left pleural effusion, new since the prior study. No acute consolidation or lung mass. Upper Abdomen: Visualized upper abdominal viscera within  normal limits. Musculoskeletal: A CT Port-a-Cath is seen in the right upper chest wall with the catheter terminating in the upper portion of superior vena cava. Visualized soft tissues of the chest wall are otherwise grossly unremarkable. No suspicious osseous lesions. There are mild multilevel degenerative changes in the visualized spine. Review of the MIP images confirms the above findings. IMPRESSION: 1. No evidence of pulmonary embolism. 2. Interval progression of peripheral/subpleural reticulations throughout bilateral lungs with lower lobe predominance. Patchy areas of bronchiectasis. No honeycombing. Findings favor worsening pulmonary fibrosis. 3. Trace right and small left pleural effusion, new since the prior study. 4. Stable mediastinal and bilateral hilar lymphadenopathy. 5. Multiple other nonacute observations, as described above. Aortic Atherosclerosis  (ICD10-I70.0). Electronically Signed   By: Jules Schick M.D.   On: 08/07/2023 13:13   DG Chest 2 View  Result Date: 08/07/2023 CLINICAL DATA:  SOB EXAM: CHEST - 2 VIEW COMPARISON:  06/29/2023. FINDINGS: Low lung volume. Redemonstration of prominent interstitial markings, slightly improved when compared to the prior study. There is increasing opacification of left mid lower lung zones. Left lung is grossly clear. There is blunting of right costophrenic angle, similar to the prior study, favoring probable trace right pleural effusion. Evaluation of cardiomediastinal silhouette is nondiagnostic due to left lower hemithorax opacification. However, no significant interval change. There are surgical staples along the heart border and sternotomy wires, status post CABG (coronary artery bypass graft). No acute osseous abnormalities. The soft tissues are within normal limits. Redemonstration of right-sided CT Port-A-Cath with its tip overlying the upper portion of superior vena cava. IMPRESSION: 1. Increasing opacification of the left mid lower lung zones, likely reflecting a combination of pleural effusion and atelectasis. Superimposed infection is not excluded in the appropriate clinical setting. 2. Trace right pleural effusion. Electronically Signed   By: Jules Schick M.D.   On: 08/07/2023 10:07     The results of significant diagnostics from this hospitalization (including imaging, microbiology, ancillary and laboratory) are listed below for reference.     Microbiology: No results found for this or any previous visit (from the past 240 hour(s)).   Labs: BNP (last 3 results) Recent Labs    08/07/23 1031 08/16/23 2236 08/18/23 0544  BNP 450.0* 470.0* 412.0*   Basic Metabolic Panel: Recent Labs  Lab 08/16/23 2236 08/18/23 0544  NA 136 136  K 3.3* 3.6  CL 101 101  CO2 25 25  GLUCOSE 228* 157*  BUN 15 12  CREATININE 0.92 0.86  CALCIUM 8.8* 8.4*  MG  --  2.0   Liver Function  Tests: Recent Labs  Lab 08/16/23 2236  AST 15  ALT 13  ALKPHOS 85  BILITOT 0.5  PROT 7.0  ALBUMIN 3.6   No results for input(s): "LIPASE", "AMYLASE" in the last 168 hours. No results for input(s): "AMMONIA" in the last 168 hours. CBC: Recent Labs  Lab 08/16/23 2236 08/18/23 0544  WBC 10.9* 10.0  NEUTROABS 7.5  --   HGB 10.8* 10.3*  HCT 34.3* 31.8*  MCV 89.6 89.6  PLT 223 210   Cardiac Enzymes: No results for input(s): "CKTOTAL", "CKMB", "CKMBINDEX", "TROPONINI" in the last 168 hours. BNP: Invalid input(s): "POCBNP" CBG: Recent Labs  Lab 08/17/23 0755 08/17/23 1108 08/17/23 1653 08/17/23 2002 08/18/23 0705  GLUCAP 158* 185* 165* 215* 173*   D-Dimer No results for input(s): "DDIMER" in the last 72 hours. Hgb A1c Recent Labs    08/16/23 1037  HGBA1C 8.4*   Lipid Profile No results for input(s): "  CHOL", "HDL", "LDLCALC", "TRIG", "CHOLHDL", "LDLDIRECT" in the last 72 hours. Thyroid function studies Recent Labs    08/17/23 1039  TSH 2.004   Anemia work up No results for input(s): "VITAMINB12", "FOLATE", "FERRITIN", "TIBC", "IRON", "RETICCTPCT" in the last 72 hours. Urinalysis    Component Value Date/Time   COLORURINE YELLOW 12/07/2022 1020   APPEARANCEUR CLEAR 12/07/2022 1020   LABSPEC 1.014 12/07/2022 1020   PHURINE 8.0 12/07/2022 1020   GLUCOSEU 150 (A) 12/07/2022 1020   HGBUR NEGATIVE 12/07/2022 1020   BILIRUBINUR NEGATIVE 12/07/2022 1020   KETONESUR NEGATIVE 12/07/2022 1020   PROTEINUR NEGATIVE 12/07/2022 1020   NITRITE NEGATIVE 12/07/2022 1020   LEUKOCYTESUR NEGATIVE 12/07/2022 1020   Sepsis Labs Recent Labs  Lab 08/16/23 2236 08/18/23 0544  WBC 10.9* 10.0   Microbiology No results found for this or any previous visit (from the past 240 hour(s)).   Time coordinating discharge:  I have spent 35 minutes face to face with the patient and on the ward discussing the patients care, assessment, plan and disposition with other care givers.  >50% of the time was devoted counseling the patient about the risks and benefits of treatment/Discharge disposition and coordinating care.   SIGNED:   Miguel Rota, MD  [facility] 08/18/2023, 10:06 AM   If 7PM-7AM, please contact night-coverage

## 2023-08-26 ENCOUNTER — Institutional Professional Consult (permissible substitution): Payer: Medicare Other | Admitting: Internal Medicine

## 2023-09-14 NOTE — Progress Notes (Unsigned)
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  The patient works in [employer] and works outside on a daily basis.  Apparently, the patient was noted to have some gait instability and altered mental status on 07/11/2021.  His grandson took him home, and the patient went to take a nap.  Apparently, the patient continued to have some dizziness and gait instability.  He had an episode of diaphoresis when he woke up.  As result, the patient was brought to the hospital for further evaluation.  There was concern initially that the patient had not been hydrating well.  The patient did have 2 episodes of nausea and vomiting on the day of admission.  In the emergency department, the patient had SIRS criteria with a temperature of 94.8 F, heart rate in the 40s, WBC 14.2, and lactate 2.0.  Patient was admitted for further evaluation and treatment.  He was started on intravenous antibiotics and IV fluids.  Since then, his mental status has gradually improved.  His vital signs have stabilized.   Discharge Diagnoses:    SIRS -Present on admission -Sepsis ruled out -Presented with hypothermia and leukocytosis with lactate 2.0 -Physiologically improved -Blood cultures remain negative -UA negative for pyuria -Chest x-ray without any infiltrates -Discontinue antibiotics and observe clinically -Lactic acid 2.0>>1.7 -PCT <0.10 x 3   Acute  metabolic encephalopathy -Mental status has improved with hydration and empiric antibiotics -Oral intake has improved -Discontinue IV fluids and IV antibiotics and observe clinically -Serum B12--61>>>given IM inj and start po supplement -TSH--1.399   Chronic systolic and diastolic CHF -Clinically euvolemic -01/11/2020 echo EF 45%, grade 3 DD, moderate MR, mild AS -Continue aspirin, metoprolol, Lipitor   Coronary artery disease status post CABG -No chest pain presently -Continue aspirin and Lipitor -Continue metoprolol tartrate   Lactic acidosis -Sepsis ruled out   Abnormal EKG -Patient presented with junctional rhythm and RBBB -Improved after electrolyte abnormalities and metabolic abnormalities corrected -07/12/2021 EKG--personally reviewed sinus rhythm, nonspecific T wave changes -remained on telemetry without concerning dysrhythmia   Essential hypertension -Continue metoprolol tartrate -Restart hydralazine -Continue lisinopril>>increase to 10 mg daily -Holding HCTZ>>resume after d/c   Uncontrolled diabetes mellitus type 2 with hyperglycemia -07/12/2021 hemoglobin A1c 8.3 -Holding metformin and Jardiance >>resume after d/c -Continue NovoLog sliding scale   Anxiety -His benzodiazepines were held in the setting of his altered mental status at the time of admission   History of Present Illness  09/10/2021  Pulmonary/ 1st office eval/Kazzandra Desaulniers  Chief Complaint  Patient presents with   Consult    Abnormal chest ct. Fatigue, no SOB. Persistent dry cough.   Dyspnea:  progressively worse x 6 m but limited by dizzy /sob about the same time  Cough: dry  raspy cough  x about the same time Sleep: fine 2 pillows  SABA use: none   Rec Stop lisinopril  and start valsartan 80 mg one daily in it's  place and the voice should improve and the cough should go away over several weeks  Recommend paced walking and keep track of your 02 saturation at peak exertion (not after stopping)  -once a  week is enough to see if trending down (above 90% is the goal)  We will walk you slowly around the office today for a baseline to compare to next visit - Be sure to bring your cane     04/29/2022  f/u ov/[place] office/Ronit Marczak re: uacs ? maint on gerd rx / very confused with instructions/ did not bring cane   Chief Complaint  Patient presents with   Follow-up    Believes he has sinus infection.   Dyspnea:  walking to shed  Cough: resolved Nasal obstruction/ seems better as day goes on but face / head feel like busting out x one month Sleeping: bed is flat / on side, 2 pillows  SABA use: ? Helps / only uses p exertion 02: none  Covid status: vax x 3  Rec Pantoprazole (protonix) 40 mg   Take  30-60 min before first meal of the day and Pepcid (famotidine)  20 mg after supper until return to office  Please schedule a follow up office visit in 6 weeks, call sooner if needed with all medications /inhalers/ solutions in hand    11/10/2022  f/u ov/Graviel Payeur re: doe maint on no resp rx/ did not bring meds   Chief Complaint  Patient presents with   Follow-up    Congestion in lungs   Dyspnea:  walking to shed = 300 ft slt elevation coming back to house and stops to recover at landing Cough: none / nasal congestion rx flonase  Sleeping: bed is flat with 2 pillows  SABA use: 4 x daily - last used 2 h prior  02: not using at all all  Rec Prednisone 10 mg take  4 each am x 2 days,   2 each am x 2 days,  1 each am x 2 days and stop  Call if prednisone really helps and we can try symbicort 80   Work on inhaler technique:  Only use your albuterol as a rescue medication Also  Ok to try albuterol 15 min before an activity (on alternating days)  that you know would usually make you short of breath   Please schedule a follow up office visit in 4-6  weeks, sooner if needed [place] office> did not return as rec      09/15/2023  f/u ov/[place] office/Sirenia Whitis re: uacs/ mild UIP  maint on symbicort  /  now on entresto/ did not bring inhalers   Chief Complaint  Patient presents with   Consult  Dyspnea:  worse since early spring with sev admits at [facility] > w/in a week of d/c worse each time with  last dose pred p admit Sept 2024 when could walk to nurses station on 3lpm and now doe room to room  Cough: some assoc sense of pnds/ choking / globus Sleeping: not able to sleep in bed 6-8 weeks due to worse cough/ sob flat >  did not correct the problem in hosp except with elevated bed there SABA use: not sure helping  02: 3lpm 24/7 / not titrating   No obvious day to day or daytime variability or assoc excess/ purulent sputum or mucus plugs or hemoptysis or cp or chest tightness,  or overt  hb symptoms.    Also denies any obvious fluctuation of symptoms with weather or  environmental changes or other aggravating or alleviating factors except as outlined above   No unusual exposure hx or h/o childhood pna/ asthma or knowledge of premature birth.  Current Allergies, Complete Past Medical History, Past Surgical History, Family History, and Social History were reviewed in [employer] record.  ROS  The following are not active complaints unless bolded Hoarseness, sore throat (globus)  dysphagia, dental problems, itching, sneezing,  nasal congestion or sensation of  discharge of excess mucus or purulent secretions, ear ache,   fever, chills, sweats, unintended wt loss or wt gain, classically pleuritic or exertional cp,  orthopnea pnd or arm/hand swelling  or leg swelling, presyncope, palpitations, abdominal pain, anorexia, nausea, vomiting, diarrhea  or change in bowel habits or change in bladder habits, change in stools or change in urine, dysuria, hematuria,  rash, arthralgias, visual complaints, headache, numbness, weakness or ataxia or problems with walking or coordination,  change in mood or  memory.         Current Meds  Medication Sig   acetaminophen (TYLENOL) 500 MG tablet  Take 1,000 mg by mouth every 6 (six) hours as needed for moderate pain or headache.   albuterol (ACCUNEB) 1.25 MG/3ML nebulizer solution Take 1 ampule by nebulization every 6 (six) hours as needed for wheezing or shortness of breath.   albuterol (VENTOLIN HFA) 108 (90 Base) MCG/ACT inhaler Inhale 2 puffs into the lungs every 6 (six) hours as needed for wheezing or shortness of breath.   allopurinol (ZYLOPRIM) 300 MG tablet Take 1 tablet (300 mg total) by mouth daily.   aluminum-magnesium hydroxide 200-200 MG/5ML suspension Take 15 mLs by mouth every 6 (six) hours as needed for indigestion.   apixaban (ELIQUIS) 5 MG TABS tablet Take 1 tablet (5 mg total) by mouth 2 (two) times daily.   atorvastatin (LIPITOR) 40 MG tablet Take 40 mg by mouth at bedtime.   cyanocobalamin (VITAMIN B12) 1000 MCG tablet Take 1,000 mcg by mouth daily.   cycloSPORINE (RESTASIS) 0.05 % ophthalmic emulsion Place 1 drop into both eyes 2 (two) times daily.   docusate sodium (COLACE) 100 MG capsule Take 300 mg by mouth daily as needed for mild constipation.   fluticasone (FLONASE) 50 MCG/ACT nasal spray Place 1 spray into both nostrils daily.   furosemide (LASIX) 20 MG tablet Take 3 tablets (60 mg total) by mouth daily.   glipiZIDE (GLUCOTROL XL) 10 MG 24 hr tablet Take 10 mg by mouth daily.   loratadine (CLARITIN) 10 MG tablet Take 10 mg by mouth daily.   Melatonin 10 MG CAPS Take 30 mg by mouth at bedtime as needed (sleep).   metFORMIN (GLUCOPHAGE-XR) 750 MG 24 hr tablet Take 1 tablet by mouth 2 (two) times daily with a meal.   metoprolol succinate (TOPROL XL) 25 MG 24 hr tablet Take 1 tablet (25 mg total) by mouth daily.   potassium chloride (MICRO-K) 10 MEQ CR capsule Take 10 mEq by mouth daily.   sacubitril-valsartan (ENTRESTO) 49-51 MG Take 1 tablet by mouth 2 (two) times daily.             Past Medical History:  Diagnosis Date   Aortic insufficiency    a. mild-mod by intraop TEE 05/2018 (no aortic stenosis).    CAD (coronary artery disease)    a. NSTEMI 05/2018 with multivessel disease -> s/p CABGx4 05/11/18.   Chronic diastolic CHF (congestive heart failure) (HCC)    a. dx 05/2018.   CKD (chronic kidney disease), stage III (  HCC)    History of nephrolithiasis 2004   Hyperlipidemia    Hypertension    IBS (irritable colon syndrome)    Postoperative atrial fibrillation (HCC)    PVC's (premature ventricular contractions)    Reflux    Type 2 diabetes mellitus (HCC)        Objective:    Wts  09/15/2023     240  11/10/2022     236  04/29/2022       231   10/28/21 230 lb 1.9 oz (104.4 kg)  09/10/21 230 lb 1.9 oz (104.4 kg)  07/22/21 231 lb (104.8 kg)    Vital signs reviewed  09/15/2023  - Note at rest 02 sats  98% on RA   General appearance:    chronically ill w/c bound wm nad  with classic upper airway wheezing     HEENT : Oropharynx  clear      Nasal turbinates nl    NECK :  without  apparent JVD/ palpable Nodes/TM    LUNGS: no acc muscle use,  Nl contour chest with a few insp crackles in bases    CV:  RRR  no s3 or 2/6 SEM s increase in P2, and no edema   ABD:  soft and nontender with nl inspiratory excursion in the supine position. No bruits or organomegaly appreciated   MS:  Nl gait/ ext warm without deformities Or obvious joint restrictions  calf tenderness, cyanosis or clubbing    SKIN: warm and dry without lesions    NEURO:  alert, approp, nl sensorium with  no motor or cerebellar deficits apparent.      I personally reviewed images and agree with radiology impression as follows:   Chest CTa 08/16/23       1. Negative for acute pulmonary embolism. 2. Patchy ground-glass opacities and interlobular septal thickening superimposed on a background of interstitial lung disease. Pulmonary edema is favored though atypical infection could be considered in the appropriate clinical setting. 3. Unchanged enlarged mediastinal and hilar lymph nodes.        Assessment

## 2023-09-15 ENCOUNTER — Encounter: Payer: Self-pay | Admitting: Internal Medicine

## 2023-09-15 ENCOUNTER — Ambulatory Visit (INDEPENDENT_AMBULATORY_CARE_PROVIDER_SITE_OTHER): Payer: Medicare Other | Admitting: Internal Medicine

## 2023-09-15 VITALS — BP 119/65 | HR 82 | Ht 71.0 in | Wt 240.0 lb

## 2023-09-15 DIAGNOSIS — R0609 Other forms of dyspnea: Secondary | ICD-10-CM

## 2023-09-15 DIAGNOSIS — R058 Other specified cough: Secondary | ICD-10-CM

## 2023-09-15 DIAGNOSIS — J9611 Chronic respiratory failure with hypoxia: Secondary | ICD-10-CM | POA: Insufficient documentation

## 2023-09-15 MED ORDER — PANTOPRAZOLE SODIUM 40 MG PO TBEC
40.0000 mg | DELAYED_RELEASE_TABLET | Freq: Every day | ORAL | 2 refills | Status: AC
Start: 2023-09-15 — End: ?

## 2023-09-15 MED ORDER — VALSARTAN 80 MG PO TABS: 80.0000 mg | ORAL_TABLET | Freq: Two times a day (BID) | ORAL | 11 refills | Status: DC

## 2023-09-15 MED ORDER — FAMOTIDINE 20 MG PO TABS
ORAL_TABLET | ORAL | 11 refills | Status: AC
Start: 2023-09-15 — End: ?

## 2023-09-15 NOTE — Assessment & Plan Note (Addendum)
No desats slow walk on 3lpm 09/15/2023   Approp rx   Advised: Make sure you check your oxygen saturation  AT  your highest level of activity (not after you stop)   to be sure it stays over 90% and adjust  02 flow upward to maintain this level if needed but remember to turn it back to previous settings when you stop (to conserve your supply).    Each maintenance medication was reviewed in detail including emphasizing most importantly the difference between maintenance and prns and under what circumstances the prns are to be triggered using an action plan format where appropriate.  Total time for H and P, chart review, counseling, reviewing hfa/02/ pulse ox /neb  device(s) , directly observing portions of ambulatory 02 saturation study/ and generating customized AVS unique to this office visit / same day charting > 40 min for multiple  refractory respiratory  symptoms of uncertain etiology

## 2023-09-15 NOTE — Assessment & Plan Note (Addendum)
Off acei 09/10/21 >  75% improved 10/28/2021 but still am cough - max gerd rx/ bed blocks rec 10/28/2021  - Sinus CT  04/29/2022 not done as rec  - ENT eval 04/29/2022 >>> done 10/30/22 and c/w L TV paralysis, met ca  - 09/15/2023 try off entresto sine so much better previously off acei   Upper airway cough syndrome (previously labeled PNDS),  is so named because it's frequently impossible to sort out how much is  CR/sinusitis with freq throat clearing (which can be related to primary GERD)   vs  causing  secondary (" extra esophageal")  GERD from wide swings in gastric pressure that occur with throat clearing, often  promoting self use of mint and menthol lozenges that reduce the lower esophageal sphincter tone and exacerbate the problem further in a cyclical fashion.   These are the same pts (now being labeled as having "irritable larynx syndrome" by some cough centers) who not infrequently have a history of having failed to tolerate ace inhibitors,  dry powder inhalers or biphosphonates or report having atypical/extraesophageal reflux symptoms(LPR) that don't respond to standard doses of PPI  and are easily confused as having aecopd or asthma flares by even experienced allergists/ pulmonologists (myself included).   Try off entresto and on gerd rx x 6 week trial >>> also so added 6 day taper off  Prednisone starting at 40 mg per day in case of component of Th-2 driven upper or lower airways inflammation (if cough responds short term only to relapse before return while will on full rx for uacs (as above), then  that would point to allergic rhinitis/ asthma or eos bronchitis as alternative dx)   >>> 6 week ov  with all meds in hand using a trust but verify approach to confirm accurate Medication  Reconciliation The principal here is that until we are certain that the  patients are doing what we've asked, it makes no sense to ask them to do more.

## 2023-09-15 NOTE — Assessment & Plan Note (Signed)
Onset spring 2012 - Echo 08/14/21  mild AS / Mild LAE/ nl R side  - CT chest  08/23/21 ? Mild early PF - trial off acei 09/10/2021 >>> cough 75% better  - 09/10/2021   Walked on RA x  3  lap(s) =  approx 450 @ slow pace,   with lowest 02 sats 97%  At end but doe on the 2nd lap  - 10/28/2021   Walked on RA x  3  lap(s) =  approx 466fg @ moderate  pace, stopped due to end of study with mild sob on last lap and no ataxia noted  with lowest 02 sats 98%   - 04/29/2022   Walked on  RA  x  3  lap(s) =  approx450  ft  @ moderate pace, stopped due to end of study  with lowest 02 sats 90%   - HRCT  09/23/22 Mild pulmonary fibrosis in a pattern with slight apical to basal gradient, featuring irregular peripheral interstitial opacity, septal thickening, some evidence of traction bronchiectasis and minimal subpleural bronchiolectasis in the bilateral lung bases, without clear evidence of honeycombing. Findings are categorized as probable UIP per consensus guidelines: Diagnosis of Idiopathic Pulmonary Fibrosis: - echo 11/19/22 c/w mod AS and Mod LAE - 11/10/2022   Walked on RA  x  1  lap(s) =  approx 250  ft  @  moderately slow pace, stopped due to sob  with lowest 02 sats 96%  - echo 01/2023 G2 diastolic dysfunction with severe LAE and mod AS - 09/15/2023   Walked on 3lpm   x  1  lap(s) =  approx 150  ft  @ slow pace, stopped due to sob  with lowest 02 sats 91%  - 09/15/2023 try change entresto to valsartan   Improvement reported duirng admit is either related to steroids or chf but not from improving heart function is the setting of limiting AS so rec   Try back off entresto since tol acei so poorly and just use valsartan titrate to same bp and follow serial bnp / bun/ creat and clinical improvement   Comment:  The sacubitrilat component of entresto is not and ACEi but it does lead to higher levels of bradykinin (the culprit in ACEi related cough) because it reduces Neprilysin based clearance of  bradykinin.The typical symptoms are dry daytime cough (9% per PI) or complaints of a new sensation of globus or excess PNDS (as is the case here)

## 2023-09-15 NOTE — Patient Instructions (Addendum)
Plan A = Automatic = Always=    Symbicort Take 2 puffs first thing in am and then another 2 puffs about 12 hours later.    Work on inhaler technique:  relax and gently blow all the way out then take a nice smooth full deep breath back in, triggering the inhaler at same time you start breathing in.  Hold breath in for at least  5 seconds if you can. Blow out symbicort  thru nose. Rinse and gargle with water when done.  If mouth or throat bother you at all,  try brushing teeth/gums/tongue with arm and hammer toothpaste/ make a slurry and gargle and spit out.     Plan B = Backup (to supplement plan A, not to replace it) Only use your albuterol inhaler as a rescue medication to be used if you can't catch your breath by resting or doing a relaxed purse lip breathing pattern.  - The less you use it, the better it will work when you need it. - Ok to use the inhaler up to 2 puffs  every 4 hours if you must but call for appointment if use goes up over your usual need - Don't leave home without it !!  (think of it like the spare tire for your car)   Plan C = Crisis (instead of Plan B but only if Plan B stops working) - only use your albuterol nebulizer if you first try Plan B and it fails to help > ok to use the nebulizer up to every 4 hours but if start needing it regularly call for immediate appointment    Pantoprazole (protonix) 40 mg   Take  30-60 min before first meal of the day and Pepcid (famotidine)  20 mg after supper until return to office - this is the best way to tell whether stomach acid is contributing to your problem.    Stop entresto and replace  with valsartan 80 mg twice daily (can break in half or double dose but target bp is around 120 /60)  Prednisone 10 mg take  4 each am x 2 days,   2 each am x 2 days,  1 each am x 2 days and stop    Please remember to go to the lab department   for your tests - we will call you with the results when they are available.      Please schedule a  follow up office visit in 6 weeks, call sooner if needed with all medications /inhalers/ solutions in hand so we can verify exactly what you are taking. This includes all medications from all doctors and over the counters - PLEASE separate them into two bags:  the ones you take automatically, no matter what, vs the ones you take just when you feel you need them "BAG #2 is UP TO YOU"  - this will really help [place] help you take your medications more effectively.

## 2023-09-16 ENCOUNTER — Ambulatory Visit (HOSPITAL_COMMUNITY): Payer: Medicare Other

## 2023-09-16 ENCOUNTER — Telehealth: Payer: Self-pay | Admitting: Internal Medicine

## 2023-09-16 ENCOUNTER — Encounter: Payer: Self-pay | Admitting: Hematology

## 2023-09-16 LAB — CBC WITH DIFFERENTIAL/PLATELET
Basophils Absolute: 0 10*3/uL (ref 0.0–0.2)
Basos: 0 %
EOS (ABSOLUTE): 0.1 10*3/uL (ref 0.0–0.4)
Eos: 0 %
Hematocrit: 36.1 % — ABNORMAL LOW (ref 37.5–51.0)
Hemoglobin: 11.7 g/dL — ABNORMAL LOW (ref 13.0–17.7)
Immature Grans (Abs): 0 10*3/uL (ref 0.0–0.1)
Immature Granulocytes: 0 %
Lymphocytes Absolute: 1.4 10*3/uL (ref 0.7–3.1)
Lymphs: 13 %
MCH: 28.1 pg (ref 26.6–33.0)
MCHC: 32.4 g/dL (ref 31.5–35.7)
MCV: 87 fL (ref 79–97)
Monocytes Absolute: 1.3 10*3/uL — ABNORMAL HIGH (ref 0.1–0.9)
Monocytes: 11 %
Neutrophils Absolute: 8.6 10*3/uL — ABNORMAL HIGH (ref 1.4–7.0)
Neutrophils: 76 %
Platelets: 264 10*3/uL (ref 150–450)
RBC: 4.17 x10E6/uL (ref 4.14–5.80)
RDW: 14.2 % (ref 11.6–15.4)
WBC: 11.4 10*3/uL — ABNORMAL HIGH (ref 3.4–10.8)

## 2023-09-16 LAB — BASIC METABOLIC PANEL
BUN/Creatinine Ratio: 16 (ref 10–24)
BUN: 17 mg/dL (ref 8–27)
CO2: 23 mmol/L (ref 20–29)
Calcium: 9.2 mg/dL (ref 8.6–10.2)
Chloride: 100 mmol/L (ref 96–106)
Creatinine, Ser: 1.05 mg/dL (ref 0.76–1.27)
Glucose: 156 mg/dL — ABNORMAL HIGH (ref 70–99)
Potassium: 4.1 mmol/L (ref 3.5–5.2)
Sodium: 141 mmol/L (ref 134–144)
eGFR: 72 mL/min/{1.73_m2} (ref >59)

## 2023-09-16 LAB — SEDIMENTATION RATE: Sed Rate: 17 mm/h (ref 0–30)

## 2023-09-16 LAB — BRAIN NATRIURETIC PEPTIDE: BNP: 266.2 pg/mL — ABNORMAL HIGH (ref 0.0–100.0)

## 2023-09-16 MED ORDER — PREDNISONE 10 MG PO TABS: ORAL_TABLET | ORAL | 0 refills | Status: AC

## 2023-09-16 NOTE — Telephone Encounter (Signed)
Rx sent based on OV notes from 09/15/23.

## 2023-09-16 NOTE — Telephone Encounter (Signed)
Patient was here yesterday to see Dr. Sherene Sires and was told he was to start taking Prednisone--the prescription was not received at the [facility] on [name].  ( The instructions are on the AVS from 09/15/23)  Please call [phone]

## 2023-09-18 ENCOUNTER — Telehealth: Payer: Self-pay

## 2023-09-18 ENCOUNTER — Telehealth: Payer: Self-pay | Admitting: Internal Medicine

## 2023-09-18 NOTE — Telephone Encounter (Signed)
Calling to see about getting the patient a bed

## 2023-09-18 NOTE — Telephone Encounter (Signed)
I left a message for the patient to return my call.

## 2023-09-18 NOTE — Telephone Encounter (Signed)
-----   Message from Alejandro [name] sent at 09/17/2023  5:38 AM EDT ----- Call patient :  Studies are unremarkable, no change in recs

## 2023-09-18 NOTE — Telephone Encounter (Signed)
Ms. Alejandro [name] is returning your call regarding lab results for Alejandro [name] call [phone]

## 2023-09-18 NOTE — Telephone Encounter (Signed)
Tried to return patient call.

## 2023-09-23 NOTE — Telephone Encounter (Signed)
Alejandro [name], CMA 09/18/2023  2:06 PM EDT     Called and spoke w/ pt regarding his results and no changes to his care.

## 2023-10-06 ENCOUNTER — Other Ambulatory Visit (HOSPITAL_COMMUNITY): Payer: Self-pay | Admitting: Adult Health Nurse Practitioner

## 2023-10-06 ENCOUNTER — Ambulatory Visit (HOSPITAL_COMMUNITY)
Admission: RE | Admit: 2023-10-06 | Discharge: 2023-10-06 | Disposition: A | Discharge status: Home / Self Care | Payer: Medicare Other | Source: Ambulatory Visit | Attending: Adult Health Nurse Practitioner | Admitting: Adult Health Nurse Practitioner

## 2023-10-06 DIAGNOSIS — I5033 Acute on chronic diastolic (congestive) heart failure: Secondary | ICD-10-CM | POA: Diagnosis present

## 2023-10-13 ENCOUNTER — Ambulatory Visit: Payer: Medicare Other | Admitting: Internal Medicine

## 2023-11-10 ENCOUNTER — Ambulatory Visit (HOSPITAL_COMMUNITY): Payer: Medicare Other

## 2023-11-10 ENCOUNTER — Inpatient Hospital Stay: Payer: PPO

## 2023-11-11 ENCOUNTER — Other Ambulatory Visit: Payer: Self-pay

## 2023-11-17 ENCOUNTER — Inpatient Hospital Stay: Payer: PPO | Admitting: Hematology

## 2023-11-30 ENCOUNTER — Ambulatory Visit (HOSPITAL_COMMUNITY)
Admission: RE | Admit: 2023-11-30 | Discharge: 2023-11-30 | Disposition: A | Discharge status: Home / Self Care | Payer: Medicare Other | Source: Ambulatory Visit | Attending: Hematology | Admitting: Hematology

## 2023-11-30 ENCOUNTER — Inpatient Hospital Stay: Payer: PPO

## 2023-11-30 ENCOUNTER — Inpatient Hospital Stay: Payer: Medicare Other | Attending: Hematology

## 2023-11-30 DIAGNOSIS — C8230 Follicular lymphoma grade IIIa, unspecified site: Secondary | ICD-10-CM | POA: Insufficient documentation

## 2023-11-30 DIAGNOSIS — C8238 Follicular lymphoma grade IIIa, lymph nodes of multiple sites: Secondary | ICD-10-CM | POA: Insufficient documentation

## 2023-11-30 DIAGNOSIS — D649 Anemia, unspecified: Secondary | ICD-10-CM | POA: Insufficient documentation

## 2023-11-30 LAB — CBC WITH DIFFERENTIAL/PLATELET
Abs Immature Granulocytes: 0.09 10*3/uL — ABNORMAL HIGH (ref 0.00–0.07)
Basophils Absolute: 0 10*3/uL (ref 0.0–0.1)
Basophils Relative: 0 %
Eosinophils Absolute: 0 10*3/uL (ref 0.0–0.5)
Eosinophils Relative: 0 %
HCT: 37.4 % — ABNORMAL LOW (ref 39.0–52.0)
Hemoglobin: 12.4 g/dL — ABNORMAL LOW (ref 13.0–17.0)
Immature Granulocytes: 1 %
Lymphocytes Relative: 16 %
Lymphs Abs: 1.7 10*3/uL (ref 0.7–4.0)
MCH: 29.2 pg (ref 26.0–34.0)
MCHC: 33.2 g/dL (ref 30.0–36.0)
MCV: 88 fL (ref 80.0–100.0)
Monocytes Absolute: 1.1 10*3/uL — ABNORMAL HIGH (ref 0.1–1.0)
Monocytes Relative: 10 %
Neutro Abs: 8.1 10*3/uL — ABNORMAL HIGH (ref 1.7–7.7)
Neutrophils Relative %: 73 %
Platelets: 210 10*3/uL (ref 150–400)
RBC: 4.25 MIL/uL (ref 4.22–5.81)
RDW: 16.4 % — ABNORMAL HIGH (ref 11.5–15.5)
WBC: 11 10*3/uL — ABNORMAL HIGH (ref 4.0–10.5)
nRBC: 0 % (ref 0.0–0.2)

## 2023-11-30 LAB — COMPREHENSIVE METABOLIC PANEL
ALT: 23 U/L (ref 0–44)
AST: 21 U/L (ref 15–41)
Albumin: 3.7 g/dL (ref 3.5–5.0)
Alkaline Phosphatase: 88 U/L (ref 38–126)
Anion gap: 10 (ref 5–15)
BUN: 15 mg/dL (ref 8–23)
CO2: 25 mmol/L (ref 22–32)
Calcium: 8.9 mg/dL (ref 8.9–10.3)
Chloride: 101 mmol/L (ref 98–111)
Creatinine, Ser: 1.02 mg/dL (ref 0.61–1.24)
GFR, Estimated: >60 mL/min (ref >60)
Glucose, Bld: 210 mg/dL — ABNORMAL HIGH (ref 70–99)
Potassium: 3.5 mmol/L (ref 3.5–5.1)
Sodium: 136 mmol/L (ref 135–145)
Total Bilirubin: 0.7 mg/dL (ref 0.0–1.2)
Total Protein: 7.3 g/dL (ref 6.5–8.1)

## 2023-11-30 MED ORDER — HEPARIN SOD (PORK) LOCK FLUSH 100 UNIT/ML IV SOLN
500.0000 [IU] | Freq: Once | INTRAVENOUS | Status: AC
  Administered 2023-11-30: 500 [IU] via INTRAVENOUS

## 2023-11-30 MED ORDER — IOHEXOL 300 MG/ML  SOLN
100.0000 mL | Freq: Once | INTRAMUSCULAR | Status: AC | PRN
  Administered 2023-11-30: 100 mL via INTRAVENOUS

## 2023-12-01 ENCOUNTER — Other Ambulatory Visit: Payer: Self-pay

## 2023-12-05 ENCOUNTER — Encounter: Payer: Self-pay | Admitting: Hematology

## 2023-12-07 ENCOUNTER — Other Ambulatory Visit: Payer: Medicare Other

## 2023-12-08 ENCOUNTER — Encounter: Payer: Self-pay | Admitting: Hematology

## 2023-12-08 ENCOUNTER — Inpatient Hospital Stay: Payer: Medicare Other | Admitting: Hematology

## 2023-12-08 ENCOUNTER — Inpatient Hospital Stay: Payer: PPO | Attending: Hematology | Admitting: Hematology

## 2023-12-08 VITALS — BP 144/71 | HR 76 | Temp 97.5°F | Resp 16 | Wt 245.2 lb

## 2023-12-08 DIAGNOSIS — D649 Anemia, unspecified: Secondary | ICD-10-CM | POA: Insufficient documentation

## 2023-12-08 DIAGNOSIS — C8230 Follicular lymphoma grade IIIa, unspecified site: Secondary | ICD-10-CM | POA: Insufficient documentation

## 2023-12-08 DIAGNOSIS — C8238 Follicular lymphoma grade IIIa, lymph nodes of multiple sites: Secondary | ICD-10-CM | POA: Diagnosis not present

## 2023-12-08 NOTE — Progress Notes (Signed)
 [facility] [address] [place] [address]    Clinic Day:  12/08/2023  Referring physician: Suanne Pfeiffer, NP  Patient Care Team: Suanne Pfeiffer, NP as PCP - General (Adult Health Nurse Practitioner) Debera Jayson MATSU, MD as PCP - Cardiology (Cardiology) Rogers Hai, MD as Medical Oncologist (Medical Oncology) Celestia Joesph SQUIBB, RN as Oncology Nurse Navigator (Medical Oncology)   ASSESSMENT & PLAN:   Assessment: 1.  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  No B symptoms. - Reports hoarseness of voice for the last 1 month with occasional choking to liquids. - CT chest (09/03/2022): Interval enlargement of matted appearing pretracheal and right paratracheal lymph node measuring 2.7 x 2.7 cm, previously 1.9 x 1.6 cm (08/23/2021), mild pulmonary fibrosis. - PET/CT scan (09/11/2022): Mildly metabolically lower cervical lymph nodes bilaterally, numerous on the right with SUV 3.7.  Nodule in the deep lobe of the right parotid gland, 1.4 x 1.0 SUV 10.2.  Asymmetric hypermetabolic activity in the right vocal cord, SUV 8.7 with no focal mass.  Right paratracheal mass 3.7 x 2.8 cm SUV 10.6.  10 mm left paratracheal node with SUV 5.5.  Left para-aortic adenopathy 4.5 x 2.4 cm, SUV 12.4.  Additional smaller retroperitoneal and mesenteric lymph nodes mildly hypermetabolic.  10 mm left inguinal node with SUV 4.3.  Hypermetabolic activity along the medial aspect of the left femoral neck SUV 6.8.  No clear bone lesion on the CT images. - Retroperitoneal lymph node biopsy (10/10/2022): Atypical lymphoid proliferation, but concerning for lymphoproliferative disorder. - ENT evaluation (Dr. Jesus) fiberoptic laryngoscopy: Left vocal cord paralysis - Bronchoscopy and biopsy of 4R lymph node (11/17/2022): Suspicious for B-cell lymphoma. - Left inguinal excisional biopsy on 12/15/2022 - Pathology: High-grade  follicular lymphoma, grade 3 - 3 cycles of BR from 01/07/2023 through 03/04/2023 - PET scan on 04/09/2023: Deauville 2 response. - Chemotherapy was held due to severe weakness.   2.  Social/family history: - He lives at home by himself and is independent of all ADLs and IADLs. 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  Non-smoker.  Has secondhand smoke exposure from his wife. - No family history of malignancies.    Plan: 1.  Stage III high-grade follicular lymphoma (grade 3A): - Denies any fevers, night sweats or weight loss in the last 4 months. - Labs from 11/30/2023: Normal LFTs and creatinine.  CBC is stable. - Reviewed CT CAP (11/30/2023): Minimal increase in size of the mediastinal lymph nodes by 2 to 3 mm.  New right axillary lymph node with short axis measuring 7 mm.  No splenomegaly.  Similar bulky confluence nodal tissue in the retroperitoneum wrapping and behind the aorta and between the aorta and IVC measuring up to 16 mm in thickness unchanged. - Also discussed with grandson over the phone. - Recommend follow-up in 6 months with repeat CT CAP and labs.   2.  Normocytic anemia: - Hemoglobin is stable at 12.4.  Will plan on repeating ferritin and iron panel at next visit.     Orders Placed This Encounter  Procedures   CT CHEST ABDOMEN PELVIS W CONTRAST    Standing Status:   Future    Expected Date:   06/06/2024    Expiration Date:   12/07/2024    If indicated for the ordered procedure, I authorize the administration of contrast media per Radiology protocol:   Yes  Does the patient have a contrast media/X-ray dye allergy?:   No    Preferred imaging location?:   [facility]    If indicated for the ordered procedure, I authorize the administration of oral contrast media per Radiology protocol:   Yes   CBC with Differential    Standing Status:   Future    Expected Date:   05/02/2024    Expiration Date:   12/07/2024   Comprehensive metabolic panel     Standing Status:   Future    Expected Date:   05/02/2024    Expiration Date:   12/07/2024   Lactate dehydrogenase    Standing Status:   Future    Expected Date:   05/02/2024    Expiration Date:   12/07/2024   Iron and TIBC ([facility] ONLY)    Standing Status:   Future    Expected Date:   05/02/2024    Expiration Date:   12/07/2024   Ferritin    Standing Status:   Future    Expected Date:   05/02/2024    Expiration Date:   12/07/2024      LILLETTE Hummingbird R Teague,acting as a scribe for Alean Stands, MD.,have documented all relevant documentation on the behalf of Alean Stands, MD,as directed by  Alean Stands, MD while in the presence of Alean Stands, MD.  I, Alean Stands MD, have reviewed the above documentation for accuracy and completeness, and I agree with the above.     Alean Stands, MD   1/7/20254:12 PM  CHIEF COMPLAINT:   Diagnosis: 3A follicular lymphoma, stage III    Cancer Staging  Follicular lymphoma grade 3a (HCC) Staging form: Hodgkin and Non-Hodgkin Lymphoma, AJCC 8th Edition - Clinical stage from 12/23/2022: Stage III (Follicular lymphoma) - Signed by Stands Alean, MD on 12/23/2022    Prior Therapy: none  Current Therapy:  Bendamustine  and rituximab     HISTORY OF PRESENT ILLNESS:   Oncology History  Follicular lymphoma grade 3a (HCC)  12/23/2022 Initial Diagnosis   Follicular lymphoma grade 3a (HCC)   12/23/2022 Cancer Staging   Staging form: Hodgkin and Non-Hodgkin Lymphoma, AJCC 8th Edition - Clinical stage from 12/23/2022: Stage III (Follicular lymphoma) - Signed by Stands Alean, MD on 12/23/2022 Histopathologic type: Follicular lymphoma, grade 3 Stage prefix: Initial diagnosis   01/07/2023 -  Chemotherapy   Patient is on Treatment Plan : NON-HODGKINS LYMPHOMA Rituximab  D1 + Bendamustine  D1,2 q28d x 6 cycles        INTERVAL HISTORY:   Alejandro [name] is a 83 y.o. male presenting to clinic today for  follow up of 3A follicular lymphoma, stage III. He was last seen by me on 07/20/23.  Since his last visit, he presented to the ED on 08/07/23 for HFrEF and was given IV lasix . He was admitted to the hospital from 08/17/23 to 08/18/23 for acute respiratory failure with hypoxia due to idiopathic pulmonary fibrosis. Symptoms improved with diuresis.   He underwent CT C/A/P on 11/30/23 that found: minimal progression of mediastinal lymphadenopathy and new prominent right axillary lymph node, nonspecific possibly reflecting early mild lymphomatous disease progression; similar bulky confluence nodal tissue in the retroperitoneum; no new pathologically enlarged or enlarging abdominopelvic lymph nodes identified; and no splenomegaly.  Today, he states that he is doing well overall. His appetite level is at 100%. His energy level is at 0%. He is accompanied by his daughter and grandson on the phone.  He notes a normal appetite. He has not had any recent immunizations.  He denies any fevers, night sweats, or unexpected weight loss. He has a a dog at home and has a recent scratch on his right arm.   He reports dyspnea on exertion, particularly when walking short distances. He has seen a pulmonologist who did not find any issues, as it is likely a side effect of CHF.   He would like his port removed as it is causing irritation.   PAST MEDICAL HISTORY:   Past Medical History: Past Medical History:  Diagnosis Date   A-fib (HCC)    AAA Ultrasound 01/11/2020    AAA US  01/11/20:  no abdominal aortic aneurysm, 2.4 cm   Aortic insufficiency    a. mild-mod by intraop TEE 05/2018 (no aortic stenosis) // Echo 2/21: mild //  Echo 9/22: trivial   Aortic stenosis    Echo 9/22: mild (mean 16) // Echo 2/21: mild (mean 18.7)   CAD (coronary artery disease)    a. NSTEMI 05/2018 with multivessel disease -> s/p CABGx4 05/11/18 // Myoview  06/07/20: inf scar w mild peri-infarct ischemia, EF 37; Int Risk   Cancer (HCC)    Carotid  artery disease (HCC)    PreCABG US  05/07/18: Bilat ICA 1-39   CKD (chronic kidney disease), stage III (HCC)    HFimpEF (heart failure with improved EF)    Echocardiogram 01/11/20: EF 45, Gr 3 DD, mild AI, mild AS (mean 18.7 mmHg) // Echocardiogram 08/14/21:  EF 55-60, inf HK, mild LVH, Gr 1 DD, normal RVSF, RVSP 25.3, mild LAE, trivial MR, MAC, trivial AI, mild AS (mean 16 mmHg, Vmax 283 cm/s, DI 0.41)   History of nephrolithiasis 2004   Hyperlipidemia    Hypertension    IBS (irritable colon syndrome)    Ischemic cardiomyopathy    Mitral regurgitation    Echo 9/22: trivial MR   Postoperative atrial fibrillation (HCC)    PVC's (premature ventricular contractions)    Reflux    Type 2 diabetes mellitus (HCC)     Surgical History: Past Surgical History:  Procedure Laterality Date   BRONCHIAL NEEDLE ASPIRATION BIOPSY  11/17/2022   Procedure: BRONCHIAL NEEDLE ASPIRATION BIOPSIES;  Surgeon: Shelah Lamar RAMAN, MD;  Location: [facility] ENDOSCOPY;  Service: Cardiopulmonary;;   CARDIOVERSION     CATARACT EXTRACTION W/PHACO Right 07/13/2023   Procedure: CATARACT EXTRACTION PHACO AND INTRAOCULAR LENS PLACEMENT (IOC) WITH PLACEMENT OF CORTICOSTEROID;  Surgeon: Harrie Agent, MD;  Location: [facility] ORS;  Service: Ophthalmology;  Laterality: Right;  CDE 17.32   CORONARY ARTERY BYPASS GRAFT N/A 05/11/2018   Procedure: CORONARY ARTERY BYPASS GRAFTING (CABG), on pump, times four, using left internal mammary artery and endoscopically harvested right greater saphenous leg vein.;  Surgeon: Fleeta Hanford Coy, MD;  Location: [facility] OR;  Service: Open Heart Surgery;  Laterality: N/A;   ESOPHAGOGASTRODUODENOSCOPY  02/04/2012   Procedure: ESOPHAGOGASTRODUODENOSCOPY (EGD);  Surgeon: Claudis RAYMOND Rivet, MD;  Location: [facility] ENDO SUITE;  Service: Endoscopy;  Laterality: N/A;  100   INGUINAL LYMPH NODE BIOPSY Left 12/15/2022   Procedure: INGUINAL LYMPH NODE BIOPSY;  Surgeon: Evonnie Dorothyann LABOR, DO;  Location: [facility] ORS;  Service: General;   Laterality: Left;   LEFT HEART CATH AND CORONARY ANGIOGRAPHY N/A 05/04/2018   Procedure: LEFT HEART CATH AND CORONARY ANGIOGRAPHY;  Surgeon: Claudene Victory ORN, MD;  Location: [facility] INVASIVE CV LAB;  Service: Cardiovascular;  Laterality: N/A;   NASAL FRACTURE SURGERY     PORTACATH PLACEMENT Right 01/06/2023   Procedure: INSERTION PORT-A-CATH;  Surgeon: Evonnie Dorothyann LABOR, DO;  Location: [facility] ORS;  Service: General;  Laterality: Right;   TEE WITHOUT CARDIOVERSION N/A 05/11/2018   Procedure: TRANSESOPHAGEAL ECHOCARDIOGRAM (TEE);  Surgeon: Fleeta Ochoa, Maude, MD;  Location: [facility] OR;  Service: Open Heart Surgery;  Laterality: N/A;   VIDEO BRONCHOSCOPY WITH ENDOBRONCHIAL ULTRASOUND Right 11/17/2022   Procedure: VIDEO BRONCHOSCOPY WITH ENDOBRONCHIAL ULTRASOUND;  Surgeon: Shelah Lamar RAMAN, MD;  Location: [facility] ENDOSCOPY;  Service: Cardiopulmonary;  Laterality: Right;    Social History: Social History   Socioeconomic History   Marital status: Widowed    Spouse name: Not on file   Number of children: Not on file   Years of education: Not on file   Highest education level: Not on file  Occupational History   Not on file  Tobacco Use   Smoking status: Never   Smokeless tobacco: Never  Vaping Use   Vaping status: Never Used  Substance and Sexual Activity   Alcohol  use: No    Alcohol /week: 0.0 standard drinks of alcohol    Drug use: No   Sexual activity: Yes  Other Topics Concern   Not on file  Social History Narrative   Not on file   Social Drivers of Health   Financial Resource Strain: Low Risk  (05/29/2023)   Received from [facility]   Overall Financial Resource Strain (CARDIA)    Difficulty of Paying Living Expenses: Not hard at all  Food Insecurity: No Food Insecurity (08/17/2023)   Hunger Vital Sign    Worried About Running Out of Food in the Last Year: Never true    Ran Out of Food in the Last Year: Never true  Transportation Needs: No Transportation Needs (08/17/2023)    PRAPARE - Administrator, Civil Service (Medical): No    Lack of Transportation (Non-Medical): No  Physical Activity: Unknown (05/29/2023)   Received from [facility]   Exercise Vital Sign    Days of Exercise per Week: 0 days    Minutes of Exercise per Session: Not on file  Stress: Stress Concern Present (06/12/2023)   Received from [facility] - Occupational Stress Questionnaire    Feeling of Stress : To some extent  Social Connections: Socially Integrated (05/29/2023)   Received from [facility], [facility]   Social Network    How would you rate your social network (family, work, friends)?: Good participation with social networks  Intimate Partner Violence: Not At Risk (08/17/2023)   Humiliation, Afraid, Rape, and Kick questionnaire    Fear of Current or Ex-Partner: No    Emotionally Abused: No    Physically Abused: No    Sexually Abused: No    Family History: Family History  Problem Relation Age of Onset   Diabetes Mellitus II Mother    Heart attack Father    Heart attack Brother    Heart disease Other    Arthritis Other    Asthma Other    Diabetes Other     Current Medications:  Current Outpatient Medications:    acetaminophen  (TYLENOL ) 500 MG tablet, Take 1,000 mg by mouth every 6 (six) hours as needed for moderate pain or headache., Disp: , Rfl:    albuterol  (ACCUNEB ) 1.25 MG/3ML nebulizer solution, Take 1 ampule by nebulization every 6 (six) hours as needed for wheezing or shortness of breath., Disp: , Rfl:    albuterol  (VENTOLIN  HFA) 108 (90 Base) MCG/ACT inhaler, Inhale 2 puffs into the lungs every 6 (six) hours as needed for wheezing or  shortness of breath., Disp: , Rfl:    allopurinol  (ZYLOPRIM ) 300 MG tablet, Take 1 tablet (300 mg total) by mouth daily., Disp: 30 tablet, Rfl: 3   aluminum-magnesium  hydroxide 200-200 MG/5ML suspension, Take 15 mLs by mouth every 6 (six) hours as needed for  indigestion., Disp: , Rfl:    apixaban  (ELIQUIS ) 5 MG TABS tablet, Take 1 tablet (5 mg total) by mouth 2 (two) times daily., Disp: 60 tablet, Rfl: 0   atorvastatin  (LIPITOR ) 40 MG tablet, Take 40 mg by mouth at bedtime., Disp: , Rfl:    cyanocobalamin  (VITAMIN B12) 1000 MCG tablet, Take 1,000 mcg by mouth daily., Disp: , Rfl:    cycloSPORINE  (RESTASIS ) 0.05 % ophthalmic emulsion, Place 1 drop into both eyes 2 (two) times daily., Disp: , Rfl:    docusate sodium  (COLACE) 100 MG capsule, Take 300 mg by mouth daily as needed for mild constipation., Disp: , Rfl:    famotidine  (PEPCID ) 20 MG tablet, One after supper, Disp: 30 tablet, Rfl: 11   fluticasone  (FLONASE ) 50 MCG/ACT nasal spray, Place 1 spray into both nostrils daily., Disp: 11.1 mL, Rfl: 2   furosemide  (LASIX ) 20 MG tablet, Take 3 tablets (60 mg total) by mouth daily., Disp: 90 tablet, Rfl: 11   glipiZIDE (GLUCOTROL XL) 10 MG 24 hr tablet, Take 10 mg by mouth daily., Disp: , Rfl:    ipratropium (ATROVENT) 0.06 % nasal spray, Place 2 sprays into both nostrils 2 (two) times daily., Disp: , Rfl:    loratadine  (CLARITIN ) 10 MG tablet, Take 10 mg by mouth daily., Disp: , Rfl:    Melatonin 10 MG CAPS, Take 30 mg by mouth at bedtime as needed (sleep)., Disp: , Rfl:    metFORMIN (GLUCOPHAGE-XR) 750 MG 24 hr tablet, Take 1 tablet by mouth 2 (two) times daily with a meal., Disp: , Rfl:    metoprolol  succinate (TOPROL  XL) 25 MG 24 hr tablet, Take 1 tablet (25 mg total) by mouth daily., Disp: 30 tablet, Rfl: 0   pantoprazole  (PROTONIX ) 40 MG tablet, Take 1 tablet (40 mg total) by mouth daily. Take 30-60 min before first meal of the day, Disp: 30 tablet, Rfl: 2   potassium chloride  (MICRO-K ) 10 MEQ CR capsule, Take 10 mEq by mouth daily., Disp: , Rfl:    predniSONE  (DELTASONE ) 10 MG tablet, 4 each am x 2 days. 2 each am x 2 days. 1 each am x 2 days. stop, Disp: 14 tablet, Rfl: 0   sacubitril -valsartan  (ENTRESTO ) 49-51 MG, Take 1 tablet by mouth 2 (two)  times daily., Disp: , Rfl:    SYMBICORT 160-4.5 MCG/ACT inhaler, Inhale 2 puffs into the lungs 2 (two) times daily., Disp: , Rfl:    Allergies: No Known Allergies  REVIEW OF SYSTEMS:   Review of Systems  Constitutional:  Negative for chills, fatigue and fever.  HENT:   Negative for lump/mass, mouth sores, nosebleeds, sore throat and trouble swallowing.   Eyes:  Negative for eye problems.  Respiratory:  Negative for cough and shortness of breath.   Cardiovascular:  Negative for chest pain, leg swelling and palpitations.  Gastrointestinal:  Negative for abdominal pain, constipation, diarrhea, nausea and vomiting.  Genitourinary:  Negative for bladder incontinence, difficulty urinating, dysuria, frequency, hematuria and nocturia.   Musculoskeletal:  Negative for arthralgias, back pain, flank pain, myalgias and neck pain.  Skin:  Negative for itching and rash.  Neurological:  Negative for dizziness, headaches and numbness.       +tingling feet  Hematological:  Does not bruise/bleed easily.  Psychiatric/Behavioral:  Negative for depression, sleep disturbance and suicidal ideas. The patient is not nervous/anxious.   All other systems reviewed and are negative.    VITALS:   Blood pressure (!) 144/71, pulse 76, temperature (!) 97.5 F (36.4 C), resp. rate 16, weight 245 lb 2.4 oz (111.2 kg), SpO2 95%.  Wt Readings from Last 3 Encounters:  12/08/23 245 lb 2.4 oz (111.2 kg)  09/15/23 240 lb (108.9 kg)  08/18/23 236 lb 8.9 oz (107.3 kg)    Body mass index is 34.19 kg/m.  Performance status (ECOG): 2 - Symptomatic, <50% confined to bed  PHYSICAL EXAM:   Physical Exam Vitals and nursing note reviewed. Exam conducted with a chaperone present.  Constitutional:      Appearance: Normal appearance.  Cardiovascular:     Rate and Rhythm: Normal rate and regular rhythm.     Pulses: Normal pulses.     Heart sounds: Normal heart sounds.  Pulmonary:     Effort: Pulmonary effort is normal.      Breath sounds: Normal breath sounds.  Abdominal:     Palpations: Abdomen is soft. There is no hepatomegaly, splenomegaly or mass.     Tenderness: There is no abdominal tenderness.  Musculoskeletal:     Right lower leg: No edema.     Left lower leg: No edema.  Lymphadenopathy:     Cervical: No cervical adenopathy.     Right cervical: No superficial, deep or posterior cervical adenopathy.    Left cervical: No superficial, deep or posterior cervical adenopathy.     Upper Body:     Right upper body: No supraclavicular or axillary adenopathy.     Left upper body: No supraclavicular or axillary adenopathy.  Neurological:     General: No focal deficit present.     Mental Status: He is alert and oriented to person, place, and time.  Psychiatric:        Mood and Affect: Mood normal.        Behavior: Behavior normal.     LABS:      Latest Ref Rng & Units 11/30/2023   12:23 PM 09/15/2023   10:57 AM 08/18/2023    5:44 AM  CBC  WBC 4.0 - 10.5 K/uL 11.0  11.4  10.0   Hemoglobin 13.0 - 17.0 g/dL 87.5  88.2  89.6   Hematocrit 39.0 - 52.0 % 37.4  36.1  31.8   Platelets 150 - 400 K/uL 210  264  210       Latest Ref Rng & Units 11/30/2023   12:23 PM 09/15/2023   10:57 AM 08/18/2023    5:44 AM  CMP  Glucose 70 - 99 mg/dL 789  843  842   BUN 8 - 23 mg/dL 15  17  12    Creatinine 0.61 - 1.24 mg/dL 8.97  8.94  9.13   Sodium 135 - 145 mmol/L 136  141  136   Potassium 3.5 - 5.1 mmol/L 3.5  4.1  3.6   Chloride 98 - 111 mmol/L 101  100  101   CO2 22 - 32 mmol/L 25  23  25    Calcium  8.9 - 10.3 mg/dL 8.9  9.2  8.4   Total Protein 6.5 - 8.1 g/dL 7.3     Total Bilirubin 0.0 - 1.2 mg/dL 0.7     Alkaline Phos 38 - 126 U/L 88     AST 15 - 41 U/L 21  ALT 0 - 44 U/L 23        No results found for: CEA1, CEA / No results found for: CEA1, CEA No results found for: PSA1 No results found for: CAN199 No results found for: CAN125  No results found for: TOTALPROTELP,  ALBUMINELP, A1GS, A2GS, BETS, BETA2SER, GAMS, MSPIKE, SPEI Lab Results  Component Value Date   TIBC 361 02/04/2023   FERRITIN 47 02/04/2023   IRONPCTSAT 13 (L) 02/04/2023   Lab Results  Component Value Date   LDH 215 (H) 07/20/2023   LDH 146 04/14/2023   LDH 129 04/01/2023     STUDIES:   CT CHEST ABDOMEN PELVIS W CONTRAST Result Date: 11/30/2023 CLINICAL DATA:  Grade 3A follicular lymphoma, assess treatment response. * Tracking Code: BO * EXAM: CT CHEST, ABDOMEN, AND PELVIS WITH CONTRAST TECHNIQUE: Multidetector CT imaging of the chest, abdomen and pelvis was performed following the standard protocol during bolus administration of intravenous contrast. RADIATION DOSE REDUCTION: This exam was performed according to the departmental dose-optimization program which includes automated exposure control, adjustment of the mA and/or kV according to patient size and/or use of iterative reconstruction technique. CONTRAST:  OMNIPAQUE  IOHEXOL  300 MG/ML  SOLN COMPARISON:  Multiple priors including PET-CT Apr 09, 2023 CTA chest August 16, 2023. FINDINGS: CT CHEST FINDINGS Cardiovascular: Accessed right chest Port-A-Cath with tip at the superior cavoatrial junction. Aortic atherosclerosis. Prior median sternotomy and CABG with dense calcifications of the native coronary vessels. Borderline cardiac enlargement. No central pulmonary embolus on this nondedicated study. Mediastinum/Nodes: No suspicious thyroid  nodule. Prominent mediastinal lymph nodes are minimally progressed from prior PET-CT for instance a high right paratracheal lymph node measures 7 mm in short axis on image 22/2 previously 5 mm. And a subcarinal lymph node measures 10 mm in short axis on image 31/2 previously 7 mm. New prominent right axillary lymph node measures 7 mm in short axis on image 16/2. Lungs/Pleura: Similar subpleural and linear bands of scarring. No suspicious pulmonary nodules or masses. Musculoskeletal:  Prior median sternotomy. No aggressive lytic or blastic lesion of bone. Multilevel degenerative changes spine. Degenerative change of the bilateral shoulders. CT ABDOMEN PELVIS FINDINGS Hepatobiliary: No suspicious hepatic lesion. Gallbladder is unremarkable. No biliary ductal dilation. Pancreas: No pancreatic ductal dilation or evidence of acute inflammation. Spleen: No splenomegaly. Adrenals/Urinary Tract: Bilateral adrenal glands appear normal. No hydronephrosis. Kidneys demonstrate symmetric enhancement. Urinary bladder is unremarkable for degree of distension. Stomach/Bowel: Radiopaque enteric contrast material traverses distal loops of small bowel. Stomach is unremarkable for degree of distension. No pathologic dilation of small or large bowel. Colon is decompressed limiting evaluation. Scattered colonic diverticulosis. No evidence of acute bowel inflammation. Vascular/Lymphatic: Aortic atherosclerosis. Normal caliber abdominal aorta. The portal, splenic and superior mesenteric veins are patent. Similar bulky confluence nodal tissue in the retroperitoneum wrapping behind the aorta and between the aorta and IVC measuring up to 16 mm in thickness on image 76/2, unchanged. Similar soft tissue nodularity/stranding along the anterior aorta near the level of the IMA measuring 6 mm in maximal thickness on image 84/2. No new pathologically enlarged or enlarging abdominopelvic lymph nodes identified. Reproductive: Prostate gland is unremarkable. Other: No significant abdominopelvic free fluid. Sequela of subcutaneous injections in the posterior gluteal tissues. Musculoskeletal: No aggressive lytic or blastic lesion of bone. Multilevel degenerative change of the spine. IMPRESSION: 1. Minimal progression of mediastinal lymphadenopathy and new prominent right axillary lymph node, nonspecific possibly reflecting early mild lymphomatous disease progression. 2. Similar bulky confluence nodal tissue in the retroperitoneum.  No  new pathologically enlarged or enlarging abdominopelvic lymph nodes identified. 3. No splenomegaly. 4.  Aortic Atherosclerosis (ICD10-I70.0). Electronically Signed   By: Reyes Holder M.D.   On: 11/30/2023 14:55

## 2023-12-08 NOTE — Patient Instructions (Addendum)
 [facility] at [facility] Discharge Instructions   You were seen and examined today by Dr. Rogers.  He reviewed the results of your lab work which are normal/stable.   He reviewed the results of your CT scan. It is mainly normal/stable. There is a new, small lymph node showing under the right arm. Dr. MARLA is not concerned too much about this at this time. We will just continue to monitor this.   We will see you back in 6 months. We will repeat lab work and CT scan prior to this visit.  Return as scheduled.    Thank you for choosing [facility] at [facility] to provide your oncology and hematology care.  To afford each patient quality time with our provider, please arrive at least 15 minutes before your scheduled appointment time.   If you have a lab appointment with the Cancer Center please come in thru the Main Entrance and check in at the main information desk.  You need to re-schedule your appointment should you arrive 10 or more minutes late.  We strive to give you quality time with our providers, and arriving late affects you and other patients whose appointments are after yours.  Also, if you no show three or more times for appointments you may be dismissed from the clinic at the providers discretion.     Again, thank you for choosing [facility].  Our hope is that these requests will decrease the amount of time that you wait before being seen by our physicians.       _____________________________________________________________  Should you have questions after your visit to [facility], please contact our office at [phone] and follow the prompts.  Our office hours are 8:00 a.m. and 4:30 p.m. Monday - Friday.  Please note that voicemails left after 4:00 p.m. may not be returned until the following business day.  We are closed weekends and major holidays.  You do have access to a nurse 24-7, just call the  main number to the clinic [phone] and do not press any options, hold on the line and a nurse will answer the phone.    For prescription refill requests, have your pharmacy contact our office and allow 72 hours.    Due to Covid, you will need to wear a mask upon entering the hospital. If you do not have a mask, a mask will be given to you at the Main Entrance upon arrival. For doctor visits, patients may have 1 support person age 12 or older with them. For treatment visits, patients can not have anyone with them due to social distancing guidelines and our immunocompromised population.

## 2023-12-09 ENCOUNTER — Other Ambulatory Visit: Payer: Self-pay

## 2023-12-14 ENCOUNTER — Ambulatory Visit: Payer: Medicare Other | Admitting: Hematology

## 2024-01-11 ENCOUNTER — Encounter: Payer: Self-pay | Admitting: Hematology

## 2024-01-18 ENCOUNTER — Ambulatory Visit: Payer: HMO | Admitting: Internal Medicine

## 2024-03-22 NOTE — H&P (Signed)
 Surgical History & Physical  Patient Name: Alejandro [name]  DOB: Oct 06, 1941  Surgery: Cataract extraction with intraocular lens implant phacoemulsification; Left Eye Surgeon: Tarri Farm MD Surgery Date: 03/28/2024 Pre-Op Date: 02/01/2024  HPI: A 52 Yr. old male patient 1. The patient is present today for Cataract Evaluation. The patient complains of difficulty when reading fine print, books, newspaper, instructions etc., which began 1 year ago. The left eye is affected. The episode is constant. The patient describes aching symptoms affecting their eyes/vision. The complaint is associated with blurry vision. This is negatively affecting the patient's quality of life and the patient is unable to function adequately in life with the current level of vision. HPI was performed by Tarri Farm .  Medical History: Cataracts diabetic retinopathy  Cancer Diabetes Heart Problem High Blood Pressure LDL Lung Problems allergies, GERD, chronic kidney disease  Review of Systems Allergic/Immunologic Seasonal Allergies Cardiovascular High Blood Pressure Endocrine diabetic All recorded systems are negative except as noted above.  Social Never smoked   Medication Ivizia, Prednisolone-Moxifloxacin-Bromfenac,  Albuterol , Eliquis , Atorvastatin , Flonase , Furosemide , Glipizide, Loratadine , Metformin, Oxycodone , Protonix , Compazine , Entresto , Spironolactone   Sx/Procedures Phaco c IOL OD with Dextenza ,  Open Heart Sx x4  Drug Allergies  NKDA  History & Physical: Heent: cataract NECK: supple without bruits LUNGS: lungs clear to auscultation CV: regular rate and rhythm Abdomen: soft and non-tender  Impression & Plan: Assessment: 1.  NUCLEAR SCLEROSIS AGE RELATED; , Left Eye (H25.12) 2.  CORNEAL SCAR PERIPHERAL; Left Eye [phone]) 3.  INTRAOCULAR LENS IOL (Z96.1)  Plan: 1.  Left Eye. Cataract accounts for the patient's decreased vision. This visual impairment is not correctable with a  tolerable change in glasses or contact lenses. Cataract surgery with an implantation of a new lens should significantly improve the visual and functional status of the patient. Discussed all risks, benefits, alternatives, and potential complications. Discussed the procedures and recovery. Patient desires to have surgery. A-scan ordered and performed today for intra-ocular lens calculations. The surgery will be performed in order to improve vision for driving, reading, and for eye examinations. Recommend phacoemulsification with intra-ocular lens. Recommend Dextenza  for post-operative pain and inflammation. Surgery required to correct imbalance of vision. Dilates poorly - shugarcaine by protocol. Will want to operate superiorly due to corneal scarring. Malyugin Ring. Omidira. Will want to do surgery superiorly.  2.  From remote trauma. Will need to perform cataract surgery superiorly to avoid the dense temporal scarring.  3.  Stable. Doing well since surgery

## 2024-03-23 ENCOUNTER — Encounter (HOSPITAL_COMMUNITY)
Admission: RE | Admit: 2024-03-23 | Discharge: 2024-03-23 | Disposition: A | Discharge status: Home / Self Care | Source: Ambulatory Visit | Attending: Ophthalmology | Admitting: Ophthalmology

## 2024-03-23 ENCOUNTER — Encounter (HOSPITAL_COMMUNITY): Payer: Self-pay

## 2024-03-28 ENCOUNTER — Encounter (HOSPITAL_COMMUNITY): Admission: RE | Payer: Self-pay | Source: Home / Self Care

## 2024-03-28 ENCOUNTER — Ambulatory Visit (HOSPITAL_COMMUNITY): Admission: RE | Admit: 2024-03-28 | Source: Home / Self Care | Admitting: Ophthalmology

## 2024-03-28 SURGERY — CATARACT EXTRACTION PHACO AND INTRAOCULAR LENS PLACEMENT (IOC) with placement of Corticosteroid
Anesthesia: Monitor Anesthesia Care | Laterality: Left

## 2024-06-01 ENCOUNTER — Encounter (HOSPITAL_COMMUNITY)

## 2024-06-01 ENCOUNTER — Ambulatory Visit (HOSPITAL_COMMUNITY): Payer: PPO

## 2024-06-01 ENCOUNTER — Inpatient Hospital Stay: Payer: PPO

## 2024-06-02 ENCOUNTER — Encounter (HOSPITAL_COMMUNITY)

## 2024-06-02 ENCOUNTER — Other Ambulatory Visit: Payer: Self-pay

## 2024-06-06 ENCOUNTER — Encounter (HOSPITAL_COMMUNITY)
Admission: RE | Admit: 2024-06-06 | Discharge: 2024-06-06 | Disposition: A | Discharge status: Home / Self Care | Source: Ambulatory Visit | Attending: Cardiothoracic Surgery | Admitting: Cardiothoracic Surgery

## 2024-06-06 DIAGNOSIS — Z952 Presence of prosthetic heart valve: Secondary | ICD-10-CM | POA: Insufficient documentation

## 2024-06-06 NOTE — Progress Notes (Signed)
 Completed virtual orientation today.  EP evaluation is scheduled for 7/14 at 0800 .  Documentation for diagnosis can be found in [facility] encounter 05/17/24.

## 2024-06-07 ENCOUNTER — Inpatient Hospital Stay: Payer: PPO | Admitting: Hematology

## 2024-06-13 ENCOUNTER — Encounter (HOSPITAL_COMMUNITY): Admission: RE | Admit: 2024-06-13 | Source: Ambulatory Visit

## 2024-06-15 NOTE — Progress Notes (Signed)
 Primary Care Provider:  Charmaine Heller, NP Primary Cardiologist:  Badal Primary Electrophysiologist: N/a Primary SH: Turner    Chief complaint:  TAVR 30-day follow-up  Problem List:  S/p TAVR with 29 mm Edwards Sapien S3 Resilia valve (05/17/24) CAD s/p CABG x4 (05/2018, [facility]) LHC (06/2023): MVD with RCA CTO, chronic LAD CTO, OM CTO, nonobstructive disease otherwise, 3/4 bypass grafts patent (occluded SVG-OM) ICM/CHFpEF TTE (05/2023): EF 35-40% TTE (05/2024): EF 45-50% Pulmonary HTN, severe Paroxysmal AFib S/p DCCV (08/2022, [facility]) AC on Eliquis  5 mg BID HTN HLD T2DM CKD, stage 3 Pulmonary Fibrosis Chronic Respiratory Failure GERD BPH Follicular Lymphoma Lyme Disease    HPI:  Alejandro [name] is a 83 y.o. male who presents today for a 30-day transcatheter aortic valve replacement follow-up. Briefly, the patient underwent successful transcatheter aortic valve replacement with 29 mm Edwards Sapien S3 Resilia valve via R TF approach on 05/17/24 by Dr. Shlomo and Dr. Joline. Postoperative course was uncomplicated.   Postoperative echocardiogram showed LVEF 45-50% well seated TAVR functions normally with peak and mean gradients across the AoV of 16 and 7 mmHg respectively, trace paravalvular AI. Repeat echo completed today, but has not been finalized at this time.   Patient is here today with his son.  He reports overall he has been doing well.  He denies any chest discomfort or tach palpitations.  He states that his breathing has been stable however continues to wear 3 L nasal cannula.  Does have a history of pulmonary fibrosis.  States that since TAVR he has noticed that he is able to walk further.  He denies any orthopnea, PND, edema, or weight gain.  Son notes that weights have been consistently around 249-250 lb.   He has not had any recent hospitalizations in the past 30 days. He is doing well, and is advancing activity gradually.  He has started walking at his house.  He is  interested in cardiac rehabilitation at this time. Hehas tolerated Eliquis  5 mg BID (PAF) well without issues of frank or occult bleeding.    GDMT: Entresto  49-51 mg BID, Toprol  XL 25 mg QD, Jardiance 10 mg QD Lasix  60 mg QD with extra 20 mg in evening.    NHYA Class: II  Allergies[1]     Medication Sig Dispense Refill  . acetaminophen  (TYLENOL ) 500 mg tablet Take two tablets (1,000 mg dose) by mouth every 6 (six) hours as needed.    . albuterol  (ACCUNEB ) 1.25 MG/3ML nebulizer solution Take 3 mLs (1.25 mg dose) by nebulization every 6 (six) hours as needed for Wheezing. 720 mL 1  . albuterol  sulfate HFA (PROVENTIL ,VENTOLIN ,PROAIR ) 108 (90 Base) MCG/ACT inhaler INHALE 2 PUFFS INTO THE LUNGS EVERY 6 HOURS AS NEEDED FOR WHEEZING OR SHORTNESS OF BREATH *NEW PRESCRIPTION REQUEST* 25.5 g 10  . allopurinol  (ZYLOPRIM ) 300 mg tablet Take one tablet (300 mg dose) by mouth daily. 90 tablet 0  . apixaban  (ELIQUIS ) 5 mg tablet Take one tablet (5 mg dose) by mouth 2 (two) times daily. 180 tablet 0  . aspirin  81 mg chewable tablet Chew one tablet (81 mg dose) by mouth daily. 30 tablet 6  . atorvastatin  (LIPITOR ) 40 mg tablet TAKE 1 TABLET(S) BY MOUTH 1 TIMES PER DAY 90 tablet 10  . azelastine (ASTELIN) 0.1 % nasal spray two sprays 2 (two) times daily. (Patient not taking: Reported on 06/16/2024)    . bisacodyl  (BISACODYL ,DULCOLAX) 5 mg EC tablet Take one tablet (5 mg dose) by mouth daily as needed for Constipation.    SABRA  budesonide-formoterol (SYMBICORT) 160-4.5 mcg/actuation inhaler Inhale two puffs into the lungs 2 (two) times daily. 30.6 g 0  . busPIRone (BUSPAR) 5 mg tablet Take one tablet (5 mg dose) by mouth 2 (two) times a day as needed.    . cyanocobalamin  (VITAMIN B-12) 1000 mcg/mL injection INJECT 1 ML INTO THE SKIN EVERY 30 DAYS *NEW PRESCRIPTION REQUEST* 3 mL 10  . cycloSPORINE  (RESTASIS ) 0.05 % ophthalmic emulsion Place one drop into both eyes 2 (two) times daily. 0 30 each 0  . docusate sodium   (COLACE,DOK,DOCQLACE) capsule Take one capsule (100 mg dose) by mouth daily as needed for Constipation.    . empagliflozin (JARDIANCE) 10 mg TABS tablet Take one tablet (10 mg dose) by mouth daily. 30 tablet 11  . famotidine  (PEPCID ) 20 mg tablet Take one tablet (20 mg dose) by mouth 2 (two) times daily. 180 tablet 0  . furosemide  (LASIX ) 20 mg tablet Take one tablet (20 mg dose) by mouth 3 (three) times a day. 30 tablet 0  . furosemide  (LASIX ) 20 mg tablet Take one tablet (20 mg dose) by mouth daily. In addition to his 60 mg daily. 7 tablet 0  . glipiZIDE ER (GLUCOTROL XL) 10 mg 24 hr tablet TAKE 1 TABLET BY MOUTH TWICE DAILY 90 tablet 11  . glucose blood test strip Check blood sugar twice a day 100 each 5  . ipratropium (ATROVENT) 0.06% nasal spray INSTILL TWO (2) SPRAYS INTRANASALLY TWICE DAILY 15 mL 2  . Melatonin 10 MG CAPS TAKE 1 TABLET(S) BY MOUTH 1 TIMES PER DAY *NEW PRESCRIPTION REQUEST* 90 capsule 10  . metFORMIN ER (GLUCOPHAGE-XR) 750 MG 24 hr tablet Take one tablet (750 mg dose) by mouth 2 (two) times daily. 180 tablet 0  . metoprolol  succinate (TOPROL -XL) 25 mg 24 hr tablet Take one tablet (25 mg dose) by mouth daily. 90 tablet 0  . Misc. Devices MISC Home DME nebulizer machine per insurance coverage to use every 8 hrs as needed for chronic dyspnea 1 each 0  . Misc. Devices MISC POC set at 2 lpm pulse dose  Dx: Lymphoma with decreased muscle tone/mobility 1 each 0  . Misc. Devices MISC Portable tank: 2 liters nasal cannula at all times (Patient taking differently: Portable tank: 3 liters nasal cannula at all times, sometimes 3.5 L) 1 each 0  . Misc. Devices MISC Lightweight wheelchair  Rx CHF; Acute respiratory failure, decreased mobiligy 1 each 0  . Misc. Devices MISC Hospital bed 1 each 0  . Misc. Devices MISC Humidifier for oxygen 1 each 0  . montelukast (SINGULAIR) 10 MG tablet Take one tablet (10 mg dose) by mouth at bedtime. 90 tablet 0  . ONETOUCH ULTRASOFT LANCETS lancets  Check blood sugar twice a day 60 each 5  . OXYGEN Inhale into the lungs. 3-4 L [place] continuous    . pantoprazole  sodium (PROTONIX ) 40 mg tablet Take one tablet (40 mg dose) by mouth daily. 90 tablet 0  . potassium citrate (UROCIT-K) 10 mEq (1080 mg) SR tablet Take one tablet (10 mEq dose) by mouth daily. 90 tablet 0  . sacubitril -valsartan  (ENTRESTO ) 49-51 mg TABS per tablet Take one tablet by mouth 2 (two) times daily. 180 tablet 0  . vitamin B-12 (CYANOCOBALAMIN ) 1000 mcg tablet Take one tablet (1,000 mcg dose) by mouth daily.     No current facility-administered medications for this visit.    Tobacco Use History[2]  ROS:  All systems reviewed and negative except for the above.  Physical  Exam:  Vitals:   06/16/24 1108  BP: 110/74  Pulse: 77  SpO2: 97%  Weight: 250 lb (113.4 kg)  Height: 5' 10 (1.778 m)    Physical Exam  Constitutional: He appears healthy. No distress.  Neck: No JVD present.  Cardiovascular: Normal rate, regular rhythm, S1 normal, S2 normal, normal heart sounds and normal pulses.  No murmur heard. Pulmonary/Chest: Effort normal and breath sounds normal. He has no wheezes. He has no rales. He exhibits no tenderness.  Abdominal: Soft. He exhibits no distension. There is no abdominal tenderness.  Musculoskeletal:        General: No edema.  Neurological: He is alert and oriented to person, place, and time.  Skin: Skin is warm and dry.     Impression: 1. Coronary artery disease involving coronary bypass graft of native heart without angina pectoris   2. S/P CABG x 4   3. Severe aortic stenosis   4. S/p TAVR (transcatheter aortic valve replacement), bioprosthetic with 29 mm Edwards Sapien S3 Resilia valve (05/17/24)   5. Paroxysmal atrial fibrillation (*)   6. Hypertension associated with type 2 diabetes mellitus (*)   7. HFrEF (heart failure with reduced ejection fraction) (*)   8. Pulmonary fibrosis (*)   9. Chronic respiratory failure with hypoxia (*)      Plan:    S/p TAVR:  At this time Laurier Rosalita Elder appears to be in no distress.  Patient overall doing well from cardiac standpoint. Will notify patient of echo results when available.  He will continue on aspirin  81mg  daily. Endocarditis prophylaxis is recommended for any anticipated dental procedures or cleanings. Strongly encouraged participation in cardiac rehab.   CHFrEF: Last echo with mild improvement in EF (45-50%) post -TAVR. Repeat echo completed today, awaiting results.  Appears clinically euvolemic well-perfused.  Continue GDMT.   CAD s/p CABG: No anginal symptoms. Continue secondary prevention meds.  PAF: Remains rate controlled on metoprolol .  Continue Eliquis  for stroke prevention.  HTN: Well-controlled.  No changes to current medication regimen.  Dispo: Follow-up with primary cardiologist in 5 months and with [facility] team in 1 yr with repeat echo same day.    Patient was given ample opportunity to ask questions which were all addressed and agrees with the above listed plan.   Risk, benefits and alternatives of medication management dicussed with patient prior to discharge.   This note was dictated with voice recognition software. Similar sounding words can inadvertently get transcribed and not corrected upon review.        [1] No Known Allergies [2] Social History Tobacco Use  Smoking Status Never  . Passive exposure: Never  Smokeless Tobacco Never

## 2024-07-18 NOTE — Progress Notes (Signed)
 Subjective:   Alejandro [name] is a 83 y.o. male here for evaluation of    Chief Complaint  Patient presents with  . Anxiety    Mental stress. Patient states he just don't feel good. Patient complains of shortness of breath, fatigue, worrying, and congestion.   These are chronic complaints for patient. He is known to refuse CPAP at many times oxygen at night. 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 TAVR 2 months ago. Ef improved from 35-40 to 45-50%. Reported at Cardiology follow up being able to walk around more with reduced DOE.  Review of Systems - All other systems were reviewed and are negative unless stated in HPI.  Family History  Problem Relation Age of Onset  . Diabetes Mother   . Heart attack Father   . Heart attack Brother    Past Medical History:  Diagnosis Date  . Aortic insufficiency   . CAD (coronary artery disease)   . CHF (congestive heart failure) (*)   . CKD (chronic kidney disease)   . GERD (gastroesophageal reflux disease)   . Hyperlipidemia   . Hypertension   . IBS (irritable bowel syndrome)   . Myocardial infarction (*)   . Neuropathy   . Type 2 diabetes mellitus (*)    Past Surgical History:  Procedure Laterality Date  . Coronary angioplasty    . Coronary artery bypass graft    . Esophagogastroduodenoscopy    . Nasal fracture surgery     Pediatric History  Patient Parents  . Not on file   Other Topics Concern  . Not on file  Social History Narrative  . Not on file    Objective:   BP (!) 139/52 (BP Location: Left Upper Arm, Patient Position: Sitting)   Pulse 63   Temp 98.1 F (36.7 C) (Temporal)   Resp 20   Ht 5' 10 (1.778 m)   Wt 251 lb 12.8 oz (114.2 kg)   SpO2 98% Comment: with 3L of O2.  BMI 36.13 kg/m  Gen: Alert, oriented, non toxic, and well hydrated.  No signs of acute distress. Head: Normocephalic.  Atraumatic.  PERRLA.  Sclera anicteric. Maxillary sinus pressure  bilaterally. Eyes: Extraocular movements intact.  Conjunctiva clear.  No foreign bodies noted. Ears:  Tympanic membranes clear.  Canals clear. Pharynx: No erythema or tonsillar hypertrophy.  Uvula midline. Neck: Supple. No lymphadenopathy Respiratory:  Lungs clear to auscultation.  No use of accessory muscles. Cardiovascular: Regular rate and rhythm.  No murmurs noted Abdominal:  Soft, non tender, non distended.  No hepatosplenomegally Neuro: Cranial nerves intact grossly.  No loss of strength, sensation Extremities:  Full range of motion.  No cyanosis, clubbing, or edema. Skin:  No rashes noted Psych: Oriented, alert.  Assessment:     ICD-10-CM   1. Type 2 diabetes mellitus with stage 3a chronic kidney disease, without long-term current use of insulin  (*)  E11.22    N18.31     2. B12 deficiency  E53.8     3. Fatigue, unspecified type  R53.83       Plan:   - Take all medications as prescribed. - Return to clinic to be reevaluated if symptoms worsen, persist, change, or if you have any other concerns. - I discussed this diagnosis with the patient and discussed the treatment plan with them.  This treatment plan is also outlined in the Patient Instructions and a copy of this was provided to the patient.

## 2024-07-22 ENCOUNTER — Encounter: Payer: Self-pay | Admitting: Radiology

## 2024-07-26 ENCOUNTER — Other Ambulatory Visit: Payer: Self-pay | Admitting: *Deleted

## 2024-09-14 ENCOUNTER — Encounter (INDEPENDENT_AMBULATORY_CARE_PROVIDER_SITE_OTHER): Payer: Self-pay | Admitting: Gastroenterology

## 2024-10-03 ENCOUNTER — Encounter: Payer: Self-pay | Admitting: Radiology
